# Patient Record
Sex: Male | Born: 1945
Health system: Southern US, Community
[De-identification: ages and names within clinical notes are randomized; demographics above are authoritative.]

## PROBLEM LIST (undated history)

## (undated) DIAGNOSIS — Z9889 Other specified postprocedural states: Secondary | ICD-10-CM

## (undated) DIAGNOSIS — Z7901 Long term (current) use of anticoagulants: Secondary | ICD-10-CM

## (undated) DIAGNOSIS — E785 Hyperlipidemia, unspecified: Secondary | ICD-10-CM

## (undated) DIAGNOSIS — M109 Gout, unspecified: Secondary | ICD-10-CM

## (undated) DIAGNOSIS — I4891 Unspecified atrial fibrillation: Secondary | ICD-10-CM

## (undated) DIAGNOSIS — I10 Essential (primary) hypertension: Secondary | ICD-10-CM

## (undated) DIAGNOSIS — H905 Unspecified sensorineural hearing loss: Secondary | ICD-10-CM

## (undated) DIAGNOSIS — F0280 Dementia in other diseases classified elsewhere without behavioral disturbance: Secondary | ICD-10-CM

## (undated) DIAGNOSIS — C9000 Multiple myeloma not having achieved remission: Principal | ICD-10-CM

## (undated) DIAGNOSIS — I2510 Atherosclerotic heart disease of native coronary artery without angina pectoris: Secondary | ICD-10-CM

## (undated) DIAGNOSIS — E119 Type 2 diabetes mellitus without complications: Secondary | ICD-10-CM

## (undated) DIAGNOSIS — F419 Anxiety disorder, unspecified: Secondary | ICD-10-CM

## (undated) DIAGNOSIS — K219 Gastro-esophageal reflux disease without esophagitis: Secondary | ICD-10-CM

## (undated) DIAGNOSIS — K449 Diaphragmatic hernia without obstruction or gangrene: Secondary | ICD-10-CM

## (undated) HISTORY — PX: RETINAL LASER PROCEDURE: SHX2339

## (undated) HISTORY — DX: Hyperlipidemia, unspecified: E78.5

## (undated) HISTORY — DX: Atherosclerotic heart disease of native coronary artery without angina pectoris: I25.10

## (undated) HISTORY — PX: COCHLEAR IMPLANT: SUR684

## (undated) HISTORY — PX: PACEMAKER PLACEMENT: SHX43

## (undated) HISTORY — DX: Diaphragmatic hernia without obstruction or gangrene: K44.9

## (undated) HISTORY — DX: Dementia in other diseases classified elsewhere, unspecified severity, without behavioral disturbance, psychotic disturbance, mood disturbance, and anxiety: F02.80

## (undated) HISTORY — PX: CATARACT EXTRACTION: SUR2

## (undated) HISTORY — DX: Type 2 diabetes mellitus without complications: E11.9

## (undated) HISTORY — DX: Anxiety disorder, unspecified: F41.9

## (undated) HISTORY — DX: Unspecified atrial fibrillation: I48.91

## (undated) HISTORY — DX: Other specified postprocedural states: Z98.890

## (undated) HISTORY — DX: Multiple myeloma not having achieved remission: C90.00

## (undated) HISTORY — DX: Gastro-esophageal reflux disease without esophagitis: K21.9

## (undated) HISTORY — DX: Unspecified sensorineural hearing loss: H90.5

## (undated) HISTORY — DX: Gout, unspecified: M10.9

## (undated) HISTORY — DX: Long term (current) use of anticoagulants: Z79.01

## (undated) HISTORY — PX: HEMORRHOID BANDING: SHX5850

## (undated) HISTORY — DX: Essential (primary) hypertension: I10

---

## 1990-03-14 HISTORY — PX: KNEE ARTHROSCOPY: SUR90

## 2000-04-28 ENCOUNTER — Encounter: Payer: Self-pay | Admitting: Internal Medicine

## 2000-04-28 ENCOUNTER — Ambulatory Visit (HOSPITAL_COMMUNITY): Admission: RE | Admit: 2000-04-28 | Discharge: 2000-04-29 | Payer: Self-pay | Admitting: Internal Medicine

## 2000-04-29 ENCOUNTER — Encounter: Payer: Self-pay | Admitting: Internal Medicine

## 2000-06-27 ENCOUNTER — Ambulatory Visit (HOSPITAL_COMMUNITY): Admission: RE | Admit: 2000-06-27 | Discharge: 2000-06-27 | Payer: Self-pay | Admitting: Cardiology

## 2000-06-29 ENCOUNTER — Ambulatory Visit (HOSPITAL_COMMUNITY): Admission: RE | Admit: 2000-06-29 | Discharge: 2000-06-29 | Payer: Self-pay | Admitting: Cardiology

## 2002-09-10 ENCOUNTER — Ambulatory Visit (HOSPITAL_COMMUNITY): Admission: RE | Admit: 2002-09-10 | Discharge: 2002-09-10 | Payer: Self-pay | Admitting: Cardiology

## 2004-02-11 ENCOUNTER — Ambulatory Visit: Payer: Self-pay

## 2004-03-04 ENCOUNTER — Ambulatory Visit: Payer: Self-pay

## 2004-04-03 ENCOUNTER — Ambulatory Visit: Payer: Self-pay | Admitting: Internal Medicine

## 2004-05-04 ENCOUNTER — Ambulatory Visit: Payer: Self-pay | Admitting: Internal Medicine

## 2004-06-21 ENCOUNTER — Ambulatory Visit: Payer: Self-pay | Admitting: Internal Medicine

## 2004-06-23 ENCOUNTER — Ambulatory Visit: Payer: Self-pay | Admitting: Cardiology

## 2004-06-28 ENCOUNTER — Ambulatory Visit: Payer: Self-pay | Admitting: Cardiology

## 2004-06-28 ENCOUNTER — Inpatient Hospital Stay (HOSPITAL_BASED_OUTPATIENT_CLINIC_OR_DEPARTMENT_OTHER): Admission: RE | Admit: 2004-06-28 | Discharge: 2004-06-28 | Payer: Self-pay | Admitting: Cardiology

## 2004-12-03 ENCOUNTER — Ambulatory Visit: Payer: Self-pay | Admitting: Cardiology

## 2004-12-09 ENCOUNTER — Ambulatory Visit: Payer: Self-pay | Admitting: Cardiology

## 2004-12-26 ENCOUNTER — Encounter: Payer: Self-pay | Admitting: Cardiology

## 2005-01-07 ENCOUNTER — Ambulatory Visit: Payer: Self-pay | Admitting: Internal Medicine

## 2005-02-17 ENCOUNTER — Ambulatory Visit: Payer: Self-pay | Admitting: Internal Medicine

## 2005-04-18 ENCOUNTER — Ambulatory Visit: Payer: Self-pay | Admitting: Internal Medicine

## 2005-05-13 ENCOUNTER — Ambulatory Visit: Payer: Self-pay | Admitting: Internal Medicine

## 2005-06-22 ENCOUNTER — Ambulatory Visit: Payer: Self-pay | Admitting: Internal Medicine

## 2005-08-24 ENCOUNTER — Ambulatory Visit: Payer: Self-pay | Admitting: Internal Medicine

## 2005-11-11 ENCOUNTER — Ambulatory Visit: Payer: Self-pay | Admitting: Internal Medicine

## 2005-12-19 ENCOUNTER — Ambulatory Visit: Payer: Self-pay | Admitting: Cardiology

## 2006-01-20 ENCOUNTER — Ambulatory Visit: Payer: Self-pay | Admitting: Internal Medicine

## 2006-03-06 ENCOUNTER — Ambulatory Visit: Payer: Self-pay | Admitting: Internal Medicine

## 2006-04-08 ENCOUNTER — Ambulatory Visit: Payer: Self-pay | Admitting: Internal Medicine

## 2006-05-28 ENCOUNTER — Ambulatory Visit: Payer: Self-pay | Admitting: Internal Medicine

## 2006-10-06 ENCOUNTER — Ambulatory Visit: Payer: Self-pay | Admitting: Cardiology

## 2006-10-15 ENCOUNTER — Ambulatory Visit: Payer: Self-pay | Admitting: Internal Medicine

## 2007-03-12 ENCOUNTER — Ambulatory Visit: Payer: Self-pay | Admitting: Internal Medicine

## 2007-06-15 ENCOUNTER — Ambulatory Visit: Payer: Self-pay | Admitting: Internal Medicine

## 2007-10-28 ENCOUNTER — Ambulatory Visit: Payer: Self-pay | Admitting: Internal Medicine

## 2008-02-01 ENCOUNTER — Ambulatory Visit: Payer: Self-pay | Admitting: Cardiology

## 2008-02-01 DIAGNOSIS — I2510 Atherosclerotic heart disease of native coronary artery without angina pectoris: Secondary | ICD-10-CM | POA: Insufficient documentation

## 2008-02-01 DIAGNOSIS — E119 Type 2 diabetes mellitus without complications: Secondary | ICD-10-CM | POA: Insufficient documentation

## 2008-02-01 DIAGNOSIS — Z950 Presence of cardiac pacemaker: Secondary | ICD-10-CM | POA: Insufficient documentation

## 2008-02-01 DIAGNOSIS — I4821 Permanent atrial fibrillation: Secondary | ICD-10-CM | POA: Insufficient documentation

## 2008-02-03 ENCOUNTER — Encounter: Payer: Self-pay | Admitting: Cardiology

## 2008-06-18 ENCOUNTER — Ambulatory Visit: Payer: Self-pay | Admitting: Internal Medicine

## 2008-09-18 ENCOUNTER — Encounter: Payer: Self-pay | Admitting: Internal Medicine

## 2008-09-18 ENCOUNTER — Ambulatory Visit: Payer: Self-pay | Admitting: Internal Medicine

## 2008-12-18 ENCOUNTER — Ambulatory Visit: Payer: Self-pay | Admitting: Internal Medicine

## 2009-03-19 ENCOUNTER — Encounter: Payer: Self-pay | Admitting: Internal Medicine

## 2009-05-15 ENCOUNTER — Telehealth (INDEPENDENT_AMBULATORY_CARE_PROVIDER_SITE_OTHER): Payer: Self-pay | Admitting: *Deleted

## 2009-06-08 ENCOUNTER — Encounter: Payer: Self-pay | Admitting: Cardiology

## 2009-06-09 ENCOUNTER — Ambulatory Visit: Payer: Self-pay | Admitting: Cardiology

## 2009-06-09 DIAGNOSIS — R072 Precordial pain: Secondary | ICD-10-CM

## 2009-06-09 DIAGNOSIS — R42 Dizziness and giddiness: Secondary | ICD-10-CM

## 2009-06-09 DIAGNOSIS — R0602 Shortness of breath: Secondary | ICD-10-CM

## 2009-06-15 ENCOUNTER — Encounter: Payer: Self-pay | Admitting: Cardiology

## 2009-06-15 ENCOUNTER — Ambulatory Visit: Payer: Self-pay | Admitting: Cardiology

## 2009-06-18 ENCOUNTER — Ambulatory Visit: Payer: Self-pay | Admitting: Internal Medicine

## 2009-06-26 ENCOUNTER — Encounter (INDEPENDENT_AMBULATORY_CARE_PROVIDER_SITE_OTHER): Payer: Self-pay | Admitting: *Deleted

## 2009-09-15 ENCOUNTER — Encounter: Payer: Self-pay | Admitting: Cardiology

## 2009-09-17 ENCOUNTER — Ambulatory Visit: Payer: Self-pay | Admitting: Internal Medicine

## 2009-12-09 ENCOUNTER — Ambulatory Visit: Payer: Self-pay | Admitting: Cardiology

## 2009-12-17 ENCOUNTER — Ambulatory Visit: Payer: Self-pay | Admitting: Internal Medicine

## 2010-01-18 ENCOUNTER — Telehealth (INDEPENDENT_AMBULATORY_CARE_PROVIDER_SITE_OTHER): Payer: Self-pay | Admitting: *Deleted

## 2010-03-05 ENCOUNTER — Encounter: Payer: Self-pay | Admitting: Cardiology

## 2010-03-08 ENCOUNTER — Encounter: Payer: Self-pay | Admitting: Internal Medicine

## 2010-03-09 ENCOUNTER — Encounter (INDEPENDENT_AMBULATORY_CARE_PROVIDER_SITE_OTHER): Payer: Self-pay | Admitting: *Deleted

## 2010-03-18 ENCOUNTER — Encounter: Payer: Self-pay | Admitting: Internal Medicine

## 2010-03-24 ENCOUNTER — Encounter: Payer: Self-pay | Admitting: Cardiology

## 2010-03-25 ENCOUNTER — Encounter: Payer: Self-pay | Admitting: Cardiology

## 2010-03-25 ENCOUNTER — Ambulatory Visit
Admission: RE | Admit: 2010-03-25 | Discharge: 2010-03-25 | Payer: Self-pay | Source: Home / Self Care | Attending: Cardiology | Admitting: Cardiology

## 2010-03-25 ENCOUNTER — Encounter (INDEPENDENT_AMBULATORY_CARE_PROVIDER_SITE_OTHER): Payer: Self-pay | Admitting: *Deleted

## 2010-03-25 DIAGNOSIS — R079 Chest pain, unspecified: Secondary | ICD-10-CM | POA: Insufficient documentation

## 2010-03-26 ENCOUNTER — Encounter: Payer: Self-pay | Admitting: Cardiology

## 2010-03-29 ENCOUNTER — Encounter: Payer: Self-pay | Admitting: Cardiology

## 2010-03-30 ENCOUNTER — Encounter: Payer: Self-pay | Admitting: Cardiology

## 2010-03-30 ENCOUNTER — Ambulatory Visit
Admission: RE | Admit: 2010-03-30 | Discharge: 2010-03-30 | Payer: Self-pay | Source: Home / Self Care | Attending: Cardiovascular Disease | Admitting: Cardiovascular Disease

## 2010-04-02 ENCOUNTER — Ambulatory Visit: Payer: Self-pay | Admitting: Internal Medicine

## 2010-04-05 LAB — GLUCOSE, POCT (MANUAL RESULT ENTRY)
Glucose, Bld: 238 mg/dL — ABNORMAL HIGH (ref 70–99)
Operator id: 141321

## 2010-04-07 NOTE — Procedures (Addendum)
Jay Campbell, JAKE NO.:  [id number]  MEDICAL RECORD NO.:  [id number]          PATIENT TYPE:  OIB  LOCATION:  1963                         FACILITY:  [facility]  PHYSICIAN:  Verne Carrow, MDDATE OF BIRTH:  12-05-1945  DATE OF PROCEDURE:  03/30/2010 DATE OF DISCHARGE:                           CARDIAC CATHETERIZATION   PRIMARY CARDIOLOGIST:  Learta Codding, MD, [facility]  PRIMARY CARE PHYSICIAN:  Kirstie Peri, MD  PROCEDURES PERFORMED: 1. Left heart catheterization. 2. Selective coronary angiography. 3. Left ventricular angiogram.  OPERATOR:  Verne Carrow, MD  INDICATIONS:  This is a 65 year old Caucasian male with a history of mild nonobstructive coronary artery disease by cath in 2006 who also has a history of diabetes mellitus, atrial fibrillation, and hypertension. The patient has had recent complaints of chest discomfort with exertion. He was seen by Dr. Andee Lineman in the office and an outpatient cardiac catheterization was arranged for today.  As stated before, the patient most recently underwent a cardiac catheterization in 2006 by Dr. Antoine Poche at which time he was found to have a 40% lesion in the proximal LAD with mild luminal irregularities in the other vessels.  PROCEDURE IN DETAIL:  The patient was brought to the outpatient cardiac catheterization laboratory after signing informed consent for the procedure.  The right groin was prepped and draped in sterile fashion. Lidocaine 1% was used for local anesthesia.  A 4-French sheath was inserted into the right femoral artery without difficulty.  We initially engaged the left main coronary artery with a 4-French JL-4 catheter.  I was unable to adequately fill the left anterior descending artery with this catheter.  We upsized the sheath to a 5-French sheath and then used a 5-French JL-4 catheter to selectively engage the left main artery. Selective angiography of the left coronary system was then  performed.  A 3DRC catheter was used to selectively engage and inject the native right coronary artery.  A pigtail catheter was used to perform a left ventricular angiogram.  The patient tolerated the procedure well.  He was taken to recovery area in stable condition.  HEMODYNAMIC FINDINGS:  Central aortic pressure 122/58.  Left ventricular pressure 124/7.  Left ventricular end-diastolic pressure 13.  ANGIOGRAPHIC FINDINGS: 1. The left main coronary artery had no obstructive disease. 2. The left anterior descending is a moderate-sized vessel in its     proximal and midportion.  There appears to be a 40% stenosis in the     proximal left anterior descending artery that is unchanged from     previous catheterizations.  First diagonal branch is small in     caliber and has mild plaque disease.  The mid and distal left     anterior descending artery tapers to a small-caliber vessel. 3. Circumflex artery gives off a very small-caliber first obtuse     marginal branch and then moderate-sized second and third obtuse    marginal branches.  There are mild luminal irregularities     throughout the circumflex system but no focally obstructive     lesions.  The AV groove circumflex branch has mild plaque. 4. The right coronary artery  is a large dominant vessel with mild     luminal irregularities in the proximal midportion of the vessel.     There are no obstructive lesions noted in this vessel. 5. Left ventricular angiogram was performed in the RAO projection and     showed normal left ventricular systolic function with ejection     fraction of 55-60%.  IMPRESSION: 1. Mild nonobstructive coronary artery disease. 2. Normal left ventricular systolic function. 3. Possible micro vessel disease causing the patient's complaints of     exertional chest pain.  RECOMMENDATIONS:  At this time, I would recommend continued medical management as there are no focally obstructive lesions that would require  percutaneous coronary intervention.     Verne Carrow, MD     CM/MEDQ  D:  03/30/2010  T:  03/30/2010  Job:  [id number]  cc:   Learta Codding, MD,FACC Kirstie Peri, MD  Electronically Signed by Verne Carrow MD on 04/07/2010 04:10:43 PM

## 2010-04-13 NOTE — Progress Notes (Signed)
Summary: sertraline update  ---- Converted from flag ---- ---- 01/11/2010 3:35 PM, Lewayne Bunting, MD, [facility] wrote: Do we still need to do this?  ---- 12/09/2009 1:37 PM, Hoover Brunette, LPN wrote: Decreased his Sertraline to 25mg  daily x 2 weeks.  Will need to contact pt to switch to Prozac to wean off SSRI. ------------------------------  Phone Note Outgoing Call   Summary of Call: Spoke with wife and states that he did stop, but had to restart.  Became alot more irritable off med.  States he does have OV for a physical scheduled for Friday, 11/11 with Dan Maker at [facility].  He plans to discuss at that time.   Hoover Brunette, LPN  January 18, 2010 10:53 AM   Follow-up for Phone Call        OK Follow-up by: Lewayne Bunting, MD, [facility],  January 18, 2010 1:01 PM

## 2010-04-13 NOTE — Assessment & Plan Note (Signed)
Summary: f/u not seen since 01/2008 [place]   Visit Type:  Follow-up Primary Provider:  Dr. Sherryll Burger  CC:  chest pain.  History of Present Illness: the patient is a 65 year old male with history of nonobstructive coronary artery disease by catheterization 2002. The patient also says post AV nodal ablation, atrial flutter status post pacemaker implantation. He has normal LV function. Is on chronic Coumadin therapy.  The patient states that it is a sharp pain when bending forward. He also developed shortness of breath in doing so. He denies however any exertional chest pain or shortness of breath. On occasion uses nitroglycerin but rarely. Sometimes when he gets up too fast he feels real dizzy. He denies any orthopnea PND palpitations or syncope. He does have a ventral hernia which seems to be causing problems when bending forward.  Current Medications (verified): 1)  Isosorbide Mononitrate Cr 60 Mg Xr24h-Tab (Isosorbide Mononitrate) .... Take 1 Tablet By Mouth Once A Day 2)  Nitrostat 0.4 Mg Subl (Nitroglycerin) .... Place 1 Tablet Under Tongue As Directed 3)  Warfarin Sodium 5 Mg Tabs (Warfarin Sodium) .... Take 1 Tablet By Mouth As Directed 4)  Metformin Hcl 1000 Mg Tabs (Metformin Hcl) .... Take 1 Tab Two Times A Day 5)  Ramipril 5 Mg Caps (Ramipril) .... Take 1 Tab Daily 6)  Glyburide 5 Mg Tabs (Glyburide) .... Take 1 Tab Daily 7)  Ra Fish Oil 1000 Mg Caps (Omega-3 Fatty Acids) .... Take 1 Cap Two Times A Day 8)  Aspir-Low 81 Mg Tbec (Aspirin) .... Take 1 Tab Daily 9)  Sertraline Hcl 50 Mg Tabs (Sertraline Hcl) .... Take 1 Tab Daily 10)  Actos 45 Mg Tabs (Pioglitazone Hcl) .... Take 1 Tab Daily 11)  Simvastatin 40 Mg Tabs (Simvastatin) .... Take 1 Tab Daily  Allergies (verified): 1)  ! * Shellfish 2)  * Sulfa Drugs  Past History:  Past Medical History: Last updated: 02/01/2008 chronic atrial fibrillation/atrial flutter warfarin anticoagulation secondary to number one status post AV  nodal ablation status post permanent pacemaker implantation history of chronic dyspnea but resolved.  History of chronic exertional chest pain, resolved history of lower extremity edema hiatal hernia  Family History: Last updated: 02/01/2008 noncontributory  Social History: Last updated: 02/01/2008 the patient does not smoke or drink  Risk Factors: Smoking Status: never (02/01/2008)  Review of Systems       The patient complains of chest pain, shortness of breath, and dizziness.  The patient denies fatigue, malaise, fever, weight gain/loss, vision loss, decreased hearing, hoarseness, palpitations, prolonged cough, wheezing, sleep apnea, coughing up blood, abdominal pain, blood in stool, nausea, vomiting, diarrhea, heartburn, incontinence, blood in urine, muscle weakness, joint pain, leg swelling, rash, skin lesions, headache, fainting, depression, anxiety, enlarged lymph nodes, easy bruising or bleeding, and environmental allergies.    Vital Signs:  Patient profile:   65 year old male Height:      72 inches Weight:      221 pounds BMI:     30.08 Pulse rate:   221 / minute Pulse (ortho):   60 / minute BP sitting:   110 / 67  (right arm) BP standing:   111 / 70  Vitals Entered By: Dreama Saa, CNA (June 09, 2009 3:04 PM)  Serial Vital Signs/Assessments:  Time      Position  BP       Pulse  Resp  Temp     By 3:17 PM   Lying RA  116/73   60  Dreama Saa, CNA 3:17 PM   Sitting   113/73   [address] CNA 3:17 PM   Standing  111/70   60                    [address], CNA   Physical Exam  Additional Exam:  General: Well-developed, well-nourished in no distress head: Normocephalic and atraumatic eyes PERRLA/EOMI intact, conjunctiva and lids normal nose: No deformity or lesions mouth normal dentition, normal posterior pharynx neck: Supple, no JVD.  No masses, thyromegaly or abnormal cervical nodes lungs: Normal breath sounds  bilaterally without wheezing.  Normal percussion heart: regular rate and rhythm with normal S1 and S2, no S3 or S4.  PMI is normal.  No pathological murmurs abdomen: Normal bowel sounds, abdomen is soft and nontender without masses, organomegaly or hernias noted.  No hepatosplenomegaly musculoskeletal: Back normal, normal gait muscle strength and tone normal pulsus: Pulse is normal in all 4 extremities Extremities: No peripheral pitting edema neurologic: Alert and oriented x 3 skin: Intact without lesions or rashes cervical nodes: No significant adenopathy psychologic: Normal affect    EKG  Procedure date:  06/09/2009  Findings:      underlying atrial fibrillation. Ventricular pacemaker heart rate 60 beats per minute.  Impression & Recommendations:  Problem # 1:  DIZZINESS AND GIDDINESS (ICD-780.4) suspect the patient may have some degree of dysautonomia in setting of his diabetes mellitus. He also has mild orthostasis by blood pressure. We will discontinue Lasix.  Problem # 2:  PRECORDIAL PAIN (ICD-786.51) atypical but the patient has not had an ischemia evaluation in many years. Hehas nonobstructive coronary artery disease but small vessel disease.he has responded to nitrates in the past. I suspect his chest pain is related to his ventral hernia and bending forward. I ordered a Cardiolite stress test. The following medications were removed from the medication list:    Norvasc 10 Mg Tabs (Amlodipine besylate) His updated medication list for this problem includes:    Isosorbide Mononitrate Cr 60 Mg Xr24h-tab (Isosorbide mononitrate) .Marland Kitchen... Take 1 tablet by mouth once a day    Nitrostat 0.4 Mg Subl (Nitroglycerin) .Marland Kitchen... Place 1 tablet under tongue as directed    Warfarin Sodium 5 Mg Tabs (Warfarin sodium) .Marland Kitchen... Take 1 tablet by mouth as directed    Ramipril 5 Mg Caps (Ramipril) .Marland Kitchen... Take 1 tab daily    Aspir-low 81 Mg Tbec (Aspirin) .Marland Kitchen... Take 1 tab daily  Orders: Nuclear Med (Nuc  Med)  Problem # 3:  CARDIAC PACEMAKER IN SITU (ICD-V45.01) Assessment: Comment Only  Problem # 4:  ATRIAL FLUTTER (ICD-427.32) the patient remains on Coumadin. Status post AV nodal ablation. His updated medication list for this problem includes:    Warfarin Sodium 5 Mg Tabs (Warfarin sodium) .Marland Kitchen... Take 1 tablet by mouth as directed    Aspir-low 81 Mg Tbec (Aspirin) .Marland Kitchen... Take 1 tab daily  Problem # 5:  COUMADIN THERAPY (ICD-V58.61) Assessment: Comment Only  Patient Instructions: 1)  Stop Lasix  2)  Lexiscan stress test 3)  Follow up in  6 months

## 2010-04-13 NOTE — Progress Notes (Signed)
Summary: RX REFILL ISOSORBIDE  Medications Added ISOSORBIDE MONONITRATE CR 60 MG XR24H-TAB (ISOSORBIDE MONONITRATE) Take 1 tablet by mouth once a day NITROSTAT 0.4 MG SUBL (NITROGLYCERIN) Place 1 tablet under tongue as directed NORVASC 10 MG TABS (AMLODIPINE BESYLATE)  WARFARIN SODIUM 5 MG TABS (WARFARIN SODIUM) Take 1 tablet by mouth as directed      informed patient verbally via phone that ov needed.      New/Updated Medications: ISOSORBIDE MONONITRATE CR 60 MG XR24H-TAB (ISOSORBIDE MONONITRATE) Take 1 tablet by mouth once a day NITROSTAT 0.4 MG SUBL (NITROGLYCERIN) Place 1 tablet under tongue as directed NORVASC 10 MG TABS (AMLODIPINE BESYLATE)  WARFARIN SODIUM 5 MG TABS (WARFARIN SODIUM) Take 1 tablet by mouth as directed Prescriptions: ISOSORBIDE MONONITRATE CR 60 MG XR24H-TAB (ISOSORBIDE MONONITRATE) Take 1 tablet by mouth once a day  #30 x 0   Entered by:   Carlye Grippe   Authorized by:   Lewayne Bunting, MD, [facility]   Signed by:   Carlye Grippe on 05/15/2009   Method used:   Electronically to        [facility]* (retail)       [address]       [place], [place]  [address]       Ph: [phone]       Fax: [phone]   RxID:   [id number]

## 2010-04-13 NOTE — Letter (Signed)
Summary: Engineer, materials at [facility]  [address]   [place], [place] [address]   Phone: [phone]  Fax: [phone]        June 26, 2009 MRN: [id number]   Jay Campbell [address] [place], [place]  [address]   Dear Mr. AMOS,  Your test ordered by Selena Batten has been reviewed by your physician (or physician assistant) and was found to be normal or stable. Your physician (or physician assistant) felt no changes were needed at this time.  ____ Echocardiogram  __X__ Cardiac Stress Test  ____ Lab Work  ____ Peripheral vascular study of arms, legs or neck  ____ CT scan or X-ray  ____ Lung or Breathing test  ____ Other:   Thank you.   Hoover Brunette, LPN    Duane Boston, M.D., F.A.C.C. Thressa Sheller, M.D., F.A.C.C. Oneal Grout, M.D., F.A.C.C. Cheree Ditto, M.D., F.A.C.C. Daiva Nakayama, M.D., F.A.C.C. Kenney Houseman, M.D., F.A.C.C. Jeanne Ivan, PA-C

## 2010-04-13 NOTE — Cardiovascular Report (Signed)
Summary: TTM   TTM   Imported By: Roderic Ovens 10/01/2009 16:10:43  _____________________________________________________________________  External Attachment:    Type:   Image     Comment:   External Document

## 2010-04-13 NOTE — Assessment & Plan Note (Signed)
Summary: 6 MO FU PER SEPT REMINDER-SRS   Visit Type:  Follow-up Primary Provider:  Dr. Sherryll Burger   History of Present Illness: the patient is a 65 year old male with a history of atrial flutter status post AV nodal ablation and nonobstructive coronary artery disease. His stress test earlier this year which was within normal limits. He status post cardiac pacemaker.  The patient is doing well. He denies any chest pain shortness of breath orthopnea PND he has no palpitations or syncope. He did quite well from a cardiovascular standpoint.  The patient reports that he has difficulty with his CPAP device. Initially he used a nasal device and now has been switched to a full face mask. However the patient has frequent violent dreams and finds himself next to the bed. He does not report any depression any more and I suggested that he may want to think about weaning his sertraline. He may also need to follow up with neurology for adjustments of his face mask and assess compliance.  Preventive Screening-Counseling & Management  Alcohol-Tobacco     Smoking Status: never  Current Medications (verified): 1)  Isosorbide Mononitrate Cr 60 Mg Xr24h-Tab (Isosorbide Mononitrate) .... Take 1 Tablet By Mouth Once A Day 2)  Nitrostat 0.4 Mg Subl (Nitroglycerin) .... Place 1 Tablet Under Tongue As Directed 3)  Warfarin Sodium 5 Mg Tabs (Warfarin Sodium) .... Take 1 Tablet By Mouth As Directed 4)  Metformin Hcl 1000 Mg Tabs (Metformin Hcl) .... Take 1 Tab Two Times A Day 5)  Ramipril 5 Mg Caps (Ramipril) .... Take 1 Tab Daily 6)  Glyburide 5 Mg Tabs (Glyburide) .... Take 1 Tab Daily 7)  Ra Fish Oil 1000 Mg Caps (Omega-3 Fatty Acids) .... Take 1 Cap Two Times A Day 8)  Aspir-Low 81 Mg Tbec (Aspirin) .... Take 1 Tab Daily 9)  Sertraline Hcl 50 Mg Tabs (Sertraline Hcl) .... Take 1/2 Tab (25mg ) Daily 10)  Actos 45 Mg Tabs (Pioglitazone Hcl) .... Take 1 Tab Daily 11)  Simvastatin 40 Mg Tabs (Simvastatin) .... Take 1  Tab Daily  Allergies (verified): 1)  ! * Shellfish 2)  * Sulfa Drugs  Comments:  Nurse/Medical Assistant: The patient is currently on medications but does not know the name or dosage at this time. Instructed to contact our office with details. Will update medication list at that time.  Past History:  Past Medical History: Last updated: 02/01/2008 chronic atrial fibrillation/atrial flutter warfarin anticoagulation secondary to number one status post AV nodal ablation status post permanent pacemaker implantation history of chronic dyspnea but resolved.  History of chronic exertional chest pain, resolved history of lower extremity edema hiatal hernia  Family History: Last updated: 02/01/2008 noncontributory  Social History: Last updated: 02/01/2008 the patient does not smoke or drink  Risk Factors: Smoking Status: never (12/09/2009)  Vital Signs:  Patient profile:   65 year old male Height:      72 inches Weight:      224 pounds Pulse rate:   68 / minute BP sitting:   149 / 78  (left arm) Cuff size:   large  Vitals Entered By: Carlye Grippe (December 09, 2009 1:06 PM)  Physical Exam  Additional Exam:  General: Well-developed, well-nourished in no distress head: Normocephalic and atraumatic eyes PERRLA/EOMI intact, conjunctiva and lids normal nose: No deformity or lesions mouth normal dentition, normal posterior pharynx neck: Supple, no JVD.  No masses, thyromegaly or abnormal cervical nodes lungs: Normal breath sounds bilaterally without wheezing.  Normal percussion  heart: regular rate and rhythm with normal S1 and S2, no S3 or S4.  PMI is normal.  No pathological murmurs abdomen: Normal bowel sounds, abdomen is soft and nontender without masses, organomegaly or hernias noted.  No hepatosplenomegaly musculoskeletal: Back normal, normal gait muscle strength and tone normal pulsus: Pulse is normal in all 4 extremities Extremities: No peripheral pitting  edema neurologic: Alert and oriented x 3 skin: Intact without lesions or rashes cervical nodes: No significant adenopathy psychologic: Normal affect    Impression & Recommendations:  Problem # 1:  SHORTNESS OF BREATH (ICD-786.05) resolved. Negative Cardiolite stress study. His updated medication list for this problem includes:    Ramipril 5 Mg Caps (Ramipril) .Marland Kitchen... Take 1 tab daily    Aspir-low 81 Mg Tbec (Aspirin) .Marland Kitchen... Take 1 tab daily  Problem # 2:  COUMADIN THERAPY (ICD-V58.61) patient has a history of chronic atrial fibrillation status post AV nodal ablation. Continue Coumadin therapy  Problem # 3:  CARDIAC PACEMAKER IN SITU (ICD-V45.01) the patient receives routine pacemaker follow. No pacemaker malfunction is noted.  Patient Instructions: 1)  Decrease Sertraline to 25mg  daily x 2 weeks 2)  Will contact patient to switch to Prozac to wean off SSRI 3)  Follow up in  1 year

## 2010-04-13 NOTE — Cardiovascular Report (Signed)
Summary: TTM   TTM   Imported By: Roderic Ovens 12/29/2009 11:06:49  _____________________________________________________________________  External Attachment:    Type:   Image     Comment:   External Document

## 2010-04-13 NOTE — Cardiovascular Report (Signed)
Summary: TTM   TTM   Imported By: Roderic Ovens 08/06/2009 11:55:38  _____________________________________________________________________  External Attachment:    Type:   Image     Comment:   External Document

## 2010-04-13 NOTE — Cardiovascular Report (Signed)
Summary: TTM   TTM   Imported By: Roderic Ovens 03/31/2009 14:42:15  _____________________________________________________________________  External Attachment:    Type:   Image     Comment:   External Document

## 2010-04-15 NOTE — Letter (Signed)
Summary: Internal Correspondence/ FAXED PRE-CATH ORDER  Internal Correspondence/ FAXED PRE-CATH ORDER   Imported By: Dorise Hiss 03/30/2010 12:15:34  _____________________________________________________________________  External Attachment:    Type:   Image     Comment:   External Document

## 2010-04-15 NOTE — Miscellaneous (Signed)
Summary: rx - prednisone  Clinical Lists Changes  Medications: Added new medication of PREDNISONE 20 MG TABS (PREDNISONE) take 3 tabs (60mg ) at 6:00p.m., 12 midnight, and 6:00a.m. of procedure - Signed Rx of PREDNISONE 20 MG TABS (PREDNISONE) take 3 tabs (60mg ) at 6:00p.m., 12 midnight, and 6:00a.m. of procedure;  #9 x 0;  Signed;  Entered by: Hoover Brunette, LPN;  Authorized by: Lewayne Bunting, MD, [facility];  Method used: Electronically to [facility]*, [address], [place], [place], [place]  [address], Ph: [phone], Fax: [phone]    Prescriptions: PREDNISONE 20 MG TABS (PREDNISONE) take 3 tabs (60mg ) at 6:00p.m., 12 midnight, and 6:00a.m. of procedure  #9 x 0   Entered by:   Hoover Brunette, LPN   Authorized by:   Lewayne Bunting, MD, [facility]   Signed by:   Hoover Brunette, LPN on 13/10/6576   Method used:   Electronically to        [facility]* (retail)       [address]       [place], [place]  [address]       Ph: [phone]       Fax: [phone]   RxID:   [id number]

## 2010-04-15 NOTE — Cardiovascular Report (Signed)
Summary: TTM   TTM   Imported By: Roderic Ovens 04/01/2010 16:29:49  _____________________________________________________________________  External Attachment:    Type:   Image     Comment:   External Document

## 2010-04-15 NOTE — Letter (Signed)
Summary: External Correspondence/ OFFICE VISIT [facility]  External Correspondence/ OFFICE VISIT [facility]   Imported By: Dorise Hiss 03/25/2010 09:09:32  _____________________________________________________________________  External Attachment:    Type:   Image     Comment:   External Document

## 2010-04-15 NOTE — Cardiovascular Report (Signed)
Summary: Certified Letter Signed - Patient (not doing f/u)  Certified Letter Signed - Patient (not doing f/u)   Imported By: Debby Freiberg 03/31/2010 17:12:09  _____________________________________________________________________  External Attachment:    Type:   Image     Comment:   External Document

## 2010-04-15 NOTE — Letter (Signed)
Summary: Cardiac Cath Instructions - [facility]   HeartCare at [facility] [address]   [place], [place] [address]   Phone: [phone]  Fax: [phone]     03/25/2010 MRN: [id number]  Jay Campbell [address] [place], [place]  [address]  Dear [name],   You are scheduled for a Cardiac Catheterization on___________________  with Dr._____________  Please arrive to the 1st floor of the Heart and Vascular Center at [facility] at_______  am / pm on the day of your procedure. Please do not arrive before 6:30 a.m. Call the Heart and Vascular Center at [phone] if you are unable to make your appointmnet. The Code to get into the parking garage under the building is 0030 . Take the elevators to the 1st floor. You must have someone to drive you home. Someone must be with you for the first 24 hours after you arrive home. Please wear clothes that are easy to get on and off and wear slip-on shoes. Do not eat or drink after midnight except water with your medications that morning. Bring all your medications and current insurance cards with you.  _X__ DO NOT take these medications before your procedure: hold 24 hours before cath and 48 hours after, Glyburide - morning of only  _X__ Make sure you take your aspirin.  ___ You may take ALL of your medications with water that morning.  ___ DO NOT take ANY medications before your procedure.  _X__ Pre-med instructions:  see attached sheet  The usual length of stay after your procedure is 2 to 3 hours. This can vary.  If you have any questions, please call the office at the number listed above.  Hoover Brunette, LPN                Directions to the [facility]  Please Note : Park in [place] under the building not the parking deck.  From [employer]: Turn onto [address] Left onto [address] (1st stoplight) Right at the brick entrance to the hospital (Main circle  drive) Bear to the right and you will see a blue sign "Heart and Vascular Center" Parking garage is a sharp right'to get through the gate out in the code _______. Once you park, take the elevator to the first floor. Please do not arrive before 0630am. The building will be dark before that time.   From [address] Turn onto [address] Turn left into the brick entrance to the hospital (Main circle drive) Bear to the right and you will see a blue sign "[facility] V[facility] Center" Parking garage is a sharp right, to get thru the gate put in the code ____. Once you park, take the elevator to the first floor. Please do not arrive before 0630am. The building will be dark before that time

## 2010-04-15 NOTE — Letter (Signed)
Summary: Device-Delinquent Check  [facility], Main Office  [address]   [place], [place] [address]   Phone: [phone]  Fax: [phone]     March 09, 2010 MRN: [id number]   Jay Campbell [address] [place], [place]  [address]   Dear [name],  According to our records, you have not had your implanted device checked in the recommended period of time.  We are unable to determine appropriate device function without checking your device on a regular basis.  Please call our office to schedule an appointment as soon as possible.  If you are having your device checked by another physician, please call us so that we may update our records.  Thank you,  Altha Harm, LPN  March 09, 2010 4:44 PM  [facility]  certified mail

## 2010-04-15 NOTE — Assessment & Plan Note (Signed)
Summary: ROV-CHEST PAIN PER DR. [facility]   Visit Type:  Follow-up Primary Provider:  Dr. Sherryll Burger   History of Present Illness: the patient is a 65 year old male with a history of chronic atrial fibrillation, status post AV nodal ablation and permanent pacemaker implantation. The patient has no history of coronary artery disease. His last catheterization which was his only catheterization was approximately in 2002.  The patient has now been referred by his primary care physician because of several episodes of chest pain that occurred recently. The patient's symptoms started after Christmas. He was deer hunting and when he was running from one stands to another approximately a couple mile he started to deer. He kept tracking them, showed one of the tear and then started running again. During this period he developed severe substernal chest pain with pain radiating to the left shoulder. He was very dyspneic as well as very diaphoretic. He started getting dizzy and started stumbling. He stumbled over a small tree that was taken down and had to roll over it. The patient also reports that in October after his foot got caught in a ditch he fell forward landed on his chest and face and eye she landed on the left side of his chest where his pacemaker he is.he denies any true loss of consciousness. He has had several falls in the last month with these were all accidental. He also reports occasionally falling out of bed with a CPAP device on.  Since the episode that occurred at Christmas he continues to have substernal chest pain and takes almost daily nitroglycerin. The other day when he was using his power drill he had some more severe chest pain and took several nitroglycerin he has associated shortness of breath and diaphoresis.  The patient was seen in the interim ice primary care physician and was started on metoprolol. Has been referred for further evaluation. He did have a Cardiolite stress test on now almost 2  years ago. Study was negative.  Preventive Screening-Counseling & Management  Alcohol-Tobacco     Smoking Status: never  Current Medications (verified): 1)  Imdur 60 Mg Xr24h-Tab (Isosorbide Mononitrate) .... Take 1 1/2 Tabs (90mg ) Daily 2)  Nitrostat 0.4 Mg Subl (Nitroglycerin) .... Dissolve One Tablet Under Tongue For Severe Chest Pain As Needed Every 5 Minutes, Not To Exceed 3 in 15 Min Time Frame 3)  Warfarin Sodium 5 Mg Tabs (Warfarin Sodium) .... Take 1 Tablet By Mouth As Directed 4)  Metformin Hcl 1000 Mg Tabs (Metformin Hcl) .... Take 1 Tab Two Times A Day 5)  Ramipril 5 Mg Caps (Ramipril) .... Take 1 Tab Daily 6)  Glyburide 5 Mg Tabs (Glyburide) .... Take 1/2 Tablet By Mouth Two Times A Day 7)  Ra Fish Oil 1000 Mg Caps (Omega-3 Fatty Acids) .... Take 1 Cap Two Times A Day 8)  Aspir-Low 81 Mg Tbec (Aspirin) .... Take 1 Tab Daily 9)  Sertraline Hcl 50 Mg Tabs (Sertraline Hcl) .... Take 1 Tablet By Mouth Once A Day 10)  Actos 30 Mg Tabs (Pioglitazone Hcl) .... Take 1 Tablet By Mouth Once A Day 11)  Simvastatin 40 Mg Tabs (Simvastatin) .... Take 1 Tab Daily 12)  Vitamin D 1000 Unit Tabs (Cholecalciferol) .... Take 1 Tablet By Mouth Once A Day 13)  Multivitamins  Tabs (Multiple Vitamin) .... Take 1 Tablet By Mouth Once A Day 14)  Alprazolam 0.5 Mg Tabs (Alprazolam) .... Take 1 Tablet By Mouth Once A Day At Bedtime  Allergies (verified): 1)  ! *  Shellfish 2)  * Sulfa Drugs  Comments:  Nurse/Medical Assistant: The patient's medication list and allergies were reviewed with the patient and were updated in the Medication and Allergy Lists.  Past History:  Risk Factors: Smoking Status: never (03/25/2010)  Past Medical History: Reviewed history from 02/01/2008 and no changes required. chronic atrial fibrillation/atrial flutter warfarin anticoagulation secondary to number one status post AV nodal ablation status post permanent pacemaker implantation history of chronic dyspnea  but resolved.  History of chronic exertional chest pain, resolved history of lower extremity edema hiatal hernia  Family History: Reviewed history from 02/01/2008 and no changes required. Negative FH of Diabetes, Hypertension, or Coronary Artery Disease  Social History: Reviewed history from 02/01/2008 and no changes required. the patient does not smoke or drink  Review of Systems       The patient complains of chest pain, dizziness, and anxiety.  The patient denies fatigue, malaise, fever, weight gain/loss, vision loss, decreased hearing, hoarseness, palpitations, shortness of breath, prolonged cough, wheezing, sleep apnea, coughing up blood, abdominal pain, blood in stool, nausea, vomiting, diarrhea, heartburn, incontinence, blood in urine, muscle weakness, joint pain, leg swelling, rash, skin lesions, headache, fainting, depression, enlarged lymph nodes, easy bruising or bleeding, and environmental allergies.    Vital Signs:  Patient profile:   65 year old male Height:      72 inches Weight:      225 pounds BMI:     30.63 Pulse rate:   73 / minute BP sitting:   126 / 76  (left arm) Cuff size:   large  Vitals Entered By: Carlye Grippe (March 25, 2010 10:11 AM)  Nutrition Counseling: Patient's BMI is greater than 25 and therefore counseled on weight management options.  Physical Exam  Additional Exam:  General: Well-developed, well-nourished in no distress head: Normocephalic and atraumatic eyes PERRLA/EOMI intact, conjunctiva and lids normal nose: No deformity or lesions mouth normal dentition, normal posterior pharynx neck: Supple, no JVD.  No masses, thyromegaly or abnormal cervical nodes lungs: Normal breath sounds bilaterally without wheezing.  Normal percussion heart: regular rate and rhythm with normal S1 and S2, no S3 or S4.  PMI is normal.  No pathological murmurs abdomen: Normal bowel sounds, abdomen is soft and nontender without masses, organomegaly or hernias  noted.  No hepatosplenomegaly musculoskeletal: Back normal, normal gait muscle strength and tone normal pulsus: Pulse is normal in all 4 extremities Extremities: No peripheral pitting edema neurologic: Alert and oriented x 3 skin: Intact without lesions or rashes, several chigger bites cervical nodes: No significant adenopathy psychologic: Normal affect    PPM Specifications Following MD:  Hillis Range, MD     PPM Vendor:  St Jude     PPM Model Number:  [id number]     PPM Serial Number:  [id number] DOI:  04/28/2000     PPM Implanting MD:  Lewayne Bunting, MD  Lead 1    Location: RV     DOI: 04/28/2000     Model #: [id number]     Serial #: [id number]     Status: active  Magnet Response Rate:  98.6 86.3  Indications:  A-fib   Impression & Recommendations:  Problem # 1:  CHEST PAIN, EXERTIONAL (ICD-786.50) the patient reports exertional chest pain which is consistent with angina. There are associated symptoms of diaphoresis and shortness of breath there is pain radiating to the left shoulder. His symptoms resolved with sublingual nitroglycerin. Although the patient has no prior history of coronary artery disease  his symptoms are very worrisome and we will proceed with further evaluation with diagnostic heart catheterization. I discussed the risks and benefits of the procedure with the patient and is willing to proceed. Although the patient has an unknown contrast allergy he has always been pretreated with steroids because of prior history of shellfish allergy. Although there is no true cross-reactivity between iodine and shellfish allergy because of safety reasons and because of prior heart catheterization was also done under steroid treatment we will premedicate the patient with steroids, Benadryl and H2 blockers.the patient was told that if he has recurrent chest pain at rest and resolved with nitroglycerin, that he needs to come to the emergency room. Imdur will also be increased in the meanwhile. His  updated medication list for this problem includes:    Imdur 60 Mg Xr24h-tab (Isosorbide mononitrate) .Marland Kitchen... Take 1 1/2 tabs (90mg ) daily    Nitrostat 0.4 Mg Subl (Nitroglycerin) .Marland Kitchen... Dissolve one tablet under tongue for severe chest pain as needed every 5 minutes, not to exceed 3 in 15 min time frame    Warfarin Sodium 5 Mg Tabs (Warfarin sodium) .Marland Kitchen... Take 1 tablet by mouth as directed    Ramipril 5 Mg Caps (Ramipril) .Marland Kitchen... Take 1 tab daily    Aspir-low 81 Mg Tbec (Aspirin) .Marland Kitchen... Take 1 tab daily  Problem # 2:  CARDIAC PACEMAKER IN SITU (ICD-V45.01) the patient has one little several occasions on his pacemaker, particularly the one episode in October where he fell on his concrete driveway face forward on his left chest. We will obtain a chest x-ray to evaluate the integrity of the pacemaker device is scheduled for pacemaker interrogation which has not been in quite a while. Baseline EKG demonstrates underlying atrial fibrillation with a normal paced ventricular rhythm.  Problem # 3:  COUMADIN THERAPY (ICD-V58.61) the patient remains on Coumadin therapy. He has had no cerebrovascular events. We will hold Coumadin prior to his cardiac catheterization.  Problem # 4:  ATRIAL FLUTTER (ICD-427.32) the patient is status post AV nodal ablation and is pacemaker dependent. His updated medication list for this problem includes:    Warfarin Sodium 5 Mg Tabs (Warfarin sodium) .Marland Kitchen... Take 1 tablet by mouth as directed    Aspir-low 81 Mg Tbec (Aspirin) .Marland Kitchen... Take 1 tab daily  Orders: T-Basic Metabolic Panel [id number]) T-CBC No Diff ([id number]) T-PTT ([id number]) T-Protime, Auto ([id number])  Problem # 5:  ACCIDENTAL FALLS, RECURRENT (ICD-E888.9) the patient has several accidental recurrent falls there has been no syncope or true loss of consciousness. The pacemaker will be interrogated but I do not suspect any significant underlying arrhythmias.  Other Orders: Cardiac Catheterization (Cardiac  Cath) T-Chest x-ray, 2 views ([address])  Patient Instructions: 1)  JV Cath next week 2)  Increase Imdur to 90mg  daily 3)  Nitroglycerin as needed for severe chest pain 4)  Stop Coumadin 4 days before cath.  Will need PT check day before cath 5)  PPM interrogation - day of cath 6)  Follow up in  6 months Prescriptions: NITROSTAT 0.4 MG SUBL (NITROGLYCERIN) dissolve one tablet under tongue for severe chest pain as needed every 5 minutes, not to exceed 3 in 15 min time frame  #25 x 3   Entered by:   Hoover Brunette, LPN   Authorized by:   Lewayne Bunting, MD, [facility]   Signed by:   Hoover Brunette, LPN on 24/40/1027   Method used:   Electronically to        [facility]* (retail)  [address]       [place], [place]  [address]       Ph: [phone]       Fax: [phone]   RxID:   [id number] IMDUR 60 MG XR24H-TAB (ISOSORBIDE MONONITRATE) take 1 1/2 tabs (90mg ) daily  #45 x 6   Entered by:   Hoover Brunette, LPN   Authorized by:   Lewayne Bunting, MD, [facility]   Signed by:   Hoover Brunette, LPN on 84/13/2440   Method used:   Electronically to        [facility]* (retail)       [address]       [place], [place]  [address]       Ph: [phone]       Fax: [phone]   RxID:   [id number]

## 2010-04-15 NOTE — Miscellaneous (Signed)
Summary: Device preload  Clinical Lists Changes  Observations: Added new observation of PPM INDICATN: A-fib (03/08/2010 12:09) Added new observation of MAGNET RTE: 98.6 86.3 (03/08/2010 12:09) Added new observation of PPMLEADSTAT1: active (03/08/2010 12:09) Added new observation of PPMLEADSER1: [id number] (03/08/2010 12:09) Added new observation of PPMLEADMOD1: 1346T (03/08/2010 12:09) Added new observation of PPMLEADDOI1: 04/28/2000 (03/08/2010 12:09) Added new observation of PPMLEADLOC1: RV (03/08/2010 12:09) Added new observation of PPM IMP MD: Jay Bunting, MD (03/08/2010 12:09) Added new observation of PPM DOI: 04/28/2000 (03/08/2010 12:09) Added new observation of PPM SERL#: [id number]  (03/08/2010 12:09) Added new observation of PPM MODL#: 5130  (03/08/2010 12:09) Added new observation of PACEMAKERMFG: St Jude  (03/08/2010 12:09) Added new observation of PACEMAKER MD: Hillis Range, MD  (03/08/2010 12:09)      PPM Specifications Following MD:  Hillis Range, MD     PPM Vendor:  St Jude     PPM Model Number:  [id number]     PPM Serial Number:  [id number] PPM DOI:  04/28/2000     PPM Implanting MD:  Jay Bunting, MD  Lead 1    Location: RV     DOI: 04/28/2000     Model #: 1346T     Serial #: [id number]     Status: active  Magnet Response Rate:  98.6 86.3  Indications:  A-fib

## 2010-04-21 NOTE — Medication Information (Signed)
Summary: [facility]/ [facility]  [facility]/ [facility]   Imported By: Dorise Hiss 04/16/2010 09:34:07  _____________________________________________________________________  External Attachment:    Type:   Image     Comment:   External Document

## 2010-04-26 ENCOUNTER — Encounter: Payer: Self-pay | Admitting: Cardiology

## 2010-04-26 ENCOUNTER — Encounter (INDEPENDENT_AMBULATORY_CARE_PROVIDER_SITE_OTHER): Payer: MEDICARE | Admitting: Cardiology

## 2010-04-26 DIAGNOSIS — R072 Precordial pain: Secondary | ICD-10-CM

## 2010-04-26 DIAGNOSIS — Z9889 Other specified postprocedural states: Secondary | ICD-10-CM | POA: Insufficient documentation

## 2010-04-26 DIAGNOSIS — R0602 Shortness of breath: Secondary | ICD-10-CM

## 2010-04-26 DIAGNOSIS — Z7901 Long term (current) use of anticoagulants: Secondary | ICD-10-CM | POA: Insufficient documentation

## 2010-04-26 DIAGNOSIS — I4892 Unspecified atrial flutter: Secondary | ICD-10-CM

## 2010-04-26 DIAGNOSIS — I4891 Unspecified atrial fibrillation: Secondary | ICD-10-CM

## 2010-04-26 DIAGNOSIS — I2510 Atherosclerotic heart disease of native coronary artery without angina pectoris: Secondary | ICD-10-CM

## 2010-05-03 ENCOUNTER — Encounter: Payer: Self-pay | Admitting: Internal Medicine

## 2010-05-05 NOTE — Assessment & Plan Note (Signed)
Summary: EPH-POST CATH/S[facility]   Visit Type:  Follow-up Primary Provider:  Dr. Sherryll Burger   History of Present Illness: the patient is a 65 year old male with a history of nonobstructive coronary artery disease status by recent catheterization.  The patient has history of atrial flutter/atrial fibrillation and is status post AV nodal ablation.  Status post pacemaker implantation.  He was referred for catheterization because of increased shortness of breath on exertion.  He was found to have no significant obstructive disease with possible microvessel disease.  The latter appears to be in the setting of his diabetes  mellitus.  The patient was placed on isosorbide mononitrate higher dose.  He has done well with this and reports no recurrent symptoms of dyspnea or chest pain.  He stable from a cardiovascular perspective he reports no complications after his cardiac catheterization.  Preventive Screening-Counseling & Management  Alcohol-Tobacco     Smoking Status: never  Current Medications (verified): 1)  Imdur 60 Mg Xr24h-Tab (Isosorbide Mononitrate) .... Take 1 1/2 Tabs (90mg ) Daily 2)  Nitrostat 0.4 Mg Subl (Nitroglycerin) .... Dissolve One Tablet Under Tongue For Severe Chest Pain As Needed Every 5 Minutes, Not To Exceed 3 in 15 Min Time Frame 3)  Warfarin Sodium 5 Mg Tabs (Warfarin Sodium) .... Take 1 Tablet By Mouth As Directed 4)  Metformin Hcl 1000 Mg Tabs (Metformin Hcl) .... Take 1 Tab Two Times A Day 5)  Ramipril 5 Mg Caps (Ramipril) .... Take 1 Tab Daily 6)  Glyburide 5 Mg Tabs (Glyburide) .... Take 1/2 Tablet By Mouth Two Times A Day 7)  Ra Fish Oil 1000 Mg Caps (Omega-3 Fatty Acids) .... Take 1 Cap Two Times A Day 8)  Aspir-Low 81 Mg Tbec (Aspirin) .... Take 1 Tab Daily 9)  Sertraline Hcl 50 Mg Tabs (Sertraline Hcl) .... Take 1 Tablet By Mouth Once A Day 10)  Actos 30 Mg Tabs (Pioglitazone Hcl) .... Take 1 Tablet By Mouth Once A Day 11)  Simvastatin 40 Mg Tabs (Simvastatin) .... Take 1  Tab Daily 12)  Vitamin D 1000 Unit Tabs (Cholecalciferol) .... Take 1 Tablet By Mouth Once A Day 13)  Multivitamins  Tabs (Multiple Vitamin) .... Take 1 Tablet By Mouth Once A Day 14)  Alprazolam 0.5 Mg Tabs (Alprazolam) .... Take 1 Tablet By Mouth Once A Day At Bedtime 15)  Metoprolol Tartrate 25 Mg Tabs (Metoprolol Tartrate) .... Take 1/2 Tablet By Mouth Two Times A Day  Allergies (verified): 1)  ! * Shellfish 2)  * Sulfa Drugs  Comments:  Nurse/Medical Assistant: The patient's medication list and allergies were reviewed with the patient and were updated in the Medication and Allergy Lists.  Past History:  Family History: Last updated: 03/25/2010 Negative FH of Diabetes, Hypertension, or Coronary Artery Disease  Social History: Last updated: 02/01/2008 the patient does not smoke or drink  Risk Factors: Smoking Status: never (04/26/2010)  Past Medical History: chronic atrial fibrillation/atrial flutter warfarin anticoagulation secondary to number one status post AV nodal ablation status post permanent pacemaker implantation history of chronic dyspnea but resolved.  History of chronic exertional chest pain, resolved history of lower extremity edema hiatal hernia cardiac catheterization January 2012 nonobstructive coronary artery disease, normal LV function.  Review of Systems  The patient denies fatigue, malaise, fever, weight gain/loss, vision loss, decreased hearing, hoarseness, chest pain, palpitations, shortness of breath, prolonged cough, wheezing, sleep apnea, coughing up blood, abdominal pain, blood in stool, nausea, vomiting, diarrhea, heartburn, incontinence, blood in urine, muscle weakness, joint pain,  leg swelling, rash, skin lesions, headache, fainting, dizziness, depression, anxiety, enlarged lymph nodes, easy bruising or bleeding, and environmental allergies.    Vital Signs:  Patient profile:   65 year old male Height:      72 inches Weight:      222  pounds Pulse rate:   82 / minute BP sitting:   133 / 74  (left arm) Cuff size:   large  Vitals Entered By: Carlye Grippe (April 26, 2010 9:47 AM)  Physical Exam  Additional Exam:  General: Well-developed, well-nourished in no distress head: Normocephalic and atraumatic eyes PERRLA/EOMI intact, conjunctiva and lids normal nose: No deformity or lesions mouth normal dentition, normal posterior pharynx neck: Supple, no JVD.  No masses, thyromegaly or abnormal cervical nodes lungs: Normal breath sounds bilaterally without wheezing.  Normal percussion heart: regular rate and rhythm with normal S1 and S2, no S3 or S4.  PMI is normal.  No pathological murmurs abdomen: Normal bowel sounds, abdomen is soft and nontender without masses, organomegaly or hernias noted.  No hepatosplenomegaly musculoskeletal: Back normal, normal gait muscle strength and tone normal pulsus: Pulse is normal in all 4 extremities Extremities: No peripheral pitting edema neurologic: Alert and oriented x 3 skin: Intact without lesions or rashes, several chigger bites cervical nodes: No significant adenopathy psychologic: Normal affect    PPM Specifications Following MD:  Hillis Range, MD     PPM Vendor:  St Jude     PPM Model Number:  [id number]     PPM Serial Number:  [id number] PPM DOI:  04/28/2000     PPM Implanting MD:  Lewayne Bunting, MD  Lead 1    Location: RV     DOI: 04/28/2000     Model #: 1346T     Serial #: [id number]     Status: active  Magnet Response Rate:  98.6 86.3  Indications:  A-fib   Impression & Recommendations:  Problem # 1:  SHORTNESS OF BREATH (ICD-786.05) the patient's shortness of breath has significantly improved after increasing isosorbide mononitrate.  He does note for coronary artery disease by catheterization. His updated medication list for this problem includes:    Ramipril 5 Mg Caps (Ramipril) .Marland Kitchen... Take 1 tab daily    Aspir-low 81 Mg Tbec (Aspirin) .Marland Kitchen... Take 1 tab daily     Metoprolol Tartrate 25 Mg Tabs (Metoprolol tartrate) .Marland Kitchen... Take 1/2 tablet by mouth two times a day  Problem # 2:  ATRIAL FLUTTER (ICD-427.32) status post AV nodal ablation. His updated medication list for this problem includes:    Warfarin Sodium 5 Mg Tabs (Warfarin sodium) .Marland Kitchen... Take 1 tablet by mouth as directed    Aspir-low 81 Mg Tbec (Aspirin) .Marland Kitchen... Take 1 tab daily    Metoprolol Tartrate 25 Mg Tabs (Metoprolol tartrate) .Marland Kitchen... Take 1/2 tablet by mouth two times a day  Problem # 3:  COUMADIN THERAPY (ICD-V58.61) tolerating Coumadin without any complications.  Problem # 4:  CARDIAC PACEMAKER IN SITU (ICD-V45.01) Assessment: Comment Only  Patient Instructions: 1)  Your physician recommends that you continue on your current medications as directed. Please refer to the Current Medication list given to you today. 2)  Follow up in  6 months Prescriptions: METOPROLOL TARTRATE 25 MG TABS (METOPROLOL TARTRATE) Take 1/2 tablet by mouth two times a day  #90 x 3   Entered by:   Hoover Brunette, LPN   Authorized by:   Lewayne Bunting, MD, [facility]   Signed by:   Hoover Brunette, LPN on  04/26/2010   Method used:   Electronically to        [facility]* (retail)       [address]       [place], [place]  [address]       Ph: [phone]       Fax: [phone]   RxID:   [id number]

## 2010-07-21 ENCOUNTER — Encounter: Payer: Self-pay | Admitting: Internal Medicine

## 2010-07-21 DIAGNOSIS — I498 Other specified cardiac arrhythmias: Secondary | ICD-10-CM

## 2010-07-27 NOTE — Assessment & Plan Note (Signed)
[facility]                          [facility] NOTE   Jay Campbell, Jay Campbell                     MRN:          [id number]  DATE:10/06/2006                            DOB:          1945/09/08    HISTORY OF PRESENT ILLNESS:  The patient is a 65 year old male with a  history of nonobstructive coronary artery disease.  The patient does  have small vessel disease, he has chronic atrial fibrillation status  post AV nodal ablation, pacemaker implantation.  He is also on chronic  Coumadin therapy.   The patient has been doing well.  He reports no shortness of breath,  orthopnea, PND.  He does still complain of mild substernal chest pain on  heavy exertion which has been a chronic problem which we have  interpreted as small vessel ischemia.   MEDICATIONS:  Please see list in the chart.   Of note is that the patient is taking 60 of Imdur in the morning and 30  in the evening.  We have changed this today to 120 mg in the morning.   PHYSICAL EXAMINATION:  VITAL SIGNS:  Blood pressure 151/85, heart rate  72, weight is 229 pounds.  GENERAL:  Well-nourished white male in no apparent distress.  HEENT:  Pupils anisocoric, conjunctivae clear.  NECK:  Supple, normal carotid upstroke, no carotid bruits.  LUNGS:  Clear.  HEART:  Regular rate and rhythm.  ABDOMEN:  Soft.  EXTREMITIES:  No cyanosis, clubbing, or edema.   PROBLEM LIST:  1. Chronic atrial fibrillation.  2. Warfarin anticoagulation secondary to #1.  3. Status post AV nodal ablation.  4. Status post permanent pacemaker implantation.  5. Chronic dyspnea.  6. Chronic exertional chest pain.  7. Lower extremity edema   PLAN:  1. The patient is doing quite well.  He does have chronic stable      angina secondary to small vessel disease.  2. I have adjusted the patient's Imdur to 120 mg in the morning and      discontinued his evening      dose.  3. The patient can follow up with [place] in  one year.     Jay Codding, MD,FACC  Electronically Signed    GED/MedQ  DD: 10/06/2006  DT: 10/07/2006  Job #: [id number]

## 2010-07-30 NOTE — Cardiovascular Report (Signed)
[place]. [facility]  Patient:    Jay Campbell, Jay Campbell                     MRN: [id number] Proc. Date: 06/29/00 Adm. Date:  [id number] Disc. Date: [id number] Attending:  Learta Codding CC:         [facility], [facility]   Cardiac Catheterization  DATE OF BIRTH:  [birth year]  CARDIOLOGIST:  Lewayne Bunting, M.D.  PROCEDURES PERFORMED: 1. Left heart catheterization with selective coronary angiography. 2. Left ventriculography.  DIAGNOSIS:  Nonobstructive coronary artery disease.  HISTORY:  The patient is a 65 year old male, with a history of diabetes mellitus and hypertension.  The patient also has a history of atrial fibrillation with several unsuccessful at cardioversion and direct therapy trial with Sotalol.  Subsequently, the decision was made to proceed with an ablate and pace procedure.  Post pacemaker placement, however, the patient reported shortness of breath and a decision was made to proceed with diagnostic catheterization to rule out underlying coronary artery disease.  DESCRIPTION OF PROCEDURE:  After informed consent was obtained, the patient was brought to the catheterization laboratory.  The right groin was sterilely prepped and draped.  A 6 French arterial sheath was placed using the modified Seldinger technique.  A 6 [place] and JR4 catheter was used to engage the left and right coronary system, respectively.  Selective angiography was performed in various projections using manual injections of contrast. Ventriculography was performed with a 6 French pigtail catheter and appropriate left-sided hemodynamics were obtained.  At the termination of the case, all catheters and sheath were removed and no complications were noted.  FINDINGS:  Left ventricular pressure 119/70 mmHg, aortic pressure 119/69 mmHg.  VENTRICULOGRAPHY:  Ejection fraction 60%.  No mitral regurgitation.  No segmental wall motion abnormalities.  SELECTIVE CORONARY  ANGIOGRAPHY: 1. Left main coronary is a large caliber vessel with no evidence of    flow-limiting coronary artery disease. 2. Left anterior descending artery had a 40% diffuse stenosis in the    proximal segment as well as a mid 30% diffuse stenosis.  The distal    vessel was rather small with rapid tapering. 3. Left circumflex coronary artery was within normal limits with no evidence    of flow-limiting coronary artery disease. 4. Right coronary artery:  There was again no evidence of flow-limiting    coronary artery disease.  RECOMMENDATIONS:  The patient will be discharged later today.  Followup will be provided in the office of [facility]. DD:  10/23/00 TD:  10/23/00 Job: [id number]

## 2010-07-30 NOTE — Cardiovascular Report (Signed)
NAMEDANON, LOGRASSO              ACCOUNT NO.:  [id number]   MEDICAL RECORD NO.:  [id number]          PATIENT TYPE:  OIB   LOCATION:  6501                         FACILITY:  [facility]   PHYSICIAN:  Rollene Rotunda, M.D.   DATE OF BIRTH:  1945-11-05   DATE OF PROCEDURE:  DATE OF DISCHARGE:                              CARDIAC CATHETERIZATION   PRIMARY CARE PHYSICIAN:  Dr. Weyman Pedro.   PROCEDURE:  Left heart catheterization/coronary angiography.   INDICATION:  Evaluate patient with increasing exertional angina (411.1).   PROCEDURE NOTE:  Left heart catheterization is performed via the right  femoral artery.  The artery was cannulated using anterior wall puncture.  A  #4 French arterial sheath was inserted via the modified Seldinger technique.  Preformed Judkins and a pigtail catheter were utilized.  The patient  tolerated the procedure well and left the lab in stable condition.   RESULTS:  Hemodynamics:  LV 143/14, AO 150/75.  Coronaries and left main was  normal.  The LAD had 38 to 40% proximal stenosis.  There was mid and distal  diffuse disease as the vessel wrapped the apex.  These were luminal  irregularities with no high grade lesions.  Her first diagonal was small  with ostial 40% stenosis.  The second diagonal was large and normal.  The  circumflex had a large ramus intermedius which was moderate sized with  luminal irregularities.  The AV groove had diffuse luminal irregularities.  Her first obtuse marginal was large and normal.  A second obtuse marginal  was moderate size and normal.  The right coronary artery was the dominant  vessel.  There was 30% stenosis after the PDA before a small posterior  lateral.  PDA itself was large and normal.   Left ventriculogram:  The left ventriculogram was obtained in the RAL  projection.  The EF was 65% with normal wall motion.  Conclusion:  Normal  left ventricular function.  No high grade large vessel disease.  I do  suspect small  vessel disease based on the description of his pain.   PLAN:  I have discussed this with Dr. Andee Lineman and with Dr. Eliberto Ivory.  He will  follow with Dr. Eliberto Ivory who will work up nonanginal etiologies of chest pain.  In the meantime, we will continue to manage him for probable small vessel  disease.  We will increase his Imdur from 30 to 60 mg daily.  He can have  his Norvasc titrated upward to 10 mg daily.  We will have him go back and  see Dr. Andee Lineman in about two weeks.  He is going to resume his Coumadin at  his previous dose.  I have asked him to go get his Coumadin level checked  Thursday morning at Dr. Magnus Ivan office.     JH/MEDQ  D:  06/28/2004  T:  06/28/2004  Job:  [id number]   cc:   Weyman Pedro, MD   [facility] in [place]

## 2010-07-30 NOTE — Assessment & Plan Note (Signed)
[facility]                            [facility] NOTE   BALDWIN, RACICOT                     MRN:          [id number]  DATE:12/19/2005                            DOB:          12/14/1945    REFERRING PHYSICIAN:  Weyman Pedro   HISTORY OF PRESENT ILLNESS:  The patient is a 65 year old male with a  history of nonobstructive coronary artery disease.  The patient has chronic  atrial fibrillation, and he is status post AV nodal ablation as well as  status post pacemaker implantation.  The patient's last catheterization was  in 2006, which demonstrated no obstructive coronary artery disease.  The  patient states today that he has occasional chest pains which are atypical.  They are nonexertional.  He has dyspnea only on significant exertion.  He  denies any palpitations or syncope.  He has rescheduled his appointment on  several occasions and did not have a recent pacemaker followup.  His wife is  concerned about his lower extremity edema.   A.M. MEDICATIONS:  1. Metformin 1000 mg.  2. Altace 5 mg.  3. Norvasc 5 mg.  4. Glyburide 5 mg 1/2 tablet.  5. 30 mg of isosorbide.  6. 400 mg of ibuprofen.  7. 1000 mg of fish oil.  8. 81 mg of aspirin.  9. 25 mg of hydrochlorothiazide.   P.M. MEDICATIONS:  1. 500 mg of metformin.  2. 45 mg of Actos, changed from Avandia.  3. 5 mg of glyburide.  4. 400 mg of ibuprofen.  5. 30 mg of isosorbide mononitrate.  6. 5 mg of Norvasc.  7. 1000 mg of fish oil.  8. 10 mg of Lipitor.  9. 5 mg of warfarin Sunday, Wednesday, and Friday, and 7.5 mg Monday,      Tuesday, Thursday, and Saturday.   PHYSICAL EXAMINATION:  VITAL SIGNS:  Blood pressure is 142/82, heart rate is  60 beats per minute.  NECK:  Reveals normal carotid upstroke, and no carotid bruits.  LUNGS:  Clear bilaterally.  HEART:  Regular rate and rhythm, with normal S1, S2, with no murmurs, rubs,  or gallops.  ABDOMEN:  Soft,  nontender.  No rebound or guarding.  Good bowel sounds.  EXTREMITIES:  No cyanosis, clubbing, or edema.  NEUROLOGIC:  The patient is alert, oriented, and grossly nonfocal.   PROBLEM LIST:  1. Chronic atrial fibrillation.  2. Warfarin anticoagulation secondary to #1.  3. Status post atrioventricular nodal ablation.  4. Status post permanent pacemaker implantation.  5. Dyspnea, chronic.  6. Atypical chest pain.  7. Lower extremity edema.   PLAN:  1. The patient's lower extremity edema could be multifactorial.  The      patient is on high-dose Norvasc which could be contributing.  He also      could have salt and water retention secondary to nonsteroidals.  2. I have changed the patient's hydrochlorothiazide to Lasix 20 mg p.o.      daily.  He can have a BMET done in one week.  3. I have scheduled the patient for a pacemaker clinica followup, as  this      has been rescheduled on several occasions.  His EKG with and without      magnet today demonstrates normal pacemaker function.  4. Anticoagulation.  He is followed by Dr. Eliberto Ivory, and this will be      deferred to his office.  5. I have also increased the patient's Imdur in the morning, given that      the patient has some increase in his blood pressure with      discontinuation of Norvasc.  6. The patient can follow up otherwise with me in six months.       Learta Codding, MD,FACC     GED/MedQ  DD:  12/19/2005  DT:  12/20/2005  Job #:  [id number]   cc:   Weyman Pedro

## 2010-07-30 NOTE — Cardiovascular Report (Signed)
NAME:  Jay Campbell, Jay Campbell NO.:  [id number]   MEDICAL RECORD NO.:  [id number]                   PATIENT TYPE:  OIB   LOCATION:                                       FACILITY:  [facility]   PHYSICIAN:  Learta Codding, M.D.                 DATE OF BIRTH:  [birth year]   DATE OF PROCEDURE:  09/10/2002  DATE OF DISCHARGE:                              CARDIAC CATHETERIZATION   CARDIOLOGIST:  Learta Codding, M.D.   PROCEDURE PERFORMED:  1. Left heart catheterization with selective coronary angiography.  2. Ventriculography.   INDICATION:  The patient is a 66 year old male with history of  nonobstructive coronary artery disease.  The patient reports increased  substernal chest pain and has been referred for cardiac catheterization to  reassess his coronary anatomy.   DESCRIPTION OF PROCEDURE:  After informed consent was obtained, the patient  was brought to the catheterization laboratory.  The patient's right groin  was sterilely prepped and draped.  A 6-French arterial sheath was used to  access the right femoral artery.  Modified Seldinger technique was used.  Selective coronary angiography was performed in various projections using  manual injection contrast.  Ventriculography was performed using power  injection.  No complications were encountered during procedure.   FINDINGS:   HEMODYNAMICS:  Left ventricular pressure 140/80 mmHg, aortic pressure 143/77  mmHg. There was no gradient across the aortic valve.   VENTRICULOGRAPHY:  Ejection fraction 60% with segmental wall motion  abnormalities.   SELECTIVE CORONARY ANGIOGRAPHY:  1. Left main coronary artery was large caliber vessel with no evidence of     flow-limiting disease.  2. Left anterior descending artery demonstrated in its proximal segment     proximally diffuse 30% stenosis and the distal vessel was diffusely     diseased and quite small. However, there was no significant change     compared to prior  angiographic studies.  3. Circumflex coronary artery was free of flow-limiting disease.  4. The right coronary artery was dominant and there was no evidence of flow-     limiting disease.   RECOMMENDATIONS:  Coumadin can be resumed after patient's cardiac  catheterization.  Norvasc will be added to his medical regimen.  Glucophage  will be held for 48 hours.  Otherwise, continued medical therapy.                                               Learta Codding, M.D.    GED/MEDQ  D:  03/30/2003  T:  03/30/2003  Job:  [id number]

## 2010-07-30 NOTE — Discharge Summary (Signed)
[place]. [facility]  Patient:    Jay Campbell, Jay Campbell                     MRN: [id number] Adm. Date:  [id number] Disc. Date: 04/29/00 Attending:  Lewayne Bunting Dictator:   Dian Queen, P.A.-C. [facility] CC:         Weyman Pedro, M.D. - [address], [place], [place] [address]  [facility] - [place], [place]  [facility] - [place], [place]   Discharge Summary  HISTORY OF PRESENT ILLNESS:  Mr. Klinke is a very pleasant gentleman with atrial fibrillation, who is followed by Dr. Lewayne Bunting and Dr. Winona Legato in [place].  He has diabetes mellitus and hypertension.  He has had atrial fibrillation for 17 years.  Maintaining sinus rhythm has been difficult.  He has been shocked several times.  It was felt that AV node ablation and a permanent pacemaker implantation would be beneficial.  He is admitted now for that purpose.  HOSPITAL COURSE:  On the day of admission he underwent a permanent pacemaker implantation, followed by AV node ablation by Dr. Doylene Canning. Ladona Ridgel, without complications.  His rate-controlling drugs were discontinued.  DISPOSITION:  He was kept overnight and discharged home on the following morning.  His wound looked fine.  LABORATORY DATA:  His INR was 1.8 on April 24, 2000, and 1.3 on the day of surgery.  Hemoglobin 15.3 with hematocrit of 45, and a white count of 4300. Electrolytes and renal function were normal.  His sugar several days prior to admission was 286.  DISCHARGE DIAGNOSES: 1. Chronic atrial fibrillation with bradycardia/tachycardia, treated    with atrioventricular node ablation and permanent pacemaker implantation    by Dr. Ladona Ridgel, without complications. 2. Diabetes mellitus. 3. Controlled hypertension. 4. Hyperlipoproteinemia.  DISCHARGE MEDICATIONS: 1. Glucophage. 2. Glyburide. 3. Avandia. 4. Lipitor. 5. Altace. 6. Coumadin. 7. Ambien as prior to admission.  FOLLOWUP:  He will have a pro time done on Thursday.  He will see Dr.  Ladona Ridgel in [place] in a couple of weeks, at which time we will plan to reduce his heart rate from 90 to 80.  Long-term followup with Dr. Weyman Pedro and with Dr. Lewayne Bunting. DD:  04/29/00 TD:  04/29/00 Job: [id number]

## 2010-07-30 NOTE — Procedures (Signed)
[place]. [facility]  Patient:    Jay Campbell, Jay Campbell                     MRN: [id number] Proc. Date: 04/28/00 Adm. Date:  [id number] Attending:  Lewayne Bunting CC:         Weyman Pedro, M.D.             [facility], [place]             [facility] Cords, [facility]                           Procedure Report  PROCEDURE:  Permanent pacemaker implantation, followed by AV node ablation.  INDICATION:  Chronic (17 years) atrial fibrillation with rapid ventricular rate despite medical therapy.  INTRODUCTION:  The patient is a very pleasant 65 year old man with a history of diabetes and hypertension, who has had atrial fibrillation for some 17 years.  He has been hospitalized several times with tachypalpitations and documented heart rates in the 150-160 range despite treatment with beta blockers and calcium channel blockers.  He is subsequently referred for AV node ablation and permanent pacemaker implantation.  DESCRIPTION OF PROCEDURE:  After informed consent was obtained, the patient was taken to the diagnostic EP lab in a fasting state.  After the usual preparation and draping, intravenous fentanyl and midazolam were given for sedation.  Lidocaine 30 cc was infiltrated into the left infraclavicular region.  A 6 cm incision was carried out over this region and electrocautery utilized to dissect down to the subpectoralis fascia.  Contrast 20 cc was injected into the left upper extremity venous system, and the left subclavian vein was subsequently punctured and a J-wire advanced into the right atrium. An 8 French peel-away sheath was placed into the left subclavian vein, and the Pacesetter model 1346 passive-fixation 58 cm pacing lead was placed into the right ventricle.  Mapping was carried out in the right ventricle, and R-waves of 14 millivolts were found at the region of the RV apex.  At this location, the pacing impedance was 650 Ohms.  The pacing threshold was  0.4 volts at 0.5 milliseconds.  Please note that the serial number on the passive-fixation ventricular pacing lead was [id number].  With the lead in satisfactory position, it was secured to the subpectoralis fascia with a figure-of-eight silk suture. It was then secured with a sewing sleeve to the subpectoralis fascia with a silk suture.  At this point, the lead was checked several times and maintained satisfactory pacing and sensing numbers.  A subcutaneous pocket was then fashioned with electrocautery.  At this point, attention was turned to hemostasis, and this was obtained with the use of electrocautery.  The Pacesetter Affinity SR model [id number], serial number [id number], was inserted and connected to the pacing lead and placed in the subcutaneous pocket.  Both before and after pacemaker generator insertion, the pocket was irrigated with kanamycin solution.  The generator was secured with a silk suture and after additional irrigation, the wound was closed with a layer of 2-0 Vicryl, followed by a layer of 3-0 Vicryl, followed by a layer of 4-0 Vicryl.  Benzoin was painted on the skin and Steri-Strips were applied and a pressure dressing placed.  The patient was then prepped for AV node ablation.  After the usual preparation and draping, the right femoral vein was punctured, and a 7 French quadripolar ablation catheter was advanced into  the femoral vein and into the right atrium.  Mapping of the patients His bundle was carried out.  The HV interval was 58 msec.  A total of three RF energy applications were subsequently delivered to the His bundle region.  This resulted in the creation of complete heart block.  At this point, the patient was observed for approximately 25 minutes and had no evidence of recurrent AV node conduction.  The catheter was removed, hemostasis was assured, and the patient was returned to his room in satisfactory condition.  COMPLICATIONS : None.  RESULTS:  This  demonstrates successful implantation of a Pacesetter single-chamber pacemaker, followed by the creation of complete heart block with a total of three RF energy applications delivered to the patients AV node.  There were no immediate procedure complications. DD:  04/28/00 TD:  04/28/00 Job: [id number]

## 2010-10-21 ENCOUNTER — Encounter: Payer: Self-pay | Admitting: Internal Medicine

## 2010-10-21 DIAGNOSIS — I4891 Unspecified atrial fibrillation: Secondary | ICD-10-CM

## 2010-12-06 ENCOUNTER — Other Ambulatory Visit: Payer: Self-pay | Admitting: Cardiology

## 2011-01-10 ENCOUNTER — Other Ambulatory Visit: Payer: Self-pay | Admitting: Cardiology

## 2011-01-20 ENCOUNTER — Encounter: Payer: Self-pay | Admitting: Internal Medicine

## 2011-01-20 DIAGNOSIS — I495 Sick sinus syndrome: Secondary | ICD-10-CM

## 2011-03-11 ENCOUNTER — Encounter: Payer: Self-pay | Admitting: *Deleted

## 2011-03-21 ENCOUNTER — Ambulatory Visit (INDEPENDENT_AMBULATORY_CARE_PROVIDER_SITE_OTHER): Payer: Medicare Other | Admitting: Cardiology

## 2011-03-21 DIAGNOSIS — I4891 Unspecified atrial fibrillation: Secondary | ICD-10-CM

## 2011-03-21 MED ORDER — TRAZODONE HCL 50 MG PO TABS: 50.0000 mg | ORAL_TABLET | Freq: Every day | ORAL | Status: DC

## 2011-03-21 NOTE — Patient Instructions (Signed)
Your physician wants you to follow-up in: 6 months. You will receive a reminder letter in the mail one-two months in advance. If you don't receive a letter, please call our office to schedule the follow-up appointment. Your physician recommends that you continue on your current medications as directed. Please refer to the Current Medication list given to you today. Start Trazodone 50 mg Take 1 tablet by mouth every night for sleep.  Follow up with Dr. Johney Frame as scheduled.

## 2011-03-21 NOTE — Progress Notes (Signed)
Peyton Bottoms, MD, [facility] ABIM Board Certified in Adult Cardiovascular Medicine,Internal Medicine and Critical Care Medicine    CC: Followup patient with atrial fibrillation status post pacemaker  HPI:  The patient is a 66 year old male with history of chronic atrial fibrillation, status post AV nodal ablation and permanent pacemaker implantation. He has diabetes mellitus and in the past has had chest pain. Had a catheterization in 2002 but recently in January of 2012 which showed no significant epicardial coronary artery disease although small vessel disease could not be excluded secondary to his diabetes mellitus. Fortunately, he reports however no recurrent chest pain. He has no shortness of breath orthopnea PND. He remains still very active and continues to do quite a bit of hunting. His wife is concerned about the fact that his lost quite a bit of weight involuntary and more importantly has lost his appetite. She also noted that he is not sleeping very well and only gets 4 hours of sleep a night. He has a lot of ruminating thoughts. He feels he has no appetite. He reports postprandial fullness.      PMH: reviewed and listed in Problem List in Electronic Records (and see below) Past Medical History  Diagnosis Date  . Coronary artery disease   . Atrial fibrillation or flutter     chronic AF  . S/P AV nodal ablation   . Warfarin anticoagulation   . S/P cardiac catheterization 03/30/2010    mild nonobstructive coronary artery disease, normal left ventricular systolic function, possible microvessel disease causing the patient's complaint of exertional chest pain  . Lower extremity edema     history  . Hiatal hernia   . History of chest pain     resolved  . Dyspnea     h/o  resolved   Past Surgical History  Procedure Date  . Pacemaker placement   . Cardiac catheterization 03/2010    nonobstructive coronary artery disease, normal LV function.    Allergies/SH/FHX : available in  Electronic Records for review  Allergies  Allergen Reactions  . Shellfish Allergy   . Sulfonamide Derivatives    History   Social History  . Marital Status: Married    Spouse Name: N/A    Number of Children: N/A  . Years of Education: N/A   Occupational History  . Not on file.   Social History Main Topics  . Smoking status: Never Smoker   . Smokeless tobacco: Never Used  . Alcohol Use: Not on file  . Drug Use: Not on file  . Sexually Active: Not on file   Other Topics Concern  . Not on file   Social History Narrative  . No narrative on file   No family history on file.  Medications: Current Outpatient Prescriptions  Medication Sig Dispense Refill  . ALPRAZolam (XANAX) 0.5 MG tablet Take 0.5 mg by mouth at bedtime.        Marland Kitchen aspirin EC 81 MG tablet Take 81 mg by mouth daily.        . cholecalciferol (VITAMIN D) 1000 UNITS tablet Take 1,000 Units by mouth daily.        . fish oil-omega-3 fatty acids 1000 MG capsule Take 1 g by mouth 2 (two) times daily.        Marland Kitchen glyBURIDE (DIABETA) 5 MG tablet Take 2.5 mg by mouth 2 (two) times daily with a meal.        . IMDUR 60 MG 24 hr tablet TAKE 1 & 1/2  TABLET BY MOUTH ONCE DAILY.  45 each  6  . metFORMIN (GLUCOPHAGE) 1000 MG tablet Take 1,000 mg by mouth 2 (two) times daily with a meal.        . metoprolol tartrate (LOPRESSOR) 25 MG tablet Take 12.5 mg by mouth 2 (two) times daily.        . Multiple Vitamin (MULTIVITAMIN) tablet Take 1 tablet by mouth daily.        . nitroGLYCERIN (NITROSTAT) 0.4 MG SL tablet Place 0.4 mg under the tongue every 5 (five) minutes as needed.        . pioglitazone (ACTOS) 15 MG tablet Take 15 mg by mouth daily.        . ramipril (ALTACE) 5 MG capsule Take 5 mg by mouth daily.        . sertraline (ZOLOFT) 50 MG tablet Take 50 mg by mouth daily.        . simvastatin (ZOCOR) 40 MG tablet Take 40 mg by mouth at bedtime.        Marland Kitchen warfarin (COUMADIN) 5 MG tablet Take 5 mg by mouth as directed.        .  traZODone (DESYREL) 50 MG tablet Take 1 tablet (50 mg total) by mouth at bedtime.  30 tablet  1    ROS: No nausea or vomiting. No fever or chills.No melena or hematochezia.No bleeding.No claudication  Physical Exam: There were no vitals taken for this visit. General: Well-nourished white male in no distress Neck: Normal carotid upstroke no carotid bruit. No thyromegaly Lungs: Clear breath sounds bilaterally no wheezing Cardiac: Regular rate and rhythm with normal S1 and paradoxically split second heart sound. No pathological murmurs Vascular: No edema. Skin: Multiple chigger bites. Warm and dry Physcologic: Normal affect  12lead ECG: Not performed Limited bedside ECHO:N/A   Patient Active Problem List  Diagnoses   AODM   CORONARY ATHEROSCLEROSIS NATIVE CORONARY ARTERY nonobstructive coronary disease by catheterization in 2012    Atrial flutter   Precordial pain   CARDIAC PACEMAKER IN SITU   S/P AV nodal ablation   Warfarin anticoagulation  Rule out gastroparesis   Insomnia     PLAN   Patient experienced postprandial fullness and loss of appetite. He made diabetic gastroparesis and have asked him to discuss this with his primary care physician and possibly see a gastroenterologist. He may benefit from domperidone.  Patient has significant insomnia and have given him a prescription for trazodone 50 mg by mouth each bedtime.  From a cardiac standpoint is doing well. He can continue his current medical regimen  Diabetes is monitored by his primary care physician  We also scheduled a followup appointment Dr. Johney Frame for yearly pacemaker interrogation.

## 2011-04-13 ENCOUNTER — Encounter: Payer: Self-pay | Admitting: Internal Medicine

## 2011-04-13 ENCOUNTER — Ambulatory Visit (INDEPENDENT_AMBULATORY_CARE_PROVIDER_SITE_OTHER): Payer: Medicare Other | Admitting: Internal Medicine

## 2011-04-13 ENCOUNTER — Telehealth: Payer: Self-pay | Admitting: *Deleted

## 2011-04-13 DIAGNOSIS — I4892 Unspecified atrial flutter: Secondary | ICD-10-CM

## 2011-04-13 DIAGNOSIS — I4821 Permanent atrial fibrillation: Secondary | ICD-10-CM

## 2011-04-13 DIAGNOSIS — Z9889 Other specified postprocedural states: Secondary | ICD-10-CM

## 2011-04-13 DIAGNOSIS — I4891 Unspecified atrial fibrillation: Secondary | ICD-10-CM

## 2011-04-13 DIAGNOSIS — Z950 Presence of cardiac pacemaker: Secondary | ICD-10-CM

## 2011-04-13 LAB — PACEMAKER DEVICE OBSERVATION
BATTERY VOLTAGE: 2.74 V
BRDY-0004RV: 120 {beats}/min
DEVICE MODEL PM: 510526

## 2011-04-13 NOTE — Progress Notes (Signed)
PCP:  Kirstie Peri, MD, MD Primary Cardiologist:  Dr Andee Lineman  The patient presents today for routine electrophysiology followup.  Since last being seen in our clinic, the patient reports doing very well.  Today, he denies symptoms of palpitations, exertional chest pain, shortness of breath, orthopnea, PND, lower extremity edema, dizziness, presyncope, syncope, or neurologic sequela.  The patient feels that he is tolerating medications without difficulties and is otherwise without complaint today.   Past Medical History  Diagnosis Date  . Coronary artery disease     mild nonobstructive  . Atrial fibrillation or flutter     permanent AF  . S/P AV nodal ablation     s/p SJM PPM  . Warfarin anticoagulation   . S/P cardiac catheterization 03/30/2010    mild nonobstructive coronary artery disease, normal left ventricular systolic function, possible microvessel disease causing the patient's complaint of exertional chest pain  . Lower extremity edema     history  . Hiatal hernia   . History of chest pain     resolved  . Dyspnea     h/o  resolved   Past Surgical History  Procedure Date  . Pacemaker placement 2002    SJM implanted by Dr Ladona Ridgel with AV nodal ablation performed  . Cardiac catheterization 03/2010    nonobstructive coronary artery disease, normal LV function.    Current Outpatient Prescriptions  Medication Sig Dispense Refill  . ALPRAZolam (XANAX) 0.5 MG tablet Take 0.5 mg by mouth at bedtime.        Marland Kitchen aspirin EC 81 MG tablet Take 81 mg by mouth daily.        . cholecalciferol (VITAMIN D) 1000 UNITS tablet Take 1,000 Units by mouth daily.        . fish oil-omega-3 fatty acids 1000 MG capsule Take 1 g by mouth 2 (two) times daily.        Marland Kitchen glyBURIDE (DIABETA) 5 MG tablet Take 2.5 mg by mouth 2 (two) times daily with a meal.        . IMDUR 60 MG 24 hr tablet TAKE 1 & 1/2 TABLET BY MOUTH ONCE DAILY.  45 each  6  . metFORMIN (GLUCOPHAGE) 1000 MG tablet Take 1,000 mg by mouth 2  (two) times daily with a meal.        . metoprolol tartrate (LOPRESSOR) 25 MG tablet Take 12.5 mg by mouth 2 (two) times daily.        . Multiple Vitamin (MULTIVITAMIN) tablet Take 1 tablet by mouth daily.        . nitroGLYCERIN (NITROSTAT) 0.4 MG SL tablet Place 0.4 mg under the tongue every 5 (five) minutes as needed.        . pioglitazone (ACTOS) 15 MG tablet Take 15 mg by mouth daily.        . ramipril (ALTACE) 5 MG capsule Take 5 mg by mouth daily.        . sertraline (ZOLOFT) 50 MG tablet Take 1/2 tab daily      . simvastatin (ZOCOR) 40 MG tablet Take 40 mg by mouth at bedtime.        . traZODone (DESYREL) 50 MG tablet Take 1 and1/2 tab daily      . warfarin (COUMADIN) 5 MG tablet Take 5 mg by mouth as directed.          Allergies  Allergen Reactions  . Shellfish Allergy   . Sulfonamide Derivatives     History   Social History  .  Marital Status: Married    Spouse Name: N/A    Number of Children: N/A  . Years of Education: N/A   Occupational History  . Not on file.   Social History Main Topics  . Smoking status: Never Smoker   . Smokeless tobacco: Never Used  . Alcohol Use: No  . Drug Use: No  . Sexually Active: Not on file   Other Topics Concern  . Not on file   Social History Narrative  . No narrative on file     Physical Exam: Filed Vitals:   04/13/11 1002  BP: 131/71  Pulse: 69  Height: 6' (1.829 m)  Weight: 214 lb (97.07 kg)    GEN- The patient is well appearing, alert and oriented x 3 today.   Head- normocephalic, atraumatic Eyes-  Sclera clear, conjunctiva pink Ears- hearing intact Oropharynx- clear Neck- supple, no JVP Lymph- no cervical lymphadenopathy Lungs- Clear to ausculation bilaterally, normal work of breathing Chest- pacemaker pocket is well healed Heart- Regular rate and rhythm (paced) GI- soft, NT, ND, + BS Extremities- no clubbing, cyanosis, or edema   Pacemaker interrogation- reviewed in detail today,  See PACEART  report  Assessment and Plan:

## 2011-04-13 NOTE — Telephone Encounter (Signed)
Patient in office to see Dr. Johney Frame.  Stopped by my desk to ask question about Trazodone.  States you put him on 50mg  every evening & told him he could increase if necessary, but did not see this documented.  He is requesting refill so he can take 1 1/2 every evening.  Advised him that he may need to address this with his PMD since Cardiology does not need to be managing sleep medication long term.  Does see PMD on 2/5, but will run out before then.  [facility].

## 2011-04-13 NOTE — Assessment & Plan Note (Signed)
Goal INR 2-3 He reports labile INRs recently.  Because of this, I have strongly recommended that he switch to pradaxa or xarelto.  At this time, he would like to continue coumadin.  He will contact my office (or Dr Sherryll Burger) if he decides to switch to a novel anticoagulant drug.

## 2011-04-13 NOTE — Assessment & Plan Note (Signed)
Complete heart block  Normal pacemaker function See Pace Art report No changes today

## 2011-04-17 NOTE — Telephone Encounter (Signed)
Can refill 75 mg PO qhs - until he sees his LMD.

## 2011-04-18 NOTE — Telephone Encounter (Signed)
Wife notified of below.  Will not send rx since patient seeing PMD tomorrow.  She verbalized understanding.  Will forward note to PMD at her request.

## 2011-07-13 ENCOUNTER — Encounter: Payer: Self-pay | Admitting: Internal Medicine

## 2011-07-13 DIAGNOSIS — I495 Sick sinus syndrome: Secondary | ICD-10-CM

## 2011-09-22 ENCOUNTER — Ambulatory Visit (INDEPENDENT_AMBULATORY_CARE_PROVIDER_SITE_OTHER): Payer: Medicare Other | Admitting: Cardiovascular Disease

## 2011-09-22 ENCOUNTER — Encounter: Payer: Self-pay | Admitting: Cardiovascular Disease

## 2011-09-22 VITALS — BP 135/65 | HR 60 | Ht 72.0 in | Wt 209.0 lb

## 2011-09-22 DIAGNOSIS — Z950 Presence of cardiac pacemaker: Secondary | ICD-10-CM

## 2011-09-22 DIAGNOSIS — I4821 Permanent atrial fibrillation: Secondary | ICD-10-CM

## 2011-09-22 DIAGNOSIS — I4891 Unspecified atrial fibrillation: Secondary | ICD-10-CM

## 2011-09-22 NOTE — Assessment & Plan Note (Signed)
Goal INR 2-3 He had labile INR in the past but it appears to be improved recently. I discussed with him other forms of anticoagulation that can be considered. The patient will think about this and let us know if he is interested in switching from warfarin.

## 2011-09-22 NOTE — Assessment & Plan Note (Signed)
Followed by our device clinic and seems to be functioning normally.

## 2011-09-22 NOTE — Progress Notes (Signed)
HPI  The patient is a 66 year old male who is here today for followup visit.he has a history of chronic atrial fibrillation, status post AV nodal ablation and permanent pacemaker implantation. He has type 2 diabetes for many years. He has no known history of coronary artery disease. He underwent cardiac catheterization twice most recently in January of 2012 which showed minor irregularities with normal ejection fraction.  He seems to be doing well at this time. He reports no chest pain, dyspnea or palpitations.   Allergies  Allergen Reactions  . Shellfish Allergy   . Sulfonamide Derivatives      Current Outpatient Prescriptions on File Prior to Visit  Medication Sig Dispense Refill  . ALPRAZolam (XANAX) 0.5 MG tablet Take 0.5 mg by mouth at bedtime.        Marland Kitchen aspirin EC 81 MG tablet Take 81 mg by mouth daily.        . cholecalciferol (VITAMIN D) 1000 UNITS tablet Take 1,000 Units by mouth daily.        . fish oil-omega-3 fatty acids 1000 MG capsule Take 1 g by mouth 2 (two) times daily.        Marland Kitchen glyBURIDE (DIABETA) 5 MG tablet Take 2.5 mg by mouth 2 (two) times daily with a meal.        . IMDUR 60 MG 24 hr tablet TAKE 1 & 1/2 TABLET BY MOUTH ONCE DAILY.  45 each  6  . metFORMIN (GLUCOPHAGE) 1000 MG tablet Take 1,000 mg by mouth 2 (two) times daily with a meal.        . metoprolol tartrate (LOPRESSOR) 25 MG tablet Take 12.5 mg by mouth 2 (two) times daily.        . Multiple Vitamin (MULTIVITAMIN) tablet Take 1 tablet by mouth daily.        . nitroGLYCERIN (NITROSTAT) 0.4 MG SL tablet Place 0.4 mg under the tongue every 5 (five) minutes as needed.        . pioglitazone (ACTOS) 15 MG tablet Take 15 mg by mouth daily.        . ramipril (ALTACE) 5 MG capsule Take 5 mg by mouth daily.        . sertraline (ZOLOFT) 50 MG tablet Take 1/2 tab daily      . simvastatin (ZOCOR) 40 MG tablet Take 40 mg by mouth at bedtime.        Marland Kitchen warfarin (COUMADIN) 5 MG tablet Take 5 mg by mouth as directed.            Past Medical History  Diagnosis Date  . Coronary artery disease     mild nonobstructive  . Atrial fibrillation or flutter     permanent AF  . S/P AV nodal ablation     s/p SJM PPM  . Warfarin anticoagulation   . S/P cardiac catheterization 03/30/2010    mild nonobstructive coronary artery disease, normal left ventricular systolic function, possible microvessel disease causing the patient's complaint of exertional chest pain  . Lower extremity edema     history  . Hiatal hernia   . History of chest pain     resolved  . Dyspnea     h/o  resolved     Past Surgical History  Procedure Date  . Pacemaker placement 2002    SJM implanted by Dr Ladona Ridgel with AV nodal ablation performed  . Cardiac catheterization 03/2010    nonobstructive coronary artery disease, normal LV function.  No family history on file.   History   Social History  . Marital Status: Married    Spouse Name: N/A    Number of Children: N/A  . Years of Education: N/A   Occupational History  . Not on file.   Social History Main Topics  . Smoking status: Never Smoker   . Smokeless tobacco: Never Used  . Alcohol Use: No  . Drug Use: No  . Sexually Active: Not on file   Other Topics Concern  . Not on file   Social History Narrative  . No narrative on file     PHYSICAL EXAM   BP 135/65  Pulse 60  Ht 6' (1.829 m)  Wt 209 lb (94.802 kg)  BMI 28.35 kg/m2  Constitutional: He is oriented to person, place, and time. He appears well-developed and well-nourished. No distress.  HENT: No nasal discharge.  Head: Normocephalic and atraumatic.  Eyes: Pupils are equal and round. Right eye exhibits no discharge. Left eye exhibits no discharge.  Neck: Normal range of motion. Neck supple. No JVD present. No thyromegaly present.  Cardiovascular: Normal rate, regular rhythm, normal heart sounds and. Exam reveals no gallop and no friction rub. No murmur heard.  Pulmonary/Chest: Effort normal and  breath sounds normal. No stridor. No respiratory distress. He has no wheezes. He has no rales. He exhibits no tenderness.  Abdominal: Soft. Bowel sounds are normal. He exhibits no distension. There is no tenderness. There is no rebound and no guarding.  Musculoskeletal: Normal range of motion. He exhibits no edema and no tenderness.  Neurological: He is alert and oriented to person, place, and time. Coordination normal.  Skin: Skin is warm and dry. No rash noted. He is not diaphoretic. No erythema. No pallor.  Psychiatric: He has a normal mood and affect. His behavior is normal. Judgment and thought content normal.      EKG: Atrial fibrillation with ventricular paced rhythm.   ASSESSMENT AND PLAN

## 2011-09-22 NOTE — Patient Instructions (Addendum)
You are doing well. Continue same medications.  Follow up in 6 months.   

## 2011-09-26 ENCOUNTER — Other Ambulatory Visit: Payer: Self-pay | Admitting: Cardiology

## 2011-09-26 MED ORDER — ISOSORBIDE MONONITRATE ER 60 MG PO TB24: 90.0000 mg | ORAL_TABLET | Freq: Every day | ORAL | Status: DC

## 2011-12-16 DIAGNOSIS — I495 Sick sinus syndrome: Secondary | ICD-10-CM

## 2012-01-06 ENCOUNTER — Ambulatory Visit (INDEPENDENT_AMBULATORY_CARE_PROVIDER_SITE_OTHER): Payer: Medicare Other | Admitting: Cardiology

## 2012-01-06 ENCOUNTER — Encounter: Payer: Self-pay | Admitting: Cardiology

## 2012-01-06 VITALS — BP 144/77 | HR 60 | Ht 72.0 in | Wt 215.0 lb

## 2012-01-06 DIAGNOSIS — I4821 Permanent atrial fibrillation: Secondary | ICD-10-CM

## 2012-01-06 DIAGNOSIS — R072 Precordial pain: Secondary | ICD-10-CM

## 2012-01-06 DIAGNOSIS — I4891 Unspecified atrial fibrillation: Secondary | ICD-10-CM

## 2012-01-06 DIAGNOSIS — I2510 Atherosclerotic heart disease of native coronary artery without angina pectoris: Secondary | ICD-10-CM

## 2012-01-06 NOTE — Progress Notes (Signed)
Clinical Summary Jay Campbell is a 66 y.o.male presenting for followup. He is a former patient of Dr. Andee Lineman, last seen in July by Dr. Kirke Corin.  He is referred back to the office by Dr. Sherryll Burger. Describes episodes of chest pain within the last two weeks, not clearly NTG responsive. He had a cardiac catheterization in 1/12 that showed only mild atherosclerosis. No palpitations, no problems with cardiac medications or bleeding on Coumadin.  ECG shows ventricular paced rhythm with underlying atrial fibrillation.  We discussed options of followup stress testing versus observation for now and he preferred observation. Imdur was advanced at the visit with Dr. Sherryll Burger.   Allergies  Allergen Reactions  . Shellfish Allergy   . Sulfonamide Derivatives     Current Outpatient Prescriptions  Medication Sig Dispense Refill  . allopurinol (ZYLOPRIM) 100 MG tablet Take 1 tablet by mouth Daily.      Marland Kitchen ALPRAZolam (XANAX) 0.5 MG tablet Take 0.5 mg by mouth at bedtime.        Marland Kitchen aspirin EC 81 MG tablet Take 81 mg by mouth daily.        . cholecalciferol (VITAMIN D) 1000 UNITS tablet Take 1,000 Units by mouth daily.        . fish oil-omega-3 fatty acids 1000 MG capsule Take 1 g by mouth 2 (two) times daily.        Marland Kitchen glyBURIDE (DIABETA) 5 MG tablet Take 2.5 mg by mouth 2 (two) times daily with a meal.        . isosorbide mononitrate (IMDUR) 60 MG 24 hr tablet Take 120 mg by mouth daily.      . metFORMIN (GLUCOPHAGE) 1000 MG tablet Take 1,000 mg by mouth 2 (two) times daily with a meal.        . metoprolol succinate (TOPROL-XL) 25 MG 24 hr tablet Take 0.5 tablets by mouth Daily.      . Multiple Vitamin (MULTIVITAMIN) tablet Take 1 tablet by mouth daily.        . nitroGLYCERIN (NITROSTAT) 0.4 MG SL tablet Place 0.4 mg under the tongue every 5 (five) minutes as needed.        . pioglitazone (ACTOS) 15 MG tablet Take 15 mg by mouth daily.        . ramipril (ALTACE) 5 MG capsule Take 5 mg by mouth daily.        .  ranitidine (ZANTAC) 150 MG tablet Take 1 tablet by mouth Daily.      . sertraline (ZOLOFT) 50 MG tablet Take 1/2 tab daily      . simvastatin (ZOCOR) 40 MG tablet Take 40 mg by mouth at bedtime.        Marland Kitchen warfarin (COUMADIN) 5 MG tablet Take 5 mg by mouth as directed.        Marland Kitchen DISCONTD: isosorbide mononitrate (IMDUR) 60 MG 24 hr tablet Take 1.5 tablets (90 mg total) by mouth daily.  45 tablet  6    Past Medical History  Diagnosis Date  . Coronary artery disease     Mild nonobstructive 1/12  . Atrial fibrillation     Permanent  . S/P AV nodal ablation     s/p SJM PPM  . Warfarin anticoagulation   . Hiatal hernia     Past Surgical History  Procedure Date  . Pacemaker placement 2002    SJM implanted by Dr Ladona Ridgel with AV nodal ablation performed    Social History Mr. Grima reports that he has never smoked.  He has never used smokeless tobacco. Mr. Tanguma reports that he does not drink alcohol.  Review of Systems Negative except as outlined above.  Physical Examination Filed Vitals:   01/06/12 1423  BP: 144/77  Pulse: 60   Filed Weights   01/06/12 1423  Weight: 215 lb (97.523 kg)   No acute distress. HEENT: Conjunctiva and lids normal, oropharynx clear. Neck: Supple, no elevated JVP or carotid bruits, no thyromegaly. Lungs: Clear to auscultation, nonlabored breathing at rest. Cardiac: Regular rate and rhythm, no S3 or significant systolic murmur, no pericardial rub. Abdomen: Soft, bowel sounds present. Extremities: No pitting edema, distal pulses 2+.  Problem List and Plan   Precordial pain Recurrence as noted. Plan observation on medical therapy for now as outlined. If progressive would consider followup Lexiscan Cardiolite sooner. Keep visit as scheduled.  CORONARY ATHEROSCLEROSIS NATIVE CORONARY ARTERY Mild atherosclerosis by catheterization in 1/12.  Permanent atrial fibrillation On Coumadin status post AVN ablation.    Jonelle Sidle, M.D.,  F.A.C.C.

## 2012-01-06 NOTE — Patient Instructions (Addendum)
Your physician recommends that you schedule a follow-up appointment as scheduled   Your physician recommends that you continue on your current medications as directed. Please refer to the Current Medication list given to you today.   

## 2012-01-06 NOTE — Assessment & Plan Note (Signed)
On Coumadin status post AVN ablation.

## 2012-01-06 NOTE — Assessment & Plan Note (Signed)
Recurrence as noted. Plan observation on medical therapy for now as outlined. If progressive would consider followup Lexiscan Cardiolite sooner. Keep visit as scheduled.

## 2012-01-06 NOTE — Assessment & Plan Note (Signed)
Mild atherosclerosis by catheterization in 1/12.

## 2012-01-12 NOTE — Addendum Note (Signed)
Addended by: Eustace Moore on: 01/12/2012 08:10 AM   Modules accepted: Orders

## 2012-03-20 ENCOUNTER — Ambulatory Visit: Payer: Medicare Other | Admitting: Cardiovascular Disease

## 2012-03-21 ENCOUNTER — Encounter: Payer: Self-pay | Admitting: Internal Medicine

## 2012-03-21 DIAGNOSIS — I495 Sick sinus syndrome: Secondary | ICD-10-CM

## 2012-03-26 ENCOUNTER — Ambulatory Visit (INDEPENDENT_AMBULATORY_CARE_PROVIDER_SITE_OTHER): Payer: Medicare Other | Admitting: Cardiology

## 2012-03-26 ENCOUNTER — Encounter: Payer: Self-pay | Admitting: Cardiology

## 2012-03-26 ENCOUNTER — Encounter: Payer: Medicare Other | Admitting: Internal Medicine

## 2012-03-26 VITALS — BP 132/82 | HR 67 | Ht 72.0 in | Wt 213.0 lb

## 2012-03-26 DIAGNOSIS — I4891 Unspecified atrial fibrillation: Secondary | ICD-10-CM

## 2012-03-26 DIAGNOSIS — I2510 Atherosclerotic heart disease of native coronary artery without angina pectoris: Secondary | ICD-10-CM

## 2012-03-26 DIAGNOSIS — Z950 Presence of cardiac pacemaker: Secondary | ICD-10-CM

## 2012-03-26 DIAGNOSIS — I4821 Permanent atrial fibrillation: Secondary | ICD-10-CM

## 2012-03-26 NOTE — Progress Notes (Signed)
Clinical Summary Jay Campbell is a 67 y.o.male presenting for followup. He was seen in October 2013. He is here with his wife today, states that he has been feeling well. Still enjoys hunting, reports no significant palpitations, no bleeding problems, no angina.  He continues with transtelephonic device checks, is due to see Dr. Johney Frame next month. He has permanent atrial fibrillation status post AV nodal ablation with paced rhythm at baseline.   Allergies  Allergen Reactions  . Shellfish Allergy   . Sulfonamide Derivatives     Current Outpatient Prescriptions  Medication Sig Dispense Refill  . allopurinol (ZYLOPRIM) 100 MG tablet Take 1 tablet by mouth Daily.      Marland Kitchen ALPRAZolam (XANAX) 0.5 MG tablet Take 0.5 mg by mouth at bedtime.        Marland Kitchen aspirin EC 81 MG tablet Take 81 mg by mouth daily.        . cholecalciferol (VITAMIN D) 1000 UNITS tablet Take 1,000 Units by mouth daily.        . fish oil-omega-3 fatty acids 1000 MG capsule Take 1 g by mouth 2 (two) times daily.        Marland Kitchen glyBURIDE (DIABETA) 5 MG tablet Take 2.5 mg by mouth 2 (two) times daily with a meal.        . isosorbide mononitrate (IMDUR) 60 MG 24 hr tablet Take 120 mg by mouth daily.      . metFORMIN (GLUCOPHAGE) 1000 MG tablet Take 1,000 mg by mouth 2 (two) times daily with a meal.        . metoprolol succinate (TOPROL-XL) 25 MG 24 hr tablet Take 0.5 tablets by mouth Daily.      . Multiple Vitamin (MULTIVITAMIN) tablet Take 1 tablet by mouth daily.        . nitroGLYCERIN (NITROSTAT) 0.4 MG SL tablet Place 0.4 mg under the tongue every 5 (five) minutes as needed.        . pioglitazone (ACTOS) 15 MG tablet Take 15 mg by mouth daily.        . ramipril (ALTACE) 5 MG capsule Take 5 mg by mouth daily.        . ranitidine (ZANTAC) 150 MG tablet Take 1 tablet by mouth 2 (two) times daily.       . sertraline (ZOLOFT) 50 MG tablet Take 1/2 tab daily      . simvastatin (ZOCOR) 40 MG tablet Take 40 mg by mouth at bedtime.        Marland Kitchen  warfarin (COUMADIN) 5 MG tablet Take 5 mg by mouth as directed.          Past Medical History  Diagnosis Date  . Coronary artery disease     Mild nonobstructive 1/12  . Atrial fibrillation     Permanent  . S/P AV nodal ablation     s/p SJM PPM  . Warfarin anticoagulation   . Hiatal hernia     Past Surgical History  Procedure Date  . Pacemaker placement 2002    SJM implanted by Dr Ladona Ridgel with AV nodal ablation performed    Social History Jay Campbell reports that he has never smoked. He has never used smokeless tobacco. Jay Campbell reports that he does not drink alcohol.  Review of Systems Negative except as outlined above.  Physical Examination Filed Vitals:   03/26/12 0820  BP: 132/82  Pulse: 67   Filed Weights   03/26/12 0820  Weight: 213 lb (96.616 kg)    No  acute distress.  HEENT: Conjunctiva and lids normal, oropharynx clear.  Neck: Supple, no elevated JVP or carotid bruits, no thyromegaly.  Lungs: Clear to auscultation, nonlabored breathing at rest.  Cardiac: Regular rate and rhythm, no S3 or significant systolic murmur, no pericardial rub.  Abdomen: Soft, bowel sounds present.  Extremities: No pitting edema, distal pulses 2+.   Problem List and Plan   Permanent atrial fibrillation Symptomatically stable, doing well on current medical regimen. No bleeding problems.  CARDIAC PACEMAKER IN SITU Status post AV nodal ablation, for device check with Dr. Johney Frame next month.  CORONARY ATHEROSCLEROSIS NATIVE CORONARY ARTERY No active angina, nonobstructive disease with small vessel disease related to diabetes mellitus. Uses nitroglycerin only rarely. Continue medical therapy including aspirin.    Jonelle Sidle, M.D., F.A.C.C.

## 2012-03-26 NOTE — Patient Instructions (Addendum)

## 2012-03-26 NOTE — Assessment & Plan Note (Signed)
No active angina, nonobstructive disease with small vessel disease related to diabetes mellitus. Uses nitroglycerin only rarely. Continue medical therapy including aspirin.

## 2012-03-26 NOTE — Assessment & Plan Note (Signed)
Symptomatically stable, doing well on current medical regimen. No bleeding problems.

## 2012-03-26 NOTE — Assessment & Plan Note (Signed)
Status post AV nodal ablation, for device check with Dr. Johney Frame next month.

## 2012-04-11 ENCOUNTER — Encounter: Payer: Medicare Other | Admitting: Internal Medicine

## 2012-04-23 ENCOUNTER — Ambulatory Visit (INDEPENDENT_AMBULATORY_CARE_PROVIDER_SITE_OTHER): Payer: Medicare Other | Admitting: Internal Medicine

## 2012-04-23 ENCOUNTER — Encounter: Payer: Self-pay | Admitting: Internal Medicine

## 2012-04-23 VITALS — BP 107/68 | HR 68 | Ht 72.0 in | Wt 215.0 lb

## 2012-04-23 DIAGNOSIS — Z9889 Other specified postprocedural states: Secondary | ICD-10-CM

## 2012-04-23 DIAGNOSIS — Z7901 Long term (current) use of anticoagulants: Secondary | ICD-10-CM

## 2012-04-23 DIAGNOSIS — I4891 Unspecified atrial fibrillation: Secondary | ICD-10-CM

## 2012-04-23 DIAGNOSIS — I4821 Permanent atrial fibrillation: Secondary | ICD-10-CM

## 2012-04-23 LAB — PACEMAKER DEVICE OBSERVATION: RV LEAD THRESHOLD: 1.25 V

## 2012-04-23 NOTE — Progress Notes (Signed)
PCP:  Kirstie Peri, MD  The patient presents today for routine electrophysiology followup.  Since last being seen in our clinic, the patient reports doing very well.  Today, he denies symptoms of palpitations, exertional chest pain, shortness of breath, orthopnea, PND, lower extremity edema, dizziness, presyncope, syncope, or neurologic sequela.  The patient feels that he is tolerating medications without difficulties and is otherwise without complaint today.   Past Medical History  Diagnosis Date  . Coronary artery disease     Mild nonobstructive 1/12  . Atrial fibrillation     Permanent  . S/P AV nodal ablation     s/p SJM PPM  . Warfarin anticoagulation   . Hiatal hernia    Past Surgical History  Procedure Laterality Date  . Pacemaker placement  2002    SJM implanted by Dr Ladona Ridgel with AV nodal ablation performed    Current Outpatient Prescriptions  Medication Sig Dispense Refill  . allopurinol (ZYLOPRIM) 100 MG tablet Take 1 tablet by mouth Daily.      Marland Kitchen ALPRAZolam (XANAX) 0.5 MG tablet Take 0.5 mg by mouth at bedtime.        Marland Kitchen aspirin EC 81 MG tablet Take 81 mg by mouth daily.        . cholecalciferol (VITAMIN D) 1000 UNITS tablet Take 1,000 Units by mouth daily.        Marland Kitchen COLCRYS 0.6 MG tablet Take 0.6 mg by mouth as needed.       . fish oil-omega-3 fatty acids 1000 MG capsule Take 1 g by mouth 2 (two) times daily.        Marland Kitchen glyBURIDE (DIABETA) 5 MG tablet Take 2.5 mg by mouth 2 (two) times daily with a meal.        . isosorbide mononitrate (IMDUR) 60 MG 24 hr tablet Take 120 mg by mouth daily.      . metFORMIN (GLUCOPHAGE) 1000 MG tablet Take 1,000 mg by mouth 2 (two) times daily with a meal.        . metoprolol succinate (TOPROL-XL) 25 MG 24 hr tablet Take 0.5 tablets by mouth Daily.      . Multiple Vitamin (MULTIVITAMIN) tablet Take 1 tablet by mouth daily.        . nitroGLYCERIN (NITROSTAT) 0.4 MG SL tablet Place 0.4 mg under the tongue every 5 (five) minutes as needed.         . pioglitazone (ACTOS) 15 MG tablet Take 15 mg by mouth daily.        . ramipril (ALTACE) 5 MG capsule Take 5 mg by mouth daily.        . ranitidine (ZANTAC) 150 MG tablet Take 1 tablet by mouth 2 (two) times daily.       . sertraline (ZOLOFT) 50 MG tablet Take 1/2 tab daily      . simvastatin (ZOCOR) 40 MG tablet Take 40 mg by mouth at bedtime.        Marland Kitchen warfarin (COUMADIN) 5 MG tablet Take 5 mg by mouth as directed.         No current facility-administered medications for this visit.    Allergies  Allergen Reactions  . Shellfish Allergy   . Sulfonamide Derivatives     History   Social History  . Marital Status: Married    Spouse Name: N/A    Number of Children: N/A  . Years of Education: N/A   Occupational History  . Not on file.   Social History Main Topics  .  Smoking status: Never Smoker   . Smokeless tobacco: Never Used  . Alcohol Use: No  . Drug Use: No  . Sexually Active: Not on file   Other Topics Concern  . Not on file   Social History Narrative  . No narrative on file     Physical Exam: Filed Vitals:   04/23/12 0955  BP: 107/68  Pulse: 68  Height: 6' (1.829 m)  Weight: 215 lb (97.523 kg)  SpO2: 97%    GEN- The patient is well appearing, alert and oriented x 3 today.   Head- normocephalic, atraumatic Eyes-  Sclera clear, conjunctiva pink Ears- hearing intact Oropharynx- clear Neck- supple, no JVP Lymph- no cervical lymphadenopathy Lungs- Clear to ausculation bilaterally, normal work of breathing Chest- pacemaker pocket is well healed Heart- Regular rate and rhythm (paced) GI- soft, NT, ND, + BS Extremities- no clubbing, cyanosis, or edema  Pacemaker interrogation- reviewed in detail today,  See PACEART report  Assessment and Plan:  1. Afib S/p AV nodal ablation Continue coumadin Stop ASA   2. Complete heart block Normal pacemaker function See Pace Art report No changes today Estimated longevity is 8 months He will return to the  device clinic in 2 months

## 2012-04-23 NOTE — Patient Instructions (Signed)
   Stop Aspirin  Belenda Cruise - 2 months Continue all other current medications.

## 2012-05-14 ENCOUNTER — Encounter: Payer: Self-pay | Admitting: Internal Medicine

## 2012-06-22 ENCOUNTER — Other Ambulatory Visit: Payer: Self-pay | Admitting: Internal Medicine

## 2012-06-22 ENCOUNTER — Ambulatory Visit (INDEPENDENT_AMBULATORY_CARE_PROVIDER_SITE_OTHER): Payer: Medicare Other | Admitting: *Deleted

## 2012-06-22 DIAGNOSIS — Z950 Presence of cardiac pacemaker: Secondary | ICD-10-CM

## 2012-06-22 DIAGNOSIS — I4891 Unspecified atrial fibrillation: Secondary | ICD-10-CM

## 2012-06-22 DIAGNOSIS — I4821 Permanent atrial fibrillation: Secondary | ICD-10-CM

## 2012-06-22 LAB — PACEMAKER DEVICE OBSERVATION

## 2012-06-22 NOTE — Progress Notes (Signed)
Pacemaker check in clinic for battery. Battery longevity 6 months with 100% pacing. ERI voltage 2.50 V. ROV in 1 month with device clinic.

## 2012-07-12 ENCOUNTER — Encounter: Payer: Self-pay | Admitting: Internal Medicine

## 2012-07-20 ENCOUNTER — Ambulatory Visit (INDEPENDENT_AMBULATORY_CARE_PROVIDER_SITE_OTHER): Payer: Medicare Other | Admitting: *Deleted

## 2012-07-20 ENCOUNTER — Other Ambulatory Visit: Payer: Self-pay | Admitting: Internal Medicine

## 2012-07-20 DIAGNOSIS — I4891 Unspecified atrial fibrillation: Secondary | ICD-10-CM

## 2012-07-20 DIAGNOSIS — I4821 Permanent atrial fibrillation: Secondary | ICD-10-CM

## 2012-07-20 LAB — PACEMAKER DEVICE OBSERVATION: DEVICE MODEL PM: 510526

## 2012-07-20 NOTE — Progress Notes (Signed)
Pacemaker check in clinic for battery check. Battery voltage 2.67 V. ROV in 1 month for device clinic.

## 2012-08-31 ENCOUNTER — Ambulatory Visit (INDEPENDENT_AMBULATORY_CARE_PROVIDER_SITE_OTHER): Payer: Medicare Other | Admitting: *Deleted

## 2012-08-31 DIAGNOSIS — I4891 Unspecified atrial fibrillation: Secondary | ICD-10-CM

## 2012-08-31 DIAGNOSIS — I4821 Permanent atrial fibrillation: Secondary | ICD-10-CM

## 2012-08-31 LAB — PACEMAKER DEVICE OBSERVATION
BATTERY VOLTAGE: 2.64 V
DEVICE MODEL PM: 510526

## 2012-08-31 NOTE — Progress Notes (Signed)
PPM battery check only in clinic.

## 2012-09-06 ENCOUNTER — Encounter: Payer: Self-pay | Admitting: Internal Medicine

## 2012-10-01 ENCOUNTER — Ambulatory Visit (INDEPENDENT_AMBULATORY_CARE_PROVIDER_SITE_OTHER): Payer: Medicare Other | Admitting: *Deleted

## 2012-10-01 DIAGNOSIS — I442 Atrioventricular block, complete: Secondary | ICD-10-CM

## 2012-10-01 LAB — PACEMAKER DEVICE OBSERVATION: DEVICE MODEL PM: 510526

## 2012-10-01 NOTE — Progress Notes (Signed)
Pacemaker check in clinic for battery. Battery voltage 2.61 V. Per Marylene Land at [facility] 1 month with 100% pacing to ERI. ROV in 1 mth w/JA in [place] office.

## 2012-10-04 ENCOUNTER — Encounter: Payer: Self-pay | Admitting: Internal Medicine

## 2012-10-19 ENCOUNTER — Encounter: Payer: Self-pay | Admitting: Internal Medicine

## 2012-11-14 ENCOUNTER — Encounter: Payer: Medicare Other | Admitting: Internal Medicine

## 2012-11-22 ENCOUNTER — Ambulatory Visit (INDEPENDENT_AMBULATORY_CARE_PROVIDER_SITE_OTHER): Payer: Medicare Other | Admitting: Internal Medicine

## 2012-11-22 ENCOUNTER — Encounter: Payer: Self-pay | Admitting: Internal Medicine

## 2012-11-22 VITALS — BP 131/73 | HR 66 | Ht 72.0 in | Wt 205.0 lb

## 2012-11-22 DIAGNOSIS — I4891 Unspecified atrial fibrillation: Secondary | ICD-10-CM

## 2012-11-22 DIAGNOSIS — I4821 Permanent atrial fibrillation: Secondary | ICD-10-CM

## 2012-11-22 DIAGNOSIS — Z950 Presence of cardiac pacemaker: Secondary | ICD-10-CM

## 2012-11-22 DIAGNOSIS — Z9889 Other specified postprocedural states: Secondary | ICD-10-CM

## 2012-11-22 DIAGNOSIS — Z7901 Long term (current) use of anticoagulants: Secondary | ICD-10-CM

## 2012-11-22 LAB — PACEMAKER DEVICE OBSERVATION
BATTERY VOLTAGE: 2.59 V
BRDY-0002RV: 60 {beats}/min
BRDY-0004RV: 120 {beats}/min
DEVICE MODEL PM: 510526

## 2012-11-22 NOTE — Progress Notes (Signed)
PCP:  Kirstie Peri, MD Primary Cardiologist:  The patient presents today for routine electrophysiology followup.  Since last being seen in our clinic, the patient reports doing very well.  Today, he denies symptoms of palpitations, exertional chest pain, shortness of breath, orthopnea, PND, lower extremity edema, dizziness, presyncope, syncope, or neurologic sequela.  The patient feels that he is tolerating medications without difficulties and is otherwise without complaint today.   Past Medical History  Diagnosis Date  . Coronary artery disease     Mild nonobstructive 1/12  . Atrial fibrillation     Permanent  . S/P AV nodal ablation     s/p SJM PPM  . Warfarin anticoagulation   . Hiatal hernia    Past Surgical History  Procedure Laterality Date  . Pacemaker placement  2002    SJM implanted by Dr Ladona Ridgel with AV nodal ablation performed    Current Outpatient Prescriptions  Medication Sig Dispense Refill  . allopurinol (ZYLOPRIM) 100 MG tablet Take 1 tablet by mouth Daily.      Marland Kitchen ALPRAZolam (XANAX) 0.5 MG tablet Take 0.5 mg by mouth at bedtime.        . cholecalciferol (VITAMIN D) 1000 UNITS tablet Take 1,000 Units by mouth daily.        Marland Kitchen COLCRYS 0.6 MG tablet Take 0.6 mg by mouth as needed.       . fish oil-omega-3 fatty acids 1000 MG capsule Take 1 g by mouth 2 (two) times daily.        Marland Kitchen glipiZIDE (GLUCOTROL) 2.5 mg TABS tablet Take 2.5 mg by mouth 2 (two) times daily before a meal.      . isosorbide mononitrate (IMDUR) 60 MG 24 hr tablet Take 120 mg by mouth daily.      . metFORMIN (GLUCOPHAGE) 1000 MG tablet Take 1,000 mg by mouth 2 (two) times daily with a meal.        . metoprolol succinate (TOPROL-XL) 25 MG 24 hr tablet Take 0.5 tablets by mouth Daily.      . Multiple Vitamin (MULTIVITAMIN) tablet Take 1 tablet by mouth daily.        . nitroGLYCERIN (NITROSTAT) 0.4 MG SL tablet Place 0.4 mg under the tongue every 5 (five) minutes as needed.        . pantoprazole (PROTONIX)  40 MG tablet Take 1 tablet by mouth daily.      . pioglitazone (ACTOS) 15 MG tablet Take 15 mg by mouth daily.        . ramipril (ALTACE) 5 MG capsule Take 5 mg by mouth daily.        . sertraline (ZOLOFT) 50 MG tablet Take 1/2 tab daily      . simvastatin (ZOCOR) 40 MG tablet Take 40 mg by mouth at bedtime.        Marland Kitchen warfarin (COUMADIN) 5 MG tablet Take 5 mg by mouth as directed.         No current facility-administered medications for this visit.    Allergies  Allergen Reactions  . Shellfish Allergy   . Sulfonamide Derivatives     History   Social History  . Marital Status: Married    Spouse Name: N/A    Number of Children: N/A  . Years of Education: N/A   Occupational History  . Not on file.   Social History Main Topics  . Smoking status: Never Smoker   . Smokeless tobacco: Never Used  . Alcohol Use: No  . Drug Use: No  .  Sexual Activity: Not on file   Other Topics Concern  . Not on file   Social History Narrative  . No narrative on file     Physical Exam: Filed Vitals:   11/22/12 1001  BP: 131/73  Pulse: 66  Height: 6' (1.829 m)  Weight: 205 lb (92.987 kg)    GEN- The patient is well appearing, alert and oriented x 3 today.   Head- normocephalic, atraumatic Eyes-  Sclera clear, conjunctiva pink Ears- hearing intact Oropharynx- clear Neck- supple, no JVP Lymph- no cervical lymphadenopathy Lungs- Clear to ausculation bilaterally, normal work of breathing Chest- pacemaker pocket is well healed Heart- Regular rate and rhythm (paced) GI- soft, NT, ND, + BS Extremities- no clubbing, cyanosis, or edema  Pacemaker interrogation- reviewed in detail today,  See PACEART report  Assessment and Plan:  1. Afib S/p AV nodal ablation Continue coumadin  2. Complete heart block Normal pacemaker function See Pace Art report No changes today Estimated longevity is 1 month He will return to the device clinic in 1 month Risks, benefits, and alternatives to  PPM pulse generator replacement were discussed in detail with the patient and his spouse.  They understand and would like to proceed once he reaches ERI battery status.  I therefore do not need to see him prior to the procedure.  Once he reaches ERI, he should be scheduled to have generator change by me and I will discuss risks again in short stay.  OK to hold coumadin for 48 hours prior to the procedure.  Return to see Belenda Cruise in 1 month

## 2012-11-22 NOTE — Patient Instructions (Signed)
Continue all current medications. 1 month - device check  Dr. Diona Browner - 2 months

## 2012-11-28 ENCOUNTER — Ambulatory Visit: Payer: Medicare Other | Admitting: Cardiology

## 2012-12-05 ENCOUNTER — Ambulatory Visit: Payer: Medicare Other | Admitting: Cardiology

## 2012-12-21 ENCOUNTER — Encounter: Payer: Self-pay | Admitting: Internal Medicine

## 2012-12-21 ENCOUNTER — Ambulatory Visit (INDEPENDENT_AMBULATORY_CARE_PROVIDER_SITE_OTHER): Payer: Medicare Other | Admitting: *Deleted

## 2012-12-21 DIAGNOSIS — I442 Atrioventricular block, complete: Secondary | ICD-10-CM

## 2012-12-21 LAB — PACEMAKER DEVICE OBSERVATION: DEVICE MODEL PM: 510526

## 2012-12-21 NOTE — Progress Notes (Signed)
Pacemaker check in clinic for battery check. Battery voltage 2.54 V. Per SJM 0 mth with 100% pacing. ROV in 1 mth w/JA.

## 2013-01-03 ENCOUNTER — Ambulatory Visit (INDEPENDENT_AMBULATORY_CARE_PROVIDER_SITE_OTHER): Payer: Medicare Other | Admitting: Internal Medicine

## 2013-01-03 ENCOUNTER — Encounter: Payer: Self-pay | Admitting: Internal Medicine

## 2013-01-03 VITALS — BP 128/78 | HR 67 | Ht 72.0 in | Wt 203.0 lb

## 2013-01-03 DIAGNOSIS — Z9889 Other specified postprocedural states: Secondary | ICD-10-CM

## 2013-01-03 DIAGNOSIS — I4821 Permanent atrial fibrillation: Secondary | ICD-10-CM

## 2013-01-03 DIAGNOSIS — I4891 Unspecified atrial fibrillation: Secondary | ICD-10-CM

## 2013-01-03 NOTE — Progress Notes (Signed)
PCP:  Kirstie Peri, MD  The patient presents today for routine electrophysiology followup. He is without complaint today. The patient feels that he is tolerating medications without difficulties and is otherwise without complaint today.   Past Medical History  Diagnosis Date  . Coronary artery disease     Mild nonobstructive 1/12  . Atrial fibrillation     Permanent  . S/P AV nodal ablation     s/p SJM PPM  . Warfarin anticoagulation   . Hiatal hernia    Past Surgical History  Procedure Laterality Date  . Pacemaker placement  2002    SJM implanted by Dr Ladona Ridgel with AV nodal ablation performed    Current Outpatient Prescriptions  Medication Sig Dispense Refill  . allopurinol (ZYLOPRIM) 100 MG tablet Take 1 tablet by mouth Daily.      Marland Kitchen ALPRAZolam (XANAX) 0.5 MG tablet Take 0.5 mg by mouth at bedtime.        . cholecalciferol (VITAMIN D) 1000 UNITS tablet Take 1,000 Units by mouth daily.        Marland Kitchen COLCRYS 0.6 MG tablet Take 0.6 mg by mouth as needed.       . fish oil-omega-3 fatty acids 1000 MG capsule Take 1 g by mouth 2 (two) times daily.        Marland Kitchen glipiZIDE (GLUCOTROL) 2.5 mg TABS tablet Take 2.5 mg by mouth 2 (two) times daily before a meal.      . isosorbide mononitrate (IMDUR) 60 MG 24 hr tablet Take 120 mg by mouth daily.      . metFORMIN (GLUCOPHAGE) 1000 MG tablet Take 1,000 mg by mouth 2 (two) times daily with a meal.        . metoprolol succinate (TOPROL-XL) 25 MG 24 hr tablet Take 0.5 tablets by mouth Daily.      . Multiple Vitamin (MULTIVITAMIN) tablet Take 1 tablet by mouth daily.        . nitroGLYCERIN (NITROSTAT) 0.4 MG SL tablet Place 0.4 mg under the tongue every 5 (five) minutes as needed.        . pantoprazole (PROTONIX) 40 MG tablet Take 1 tablet by mouth daily.      . ramipril (ALTACE) 5 MG capsule Take 5 mg by mouth daily.        . sertraline (ZOLOFT) 50 MG tablet Take 1/2 tab daily      . simvastatin (ZOCOR) 40 MG tablet Take 40 mg by mouth at bedtime.         . sitaGLIPtin (JANUVIA) 50 MG tablet Take 50 mg by mouth daily.      Marland Kitchen warfarin (COUMADIN) 5 MG tablet Take 5 mg by mouth as directed.         No current facility-administered medications for this visit.    Allergies  Allergen Reactions  . Shellfish Allergy   . Sulfonamide Derivatives     History   Social History  . Marital Status: Married    Spouse Name: N/A    Number of Children: N/A  . Years of Education: N/A   Occupational History  . Not on file.   Social History Main Topics  . Smoking status: Never Smoker   . Smokeless tobacco: Never Used  . Alcohol Use: No  . Drug Use: No  . Sexual Activity: Not on file   Other Topics Concern  . Not on file   Social History Narrative  . No narrative on file     Physical Exam: Filed Vitals:  01/03/13 1119  BP: 128/78  Pulse: 67  Height: 6' (1.829 m)  Weight: 203 lb (92.08 kg)    GEN- The patient is well appearing, alert and oriented x 3 today.   Head- normocephalic, atraumatic Eyes-  Sclera clear, conjunctiva pink Ears- hearing intact Oropharynx- clear Lungs- Clear to ausculation bilaterally, normal work of breathing Chest- pacemaker pocket is well healed Heart- Regular rate and rhythm (paced) GI- soft, NT, ND, + BS Extremities- no clubbing, cyanosis, or edema  Pacemaker interrogation- reviewed in detail today,  See PACEART report  Assessment and Plan:  1. Afib S/p AV nodal ablation Continue coumadin  2. Complete heart block Normal pacemaker function See Pace Art report No changes today Estimated longevity is <1 month He will return to the device clinic in 1 month Risks, benefits, and alternatives to PPM pulse generator replacement were discussed in detail with the patient and his spouse.  They understand and would like to proceed once he reaches ERI battery status.  I therefore do not need to see him prior to the procedure.  Once he reaches ERI, he should be scheduled to have generator change by me and  I will discuss risks again in short stay.  OK to hold coumadin for 48 hours prior to the procedure.  Return to see Belenda Cruise in 1 month

## 2013-01-03 NOTE — Patient Instructions (Signed)
Your physician recommends that you schedule a follow-up appointment in: 4 weeks with Belenda Cruise  Your physician recommends that you continue on your current medications as directed. Please refer to the Current Medication list given to you today.

## 2013-01-23 ENCOUNTER — Ambulatory Visit (INDEPENDENT_AMBULATORY_CARE_PROVIDER_SITE_OTHER): Payer: Medicare Other | Admitting: *Deleted

## 2013-01-23 ENCOUNTER — Encounter: Payer: Medicare Other | Admitting: Internal Medicine

## 2013-01-23 DIAGNOSIS — I442 Atrioventricular block, complete: Secondary | ICD-10-CM

## 2013-01-23 LAB — MDC_IDC_ENUM_SESS_TYPE_INCLINIC
Implantable Pulse Generator Model: 5130
Implantable Pulse Generator Serial Number: 510526

## 2013-01-23 NOTE — Progress Notes (Signed)
Device reached ERI. Pt to be scheduled for gen change.

## 2013-01-24 ENCOUNTER — Ambulatory Visit: Payer: Medicare Other | Admitting: Cardiology

## 2013-01-24 ENCOUNTER — Telehealth: Payer: Self-pay | Admitting: Internal Medicine

## 2013-01-24 NOTE — Telephone Encounter (Signed)
New message     Had pacemaker checked in [place] office yesterday.  Has battery changout surgery been scheduled?

## 2013-01-29 ENCOUNTER — Other Ambulatory Visit: Payer: Self-pay | Admitting: *Deleted

## 2013-01-29 DIAGNOSIS — I4891 Unspecified atrial fibrillation: Secondary | ICD-10-CM

## 2013-01-29 DIAGNOSIS — Z45018 Encounter for adjustment and management of other part of cardiac pacemaker: Secondary | ICD-10-CM

## 2013-01-29 NOTE — Telephone Encounter (Signed)
Patient scheduled for 02/01/13 at 10am  Will need to be at the hospital at 8am  Patient aware  Labs at [facility] on Thurs His INR yesterday 01/28/13  was 3.6  Held Coumadin yesterday and today and will restart at 5mg  daily and have it rechecked on Thurs.  I have left Rosey Bath Dr Alver Fisher nurse a message with this information and will fax over an order for labs to be drawn this week.

## 2013-01-31 ENCOUNTER — Encounter: Payer: Self-pay | Admitting: Internal Medicine

## 2013-01-31 MED ORDER — CEFAZOLIN SODIUM-DEXTROSE 2-3 GM-% IV SOLR
2.0000 g | INTRAVENOUS | Status: DC
  Filled 2013-01-31 (×2): qty 50

## 2013-01-31 MED ORDER — SODIUM CHLORIDE 0.9 % IR SOLN
80.0000 mg | Status: DC
  Filled 2013-01-31: qty 2

## 2013-02-01 ENCOUNTER — Encounter (HOSPITAL_COMMUNITY): Payer: Self-pay | Admitting: *Deleted

## 2013-02-01 ENCOUNTER — Ambulatory Visit (HOSPITAL_COMMUNITY)
Admission: RE | Admit: 2013-02-01 | Discharge: 2013-02-01 | Disposition: A | Discharge status: Home / Self Care | Payer: Medicare Other | Source: Ambulatory Visit | Attending: Internal Medicine | Admitting: Internal Medicine

## 2013-02-01 ENCOUNTER — Encounter (HOSPITAL_COMMUNITY)
Admission: RE | Disposition: A | Discharge status: Home / Self Care | Payer: Self-pay | Source: Ambulatory Visit | Attending: Internal Medicine

## 2013-02-01 DIAGNOSIS — Z7901 Long term (current) use of anticoagulants: Secondary | ICD-10-CM

## 2013-02-01 DIAGNOSIS — I4891 Unspecified atrial fibrillation: Secondary | ICD-10-CM | POA: Diagnosis not present

## 2013-02-01 DIAGNOSIS — Z950 Presence of cardiac pacemaker: Secondary | ICD-10-CM | POA: Diagnosis present

## 2013-02-01 DIAGNOSIS — K449 Diaphragmatic hernia without obstruction or gangrene: Secondary | ICD-10-CM | POA: Insufficient documentation

## 2013-02-01 DIAGNOSIS — Z79899 Other long term (current) drug therapy: Secondary | ICD-10-CM | POA: Diagnosis not present

## 2013-02-01 DIAGNOSIS — I442 Atrioventricular block, complete: Secondary | ICD-10-CM

## 2013-02-01 DIAGNOSIS — Z9889 Other specified postprocedural states: Secondary | ICD-10-CM

## 2013-02-01 DIAGNOSIS — I4821 Permanent atrial fibrillation: Secondary | ICD-10-CM

## 2013-02-01 DIAGNOSIS — Z45018 Encounter for adjustment and management of other part of cardiac pacemaker: Secondary | ICD-10-CM | POA: Diagnosis not present

## 2013-02-01 DIAGNOSIS — I2510 Atherosclerotic heart disease of native coronary artery without angina pectoris: Secondary | ICD-10-CM | POA: Diagnosis not present

## 2013-02-01 HISTORY — PX: PACEMAKER GENERATOR CHANGE: SHX5481

## 2013-02-01 LAB — GLUCOSE, CAPILLARY
Glucose-Capillary: 147 mg/dL — ABNORMAL HIGH (ref 70–99)
Glucose-Capillary: 154 mg/dL — ABNORMAL HIGH (ref 70–99)

## 2013-02-01 LAB — SURGICAL PCR SCREEN
MRSA, PCR: NEGATIVE
Staphylococcus aureus: NEGATIVE

## 2013-02-01 LAB — PROTIME-INR: INR: 1.4 (ref 0.00–1.49)

## 2013-02-01 SURGERY — PACEMAKER GENERATOR CHANGE
Anesthesia: Moderate Sedation | Laterality: Bilateral

## 2013-02-01 MED ORDER — SODIUM CHLORIDE 0.9 % IJ SOLN: 3.0000 mL | Freq: Two times a day (BID) | INTRAMUSCULAR | Status: DC

## 2013-02-01 MED ORDER — SODIUM CHLORIDE 0.9 % IV SOLN
INTRAVENOUS | Status: DC
  Administered 2013-02-01: 50 mL/h via INTRAVENOUS

## 2013-02-01 MED ORDER — ONDANSETRON HCL 4 MG/2ML IJ SOLN: 4.0000 mg | Freq: Four times a day (QID) | INTRAMUSCULAR | Status: DC | PRN

## 2013-02-01 MED ORDER — CHLORHEXIDINE GLUCONATE 4 % EX LIQD
60.0000 mL | Freq: Once | CUTANEOUS | Status: DC
  Filled 2013-02-01: qty 60

## 2013-02-01 MED ORDER — HYDROCODONE-ACETAMINOPHEN 5-325 MG PO TABS: 1.0000 | ORAL_TABLET | ORAL | Status: DC | PRN

## 2013-02-01 MED ORDER — SODIUM CHLORIDE 0.9 % IV SOLN: 250.0000 mL | INTRAVENOUS | Status: DC | PRN

## 2013-02-01 MED ORDER — MUPIROCIN 2 % EX OINT
TOPICAL_OINTMENT | Freq: Once | CUTANEOUS | Status: AC
  Administered 2013-02-01: 1 via NASAL
  Filled 2013-02-01: qty 22

## 2013-02-01 MED ORDER — MUPIROCIN 2 % EX OINT
TOPICAL_OINTMENT | CUTANEOUS | Status: AC
  Filled 2013-02-01: qty 22

## 2013-02-01 MED ORDER — ACETAMINOPHEN 325 MG PO TABS
325.0000 mg | ORAL_TABLET | ORAL | Status: DC | PRN
  Filled 2013-02-01: qty 2

## 2013-02-01 MED ORDER — SODIUM CHLORIDE 0.9 % IJ SOLN: 3.0000 mL | INTRAMUSCULAR | Status: DC | PRN

## 2013-02-01 MED ORDER — LIDOCAINE HCL (PF) 1 % IJ SOLN
INTRAMUSCULAR | Status: AC
  Filled 2013-02-01: qty 60

## 2013-02-01 NOTE — Interval H&P Note (Signed)
History and Physical Interval Note:  02/01/2013 11:28 AM  Jay Campbell  has presented today for surgery, with the diagnosis of eol  The various methods of treatment have been discussed with the patient and family. After consideration of risks, benefits and other options for treatment, the patient has consented to  Procedure(s): PACEMAKER GENERATOR CHANGE (Bilateral) as a surgical intervention .  The patient's history has been reviewed, patient examined, no change in status, stable for surgery.  I have reviewed the patient's chart and labs.  Questions were answered to the patient's satisfaction.     Hillis Range

## 2013-02-01 NOTE — Op Note (Signed)
SURGEON:  Hillis Range, MD     PREPROCEDURE DIAGNOSES:   1. Permanent Atrial fibrillation.   2. Complete heart block    POSTPROCEDURE DIAGNOSES:   1. Permanent Atrial fibrillation.   2. Complete heart block    PROCEDURES:   1. Pacemaker pulse generator replacement.   2. Skin pocket revision.     INTRODUCTION:  Jay Campbell is a 67 y.o. male with a history of permanent afib with prior AV nodal ablation.  He has longstanding complete heart block. He has done well since his pacemaker was implanted.  He has recently reached ERI battery status.  He presents today for pacemaker pulse generator replacement.       DESCRIPTION OF THE PROCEDURE:  Informed written consent was obtained, and the patient was brought to the electrophysiology lab in the fasting state.  The patient's pacemaker was interrogated today and found to be at elective replacement indicator battery status.  The patient required no sedation for the procedure today.  The patient's left chest was prepped and draped in the usual sterile fashion by the EP lab staff.  The skin overlying the existing pacemaker was infiltrated with lidocaine for local analgesia.  A 4-cm incision was made over the pacemaker pocket.  Using a combination of sharp and blunt dissection, the pacemaker was exposed and removed from the body.  The device was disconnected from the lead. A single silk suture was identified and removed which had secured the device to the pectoralis fascia.  There was no foreign matter or debris within the pocket.  The right ventricular lead was confirmed to be a [employer] model [id number] (serial number [id number]) lead implanted on 04/28/2000.  The lead was examined and its integrity was confirmed to be intact.   Right ventricular lead R-waves were not present today.  The RV lead impedance was 577 ohms with a threshold of 1.25 V at 0.4 msec.  The lead was connected to a [facility] [employer]urity model [id number] (serial number [id number])  pacemaker.  The pocket was revised to accommodate this new device.  Electrocautery was required to assure hemostasis.  The pocket was irrigated with copious gentamicin solution. The pacemaker was then placed into the pocket.  The pocket was then closed in 2 layers with 2-0 Vicryl suture over the subcutaneous and subcuticular layers.  Steri-Strips and a sterile dressing were then applied.  There were no early apparent complications.     CONCLUSIONS:   1. Successful pacemaker pulse generator replacement for elective replacement indicator battery status   2. No early apparent complications.     Hillis Range, MD 02/01/2013 2:59 PM

## 2013-02-01 NOTE — H&P (View-Only) (Signed)
PCP:  SHAH,ASHISH, MD  The patient presents today for routine electrophysiology followup. He is without complaint today. The patient feels that he is tolerating medications without difficulties and is otherwise without complaint today.   Past Medical History  Diagnosis Date  . Coronary artery disease     Mild nonobstructive 1/12  . Atrial fibrillation     Permanent  . S/P AV nodal ablation     s/p SJM PPM  . Warfarin anticoagulation   . Hiatal hernia    Past Surgical History  Procedure Laterality Date  . Pacemaker placement  2002    SJM implanted by Dr Taylor with AV nodal ablation performed    Current Outpatient Prescriptions  Medication Sig Dispense Refill  . allopurinol (ZYLOPRIM) 100 MG tablet Take 1 tablet by mouth Daily.      . ALPRAZolam (XANAX) 0.5 MG tablet Take 0.5 mg by mouth at bedtime.        . cholecalciferol (VITAMIN D) 1000 UNITS tablet Take 1,000 Units by mouth daily.        . COLCRYS 0.6 MG tablet Take 0.6 mg by mouth as needed.       . fish oil-omega-3 fatty acids 1000 MG capsule Take 1 g by mouth 2 (two) times daily.        . glipiZIDE (GLUCOTROL) 2.5 mg TABS tablet Take 2.5 mg by mouth 2 (two) times daily before a meal.      . isosorbide mononitrate (IMDUR) 60 MG 24 hr tablet Take 120 mg by mouth daily.      . metFORMIN (GLUCOPHAGE) 1000 MG tablet Take 1,000 mg by mouth 2 (two) times daily with a meal.        . metoprolol succinate (TOPROL-XL) 25 MG 24 hr tablet Take 0.5 tablets by mouth Daily.      . Multiple Vitamin (MULTIVITAMIN) tablet Take 1 tablet by mouth daily.        . nitroGLYCERIN (NITROSTAT) 0.4 MG SL tablet Place 0.4 mg under the tongue every 5 (five) minutes as needed.        . pantoprazole (PROTONIX) 40 MG tablet Take 1 tablet by mouth daily.      . ramipril (ALTACE) 5 MG capsule Take 5 mg by mouth daily.        . sertraline (ZOLOFT) 50 MG tablet Take 1/2 tab daily      . simvastatin (ZOCOR) 40 MG tablet Take 40 mg by mouth at bedtime.         . sitaGLIPtin (JANUVIA) 50 MG tablet Take 50 mg by mouth daily.      . warfarin (COUMADIN) 5 MG tablet Take 5 mg by mouth as directed.         No current facility-administered medications for this visit.    Allergies  Allergen Reactions  . Shellfish Allergy   . Sulfonamide Derivatives     History   Social History  . Marital Status: Married    Spouse Name: N/A    Number of Children: N/A  . Years of Education: N/A   Occupational History  . Not on file.   Social History Main Topics  . Smoking status: Never Smoker   . Smokeless tobacco: Never Used  . Alcohol Use: No  . Drug Use: No  . Sexual Activity: Not on file   Other Topics Concern  . Not on file   Social History Narrative  . No narrative on file     Physical Exam: Filed Vitals:     01/03/13 1119  BP: 128/78  Pulse: 67  Height: 6' (1.829 m)  Weight: 203 lb (92.08 kg)    GEN- The patient is well appearing, alert and oriented x 3 today.   Head- normocephalic, atraumatic Eyes-  Sclera clear, conjunctiva pink Ears- hearing intact Oropharynx- clear Lungs- Clear to ausculation bilaterally, normal work of breathing Chest- pacemaker pocket is well healed Heart- Regular rate and rhythm (paced) GI- soft, NT, ND, + BS Extremities- no clubbing, cyanosis, or edema  Pacemaker interrogation- reviewed in detail today,  See PACEART report  Assessment and Plan:  1. Afib S/p AV nodal ablation Continue coumadin  2. Complete heart block Normal pacemaker function See Pace Art report No changes today Estimated longevity is <1 month He will return to the device clinic in 1 month Risks, benefits, and alternatives to PPM pulse generator replacement were discussed in detail with the patient and his spouse.  They understand and would like to proceed once he reaches ERI battery status.  I therefore do not need to see him prior to the procedure.  Once he reaches ERI, he should be scheduled to have generator change by me and  I will discuss risks again in short stay.  OK to hold coumadin for 48 hours prior to the procedure.  Return to see Kristin in 1 month   

## 2013-02-13 ENCOUNTER — Encounter: Payer: Self-pay | Admitting: Internal Medicine

## 2013-02-13 ENCOUNTER — Ambulatory Visit (INDEPENDENT_AMBULATORY_CARE_PROVIDER_SITE_OTHER): Payer: Medicare Other | Admitting: Internal Medicine

## 2013-02-13 VITALS — BP 138/72 | HR 60 | Ht 72.0 in | Wt 201.4 lb

## 2013-02-13 DIAGNOSIS — Z9889 Other specified postprocedural states: Secondary | ICD-10-CM

## 2013-02-13 DIAGNOSIS — I4821 Permanent atrial fibrillation: Secondary | ICD-10-CM

## 2013-02-13 DIAGNOSIS — I442 Atrioventricular block, complete: Secondary | ICD-10-CM

## 2013-02-13 DIAGNOSIS — I4891 Unspecified atrial fibrillation: Secondary | ICD-10-CM

## 2013-02-13 DIAGNOSIS — Z950 Presence of cardiac pacemaker: Secondary | ICD-10-CM

## 2013-02-13 NOTE — Patient Instructions (Signed)
Your physician wants you to follow-up in: 12 months with Dr Johney Frame in [place] You will receive a reminder letter in the mail two months in advance. If you don't receive a letter, please call our office to schedule the follow-up appointment.  Remote monitoring is used to monitor your Pacemaker or ICD from home. This monitoring reduces the number of office visits required to check your device to one time per year. It allows [place] to keep an eye on the functioning of your device to ensure it is working properly. You are scheduled for a device check from home on 05/16/13. You may send your transmission at any time that day. If you have a wireless device, the transmission will be sent automatically. After your physician reviews your transmission, you will receive a postcard with your next transmission date.

## 2013-02-13 NOTE — Progress Notes (Signed)
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  Since his recent generator change, the patient reports doing very well.  Today, he denies symptoms of palpitations, chest pain, shortness of breath,  lower extremity edema, dizziness, presyncope, or syncope.  The patient is otherwise without complaint today.   Past Medical History  Diagnosis Date  . Coronary artery disease     Mild nonobstructive 1/12  . Atrial fibrillation     Permanent  . S/P AV nodal ablation     s/p SJM PPM; gen change 02-01-13 by [name]  . Warfarin anticoagulation   . Hiatal hernia    Past Surgical History  Procedure Laterality Date  . Pacemaker placement  2002; 2014    SJM implanted by [name] with AV nodal ablation performed; gen change to [employer] SR RF by [name] 02-01-13    Current Outpatient Prescriptions  Medication Sig Dispense Refill  . allopurinol (ZYLOPRIM) 100 MG tablet Take 100 mg by mouth Daily.       Marland Kitchen ALPRAZolam (XANAX) 0.5 MG tablet Take 0.5 mg by mouth at bedtime.        Marland Kitchen COLCRYS 0.6 MG tablet Take 0.6 mg by mouth as needed.       . fish oil-omega-3 fatty acids 1000 MG capsule Take 1 g by mouth 2 (two) times daily.        Marland Kitchen glipiZIDE (GLUCOTROL) 2.5 mg TABS tablet Take 2.5 mg by mouth 2 (two) times daily before a meal.      . isosorbide mononitrate (IMDUR) 60 MG 24 hr tablet Take 120 mg by mouth daily.      . metFORMIN (GLUCOPHAGE) 1000 MG tablet Take 1,000 mg by mouth 2 (two) times daily with a meal.        . metoprolol succinate (TOPROL-XL) 25 MG 24 hr tablet Take 0.5 tablets by mouth Daily.      . Multiple Vitamin (MULTIVITAMIN) tablet Take 1 tablet by mouth daily.        . nitroGLYCERIN (NITROSTAT) 0.4 MG SL tablet Place 0.4 mg under the tongue every 5 (five) minutes as needed.        . pantoprazole (PROTONIX) 40 MG tablet Take 40 mg by mouth daily.       . ramipril (ALTACE) 5 MG  capsule Take 5 mg by mouth daily.        . sertraline (ZOLOFT) 50 MG tablet Take 1/2 tab daily      . simvastatin (ZOCOR) 40 MG tablet Take 40 mg by mouth at bedtime.        . sitaGLIPtin (JANUVIA) 100 MG tablet Take 100 mg by mouth daily.      Marland Kitchen warfarin (COUMADIN) 5 MG tablet Take 5-7.5 mg by mouth daily. 7.5 mg on Monday, Wednesday, and Friday and 5 mg all other days       No current facility-administered medications for this visit.    Physical Exam: Filed Vitals:   02/13/13 1001  BP: 138/72  Pulse: 60  Height: 6' (1.829 m)  Weight: 201 lb 6.4 oz (91.354 kg)    GEN- The patient is well appearing, alert and oriented x 3 today.   Chest- pacemaker pocket is well healed  Pacemaker interrogation- reviewed in detail today,  See PACEART report  Assessment and Plan:  1. Complete heart block Normal pacemaker function See Pace Art report No changes today  2. Permanent  afib Continue long term anticoagluation  Merlin Return to see me in [place] in 1 year

## 2013-02-25 ENCOUNTER — Encounter: Payer: Medicare Other | Admitting: Internal Medicine

## 2013-03-12 LAB — MDC_IDC_ENUM_SESS_TYPE_INCLINIC
Battery Remaining Longevity: 136.8 mo
Battery Voltage: 3.07 V
Brady Statistic RV Percent Paced: 99.99 %
Lead Channel Impedance Value: 600 Ohm
Lead Channel Pacing Threshold Amplitude: 1.125 V
Lead Channel Pacing Threshold Pulse Width: 0.4 ms
Lead Channel Setting Pacing Amplitude: 1.375
Lead Channel Setting Pacing Pulse Width: 0.4 ms

## 2013-05-02 ENCOUNTER — Ambulatory Visit (INDEPENDENT_AMBULATORY_CARE_PROVIDER_SITE_OTHER): Payer: Medicare Other | Admitting: Cardiology

## 2013-05-02 ENCOUNTER — Encounter: Payer: Self-pay | Admitting: Cardiology

## 2013-05-02 VITALS — BP 133/78 | HR 63 | Ht 72.0 in | Wt 203.0 lb

## 2013-05-02 DIAGNOSIS — Z950 Presence of cardiac pacemaker: Secondary | ICD-10-CM

## 2013-05-02 DIAGNOSIS — I4821 Permanent atrial fibrillation: Secondary | ICD-10-CM

## 2013-05-02 DIAGNOSIS — I2510 Atherosclerotic heart disease of native coronary artery without angina pectoris: Secondary | ICD-10-CM

## 2013-05-02 DIAGNOSIS — I4891 Unspecified atrial fibrillation: Secondary | ICD-10-CM

## 2013-05-02 NOTE — Progress Notes (Signed)
Clinical Summary Jay Campbell is a 68 y.o.male last seen in January 2014. More recent interval followup with Dr. Rayann Heman noted in December 2014 at which time pacemaker function was normal. He is here with his wife today. Reports no palpitations, no dizziness or syncope. Denies any major bleeding problems on Coumadin - followed by Dr. Manuella Ghazi.  Stays active around the house, also does some outdoor chores. He has had no cardiac hospitalizations. Reports compliance with his medications.   Allergies  Allergen Reactions  . Shellfish Allergy Itching and Rash  . Sulfonamide Derivatives Itching and Rash    Current Outpatient Prescriptions  Medication Sig Dispense Refill  . allopurinol (ZYLOPRIM) 100 MG tablet Take 100 mg by mouth Daily.       Marland Kitchen ALPRAZolam (XANAX) 0.5 MG tablet Take 0.5 mg by mouth at bedtime.        Marland Kitchen COLCRYS 0.6 MG tablet Take 0.6 mg by mouth as needed.       . fish oil-omega-3 fatty acids 1000 MG capsule Take 1 g by mouth 2 (two) times daily.        Marland Kitchen glipiZIDE (GLUCOTROL) 2.5 mg TABS tablet Take 2.5 mg by mouth 2 (two) times daily before a meal.      . isosorbide mononitrate (IMDUR) 60 MG 24 hr tablet Take 120 mg by mouth daily.      . metFORMIN (GLUCOPHAGE) 1000 MG tablet Take 1,000 mg by mouth 2 (two) times daily with a meal.        . metoprolol succinate (TOPROL-XL) 25 MG 24 hr tablet Take 0.5 tablets by mouth Daily.      . Multiple Vitamin (MULTIVITAMIN) tablet Take 1 tablet by mouth daily.        . nitroGLYCERIN (NITROSTAT) 0.4 MG SL tablet Place 0.4 mg under the tongue every 5 (five) minutes as needed.        . pantoprazole (PROTONIX) 40 MG tablet Take 40 mg by mouth daily.       . ramipril (ALTACE) 5 MG capsule Take 5 mg by mouth daily.        . sertraline (ZOLOFT) 50 MG tablet Take 1/2 tab daily      . simvastatin (ZOCOR) 40 MG tablet Take 40 mg by mouth at bedtime.        . sitaGLIPtin (JANUVIA) 100 MG tablet Take 100 mg by mouth daily.      Marland Kitchen warfarin (COUMADIN) 5  MG tablet Take 5-7.5 mg by mouth daily. 7.5 mg on Monday, Wednesday, and Friday and 5 mg all other days       No current facility-administered medications for this visit.    Past Medical History  Diagnosis Date  . Coronary artery disease     Mild nonobstructive 1/12  . Atrial fibrillation     Permanent  . S/P AV nodal ablation     s/p SJM PPM; gen change 02-01-13 by Dr Rayann Heman  . Warfarin anticoagulation   . Hiatal hernia     Past Surgical History  Procedure Laterality Date  . Pacemaker placement  2002; 2014    SJM implanted by Dr Lovena Le with AV nodal ablation performed; gen change to [employer] SR RF by Dr Rayann Heman 02-01-13    Social History Jay Campbell reports that he has never smoked. He has never used smokeless tobacco. Jay Campbell reports that he does not drink alcohol.  Review of Systems Negative except as outlined.  Physical Examination Filed Vitals:   05/02/13 1026  BP:  133/78  Pulse: 63   Filed Weights   05/02/13 1026  Weight: 203 lb (92.08 kg)    No acute distress.  HEENT: Conjunctiva and lids normal, oropharynx clear.  Neck: Supple, no elevated JVP or carotid bruits, no thyromegaly.  Lungs: Clear to auscultation, nonlabored breathing at rest.  Cardiac: Regular rate and rhythm, no S3 or significant systolic murmur, no pericardial rub.  Abdomen: Soft, bowel sounds present.  Extremities: No pitting edema, distal pulses 2+.   Problem List and Plan   Permanent atrial fibrillation Status post AV nodal ablation and pacemaker placement. Continue current medical regimen including anticoagulation that is followed by Dr. Manuella Ghazi.  Cardiac pacemaker in situ [employer] Jude device, followed by Dr. Rayann Heman.  CORONARY ATHEROSCLEROSIS NATIVE CORONARY ARTERY History of nonobstructive disease, no active angina symptoms.    Satira Sark, M.D., F.A.C.C.

## 2013-05-02 NOTE — Assessment & Plan Note (Signed)
History of nonobstructive disease, no active angina symptoms. 

## 2013-05-02 NOTE — Assessment & Plan Note (Signed)
Status post AV nodal ablation and pacemaker placement. Continue current medical regimen including anticoagulation that is followed by Dr. Shah. 

## 2013-05-02 NOTE — Patient Instructions (Signed)

## 2013-05-02 NOTE — Assessment & Plan Note (Signed)
[employer] Jude device, followed by Dr. Rayann Heman.

## 2013-05-16 ENCOUNTER — Ambulatory Visit (INDEPENDENT_AMBULATORY_CARE_PROVIDER_SITE_OTHER): Payer: Medicare Other | Admitting: *Deleted

## 2013-05-16 DIAGNOSIS — Z950 Presence of cardiac pacemaker: Secondary | ICD-10-CM

## 2013-05-16 DIAGNOSIS — I442 Atrioventricular block, complete: Secondary | ICD-10-CM

## 2013-06-14 LAB — MDC_IDC_ENUM_SESS_TYPE_REMOTE
Battery Remaining Longevity: 139 mo
Brady Statistic RV Percent Paced: 99 %
Date Time Interrogation Session: 20150305070018
Implantable Pulse Generator Model: 1240
Lead Channel Pacing Threshold Pulse Width: 0.4 ms
Lead Channel Setting Pacing Amplitude: 1.5 V
Lead Channel Setting Sensing Sensitivity: 6 mV
MDC IDC MSMT BATTERY VOLTAGE: 3.02 V
MDC IDC MSMT LEADCHNL RV IMPEDANCE VALUE: 600 Ohm
MDC IDC MSMT LEADCHNL RV PACING THRESHOLD AMPLITUDE: 1.25 V
MDC IDC PG SERIAL: 7488472
MDC IDC SET LEADCHNL RV PACING PULSEWIDTH: 0.4 ms

## 2013-06-21 ENCOUNTER — Encounter: Payer: Self-pay | Admitting: *Deleted

## 2013-07-01 ENCOUNTER — Encounter: Payer: Self-pay | Admitting: Internal Medicine

## 2013-08-19 ENCOUNTER — Ambulatory Visit (INDEPENDENT_AMBULATORY_CARE_PROVIDER_SITE_OTHER): Payer: Medicare Other | Admitting: *Deleted

## 2013-08-19 DIAGNOSIS — I4821 Permanent atrial fibrillation: Secondary | ICD-10-CM

## 2013-08-19 DIAGNOSIS — I4891 Unspecified atrial fibrillation: Secondary | ICD-10-CM

## 2013-08-19 NOTE — Progress Notes (Signed)
Remote pacemaker transmission.   

## 2013-08-23 LAB — MDC_IDC_ENUM_SESS_TYPE_REMOTE
Battery Voltage: 3.02 V
Implantable Pulse Generator Model: 1240
Implantable Pulse Generator Serial Number: 7488472
Lead Channel Setting Pacing Amplitude: 1.5 V
Lead Channel Setting Pacing Pulse Width: 0.4 ms
Lead Channel Setting Sensing Sensitivity: 6 mV
MDC IDC STAT BRADY RV PERCENT PACED: 99 % — AB

## 2013-09-10 ENCOUNTER — Encounter: Payer: Self-pay | Admitting: Cardiology

## 2013-11-12 ENCOUNTER — Encounter: Payer: Self-pay | Admitting: Internal Medicine

## 2013-11-20 ENCOUNTER — Ambulatory Visit (INDEPENDENT_AMBULATORY_CARE_PROVIDER_SITE_OTHER): Payer: Medicare Other | Admitting: *Deleted

## 2013-11-20 DIAGNOSIS — I4891 Unspecified atrial fibrillation: Secondary | ICD-10-CM

## 2013-11-20 DIAGNOSIS — I4821 Permanent atrial fibrillation: Secondary | ICD-10-CM

## 2013-11-20 NOTE — Progress Notes (Signed)
Remote pacemaker transmission.   

## 2013-11-23 LAB — MDC_IDC_ENUM_SESS_TYPE_REMOTE
Battery Remaining Longevity: 136 mo
Battery Remaining Percentage: 95.5 %
Battery Voltage: 3.02 V
Date Time Interrogation Session: 20150909065618
Implantable Pulse Generator Serial Number: 7488472
Lead Channel Pacing Threshold Pulse Width: 0.4 ms
Lead Channel Setting Sensing Sensitivity: 6 mV
MDC IDC MSMT LEADCHNL RV IMPEDANCE VALUE: 600 Ohm
MDC IDC MSMT LEADCHNL RV PACING THRESHOLD AMPLITUDE: 1.125 V
MDC IDC SET LEADCHNL RV PACING AMPLITUDE: 1.375
MDC IDC SET LEADCHNL RV PACING PULSEWIDTH: 0.4 ms
MDC IDC STAT BRADY RV PERCENT PACED: 99 %

## 2013-11-25 ENCOUNTER — Encounter: Payer: Self-pay | Admitting: Cardiology

## 2013-11-26 ENCOUNTER — Encounter: Payer: Self-pay | Admitting: Internal Medicine

## 2014-02-20 ENCOUNTER — Encounter (HOSPITAL_COMMUNITY): Payer: Self-pay | Admitting: Internal Medicine

## 2014-02-21 ENCOUNTER — Encounter: Payer: Self-pay | Admitting: Internal Medicine

## 2014-02-21 ENCOUNTER — Ambulatory Visit (INDEPENDENT_AMBULATORY_CARE_PROVIDER_SITE_OTHER): Payer: Medicare Other | Admitting: Internal Medicine

## 2014-02-21 VITALS — BP 119/68 | HR 60 | Ht 72.0 in | Wt 198.8 lb

## 2014-02-21 DIAGNOSIS — I4821 Permanent atrial fibrillation: Secondary | ICD-10-CM

## 2014-02-21 DIAGNOSIS — I442 Atrioventricular block, complete: Secondary | ICD-10-CM

## 2014-02-21 DIAGNOSIS — I482 Chronic atrial fibrillation: Secondary | ICD-10-CM

## 2014-02-21 LAB — MDC_IDC_ENUM_SESS_TYPE_INCLINIC
Implantable Pulse Generator Serial Number: 7488472
Lead Channel Impedance Value: 600 Ohm
Lead Channel Setting Pacing Amplitude: 1.25 V
Lead Channel Setting Pacing Pulse Width: 0.4 ms
Lead Channel Setting Sensing Sensitivity: 6 mV
MDC IDC MSMT BATTERY REMAINING LONGEVITY: 127.2 mo
MDC IDC MSMT BATTERY VOLTAGE: 3.02 V
MDC IDC MSMT LEADCHNL RV PACING THRESHOLD AMPLITUDE: 1 V
MDC IDC MSMT LEADCHNL RV PACING THRESHOLD PULSEWIDTH: 0.4 ms
MDC IDC SESS DTM: 20151211121542
MDC IDC STAT BRADY RV PERCENT PACED: 99.99 %

## 2014-02-21 MED ORDER — NITROGLYCERIN 0.4 MG SL SUBL: 0.4000 mg | SUBLINGUAL_TABLET | SUBLINGUAL | Status: DC | PRN

## 2014-02-21 NOTE — Progress Notes (Signed)
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  Since his last visit, the patient reports doing very well.  Today, he denies symptoms of palpitations, chest pain, shortness of breath,  lower extremity edema, dizziness, presyncope, or syncope.  The patient is otherwise without complaint today.   Past Medical History  Diagnosis Date  . Coronary artery disease     Mild nonobstructive 1/12  . Atrial fibrillation     Permanent  . S/P AV nodal ablation     s/p SJM PPM; gen change 02-01-13 by Dr Rayann Heman  . Warfarin anticoagulation   . Hiatal hernia    Past Surgical History  Procedure Laterality Date  . Pacemaker placement  2002; 2014    SJM implanted by Dr Lovena Le with AV nodal ablation performed; gen change to [employer] SR RF by Dr Rayann Heman 02-01-13  . Pacemaker generator change Bilateral 02/01/2013    Procedure: PACEMAKER GENERATOR CHANGE;  Surgeon: Coralyn Mark, MD;  Location: [facility] CATH LAB;  Service: Cardiovascular;  Laterality: Bilateral;    Current Outpatient Prescriptions  Medication Sig Dispense Refill  . allopurinol (ZYLOPRIM) 100 MG tablet Take 100 mg by mouth Daily.     Marland Kitchen ALPRAZolam (XANAX) 0.5 MG tablet Take 0.5 mg by mouth at bedtime.      Marland Kitchen COLCRYS 0.6 MG tablet Take 0.6 mg by mouth as needed.     . fish oil-omega-3 fatty acids 1000 MG capsule Take 1 g by mouth 2 (two) times daily.      . insulin aspart protamine- aspart (NOVOLOG MIX 70/30) (70-30) 100 UNIT/ML injection Inject 30 Units into the skin 2 (two) times daily.    . isosorbide mononitrate (IMDUR) 60 MG 24 hr tablet Take 120 mg by mouth daily.    . metFORMIN (GLUCOPHAGE) 1000 MG tablet Take 1,000 mg by mouth 2 (two) times daily with a meal.      . metoprolol succinate (TOPROL-XL) 25 MG 24 hr tablet Take 0.5 tablets by mouth Daily.    . Multiple Vitamin (MULTIVITAMIN) tablet Take 1 tablet by mouth daily.      .  nitroGLYCERIN (NITROSTAT) 0.4 MG SL tablet Place 1 tablet (0.4 mg total) under the tongue every 5 (five) minutes x 3 doses as needed. 25 tablet 3  . pantoprazole (PROTONIX) 40 MG tablet Take 40 mg by mouth daily.     . ramipril (ALTACE) 5 MG capsule Take 5 mg by mouth daily.      . sertraline (ZOLOFT) 50 MG tablet Take 1/2 tab daily    . simvastatin (ZOCOR) 40 MG tablet Take 40 mg by mouth at bedtime.      Marland Kitchen warfarin (COUMADIN) 5 MG tablet Take 5-7.5 mg by mouth daily. 7.5 mg on Monday, Wednesday, and Friday and 5 mg all other days (MANAGED BY PMD)     No current facility-administered medications for this visit.    Physical Exam: Filed Vitals:   02/21/14 1147  BP: 119/68  Pulse: 60  Height: 6' (1.829 m)  Weight: 198 lb 12.8 oz (90.175 kg)  SpO2: 98%    GEN- The patient is well appearing, alert and oriented x 3 today.   Chest- pacemaker pocket is well healed  Pacemaker interrogation- reviewed in detail today,  See PACEART report  Assessment and Plan:  1. Complete heart block Normal pacemaker function See Pace Art report No changes today  2. Permanent afib Continue long  term anticoagluation  Merlin Return to see me in [place] in 1 year

## 2014-02-21 NOTE — Patient Instructions (Signed)
Your physician recommends that you schedule a follow-up appointment in: 1 year with Dr. Rayann Heman. You will receive a reminder letter in the mail in about 10 months reminding you to call and schedule your appointment. If you don't receive this letter, please contact our office. Merlin device check 05/22/14. Your physician recommends that you continue on your current medications as directed. Please refer to the Current Medication list given to you today.

## 2014-03-16 ENCOUNTER — Encounter: Payer: Self-pay | Admitting: Cardiology

## 2014-03-20 ENCOUNTER — Ambulatory Visit (INDEPENDENT_AMBULATORY_CARE_PROVIDER_SITE_OTHER): Payer: Medicare Other | Admitting: Cardiology

## 2014-03-20 ENCOUNTER — Encounter: Payer: Self-pay | Admitting: Cardiology

## 2014-03-20 ENCOUNTER — Encounter: Payer: Self-pay | Admitting: *Deleted

## 2014-03-20 VITALS — BP 136/72 | HR 60 | Ht 72.0 in | Wt 199.0 lb

## 2014-03-20 DIAGNOSIS — I482 Chronic atrial fibrillation: Secondary | ICD-10-CM

## 2014-03-20 DIAGNOSIS — I2510 Atherosclerotic heart disease of native coronary artery without angina pectoris: Secondary | ICD-10-CM

## 2014-03-20 DIAGNOSIS — R072 Precordial pain: Secondary | ICD-10-CM | POA: Insufficient documentation

## 2014-03-20 DIAGNOSIS — I4821 Permanent atrial fibrillation: Secondary | ICD-10-CM

## 2014-03-20 NOTE — Assessment & Plan Note (Signed)
Mild by cardiac catheterization in 2012.

## 2014-03-20 NOTE — Assessment & Plan Note (Signed)
Symptoms sound very much like reflux/heartburn, although are fairly recent in onset despite use of PPI, and he does have a history of previously documented mild CAD as of 2012. Recent cardiac markers were negative during hospital observation. ECG shows ventricular pacing with underlying atrial fibrillation. Plan is to proceed with a follow-up Lexiscan Cardiolite on medical therapy. If low risk, may need to consider evaluation by GI specialist for endoscopy.

## 2014-03-20 NOTE — Patient Instructions (Signed)

## 2014-03-20 NOTE — Progress Notes (Signed)
Reason for visit: Hospital follow-up  Clinical Summary [name] is a 69 y.o.male last seen by me in February 2015. More recent interval visit with Dr. Rayann Heman noted in December 2015 at which time the patient was clinically stable with normal pacemaker function.  Records indicate recent admission to [facility], discharged on January 4. There is no discharge summary. He was admitted with chest pain/heartburn by Dr. Manuella Ghazi. Lab work showed BUN 14, creatinine 1.0, troponin I negative 3, INR 1.9, hemoglobin 13.2, platelets 133. ECG showed a ventricular paced rhythm with underlying atrial fibrillation.  He is here with his wife today. He states that he experienced a "burning" discomfort in his central chest area up into the neck, describes it as reflux, has been consistent with Protonix however. Prior to this he had done some outdoor work including cutting down some trees. Had some associated dizziness with his symptoms, but no clear reproducible exertional component. He states in retrospect that coffee, tea, other spicy foods have seemed to exacerbate the symptoms. He has no known history of esophageal erosions or PUD.  He states that Mylanta was helpful during his hospitalization. He and his wife are still concerned about the status of his heart, he has not had follow-up stress testing since 2011. He had documentation of mild nonobstructive CAD at cardiac catheterization in 2012.   Allergies  Allergen Reactions  . Shellfish Allergy Itching and Rash  . Sulfonamide Derivatives Itching and Rash    Current Outpatient Prescriptions  Medication Sig Dispense Refill  . allopurinol (ZYLOPRIM) 100 MG tablet Take 100 mg by mouth Daily.     . clonazePAM (KLONOPIN) 0.5 MG tablet Take 0.5 mg by mouth daily.     Marland Kitchen COLCRYS 0.6 MG tablet Take 0.6 mg by mouth as needed.     . etodolac (LODINE) 400 MG tablet Take 400 mg by mouth 3 (three) times daily.    . fish oil-omega-3 fatty acids 1000 MG capsule Take 1 g by  mouth 2 (two) times daily.      . insulin aspart protamine- aspart (NOVOLOG MIX 70/30) (70-30) 100 UNIT/ML injection Inject 30 Units into the skin 2 (two) times daily.    . isosorbide mononitrate (IMDUR) 60 MG 24 hr tablet Take 120 mg by mouth daily.    . metFORMIN (GLUCOPHAGE) 1000 MG tablet Take 1,000 mg by mouth 2 (two) times daily with a meal.      . metoprolol succinate (TOPROL-XL) 25 MG 24 hr tablet Take 0.5 tablets by mouth Daily.    . Multiple Vitamin (MULTIVITAMIN) tablet Take 1 tablet by mouth daily.      . nitroGLYCERIN (NITROSTAT) 0.4 MG SL tablet Place 1 tablet (0.4 mg total) under the tongue every 5 (five) minutes x 3 doses as needed. 25 tablet 3  . pantoprazole (PROTONIX) 40 MG tablet Take 40 mg by mouth daily.     . ramipril (ALTACE) 5 MG capsule Take 5 mg by mouth daily.      . sertraline (ZOLOFT) 50 MG tablet Take 1/2 tab daily    . simvastatin (ZOCOR) 40 MG tablet Take 40 mg by mouth at bedtime.      Marland Kitchen warfarin (COUMADIN) 5 MG tablet Take 5-7.5 mg by mouth daily. 7.5 mg on Monday, Wednesday, and Friday and 5 mg all other days (MANAGED BY PMD)     No current facility-administered medications for this visit.    Past Medical History  Diagnosis Date  . Coronary artery disease  Mild nonobstructive 1/12  . Atrial fibrillation     Permanent  . S/P AV nodal ablation     s/p SJM PPM; gen change 02-01-13 by Dr Rayann Heman  . Warfarin anticoagulation   . Hiatal hernia     Past Surgical History  Procedure Laterality Date  . Pacemaker placement  2002; 2014    SJM implanted by Dr Lovena Le with AV nodal ablation performed; gen change to [employer] SR RF by Dr Rayann Heman 02-01-13  . Pacemaker generator change Bilateral 02/01/2013    Procedure: PACEMAKER GENERATOR CHANGE;  Surgeon: Coralyn Mark, MD;  Location: [facility] CATH LAB;  Service: Cardiovascular;  Laterality: Bilateral;    Social History Mr. Vasil reports that he has never smoked. He has never used smokeless tobacco. Mr. Panas  reports that he does not drink alcohol.  Review of Systems Complete review of systems negative except as otherwise outlined in the clinical summary and also the following. No palpitations, no syncope. No orthopnea or PND. No obvious melena or hematochezia.  Physical Examination Filed Vitals:   03/20/14 1142  BP: 136/72  Pulse: 60   Filed Weights   03/20/14 1142  Weight: 199 lb (90.266 kg)    No acute distress.  HEENT: Conjunctiva and lids normal, oropharynx clear.  Neck: Supple, no elevated JVP or carotid bruits, no thyromegaly.  Lungs: Clear to auscultation, nonlabored breathing at rest.  Cardiac: Regular rate and rhythm, no S3 or significant systolic murmur, no pericardial rub.  Abdomen: Soft, bowel sounds present.  Extremities: No pitting edema, distal pulses 2+. Skin: Warm and dry. Musculoskeletal: No kyphosis. Neuropsychiatric: Alert and oriented 3, affect appropriate.   Problem List and Plan   Precordial pain Symptoms sound very much like reflux/heartburn, although are fairly recent in onset despite use of PPI, and he does have a history of previously documented mild CAD as of 2012. Recent cardiac markers were negative during hospital observation. ECG shows ventricular pacing with underlying atrial fibrillation. Plan is to proceed with a follow-up Lexiscan Cardiolite on medical therapy. If low risk, may need to consider evaluation by GI specialist for endoscopy.  Permanent atrial fibrillation Status post AV nodal ablation and pacemaker placement. Continue current medical regimen including anticoagulation that is followed by Dr. Manuella Ghazi.  CORONARY ATHEROSCLEROSIS NATIVE CORONARY ARTERY Mild by cardiac catheterization in 2012.    Satira Sark, M.D., F.A.C.C.

## 2014-03-20 NOTE — Assessment & Plan Note (Signed)
Status post AV nodal ablation and pacemaker placement. Continue current medical regimen including anticoagulation that is followed by Dr. Manuella Ghazi.

## 2014-03-26 ENCOUNTER — Encounter (HOSPITAL_COMMUNITY): Payer: Self-pay

## 2014-03-26 ENCOUNTER — Encounter (HOSPITAL_COMMUNITY)
Admission: RE | Admit: 2014-03-26 | Discharge: 2014-03-26 | Disposition: A | Discharge status: Home / Self Care | Payer: Medicare Other | Source: Ambulatory Visit | Attending: Cardiology | Admitting: Cardiology

## 2014-03-26 ENCOUNTER — Ambulatory Visit (HOSPITAL_COMMUNITY)
Admission: RE | Admit: 2014-03-26 | Discharge: 2014-03-26 | Disposition: A | Discharge status: Home / Self Care | Payer: Medicare Other | Source: Ambulatory Visit | Attending: Cardiology | Admitting: Cardiology

## 2014-03-26 DIAGNOSIS — I2510 Atherosclerotic heart disease of native coronary artery without angina pectoris: Secondary | ICD-10-CM | POA: Diagnosis not present

## 2014-03-26 DIAGNOSIS — R072 Precordial pain: Secondary | ICD-10-CM

## 2014-03-26 DIAGNOSIS — R079 Chest pain, unspecified: Secondary | ICD-10-CM | POA: Insufficient documentation

## 2014-03-26 DIAGNOSIS — Z950 Presence of cardiac pacemaker: Secondary | ICD-10-CM | POA: Insufficient documentation

## 2014-03-26 MED ORDER — SODIUM CHLORIDE 0.9 % IJ SOLN
INTRAMUSCULAR | Status: AC
  Administered 2014-03-26: 10:00:00 10 mL via INTRAVENOUS
  Filled 2014-03-26: qty 3

## 2014-03-26 MED ORDER — TECHNETIUM TC 99M SESTAMIBI - CARDIOLITE
30.0000 | Freq: Once | INTRAVENOUS | Status: AC | PRN
  Administered 2014-03-26: 30 via INTRAVENOUS

## 2014-03-26 MED ORDER — REGADENOSON 0.4 MG/5ML IV SOLN
INTRAVENOUS | Status: AC
  Administered 2014-03-26: 10:00:00 0.4 mg via INTRAVENOUS
  Filled 2014-03-26: qty 5

## 2014-03-26 MED ORDER — TECHNETIUM TC 99M SESTAMIBI GENERIC - CARDIOLITE
10.0000 | Freq: Once | INTRAVENOUS | Status: AC | PRN
  Administered 2014-03-26: 10 via INTRAVENOUS

## 2014-03-26 MED ORDER — REGADENOSON 0.4 MG/5ML IV SOLN
0.4000 mg | Freq: Once | INTRAVENOUS | Status: AC | PRN
  Administered 2014-03-26: 0.4 mg via INTRAVENOUS

## 2014-03-26 MED ORDER — SODIUM CHLORIDE 0.9 % IJ SOLN
10.0000 mL | INTRAMUSCULAR | Status: DC | PRN
  Administered 2014-03-26: 10 mL via INTRAVENOUS
  Filled 2014-03-26: qty 10

## 2014-03-26 NOTE — Progress Notes (Signed)
Stress Lab Nurses Notes - Jay Campbell  Jay Campbell 03/26/2014 Reason for doing test: CAD, Chest Pain and up into neck. Type of test: Wille Glaser Nurse performing test: Jay Halls, RN Nuclear Medicine Tech: Jay Campbell Echo Tech: Not Applicable MD performing test: Jay Campbell Public PA Family MD: Jay Campbell Test explained and consent signed: Yes.   IV started: Saline lock flushed, No redness or edema and Saline lock started in radiology Symptoms: pressure in chest Treatment/Intervention: None Reason test stopped: protocol completed After recovery IV was: Discontinued via X-ray tech and No redness or edema Patient to return to Nuc. Med at :11:00 Patient discharged: Home Patient's Condition upon discharge was: stable Comments: During test BP 125/58 & HR 63.  Recovery BP 124/58 & HR 62.. Symptoms resolved in recovery.  Jay Campbell

## 2014-03-27 ENCOUNTER — Telehealth: Payer: Self-pay | Admitting: *Deleted

## 2014-03-27 NOTE — Telephone Encounter (Signed)
-----   Message from Satira Sark, MD sent at 03/27/2014  8:08 AM EST ----- Reviewed report. Please let him know that the stress test looked good, low risk without evidence of ischemia, normal LVEF 64%. With his recent chest discomfort symptoms, reflux has been considered. He may want to talk with his PCP about getting GI referral if his symptoms persist.

## 2014-03-27 NOTE — Telephone Encounter (Signed)
Patient's wife informed

## 2014-04-29 ENCOUNTER — Ambulatory Visit: Payer: Medicare Other | Admitting: Physician Assistant

## 2014-04-29 ENCOUNTER — Encounter: Payer: Self-pay | Admitting: Physician Assistant

## 2014-04-29 ENCOUNTER — Ambulatory Visit (INDEPENDENT_AMBULATORY_CARE_PROVIDER_SITE_OTHER): Payer: Medicare Other | Admitting: Physician Assistant

## 2014-04-29 ENCOUNTER — Telehealth: Payer: Self-pay | Admitting: *Deleted

## 2014-04-29 ENCOUNTER — Other Ambulatory Visit (INDEPENDENT_AMBULATORY_CARE_PROVIDER_SITE_OTHER): Payer: Medicare Other

## 2014-04-29 VITALS — BP 152/76 | HR 60 | Ht 72.0 in | Wt 206.4 lb

## 2014-04-29 DIAGNOSIS — I4891 Unspecified atrial fibrillation: Secondary | ICD-10-CM

## 2014-04-29 DIAGNOSIS — R634 Abnormal weight loss: Secondary | ICD-10-CM

## 2014-04-29 DIAGNOSIS — K219 Gastro-esophageal reflux disease without esophagitis: Secondary | ICD-10-CM

## 2014-04-29 DIAGNOSIS — Z7901 Long term (current) use of anticoagulants: Secondary | ICD-10-CM

## 2014-04-29 DIAGNOSIS — R1310 Dysphagia, unspecified: Secondary | ICD-10-CM

## 2014-04-29 LAB — BASIC METABOLIC PANEL
BUN: 21 mg/dL (ref 6–23)
CHLORIDE: 104 meq/L (ref 96–112)
CO2: 33 mEq/L — ABNORMAL HIGH (ref 19–32)
Calcium: 9.3 mg/dL (ref 8.4–10.5)
Creatinine, Ser: 1.22 mg/dL (ref 0.40–1.50)
GFR: 62.26 mL/min (ref >60.00)
Glucose, Bld: 126 mg/dL — ABNORMAL HIGH (ref 70–99)
Potassium: 4.9 mEq/L (ref 3.5–5.1)
Sodium: 137 mEq/L (ref 135–145)

## 2014-04-29 MED ORDER — PANTOPRAZOLE SODIUM 40 MG PO TBEC: DELAYED_RELEASE_TABLET | ORAL | Status: DC

## 2014-04-29 NOTE — Patient Instructions (Addendum)
You have been scheduled for a CT scan of the abdomen and pelvis at [place] ([address]---this is in the same building as Press photographer).   You are scheduled on 05-05-14 at 1:00pm. You should arrive 15 minutes prior to your appointment time for registration. Please follow the written instructions below on the day of your exam:  WARNING: IF YOU ARE ALLERGIC TO IODINE/X-RAY DYE, PLEASE NOTIFY RADIOLOGY IMMEDIATELY AT [phone]! YOU WILL BE GIVEN A 13 HOUR PREMEDICATION PREP.  1) Do not eat or drink anything after 9:00am (4 hours prior to your test) 2) You have been given 2 bottles of oral contrast to drink. The solution may taste               better if refrigerated, but do NOT add ice or any other liquid to this solution. Shake             well before drinking.    Drink 1 bottle of contrast @ 11:00am (2 hours prior to your exam)  Drink 1 bottle of contrast @ 12:00pm (1 hour prior to your exam)  You may take any medications as prescribed with a small amount of water except for the following: Metformin, Glucophage, Glucovance, Avandamet, Riomet, Fortamet, Actoplus Met, Janumet, Glumetza or Metaglip. The above medications must be held the day of the exam AND 48 hours after the exam.  The purpose of you drinking the oral contrast is to aid in the visualization of your intestinal tract. The contrast solution may cause some diarrhea. Before your exam is started, you will be given a small amount of fluid to drink. Depending on your individual set of symptoms, you may also receive an intravenous injection of x-ray contrast/dye. Plan on being at [facility] for 30 minutes or long, depending on the type of exam you are having performed.  This test typically takes 30-45 minutes to complete.  If you have any questions regarding your exam or if you need to reschedule, you may call the CT department at [phone] between the hours of 8:00 am and 5:00 pm,  Monday-Friday.  ________________________________________________________________________  Jay Campbell have been scheduled for a Barium Esophogram at [facility] (1st floor of the hospital) on 05-09-14 at 11:30am. Please arrive 15 minutes prior to your appointment for registration. Make certain not to have anything to eat or drink 6 hours prior to your test. If you need to reschedule for any reason, please contact radiology at [phone] to do so. __________________________________________________________________ A barium swallow is an examination that concentrates on views of the esophagus. This tends to be a double contrast exam (barium and two liquids which, when combined, create a gas to distend the wall of the oesophagus) or single contrast (non-ionic iodine based). The study is usually tailored to your symptoms so a good history is essential. Attention is paid during the study to the form, structure and configuration of the esophagus, looking for functional disorders (such as aspiration, dysphagia, achalasia, motility and reflux) EXAMINATION You may be asked to change into a gown, depending on the type of swallow being performed. A radiologist and radiographer will perform the procedure. The radiologist will advise you of the type of contrast selected for your procedure and direct you during the exam. You will be asked to stand, sit or lie in several different positions and to hold a small amount of fluid in your mouth before being asked to swallow while the imaging is performed .In some instances you may  be asked to swallow barium coated marshmallows to assess the motility of a solid food bolus. The exam can be recorded as a digital or video fluoroscopy procedure. POST PROCEDURE It will take 1-2 days for the barium to pass through your system. To facilitate this, it is important, unless otherwise directed, to increase your fluids for the next 24-48hrs and to resume your normal diet.  This test  typically takes about 30 minutes to perform.  Your physician has requested that you go to the basement for the following lab work before leaving today: BMET  You have been scheduled for an endoscopy. Please follow written instructions given to you at your visit today. If you use inhalers (even only as needed), please bring them with you on the day of your procedure. Your physician has requested that you go to www.startemmi.com and enter the access code given to you at your visit today. This web site gives a general overview about your procedure. However, you should still follow specific instructions given to you by our office regarding your preparation for the procedure.  We have sent the following medications to your pharmacy for you to pick up at your convenience: Take Protonix 91mn prior to B[place]olafor Gastroesophageal Reflux Disease When you have gastroesophageal reflux disease (GERD), the foods you eat and your eating habits are very important. Choosing the right foods can help ease the discomfort of GERD. WHAT GENERAL GUIDELINES DO I NEED TO FOLLOW?  Choose fruits, vegetables, whole grains, low-fat dairy products, and low-fat meat, fish, and poultry.  Limit fats such as oils, salad dressings, butter, nuts, and avocado.  Keep a food diary to identify foods that cause symptoms.  Avoid foods that cause reflux. These may be different for different people.  Eat frequent small meals instead of three large meals each day.  Eat your meals slowly, in a relaxed setting.  Limit fried foods.  Cook foods using methods other than frying.  Avoid drinking alcohol.  Avoid drinking large amounts of liquids with your meals.  Avoid bending over or lying down until 2-3 hours after eating. WHAT FOODS ARE NOT RECOMMENDED? The following are some foods and drinks that may worsen your symptoms: Vegetables Tomatoes. Tomato juice. Tomato and spaghetti sauce. Chili peppers. Onion  and garlic. Horseradish. Fruits Oranges, grapefruit, and lemon (fruit and juice). Meats High-fat meats, fish, and poultry. This includes hot dogs, ribs, ham, sausage, salami, and bacon. Dairy Whole milk and chocolate milk. Sour cream. Cream. Butter. Ice cream. Cream cheese.  Beverages Coffee and tea, with or without caffeine. Carbonated beverages or energy drinks. Condiments Hot sauce. Barbecue sauce.  Sweets/Desserts Chocolate and cocoa. Donuts. Peppermint and spearmint. Fats and Oils High-fat foods, including [place]fries and potato chips. Other Vinegar. Strong spices, such as black pepper, white pepper, red pepper, cayenne, curry powder, cloves, ginger, and chili powder. The items listed above may not be a complete list of foods and beverages to avoid. Contact your dietitian for more information. Document Released: 02/28/2005 Document Revised: 03/05/2013 Document Reviewed: 01/02/2013 [facility] HospitalPatient Information 2015 [place] This information is not intended to replace advice given to you by your health care provider. Make sure you discuss any questions you have with your health care provider.  __________________________________________________________________________________  Cc:Adhish SManuella Ghazi

## 2014-04-29 NOTE — Progress Notes (Signed)
Patient ID: ADRYAN SHIN, male   DOB: 1945-08-12, 69 y.o.   MRN: [id number]    HPI:   Aldrich is a 69 year old male referred for evaluation by Dr. Tilda Burrow due to GERD and dysphagia.  Carlson reports that he has had  heartburn for the majority of his adult life. He uses Rolaids multiple times throughout the day and states he rarely goes more than 2 hours without having a Rolaids. Over the past several years, his heartburn has gotten much worse. Recently in early January he was seen in the [facility] emergency room due to chest pain. Lab work showed a BUN 14, creatinine 1.0, troponin 1 negative 3, INR 1.9, hemoglobin 13.2, platelets 133. EKG showed a ventricular paced rhythm with underlying atrial fibrillation. At that hospitalization he was started on pantoprazole 40 mg daily. Despite using this he continues to have heartburn on a daily basis. He has been taking his Protonix midday, not first thing in the morning.  He also reports that for the past several years he has had difficulty swallowing meats and breads. This dysphagia has become more severe over the past 6 months. His had multiple episodes where he has had to spit his meal up because it won't go down. He has had to excuse himself from restaurants because he cannot get his food to go down. He has had episodes with dysphagia to liquids as well. He denies globus sensation. He does not feel as if his food and liquids are pooling in the back of his throat. He denies epigastric pain. He has no nausea or vomiting. He does report that his appetite has been diminished for the past year and he has lost 60-65 pounds unintentionally in the past year. He reports that he had a colonoscopy at [facility] 4 years ago for screening purposes and it was normal. He was advised to have surveillance in 10 years. He has not had any change in his bowel habits or stool caliber. He denies any bloody or tarry stools. No fever, chills, or night  sweats. He recently had a chest x-ray when in the emergency room at [facility] which was normal. He states he recently had blood work at [facility] and was told it was normal.  Kaleo has a past medical history of coronary artery disease, atrial fibrillation on chronic anticoagulation with warfarin. He is status post AV nodal ablation and has had a pacemaker placed.   Past Medical History  Diagnosis Date  . Coronary artery disease     Mild nonobstructive 1/12  . Atrial fibrillation     Permanent  . S/P AV nodal ablation     s/p SJM PPM; gen change 02-01-13 by Dr Rayann Heman  . Warfarin anticoagulation   . Hiatal hernia     Past Surgical History  Procedure Laterality Date  . Pacemaker placement  2002; 2014    SJM implanted by Dr Lovena Le with AV nodal ablation performed; gen change to [employer] SR RF by Dr Rayann Heman 02-01-13  . Pacemaker generator change Bilateral 02/01/2013    Procedure: PACEMAKER GENERATOR CHANGE;  Surgeon: Coralyn Mark, MD;  Location: [facility] CATH LAB;  Service: Cardiovascular;  Laterality: Bilateral;   History reviewed. No pertinent family history. History  Substance Use Topics  . Smoking status: Never Smoker   . Smokeless tobacco: Never Used  . Alcohol Use: No   Current Outpatient Prescriptions  Medication Sig Dispense Refill  . allopurinol (ZYLOPRIM) 100 MG tablet Take 100  mg by mouth Daily.     . clonazePAM (KLONOPIN) 0.5 MG tablet Take 0.5 mg by mouth daily.     Marland Kitchen COLCRYS 0.6 MG tablet Take 0.6 mg by mouth as needed.     . etodolac (LODINE) 400 MG tablet Take 400 mg by mouth 3 (three) times daily.    . fish oil-omega-3 fatty acids 1000 MG capsule Take 1 g by mouth 2 (two) times daily.      . insulin aspart protamine- aspart (NOVOLOG MIX 70/30) (70-30) 100 UNIT/ML injection Inject 30 Units into the skin 2 (two) times daily.    . isosorbide mononitrate (IMDUR) 60 MG 24 hr tablet Take 120 mg by mouth daily.    . metFORMIN (GLUCOPHAGE) 1000  MG tablet Take 1,000 mg by mouth 2 (two) times daily with a meal.      . metoprolol succinate (TOPROL-XL) 25 MG 24 hr tablet Take 0.5 tablets by mouth Daily.    . Multiple Vitamin (MULTIVITAMIN) tablet Take 1 tablet by mouth daily.      . nitroGLYCERIN (NITROSTAT) 0.4 MG SL tablet Place 1 tablet (0.4 mg total) under the tongue every 5 (five) minutes x 3 doses as needed. 25 tablet 3  . pantoprazole (PROTONIX) 40 MG tablet Take 40 mg by mouth daily.     . ramipril (ALTACE) 5 MG capsule Take 5 mg by mouth daily.      . sertraline (ZOLOFT) 50 MG tablet Take 1/2 tab daily    . simvastatin (ZOCOR) 40 MG tablet Take 40 mg by mouth at bedtime.      Marland Kitchen warfarin (COUMADIN) 5 MG tablet Take 5-7.5 mg by mouth daily. 7.5 mg on Monday, Wednesday, and Friday and 5 mg all other days (MANAGED BY PMD)     No current facility-administered medications for this visit.   Allergies  Allergen Reactions  . Shellfish Allergy Itching and Rash  . Sulfonamide Derivatives Itching and Rash     Review of Systems: Gen: Denies any fever, chills, sweats, anorexia, fatigue, weakness, malaise, weight loss, and sleep disorder CV: Denies chest pain, angina, palpitations, syncope, orthopnea, PND, peripheral edema, and claudication. Resp: Denies dyspnea at rest, dyspnea with exercise, cough, sputum, wheezing, coughing up blood, and pleurisy. GI: Denies vomiting blood, jaundice, and fecal incontinence.   Has progressive dysphagia to solids and liquids x several years. GU : Denies urinary burning, blood in urine, urinary frequency, urinary hesitancy, nocturnal urination, and urinary incontinence. MS: Denies joint pain, limitation of movement, and swelling, stiffness, low back pain, extremity pain. Denies muscle weakness, cramps, atrophy.  Derm: Denies rash, itching, dry skin, hives, moles, warts, or unhealing ulcers.  Psych: Denies depression, anxiety, memory loss, suicidal ideation, hallucinations, paranoia, and confusion. Heme:  Denies bruising, bleeding, and enlarged lymph nodes. Neuro:  Denies any headaches, dizziness, paresthesias. Endo:  Denies any problems with DM, thyroid, adrenal function     Physical Exam: BP 152/76 mmHg  Pulse 60  Ht 6' (1.829 m)  Wt 206 lb 6.4 oz (93.622 kg)  BMI 27.99 kg/m2 Constitutional: Pleasant,well-developed male in no acute distress. HEENT: Normocephalic and atraumatic. Conjunctivae are normal. No scleral icterus. Neck supple. No JVD. NO cervical adenopathy. Cardiovascular: Normal rate, regular rhythm.  Pulmonary/chest: Effort normal and breath sounds normal. No wheezing, rales or rhonchi. Abdominal: Soft, nondistended, nontender. Bowel sounds active throughout. There are no masses palpable. No hepatomegaly. Extremities: no edema Lymphadenopathy: No cervical adenopathy noted. Neurological: Alert and oriented to person place and time. Skin: Skin is warm  and dry. No rashes noted. Psychiatric: Normal mood and affect. Behavior is normal.  ASSESSMENT AND PLAN: 69 year old male with a long-standing history of GERD, status post a recent hospitalization for chest pain which fortunately was non-cardiac in nature. His pain may have been due to uncontrolled reflux/esophageal spasm. An antireflux regimen has been reviewed with him at length. He has been instructed to use his pantoprazole 40 mg by mouth every morning 30 minutes prior to breakfast. Due to his abnormal weight loss, the patient has signed a medical release for [place] to obtain his colonoscopy report, as well as records from his recent hospitalization at [facility]. A CT of the abdomen and pelvis will be obtained as well to help evaluate for source of his weight loss. Since he has been complaining of worsening dysphagia, an esophagram with barium pill will be obtained. He will also be scheduled for an EGD to assess for esophagitis, Barrett's, ulcer, gastritis etc. If he is noted to have strictures, dilation will be  performed.The risks, benefits, and alternatives to endoscopy with possible biopsy and possible dilation were discussed with the patient and they consent to proceed. The risk of holding anticoagulation therapy or antiplatelet medications was discussed including the increased risk for thromboembolic disease that may include DVT, pulmonary emboli and stroke. The patient understands this risk and is willing to proceed with temporally holding the medication provided that this is approved by her PCP or cardiologist. Further recommendations will be made pending the findings of the above.    Cabella Kimm, Deloris Ping 04/29/2014, 2:21 PM   Cc: Dr Manuella Ghazi

## 2014-04-29 NOTE — Telephone Encounter (Signed)
  04/29/2014   RE: JOSHAWN CRISSMAN DOB: 1945/12/10 MRN: [id number]   Dear Dr. Domenic Polite,    We have scheduled the above patient for an endoscopic procedure. Our records show that he is on anticoagulation therapy.   Please advise as to how long the patient may come off his therapy of coumadine prior to the procedure, which is scheduled for 06-05-14.  Please fax back/ or route the completed form to [employer] at [phone].   Sincerely,    Gerlean Ren

## 2014-04-30 NOTE — Telephone Encounter (Signed)
I am forwarding this to our anticoagulation clinic for review and coordination of plan.

## 2014-05-01 NOTE — Progress Notes (Signed)
Agree w/ Ms. Hvozdovic's note and mangement.  

## 2014-05-02 ENCOUNTER — Ambulatory Visit (HOSPITAL_COMMUNITY): Payer: Medicare Other

## 2014-05-05 ENCOUNTER — Inpatient Hospital Stay: Admission: RE | Admit: 2014-05-05 | Payer: Medicare Other | Source: Ambulatory Visit

## 2014-05-05 ENCOUNTER — Ambulatory Visit (HOSPITAL_COMMUNITY): Admission: RE | Admit: 2014-05-05 | Payer: Medicare Other | Source: Ambulatory Visit

## 2014-05-06 ENCOUNTER — Ambulatory Visit (INDEPENDENT_AMBULATORY_CARE_PROVIDER_SITE_OTHER)
Admission: RE | Admit: 2014-05-06 | Discharge: 2014-05-06 | Disposition: A | Discharge status: Home / Self Care | Payer: Medicare Other | Source: Ambulatory Visit | Attending: Physician Assistant | Admitting: Physician Assistant

## 2014-05-06 DIAGNOSIS — R634 Abnormal weight loss: Secondary | ICD-10-CM

## 2014-05-06 MED ORDER — IOHEXOL 300 MG/ML  SOLN
100.0000 mL | Freq: Once | INTRAMUSCULAR | Status: AC | PRN
  Administered 2014-05-06: 100 mL via INTRAVENOUS

## 2014-05-06 NOTE — Telephone Encounter (Signed)
Pt notified and verbalized understanding to hold his coumadin 5 days prior to his procedure and to call Dr. Domenic Polite office to recheck his INR.

## 2014-05-06 NOTE — Telephone Encounter (Signed)
OK for pt to hold coumadin 5 days prior to procedure.  He should resume coumadin night of procedure if OK with GI MD and recheck INR in 7 -14 days.

## 2014-05-07 ENCOUNTER — Ambulatory Visit (INDEPENDENT_AMBULATORY_CARE_PROVIDER_SITE_OTHER): Payer: Medicare Other | Admitting: Internal Medicine

## 2014-05-09 ENCOUNTER — Ambulatory Visit (HOSPITAL_COMMUNITY)
Admission: RE | Admit: 2014-05-09 | Discharge: 2014-05-09 | Disposition: A | Discharge status: Home / Self Care | Payer: Medicare Other | Source: Ambulatory Visit | Attending: Physician Assistant | Admitting: Physician Assistant

## 2014-05-09 DIAGNOSIS — K449 Diaphragmatic hernia without obstruction or gangrene: Secondary | ICD-10-CM | POA: Insufficient documentation

## 2014-05-09 DIAGNOSIS — R634 Abnormal weight loss: Secondary | ICD-10-CM | POA: Diagnosis not present

## 2014-05-09 DIAGNOSIS — R1310 Dysphagia, unspecified: Secondary | ICD-10-CM

## 2014-05-09 DIAGNOSIS — K222 Esophageal obstruction: Secondary | ICD-10-CM | POA: Insufficient documentation

## 2014-05-09 DIAGNOSIS — K224 Dyskinesia of esophagus: Secondary | ICD-10-CM | POA: Diagnosis not present

## 2014-05-12 ENCOUNTER — Other Ambulatory Visit (HOSPITAL_COMMUNITY): Payer: Self-pay | Admitting: Physician Assistant

## 2014-05-12 ENCOUNTER — Other Ambulatory Visit: Payer: Self-pay | Admitting: *Deleted

## 2014-05-12 DIAGNOSIS — R1314 Dysphagia, pharyngoesophageal phase: Secondary | ICD-10-CM

## 2014-05-12 DIAGNOSIS — R1310 Dysphagia, unspecified: Secondary | ICD-10-CM

## 2014-05-20 ENCOUNTER — Telehealth: Payer: Self-pay | Admitting: Internal Medicine

## 2014-05-20 ENCOUNTER — Ambulatory Visit (HOSPITAL_COMMUNITY)
Admission: RE | Admit: 2014-05-20 | Discharge: 2014-05-20 | Disposition: A | Discharge status: Home / Self Care | Payer: Medicare Other | Source: Ambulatory Visit | Attending: Physician Assistant | Admitting: Physician Assistant

## 2014-05-20 DIAGNOSIS — R1310 Dysphagia, unspecified: Secondary | ICD-10-CM

## 2014-05-20 DIAGNOSIS — R1314 Dysphagia, pharyngoesophageal phase: Secondary | ICD-10-CM

## 2014-05-20 NOTE — Telephone Encounter (Signed)
Informed wife that they did not need an 800# to send remote. I explained to her that the transmission is automatic and if it is not received then we would call the pt. Wife voiced understanding.

## 2014-05-20 NOTE — Procedures (Signed)
Objective Swallowing Evaluation: Modified Barium Swallowing Study  Patient Details  Name: Jay Campbell MRN: [id number] Date of Birth: 1945-03-20  Today's Date: 05/20/2014 Time: SLP Start Time (ACUTE ONLY): 1300-SLP Stop Time (ACUTE ONLY): 1325 SLP Time Calculation (min) (ACUTE ONLY): 25 min  Past Medical History:  Past Medical History  Diagnosis Date  . Coronary artery disease     Mild nonobstructive 1/12  . Atrial fibrillation     Permanent  . S/P AV nodal ablation     s/p SJM PPM; gen change 02-01-13 by Dr Rayann Heman  . Warfarin anticoagulation   . Hiatal hernia    Past Surgical History:  Past Surgical History  Procedure Laterality Date  . Pacemaker placement  2002; 2014    SJM implanted by Dr Lovena Le with AV nodal ablation performed; gen change to [employer] SR RF by Dr Rayann Heman 02-01-13  . Pacemaker generator change Bilateral 02/01/2013    Procedure: PACEMAKER GENERATOR CHANGE;  Surgeon: Coralyn Mark, MD;  Location: [facility] CATH LAB;  Service: Cardiovascular;  Laterality: Bilateral;   HPI:  HPI: 69 year old male referred for outpatient MBS due to worsening difficulty swallowing, with report of choking mainly on meats/breads. PMH significant for reflux, diabetes. Pt underwent a Regular Barium Swallow 05/09/14 where esophageal dysmotility was seen, along with a 8mm Schatzki's Ring and silent aspiration of barium  No Data Recorded  Assessment / Plan / Recommendation CHL IP CLINICAL IMPRESSIONS 05/20/2014  Dysphagia Diagnosis (None)  Clinical impression Mild pharyngeal dysphagia, characterized by delayed swallow reflex across consistencies, due to decreased sensation for primary trigger site. Reflex was seen to trigger at the vallecula on puree and solid consistencies, and at the pyriform sinus on thin liquids, increasing aspiration risk of thin liquids. Trace vallecular residue was noted across consistencies, due to decreased tongue base retraction. Dry swallow cleared residue. Pyriform  residue was noted on thin liquids, which also cleared with dry swallow.  Frank aspiration occurred during large consecutive boluses of thin liquid via straw. No penetration or aspiration noted when small, individual cup sips were taken. Of note, pt exhibited silent aspiration of thin liquids on Barium Swallow, likely due to reclined position and straw use. No overt difficulty with multiconsistency (cracker/puree) or barium tablet, and esophageal sweep did not reveal stasis or backflow. Recommend continuing with regular consistency solids, taking very small bites. Thin liquids ok, but avoid straws. 2 swallows per bite/sip recommended. Pt was given written suggestions on managing esophageal dysmotility, and reports an esophageal dilation of the Schatzki's Ring is scheduled for later this month.       CHL IP TREATMENT RECOMMENDATION 05/20/2014  Treatment Plan Recommendations No treatment recommended at this time     CHL IP DIET RECOMMENDATION 05/20/2014  Diet Recommendations Regular;Thin liquid  Liquid Administration via Cup;No straw  Medication Administration Whole meds with liquid  Compensations Slow rate;Small sips/bites;Follow solids with liquid;Multiple dry swallows after each bite/sip  Postural Changes and/or Swallow Maneuvers Seated upright 90 degrees;Upright 30-60 min after meal     CHL IP OTHER RECOMMENDATIONS 05/20/2014  Recommended Consults (None)  Oral Care Recommendations Oral care BID  Other Recommendations (None)     CHL IP FOLLOW UP RECOMMENDATIONS 05/20/2014  Follow up Recommendations None     No flowsheet data found.   Pertinent Vitals/Pain No pain reported    SLP Swallow Goals No flowsheet data found.  No flowsheet data found.    CHL IP REASON FOR REFERRAL 05/20/2014  Reason for Referral Objectively evaluate swallowing function  CHL IP ORAL PHASE 05/20/2014  Lips (None)  Tongue (None)  Mucous membranes (None)  Nutritional status (None)  Other (None)  Oxygen therapy  (None)  Oral Phase WFL  Oral - Pudding Teaspoon (None)  Oral - Pudding Cup (None)  Oral - Honey Teaspoon (None)  Oral - Honey Cup (None)  Oral - Honey Syringe (None)  Oral - Nectar Teaspoon (None)  Oral - Nectar Cup (None)  Oral - Nectar Straw (None)  Oral - Nectar Syringe (None)  Oral - Ice Chips (None)  Oral - Thin Teaspoon (None)  Oral - Thin Cup (None)  Oral - Thin Straw (None)  Oral - Thin Syringe (None)  Oral - Puree (None)  Oral - Mechanical Soft (None)  Oral - Regular (None)  Oral - Multi-consistency (None)  Oral - Pill (None)  Oral Phase - Comment (None)      CHL IP PHARYNGEAL PHASE 05/20/2014  Pharyngeal Phase Impaired  Pharyngeal - Pudding Teaspoon (None)  Penetration/Aspiration details (pudding teaspoon) (None)  Pharyngeal - Pudding Cup (None)  Penetration/Aspiration details (pudding cup) (None)  Pharyngeal - Honey Teaspoon (None)  Penetration/Aspiration details (honey teaspoon) (None)  Pharyngeal - Honey Cup (None)  Penetration/Aspiration details (honey cup) (None)  Pharyngeal - Honey Syringe (None)  Penetration/Aspiration details (honey syringe) (None)  Pharyngeal - Nectar Teaspoon (None)  Penetration/Aspiration details (nectar teaspoon) (None)  Pharyngeal - Nectar Cup (None)  Penetration/Aspiration details (nectar cup) (None)  Pharyngeal - Nectar Straw (None)  Penetration/Aspiration details (nectar straw) (None)  Pharyngeal - Nectar Syringe (None)  Penetration/Aspiration details (nectar syringe) (None)  Pharyngeal - Ice Chips (None)  Penetration/Aspiration details (ice chips) (None)  Pharyngeal - Thin Teaspoon (None)  Penetration/Aspiration details (thin teaspoon) (None)  Pharyngeal - Thin Cup Delayed swallow initiation;Premature spillage to valleculae;Premature spillage to pyriform sinuses;Reduced tongue base retraction;Pharyngeal residue - valleculae;Pharyngeal residue - pyriform sinuses;Compensatory strategies attempted (Comment)   Penetration/Aspiration details (thin cup) (None)  Pharyngeal - Thin Straw Delayed swallow initiation;Premature spillage to valleculae;Premature spillage to pyriform sinuses;Reduced tongue base retraction;Penetration/Aspiration during swallow;Moderate aspiration;Pharyngeal residue - pyriform sinuses;Pharyngeal residue - valleculae;Reduced airway/laryngeal closure;Compensatory strategies attempted (Comment)  Penetration/Aspiration details (thin straw) Material enters airway, passes BELOW cords and not ejected out despite cough attempt by patient  Pharyngeal - Thin Syringe (None)  Penetration/Aspiration details (thin syringe') (None)  Pharyngeal - Puree Delayed swallow initiation;Premature spillage to valleculae;Compensatory strategies attempted (Comment)  Penetration/Aspiration details (puree) (None)  Pharyngeal - Mechanical Soft (None)  Penetration/Aspiration details (mechanical soft) (None)  Pharyngeal - Regular (None)  Penetration/Aspiration details (regular) (None)  Pharyngeal - Multi-consistency Delayed swallow initiation;Premature spillage to valleculae;Pharyngeal residue - valleculae;Reduced tongue base retraction;Compensatory strategies attempted (Comment)  Penetration/Aspiration details (multi-consistency) (None)  Pharyngeal - Pill WFL  Penetration/Aspiration details (pill) (None)  Pharyngeal Comment (None)     CHL IP CERVICAL ESOPHAGEAL PHASE 05/20/2014  Cervical Esophageal Phase WFL  Pudding Teaspoon (None)  Pudding Cup (None)  Honey Teaspoon (None)  Honey Cup (None)  Honey Syringe (None)  Nectar Teaspoon (None)  Nectar Cup (None)  Nectar Straw (None)  Nectar Syringe (None)  Thin Teaspoon (None)  Thin Cup (None)  Thin Straw (None)  Thin Syringe (None)  Cervical Esophageal Comment (None)    CHL IP GO 05/20/2014  Functional Assessment Tool Used ASHA NOMS  Functional Limitations (None)  Swallow Current Status (U4403) CI  Swallow Goal Status (K7425) CI  Swallow Discharge  Status (Z5638) CI  Motor Speech Current Status (V5643) (None)  Motor Speech Goal Status (P2951) (None)  Motor Speech Goal Status (O8416) (  None)  Spoken Language Comprehension Current Status [phone]) (None)  Spoken Language Comprehension Goal Status [phone]) (None)  Spoken Language Comprehension Discharge Status [phone]) (None)  Spoken Language Expression Current Status [phone]) (None)  Spoken Language Expression Goal Status [phone]) (None)  Spoken Language Expression Discharge Status [phone]) (None)  Attention Current Status (Y3709) (None)  Attention Goal Status (U4383) (None)  Attention Discharge Status [phone]) (None)  Memory Current Status (R7543) (None)  Memory Goal Status (K0677) (None)  Memory Discharge Status (C3403) (None)  Voice Current Status (T2481) (None)  Voice Goal Status (Y5909) (None)  Voice Discharge Status (P1121) (None)  Other Speech-Language Pathology Functional Limitation [phone]) (None)  Other Speech-Language Pathology Functional Limitation Goal Status (X5072) (None)  Other Speech-Language Pathology Functional Limitation Discharge Status [phone]) (None)          Cadyn Fann B. [place], [place] [facility], CCC-SLP [phone]  Shonna Chock 05/20/2014, 2:00 PM

## 2014-05-20 NOTE — Telephone Encounter (Signed)
[id number]//sss 

## 2014-05-20 NOTE — Telephone Encounter (Signed)
Follow Up  Pt called request a call back to discuss sending a remote signal... Unable to find the 800# on the back of the remote device. Please call

## 2014-05-20 NOTE — Telephone Encounter (Signed)
Follow Up  Pt wife returned call

## 2014-05-22 ENCOUNTER — Ambulatory Visit (INDEPENDENT_AMBULATORY_CARE_PROVIDER_SITE_OTHER): Payer: Medicare Other | Admitting: *Deleted

## 2014-05-22 DIAGNOSIS — I442 Atrioventricular block, complete: Secondary | ICD-10-CM

## 2014-05-22 DIAGNOSIS — Z950 Presence of cardiac pacemaker: Secondary | ICD-10-CM

## 2014-05-22 LAB — MDC_IDC_ENUM_SESS_TYPE_REMOTE
Battery Remaining Longevity: 144 mo
Battery Remaining Percentage: 95.5 %
Battery Voltage: 3.02 V
Brady Statistic RV Percent Paced: 99 %
Date Time Interrogation Session: 20160310101936
Implantable Pulse Generator Model: 1240
Implantable Pulse Generator Serial Number: 7488472
Lead Channel Impedance Value: 630 Ohm
Lead Channel Pacing Threshold Amplitude: 1.125 V
Lead Channel Setting Pacing Amplitude: 1.375
Lead Channel Setting Pacing Pulse Width: 0.4 ms
MDC IDC MSMT LEADCHNL RV PACING THRESHOLD PULSEWIDTH: 0.4 ms
MDC IDC SET LEADCHNL RV SENSING SENSITIVITY: 6 mV

## 2014-05-22 NOTE — Progress Notes (Signed)
Remote pacemaker transmission.   

## 2014-05-26 ENCOUNTER — Encounter: Payer: Self-pay | Admitting: Cardiology

## 2014-05-30 ENCOUNTER — Encounter: Payer: Self-pay | Admitting: Internal Medicine

## 2014-06-05 ENCOUNTER — Encounter: Payer: Self-pay | Admitting: Gastroenterology

## 2014-06-05 ENCOUNTER — Ambulatory Visit (AMBULATORY_SURGERY_CENTER): Payer: Medicare Other | Admitting: Gastroenterology

## 2014-06-05 VITALS — BP 128/64 | HR 62 | Temp 98.0°F | Resp 15 | Ht 72.0 in | Wt 206.0 lb

## 2014-06-05 DIAGNOSIS — K219 Gastro-esophageal reflux disease without esophagitis: Secondary | ICD-10-CM

## 2014-06-05 DIAGNOSIS — K222 Esophageal obstruction: Secondary | ICD-10-CM | POA: Diagnosis not present

## 2014-06-05 LAB — GLUCOSE, CAPILLARY
Glucose-Capillary: 130 mg/dL — ABNORMAL HIGH (ref 70–99)
Glucose-Capillary: 122 mg/dL — ABNORMAL HIGH (ref 70–99)

## 2014-06-05 MED ORDER — SODIUM CHLORIDE 0.9 % IV SOLN: 500.0000 mL | INTRAVENOUS | Status: DC

## 2014-06-05 NOTE — Op Note (Signed)
[facility]  [address] [place], [address]   1ENDOSCOPY PROCEDURE REPORT  PATIENT: Jay, Campbell  MR#: [id number] BIRTHDATE: Sep 02, 1945 , 28  yrs. old GENDER: male ENDOSCOPIST: Inda Castle, MD REFERRED BY:  Monico Blitz, M.D. PROCEDURE DATE:  06/05/2014 PROCEDURE:  EGD w/ balloon dilation ASA CLASS:     Class II INDICATIONS:  dysphagia and Barium swallow demonstrates a distal esophageal stricture. MEDICATIONS: Monitored anesthesia care, Propofol 200 mg IV, and Lidocaine 200 mg IV TOPICAL ANESTHETIC:  DESCRIPTION OF PROCEDURE: After the risks benefits and alternatives of the procedure were thoroughly explained, informed consent was obtained.  The LB [id number] endoscope was introduced through the mouth and advanced to the second portion of the duodenum , Without limitations.  The instrument was slowly withdrawn as the mucosa was fully examined.    ESOPHAGUS: There was a peptic stricture at the gastroesophageal junction.  The stricture was easily traversable.  Using a TTS-balloon the stricture was dilated up to 72mm. numbers15?" 16-31millimeter balloons were inflated for 30 seconds each.  There was mild to the 18 mm balloon.  There was no heme.The balloon was held inflated for 30 seconds.   Except for the findings listed, the EGD was otherwise normal.  Retroflexed views revealed no abnormalities.     The scope was then withdrawn from the patient and the procedure completed.  COMPLICATIONS: There were no immediate complications.  ENDOSCOPIC IMPRESSION: 1.   There was a stricture at the gastroesophageal junction; Using a TTS-balloon the stricture was dilated up to 37mm; The balloon was held inflated for 30 seconds 2.   EGD was otherwise normal  RECOMMENDATIONS: resume Coumadin today  REPEAT EXAM:  eSigned:  Inda Castle, MD 06/05/2014 3:30 PM    CC:

## 2014-06-05 NOTE — Progress Notes (Signed)
Report to PACU, RN, vss, BBS= Clear.  

## 2014-06-05 NOTE — Progress Notes (Signed)
No problems noted in the recovery room. maw 

## 2014-06-05 NOTE — Progress Notes (Signed)
Called to room to assist during endoscopic procedure.  Patient ID and intended procedure confirmed with present staff. Received instructions for my participation in the procedure from the performing physician.  

## 2014-06-05 NOTE — Patient Instructions (Addendum)
YOU HAD AN ENDOSCOPIC PROCEDURE TODAY AT [facility]:   Refer to the procedure report that was given to you for any specific questions about what was found during the examination.  If the procedure report does not answer your questions, please call your gastroenterologist to clarify.  If you requested that your care partner not be given the details of your procedure findings, then the procedure report has been included in a sealed envelope for you to review at your convenience later.  YOU SHOULD EXPECT: Some feelings of bloating in the abdomen. Passage of more gas than usual.  Walking can help get rid of the air that was put into your GI tract during the procedure and reduce the bloating. If you had a lower endoscopy (such as a colonoscopy or flexible sigmoidoscopy) you may notice spotting of blood in your stool or on the toilet paper. If you underwent a bowel prep for your procedure, you may not have a normal bowel movement for a few days.  Please Note:  You might notice some irritation and congestion in your nose or some drainage.  This is from the oxygen used during your procedure.  There is no need for concern and it should clear up in a day or so.  SYMPTOMS TO REPORT IMMEDIATELY:   Following upper endoscopy (EGD)  Vomiting of blood or coffee ground material  New chest pain or pain under the shoulder blades  Painful or persistently difficult swallowing  New shortness of breath  Fever of 100F or higher  Black, tarry-looking stools  For urgent or emergent issues, a gastroenterologist can be reached at any hour by calling [phone].   DIET: Please follow the dilatation diet the rest of the day.  Handout was given to your care partner.  Drink plenty of fluids but you should avoid alcoholic beverages for 24 hours.  ACTIVITY:  You should plan to take it easy for the rest of today and you should NOT DRIVE or use heavy machinery until tomorrow (because of the sedation  medicines used during the test).    FOLLOW UP: Our staff will call the number listed on your records the next business day following your procedure to check on you and address any questions or concerns that you may have regarding the information given to you following your procedure. If we do not reach you, we will leave a message.  However, if you are feeling well and you are not experiencing any problems, there is no need to return our call.  We will assume that you have returned to your regular daily activities without incident.  If any biopsies were taken you will be contacted by phone or by letter within the next 1-3 weeks.  Please call us at [phone] if you have not heard about the biopsies in 3 weeks.    SIGNATURES/CONFIDENTIALITY: You and/or your care partner have signed paperwork which will be entered into your electronic medical record.  These signatures attest to the fact that that the information above on your After Visit Summary has been reviewed and is understood.  Full responsibility of the confidentiality of this discharge information lies with you and/or your care-partner.    Handout was given to your care partner on a dilatation diet.. You might notice some irritation in your nose or drainage.  This may cause feelings of congestion.  This is from the oxygen, which can be drying.  This is no cause for concern; this should clear up  in a few days.  You may resume your current medications today.  Resume coumadin today. Your blood sugar was 122 in the recovery room. Please call if any questions or concerns.

## 2014-06-09 ENCOUNTER — Telehealth: Payer: Self-pay

## 2014-06-09 NOTE — Telephone Encounter (Signed)
No answer, left message

## 2014-08-13 ENCOUNTER — Encounter: Payer: Self-pay | Admitting: Cardiology

## 2014-08-13 ENCOUNTER — Ambulatory Visit (INDEPENDENT_AMBULATORY_CARE_PROVIDER_SITE_OTHER): Payer: Medicare Other | Admitting: Cardiology

## 2014-08-13 VITALS — BP 118/64 | HR 63 | Ht 72.0 in | Wt 204.0 lb

## 2014-08-13 DIAGNOSIS — Z950 Presence of cardiac pacemaker: Secondary | ICD-10-CM

## 2014-08-13 DIAGNOSIS — I4821 Permanent atrial fibrillation: Secondary | ICD-10-CM

## 2014-08-13 DIAGNOSIS — I2510 Atherosclerotic heart disease of native coronary artery without angina pectoris: Secondary | ICD-10-CM | POA: Diagnosis not present

## 2014-08-13 DIAGNOSIS — I482 Chronic atrial fibrillation: Secondary | ICD-10-CM | POA: Diagnosis not present

## 2014-08-13 NOTE — Patient Instructions (Signed)

## 2014-08-13 NOTE — Progress Notes (Signed)
Cardiology Office Note  Date: 08/13/2014   ID: [name], DOB 30-Oct-1945, MRN [id number]  PCP: Monico Blitz, MD  Primary Cardiologist: Rozann Lesches, MD   Chief Complaint  Patient presents with  . Atrial Fibrillation    History of Present Illness: Jay Campbell is a 69 y.o. male last seen in January 2016. He is here today with his wife for a follow-up visit. From a cardiac perspective, he reports no progressive angina symptoms or palpitations with typical ADLs.  We reviewed his medications, he continues on Coumadin without any significant bleeding problems.  Device follow-up continues with Dr. Rayann Heman.  Follow-up ischemic testing from January of this year was low risk as outlined below.  Past Medical History  Diagnosis Date  . Coronary artery disease     Mild nonobstructive 1/12  . Atrial fibrillation     Permanent  . S/P AV nodal ablation     s/p SJM PPM; gen change 02-01-13 by Dr Rayann Heman  . Warfarin anticoagulation   . Hiatal hernia     Current Outpatient Prescriptions  Medication Sig Dispense Refill  . allopurinol (ZYLOPRIM) 100 MG tablet Take 100 mg by mouth Daily.     . clonazePAM (KLONOPIN) 0.5 MG tablet Take 0.5 mg by mouth daily.     Marland Kitchen COLCRYS 0.6 MG tablet Take 0.6 mg by mouth as needed.     . fish oil-omega-3 fatty acids 1000 MG capsule Take 1 g by mouth 2 (two) times daily.      Marland Kitchen HUMULIN 70/30 (70-30) 100 UNIT/ML injection     . isosorbide mononitrate (IMDUR) 60 MG 24 hr tablet Take 120 mg by mouth daily.    . metFORMIN (GLUCOPHAGE) 1000 MG tablet Take 1,000 mg by mouth 2 (two) times daily with a meal.      . metoprolol succinate (TOPROL-XL) 25 MG 24 hr tablet Take 0.5 tablets by mouth Daily.    . nitroGLYCERIN (NITROSTAT) 0.4 MG SL tablet Place 1 tablet (0.4 mg total) under the tongue every 5 (five) minutes x 3 doses as needed. 25 tablet 3  . ONE TOUCH ULTRA TEST test strip     . pantoprazole (PROTONIX) 40 MG tablet Take 1 tab by mouth 30 min  prior to breakfast daily 30 tablet 3  . ramipril (ALTACE) 5 MG capsule Take 5 mg by mouth daily.      . sertraline (ZOLOFT) 50 MG tablet Take 1/2 tab daily    . simvastatin (ZOCOR) 40 MG tablet Take 40 mg by mouth at bedtime.      Marland Kitchen warfarin (COUMADIN) 5 MG tablet Take 5-7.5 mg by mouth daily. 7.5 mg on Monday, Wednesday, and Friday and 5 mg all other days (MANAGED BY PMD)     No current facility-administered medications for this visit.    Allergies:  Shellfish allergy and Sulfonamide derivatives   Social History: The patient  reports that he has never smoked. He has never used smokeless tobacco. He reports that he does not drink alcohol or use illicit drugs.   ROS:  Please see the history of present illness. Otherwise, complete review of systems is positive for none.  All other systems are reviewed and negative.   Physical Exam: VS:  BP 118/64 mmHg  Pulse 63  Ht 6' (1.829 m)  Wt 204 lb (92.534 kg)  BMI 27.66 kg/m2  SpO2 99%, BMI Body mass index is 27.66 kg/(m^2).  Wt Readings from Last 3 Encounters:  08/13/14 204 lb (92.534 kg)  06/05/14 206 lb (93.441 kg)  04/29/14 206 lb 6.4 oz (93.622 kg)     No acute distress.  HEENT: Conjunctiva and lids normal, oropharynx clear.  Neck: Supple, no elevated JVP or carotid bruits, no thyromegaly.  Lungs: Clear to auscultation, nonlabored breathing at rest.  Cardiac: Regular rate and rhythm, no S3 or significant systolic murmur, no pericardial rub.  Abdomen: Soft, bowel sounds present.  Extremities: No pitting edema, distal pulses 2+. Skin: Warm and dry. Musculoskeletal: No kyphosis. Neuropsychiatric: Alert and oriented 3, affect appropriate.   ECG: ECG is not ordered today.   Recent Labwork: 04/29/2014: BUN 21; Creatinine 1.22; Potassium 4.9; Sodium 137   Other Studies Reviewed Today:  Lexiscan Cardiolite 03/26/2014: FINDINGS: Stress/ECG data: The patient was stressed according to the Lexi scan protocol. The heart rate  remain in the 60 beats per min range. The blood pressure averaged 120/60. No chest pain was reported.  ECG demonstrated a paced ventricular rhythm with underlying atrial fibrillation. There were no changes with stress.  Perfusion: No perfusion defects to suggest ischemia or infarction.  Wall Motion: Normal regional wall motion. No left ventricular dilation.  Left Ventricular Ejection Fraction: 64 %  End diastolic volume 270 ml  End systolic volume 45 ml  IMPRESSION: 1. No evidence of myocardial ischemia or scar.  2. Normal regional wall motion.  3. Left ventricular ejection fraction 64%  4. Low-risk stress test findings*.   Assessment and Plan:  1. Chronic atrial fibrillation, continue current medications including Coumadin. He is relatively asymptomatic.  2. Status post AV node ablation with [employer] pacemaker, followed by Dr. Rayann Heman.  3. Previously documented history of nonobstructive CAD with negative follow-up ischemic workup in January of this year.  Current medicines were reviewed with the patient today.   Disposition: FU with me in 1 year.   Signed, Satira Sark, MD, [facility] 08/13/2014 9:47 AM    [facility] [place], [place], [place] [address] Phone: [phone]; Fax: [phone]

## 2014-08-21 ENCOUNTER — Encounter: Payer: Self-pay | Admitting: Internal Medicine

## 2014-08-21 ENCOUNTER — Ambulatory Visit (INDEPENDENT_AMBULATORY_CARE_PROVIDER_SITE_OTHER): Payer: Medicare Other | Admitting: *Deleted

## 2014-08-21 DIAGNOSIS — I442 Atrioventricular block, complete: Secondary | ICD-10-CM

## 2014-08-21 NOTE — Progress Notes (Signed)
Remote pacemaker transmission.   

## 2014-08-25 LAB — CUP PACEART REMOTE DEVICE CHECK
Battery Remaining Longevity: 143 mo
Battery Voltage: 3.02 V
Brady Statistic RV Percent Paced: 99 %
Date Time Interrogation Session: 20160609060016
Lead Channel Impedance Value: 590 Ohm
Lead Channel Setting Pacing Amplitude: 1.375
Lead Channel Setting Pacing Pulse Width: 0.4 ms
MDC IDC MSMT BATTERY REMAINING PERCENTAGE: 95.5 %
MDC IDC MSMT LEADCHNL RV PACING THRESHOLD AMPLITUDE: 1.125 V
MDC IDC MSMT LEADCHNL RV PACING THRESHOLD PULSEWIDTH: 0.4 ms
MDC IDC SET LEADCHNL RV SENSING SENSITIVITY: 6 mV
Pulse Gen Serial Number: 7488472

## 2014-09-02 ENCOUNTER — Encounter: Payer: Self-pay | Admitting: Cardiology

## 2014-11-12 ENCOUNTER — Encounter (HOSPITAL_COMMUNITY): Payer: Self-pay

## 2014-11-12 ENCOUNTER — Emergency Department (HOSPITAL_COMMUNITY)
Admission: EM | Admit: 2014-11-12 | Discharge: 2014-11-12 | Disposition: A | Discharge status: Home / Self Care | Payer: Medicare Other | Attending: Emergency Medicine | Admitting: Emergency Medicine

## 2014-11-12 ENCOUNTER — Emergency Department (HOSPITAL_COMMUNITY): Payer: Medicare Other

## 2014-11-12 DIAGNOSIS — I2510 Atherosclerotic heart disease of native coronary artery without angina pectoris: Secondary | ICD-10-CM | POA: Insufficient documentation

## 2014-11-12 DIAGNOSIS — Z79899 Other long term (current) drug therapy: Secondary | ICD-10-CM | POA: Diagnosis not present

## 2014-11-12 DIAGNOSIS — R079 Chest pain, unspecified: Secondary | ICD-10-CM

## 2014-11-12 DIAGNOSIS — Z950 Presence of cardiac pacemaker: Secondary | ICD-10-CM | POA: Insufficient documentation

## 2014-11-12 DIAGNOSIS — Z8719 Personal history of other diseases of the digestive system: Secondary | ICD-10-CM | POA: Diagnosis not present

## 2014-11-12 DIAGNOSIS — I4891 Unspecified atrial fibrillation: Secondary | ICD-10-CM | POA: Diagnosis not present

## 2014-11-12 DIAGNOSIS — Z7901 Long term (current) use of anticoagulants: Secondary | ICD-10-CM | POA: Insufficient documentation

## 2014-11-12 DIAGNOSIS — Z792 Long term (current) use of antibiotics: Secondary | ICD-10-CM | POA: Diagnosis not present

## 2014-11-12 DIAGNOSIS — Z794 Long term (current) use of insulin: Secondary | ICD-10-CM | POA: Diagnosis not present

## 2014-11-12 LAB — BASIC METABOLIC PANEL
Anion gap: 5 (ref 5–15)
BUN: 22 mg/dL — ABNORMAL HIGH (ref 6–20)
CO2: 22 mmol/L (ref 22–32)
CREATININE: 1.14 mg/dL (ref 0.61–1.24)
Calcium: 8.3 mg/dL — ABNORMAL LOW (ref 8.9–10.3)
Chloride: 109 mmol/L (ref 101–111)
Glucose, Bld: 277 mg/dL — ABNORMAL HIGH (ref 65–99)
Potassium: 4.1 mmol/L (ref 3.5–5.1)
SODIUM: 136 mmol/L (ref 135–145)

## 2014-11-12 LAB — CBC WITH DIFFERENTIAL/PLATELET
BASOS PCT: 0 % (ref 0–1)
Basophils Absolute: 0 10*3/uL (ref 0.0–0.1)
EOS PCT: 2 % (ref 0–5)
Eosinophils Absolute: 0.1 10*3/uL (ref 0.0–0.7)
HCT: 37.3 % — ABNORMAL LOW (ref 39.0–52.0)
Hemoglobin: 13.2 g/dL (ref 13.0–17.0)
LYMPHS ABS: 1.8 10*3/uL (ref 0.7–4.0)
Lymphocytes Relative: 35 % (ref 12–46)
MCH: 35.9 pg — AB (ref 26.0–34.0)
MCHC: 35.4 g/dL (ref 30.0–36.0)
MCV: 101.4 fL — ABNORMAL HIGH (ref 78.0–100.0)
MONOS PCT: 10 % (ref 3–12)
Monocytes Absolute: 0.5 10*3/uL (ref 0.1–1.0)
Neutro Abs: 2.7 10*3/uL (ref 1.7–7.7)
Neutrophils Relative %: 53 % (ref 43–77)
PLATELETS: 102 10*3/uL — AB (ref 150–400)
RBC: 3.68 MIL/uL — ABNORMAL LOW (ref 4.22–5.81)
RDW: 12.8 % (ref 11.5–15.5)
WBC: 5.1 10*3/uL (ref 4.0–10.5)

## 2014-11-12 LAB — TROPONIN I

## 2014-11-12 NOTE — Discharge Instructions (Signed)
Follow up with your md if any more problems

## 2014-11-12 NOTE — ED Notes (Signed)
MD at bedside. 

## 2014-11-12 NOTE — ED Provider Notes (Addendum)
CSN: [id number]     Arrival date & time 11/12/14  1132 History  This chart was scribed for Jay Ferguson, MD by Tula Nakayama, ED Scribe. This patient was seen in room APA18/APA18 and the patient's care was started at 12:06 PM.    Chief Complaint  Patient presents with  . Chest Pain   Patient is a 69 y.o. male presenting with chest pain. The history is provided by the patient. No language interpreter was used.  Chest Pain Pain quality: pressure   Pain radiates to:  Does not radiate Pain radiates to the back: no   Pain severity:  Moderate Onset quality:  Sudden Duration:  15 minutes Progression:  Resolved Chronicity:  New Context: stress   Relieved by:  None tried Worsened by:  Nothing tried Ineffective treatments:  None tried Associated symptoms: no abdominal pain, no back pain, no cough, no diaphoresis, no fatigue, no headache and no shortness of breath    HPI Comments: Jay Campbell is a 69 y.o. male with a history of CAD and AFib who presents to the Emergency Department complaining of one episode of sudden onset, pressure-like CP that occurred several minutes ago, lasted 15 minutes and is currently resolved. Pt reports onset of pain started after his wife passed in the hospital. He denies diaphoresis and SOB as associated symptoms.   Past Medical History  Diagnosis Date  . Coronary artery disease     Mild nonobstructive 1/12  . Atrial fibrillation     Permanent  . S/P AV nodal ablation     s/p SJM PPM; gen change 02-01-13 by Dr Rayann Heman  . Warfarin anticoagulation   . Hiatal hernia    Past Surgical History  Procedure Laterality Date  . Pacemaker placement  2002; 2014    SJM implanted by Dr Lovena Le with AV nodal ablation performed; gen change to [employer] SR RF by Dr Rayann Heman 02-01-13  . Pacemaker generator change Bilateral 02/01/2013    Procedure: PACEMAKER GENERATOR CHANGE;  Surgeon: Coralyn Mark, MD;  Location: [facility] CATH LAB;  Service: Cardiovascular;  Laterality:  Bilateral;   Family History  Problem Relation Age of Onset  . Stomach cancer Neg Hx   . Colon cancer Neg Hx    Social History  Substance Use Topics  . Smoking status: Never Smoker   . Smokeless tobacco: Never Used  . Alcohol Use: No    Review of Systems  Constitutional: Negative for diaphoresis, appetite change and fatigue.  HENT: Negative for congestion, ear discharge and sinus pressure.   Eyes: Negative for discharge.  Respiratory: Negative for cough and shortness of breath.   Cardiovascular: Positive for chest pain.  Gastrointestinal: Negative for abdominal pain and diarrhea.  Genitourinary: Negative for frequency and hematuria.  Musculoskeletal: Negative for back pain.  Skin: Negative for rash.  Neurological: Negative for seizures and headaches.  Psychiatric/Behavioral: Negative for hallucinations.     Allergies  Contrast media; Shellfish allergy; and Sulfonamide derivatives  Home Medications   Prior to Admission medications   Medication Sig Start Date End Date Taking? Authorizing Provider  allopurinol (ZYLOPRIM) 100 MG tablet Take 100 mg by mouth Daily.  12/15/11  Yes Historical Provider, MD  cephALEXin (KEFLEX) 500 MG capsule Take 500 mg by mouth 4 (four) times daily.   Yes Historical Provider, MD  clonazePAM (KLONOPIN) 0.5 MG tablet Take 0.5 mg by mouth daily.  03/11/14  Yes Historical Provider, MD  COLCRYS 0.6 MG tablet Take 0.6 mg by mouth as needed.  04/03/12  Yes Historical Provider, MD  fish oil-omega-3 fatty acids 1000 MG capsule Take 1 g by mouth 2 (two) times daily.     Yes Historical Provider, MD  HUMULIN 70/30 (70-30) 100 UNIT/ML injection Inject 30 Units into the skin 2 (two) times daily with a meal.  05/19/14  Yes Historical Provider, MD  isosorbide mononitrate (IMDUR) 60 MG 24 hr tablet Take 120 mg by mouth daily. 09/26/11  Yes Ezra Sites, MD  metFORMIN (GLUCOPHAGE) 1000 MG tablet Take 1,000 mg by mouth 2 (two) times daily with a meal.     Yes Historical  Provider, MD  metoprolol succinate (TOPROL-XL) 25 MG 24 hr tablet Take 0.5 tablets by mouth Daily. 12/30/11  Yes Historical Provider, MD  nitroGLYCERIN (NITROSTAT) 0.4 MG SL tablet Place 1 tablet (0.4 mg total) under the tongue every 5 (five) minutes x 3 doses as needed. 02/21/14  Yes Thompson Grayer, MD  ONE TOUCH ULTRA TEST test strip  05/29/14  Yes Historical Provider, MD  oxyCODONE (OXY IR/ROXICODONE) 5 MG immediate release tablet Take 5 mg by mouth every 4 (four) hours as needed.  11/11/14  Yes Historical Provider, MD  pantoprazole (PROTONIX) 40 MG tablet Take 1 tab by mouth 30 min prior to breakfast daily 04/29/14  Yes Lori P Hvozdovic, PA-C  ramipril (ALTACE) 5 MG capsule Take 5 mg by mouth daily.     Yes Historical Provider, MD  sertraline (ZOLOFT) 50 MG tablet Take 1/2 tab daily   Yes Historical Provider, MD  simvastatin (ZOCOR) 40 MG tablet Take 40 mg by mouth at bedtime.     Yes Historical Provider, MD  tamsulosin (FLOMAX) 0.4 MG CAPS capsule Take 0.4 mg by mouth daily.  11/11/14  Yes Historical Provider, MD  warfarin (COUMADIN) 5 MG tablet Take 5-7.5 mg by mouth daily. 7.5 mg on Monday, Wednesday, and Friday and 5 mg all other days (MANAGED BY PMD)   Yes Historical Provider, MD   BP 113/61 mmHg  Pulse 60  Resp 14  SpO2 98% Physical Exam  Constitutional: He is oriented to person, place, and time. He appears well-developed.  HENT:  Head: Normocephalic.  Eyes: Conjunctivae and EOM are normal. No scleral icterus.  Neck: Neck supple. No thyromegaly present.  Cardiovascular: Normal rate and regular rhythm.  Exam reveals no gallop and no friction rub.   No murmur heard. Pulmonary/Chest: No stridor. He has no wheezes. He has no rales. He exhibits no tenderness.  Abdominal: He exhibits no distension. There is no tenderness. There is no rebound.  Musculoskeletal: Normal range of motion. He exhibits no edema.  Lymphadenopathy:    He has no cervical adenopathy.  Neurological: He is oriented to  person, place, and time. He exhibits normal muscle tone. Coordination normal.  Skin: No rash noted. No erythema.  Psychiatric: He has a normal mood and affect. His behavior is normal.  Nursing note and vitals reviewed.   ED Course  Procedures   DIAGNOSTIC STUDIES: Oxygen Saturation is 98% on RA, normal by my interpretation.    COORDINATION OF CARE: 12:15 PM Discussed treatment plan with pt which includes lab work and a chest x-ray. Pt agreed to plan.   Labs Review Labs Reviewed  CBC WITH DIFFERENTIAL/PLATELET  BASIC METABOLIC PANEL  TROPONIN I   Imaging Review No results found.    EKG Interpretation   Date/Time:  Wednesday November 12 2014 11:41:36 EDT Ventricular Rate:  60 PR Interval:    QRS Duration: 179 QT Interval:  470 QTC  Calculation: 470 R Axis:   -81 Text Interpretation:  Atrial fibrillation Nonspecific IVCD with LAD LVH  with secondary repolarization abnormality Confirmed by Xandrea Clarey  MD, Demya Scruggs  [phone]) on 11/12/2014 12:12:32 PM      MDM   Final diagnoses:  None  labs and ekg unremarkable,  Chest pain most likely 2nd to stress of recent death of family member.  Pt to follow up with pcp or cardiologist  The chart was scribed for me under my direct supervision.  I personally performed the history, physical, and medical decision making and all procedures in the evaluation of this patient.Jay Ferguson, MD 11/12/14 1346  Jay Ferguson, MD 11/12/14 1346

## 2014-11-12 NOTE — ED Notes (Signed)
Pt's wife passed away upstairs this morning and pt had sudden onset of pain in chest.  Described as pressure.  Reports pain lasted approx 15 min.  Denies any sob, n/v.  Denies pain or pressure at this time.

## 2014-11-24 ENCOUNTER — Encounter: Payer: Self-pay | Admitting: Internal Medicine

## 2014-11-24 ENCOUNTER — Ambulatory Visit (INDEPENDENT_AMBULATORY_CARE_PROVIDER_SITE_OTHER): Payer: Medicare Other | Admitting: *Deleted

## 2014-11-24 DIAGNOSIS — I442 Atrioventricular block, complete: Secondary | ICD-10-CM | POA: Diagnosis not present

## 2014-11-24 NOTE — Progress Notes (Signed)
Remote pacemaker transmission.   

## 2014-12-01 LAB — CUP PACEART REMOTE DEVICE CHECK
Battery Remaining Longevity: 143 mo
Brady Statistic RV Percent Paced: 99 %
Date Time Interrogation Session: 20160912060015
Lead Channel Impedance Value: 630 Ohm
Lead Channel Sensing Intrinsic Amplitude: 12 mV
Lead Channel Setting Pacing Amplitude: 1.25 V
MDC IDC MSMT BATTERY REMAINING PERCENTAGE: 95.5 %
MDC IDC MSMT BATTERY VOLTAGE: 3.02 V
MDC IDC MSMT LEADCHNL RV PACING THRESHOLD AMPLITUDE: 1 V
MDC IDC MSMT LEADCHNL RV PACING THRESHOLD PULSEWIDTH: 0.4 ms
MDC IDC SET LEADCHNL RV PACING PULSEWIDTH: 0.4 ms
MDC IDC SET LEADCHNL RV SENSING SENSITIVITY: 6 mV
Pulse Gen Model: 1240
Pulse Gen Serial Number: 7488472

## 2014-12-09 ENCOUNTER — Encounter: Payer: Self-pay | Admitting: Cardiology

## 2015-04-17 ENCOUNTER — Encounter: Payer: Self-pay | Admitting: Internal Medicine

## 2015-04-17 ENCOUNTER — Ambulatory Visit (INDEPENDENT_AMBULATORY_CARE_PROVIDER_SITE_OTHER): Payer: Medicare Other | Admitting: Internal Medicine

## 2015-04-17 VITALS — BP 138/70 | HR 63 | Ht 72.0 in | Wt 209.8 lb

## 2015-04-17 DIAGNOSIS — I482 Chronic atrial fibrillation: Secondary | ICD-10-CM

## 2015-04-17 DIAGNOSIS — I442 Atrioventricular block, complete: Secondary | ICD-10-CM

## 2015-04-17 DIAGNOSIS — I4821 Permanent atrial fibrillation: Secondary | ICD-10-CM

## 2015-04-17 LAB — CUP PACEART INCLINIC DEVICE CHECK
Lead Channel Impedance Value: 612.5 Ohm
Lead Channel Pacing Threshold Amplitude: 1.375 V
Lead Channel Pacing Threshold Pulse Width: 0.4 ms
Lead Channel Sensing Intrinsic Amplitude: 12 mV
Lead Channel Setting Pacing Amplitude: 1.625
MDC IDC LEAD IMPLANT DT: 20020215
MDC IDC LEAD LOCATION: 753860
MDC IDC MSMT BATTERY REMAINING LONGEVITY: 135.6
MDC IDC MSMT BATTERY VOLTAGE: 3.02 V
MDC IDC SESS DTM: 20170203140931
MDC IDC SET LEADCHNL RV PACING PULSEWIDTH: 0.4 ms
MDC IDC SET LEADCHNL RV SENSING SENSITIVITY: 6 mV
MDC IDC STAT BRADY RV PERCENT PACED: 99.99 %
Pulse Gen Model: 1240
Pulse Gen Serial Number: 7488472

## 2015-04-17 NOTE — Patient Instructions (Signed)
Your physician recommends that you continue on your current medications as directed. Please refer to the Current Medication list given to you today. Device check on 07/20/15. Your physician recommends that you schedule a follow-up appointment in: 1 year with Dr. Rayann Heman.

## 2015-04-17 NOTE — Progress Notes (Signed)
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  Since his last visit, the patient reports doing very well.  His primary concern is with hearing loss in his R ear.  He is planning to have cochlear implant by Dr Thornell Mule soon.  Today, he denies symptoms of palpitations, chest pain, shortness of breath,  lower extremity edema, dizziness, presyncope, or syncope.  The patient is otherwise without complaint today.   Past Medical History  Diagnosis Date  . Coronary artery disease     Mild nonobstructive 1/12  . Atrial fibrillation ([place])     Permanent  . S/P AV nodal ablation     s/p SJM PPM; gen change 02-01-13 by Dr Rayann Heman  . Warfarin anticoagulation   . Hiatal hernia    Past Surgical History  Procedure Laterality Date  . Pacemaker placement  2002; 2014    SJM implanted by Dr Lovena Le with AV nodal ablation performed; gen change to [employer] SR RF by Dr Rayann Heman 02-01-13  . Pacemaker generator change Bilateral 02/01/2013    Procedure: PACEMAKER GENERATOR CHANGE;  Surgeon: Coralyn Mark, MD;  Location: [facility] CATH LAB;  Service: Cardiovascular;  Laterality: Bilateral;    Current Outpatient Prescriptions  Medication Sig Dispense Refill  . allopurinol (ZYLOPRIM) 100 MG tablet Take 100 mg by mouth Daily.     . clonazePAM (KLONOPIN) 0.5 MG tablet Take 0.5 mg by mouth daily.     Marland Kitchen COLCRYS 0.6 MG tablet Take 0.6 mg by mouth as needed.     . fish oil-omega-3 fatty acids 1000 MG capsule Take 1 g by mouth 2 (two) times daily.      Marland Kitchen HUMULIN 70/30 (70-30) 100 UNIT/ML injection Inject 30 Units into the skin 2 (two) times daily with a meal.     . isosorbide mononitrate (IMDUR) 60 MG 24 hr tablet Take 120 mg by mouth daily.    . metFORMIN (GLUCOPHAGE) 1000 MG tablet Take 1,000 mg by mouth 2 (two) times daily with a meal.      . metoprolol succinate (TOPROL-XL) 25 MG 24 hr tablet Take 0.5 tablets by mouth Daily.     . nitroGLYCERIN (NITROSTAT) 0.4 MG SL tablet Place 1 tablet (0.4 mg total) under the tongue every 5 (five) minutes x 3 doses as needed. 25 tablet 3  . ONE TOUCH ULTRA TEST test strip     . pantoprazole (PROTONIX) 40 MG tablet Take 1 tab by mouth 30 min prior to breakfast daily 30 tablet 3  . ramipril (ALTACE) 5 MG capsule Take 5 mg by mouth daily.      . sertraline (ZOLOFT) 50 MG tablet Take 1/2 tab daily    . simvastatin (ZOCOR) 40 MG tablet Take 40 mg by mouth at bedtime.      Marland Kitchen warfarin (COUMADIN) 5 MG tablet Take 5-7.5 mg by mouth daily. 7.5 mg on Monday, Wednesday, and Friday and 5 mg all other days (MANAGED BY PMD)     No current facility-administered medications for this visit.    Physical Exam: Filed Vitals:   04/17/15 0958  BP: 138/70  Pulse: 63  Height: 6' (1.829 m)  Weight: 209 lb 12.8 oz (95.165 kg)  SpO2: 98%    GEN- The patient is well appearing, alert and oriented x 3 today.   Head- normocephalic, atraumatic Eyes-  Sclera clear, conjunctiva pink Ears- deaf in right ear Oropharynx- clear Neck- supple,  Lungs-  Clear to ausculation bilaterally, normal work of breathing Chest- pacemaker pocket is without hematoma/ bruit Heart- Regular rate and rhythm (paced) GI- soft, NT, ND, + BS Extremities- no clubbing, cyanosis, or edema Neuro- strength and sensation are intact  Pacemaker interrogation- reviewed in detail today,  See paper chart    Pacemaker interrogation- reviewed in detail today,  See PACEART report  Assessment and Plan:  1. Complete heart block Normal pacemaker function See Pace Art report No changes today  2. Permanent afib Continue long term anticoagluation  Merlin Return to see me in [place] in 1 year  Thompson Grayer MD, [facility] 04/17/2015 10:49 AM

## 2015-07-20 ENCOUNTER — Ambulatory Visit (INDEPENDENT_AMBULATORY_CARE_PROVIDER_SITE_OTHER): Payer: Medicare Other | Admitting: *Deleted

## 2015-07-20 DIAGNOSIS — I442 Atrioventricular block, complete: Secondary | ICD-10-CM | POA: Diagnosis not present

## 2015-07-21 NOTE — Progress Notes (Signed)
Remote pacemaker transmission.   

## 2015-08-19 ENCOUNTER — Ambulatory Visit: Payer: Medicare Other | Admitting: Cardiology

## 2015-08-20 ENCOUNTER — Encounter: Payer: Self-pay | Admitting: Cardiology

## 2015-08-20 ENCOUNTER — Ambulatory Visit (INDEPENDENT_AMBULATORY_CARE_PROVIDER_SITE_OTHER): Payer: Medicare Other | Admitting: Cardiology

## 2015-08-20 VITALS — BP 138/74 | HR 62 | Ht 72.0 in | Wt 208.8 lb

## 2015-08-20 DIAGNOSIS — I2510 Atherosclerotic heart disease of native coronary artery without angina pectoris: Secondary | ICD-10-CM | POA: Diagnosis not present

## 2015-08-20 DIAGNOSIS — I482 Chronic atrial fibrillation: Secondary | ICD-10-CM | POA: Diagnosis not present

## 2015-08-20 DIAGNOSIS — I4821 Permanent atrial fibrillation: Secondary | ICD-10-CM

## 2015-08-20 DIAGNOSIS — Z950 Presence of cardiac pacemaker: Secondary | ICD-10-CM

## 2015-08-20 NOTE — Progress Notes (Signed)
Cardiology Office Note  Date: 08/20/2015   ID: [name], DOB 18-Mar-1945, MRN [id number]  PCP: Monico Blitz, MD  Primary Cardiologist: Rozann Lesches, MD   Chief Complaint  Patient presents with  . Atrial Fibrillation    History of Present Illness: Jay Campbell is a 70 y.o. male last seen in June 2016. He is here today with his wife for a follow-up visit. Reports no major change in cardiac status, no chest pain or palpitations.  He continues on Coumadin, followed by Dr. Manuella Ghazi. No reported major bleeding episodes.  Maintains follow-up in the device clinic with Dr. Rayann Heman, [place] pacemaker in place.  He did have a right sided cochlear implant since I saw him. Reports much improved hearing.  Past Medical History  Diagnosis Date  . Coronary artery disease     Mild nonobstructive 1/12  . Atrial fibrillation ([place])     Permanent  . S/P AV nodal ablation     s/p SJM PPM; gen change 02-01-13 by Dr Rayann Heman  . Warfarin anticoagulation   . Hiatal hernia     Past Surgical History  Procedure Laterality Date  . Pacemaker placement  2002; 2014    SJM implanted by Dr Lovena Le with AV nodal ablation performed; gen change to [employer] SR RF by Dr Rayann Heman 02-01-13  . Pacemaker generator change Bilateral 02/01/2013    Procedure: PACEMAKER GENERATOR CHANGE;  Surgeon: Coralyn Mark, MD;  Location: [facility] CATH LAB;  Service: Cardiovascular;  Laterality: Bilateral;    Current Outpatient Prescriptions  Medication Sig Dispense Refill  . allopurinol (ZYLOPRIM) 100 MG tablet Take 100 mg by mouth Daily.     . clonazePAM (KLONOPIN) 0.5 MG tablet Take 0.5 mg by mouth daily.     Marland Kitchen COLCRYS 0.6 MG tablet Take 0.6 mg by mouth as needed.     . fish oil-omega-3 fatty acids 1000 MG capsule Take 1 g by mouth 2 (two) times daily.      Marland Kitchen HUMULIN 70/30 (70-30) 100 UNIT/ML injection 20 units every morning & 30 units every evening    . isosorbide mononitrate (IMDUR) 60 MG 24 hr tablet Take 120 mg by mouth  daily.    . metFORMIN (GLUCOPHAGE) 1000 MG tablet Take 1,000 mg by mouth 2 (two) times daily with a meal.      . metoprolol succinate (TOPROL-XL) 25 MG 24 hr tablet Take 0.5 tablets by mouth Daily.    . nitroGLYCERIN (NITROSTAT) 0.4 MG SL tablet Place 1 tablet (0.4 mg total) under the tongue every 5 (five) minutes x 3 doses as needed. 25 tablet 3  . ONE TOUCH ULTRA TEST test strip     . pantoprazole (PROTONIX) 40 MG tablet Take 1 tab by mouth 30 min prior to breakfast daily 30 tablet 3  . ramipril (ALTACE) 5 MG capsule Take 5 mg by mouth daily.      . sertraline (ZOLOFT) 50 MG tablet Take 1/2 tab daily    . simvastatin (ZOCOR) 40 MG tablet Take 40 mg by mouth at bedtime.      Marland Kitchen warfarin (COUMADIN) 5 MG tablet Take 5-7.5 mg by mouth daily. 7.5 mg on Monday, Wednesday, and Friday and 5 mg all other days (MANAGED BY PMD)     No current facility-administered medications for this visit.   Allergies:  Contrast media; Shellfish allergy; and Sulfonamide derivatives   Social History: The patient  reports that he has never smoked. He has never used smokeless tobacco. He reports that  he does not drink alcohol or use illicit drugs.   ROS:  Please see the history of present illness. Otherwise, complete review of systems is positive for none.  All other systems are reviewed and negative.   Physical Exam: VS:  BP 138/74 mmHg  Pulse 62  Ht 6' (1.829 m)  Wt 208 lb 12.8 oz (94.711 kg)  BMI 28.31 kg/m2  SpO2 98%, BMI Body mass index is 28.31 kg/(m^2).  Wt Readings from Last 3 Encounters:  08/20/15 208 lb 12.8 oz (94.711 kg)  04/17/15 209 lb 12.8 oz (95.165 kg)  08/13/14 204 lb (92.534 kg)    No acute distress.  HEENT: Conjunctiva and lids normal, oropharynx clear.  Neck: Supple, no elevated JVP or carotid bruits, no thyromegaly.  Lungs: Clear to auscultation, nonlabored breathing at rest.  Cardiac: Regular rate and rhythm, no S3 or significant systolic murmur, no pericardial rub.  Abdomen:  Soft, bowel sounds present.  Extremities: No pitting edema, distal pulses 2+.  ECG: I personally reviewed the tracing from 11/12/2014 which showed atrial fibrillation with ventricular pacing.  Recent Labwork: 11/12/2014: BUN 22*; Creatinine, Ser 1.14; Hemoglobin 13.2; Platelets 102*; Potassium 4.1; Sodium 136   Other Studies Reviewed Today:  Lexiscan Cardiolite 03/26/2014: FINDINGS: Stress/ECG data: The patient was stressed according to the Lexi scan protocol. The heart rate remain in the 60 beats per min range. The blood pressure averaged 120/60. No chest pain was reported.  ECG demonstrated a paced ventricular rhythm with underlying atrial fibrillation. There were no changes with stress.  Perfusion: No perfusion defects to suggest ischemia or infarction.  Wall Motion: Normal regional wall motion. No left ventricular dilation.  Left Ventricular Ejection Fraction: 64 %  End diastolic volume 0000000 ml  End systolic volume 45 ml  IMPRESSION: 1. No evidence of myocardial ischemia or scar.  2. Normal regional wall motion.  3. Left ventricular ejection fraction 64%  4. Low-risk stress test findings*.  Assessment and Plan:  1. Chronic atrial fibrillation. Continue Coumadin for stroke prophylaxis. He is a symptomatic in terms of palpitations at this time.  2. AV node ablation with [employer] pacemaker in place. Keep follow-up with Dr. Rayann Heman and remote device transmissions.  3. Symptomatically stable nonobstructive CAD. Follow-up stress testing from last year was low risk.  Current medicines were reviewed with the patient today.  Disposition: FU with me in 1 year.   Signed, Satira Sark, MD, [facility] 08/20/2015 3:31 PM    [facility] [address], [place], [place] [address] Phone: [phone]; Fax: [phone]

## 2015-08-20 NOTE — Patient Instructions (Signed)

## 2015-08-25 LAB — CUP PACEART REMOTE DEVICE CHECK
Battery Remaining Longevity: 140 mo
Battery Remaining Percentage: 95.5 %
Battery Voltage: 3.01 V
Brady Statistic RV Percent Paced: 99 %
Implantable Lead Implant Date: 20020215
Implantable Lead Location: 753860
Lead Channel Impedance Value: 610 Ohm
Lead Channel Pacing Threshold Pulse Width: 0.4 ms
Lead Channel Setting Pacing Pulse Width: 0.4 ms
MDC IDC MSMT LEADCHNL RV PACING THRESHOLD AMPLITUDE: 1.25 V
MDC IDC MSMT LEADCHNL RV SENSING INTR AMPL: 12 mV
MDC IDC PG SERIAL: 7488472
MDC IDC SESS DTM: 20170508060015
MDC IDC SET LEADCHNL RV PACING AMPLITUDE: 1.5 V
MDC IDC SET LEADCHNL RV SENSING SENSITIVITY: 6 mV

## 2015-08-26 ENCOUNTER — Encounter: Payer: Self-pay | Admitting: Cardiology

## 2015-10-19 ENCOUNTER — Ambulatory Visit (INDEPENDENT_AMBULATORY_CARE_PROVIDER_SITE_OTHER): Payer: Medicare Other | Admitting: *Deleted

## 2015-10-19 DIAGNOSIS — I442 Atrioventricular block, complete: Secondary | ICD-10-CM

## 2015-10-19 DIAGNOSIS — Z950 Presence of cardiac pacemaker: Secondary | ICD-10-CM | POA: Diagnosis not present

## 2015-10-19 DIAGNOSIS — I4821 Permanent atrial fibrillation: Secondary | ICD-10-CM

## 2015-10-19 NOTE — Progress Notes (Signed)
Remote pacemaker transmission.   

## 2015-10-21 ENCOUNTER — Encounter: Payer: Self-pay | Admitting: Cardiology

## 2015-10-23 LAB — CUP PACEART REMOTE DEVICE CHECK
Battery Voltage: 3.01 V
Date Time Interrogation Session: 20170807062103
Implantable Lead Implant Date: 20020215
Lead Channel Impedance Value: 610 Ohm
Lead Channel Pacing Threshold Pulse Width: 0.4 ms
Lead Channel Sensing Intrinsic Amplitude: 12 mV
MDC IDC LEAD LOCATION: 753860
MDC IDC MSMT BATTERY REMAINING LONGEVITY: 143 mo
MDC IDC MSMT BATTERY REMAINING PERCENTAGE: 95.5 %
MDC IDC MSMT LEADCHNL RV PACING THRESHOLD AMPLITUDE: 1 V
MDC IDC SET LEADCHNL RV PACING AMPLITUDE: 1.25 V
MDC IDC SET LEADCHNL RV PACING PULSEWIDTH: 0.4 ms
MDC IDC SET LEADCHNL RV SENSING SENSITIVITY: 6 mV
MDC IDC STAT BRADY RV PERCENT PACED: 99 %
Pulse Gen Model: 1240
Pulse Gen Serial Number: 7488472

## 2015-12-14 ENCOUNTER — Ambulatory Visit (HOSPITAL_COMMUNITY)
Admission: RE | Admit: 2015-12-14 | Discharge: 2015-12-14 | Disposition: A | Discharge status: Home / Self Care | Payer: Medicare Other | Source: Ambulatory Visit | Attending: Ophthalmology | Admitting: Ophthalmology

## 2015-12-14 ENCOUNTER — Encounter (HOSPITAL_COMMUNITY)
Admission: RE | Disposition: A | Discharge status: Home / Self Care | Payer: Self-pay | Source: Ambulatory Visit | Attending: Ophthalmology

## 2015-12-14 DIAGNOSIS — H409 Unspecified glaucoma: Secondary | ICD-10-CM | POA: Diagnosis not present

## 2015-12-14 DIAGNOSIS — Z882 Allergy status to sulfonamides status: Secondary | ICD-10-CM | POA: Diagnosis not present

## 2015-12-14 DIAGNOSIS — Z91041 Radiographic dye allergy status: Secondary | ICD-10-CM | POA: Diagnosis not present

## 2015-12-14 DIAGNOSIS — Z91013 Allergy to seafood: Secondary | ICD-10-CM | POA: Insufficient documentation

## 2015-12-14 HISTORY — PX: SLT LASER APPLICATION: SHX6099

## 2015-12-14 SURGERY — SLT LASER APPLICATION
Anesthesia: LOCAL | Laterality: Left

## 2015-12-14 MED ORDER — APRACLONIDINE HCL 1 % OP SOLN
1.0000 [drp] | OPHTHALMIC | Status: AC
  Administered 2015-12-14 (×3): 1 [drp] via OPHTHALMIC

## 2015-12-14 MED ORDER — APRACLONIDINE HCL 1 % OP SOLN
OPHTHALMIC | Status: AC
  Filled 2015-12-14: qty 0.1

## 2015-12-14 MED ORDER — TETRACAINE HCL 0.5 % OP SOLN
1.0000 [drp] | Freq: Once | OPHTHALMIC | Status: AC
  Administered 2015-12-14: 1 [drp] via OPHTHALMIC

## 2015-12-14 MED ORDER — PILOCARPINE HCL 1 % OP SOLN
OPHTHALMIC | Status: AC
  Filled 2015-12-14: qty 15

## 2015-12-14 MED ORDER — TETRACAINE HCL 0.5 % OP SOLN
OPHTHALMIC | Status: AC
  Filled 2015-12-14: qty 4

## 2015-12-14 MED ORDER — PILOCARPINE HCL 1 % OP SOLN
2.0000 [drp] | Freq: Once | OPHTHALMIC | Status: AC
  Administered 2015-12-14: 2 [drp] via OPHTHALMIC

## 2015-12-14 NOTE — Brief Op Note (Signed)
Jay Campbell 12/14/2015  Williams Che, MD  Pre-op Diagnosis:  uncontrolled glaucoma OS  Post-op Diagnosis:  same  Yag laser self-test completed: Yes.    Indications:  See scanned office H&P for details  Procedure: SLT   left eye  Eye protection worn by staff:  Yes.   Laser In Use sign on door:  Yes.    Laser:  LUMENIS YAG/SLT LASER  Power Setting:  0.9 mJ/burst Anatomical site treated:  Trabecular meshwork OS Number of applications:  94 Total energy delivered: 85 mJ Results:  Treated to bubble formation  The patient was discharged home with instructions to continue all his current glaucoma medications in the un-operated eye, and discontinue all his current glaucoma medications, if any.  Patient was instructed to go to the office, as previously scheduled, for intraocular pressure:  Yes.    Patient verbalizes understanding of discharge instructions:  Yes.    Notes:  Gomoscopy:  Grade:   2 to 4 open     Pigmentation:  moderate    Synechiae:  none    Angle ressessions : none    Other:  Pt tolerated procedure well, no complications.

## 2015-12-14 NOTE — Discharge Instructions (Signed)
Jay Campbell  12/14/2015     Instructions    Activity: No Restrictions.   Diet: Resume Diet you were on at home.   Pain Medication: Tylenol if Needed.   CONTACT YOUR DOCTOR IF YOU HAVE PAIN, REDNESS IN YOUR EYE, OR DECREASED VISION.   Follow-up:in 3 weeks with Williams Che, MD.   Dr. Gershon Crane: [phone]  Dr. Iona HansenJI:[id number]  Dr. Geoffry ParadiseID:[id number]   If you find that you cannot contact your physician, but feel that your signs and   Symptoms warrant a physician's attention, call the Emergency Room at   [phone] [phone].   Othern/a.  FOLLOW UP WITH DR HAINES 01/05/2016 @ 11:15 AM IN HIS OFFICE.

## 2015-12-14 NOTE — H&P (Signed)
I have reviewed the pre printed H&P, the patient was re-examined, and I have identified no significant interval changes in the patient's medical condition.  There is no change in the plan of care since the history and physical of record. 

## 2015-12-22 ENCOUNTER — Encounter (HOSPITAL_COMMUNITY): Payer: Self-pay | Admitting: Ophthalmology

## 2016-01-18 ENCOUNTER — Ambulatory Visit (INDEPENDENT_AMBULATORY_CARE_PROVIDER_SITE_OTHER): Payer: Medicare Other | Admitting: *Deleted

## 2016-01-18 DIAGNOSIS — I442 Atrioventricular block, complete: Secondary | ICD-10-CM

## 2016-01-19 NOTE — Progress Notes (Signed)
Remote pacemaker transmission.   

## 2016-01-20 ENCOUNTER — Encounter: Payer: Self-pay | Admitting: Cardiology

## 2016-01-31 LAB — CUP PACEART REMOTE DEVICE CHECK
Battery Remaining Percentage: 95.5 %
Battery Voltage: 3.01 V
Date Time Interrogation Session: 20171106070015
Implantable Pulse Generator Implant Date: 20141121
Lead Channel Pacing Threshold Pulse Width: 0.4 ms
Lead Channel Setting Pacing Amplitude: 1.75 V
Lead Channel Setting Pacing Pulse Width: 0.4 ms
MDC IDC LEAD IMPLANT DT: 20020215
MDC IDC LEAD LOCATION: 753860
MDC IDC MSMT BATTERY REMAINING LONGEVITY: 142 mo
MDC IDC MSMT LEADCHNL RV IMPEDANCE VALUE: 610 Ohm
MDC IDC MSMT LEADCHNL RV PACING THRESHOLD AMPLITUDE: 1.5 V
MDC IDC MSMT LEADCHNL RV SENSING INTR AMPL: 12 mV
MDC IDC PG SERIAL: 7488472
MDC IDC SET LEADCHNL RV SENSING SENSITIVITY: 6 mV
MDC IDC STAT BRADY RV PERCENT PACED: 99 %
Pulse Gen Model: 1240

## 2016-04-15 ENCOUNTER — Encounter: Payer: Self-pay | Admitting: Internal Medicine

## 2016-04-15 ENCOUNTER — Ambulatory Visit (INDEPENDENT_AMBULATORY_CARE_PROVIDER_SITE_OTHER): Payer: Medicare Other | Admitting: Internal Medicine

## 2016-04-15 VITALS — BP 139/76 | HR 79 | Ht 72.0 in | Wt 202.0 lb

## 2016-04-15 DIAGNOSIS — I442 Atrioventricular block, complete: Secondary | ICD-10-CM | POA: Diagnosis not present

## 2016-04-15 DIAGNOSIS — I482 Chronic atrial fibrillation: Secondary | ICD-10-CM

## 2016-04-15 DIAGNOSIS — I4821 Permanent atrial fibrillation: Secondary | ICD-10-CM

## 2016-04-15 MED ORDER — NITROGLYCERIN 0.4 MG SL SUBL: 0.4000 mg | SUBLINGUAL_TABLET | SUBLINGUAL | 3 refills | Status: DC | PRN

## 2016-04-15 NOTE — Patient Instructions (Signed)
Medication Instructions:  Continue all current medications.  Labwork: none  Testing/Procedures: none  Follow-Up: Your physician wants you to follow up in:  1 year.  You will receive a reminder letter in the mail one-two months in advance.  If you don't receive a letter, please call our office to schedule the follow up appointment   Any Other Special Instructions Will Be Listed Below (If Applicable). Remote monitoring is used to monitor your Pacemaker of ICD from home. This monitoring reduces the number of office visits required to check your device to one time per year. It allows us to keep an eye on the functioning of your device to ensure it is working properly. You are scheduled for a device check from home on 07/18/2016.  You may send your transmission at any time that day. If you have a wireless device, the transmission will be sent automatically. After your physician reviews your transmission, you will receive a postcard with your next transmission date.  If you need a refill on your cardiac medications before your next appointment, please call your pharmacy.  

## 2016-04-15 NOTE — Progress Notes (Signed)
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 male who presents today for routine electrophysiology followup.  Since his last visit, the patient reports doing very well.  His primary concern is with hearing loss in his R ear.  He did have cochlear implant by Dr Thornell Mule but continues to have some issues.  He has stable dizziness.    Today, he denies symptoms of palpitations, chest pain, shortness of breath,  lower extremity edema,  presyncope, or syncope.  The patient is otherwise without complaint today.   Past Medical History:  Diagnosis Date  . Atrial fibrillation ([place])    Permanent  . Coronary artery disease    Mild nonobstructive 1/12  . Hiatal hernia   . S/P AV nodal ablation    s/p SJM PPM; gen change 02-01-13 by Dr Rayann Heman  . Warfarin anticoagulation    Past Surgical History:  Procedure Laterality Date  . PACEMAKER GENERATOR CHANGE Bilateral 02/01/2013   Procedure: PACEMAKER GENERATOR CHANGE;  Surgeon: Coralyn Mark, MD;  Location: [facility];  Service: Cardiovascular;  Laterality: Bilateral;  . PACEMAKER PLACEMENT  2002; 2014   SJM implanted by Dr Lovena Le with AV nodal ablation performed; gen change to [employer] SR RF by Dr Rayann Heman 02-01-13  . SLT LASER APPLICATION Left [id number]   Procedure: SLT LASER APPLICATION;  Surgeon: Williams Che, MD;  Location: [facility];  Service: Ophthalmology;  Laterality: Left;    Current Outpatient Prescriptions  Medication Sig Dispense Refill  . allopurinol (ZYLOPRIM) 100 MG tablet Take 100 mg by mouth Daily.     . clonazePAM (KLONOPIN) 0.5 MG tablet Take 0.5 mg by mouth daily.     Marland Kitchen COLCRYS 0.6 MG tablet Take 0.6 mg by mouth as needed.     . fish oil-omega-3 fatty acids 1000 MG capsule Take 1 g by mouth 2 (two) times daily.      Marland Kitchen HUMULIN 70/30 (70-30) 100 UNIT/ML injection 20 units every morning & 30 units every evening    . isosorbide mononitrate (IMDUR) 60 MG 24 hr tablet Take 120 mg by mouth daily.     . meclizine (ANTIVERT) 25 MG tablet     . metFORMIN (GLUCOPHAGE) 1000 MG tablet Take 1,000 mg by mouth 2 (two) times daily with a meal.      . metoprolol succinate (TOPROL-XL) 25 MG 24 hr tablet Take 0.5 tablets by mouth Daily.    . nitroGLYCERIN (NITROSTAT) 0.4 MG SL tablet Place 1 tablet (0.4 mg total) under the tongue every 5 (five) minutes x 3 doses as needed. 25 tablet 3  . pantoprazole (PROTONIX) 40 MG tablet Take 1 tab by mouth 30 min prior to breakfast daily 30 tablet 3  . ramipril (ALTACE) 5 MG capsule Take 5 mg by mouth daily.      . sertraline (ZOLOFT) 50 MG tablet Take 1/2 tab daily    . simvastatin (ZOCOR) 40 MG tablet Take 40 mg by mouth at bedtime.      Marland Kitchen warfarin (COUMADIN) 5 MG tablet Take 5-7.5 mg by mouth daily. 7.5 mg on Monday, Wednesday, and Friday and 5 mg all other days (MANAGED BY PMD)     No current facility-administered medications for this visit.     Physical Exam: Vitals:   04/15/16 1018  BP: 139/76  Pulse: 79  SpO2: 99%  Weight: 202 lb (91.6 kg)  Height: 6' (1.829 m)    GEN- The patient is well appearing, alert  and oriented x 3 today.   Head- normocephalic, atraumatic Eyes-  Sclera clear, conjunctiva pink Ears- cochlear implant Oropharynx- clear Neck- supple,  Lungs- Clear to ausculation bilaterally, normal work of breathing Chest- pacemaker pocket is without hematoma/ bruit Heart- Regular rate and rhythm (paced) GI- soft, NT, ND, + BS Extremities- no clubbing, cyanosis, or edema Neuro- strength and sensation are intact   Pacemaker interrogation- reviewed in detail today,  See PACEART report  Assessment and Plan:  1. Complete heart block Normal pacemaker function See Pace Art report No changes today  2. Permanent afib Continue long term anticoagluation  Merlin Return to see me in [place] in 1 year  Thompson Grayer MD, [facility] 04/15/2016 10:45 AM

## 2016-04-15 NOTE — Addendum Note (Signed)
Addended by: Laurine Blazer on: 04/15/2016 10:57 AM   Modules accepted: Orders

## 2016-04-20 LAB — CUP PACEART INCLINIC DEVICE CHECK
Battery Voltage: 3.01 V
Brady Statistic RV Percent Paced: 99.97 %
Implantable Lead Implant Date: 20020215
Implantable Lead Location: 753860
Lead Channel Pacing Threshold Amplitude: 1 V
Lead Channel Pacing Threshold Pulse Width: 0.4 ms
Lead Channel Setting Sensing Sensitivity: 6 mV
MDC IDC MSMT LEADCHNL RV IMPEDANCE VALUE: 637.5 Ohm
MDC IDC MSMT LEADCHNL RV SENSING INTR AMPL: 12 mV
MDC IDC PG IMPLANT DT: 20141121
MDC IDC PG SERIAL: 7488472
MDC IDC SESS DTM: 20180202164039
MDC IDC SET LEADCHNL RV PACING AMPLITUDE: 1.25 V
MDC IDC SET LEADCHNL RV PACING PULSEWIDTH: 0.4 ms

## 2016-07-18 ENCOUNTER — Ambulatory Visit (INDEPENDENT_AMBULATORY_CARE_PROVIDER_SITE_OTHER): Payer: Medicare Other | Admitting: *Deleted

## 2016-07-18 DIAGNOSIS — I442 Atrioventricular block, complete: Secondary | ICD-10-CM | POA: Diagnosis not present

## 2016-07-19 NOTE — Progress Notes (Signed)
Remote pacemaker transmission.   

## 2016-07-20 LAB — CUP PACEART REMOTE DEVICE CHECK
Battery Remaining Longevity: 140 mo
Battery Remaining Percentage: 95.5 %
Battery Voltage: 3.01 V
Implantable Lead Implant Date: 20020215
Implantable Lead Location: 753860
Implantable Pulse Generator Implant Date: 20141121
Lead Channel Pacing Threshold Amplitude: 1.5 V
Lead Channel Pacing Threshold Pulse Width: 0.4 ms
Lead Channel Sensing Intrinsic Amplitude: 12 mV
Lead Channel Setting Sensing Sensitivity: 6 mV
MDC IDC MSMT LEADCHNL RV IMPEDANCE VALUE: 600 Ohm
MDC IDC PG SERIAL: 7488472
MDC IDC SESS DTM: 20180507083107
MDC IDC SET LEADCHNL RV PACING AMPLITUDE: 1.75 V
MDC IDC SET LEADCHNL RV PACING PULSEWIDTH: 0.4 ms
MDC IDC STAT BRADY RV PERCENT PACED: 99 %
Pulse Gen Model: 1240

## 2016-07-22 ENCOUNTER — Encounter: Payer: Self-pay | Admitting: Cardiology

## 2016-08-25 ENCOUNTER — Encounter (INDEPENDENT_AMBULATORY_CARE_PROVIDER_SITE_OTHER): Payer: Medicare Other | Admitting: Ophthalmology

## 2016-08-25 DIAGNOSIS — H43813 Vitreous degeneration, bilateral: Secondary | ICD-10-CM

## 2016-08-25 DIAGNOSIS — H35033 Hypertensive retinopathy, bilateral: Secondary | ICD-10-CM | POA: Diagnosis not present

## 2016-08-25 DIAGNOSIS — H33302 Unspecified retinal break, left eye: Secondary | ICD-10-CM | POA: Diagnosis not present

## 2016-09-08 ENCOUNTER — Encounter (INDEPENDENT_AMBULATORY_CARE_PROVIDER_SITE_OTHER): Payer: Medicare Other | Admitting: Ophthalmology

## 2016-09-08 DIAGNOSIS — H33302 Unspecified retinal break, left eye: Secondary | ICD-10-CM

## 2016-10-17 ENCOUNTER — Ambulatory Visit (INDEPENDENT_AMBULATORY_CARE_PROVIDER_SITE_OTHER): Payer: Medicare Other | Admitting: *Deleted

## 2016-10-17 DIAGNOSIS — I442 Atrioventricular block, complete: Secondary | ICD-10-CM | POA: Diagnosis not present

## 2016-10-18 NOTE — Progress Notes (Signed)
Remote pacemaker transmission.   

## 2016-10-19 ENCOUNTER — Encounter: Payer: Self-pay | Admitting: Cardiology

## 2016-11-01 ENCOUNTER — Telehealth: Payer: Self-pay | Admitting: Gastroenterology

## 2016-11-01 NOTE — Telephone Encounter (Signed)
Okay to schedule soon with an APP per Dr Silverio Decamp. Thanks

## 2016-11-01 NOTE — Telephone Encounter (Signed)
Ok to schedule office visit to evaluate

## 2016-11-01 NOTE — Telephone Encounter (Signed)
Patient wife calling to state pt just saw pcp and they said pt had blood in his stool and needed a colon. Last colon looks like 2011 with Dr.Kaplan. Pt wife sees Dr.Nandigam and is requesting her. Please advise on scheduling next colon or ov.

## 2016-11-02 ENCOUNTER — Encounter: Payer: Self-pay | Admitting: Physician Assistant

## 2016-11-08 LAB — CUP PACEART REMOTE DEVICE CHECK
Battery Remaining Longevity: 143 mo
Battery Remaining Percentage: 95.5 %
Battery Voltage: 3.01 V
Brady Statistic RV Percent Paced: 99 %
Implantable Lead Implant Date: 20020215
Implantable Lead Location: 753860
Lead Channel Pacing Threshold Pulse Width: 0.4 ms
Lead Channel Sensing Intrinsic Amplitude: 12 mV
Lead Channel Setting Pacing Amplitude: 1.625
Lead Channel Setting Pacing Pulse Width: 0.4 ms
Lead Channel Setting Sensing Sensitivity: 6 mV
MDC IDC MSMT LEADCHNL RV IMPEDANCE VALUE: 610 Ohm
MDC IDC MSMT LEADCHNL RV PACING THRESHOLD AMPLITUDE: 1.375 V
MDC IDC PG IMPLANT DT: 20141121
MDC IDC PG SERIAL: 7488472
MDC IDC SESS DTM: 20180806071057

## 2016-11-15 ENCOUNTER — Ambulatory Visit (INDEPENDENT_AMBULATORY_CARE_PROVIDER_SITE_OTHER): Payer: Medicare Other | Admitting: Physician Assistant

## 2016-11-15 ENCOUNTER — Encounter: Payer: Self-pay | Admitting: Physician Assistant

## 2016-11-15 VITALS — BP 132/68 | HR 64 | Ht 70.25 in | Wt 203.4 lb

## 2016-11-15 DIAGNOSIS — G8929 Other chronic pain: Secondary | ICD-10-CM | POA: Diagnosis not present

## 2016-11-15 DIAGNOSIS — R1031 Right lower quadrant pain: Secondary | ICD-10-CM | POA: Diagnosis not present

## 2016-11-15 DIAGNOSIS — Z7901 Long term (current) use of anticoagulants: Secondary | ICD-10-CM

## 2016-11-15 DIAGNOSIS — K625 Hemorrhage of anus and rectum: Secondary | ICD-10-CM

## 2016-11-15 DIAGNOSIS — R1032 Left lower quadrant pain: Secondary | ICD-10-CM

## 2016-11-15 MED ORDER — NA SULFATE-K SULFATE-MG SULF 17.5-3.13-1.6 GM/177ML PO SOLN: 1.0000 | Freq: Once | ORAL | 0 refills | Status: AC

## 2016-11-15 NOTE — Progress Notes (Signed)
Subjective:    Patient ID: Jay Campbell, male    DOB: 1945-07-12, 71 y.o.   MRN: [id number]  HPI Jay Campbell is a pleasant 71 year old white male, previously known to Dr. Deatra Ina who was last seen in our office in 2016 who comes in today with complaints of bright red blood with bowel movements. Patient has history of coronary artery disease, atrial fibrillation for which she is on Coumadin, history of complete heart block status post AV node ablation and a snake or 2. He had EGD in March 2016 for complaints of dysphagia and had balloon dilation of distal esophageal stricture. Colonoscopy was done in 2011 [place] ,[place] per a Dr. Truddie Hidden , and was negative. Patient states that he has been noticing bright red blood with most of his bowel movements over the past several months. He sees blood on the tissue and occasionally in the commode. Stool appears brown and normal. He has no complaints of rectal discomfort. He does have occasional constipation alternating with loose stools. He apparently has been having some lower abdominal pain off and on over the past several months which seems worse before bowel movement and better after a bowel movement. Appetite has been fair her weight has been stable. He says occasionally he will have some "choking" with liquids if he drinks too fast but is not having any solid food dysphagia.  Review of Systems Pertinent positive and negative review of systems were noted in the above HPI section.  All other review of systems was otherwise negative.  Outpatient Encounter Prescriptions as of 11/15/2016  Medication Sig  . allopurinol (ZYLOPRIM) 100 MG tablet Take 100 mg by mouth Daily.   . clonazePAM (KLONOPIN) 0.5 MG tablet Take 0.5 mg by mouth daily.   Marland Kitchen COLCRYS 0.6 MG tablet Take 0.6 mg by mouth as needed.   . fish oil-omega-3 fatty acids 1000 MG capsule Take 1 g by mouth 2 (two) times daily.    Marland Kitchen HUMULIN 70/30 (70-30) 100 UNIT/ML injection 20 units every morning &  30 units every evening  . isosorbide mononitrate (IMDUR) 60 MG 24 hr tablet Take 120 mg by mouth daily.  . meclizine (ANTIVERT) 25 MG tablet Take 25 mg by mouth as needed.   . metFORMIN (GLUCOPHAGE) 1000 MG tablet Take 1,000 mg by mouth 2 (two) times daily with a meal.    . metoprolol succinate (TOPROL-XL) 25 MG 24 hr tablet Take 0.5 tablets by mouth Daily.  . ramipril (ALTACE) 5 MG capsule Take 5 mg by mouth daily.    . ranitidine (ZANTAC) 150 MG tablet Take 1 tablet by mouth 2 (two) times daily.  . sertraline (ZOLOFT) 50 MG tablet Take 1/2 tab daily  . simvastatin (ZOCOR) 40 MG tablet Take 40 mg by mouth at bedtime.    Marland Kitchen warfarin (COUMADIN) 5 MG tablet Take 5-7.5 mg by mouth daily. 7.5 mg on Monday, Wednesday, and Friday and 5 mg all other days (MANAGED BY PMD)  . Na Sulfate-K Sulfate-Mg Sulf 17.5-3.13-1.6 GM/180ML SOLN Take 1 kit by mouth once.  . nitroGLYCERIN (NITROSTAT) 0.4 MG SL tablet Place 1 tablet (0.4 mg total) under the tongue every 5 (five) minutes x 3 doses as needed. (Patient not taking: Reported on 11/15/2016)  . [DISCONTINUED] pantoprazole (PROTONIX) 40 MG tablet Take 1 tab by mouth 30 min prior to breakfast daily   No facility-administered encounter medications on file as of 11/15/2016.    Allergies  Allergen Reactions  . Contrast Media [Iodinated Diagnostic Agents]   .  Shellfish Allergy Itching and Rash  . Sulfonamide Derivatives Itching and Rash   Patient Active Problem List   Diagnosis Date Noted  . Precordial pain 03/20/2014  . Complete heart block ([place]) 02/01/2013  . S/P AV nodal ablation   . Warfarin anticoagulation   . CORONARY ATHEROSCLEROSIS NATIVE CORONARY ARTERY 02/01/2008  . Permanent atrial fibrillation ([place]) 02/01/2008  . Cardiac pacemaker in situ 02/01/2008   Social History   Social History  . Marital status: Married    Spouse name: N/A  . Number of children: N/A  . Years of education: N/A   Occupational History  . Not on file.   Social History  Main Topics  . Smoking status: Never Smoker  . Smokeless tobacco: Never Used  . Alcohol use No  . Drug use: No  . Sexual activity: Not on file   Other Topics Concern  . Not on file   Social History Narrative  . No narrative on file    Mr. Larmon family history includes COPD in his mother; Cancer - Lung in his father; Emphysema in his mother.      Objective:    Vitals:   11/15/16 1010  BP: 132/68  Pulse: 64    Physical Exam  well-developed older white male in no acute distress, hard of hearing, accompanied by his wife, both pleasant blood pressure 132/68 pulse 64, BMI 28.9. HEENT; nontraumatic normocephalic EOMI PERRLA sclera anicteric, Cardiovascular ;regular rate and rhythm with S1-S2 no murmur rub or gallop, Pacemaker in left chest wall, Pulmonary ;clear bilaterally, Abdomen ;soft, he has some mild tenderness in the suprapubic area no guarding or rebound no palpable mass or hepatosplenomegaly bowel sounds are present, Rectal ;exam not done, extremities no clubbing cyanosis or edema skin warm and dry, Neuropsych ;mood and affect appropriate       Assessment & Plan:   #24 71 year old white male with painless bright red blood per rectum intermittently over the past 3-4 months, associated with mild lower abdominal/suprapubic abdominal discomfort. Rule out occult colon lesion, rule out bleeding secondary to internal hemorrhoids #2 chronic anticoagulation-on Coumadin #3 history of coronary artery disease #4 history of complete heart block status post AV ablation and pacemaker #5 history of distal esophageal stricture status post dilation 2016  Plan; Patient will be scheduled for colonoscopy with Dr. Silverio Decamp (patient's wife sees Dr. Silverio Decamp and requests patient to be established with her as well). Procedure was discussed in detail with patient including risks and benefits and he is agreeable to proceed. We will communicate with his cardiologist Dr. Rayann Heman regarding plan to hold  Coumadin for 5 days prior to procedure to assure this is reasonable for this patient.. Check CBC today  Jay Brandel S Nandan Willems PA-C 11/15/2016   Cc: Monico Blitz, MD

## 2016-11-15 NOTE — Patient Instructions (Addendum)
You have been scheduled for a colonoscopy. Please follow written instructions given to you at your visit today.  Please pick up your prep supplies at the pharmacy within the next 1-3 days. [place]. If you use inhalers (even only as needed), please bring them with you on the day of your procedure. Your physician has requested that you go to www.startemmi.com and enter the access code given to you at your visit today. This web site gives a general overview about your procedure. However, you should still follow specific instructions given to you by our office regarding your preparation for the procedure.

## 2016-11-21 ENCOUNTER — Encounter: Payer: Self-pay | Admitting: Gastroenterology

## 2016-11-29 NOTE — Progress Notes (Signed)
Reviewed and agree with documentation and assessment and plan. K. Veena Mallissa Lorenzen , MD   

## 2016-11-30 ENCOUNTER — Telehealth: Payer: Self-pay | Admitting: *Deleted

## 2016-11-30 ENCOUNTER — Telehealth: Payer: Self-pay | Admitting: Internal Medicine

## 2016-11-30 NOTE — Telephone Encounter (Signed)
Got message from Katherine, [place]. The patient can hold his Coumadin until Monday 12-05-2016 after the procdure.  Patient informed.

## 2016-11-30 NOTE — Telephone Encounter (Signed)
Called [place], spoke to [facility] RN,  Who has seen the patient regarding his coumadin and PR/INR in the passed. Had EGD 2016 and was cleared at that time. She said that it is ok for the patient to hold his Coumadin  until after the colonoscopy on Monday, 12-05-2016.

## 2016-11-30 NOTE — Telephone Encounter (Signed)
11/30/2016   RE: KHADEN GATER DOB: [birth year] MRN: [id number]   Dear Dr. Thompson Grayer,    We have scheduled the above patient for an endoscopic procedure. Our records show that he is on anticoagulation therapy.   Please advise as to how long the patient may come off his therapy of Coumadin prior to the procedure, which is scheduled for Monday 12-05-2016. The last the patient took the Coumadin was Tuesday evening, 9-18.   Please  route the completed the Coumadin clearance instructions to [facility] CMA.   Sincerely,   Amy Esterwood PA-C

## 2016-11-30 NOTE — Telephone Encounter (Signed)
Lm for the patient on the mobile number advising per Edrick Oh RN, [facility] that he can continue to hold ( not take) his Coumadin until after the procedure on Monday 12-05-2016.  Asked him to call if they have any questions.

## 2016-11-30 NOTE — Telephone Encounter (Signed)
Advised and the patient's wife that I neglected to send Dr.James Allred the Anticoagulation letter regarding him coming off his Coumadin. The wife told me the last he took him Coumadin was  Monday night. He did not take it Tuesday 9-18.  I asked if they would be willing to change his colonoscopy from Friday 9-21 to Monday 9-24 and they both thought that was a good idea.  Cancelled his 9-21 appointment. He is booked for Monday 12-05-2016.  I told her we will get back to them for sure on the Coumadin instructions. I did advise more than likely he will not be taking it until after the Colonoscopy. I did send Dr Rayann Heman a anti-coagulation letter today.

## 2016-11-30 NOTE — Telephone Encounter (Signed)
Jay Campbell is scheduled for a colonoscopy on 12-02-16. [facility] requested that patient check with Dr. Rayann Heman As to coming off of his coumdin.   Please call [phone].

## 2016-11-30 NOTE — Telephone Encounter (Signed)
Pt should continue to hold coumadin until procedure on Monday.  He should restart coumadin the night of procedure and and have INR checked 7-10 days after with PCP (who manages his coumadin).

## 2016-11-30 NOTE — Telephone Encounter (Signed)
See notes dated 11-30-2016.

## 2016-11-30 NOTE — Telephone Encounter (Signed)
See phone notes dated 11-30-2016.

## 2016-11-30 NOTE — Telephone Encounter (Signed)
Pam, CMA with [facility] has routed letter to Dr Rayann Heman, I don't him on schedule today - surgery rescheduled for Monday - should pt continue to hold coumadin?

## 2016-12-02 ENCOUNTER — Encounter: Payer: Medicare Other | Admitting: Gastroenterology

## 2016-12-05 ENCOUNTER — Encounter: Payer: Self-pay | Admitting: Gastroenterology

## 2016-12-05 ENCOUNTER — Ambulatory Visit (AMBULATORY_SURGERY_CENTER): Payer: Medicare Other | Admitting: Gastroenterology

## 2016-12-05 VITALS — BP 115/61 | HR 63 | Temp 97.1°F | Resp 17 | Ht 70.0 in | Wt 203.0 lb

## 2016-12-05 DIAGNOSIS — K625 Hemorrhage of anus and rectum: Secondary | ICD-10-CM | POA: Diagnosis present

## 2016-12-05 DIAGNOSIS — K649 Unspecified hemorrhoids: Secondary | ICD-10-CM

## 2016-12-05 MED ORDER — SODIUM CHLORIDE 0.9 % IV SOLN: 500.0000 mL | INTRAVENOUS | Status: DC

## 2016-12-05 NOTE — Progress Notes (Signed)
Report given to PACU, vss 

## 2016-12-05 NOTE — Patient Instructions (Addendum)
YOU HAD AN ENDOSCOPIC PROCEDURE TODAY AT [facility]:   Refer to the procedure report that was given to you for any specific questions about what was found during the examination.  If the procedure report does not answer your questions, please call your gastroenterologist to clarify.  If you requested that your care partner not be given the details of your procedure findings, then the procedure report has been included in a sealed envelope for you to review at your convenience later.  YOU SHOULD EXPECT: Some feelings of bloating in the abdomen. Passage of more gas than usual.  Walking can help get rid of the air that was put into your GI tract during the procedure and reduce the bloating. If you had a lower endoscopy (such as a colonoscopy or flexible sigmoidoscopy) you may notice spotting of blood in your stool or on the toilet paper. If you underwent a bowel prep for your procedure, you may not have a normal bowel movement for a few days.  Please Note:  You might notice some irritation and congestion in your nose or some drainage.  This is from the oxygen used during your procedure.  There is no need for concern and it should clear up in a day or so.  SYMPTOMS TO REPORT IMMEDIATELY:   Following lower endoscopy (colonoscopy or flexible sigmoidoscopy):  Excessive amounts of blood in the stool  Significant tenderness or worsening of abdominal pains  Swelling of the abdomen that is new, acute  Fever of 100F or higher  For urgent or emergent issues, a gastroenterologist can be reached at any hour by calling [phone].   DIET:  We do recommend a small meal at first, but then you may proceed to your regular diet.  Drink plenty of fluids but you should avoid alcoholic beverages for 24 hours.  ACTIVITY:  You should plan to take it easy for the rest of today and you should NOT DRIVE or use heavy machinery until tomorrow (because of the sedation medicines used during the test).     FOLLOW UP: Our staff will call the number listed on your records the next business day following your procedure to check on you and address any questions or concerns that you may have regarding the information given to you following your procedure. If we do not reach you, we will leave a message.  However, if you are feeling well and you are not experiencing any problems, there is no need to return our call.  We will assume that you have returned to your regular daily activities without incident.  If any biopsies were taken you will be contacted by phone or by letter within the next 1-3 weeks.  Please call us at [phone] if you have not heard about the biopsies in 3 weeks.   Diverticulosis (hnadout given) Hemorrhoids (handout given) Hemorrhoid Banding (handout given) Hold off on Coumadin tonight and call the office tomorrow morning and let Dr. Silverio Decamp know if able to keep Hemorrhoid Banding appointment tomorrow/ .Marland KitchenNo ibuprofen, naproxen or other non-steroidal anti-inflammatory drugs Tylenol okay if needed. No further Colonoscopy screening needed due to age   SIGNATURES/CONFIDENTIALITY: You and/or your care partner have signed paperwork which will be entered into your electronic medical record.  These signatures attest to the fact that that the information above on your After Visit Summary has been reviewed and is understood.  Full responsibility of the confidentiality of this discharge information lies with you and/or your care-partner.

## 2016-12-05 NOTE — Progress Notes (Signed)
Pt's states no medical or surgical changes since previsit or office visit. 

## 2016-12-05 NOTE — Telephone Encounter (Signed)
Will forward to anticoagulation team for management per protocol

## 2016-12-05 NOTE — Op Note (Signed)
[facility] Patient Name: Jay Campbell Procedure Date: 12/05/2016 4:08 PM MRN: [id number] Endoscopist: Mauri Pole , MD Age: 71 Referring MD:  Date of Birth: 08-08-1945 Gender: Male Account #: [id number] Procedure:                Colonoscopy Indications:              Evaluation of unexplained GI bleeding Medicines:                Monitored Anesthesia Care Procedure:                Pre-Anesthesia Assessment:                           - Prior to the procedure, a History and Physical                            was performed, and patient medications and                            allergies were reviewed. The patient's tolerance of                            previous anesthesia was also reviewed. The risks                            and benefits of the procedure and the sedation                            options and risks were discussed with the patient.                            All questions were answered, and informed consent                            was obtained. Prior Anticoagulants: The patient has                            taken no previous anticoagulant or antiplatelet                            agents. ASA Grade Assessment: II - A patient with                            mild systemic disease. After reviewing the risks                            and benefits, the patient was deemed in                            satisfactory condition to undergo the procedure.                           After obtaining informed consent, the colonoscope  was passed under direct vision. Throughout the                            procedure, the patient's blood pressure, pulse, and                            oxygen saturations were monitored continuously. The                            Model CF-HQ190L [id number]) scope was introduced                            through the anus and advanced to the the cecum,                            identified by  appendiceal orifice and ileocecal                            valve. The colonoscopy was performed without                            difficulty. The patient tolerated the procedure                            well. The quality of the bowel preparation was                            excellent. The ileocecal valve, appendiceal                            orifice, and rectum were photographed. Scope In: 4:10:44 PM Scope Out: 4:26:05 PM Scope Withdrawal Time: 0 hours 10 minutes 37 seconds  Total Procedure Duration: 0 hours 15 minutes 21 seconds  Findings:                 The perianal and digital rectal examinations were                            normal.                           A few small-mouthed diverticula were found in the                            sigmoid colon.                           Non-bleeding internal hemorrhoids were found during                            retroflexion. The hemorrhoids were medium-sized.                           The exam was otherwise without abnormality. Complications:            No immediate complications. Estimated Blood Loss:  Estimated blood loss: none. Impression:               - Diverticulosis in the sigmoid colon.                           - Non-bleeding internal hemorrhoids.                           - The examination was otherwise normal.                           - No specimens collected. Recommendation:           - Patient has a contact number available for                            emergencies. The signs and symptoms of potential                            delayed complications were discussed with the                            patient. Return to normal activities tomorrow.                            Written discharge instructions were provided to the                            patient.                           - Resume previous diet.                           - Continue present medications.                           - Resume Coumadin  (warfarin) at prior dose today.                            Refer to Coumadin Clinic for further adjustment of                            therapy.                           - No ibuprofen, naproxen, or other non-steroidal                            anti-inflammatory drugs.                           - Return to GI office at the next available                            appointment for possible hemorrhoidal band ligation.                           -  No repeat colonoscopy due to age. Mauri Pole, MD 12/05/2016 4:32:10 PM This report has been signed electronically.

## 2016-12-06 ENCOUNTER — Telehealth: Payer: Self-pay | Admitting: *Deleted

## 2016-12-06 ENCOUNTER — Encounter: Payer: Self-pay | Admitting: Gastroenterology

## 2016-12-06 ENCOUNTER — Ambulatory Visit (INDEPENDENT_AMBULATORY_CARE_PROVIDER_SITE_OTHER): Payer: Medicare Other | Admitting: Gastroenterology

## 2016-12-06 VITALS — BP 130/80 | HR 68 | Ht 71.0 in | Wt 203.2 lb

## 2016-12-06 DIAGNOSIS — K641 Second degree hemorrhoids: Secondary | ICD-10-CM | POA: Diagnosis not present

## 2016-12-06 DIAGNOSIS — K625 Hemorrhage of anus and rectum: Secondary | ICD-10-CM

## 2016-12-06 NOTE — Telephone Encounter (Signed)
Will route to anticoagulation team for management per protocol.

## 2016-12-06 NOTE — Progress Notes (Signed)
PROCEDURE NOTE: The patient presents with symptomatic grade II hemorrhoids, requesting rubber band ligation of his/her hemorrhoidal disease.  All risks, benefits and alternative forms of therapy were described and informed consent was obtained.  In the Left Lateral Decubitus position anoscopic examination revealed grade II hemorrhoids in the Right posterior, left lateral and right anterior position(s).  The anorectum was pre-medicated with 0.125% Nitroglycerine The decision was made to band the Right posterior internal hemorrhoid, and the [place] was used to perform band ligation without complication.  Digital anorectal examination was then performed to assure proper positioning of the band, and to adjust the banded tissue as required.  The patient was discharged home without pain or other issues.  Dietary and behavioral recommendations were given and along with follow-up instructions.     The following adjunctive treatments were recommended: Restart Coumadin tomorrow  The patient will return in 2-4 weeks for  follow-up and possible additional banding as required. No complications were encountered and the patient tolerated the procedure well.  Damaris Hippo , MD [phone] Mon-Fri 8a-5p [phone] after 5p, weekends, holidays

## 2016-12-06 NOTE — Telephone Encounter (Signed)
RE: Jay Campbell DOB: 10/01/1945 MRN: [id number]   Dear  Dr Rayann Heman,    We have scheduled the above patient for an endoscopic procedure. Our records show that he is on anticoagulation therapy.   Please advise as to how long the patient may come off his therapy of Coumadin prior to the procedure, which is scheduled for 11/7 and 11/29  Please fax back/ or route the completed form to [place] at [phone].   Sincerely,    Ruthann Cancer AAMA

## 2016-12-06 NOTE — Patient Instructions (Signed)

## 2016-12-06 NOTE — Telephone Encounter (Signed)
  Follow up Call-  Call back number 12/05/2016 06/05/2014  Post procedure Call Back phone  # [phone]  Permission to leave phone message Yes Yes  Some recent data might be hidden     Patient questions:  Do you have a fever, pain , or abdominal swelling? No. Pain Score  0 *  Have you tolerated food without any problems? Yes.    Have you been able to return to your normal activities? Yes.    Do you have any questions about your discharge instructions: Diet   No. Medications  No. Follow up visit  No.  Do you have questions or concerns about your Care? No.  Actions: * If pain score is 4 or above: No action needed, pain <4.

## 2016-12-07 NOTE — Telephone Encounter (Signed)
Pt takes warfarin for afib with CHADS2 of 1 (HTN) and CHADS2VASc of 3 (age, CAD, HTN). Ok to hold warfarin 5 days prior to procedure on 11/7. He will need to resume Coumadin on 11/7 as normal and then hold for another 5 days before 2nd procedure on 11/29. Coumadin to resume on 11/29 in PM. Clearance routed to Genella Mech, CMA.

## 2016-12-07 NOTE — Telephone Encounter (Signed)
Left message for patient to return my call to discuss coumadin and diabetic meds

## 2016-12-12 ENCOUNTER — Encounter: Payer: Medicare Other | Admitting: Gastroenterology

## 2016-12-15 NOTE — Telephone Encounter (Signed)
2nd call to patient to hold BT 3 days before banding

## 2017-01-12 ENCOUNTER — Encounter: Payer: Self-pay | Admitting: Cardiology

## 2017-01-12 ENCOUNTER — Ambulatory Visit (INDEPENDENT_AMBULATORY_CARE_PROVIDER_SITE_OTHER): Payer: Medicare Other | Admitting: Cardiology

## 2017-01-12 VITALS — BP 122/72 | HR 63 | Ht 72.0 in | Wt 204.8 lb

## 2017-01-12 DIAGNOSIS — I482 Chronic atrial fibrillation, unspecified: Secondary | ICD-10-CM

## 2017-01-12 DIAGNOSIS — Z950 Presence of cardiac pacemaker: Secondary | ICD-10-CM | POA: Diagnosis not present

## 2017-01-12 DIAGNOSIS — E782 Mixed hyperlipidemia: Secondary | ICD-10-CM

## 2017-01-12 DIAGNOSIS — I2510 Atherosclerotic heart disease of native coronary artery without angina pectoris: Secondary | ICD-10-CM

## 2017-01-12 NOTE — Progress Notes (Signed)
Cardiology Office Note  Date: 01/12/2017   ID: Jay Campbell, DOB Feb 03, 1946, MRN [id number]  PCP: Monico Blitz, MD  Primary Cardiologist: Rozann Lesches, MD   Chief Complaint  Patient presents with  . Atrial Fibrillation    History of Present Illness: Jay Campbell is a 71 y.o. male last seen in June 2017.  He presents with his wife for a routine follow-up visit.  His last encounter he does not report any significant feeling of palpitations.  He occasionally has a very brief, sharp chest discomfort that is atypical for angina.  He has had no dizziness or syncope.  I reviewed his medications which are outlined below and stable from a cardiac perspective.  He continues to follow with Dr. Rayann Heman in the device clinic, [place] pacemaker in place.  Last device interrogation showed normal function.  He remains on Coumadin with follow-up per Dr. Manuella Ghazi.  He does not report any spontaneous bleeding problems.  Past Medical History:  Diagnosis Date  . Anxiety   . Atrial fibrillation ([place])    Permanent  . Coronary artery disease    Mild nonobstructive 1/12  . DM (diabetes mellitus) ([place])   . GERD (gastroesophageal reflux disease)   . Gout   . Hearing disorder, cochlear   . Hiatal hernia   . HLD (hyperlipidemia)   . HTN (hypertension)   . S/P AV nodal ablation    s/p SJM PPM; gen change 02-01-13 by Dr Rayann Heman  . Warfarin anticoagulation     Past Surgical History:  Procedure Laterality Date  . CATARACT EXTRACTION Right   . COCHLEAR IMPLANT Right   . PACEMAKER GENERATOR CHANGE Bilateral 02/01/2013   Procedure: PACEMAKER GENERATOR CHANGE;  Surgeon: Coralyn Mark, MD;  Location: [facility];  Service: Cardiovascular;  Laterality: Bilateral;  . PACEMAKER PLACEMENT  2002; 2014   SJM implanted by Dr Lovena Le with AV nodal ablation performed; gen change to [employer] SR RF by Dr Rayann Heman 02-01-13  . RETINAL LASER PROCEDURE Left   . SLT LASER APPLICATION Left 17/06/812   Procedure:  SLT LASER APPLICATION;  Surgeon: Williams Che, MD;  Location: [facility];  Service: Ophthalmology;  Laterality: Left;    Current Outpatient Prescriptions  Medication Sig Dispense Refill  . allopurinol (ZYLOPRIM) 100 MG tablet Take 100 mg by mouth Daily.     . clonazePAM (KLONOPIN) 0.5 MG tablet Take 0.5 mg by mouth daily.     Marland Kitchen COLCRYS 0.6 MG tablet Take 0.6 mg by mouth as needed.     . fish oil-omega-3 fatty acids 1000 MG capsule Take 1 g by mouth 2 (two) times daily.      Marland Kitchen HUMULIN 70/30 (70-30) 100 UNIT/ML injection 20 units every morning & 30 units every evening    . isosorbide mononitrate (IMDUR) 60 MG 24 hr tablet Take 120 mg by mouth daily.    . meclizine (ANTIVERT) 25 MG tablet Take 25 mg by mouth as needed.     . metFORMIN (GLUCOPHAGE) 1000 MG tablet Take 1,000 mg by mouth 2 (two) times daily with a meal.      . metoprolol succinate (TOPROL-XL) 25 MG 24 hr tablet Take 0.5 tablets by mouth Daily.    . nitroGLYCERIN (NITROSTAT) 0.4 MG SL tablet Place 1 tablet (0.4 mg total) under the tongue every 5 (five) minutes x 3 doses as needed. 25 tablet 3  . ramipril (ALTACE) 5 MG capsule Take 5 mg by mouth daily.      Marland Kitchen  ranitidine (ZANTAC) 150 MG tablet Take 1 tablet by mouth 2 (two) times daily.    . sertraline (ZOLOFT) 50 MG tablet Take 1/2 tab daily    . simvastatin (ZOCOR) 40 MG tablet Take 40 mg by mouth at bedtime.      Marland Kitchen warfarin (COUMADIN) 5 MG tablet Take 5-7.5 mg by mouth daily. 7.5 mg on Monday, Wednesday, and Friday and 5 mg all other days (MANAGED BY PMD)     No current facility-administered medications for this visit.    Allergies:  Contrast media [iodinated diagnostic agents]; Shellfish allergy; and Sulfonamide derivatives  Social History: The patient  reports that he has never smoked. He has never used smokeless tobacco. He reports that he does not drink alcohol or use drugs.   ROS:  Please see the history of present illness. Otherwise, complete review of systems is positive  for pending hemorrhoidal banding.  All other systems are reviewed and negative.   Physical Exam: VS:  BP 122/72   Pulse 63   Ht 6' (1.829 m)   Wt 204 lb 12.8 oz (92.9 kg)   SpO2 98%   BMI 27.78 kg/m , BMI Body mass index is 27.78 kg/m.  Wt Readings from Last 3 Encounters:  01/12/17 204 lb 12.8 oz (92.9 kg)  12/06/16 203 lb 4 oz (92.2 kg)  12/05/16 203 lb (92.1 kg)    General: Patient appears comfortable at rest. HEENT: Conjunctiva and lids normal, oropharynx clear. Neck: Supple, no elevated JVP or carotid bruits, no thyromegaly. Lungs: Clear to auscultation, nonlabored breathing at rest. Cardiac: Regular rate and rhythm, no S3 or significant systolic murmur, no pericardial rub. Abdomen: Soft, nontender, bowel sounds present, no guarding or rebound. Extremities: No pitting edema, distal pulses 2+. Skin: Warm and dry. Musculoskeletal: No kyphosis. Neuropsychiatric: Alert and oriented x3, affect grossly appropriate.  ECG: I personally reviewed the tracing from 11/12/2014 which showed atrial fibrillation with ventricular pacing.  Recent Labwork:  August 2018: Cholesterol 107, triglycerides 92, HDL 38, LDL 51, BUN 19, creatinine 1.23, potassium 4.6, AST 38, ALT 32, TSH 2.81  Other Studies Reviewed Today:  Lexiscan Cardiolite 03/26/2014: FINDINGS: Stress/ECG data: The patient was stressed according to the Lexi scan protocol. The heart rate remain in the 60 beats per min range. The blood pressure averaged 120/60. No chest pain was reported.  ECG demonstrated a paced ventricular rhythm with underlying atrial fibrillation. There were no changes with stress.  Perfusion: No perfusion defects to suggest ischemia or infarction.  Wall Motion: Normal regional wall motion. No left ventricular dilation.  Left Ventricular Ejection Fraction: 64 %  End diastolic volume 161 ml  End systolic volume 45 ml  IMPRESSION: 1. No evidence of myocardial ischemia or scar.  2.  Normal regional wall motion.  3. Left ventricular ejection fraction 64%  4. Low-risk stress test findings*.  Assessment and Plan:  1.  Chronic atrial fibrillation.  He continues on Coumadin with follow-up per Dr. Manuella Ghazi.  He does not report any palpitations.  2.  History of AV node ablation with [employer] pacemaker in place.  He continues to follow with Dr. Rayann Heman, last device interrogation showed normal function.  3.  History of nonobstructive CAD with low risk stress testing in 2016.  No definite angina symptoms.  He remains on statin therapy.  4.  Hyperlipidemia, on Zocor.  Recent LDL 51.  Current medicines were reviewed with the patient today.  Disposition: Follow-up in 1 year.  Signed, Satira Sark, MD, [facility] 01/12/2017  1:50 PM    [facility] at [place], [place], [place] [address] Phone: [phone]; Fax: [phone]

## 2017-01-12 NOTE — Patient Instructions (Signed)

## 2017-01-16 ENCOUNTER — Ambulatory Visit (INDEPENDENT_AMBULATORY_CARE_PROVIDER_SITE_OTHER): Payer: Medicare Other | Admitting: *Deleted

## 2017-01-16 DIAGNOSIS — I442 Atrioventricular block, complete: Secondary | ICD-10-CM

## 2017-01-17 NOTE — Progress Notes (Signed)
Remote pacemaker transmission.   

## 2017-01-18 ENCOUNTER — Encounter: Payer: Self-pay | Admitting: Gastroenterology

## 2017-01-18 ENCOUNTER — Ambulatory Visit: Payer: Medicare Other | Admitting: Gastroenterology

## 2017-01-18 ENCOUNTER — Encounter: Payer: Self-pay | Admitting: Cardiology

## 2017-01-18 VITALS — BP 120/70 | HR 72 | Ht 72.0 in | Wt 207.0 lb

## 2017-01-18 DIAGNOSIS — K641 Second degree hemorrhoids: Secondary | ICD-10-CM

## 2017-01-18 NOTE — Progress Notes (Signed)
PROCEDURE NOTE: The patient presents with symptomatic grade II  hemorrhoids, requesting rubber band ligation of his/her hemorrhoidal disease.  All risks, benefits and alternative forms of therapy were described and informed consent was obtained.   The anorectum was pre-medicated with 0.125% and Recticare The decision was made to band the Left lateral internal hemorrhoid, and the [place] was used to perform band ligation without complication.  Digital anorectal examination was then performed to assure proper positioning of the band, and to adjust the banded tissue as required.  The patient was discharged home without pain or other issues.  Dietary and behavioral recommendations were given and along with follow-up instructions.    Ok to restart Coumadin tomorrow   The patient will return  for  follow-up and possible additional banding as required. No complications were encountered and the patient tolerated the procedure well.  Damaris Hippo , MD [phone] Mon-Fri 8a-5p [phone] after 5p, weekends, holidays

## 2017-01-18 NOTE — Patient Instructions (Addendum)
HEMORRHOID BANDING PROCEDURE    FOLLOW-UP CARE   1. The procedure you have had should have been relatively painless since the banding of the area involved does not have nerve endings and there is no pain sensation.  The rubber band cuts off the blood supply to the hemorrhoid and the band may fall off as soon as 48 hours after the banding (the band may occasionally be seen in the toilet bowl following a bowel movement). You may notice a temporary feeling of fullness in the rectum which should respond adequately to plain Tylenol or Motrin.  2. Following the banding, avoid strenuous exercise that evening and resume full activity the next day.  A sitz bath (soaking in a warm tub) or bidet is soothing, and can be useful for cleansing the area after bowel movements.     3. To avoid constipation, take two tablespoons of natural wheat bran, natural oat bran, flax, Benefiber or any over the counter fiber supplement and increase your water intake to 7-8 glasses daily.    4. Unless you have been prescribed anorectal medication, do not put anything inside your rectum for two weeks: No suppositories, enemas, fingers, etc.  5. Occasionally, you may have more bleeding than usual after the banding procedure.  This is often from the untreated hemorrhoids rather than the treated one.  Don't be concerned if there is a tablespoon or so of blood.  If there is more blood than this, lie flat with your bottom higher than your head and apply an ice pack to the area. If the bleeding does not stop within a half an hour or if you feel faint, call our office at [phone] or go to the emergency room.  6. Problems are not common; however, if there is a substantial amount of bleeding, severe pain, chills, fever or difficulty passing urine (very rare) or other problems, you should call us at [phone] or report to the nearest emergency room.  7. Do not stay seated continuously for more than 2-3 hours for a day or two  after the procedure.  Tighten your buttock muscles 10-15 times every two hours and take 10-15 deep breaths every 1-2 hours.  Do not spend more than a few minutes on the toilet if you cannot empty your bowel; instead re-visit the toilet at a later time.   Start back coumadin tomorrow

## 2017-01-31 LAB — CUP PACEART REMOTE DEVICE CHECK
Battery Remaining Percentage: 95.5 %
Implantable Lead Implant Date: 20020215
Implantable Lead Location: 753860
Implantable Pulse Generator Implant Date: 20141121
Lead Channel Pacing Threshold Amplitude: 1.375 V
Lead Channel Pacing Threshold Pulse Width: 0.4 ms
Lead Channel Setting Sensing Sensitivity: 6 mV
MDC IDC MSMT BATTERY REMAINING LONGEVITY: 140 mo
MDC IDC MSMT BATTERY VOLTAGE: 3.01 V
MDC IDC MSMT LEADCHNL RV IMPEDANCE VALUE: 590 Ohm
MDC IDC MSMT LEADCHNL RV SENSING INTR AMPL: 12 mV
MDC IDC PG SERIAL: 7488472
MDC IDC SESS DTM: 20181105075645
MDC IDC SET LEADCHNL RV PACING AMPLITUDE: 1.625
MDC IDC SET LEADCHNL RV PACING PULSEWIDTH: 0.4 ms
MDC IDC STAT BRADY RV PERCENT PACED: 99 %
Pulse Gen Model: 1240

## 2017-02-09 ENCOUNTER — Ambulatory Visit: Payer: Medicare Other | Admitting: Gastroenterology

## 2017-02-09 ENCOUNTER — Encounter: Payer: Self-pay | Admitting: Gastroenterology

## 2017-02-09 DIAGNOSIS — K641 Second degree hemorrhoids: Secondary | ICD-10-CM

## 2017-02-09 NOTE — Patient Instructions (Addendum)
HEMORRHOID BANDING PROCEDURE    FOLLOW-UP CARE                     Start Coumadin back tomorrow 02/10/2017   1. The procedure you have had should have been relatively painless since the banding of the area involved does not have nerve endings and there is no pain sensation.  The rubber band cuts off the blood supply to the hemorrhoid and the band may fall off as soon as 48 hours after the banding (the band may occasionally be seen in the toilet bowl following a bowel movement). You may notice a temporary feeling of fullness in the rectum which should respond adequately to plain Tylenol or Motrin.  2. Following the banding, avoid strenuous exercise that evening and resume full activity the next day.  A sitz bath (soaking in a warm tub) or bidet is soothing, and can be useful for cleansing the area after bowel movements.     3. To avoid constipation, take two tablespoons of natural wheat bran, natural oat bran, flax, Benefiber or any over the counter fiber supplement and increase your water intake to 7-8 glasses daily.    4. Unless you have been prescribed anorectal medication, do not put anything inside your rectum for two weeks: No suppositories, enemas, fingers, etc.  5. Occasionally, you may have more bleeding than usual after the banding procedure.  This is often from the untreated hemorrhoids rather than the treated one.  Don't be concerned if there is a tablespoon or so of blood.  If there is more blood than this, lie flat with your bottom higher than your head and apply an ice pack to the area. If the bleeding does not stop within a half an hour or if you feel faint, call our office at [phone] or go to the emergency room.  6. Problems are not common; however, if there is a substantial amount of bleeding, severe pain, chills, fever or difficulty passing urine (very rare) or other problems, you should call us at [phone] or report to the nearest emergency room.  7. Do not  stay seated continuously for more than 2-3 hours for a day or two after the procedure.  Tighten your buttock muscles 10-15 times every two hours and take 10-15 deep breaths every 1-2 hours.  Do not spend more than a few minutes on the toilet if you cannot empty your bowel; instead re-visit the toilet at a later time.

## 2017-02-09 NOTE — Progress Notes (Signed)
PROCEDURE NOTE: The patient presents with symptomatic grade II  hemorrhoids, requesting rubber band ligation of his/her hemorrhoidal disease.  All risks, benefits and alternative forms of therapy were described and informed consent was obtained.   The anorectum was pre-medicated with 0.125% Nitroglycerine and recticare The decision was made to band the Right anterior  internal hemorrhoid, and the [place] was used to perform band ligation without complication.  Digital anorectal examination was then performed to assure proper positioning of the band, and to adjust the banded tissue as required.  The patient was discharged home without pain or other issues.  Dietary and behavioral recommendations were given and along with follow-up instructions.     Ok to restart Coumadin tomorrow  The patient will return  for  follow-up and possible additional banding as needed. No complications were encountered and the patient tolerated the procedure well.  Damaris Hippo , MD [phone] Mon-Fri 8a-5p [phone] after 5p, weekends, holidays

## 2017-02-17 ENCOUNTER — Encounter (INDEPENDENT_AMBULATORY_CARE_PROVIDER_SITE_OTHER): Payer: Medicare Other | Admitting: Ophthalmology

## 2017-02-17 DIAGNOSIS — H2512 Age-related nuclear cataract, left eye: Secondary | ICD-10-CM

## 2017-02-17 DIAGNOSIS — H33302 Unspecified retinal break, left eye: Secondary | ICD-10-CM

## 2017-02-17 DIAGNOSIS — I10 Essential (primary) hypertension: Secondary | ICD-10-CM

## 2017-02-17 DIAGNOSIS — H26491 Other secondary cataract, right eye: Secondary | ICD-10-CM | POA: Diagnosis not present

## 2017-02-17 DIAGNOSIS — H35033 Hypertensive retinopathy, bilateral: Secondary | ICD-10-CM

## 2017-02-17 DIAGNOSIS — H43813 Vitreous degeneration, bilateral: Secondary | ICD-10-CM

## 2017-02-24 ENCOUNTER — Telehealth: Payer: Self-pay | Admitting: Cardiology

## 2017-02-24 NOTE — Telephone Encounter (Signed)
Mrs. Okerlund called stating that Jay Campbell shoveled snow 2 days ago. States that he has been feeling very fatigue.

## 2017-02-24 NOTE — Telephone Encounter (Signed)
Attempted to return call at number given - number states has been changed, disconnected, or no longer in service.

## 2017-02-24 NOTE — Telephone Encounter (Signed)
Weakness, nervous, everything crosses his mind worries him.  Shoveled snow 2 days ago.  No chest pain.  Dizziness - but that is not new.  No SOB.  Fatigue, which is new since shoveling the snow.  BP 165/70 60 - today.  No syncope.  Also, stated that he went hunting the other morning & got real hot before getting to stand.  Has not noticed elevation in heart rate either.

## 2017-02-24 NOTE — Telephone Encounter (Signed)
Patient & wife Jay Campbell) notified & verbalized understanding.

## 2017-02-24 NOTE — Telephone Encounter (Signed)
Seems to have several different symptoms at this time based on note.  Would recommend that he be evaluated, should see his PCP as a starting point.  He could certainly be scheduled for a visit here if that was felt to be necessary based on his initial assessment.

## 2017-03-20 ENCOUNTER — Encounter (INDEPENDENT_AMBULATORY_CARE_PROVIDER_SITE_OTHER): Payer: Medicare Other | Admitting: Ophthalmology

## 2017-03-20 DIAGNOSIS — H2701 Aphakia, right eye: Secondary | ICD-10-CM

## 2017-04-11 ENCOUNTER — Encounter (HOSPITAL_COMMUNITY): Payer: Self-pay | Admitting: Emergency Medicine

## 2017-04-11 ENCOUNTER — Other Ambulatory Visit: Payer: Self-pay

## 2017-04-11 ENCOUNTER — Emergency Department (HOSPITAL_COMMUNITY)
Admission: EM | Admit: 2017-04-11 | Discharge: 2017-04-11 | Disposition: A | Discharge status: Home / Self Care | Payer: Medicare Other | Attending: Emergency Medicine | Admitting: Emergency Medicine

## 2017-04-11 ENCOUNTER — Emergency Department (HOSPITAL_COMMUNITY): Payer: Medicare Other

## 2017-04-11 DIAGNOSIS — Z950 Presence of cardiac pacemaker: Secondary | ICD-10-CM | POA: Diagnosis not present

## 2017-04-11 DIAGNOSIS — R1032 Left lower quadrant pain: Secondary | ICD-10-CM | POA: Insufficient documentation

## 2017-04-11 DIAGNOSIS — N289 Disorder of kidney and ureter, unspecified: Secondary | ICD-10-CM

## 2017-04-11 DIAGNOSIS — R1110 Vomiting, unspecified: Secondary | ICD-10-CM | POA: Insufficient documentation

## 2017-04-11 DIAGNOSIS — I2510 Atherosclerotic heart disease of native coronary artery without angina pectoris: Secondary | ICD-10-CM | POA: Diagnosis not present

## 2017-04-11 DIAGNOSIS — Z794 Long term (current) use of insulin: Secondary | ICD-10-CM | POA: Insufficient documentation

## 2017-04-11 DIAGNOSIS — E119 Type 2 diabetes mellitus without complications: Secondary | ICD-10-CM | POA: Insufficient documentation

## 2017-04-11 DIAGNOSIS — E875 Hyperkalemia: Secondary | ICD-10-CM | POA: Insufficient documentation

## 2017-04-11 DIAGNOSIS — I10 Essential (primary) hypertension: Secondary | ICD-10-CM | POA: Diagnosis not present

## 2017-04-11 DIAGNOSIS — Z7901 Long term (current) use of anticoagulants: Secondary | ICD-10-CM | POA: Diagnosis not present

## 2017-04-11 DIAGNOSIS — Z79899 Other long term (current) drug therapy: Secondary | ICD-10-CM | POA: Diagnosis not present

## 2017-04-11 DIAGNOSIS — R197 Diarrhea, unspecified: Secondary | ICD-10-CM | POA: Diagnosis not present

## 2017-04-11 DIAGNOSIS — R51 Headache: Secondary | ICD-10-CM | POA: Diagnosis not present

## 2017-04-11 LAB — COMPREHENSIVE METABOLIC PANEL WITH GFR
ALT: 32 U/L (ref 17–63)
AST: 33 U/L (ref 15–41)
Albumin: 4.5 g/dL (ref 3.5–5.0)
Alkaline Phosphatase: 114 U/L (ref 38–126)
Anion gap: 10 (ref 5–15)
BUN: 25 mg/dL — ABNORMAL HIGH (ref 6–20)
CO2: 18 mmol/L — ABNORMAL LOW (ref 22–32)
Calcium: 9.7 mg/dL (ref 8.9–10.3)
Chloride: 107 mmol/L (ref 101–111)
Creatinine, Ser: 1.6 mg/dL — ABNORMAL HIGH (ref 0.61–1.24)
GFR calc Af Amer: 48 mL/min — ABNORMAL LOW
GFR calc non Af Amer: 41 mL/min — ABNORMAL LOW
Glucose, Bld: 227 mg/dL — ABNORMAL HIGH (ref 65–99)
Potassium: 6 mmol/L — ABNORMAL HIGH (ref 3.5–5.1)
Sodium: 135 mmol/L (ref 135–145)
Total Bilirubin: 1.6 mg/dL — ABNORMAL HIGH (ref 0.3–1.2)
Total Protein: 9.1 g/dL — ABNORMAL HIGH (ref 6.5–8.1)

## 2017-04-11 LAB — URINALYSIS, ROUTINE W REFLEX MICROSCOPIC
Glucose, UA: NEGATIVE mg/dL
Hgb urine dipstick: NEGATIVE
Ketones, ur: NEGATIVE mg/dL
Leukocytes, UA: NEGATIVE
Nitrite: NEGATIVE
Protein, ur: 100 mg/dL — AB
Specific Gravity, Urine: 1.027 (ref 1.005–1.030)
pH: 5 (ref 5.0–8.0)

## 2017-04-11 LAB — CBC
HCT: 44.8 % (ref 39.0–52.0)
Hemoglobin: 14.9 g/dL (ref 13.0–17.0)
MCH: 34.9 pg — ABNORMAL HIGH (ref 26.0–34.0)
MCHC: 33.3 g/dL (ref 30.0–36.0)
MCV: 104.9 fL — ABNORMAL HIGH (ref 78.0–100.0)
Platelets: 149 K/uL — ABNORMAL LOW (ref 150–400)
RBC: 4.27 MIL/uL (ref 4.22–5.81)
RDW: 13 % (ref 11.5–15.5)
WBC: 7.1 K/uL (ref 4.0–10.5)

## 2017-04-11 LAB — PROTIME-INR
INR: 2.42
Prothrombin Time: 26.2 seconds — ABNORMAL HIGH (ref 11.4–15.2)

## 2017-04-11 LAB — BASIC METABOLIC PANEL WITH GFR
Anion gap: 8 (ref 5–15)
BUN: 25 mg/dL — ABNORMAL HIGH (ref 6–20)
CO2: 17 mmol/L — ABNORMAL LOW (ref 22–32)
Calcium: 9 mg/dL (ref 8.9–10.3)
Chloride: 113 mmol/L — ABNORMAL HIGH (ref 101–111)
Creatinine, Ser: 1.51 mg/dL — ABNORMAL HIGH (ref 0.61–1.24)
GFR calc Af Amer: 51 mL/min — ABNORMAL LOW
GFR calc non Af Amer: 44 mL/min — ABNORMAL LOW
Glucose, Bld: 150 mg/dL — ABNORMAL HIGH (ref 65–99)
Potassium: 5.6 mmol/L — ABNORMAL HIGH (ref 3.5–5.1)
Sodium: 138 mmol/L (ref 135–145)

## 2017-04-11 LAB — CBG MONITORING, ED: GLUCOSE-CAPILLARY: 209 mg/dL — AB (ref 65–99)

## 2017-04-11 LAB — LIPASE, BLOOD: Lipase: 20 U/L (ref 11–51)

## 2017-04-11 MED ORDER — SODIUM CHLORIDE 0.9 % IV BOLUS (SEPSIS)
1000.0000 mL | Freq: Once | INTRAVENOUS | Status: AC
  Administered 2017-04-11: 1000 mL via INTRAVENOUS

## 2017-04-11 MED ORDER — ONDANSETRON HCL 4 MG/2ML IJ SOLN
4.0000 mg | Freq: Once | INTRAMUSCULAR | Status: AC
  Administered 2017-04-11: 4 mg via INTRAVENOUS
  Filled 2017-04-11: qty 2

## 2017-04-11 MED ORDER — ONDANSETRON 4 MG PO TBDP: 4.0000 mg | ORAL_TABLET | Freq: Three times a day (TID) | ORAL | 0 refills | Status: DC | PRN

## 2017-04-11 MED ORDER — SODIUM CHLORIDE 0.9 % IV SOLN
INTRAVENOUS | Status: DC
  Administered 2017-04-11: 14:00:00 via INTRAVENOUS

## 2017-04-11 MED ORDER — LOPERAMIDE HCL 2 MG PO CAPS: 2.0000 mg | ORAL_CAPSULE | Freq: Four times a day (QID) | ORAL | 0 refills | Status: DC | PRN

## 2017-04-11 NOTE — ED Triage Notes (Signed)
Pt c/o of n/v/d and lower ABD pain since Sunday.  Denies fever.

## 2017-04-11 NOTE — ED Provider Notes (Signed)
[facility] EMERGENCY DEPARTMENT Provider Note   CSN: [id number] Arrival date & time: 04/11/17  1033     History   Chief Complaint Chief Complaint  Patient presents with  . Diarrhea    HPI Jay Campbell is a 72 y.o. male.  HPI  The patient is a 72 year old male, history of atrial fibrillation, history of diabetes and a history of hypertension.  He is currently on warfarin for anticoagulation.  The patient presents to the hospital today with a complaint of diarrhea which has been present since Sunday morning, 48 hours ago.  He has had over 58 episodes of watery yellow diarrhea with small amounts of little flecks of chunks, this was nonbloody until yesterday when he noticed some blood on the paper after wiping extensively after that many bowel movements.  He describes some lower abdominal pain mostly in the left mid abdomen, left lower quadrant and suprapubic area which is rather constant but not associated with fevers or chills.  He has had approximately 5 episodes of vomiting during this timeframe.  He denies fevers, denies swelling of the legs, denies dysuria, he states that his mouth is starting to feel dry and he does have a headache today.  He denies any recent travel, denies any recent sick exposures, denies any recent antibiotics.  His wife is present with him and endorses all of these things.  She has not been sick other than having bronchitis recently.  The patient does not have any coughing.  Past Medical History:  Diagnosis Date  . Anxiety   . Atrial fibrillation ([place])    Permanent  . Coronary artery disease    Mild nonobstructive 1/12  . DM (diabetes mellitus) ([place])   . GERD (gastroesophageal reflux disease)   . Gout   . Hearing disorder, cochlear   . Hiatal hernia   . HLD (hyperlipidemia)   . HTN (hypertension)   . S/P AV nodal ablation    s/p SJM PPM; gen change 02-01-13 by Dr Rayann Heman  . Warfarin anticoagulation     Patient Active Problem List   Diagnosis  Date Noted  . Precordial pain 03/20/2014  . Complete heart block ([place]) 02/01/2013  . S/P AV nodal ablation   . Warfarin anticoagulation   . CORONARY ATHEROSCLEROSIS NATIVE CORONARY ARTERY 02/01/2008  . Permanent atrial fibrillation ([place]) 02/01/2008  . Cardiac pacemaker in situ 02/01/2008    Past Surgical History:  Procedure Laterality Date  . CATARACT EXTRACTION Right   . COCHLEAR IMPLANT Right   . HEMORRHOID BANDING    . PACEMAKER GENERATOR CHANGE Bilateral 02/01/2013   Procedure: PACEMAKER GENERATOR CHANGE;  Surgeon: Coralyn Mark, MD;  Location: [facility] CATH [facility];  Service: Cardiovascular;  Laterality: Bilateral;  . PACEMAKER PLACEMENT  2002; 2014   SJM implanted by Dr Lovena Le with AV nodal ablation performed; gen change to [employer] SR RF by Dr Rayann Heman 02-01-13  . RETINAL LASER PROCEDURE Left   . SLT LASER APPLICATION Left 71/08/9676   Procedure: SLT LASER APPLICATION;  Surgeon: Williams Che, MD;  Location: [facility];  Service: Ophthalmology;  Laterality: Left;       Home Medications    Prior to Admission medications   Medication Sig Start Date End Date Taking? Authorizing Provider  allopurinol (ZYLOPRIM) 100 MG tablet Take 100 mg by mouth Daily.  12/15/11  Yes [provider]  clonazePAM (KLONOPIN) 0.5 MG tablet Take 0.5 mg by mouth daily.  03/11/14  Yes [provider]  fish oil-omega-3 fatty acids  1000 MG capsule Take 1 g by mouth 2 (two) times daily.     Yes [provider]  HUMULIN 70/30 (70-30) 100 UNIT/ML injection Inject 30 Units into the skin 2 (two) times daily with a meal.  05/19/14  Yes [provider]  isosorbide mononitrate (IMDUR) 60 MG 24 hr tablet Take 120 mg by mouth daily. 09/26/11  Yes de Stanford Scotland, MD  metFORMIN (GLUCOPHAGE) 1000 MG tablet Take 1,000 mg by mouth 2 (two) times daily with a meal.     Yes [provider]  metoprolol succinate (TOPROL-XL) 25 MG 24 hr tablet Take 0.5 tablets by mouth Daily. 12/30/11  Yes  [provider]  nitroGLYCERIN (NITROSTAT) 0.4 MG SL tablet Place 1 tablet (0.4 mg total) under the tongue every 5 (five) minutes x 3 doses as needed. 04/15/16  Yes Allred, Jeneen Rinks, MD  ramipril (ALTACE) 5 MG capsule Take 5 mg by mouth daily.     Yes [provider]  ranitidine (ZANTAC) 150 MG tablet Take 1 tablet by mouth 2 (two) times daily. 11/14/16  Yes [provider]  sertraline (ZOLOFT) 50 MG tablet Take 50 mg by mouth daily. Take 1/2 tab daily   Yes [provider]  simvastatin (ZOCOR) 40 MG tablet Take 40 mg by mouth at bedtime.     Yes [provider]  warfarin (COUMADIN) 5 MG tablet Take 5-7.5 mg by mouth daily. 7.5 mg on Monday and Friday and 5 mg all other days (MANAGED BY PMD)   Yes [provider]  loperamide (IMODIUM) 2 MG capsule Take 1 capsule (2 mg total) by mouth 4 (four) times daily as needed for diarrhea or loose stools. 04/11/17   Noemi Chapel, MD  ondansetron (ZOFRAN ODT) 4 MG disintegrating tablet Take 1 tablet (4 mg total) by mouth every 8 (eight) hours as needed for nausea. 04/11/17   Noemi Chapel, MD    Family History Family History  Problem Relation Age of Onset  . COPD Mother   . Emphysema Mother   . Cancer - Lung Father   . Stomach cancer Neg Hx   . Colon cancer Neg Hx     Social History Social History   Tobacco Use  . Smoking status: Never Smoker  . Smokeless tobacco: Never Used  Substance Use Topics  . Alcohol use: No    Alcohol/week: 0.0 oz  . Drug use: No     Allergies   Contrast media [iodinated diagnostic agents]; Shellfish allergy; and Sulfonamide derivatives   Review of Systems Review of Systems  All other systems reviewed and are negative.    Physical Exam Updated Vital Signs BP 131/72 (BP Location: Right Arm)   Pulse 64   Temp 98.3 F (36.8 C)   Resp 14   SpO2 100%   Physical Exam  Constitutional: He appears well-developed and well-nourished. No distress.  HENT:  Head:  Normocephalic and atraumatic.  Mouth/Throat: No oropharyngeal exudate.  Dry mucous membranes  Eyes: Conjunctivae and EOM are normal. Pupils are equal, round, and reactive to light. Right eye exhibits no discharge. Left eye exhibits no discharge. No scleral icterus.  Neck: Normal range of motion. Neck supple. No JVD present. No thyromegaly present.  Cardiovascular: Normal rate, regular rhythm, normal heart sounds and intact distal pulses. Exam reveals no gallop and no friction rub.  No murmur heard. Pulmonary/Chest: Effort normal and breath sounds normal. No respiratory distress. He has no wheezes. He has no rales.  Abdominal: Soft. Bowel sounds  are normal. He exhibits no distension and no mass. There is tenderness ( Tender to palpation in the left mid abdomen down to the left lower quadrant, mild guarding, no other peritoneal signs, no right-sided tenderness).  Musculoskeletal: Normal range of motion. He exhibits no edema or tenderness.  Lymphadenopathy:    He has no cervical adenopathy.  Neurological: He is alert. Coordination normal.  Skin: Skin is warm and dry. No rash noted. No erythema.  Psychiatric: He has a normal mood and affect. His behavior is normal.  Nursing note and vitals reviewed.    ED Treatments / Results  Labs (all labs ordered are listed, but only abnormal results are displayed) Labs Reviewed  COMPREHENSIVE METABOLIC PANEL - Abnormal; Notable for the following components:      Result Value   Potassium 6.0 (*)    CO2 18 (*)    Glucose, Bld 227 (*)    BUN 25 (*)    Creatinine, Ser 1.60 (*)    Total Protein 9.1 (*)    Total Bilirubin 1.6 (*)    GFR calc non Af Amer 41 (*)    GFR calc Af Amer 48 (*)    All other components within normal limits  CBC - Abnormal; Notable for the following components:   MCV 104.9 (*)    MCH 34.9 (*)    Platelets 149 (*)    All other components within normal limits  URINALYSIS, ROUTINE W REFLEX MICROSCOPIC - Abnormal; Notable for the  following components:   Color, Urine AMBER (*)    APPearance CLOUDY (*)    Bilirubin Urine MODERATE (*)    Protein, ur 100 (*)    Bacteria, UA RARE (*)    Squamous Epithelial / LPF 0-5 (*)    All other components within normal limits  PROTIME-INR - Abnormal; Notable for the following components:   Prothrombin Time 26.2 (*)    All other components within normal limits  BASIC METABOLIC PANEL - Abnormal; Notable for the following components:   Potassium 5.6 (*)    Chloride 113 (*)    CO2 17 (*)    Glucose, Bld 150 (*)    BUN 25 (*)    Creatinine, Ser 1.51 (*)    GFR calc non Af Amer 44 (*)    GFR calc Af Amer 51 (*)    All other components within normal limits  CBG MONITORING, ED - Abnormal; Notable for the following components:   Glucose-Capillary 209 (*)    All other components within normal limits  C DIFFICILE QUICK SCREEN W PCR REFLEX  LIPASE, BLOOD    EKG  EKG Interpretation None       Radiology Ct Abdomen Pelvis Wo Contrast  Result Date: 04/11/2017 CLINICAL DATA:  72 year old male with a history of nausea vomiting and diarrhea and lower abdominal pain since Sunday EXAM: CT ABDOMEN AND PELVIS WITHOUT CONTRAST TECHNIQUE: Multidetector CT imaging of the abdomen and pelvis was performed following the standard protocol without IV contrast. COMPARISON:  11/11/2014, 05/06/2014 FINDINGS: Lower chest: Cardiomegaly. Calcifications of mitral annulus. Pacing leads partially imaged. Nodule at the periphery of the left lung base and nodule at the right posterior lung base costophrenic sulcus, decreased in size from the comparison CT of 11/11/2014. These require no further follow-up given the reduction in size. Hepatobiliary: Unremarkable appearance of liver parenchyma. Unremarkable gallbladder. Pancreas: Fatty atrophy of pancreas parenchyma. Spleen: Normal spleen Adrenals/Urinary Tract: Unremarkable appearance of the adrenal glands. Redemonstration of nonobstructive right  nephrolithiasis, with stone  at the superior collecting system measuring 15 mm. No hydronephrosis. Unremarkable course of the right ureter. Left kidney with no hydronephrosis or nephrolithiasis. Unremarkable course of the left ureter. Urinary bladder relatively decompressed. Stomach/Bowel: Unremarkable appearance of stomach. Unremarkable small bowel with no abnormal distention. No transition point. No focal wall thickening identified. No inflammatory changes. Normal appendix. No abnormal distention of the colon. Colon is fluid-filled including cecum, ascending colon, transverse colon, descending colon. Colonic diverticula of the sigmoid colon. No focal inflammatory changes. Vascular/Lymphatic: Vascular calcifications of the abdominal aorta. No aneurysm. Calcifications of the bilateral iliac and proximal femoral vessels. Reproductive: Unremarkable appearance of the pelvic structures. Other: Gas within the anterior abdominal wall, likely injections. No inflammatory changes. Bilateral fact containing inguinal hernia Musculoskeletal: Osteopenia. No acute fracture. Degenerative changes of the thoracolumbar spine. IMPRESSION: No acute CT finding. Fluid filled colon, which is nonspecific though may reflect enteritis/colitis Diverticular disease without CT evidence of acute diverticulitis. Aortic Atherosclerosis (ICD10-I70.0). Nonobstructive right-sided nephrolithiasis. Electronically Signed   By: Corrie Mckusick D.O.   On: 04/11/2017 14:41    Procedures Procedures (including critical care time)  Medications Ordered in ED Medications  0.9 %  sodium chloride infusion ( Intravenous New Bag/Given 04/11/17 1358)  sodium chloride 0.9 % bolus 1,000 mL (0 mLs Intravenous Stopped 04/11/17 1523)  ondansetron (ZOFRAN) injection 4 mg (4 mg Intravenous Given 04/11/17 1328)     Initial Impression / Assessment and Plan / ED Course  I have reviewed the triage vital signs and the nursing notes.  Pertinent labs & imaging results  that were available during my care of the patient were reviewed by me and considered in my medical decision making (see chart for details).  Clinical Course as of Apr 11 1904  Tue Apr 11, 2017  1334 Potassium: (!) 6.0 [BM]  1334 WBC: 7.1 [BM]  1334 BUN: (!) 25 [BM]  1334 Labs reviewed showing slight hyperkalemia, this will need to be repeated after fluids.  Noncontrast CT ordered secondary to history of contrast allergy and renal insufficiency Creatinine: (!) 1.60 [BM]  1510 The CT scan does show diverticulosis though there is no signs of diverticulitis.  He does not have a leukocytosis, his renal function is close to baseline though it is elevated, the bilirubin is up at 1.6, he does not appear jaundice or have right upper quadrant tenderness.  Lipase is normal.  Urinalysis with no ketones.  These findings were discussed with the patient, C. difficile pending, the patient is not yet given a sample  [BM]    Clinical Course User Index [BM] Noemi Chapel, MD   There are multiple lab abnormalities including renal insufficiency, creatinine is bumped to 1.6, his urinalysis is pending, he does not have any urine at this time.  He has no leukocytosis, no anemia, normal lipase.  At this time the patient would benefit from a CT scan to further evaluate his bowel although I think he probably has some colitis.  He does not appear toxic, his heart rate is controlled, he is in atrial fibrillation and he is not hypotensive at this time.  IVF's Zofran CT abd  CT abdomen reassuring, labs show hyperkalemia has improved, creatinine is improved, BUN is 25, urinalysis with negative ketones but some protein, the patient was informed of his results, he is stable tolerating p.o. very well and has been unable to have any bowel movements while in the emergency department.  Certainly this makes a infectious colitis less likely.  He is aware that he  needs to follow-up closely and expressed his understanding.  Vitals:    04/11/17 1105  BP: 135/68  Pulse: 63  Resp: 20  Temp: 98.3 F (36.8 C)  SpO2: 97%     Final Clinical Impressions(s) / ED Diagnoses   Final diagnoses:  Diarrhea, unspecified type  Renal insufficiency  Hyperkalemia    ED Discharge Orders        Ordered    ondansetron (ZOFRAN ODT) 4 MG disintegrating tablet  Every 8 hours PRN     04/11/17 1905    loperamide (IMODIUM) 2 MG capsule  4 times daily PRN     04/11/17 1905       Noemi Chapel, MD 04/11/17 1906

## 2017-04-11 NOTE — ED Notes (Signed)
Pt not able to give a stool sample.

## 2017-04-11 NOTE — ED Notes (Signed)
Pt now states he might be able to give a stool sample. Am putting a bedside commode in room. Have notified pt. To not get any urine in stool.

## 2017-04-11 NOTE — Discharge Instructions (Signed)
In the next 48 hours she will need to see her family doctor at the office to have your blood rechecked including your potassium level and your kidney function. Zofran as needed for nausea, drink plenty of clear fluids Please obtain a stool sample and bring it to your doctor's office to be tested for C. difficile

## 2017-04-11 NOTE — ED Notes (Signed)
Pt to CT

## 2017-04-11 NOTE — ED Notes (Signed)
Pt tolerated PO trial well. Drank 8oz of liquid and states he feels much better. No need to poop anymore.

## 2017-04-14 ENCOUNTER — Encounter: Payer: Medicare Other | Admitting: Internal Medicine

## 2017-04-17 ENCOUNTER — Ambulatory Visit (INDEPENDENT_AMBULATORY_CARE_PROVIDER_SITE_OTHER): Payer: Medicare Other | Admitting: *Deleted

## 2017-04-17 DIAGNOSIS — I442 Atrioventricular block, complete: Secondary | ICD-10-CM | POA: Diagnosis not present

## 2017-04-17 NOTE — Progress Notes (Signed)
Remote pacemaker transmission.   

## 2017-04-18 ENCOUNTER — Encounter: Payer: Self-pay | Admitting: Cardiology

## 2017-04-27 ENCOUNTER — Encounter: Payer: Self-pay | Admitting: Internal Medicine

## 2017-04-27 ENCOUNTER — Ambulatory Visit: Payer: Medicare Other | Admitting: Internal Medicine

## 2017-04-27 VITALS — BP 130/78 | HR 86 | Ht 72.0 in | Wt 196.0 lb

## 2017-04-27 DIAGNOSIS — I442 Atrioventricular block, complete: Secondary | ICD-10-CM

## 2017-04-27 DIAGNOSIS — I4891 Unspecified atrial fibrillation: Secondary | ICD-10-CM | POA: Diagnosis not present

## 2017-04-27 NOTE — Progress Notes (Signed)
PCP: Monico Blitz, MD Primary Cardiologist:  Dr Domenic Polite Primary EP:  Dr Rayann Heman  Jay Campbell is a 72 y.o. male who presents today for routine electrophysiology followup.  Since last being seen in our clinic, the patient reports doing very well.  He has occasional postural dizziness. Today, he denies symptoms of palpitations, chest pain, shortness of breath,  lower extremity edema,  or syncope.  The patient is otherwise without complaint today.   Past Medical History:  Diagnosis Date  . Anxiety   . Atrial fibrillation ([place])    Permanent  . Coronary artery disease    Mild nonobstructive 1/12  . DM (diabetes mellitus) ([place])   . GERD (gastroesophageal reflux disease)   . Gout   . Hearing disorder, cochlear   . Hiatal hernia   . HLD (hyperlipidemia)   . HTN (hypertension)   . S/P AV nodal ablation    s/p SJM PPM; gen change 02-01-13 by Dr Rayann Heman  . Warfarin anticoagulation    Past Surgical History:  Procedure Laterality Date  . CATARACT EXTRACTION Right   . COCHLEAR IMPLANT Right   . HEMORRHOID BANDING    . PACEMAKER GENERATOR CHANGE Bilateral 02/01/2013   Procedure: PACEMAKER GENERATOR CHANGE;  Surgeon: Coralyn Mark, MD;  Location: [facility];  Service: Cardiovascular;  Laterality: Bilateral;  . PACEMAKER PLACEMENT  2002; 2014   SJM implanted by Dr Lovena Le with AV nodal ablation performed; gen change to Accent SR RF by Dr Rayann Heman 02-01-13  . RETINAL LASER PROCEDURE Left   . SLT LASER APPLICATION Left 53/08/1441   Procedure: SLT LASER APPLICATION;  Surgeon: Williams Che, MD;  Location: [facility];  Service: Ophthalmology;  Laterality: Left;    ROS- all systems are reviewed and negative except as per HPI above  Current Outpatient Medications  Medication Sig Dispense Refill  . allopurinol (ZYLOPRIM) 100 MG tablet Take 100 mg by mouth Daily.     . clonazePAM (KLONOPIN) 0.5 MG tablet Take 0.5 mg by mouth daily.     . fish oil-omega-3 fatty acids 1000 MG capsule Take 1 g  by mouth 2 (two) times daily.      Marland Kitchen HUMULIN 70/30 (70-30) 100 UNIT/ML injection Inject 30 Units into the skin 2 (two) times daily with a meal.     . isosorbide mononitrate (IMDUR) 60 MG 24 hr tablet Take 120 mg by mouth daily.    Marland Kitchen loperamide (IMODIUM) 2 MG capsule Take 1 capsule (2 mg total) by mouth 4 (four) times daily as needed for diarrhea or loose stools. 12 capsule 0  . metFORMIN (GLUCOPHAGE) 1000 MG tablet Take 1,000 mg by mouth 2 (two) times daily with a meal.      . metoprolol succinate (TOPROL-XL) 25 MG 24 hr tablet Take 0.5 tablets by mouth Daily.    . nitroGLYCERIN (NITROSTAT) 0.4 MG SL tablet Place 1 tablet (0.4 mg total) under the tongue every 5 (five) minutes x 3 doses as needed. 25 tablet 3  . ondansetron (ZOFRAN ODT) 4 MG disintegrating tablet Take 1 tablet (4 mg total) by mouth every 8 (eight) hours as needed for nausea. 10 tablet 0  . ramipril (ALTACE) 5 MG capsule Take 5 mg by mouth daily.      . ranitidine (ZANTAC) 150 MG tablet Take 1 tablet by mouth 2 (two) times daily.    . sertraline (ZOLOFT) 50 MG tablet Take 50 mg by mouth daily. Take 1  tab daily    . simvastatin (  ZOCOR) 40 MG tablet Take 40 mg by mouth at bedtime.      Marland Kitchen warfarin (COUMADIN) 5 MG tablet Take 5-7.5 mg by mouth daily. 7.5 mg on Monday and Friday and 5 mg all other days (MANAGED BY PMD)     No current facility-administered medications for this visit.     Physical Exam: Vitals:   04/27/17 1600  BP: 130/78  Pulse: 86  SpO2: 98%  Weight: 196 lb (88.9 kg)  Height: 6' (1.829 m)    GEN- The patient is well appearing, alert and oriented x 3 today.   Head- normocephalic, atraumatic Eyes-  Sclera clear, conjunctiva pink Ears- hearing intact Oropharynx- clear Lungs- Clear to ausculation bilaterally, normal work of breathing Chest- pacemaker pocket is well healed Heart- Regular rate and rhythm, no murmurs, rubs or gallops, PMI not laterally displaced GI- soft, NT, ND, + BS Extremities- no  clubbing, cyanosis, or edema  Pacemaker interrogation- reviewed in detail today,  See PACEART report  ekg tracing ordered today is personally reviewed and shows afib, V pacing  Assessment and Plan:  1. Symptomatic complete heart block s/p prior AV nodal ablation Normal pacemaker function See Pace Art report No changes today  2. Permanent atrial fibrillation Continue long term anticoagulation  3. Postural dizziness Adequate hydration encouraged May have to reduce imdur if symptoms persist Follow-up with PCP if not improved.  Merlin Return to see me in a year  Thompson Grayer MD, [facility] 04/27/2017 4:04 PM

## 2017-04-27 NOTE — Patient Instructions (Signed)
Your physician recommends that you schedule a follow-up appointment in: 1 YEAR WITH DR ALLRED  Your physician recommends that you continue on your current medications as directed. Please refer to the Current Medication list given to you today.  Thank you for choosing [facility]!!    

## 2017-05-05 LAB — CUP PACEART INCLINIC DEVICE CHECK
Implantable Lead Implant Date: 20020215
Implantable Lead Location: 753860
Implantable Pulse Generator Implant Date: 20141121
MDC IDC SESS DTM: 20190222140409
Pulse Gen Serial Number: 7488472

## 2017-05-13 LAB — CUP PACEART REMOTE DEVICE CHECK
Battery Voltage: 3.01 V
Date Time Interrogation Session: 20190204070014
Implantable Lead Location: 753860
Implantable Pulse Generator Implant Date: 20141121
Lead Channel Impedance Value: 640 Ohm
Lead Channel Pacing Threshold Amplitude: 1.125 V
Lead Channel Pacing Threshold Pulse Width: 0.4 ms
Lead Channel Setting Pacing Pulse Width: 0.4 ms
MDC IDC LEAD IMPLANT DT: 20020215
MDC IDC MSMT BATTERY REMAINING LONGEVITY: 142 mo
MDC IDC MSMT BATTERY REMAINING PERCENTAGE: 95.5 %
MDC IDC MSMT LEADCHNL RV SENSING INTR AMPL: 12 mV
MDC IDC SET LEADCHNL RV PACING AMPLITUDE: 1.375
MDC IDC SET LEADCHNL RV SENSING SENSITIVITY: 6 mV
MDC IDC STAT BRADY RV PERCENT PACED: 99 %
Pulse Gen Model: 1240
Pulse Gen Serial Number: 7488472

## 2017-07-13 ENCOUNTER — Inpatient Hospital Stay (HOSPITAL_COMMUNITY): Payer: Medicare Other | Admitting: Hematology

## 2017-07-13 ENCOUNTER — Encounter (HOSPITAL_COMMUNITY): Payer: Self-pay

## 2017-07-17 ENCOUNTER — Ambulatory Visit (INDEPENDENT_AMBULATORY_CARE_PROVIDER_SITE_OTHER): Payer: Medicare Other | Admitting: *Deleted

## 2017-07-17 DIAGNOSIS — I442 Atrioventricular block, complete: Secondary | ICD-10-CM

## 2017-07-18 NOTE — Progress Notes (Signed)
Remote pacemaker transmission.   

## 2017-07-19 ENCOUNTER — Encounter: Payer: Self-pay | Admitting: Cardiology

## 2017-07-31 LAB — CUP PACEART REMOTE DEVICE CHECK
Battery Remaining Longevity: 139 mo
Battery Remaining Percentage: 95.5 %
Battery Voltage: 3.01 V
Brady Statistic RV Percent Paced: 99 %
Implantable Lead Implant Date: 20020215
Implantable Lead Location: 753860
Lead Channel Sensing Intrinsic Amplitude: 12 mV
Lead Channel Setting Pacing Amplitude: 1.75 V
Lead Channel Setting Pacing Pulse Width: 0.4 ms
Lead Channel Setting Sensing Sensitivity: 6 mV
MDC IDC MSMT LEADCHNL RV IMPEDANCE VALUE: 600 Ohm
MDC IDC MSMT LEADCHNL RV PACING THRESHOLD AMPLITUDE: 1.5 V
MDC IDC MSMT LEADCHNL RV PACING THRESHOLD PULSEWIDTH: 0.4 ms
MDC IDC PG IMPLANT DT: 20141121
MDC IDC PG SERIAL: 7488472
MDC IDC SESS DTM: 20190506060016

## 2017-08-01 ENCOUNTER — Ambulatory Visit (HOSPITAL_COMMUNITY): Payer: Medicare Other | Admitting: Internal Medicine

## 2017-08-03 ENCOUNTER — Encounter (HOSPITAL_COMMUNITY): Payer: Self-pay | Admitting: Internal Medicine

## 2017-08-03 ENCOUNTER — Inpatient Hospital Stay (HOSPITAL_COMMUNITY): Payer: Medicare Other | Attending: Hematology | Admitting: Internal Medicine

## 2017-08-03 ENCOUNTER — Inpatient Hospital Stay (HOSPITAL_COMMUNITY): Payer: Medicare Other

## 2017-08-03 ENCOUNTER — Other Ambulatory Visit: Payer: Self-pay

## 2017-08-03 VITALS — BP 144/64 | HR 60 | Temp 98.1°F | Resp 18 | Ht 72.0 in | Wt 199.4 lb

## 2017-08-03 DIAGNOSIS — N289 Disorder of kidney and ureter, unspecified: Secondary | ICD-10-CM | POA: Diagnosis not present

## 2017-08-03 DIAGNOSIS — I10 Essential (primary) hypertension: Secondary | ICD-10-CM | POA: Insufficient documentation

## 2017-08-03 DIAGNOSIS — D539 Nutritional anemia, unspecified: Secondary | ICD-10-CM | POA: Diagnosis present

## 2017-08-03 DIAGNOSIS — Z7901 Long term (current) use of anticoagulants: Secondary | ICD-10-CM | POA: Diagnosis not present

## 2017-08-03 DIAGNOSIS — I2510 Atherosclerotic heart disease of native coronary artery without angina pectoris: Secondary | ICD-10-CM | POA: Insufficient documentation

## 2017-08-03 DIAGNOSIS — D508 Other iron deficiency anemias: Secondary | ICD-10-CM

## 2017-08-03 DIAGNOSIS — E119 Type 2 diabetes mellitus without complications: Secondary | ICD-10-CM | POA: Insufficient documentation

## 2017-08-03 DIAGNOSIS — Z79899 Other long term (current) drug therapy: Secondary | ICD-10-CM | POA: Insufficient documentation

## 2017-08-03 DIAGNOSIS — Z7984 Long term (current) use of oral hypoglycemic drugs: Secondary | ICD-10-CM | POA: Diagnosis not present

## 2017-08-03 LAB — CBC WITH DIFFERENTIAL/PLATELET
BASOS ABS: 0 10*3/uL (ref 0.0–0.1)
Basophils Relative: 1 %
EOS ABS: 0.1 10*3/uL (ref 0.0–0.7)
Eosinophils Relative: 3 %
HCT: 35.9 % — ABNORMAL LOW (ref 39.0–52.0)
HEMOGLOBIN: 12.2 g/dL — AB (ref 13.0–17.0)
LYMPHS ABS: 1.3 10*3/uL (ref 0.7–4.0)
LYMPHS PCT: 30 %
MCH: 35.1 pg — AB (ref 26.0–34.0)
MCHC: 34 g/dL (ref 30.0–36.0)
MCV: 103.2 fL — ABNORMAL HIGH (ref 78.0–100.0)
Monocytes Absolute: 0.3 10*3/uL (ref 0.1–1.0)
Monocytes Relative: 8 %
NEUTROS PCT: 58 %
Neutro Abs: 2.6 10*3/uL (ref 1.7–7.7)
PLATELETS: 109 10*3/uL — AB (ref 150–400)
RBC: 3.48 MIL/uL — AB (ref 4.22–5.81)
RDW: 12.9 % (ref 11.5–15.5)
WBC: 4.4 10*3/uL (ref 4.0–10.5)

## 2017-08-03 LAB — COMPREHENSIVE METABOLIC PANEL
ALT: 30 U/L (ref 17–63)
ANION GAP: 7 (ref 5–15)
AST: 33 U/L (ref 15–41)
Albumin: 3.8 g/dL (ref 3.5–5.0)
Alkaline Phosphatase: 99 U/L (ref 38–126)
BUN: 22 mg/dL — AB (ref 6–20)
CHLORIDE: 106 mmol/L (ref 101–111)
CO2: 24 mmol/L (ref 22–32)
Calcium: 9.2 mg/dL (ref 8.9–10.3)
Creatinine, Ser: 1.06 mg/dL (ref 0.61–1.24)
GFR calc Af Amer: >60 mL/min (ref >60)
GFR calc non Af Amer: >60 mL/min (ref >60)
GLUCOSE: 130 mg/dL — AB (ref 65–99)
Potassium: 4.9 mmol/L (ref 3.5–5.1)
SODIUM: 137 mmol/L (ref 135–145)
TOTAL PROTEIN: 7.9 g/dL (ref 6.5–8.1)
Total Bilirubin: 1 mg/dL (ref 0.3–1.2)

## 2017-08-03 LAB — LACTATE DEHYDROGENASE: LDH: 126 U/L (ref 98–192)

## 2017-08-03 NOTE — Progress Notes (Signed)
Referring physician:  Dr. Chalmers Cater Diagnosis Other iron deficiency anemia - Plan: CBC with Differential/Platelet, Comprehensive metabolic panel, Lactate dehydrogenase, Protein electrophoresis, serum, Ferritin, Vitamin B12, Folate, Methylmalonic acid(mma), rnd urine, Haptoglobin, IgG, IgA, IgM, Kappa/lambda light chains, CANCELED: IgG, IgA, IgM, CANCELED: Kappa/lambda light chains  Staging Cancer Staging No matching staging information was found for the patient.  Assessment and Plan: 1.  Macrocytic anemia.  Labs performed today white count 4.4 hemoglobin 12.2 hematocrit 35.9 platelets 109,000 MCV is 103.  He has a normal differential.  Creatinine is 1.06, LDH 126.  Liver function tests are normal.  Awaiting the results of his B12, folate, methylmalonic acid, haptoglobin, SPEP, ferritin.  He will return to clinic in 2 weeks for follow-up to go over labs.  All questions answered and patient expressed understanding of the information presented.  2.  Thrombocytopenia.  Platelet count slightly decreased at 109,000.  He has a normal differential.  His LDH is normal at 126.  Creatinine is normal at 1.06.  Labs done January 2019 showed a slightly decreased platelet count of 149,000.  We will repeat labs for interval follow-up on return to clinic.    3.  Monoclonal protein.  SPEP, free light chains, quantitative immunoglobulins are pending.  Creatinine is normal 1.06 calcium is within normal limits.  He will return to clinic to go over labs.  4.  Renal insufficiency.  Labs that were done in January 2019 showed creatinine of 1.6.  Labs performed today in clinic showed a creatinine of 1.06.  Awaiting results of SPEP.  5.  Hypertension.  Blood pressure is 144/64.  Follow-up with PCP.  6.  Diabetes.  Tina follow-up with PCP.  7.  Coronary artery disease and pacemaker.  Patient is on warfarin.  Continue INR monitoring to PCP.  8.  Trouble swallowing.  Will recommend GI evaluation if symptoms persist on return to  clinic.  HPI: 72 year old male who was referred by Dr. Chalmers Cater for anemia.  Review of chart shows patient had labs done April 11, 2017 that showed white count 7.1 hemoglobin 14.9 platelets 149,000.  His MCV was 105.  Chemistries were within normal limits other than a potassium of 6 his creatinine was 1.60, liver function tests were normal calcium was normal at 9.7.  There was reported history of monoclonal protein.  Patient denies any blood in his stool or his urine. He reportedly had a colonoscopy done in 2017 that showed no polyps.  He denies any history of blood transfusion.  Wife reports she has concerns about him being on metformin due to potential kidney effects with that medication.  Patient is seen today for consultation due to anemia.  Problem List Patient Active Problem List   Diagnosis Date Noted  . Precordial pain [R07.2] 03/20/2014  . Complete heart block ([place]) [I44.2] 02/01/2013  . S/P AV nodal ablation [Z98.890]   . Warfarin anticoagulation [Z79.01]   . CORONARY ATHEROSCLEROSIS NATIVE CORONARY ARTERY [I25.10] 02/01/2008  . Permanent atrial fibrillation ([place]) [I48.2] 02/01/2008  . Cardiac pacemaker in situ [Z95.0] 02/01/2008    Past Medical History Past Medical History:  Diagnosis Date  . Anxiety   . Atrial fibrillation ([place])    Permanent  . Coronary artery disease    Mild nonobstructive 1/12  . DM (diabetes mellitus) ([place])   . GERD (gastroesophageal reflux disease)   . Gout   . Hearing disorder, cochlear   . Hiatal hernia   . HLD (hyperlipidemia)   . HTN (hypertension)   . S/P AV  nodal ablation    s/p SJM PPM; gen change 02-01-13 by Dr Rayann Heman  . Warfarin anticoagulation     Past Surgical History Past Surgical History:  Procedure Laterality Date  . CATARACT EXTRACTION Right   . COCHLEAR IMPLANT Right   . HEMORRHOID BANDING    . PACEMAKER GENERATOR CHANGE Bilateral 02/01/2013   Procedure: PACEMAKER GENERATOR CHANGE;  Surgeon: Coralyn Mark, MD;  Location: [facility]  CATH LAB;  Service: Cardiovascular;  Laterality: Bilateral;  . PACEMAKER PLACEMENT  2002; 2014   SJM implanted by Dr Lovena Le with AV nodal ablation performed; gen change to [employer] SR RF by Dr Rayann Heman 02-01-13  . RETINAL LASER PROCEDURE Left   . SLT LASER APPLICATION Left 48/04/5001   Procedure: SLT LASER APPLICATION;  Surgeon: Williams Che, MD;  Location: [facility];  Service: Ophthalmology;  Laterality: Left;    Family History Family History  Problem Relation Age of Onset  . COPD Mother   . Emphysema Mother   . Cancer - Lung Father   . Heart disease Paternal Uncle   . Stomach cancer Neg Hx   . Colon cancer Neg Hx      Social History  reports that he has never smoked. He has never used smokeless tobacco. He reports that he does not drink alcohol or use drugs.  Medications  Current Outpatient Medications:  .  allopurinol (ZYLOPRIM) 100 MG tablet, Take 100 mg by mouth Daily. , Disp: , Rfl:  .  clonazePAM (KLONOPIN) 0.5 MG tablet, Take 0.5 mg by mouth daily. , Disp: , Rfl:  .  fish oil-omega-3 fatty acids 1000 MG capsule, Take 1 g by mouth 2 (two) times daily.  , Disp: , Rfl:  .  HUMULIN 70/30 (70-30) 100 UNIT/ML injection, Inject 30 Units into the skin 2 (two) times daily with a meal. , Disp: , Rfl:  .  isosorbide mononitrate (IMDUR) 60 MG 24 hr tablet, Take 120 mg by mouth daily., Disp: , Rfl:  .  loperamide (IMODIUM) 2 MG capsule, Take 1 capsule (2 mg total) by mouth 4 (four) times daily as needed for diarrhea or loose stools., Disp: 12 capsule, Rfl: 0 .  metFORMIN (GLUCOPHAGE) 1000 MG tablet, Take 1,000 mg by mouth 2 (two) times daily with a meal.  , Disp: , Rfl:  .  metoprolol succinate (TOPROL-XL) 25 MG 24 hr tablet, Take 0.5 tablets by mouth Daily., Disp: , Rfl:  .  nitroGLYCERIN (NITROSTAT) 0.4 MG SL tablet, Place 1 tablet (0.4 mg total) under the tongue every 5 (five) minutes x 3 doses as needed., Disp: 25 tablet, Rfl: 3 .  ondansetron (ZOFRAN ODT) 4 MG disintegrating tablet,  Take 1 tablet (4 mg total) by mouth every 8 (eight) hours as needed for nausea., Disp: 10 tablet, Rfl: 0 .  ramipril (ALTACE) 5 MG capsule, Take 5 mg by mouth daily.  , Disp: , Rfl:  .  ranitidine (ZANTAC) 150 MG tablet, Take 1 tablet by mouth 2 (two) times daily., Disp: , Rfl:  .  sertraline (ZOLOFT) 50 MG tablet, Take 50 mg by mouth daily. Take 1  tab daily, Disp: , Rfl:  .  simvastatin (ZOCOR) 40 MG tablet, Take 40 mg by mouth at bedtime.  , Disp: , Rfl:  .  warfarin (COUMADIN) 5 MG tablet, Take 5-7.5 mg by mouth daily. 7.5 mg on Monday and Friday and 5 mg all other days (MANAGED BY PMD), Disp: , Rfl:   Allergies Contrast media [iodinated diagnostic agents]; Shellfish allergy;  and Sulfonamide derivatives  Review of Systems Review of Systems - Oncology ROS as per HPI otherwise 12 point ROS is negativeother than trouble swallowing.   Physical Exam  Vitals Wt Readings from Last 3 Encounters:  08/03/17 199 lb 6.4 oz (90.4 kg)  04/27/17 196 lb (88.9 kg)  02/09/17 203 lb (92.1 kg)   Temp Readings from Last 3 Encounters:  08/03/17 98.1 F (36.7 C) (Oral)  04/11/17 98.3 F (36.8 C)  12/05/16 (!) 97.1 F (36.2 C)   BP Readings from Last 3 Encounters:  08/03/17 (!) 144/64  04/27/17 130/78  04/11/17 131/72   Pulse Readings from Last 3 Encounters:  08/03/17 60  04/27/17 86  04/11/17 64    Constitutional: Well-developed, well-nourished, and in no distress.   HENT: Head: Normocephalic and atraumatic.  Mouth/Throat: No oropharyngeal exudate. Mucosa moist. Eyes: Pupils are equal, round, and reactive to light. Conjunctivae are normal. No scleral icterus.  Neck: Normal range of motion. Neck supple. No JVD present.  Cardiovascular: Normal rate, regular rhythm and normal heart sounds.  Exam reveals no gallop and no friction rub.   No murmur heard. Pulmonary/Chest: Effort normal and breath sounds normal. No respiratory distress. No wheezes.No rales.  Abdominal: Soft. Bowel sounds are  normal. No distension. There is no tenderness. There is no guarding.  Musculoskeletal: No edema or tenderness.  Lymphadenopathy: No cervical, axillary or supraclavicular adenopathy.  Neurological: Alert and oriented to person, place, and time. No cranial nerve deficit.  Skin: Skin is warm and dry. No rash noted. No erythema. No pallor.  Psychiatric: Affect and judgment normal.   Labs Appointment on 08/03/2017  Component Date Value Ref Range Status  . WBC 08/03/2017 4.4  4.0 - 10.5 K/uL Final  . RBC 08/03/2017 3.48* 4.22 - 5.81 MIL/uL Final  . Hemoglobin 08/03/2017 12.2* 13.0 - 17.0 g/dL Final  . HCT 08/03/2017 35.9* 39.0 - 52.0 % Final  . MCV 08/03/2017 103.2* 78.0 - 100.0 fL Final  . MCH 08/03/2017 35.1* 26.0 - 34.0 pg Final  . MCHC 08/03/2017 34.0  30.0 - 36.0 g/dL Final  . RDW 08/03/2017 12.9  11.5 - 15.5 % Final  . Platelets 08/03/2017 109* 150 - 400 K/uL Final   Comment: SPECIMEN CHECKED FOR CLOTS PLATELET COUNT CONFIRMED BY SMEAR   . Neutrophils Relative % 08/03/2017 58  % Final  . Neutro Abs 08/03/2017 2.6  1.7 - 7.7 K/uL Final  . Lymphocytes Relative 08/03/2017 30  % Final  . Lymphs Abs 08/03/2017 1.3  0.7 - 4.0 K/uL Final  . Monocytes Relative 08/03/2017 8  % Final  . Monocytes Absolute 08/03/2017 0.3  0.1 - 1.0 K/uL Final  . Eosinophils Relative 08/03/2017 3  % Final  . Eosinophils Absolute 08/03/2017 0.1  0.0 - 0.7 K/uL Final  . Basophils Relative 08/03/2017 1  % Final  . Basophils Absolute 08/03/2017 0.0  0.0 - 0.1 K/uL Final   Performed at [facility], [address], [place], [place] [address]  . Sodium 08/03/2017 137  135 - 145 mmol/L Final  . Potassium 08/03/2017 4.9  3.5 - 5.1 mmol/L Final  . Chloride 08/03/2017 106  101 - 111 mmol/L Final  . CO2 08/03/2017 24  22 - 32 mmol/L Final  . Glucose, Bld 08/03/2017 130* 65 - 99 mg/dL Final  . BUN 08/03/2017 22* 6 - 20 mg/dL Final  . Creatinine, Ser 08/03/2017 1.06  0.61 - 1.24 mg/dL Final  . Calcium 08/03/2017 9.2   8.9 - 10.3 mg/dL Final  .  Total Protein 08/03/2017 7.9  6.5 - 8.1 g/dL Final  . Albumin 08/03/2017 3.8  3.5 - 5.0 g/dL Final  . AST 08/03/2017 33  15 - 41 U/L Final  . ALT 08/03/2017 30  17 - 63 U/L Final  . Alkaline Phosphatase 08/03/2017 99  38 - 126 U/L Final  . Total Bilirubin 08/03/2017 1.0  0.3 - 1.2 mg/dL Final  . GFR calc non Af Amer 08/03/2017 >60  >60 mL/min Final  . GFR calc Af Amer 08/03/2017 >60  >60 mL/min Final   Comment: (NOTE) The eGFR has been calculated using the CKD EPI equation. This calculation has not been validated in all clinical situations. eGFR's persistently <60 mL/min signify possible Chronic Kidney Disease.   Georgiann Hahn gap 08/03/2017 7  5 - 15 Final   Performed at [facility], [address] [place], [place] [address]  . LDH 08/03/2017 126  98 - 192 U/L Final   Performed at [facility], [address] [place], [place] [address]     Pathology Orders Placed This Encounter  Procedures  . CBC with Differential/Platelet    Standing Status:   Future    Number of Occurrences:   1    Standing Expiration Date:   08/04/2018  . Comprehensive metabolic panel    Standing Status:   Future    Number of Occurrences:   1    Standing Expiration Date:   08/04/2018  . Lactate dehydrogenase    Standing Status:   Future    Number of Occurrences:   1    Standing Expiration Date:   08/04/2018  . Protein electrophoresis, serum    Standing Status:   Future    Number of Occurrences:   1    Standing Expiration Date:   08/04/2018  . Ferritin    Standing Status:   Future    Number of Occurrences:   1    Standing Expiration Date:   08/04/2018  . Vitamin B12    Standing Status:   Future    Number of Occurrences:   1    Standing Expiration Date:   08/04/2018  . Folate    Standing Status:   Future    Number of Occurrences:   1    Standing Expiration Date:   08/04/2018  . Methylmalonic acid(mma), rnd urine    Standing Status:   Future    Number of Occurrences:   1     Standing Expiration Date:   08/04/2018  . Haptoglobin    Standing Status:   Future    Number of Occurrences:   1    Standing Expiration Date:   08/04/2018  . IgG, IgA, IgM    Standing Status:   Future    Standing Expiration Date:   08/04/2018  . Kappa/lambda light chains    Standing Status:   Future    Standing Expiration Date:   08/04/2018       Zoila Shutter MD

## 2017-08-03 NOTE — Patient Instructions (Signed)
[facility] Discharge Instructions  Today you saw Dr. Higgs.    Thank you for choosing [facility] at [facility] to provide your oncology and hematology care.  To afford each patient quality time with our provider, please arrive at least 15 minutes before your scheduled appointment time.   If you have a lab appointment with the Cancer Center please come in thru the  Main Entrance and check in at the main information desk  You need to re-schedule your appointment should you arrive 10 or more minutes late.  We strive to give you quality time with our providers, and arriving late affects you and other patients whose appointments are after yours.  Also, if you no show three or more times for appointments you may be dismissed from the clinic at the providers discretion.     Again, thank you for choosing [facility].  Our hope is that these requests will decrease the amount of time that you wait before being seen by our physicians.       _____________________________________________________________  Should you have questions after your visit to [facility], please contact our office at [phone] between the hours of 8:30 a.m. and 4:30 p.m.  Voicemails left after 4:30 p.m. will not be returned until the following business day.  For prescription refill requests, have your pharmacy contact our office.       Resources For Cancer Patients and their Caregivers ? American Cancer Society: Can assist with transportation, wigs, general needs, runs Look Good Feel Better.        1-888-227-6333 ? Cancer Care: Provides financial assistance, online support groups, medication/co-pay assistance.  1-800-813-HOPE (4673) ? [facility] Assists [place] cancer patients and their families through emotional , educational and financial support.  336-427-4357 ? [place] DSS Where to apply for  food stamps, Medicaid and utility assistance. 336-342-1394 ? [facility]: Transportation to medical appointments. 336-347-2287 ? Social Security Administration: May apply for disability if have a Stage IV cancer. 336-342-7796 1-800-772-1213 ? [place] Aging, Disability and Transit Services: Assists with nutrition, care and transit needs. 336-349-2343  Cancer Center Support Programs:   > Cancer Support Group  2nd Tuesday of the month 1pm-2pm, Journey Room   > Creative Journey  3rd Tuesday of the month 1130am-1pm, Journey Room    

## 2017-08-04 LAB — FERRITIN: Ferritin: 78 ng/mL (ref 24–336)

## 2017-08-04 LAB — HAPTOGLOBIN: HAPTOGLOBIN: 82 mg/dL (ref 34–200)

## 2017-08-04 LAB — FOLATE: FOLATE: 33 ng/mL (ref >5.9)

## 2017-08-04 LAB — VITAMIN B12: VITAMIN B 12: 235 pg/mL (ref 180–914)

## 2017-08-08 LAB — PROTEIN ELECTROPHORESIS, SERUM
A/G RATIO SPE: 1 (ref 0.7–1.7)
ALBUMIN ELP: 3.8 g/dL (ref 2.9–4.4)
Alpha-1-Globulin: 0.2 g/dL (ref 0.0–0.4)
Alpha-2-Globulin: 0.7 g/dL (ref 0.4–1.0)
Beta Globulin: 0.9 g/dL (ref 0.7–1.3)
GAMMA GLOBULIN: 2 g/dL — AB (ref 0.4–1.8)
GLOBULIN, TOTAL: 3.8 g/dL (ref 2.2–3.9)
M-Spike, %: 1.6 g/dL — ABNORMAL HIGH
TOTAL PROTEIN ELP: 7.6 g/dL (ref 6.0–8.5)

## 2017-08-08 LAB — METHYLMALONIC ACID(MMA), RND URINE
CREATININE(CRT), U: 2.1 g/L (ref 0.30–3.00)
METHYLMALONIC ACID UR: 56.9 umol/L — AB (ref 1.6–29.7)
MMA - NORMALIZED: 3.1 umol/mmol{creat} — AB (ref 0.3–2.8)

## 2017-08-17 ENCOUNTER — Encounter (HOSPITAL_COMMUNITY): Payer: Self-pay | Admitting: Internal Medicine

## 2017-08-17 ENCOUNTER — Other Ambulatory Visit: Payer: Self-pay

## 2017-08-17 ENCOUNTER — Ambulatory Visit (HOSPITAL_COMMUNITY)
Admission: RE | Admit: 2017-08-17 | Discharge: 2017-08-17 | Disposition: A | Discharge status: Home / Self Care | Payer: Medicare Other | Source: Ambulatory Visit | Attending: Internal Medicine | Admitting: Internal Medicine

## 2017-08-17 ENCOUNTER — Inpatient Hospital Stay (HOSPITAL_COMMUNITY): Payer: Medicare Other | Attending: Hematology | Admitting: Internal Medicine

## 2017-08-17 VITALS — BP 144/66 | HR 70 | Resp 16 | Wt 199.5 lb

## 2017-08-17 DIAGNOSIS — I10 Essential (primary) hypertension: Secondary | ICD-10-CM | POA: Diagnosis not present

## 2017-08-17 DIAGNOSIS — D539 Nutritional anemia, unspecified: Secondary | ICD-10-CM | POA: Diagnosis not present

## 2017-08-17 DIAGNOSIS — E119 Type 2 diabetes mellitus without complications: Secondary | ICD-10-CM

## 2017-08-17 DIAGNOSIS — D472 Monoclonal gammopathy: Secondary | ICD-10-CM | POA: Diagnosis not present

## 2017-08-17 DIAGNOSIS — R1310 Dysphagia, unspecified: Secondary | ICD-10-CM | POA: Diagnosis not present

## 2017-08-17 DIAGNOSIS — N289 Disorder of kidney and ureter, unspecified: Secondary | ICD-10-CM

## 2017-08-17 DIAGNOSIS — D696 Thrombocytopenia, unspecified: Secondary | ICD-10-CM | POA: Diagnosis not present

## 2017-08-17 DIAGNOSIS — Z7901 Long term (current) use of anticoagulants: Secondary | ICD-10-CM | POA: Insufficient documentation

## 2017-08-17 DIAGNOSIS — I2510 Atherosclerotic heart disease of native coronary artery without angina pectoris: Secondary | ICD-10-CM | POA: Diagnosis not present

## 2017-08-17 NOTE — Progress Notes (Signed)
Diagnosis Monoclonal gammopathy - Plan: DG Bone Survey Met, CT BONE MARROW BIOPSY & ASPIRATION, CT Biopsy, CBC with Differential/Platelet, Comprehensive metabolic panel, Lactate dehydrogenase, Kappa/lambda light chains, IgG, IgA, IgM, Beta 2 microglobulin, serum  Staging Cancer Staging No matching staging information was found for the patient.  Assessment and Plan:  1. MGUS and Macrocytic anemia.  Labs performed 08/03/2017 reviewed with pt and family today and showed  white count 4.4 hemoglobin 12.2 hematocrit 35.9 platelets 109,000 MCV is 103.  He has a normal differential.  Creatinine is 1.06, LDH 126.  Liver function tests are normal.  He has normal B12 of 235,  Normal folate, haptoglobin.  SPEP shows monoclonal protein measuring 1.6 g/dl.  Ferritin normal at 78.  I discussed with him option of bone marrow biopsy for definitive diagnosis due to monoclonal gammopathy as well as macrocytosis and thrombocytopenia.  Pt will RTC in 2 weeks to go over results.  All questions answered and patient expressed understanding of the information presented.  2.  Thrombocytopenia.  Platelet count slightly decreased at 109,000.  He has a normal differential.  His LDH is normal at 126.  Creatinine is normal at 1.06.  Labs done January 2019 showed a slightly decreased platelet count of 149,000.  Review of chart shows pt had low platelets dating back to 2016 with a platelet count of 102,000.  He is set up for bone marrow biopsy due to macrocytosis.  He will RTC in 2 weeks to go over results.  We will repeat labs for interval follow-up on return to clinic.    3.  Renal insufficiency.  Labs that were done in January 2019 showed creatinine of 1.6.  Labs performed 08/03/2017 showed a creatinine of 1.06.  SPEP showed monoclonal protein measuring 1.6 g/dl.  He is set up for bone marrow biopsy.    4.  Hypertension.  Blood pressure is 144/66.  Follow-up with PCP.  5.  Diabetes.  Follow-up with PCP.  6.  Coronary artery  disease and pacemaker.  Patient is on warfarin.  Continue INR monitoring to PCP.  I discussed with him he will be given instructions regarding coumadin management prior to bone marrow biopsy.    7.  Trouble swallowing.  Pt continues to reports problems swallowing liquids.  He is referred to GI for evaluation.    Interval history:  72-year-old male who was referred by Dr. Balan for anemia.  Review of chart shows patient had labs done April 11, 2017 that showed white count 7.1 hemoglobin 14.9 platelets 149,000.  His MCV was 105.  Chemistries were within normal limits other than a potassium of 6 his creatinine was 1.60, liver function tests were normal calcium was normal at 9.7.  There was reported history of monoclonal protein.  Patient denies any blood in his stool or his urine. He reportedly had a colonoscopy done in 2017 that showed no polyps.  He denies any history of blood transfusion.  Wife reported she has concerns about him being on metformin due to potential kidney effects with that medication.    Current Status:  Pt is seen today for follow-up to go over labs.  He is accompanied by family.    Problem List Patient Active Problem List   Diagnosis Date Noted  . Precordial pain [R07.2] 03/20/2014  . Complete heart block (HCC) [I44.2] 02/01/2013  . S/P AV nodal ablation [Z98.890]   . Warfarin anticoagulation [Z79.01]   . CORONARY ATHEROSCLEROSIS NATIVE CORONARY ARTERY [I25.10] 02/01/2008  . Permanent atrial   fibrillation (HCC) [I48.2] 02/01/2008  . Cardiac pacemaker in situ [Z95.0] 02/01/2008    Past Medical History Past Medical History:  Diagnosis Date  . Anxiety   . Atrial fibrillation (HCC)    Permanent  . Coronary artery disease    Mild nonobstructive 1/12  . DM (diabetes mellitus) (HCC)   . GERD (gastroesophageal reflux disease)   . Gout   . Hearing disorder, cochlear   . Hiatal hernia   . HLD (hyperlipidemia)   . HTN (hypertension)   . S/P AV nodal ablation    s/p SJM  PPM; gen change 02-01-13 by Dr Allred  . Warfarin anticoagulation     Past Surgical History Past Surgical History:  Procedure Laterality Date  . CATARACT EXTRACTION Right   . COCHLEAR IMPLANT Right   . HEMORRHOID BANDING    . PACEMAKER GENERATOR CHANGE Bilateral 02/01/2013   Procedure: PACEMAKER GENERATOR CHANGE;  Surgeon: James D Allred, MD;  Location: [facility];  Service: Cardiovascular;  Laterality: Bilateral;  . PACEMAKER PLACEMENT  2002; 2014   SJM implanted by Dr Taylor with AV nodal ablation performed; gen change to [employer] SR RF by Dr Allred 02-01-13  . RETINAL LASER PROCEDURE Left   . SLT LASER APPLICATION Left 12/14/2015   Procedure: SLT LASER APPLICATION;  Surgeon: Carroll F Haines, MD;  Location: [facility];  Service: Ophthalmology;  Laterality: Left;    Family History Family History  Problem Relation Age of Onset  . COPD Mother   . Emphysema Mother   . Cancer - Lung Father   . Heart disease Paternal Uncle   . Stomach cancer Neg Hx   . Colon cancer Neg Hx      Social History  reports that he has never smoked. He has never used smokeless tobacco. He reports that he does not drink alcohol or use drugs.  Medications  Current Outpatient Medications:  .  allopurinol (ZYLOPRIM) 100 MG tablet, Take 100 mg by mouth Daily. , Disp: , Rfl:  .  clonazePAM (KLONOPIN) 0.5 MG tablet, Take 0.5 mg by mouth daily. , Disp: , Rfl:  .  fish oil-omega-3 fatty acids 1000 MG capsule, Take 1 g by mouth 2 (two) times daily.  , Disp: , Rfl:  .  HUMULIN 70/30 (70-30) 100 UNIT/ML injection, Inject 30 Units into the skin 2 (two) times daily with a meal. , Disp: , Rfl:  .  isosorbide mononitrate (IMDUR) 60 MG 24 hr tablet, Take 120 mg by mouth daily., Disp: , Rfl:  .  loperamide (IMODIUM) 2 MG capsule, Take 1 capsule (2 mg total) by mouth 4 (four) times daily as needed for diarrhea or loose stools., Disp: 12 capsule, Rfl: 0 .  metFORMIN (GLUCOPHAGE) 1000 MG tablet, Take 1,000 mg by mouth 2 (two)  times daily with a meal.  , Disp: , Rfl:  .  metoprolol succinate (TOPROL-XL) 25 MG 24 hr tablet, Take 0.5 tablets by mouth Daily., Disp: , Rfl:  .  nitroGLYCERIN (NITROSTAT) 0.4 MG SL tablet, Place 1 tablet (0.4 mg total) under the tongue every 5 (five) minutes x 3 doses as needed., Disp: 25 tablet, Rfl: 3 .  ondansetron (ZOFRAN ODT) 4 MG disintegrating tablet, Take 1 tablet (4 mg total) by mouth every 8 (eight) hours as needed for nausea., Disp: 10 tablet, Rfl: 0 .  ramipril (ALTACE) 5 MG capsule, Take 5 mg by mouth daily.  , Disp: , Rfl:  .  ranitidine (ZANTAC) 150 MG tablet, Take 1 tablet by mouth   2 (two) times daily., Disp: , Rfl:  .  sertraline (ZOLOFT) 50 MG tablet, Take 50 mg by mouth daily. Take 1  tab daily, Disp: , Rfl:  .  simvastatin (ZOCOR) 40 MG tablet, Take 40 mg by mouth at bedtime.  , Disp: , Rfl:  .  warfarin (COUMADIN) 5 MG tablet, Take 5-7.5 mg by mouth daily. 7.5 mg on Monday and Friday and 5 mg all other days (MANAGED BY PMD), Disp: , Rfl:   Allergies Contrast media [iodinated diagnostic agents]; Shellfish allergy; and Sulfonamide derivatives  Review of Systems Review of Systems - Oncology ROS as per HPI otherwise 12 point ROS is negative.   Physical Exam  Vitals Wt Readings from Last 3 Encounters:  08/17/17 199 lb 8 oz (90.5 kg)  08/03/17 199 lb 6.4 oz (90.4 kg)  04/27/17 196 lb (88.9 kg)   Temp Readings from Last 3 Encounters:  08/03/17 98.1 F (36.7 C) (Oral)  04/11/17 98.3 F (36.8 C)  12/05/16 (!) 97.1 F (36.2 C)   BP Readings from Last 3 Encounters:  08/17/17 (!) 144/66  08/03/17 (!) 144/64  04/27/17 130/78   Pulse Readings from Last 3 Encounters:  08/17/17 70  08/03/17 60  04/27/17 86   Constitutional: Well-developed, well-nourished, and in no distress.   HENT:Head: Normocephalic and atraumatic.  Mouth/Throat: No oropharyngeal exudate. Mucosa moist. Eyes: Pupils are equal, round, and reactive to light. Conjunctivae are normal. No scleral  icterus.  Neck: Normal range of motion. Neck supple. No JVD present.  Cardiovascular: Normal rate, regular rhythm and normal heart sounds.  Exam reveals no gallop and no friction rub.  Pt has pacemaker.   Pulmonary/Chest: Effort normal and breath sounds normal. No respiratory distress. No wheezes.No rales.  Abdominal: Soft. Bowel sounds are normal. No distension. There is no tenderness. There is no guarding.  Musculoskeletal: No edema or tenderness.  Lymphadenopathy: No cervical, axillary or supraclavicular adenopathy.  Neurological: Alert and oriented to person, place, and time. No cranial nerve deficit.  Skin: Skin is warm and dry. No rash noted. No erythema. No pallor.  Psychiatric: Affect and judgment normal.   Labs No visits with results within 3 Day(s) from this visit.  Latest known visit with results is:  Appointment on 08/03/2017  Component Date Value Ref Range Status  . WBC 08/03/2017 4.4  4.0 - 10.5 K/uL Final  . RBC 08/03/2017 3.48* 4.22 - 5.81 MIL/uL Final  . Hemoglobin 08/03/2017 12.2* 13.0 - 17.0 g/dL Final  . HCT 08/03/2017 35.9* 39.0 - 52.0 % Final  . MCV 08/03/2017 103.2* 78.0 - 100.0 fL Final  . MCH 08/03/2017 35.1* 26.0 - 34.0 pg Final  . MCHC 08/03/2017 34.0  30.0 - 36.0 g/dL Final  . RDW 08/03/2017 12.9  11.5 - 15.5 % Final  . Platelets 08/03/2017 109* 150 - 400 K/uL Final   Comment: SPECIMEN CHECKED FOR CLOTS PLATELET COUNT CONFIRMED BY SMEAR   . Neutrophils Relative % 08/03/2017 58  % Final  . Neutro Abs 08/03/2017 2.6  1.7 - 7.7 K/uL Final  . Lymphocytes Relative 08/03/2017 30  % Final  . Lymphs Abs 08/03/2017 1.3  0.7 - 4.0 K/uL Final  . Monocytes Relative 08/03/2017 8  % Final  . Monocytes Absolute 08/03/2017 0.3  0.1 - 1.0 K/uL Final  . Eosinophils Relative 08/03/2017 3  % Final  . Eosinophils Absolute 08/03/2017 0.1  0.0 - 0.7 K/uL Final  . Basophils Relative 08/03/2017 1  % Final  . Basophils Absolute 08/03/2017 0.0    0.0 - 0.1 K/uL Final   Performed  at [facility], [address], [place], [place] [address]  . Sodium 08/03/2017 137  135 - 145 mmol/L Final  . Potassium 08/03/2017 4.9  3.5 - 5.1 mmol/L Final  . Chloride 08/03/2017 106  101 - 111 mmol/L Final  . CO2 08/03/2017 24  22 - 32 mmol/L Final  . Glucose, Bld 08/03/2017 130* 65 - 99 mg/dL Final  . BUN 08/03/2017 22* 6 - 20 mg/dL Final  . Creatinine, Ser 08/03/2017 1.06  0.61 - 1.24 mg/dL Final  . Calcium 08/03/2017 9.2  8.9 - 10.3 mg/dL Final  . Total Protein 08/03/2017 7.9  6.5 - 8.1 g/dL Final  . Albumin 08/03/2017 3.8  3.5 - 5.0 g/dL Final  . AST 08/03/2017 33  15 - 41 U/L Final  . ALT 08/03/2017 30  17 - 63 U/L Final  . Alkaline Phosphatase 08/03/2017 99  38 - 126 U/L Final  . Total Bilirubin 08/03/2017 1.0  0.3 - 1.2 mg/dL Final  . GFR calc non Af Amer 08/03/2017 >60  >60 mL/min Final  . GFR calc Af Amer 08/03/2017 >60  >60 mL/min Final   Comment: (NOTE) The eGFR has been calculated using the CKD EPI equation. This calculation has not been validated in all clinical situations. eGFR's persistently <60 mL/min signify possible Chronic Kidney Disease.   Georgiann Hahn gap 08/03/2017 7  5 - 15 Final   Performed at [facility], [address], [place], [place] [address]  . LDH 08/03/2017 126  98 - 192 U/L Final   Performed at [facility], [address], [place], [place] [address]  . Total Protein ELP 08/03/2017 7.6  6.0 - 8.5 g/dL Final  . Albumin ELP 08/03/2017 3.8  2.9 - 4.4 g/dL Final  . Alpha-1-Globulin 08/03/2017 0.2  0.0 - 0.4 g/dL Final  . Alpha-2-Globulin 08/03/2017 0.7  0.4 - 1.0 g/dL Final  . Beta Globulin 08/03/2017 0.9  0.7 - 1.3 g/dL Final  . Gamma Globulin 08/03/2017 2.0* 0.4 - 1.8 g/dL Final  . M-Spike, % 08/03/2017 1.6* Not Observed g/dL Final  . SPE Interp. 08/03/2017 Comment   Final   Comment: (NOTE) The SPE pattern demonstrates a single peak (M-spike) in the gamma region which may represent monoclonal protein. This peak may also be caused by circulating  immune complexes, cryoglobulins, C-reactive protein, fibrinogen or hemolysis.  If clinically indicated, the presence of a monoclonal gammopathy may be confirmed by immuno- fixation, as well as an evaluation of the urine for the presence of Bence-Jones protein. Performed At: [facility] [place], [place] [id number] Rush Farmer MD [id number]   . Comment 08/03/2017 Comment   Final   Comment: (NOTE) Protein electrophoresis scan will follow via computer, mail, or courier delivery.   Marland Kitchen GLOBULIN, TOTAL 08/03/2017 3.8  2.2 - 3.9 g/dL Corrected  . A/G Ratio 08/03/2017 1.0  0.7 - 1.7 Corrected   Performed at [facility], [address] [place], [place] [address]  . Ferritin 08/03/2017 78  24 - 336 ng/mL Final   Performed at [facility] [address], [place], [place] [address]  . Vitamin B-12 08/03/2017 235  180 - 914 pg/mL Final   Comment: (NOTE) This assay is not validated for testing neonatal or myeloproliferative syndrome specimens for Vitamin B12 levels. Performed at [facility], [place] [address] [place], [place] [address]   . Folate 08/03/2017 33.0  >5.9 ng/mL Final   Performed  at [facility], [place] [address] [place], [place] [address]  . Methylmalonic Acid, Ur 08/03/2017 56.9* 1.6 - 29.7 umol/L Final   Comment: (NOTE) This test was developed and its performance characteristics determined by [facility]. It has not been cleared or approved by the Food and Drug Administration.   Marland Kitchen MMA - Normalized 08/03/2017 3.1* 0.3 - 2.8 umol/mmol cr Final  . Creatinine(Crt), U 08/03/2017 2.10  0.30 - 3.00 g/L Final   Comment: (NOTE) Performed At: [facility] [place], [place] [id number] Rush Farmer MD [id number] Performed at [facility], [address], [place], [place] [address]   . Haptoglobin 08/03/2017 82  34 - 200 mg/dL Final   Comment: (NOTE) Performed At: [facility] [place], [place]  [id number] Rush Farmer MD [id number] Performed at [facility], [address], [place], [place] [address]      Pathology Orders Placed This Encounter  Procedures  . DG Bone Survey Met    Standing Status:   Future    Number of Occurrences:   1    Standing Expiration Date:   10/18/2018    Order Specific Question:   Reason for Exam (SYMPTOM  OR DIAGNOSIS REQUIRED)    Answer:   monoclonal gammopathy    Order Specific Question:   Preferred imaging location?    Answer:   [facility]    Order Specific Question:   Radiology Contrast Protocol - do NOT remove file path    Answer:   _0 [email or web address]  . CT BONE MARROW BIOPSY & ASPIRATION    Standing Status:   Future    Standing Expiration Date:   11/18/2018    Order Specific Question:   Reason for Exam (SYMPTOM  OR DIAGNOSIS REQUIRED)    Answer:   monoclonal gammopathy    Order Specific Question:   Preferred imaging location?    Answer:   [facility]    Order Specific Question:   Radiology Contrast Protocol - do NOT remove file path    Answer:   _1 [email or web address]  . CT Biopsy    Standing Status:   Future    Standing Expiration Date:   08/17/2018    Order Specific Question:   Lab orders requested (DO NOT place separate lab orders, these will be automatically ordered during procedure specimen collection):    Answer:   Surgical Pathology    Comments:   flow cytometry and cytogentics Myeloma fish panel    Order Specific Question:   Lab orders requested (DO NOT place separate lab orders, these will be automatically ordered during procedure specimen collection):    Answer:   Other    Order Specific Question:   Reason for Exam (SYMPTOM  OR DIAGNOSIS REQUIRED)    Answer:   monoclonal gammopathy and thrombocytopenia    Order Specific Question:   Preferred imaging location?    Answer:   [facility]    Order Specific Question:   Radiology Contrast Protocol -  do NOT remove file path    Answer:   _2 [email or web address]  . CBC with Differential/Platelet    Standing Status:   Future    Standing Expiration Date:   08/18/2018  . Comprehensive metabolic panel    Standing Status:   Future    Standing Expiration Date:   08/18/2018  . Lactate dehydrogenase    Standing Status:   Future    Standing Expiration Date:   08/18/2018  . Kappa/lambda light chains  Standing Status:   Future    Standing Expiration Date:   08/18/2018  . IgG, IgA, IgM    Standing Status:   Future    Standing Expiration Date:   08/18/2018  . Beta 2 microglobulin, serum    Standing Status:   Future    Standing Expiration Date:   08/18/2018         MD 

## 2017-08-25 ENCOUNTER — Other Ambulatory Visit (HOSPITAL_COMMUNITY): Payer: Self-pay | Admitting: Internal Medicine

## 2017-08-25 DIAGNOSIS — D539 Nutritional anemia, unspecified: Secondary | ICD-10-CM

## 2017-08-25 DIAGNOSIS — D696 Thrombocytopenia, unspecified: Secondary | ICD-10-CM

## 2017-08-25 DIAGNOSIS — D472 Monoclonal gammopathy: Secondary | ICD-10-CM

## 2017-08-30 ENCOUNTER — Telehealth (HOSPITAL_COMMUNITY): Payer: Self-pay | Admitting: *Deleted

## 2017-08-31 ENCOUNTER — Ambulatory Visit: Payer: Medicare Other | Admitting: Physician Assistant

## 2017-08-31 ENCOUNTER — Encounter: Payer: Self-pay | Admitting: Physician Assistant

## 2017-08-31 VITALS — BP 142/68 | HR 72 | Ht 70.0 in | Wt 200.0 lb

## 2017-08-31 DIAGNOSIS — R1314 Dysphagia, pharyngoesophageal phase: Secondary | ICD-10-CM

## 2017-08-31 NOTE — Patient Instructions (Signed)
If you are age 74 or older, your body mass index should be between 23-30. Your Body mass index is 28.7 kg/m. If this is out of the aforementioned range listed, please consider follow up with your Primary Care Provider.  Follow the speech pathology recommendations for safe swallowing.   No straws. Small bites.  Take 2 swallows with each bite or sip.   Follow up with our office as needed.  Our number is [phone]- choose option 2 .

## 2017-08-31 NOTE — Progress Notes (Signed)
Subjective:    Patient ID: Jay Campbell, male    DOB: 1945/11/25, 72 y.o.   MRN: [id number]  HPI Jay Campbell is a pleasant 72 year old white male, known to Dr. Silverio Decamp  who is referred back today by Dr. Mathis Dad Higgs/hematology for complaint of dysphasia to liquids. Patient is felt to have a monoclonal gammopathy and is undergoing work-up to include bone marrow biopsy to be done next week for definite diagnosis.  He has had a macrocytic anemia with most recent labs showing WBC of 4000, hemoglobin 12.2 hematocrit of 35.9, MCV of 103 and platelets 109.  Recent ferritin was normal at 78. Patient mentioned during that visit that he had had some choking with liquids which had been persistent. On further questioning today patient has had chronic problems with dysphasia to liquids over the past 3 to 4 years.  This has not worsened and occurs almost every meal and is worse if he uses a straw or gulps his drinks.  He gets coughing and choking frequently.  He has no difficulty with solid foods and no complaints of heartburn or indigestion.  No abdominal discomfort.  He uses Zantac twice a day for mild GERD. He did undergo speech path swallowing eval in 2016 for the same complaints.  He was found to have a delayed swallow initiation premature spillage to the vallecula, premature spillage to the piriform sinuses and reduced tongue base retraction there was some penetration and moderate aspiration with pharyngeal residue, and reduced airway/laryngeal closure. Recommendations were to use a cup and no straw use, to sip liquids slowly, remain upright for 30 to 60 minutes after meals.  He was to chew his food carefully and slowly and swallow twice for each bite or each few sips of liquids. He and his wife recall being given those recommendations.  His wife says that he was good about following the steroid instructions initially and then slacked off.    He did have a EGD in March 2016 per Dr. Deatra Ina with diet relation of  a GE junction stricture to 18 mm otherwise normal exam. He had colonoscopy with Dr. Silverio Decamp n September 2018 small sigmoid diverticuli were noted medium sized internal hemorrhoids, no polyps   Review of Systems;Pertinent positive and negative review of systems were noted in the above HPI section.  All other review of systems was otherwise negative.  Outpatient Encounter Medications as of 08/31/2017  Medication Sig  . allopurinol (ZYLOPRIM) 100 MG tablet Take 100 mg by mouth Daily.   . clonazePAM (KLONOPIN) 0.5 MG tablet Take 0.5 mg by mouth daily.   . fish oil-omega-3 fatty acids 1000 MG capsule Take 1 g by mouth 2 (two) times daily.    Marland Kitchen HUMULIN 70/30 (70-30) 100 UNIT/ML injection Inject 30 Units into the skin 2 (two) times daily with a meal.   . isosorbide mononitrate (IMDUR) 60 MG 24 hr tablet Take 120 mg by mouth daily.  Marland Kitchen loperamide (IMODIUM) 2 MG capsule Take 1 capsule (2 mg total) by mouth 4 (four) times daily as needed for diarrhea or loose stools.  . metFORMIN (GLUCOPHAGE) 1000 MG tablet Take 1,000 mg by mouth 2 (two) times daily with a meal.    . metoprolol succinate (TOPROL-XL) 25 MG 24 hr tablet Take 0.5 tablets by mouth Daily.  . nitroGLYCERIN (NITROSTAT) 0.4 MG SL tablet Place 1 tablet (0.4 mg total) under the tongue every 5 (five) minutes x 3 doses as needed.  . ramipril (ALTACE) 5 MG capsule Take 5 mg  by mouth daily.    . ranitidine (ZANTAC) 150 MG tablet Take 1 tablet by mouth 2 (two) times daily.  . sertraline (ZOLOFT) 50 MG tablet Take 50 mg by mouth daily. Take 1  tab daily  . simvastatin (ZOCOR) 40 MG tablet Take 40 mg by mouth at bedtime.    Marland Kitchen warfarin (COUMADIN) 5 MG tablet Take 5-7.5 mg by mouth daily. 7.5 mg on Monday and Friday and 5 mg all other days (MANAGED BY PMD)  . [DISCONTINUED] ondansetron (ZOFRAN ODT) 4 MG disintegrating tablet Take 1 tablet (4 mg total) by mouth every 8 (eight) hours as needed for nausea.   No facility-administered encounter medications on  file as of 08/31/2017.    Allergies  Allergen Reactions  . Contrast Media [Iodinated Diagnostic Agents]   . Shellfish Allergy Itching and Rash  . Sulfonamide Derivatives Itching and Rash   Patient Active Problem List   Diagnosis Date Noted  . Precordial pain 03/20/2014  . Complete heart block ([place]) 02/01/2013  . S/P AV nodal ablation   . Warfarin anticoagulation   . CORONARY ATHEROSCLEROSIS NATIVE CORONARY ARTERY 02/01/2008  . Permanent atrial fibrillation ([place]) 02/01/2008  . Cardiac pacemaker in situ 02/01/2008   Social History   Socioeconomic History  . Marital status: Married    Spouse name: Not on file  . Number of children: Not on file  . Years of education: Not on file  . Highest education level: Not on file  Occupational History  . Not on file  Social Needs  . Financial resource strain: Not on file  . Food insecurity:    Worry: Not on file    Inability: Not on file  . Transportation needs:    Medical: Not on file    Non-medical: Not on file  Tobacco Use  . Smoking status: Never Smoker  . Smokeless tobacco: Never Used  Substance and Sexual Activity  . Alcohol use: No    Alcohol/week: 0.0 oz  . Drug use: No  . Sexual activity: Not on file  Lifestyle  . Physical activity:    Days per week: Not on file    Minutes per session: Not on file  . Stress: Not on file  Relationships  . Social connections:    Talks on phone: Not on file    Gets together: Not on file    Attends religious service: Not on file    Active member of club or organization: Not on file    Attends meetings of clubs or organizations: Not on file    Relationship status: Not on file  . Intimate partner violence:    Fear of current or ex partner: Not on file    Emotionally abused: Not on file    Physically abused: Not on file    Forced sexual activity: Not on file  Other Topics Concern  . Not on file  Social History Narrative  . Not on file    [name] family history includes COPD  in his mother; Cancer - Lung in his father; Emphysema in his mother; Heart disease in his paternal uncle.      Objective:    Vitals:   08/31/17 1119  BP: (!) 142/68  Pulse: 72    Physical Exam; well-developed elderly white male in no acute distress, accompanied by his wife both pleasant blood pressure 142/68 pulse 72, height 5 foot 10, weight 200, BMI 28.7.  HEENT; nontraumatic normocephalic EOMI PERRLA sclera anicteric oropharynx clear, Cardiovascular ;regular rate and  rhythm with S1-S2 no murmur rub or gallop, pacemaker in the chest wall, Pulmonary; clear bilaterally, Abdomen; soft, nontender nondistended bowel sounds are active there is no palpable mass or hepatosplenomegaly Rectal ;exam not done, Extremities; no clubbing cyanosis or edema, Neuro alert and oriented, grossly nonfocal affect appropriate       Assessment & Plan:   #67 72 year old white male with chronic long-standing dysphagia to liquids with previous speech path evaluation showing pharyngeal stage dysphagia with delayed initiation and premature spillage of liquids into the valleculae and piriform sinuses with penetration and intermittent aspiration.  The symptoms are managed with compensatory strategies. He has not had any change in his symptoms over the past few years. #2.  History of esophageal stricture status post dilation 2016, no current symptoms to suggest recurrent stricture and no solid food dysphasia. #3 diverticulosis #4.  Atrial fibrillation-on chronic Coumadin #5.  History of complete heart block status post pacemaker   #6 insulin-dependent diabetes mellitus #7.  Probable monoclonal gammopathy, work-up in progress with bone marrow biopsy pending.  Plan; I carefully reviewed the findings from the previous speech path evaluation with the patient and his wife.  We reviewed all of the compensatory mechanisms to prevent difficulty with aspiration, including eating and drinking variably slowly, sipping liquids no  gulping and no straws.  Patient to remain upright for 30 to 60 minutes after meals.  Vision is advised to swallow a couple of times after every few bites of food or liquid.  Further GI work-up indicated at present.  He will follow-up with Dr. Silverio Decamp or myself on an as-needed basis.  Jay Campbell S Frenchie Pribyl PA-C 08/31/2017   Cc: Zoila Shutter, MD

## 2017-09-04 ENCOUNTER — Other Ambulatory Visit: Payer: Self-pay | Admitting: Radiology

## 2017-09-04 NOTE — Progress Notes (Signed)
Reviewed and agree with documentation and assessment and plan. K. Veena Nandigam , MD   

## 2017-09-05 ENCOUNTER — Ambulatory Visit (HOSPITAL_COMMUNITY)
Admission: RE | Admit: 2017-09-05 | Discharge: 2017-09-05 | Disposition: A | Discharge status: Home / Self Care | Payer: Medicare Other | Source: Ambulatory Visit | Attending: Internal Medicine | Admitting: Internal Medicine

## 2017-09-05 ENCOUNTER — Encounter (HOSPITAL_COMMUNITY): Payer: Self-pay

## 2017-09-05 DIAGNOSIS — Z7901 Long term (current) use of anticoagulants: Secondary | ICD-10-CM | POA: Diagnosis not present

## 2017-09-05 DIAGNOSIS — D696 Thrombocytopenia, unspecified: Secondary | ICD-10-CM | POA: Diagnosis present

## 2017-09-05 DIAGNOSIS — F329 Major depressive disorder, single episode, unspecified: Secondary | ICD-10-CM | POA: Insufficient documentation

## 2017-09-05 DIAGNOSIS — Z882 Allergy status to sulfonamides status: Secondary | ICD-10-CM | POA: Insufficient documentation

## 2017-09-05 DIAGNOSIS — I482 Chronic atrial fibrillation: Secondary | ICD-10-CM | POA: Insufficient documentation

## 2017-09-05 DIAGNOSIS — M109 Gout, unspecified: Secondary | ICD-10-CM | POA: Diagnosis not present

## 2017-09-05 DIAGNOSIS — D472 Monoclonal gammopathy: Secondary | ICD-10-CM | POA: Diagnosis present

## 2017-09-05 DIAGNOSIS — E785 Hyperlipidemia, unspecified: Secondary | ICD-10-CM | POA: Insufficient documentation

## 2017-09-05 DIAGNOSIS — I10 Essential (primary) hypertension: Secondary | ICD-10-CM | POA: Insufficient documentation

## 2017-09-05 DIAGNOSIS — D539 Nutritional anemia, unspecified: Secondary | ICD-10-CM | POA: Diagnosis present

## 2017-09-05 DIAGNOSIS — E119 Type 2 diabetes mellitus without complications: Secondary | ICD-10-CM | POA: Insufficient documentation

## 2017-09-05 DIAGNOSIS — Z79899 Other long term (current) drug therapy: Secondary | ICD-10-CM | POA: Insufficient documentation

## 2017-09-05 DIAGNOSIS — K219 Gastro-esophageal reflux disease without esophagitis: Secondary | ICD-10-CM | POA: Diagnosis not present

## 2017-09-05 DIAGNOSIS — I2510 Atherosclerotic heart disease of native coronary artery without angina pectoris: Secondary | ICD-10-CM | POA: Diagnosis not present

## 2017-09-05 DIAGNOSIS — Z7984 Long term (current) use of oral hypoglycemic drugs: Secondary | ICD-10-CM | POA: Diagnosis not present

## 2017-09-05 DIAGNOSIS — K449 Diaphragmatic hernia without obstruction or gangrene: Secondary | ICD-10-CM | POA: Insufficient documentation

## 2017-09-05 DIAGNOSIS — D469 Myelodysplastic syndrome, unspecified: Secondary | ICD-10-CM | POA: Insufficient documentation

## 2017-09-05 DIAGNOSIS — F419 Anxiety disorder, unspecified: Secondary | ICD-10-CM | POA: Insufficient documentation

## 2017-09-05 LAB — CBC WITH DIFFERENTIAL/PLATELET
BASOS PCT: 0 %
Basophils Absolute: 0 10*3/uL (ref 0.0–0.1)
EOS ABS: 0.1 10*3/uL (ref 0.0–0.7)
EOS PCT: 2 %
HCT: 40.5 % (ref 39.0–52.0)
Hemoglobin: 13.7 g/dL (ref 13.0–17.0)
Lymphocytes Relative: 29 %
Lymphs Abs: 1.8 10*3/uL (ref 0.7–4.0)
MCH: 35.9 pg — ABNORMAL HIGH (ref 26.0–34.0)
MCHC: 33.8 g/dL (ref 30.0–36.0)
MCV: 106 fL — ABNORMAL HIGH (ref 78.0–100.0)
MONO ABS: 0.5 10*3/uL (ref 0.1–1.0)
Monocytes Relative: 8 %
NEUTROS ABS: 3.7 10*3/uL (ref 1.7–7.7)
Neutrophils Relative %: 61 %
PLATELETS: 149 10*3/uL — AB (ref 150–400)
RBC: 3.82 MIL/uL — ABNORMAL LOW (ref 4.22–5.81)
RDW: 13.4 % (ref 11.5–15.5)
WBC: 6.1 10*3/uL (ref 4.0–10.5)

## 2017-09-05 LAB — GLUCOSE, CAPILLARY
Glucose-Capillary: 69 mg/dL — ABNORMAL LOW (ref 70–99)
Glucose-Capillary: 108 mg/dL — ABNORMAL HIGH (ref 70–99)

## 2017-09-05 LAB — PROTIME-INR
INR: 1.15
PROTHROMBIN TIME: 14.6 s (ref 11.4–15.2)

## 2017-09-05 MED ORDER — SODIUM CHLORIDE 0.9 % IV SOLN
INTRAVENOUS | Status: DC
  Administered 2017-09-05: 09:00:00 via INTRAVENOUS

## 2017-09-05 MED ORDER — FENTANYL CITRATE (PF) 100 MCG/2ML IJ SOLN
INTRAMUSCULAR | Status: AC
  Filled 2017-09-05: qty 4

## 2017-09-05 MED ORDER — DEXTROSE 50 % IV SOLN
25.0000 mL | Freq: Once | INTRAVENOUS | Status: AC
  Administered 2017-09-05: 25 mL via INTRAVENOUS
  Filled 2017-09-05: qty 50

## 2017-09-05 MED ORDER — FENTANYL CITRATE (PF) 100 MCG/2ML IJ SOLN
INTRAMUSCULAR | Status: AC | PRN
  Administered 2017-09-05 (×2): 50 ug via INTRAVENOUS

## 2017-09-05 MED ORDER — FLUMAZENIL 0.5 MG/5ML IV SOLN
INTRAVENOUS | Status: AC
  Filled 2017-09-05: qty 5

## 2017-09-05 MED ORDER — HYDROCODONE-ACETAMINOPHEN 5-325 MG PO TABS: 1.0000 | ORAL_TABLET | ORAL | Status: DC | PRN

## 2017-09-05 MED ORDER — MIDAZOLAM HCL 2 MG/2ML IJ SOLN
INTRAMUSCULAR | Status: AC | PRN
  Administered 2017-09-05 (×2): 1 mg via INTRAVENOUS

## 2017-09-05 MED ORDER — MIDAZOLAM HCL 2 MG/2ML IJ SOLN
INTRAMUSCULAR | Status: AC
  Filled 2017-09-05: qty 4

## 2017-09-05 MED ORDER — NALOXONE HCL 0.4 MG/ML IJ SOLN
INTRAMUSCULAR | Status: AC
  Filled 2017-09-05: qty 1

## 2017-09-05 NOTE — Discharge Instructions (Signed)

## 2017-09-05 NOTE — Progress Notes (Signed)
CBG 69 upon arrival to Short Stay pre-procedure Rowe Robert, PA notified.

## 2017-09-05 NOTE — Procedures (Signed)
  Procedure: CT bone marrow biopsy R iliac EBL:   minimal Complications:  none immediate  See full dictation in [facility].  Dillard Cannon MD Main # [phone] Pager  [phone]

## 2017-09-05 NOTE — Consult Note (Signed)
Chief Complaint: Patient was seen in consultation today for CT-guided bone marrow biopsy  Referring Physician(s): Higgs,Vetta  Supervising Physician: Arne Cleveland  Patient Status: [facility] - Out-pt  History of Present Illness: Jay Campbell is a 72 y.o. male with history of thrombocytopenia, macrocytic anemia and MGUS.  He presents today for CT-guided bone marrow biopsy for further evaluation.  Additional medical history as below.   Past Medical History:  Diagnosis Date  . Anxiety   . Atrial fibrillation ([place])    Permanent  . Coronary artery disease    Mild nonobstructive 1/12  . DM (diabetes mellitus) ([place])   . GERD (gastroesophageal reflux disease)   . Gout   . Hearing disorder, cochlear   . Hiatal hernia   . HLD (hyperlipidemia)   . HTN (hypertension)   . S/P AV nodal ablation    s/p SJM PPM; gen change 02-01-13 by Dr Rayann Heman  . Warfarin anticoagulation     Past Surgical History:  Procedure Laterality Date  . CATARACT EXTRACTION Right   . COCHLEAR IMPLANT Right   . HEMORRHOID BANDING    . PACEMAKER GENERATOR CHANGE Bilateral 02/01/2013   Procedure: PACEMAKER GENERATOR CHANGE;  Surgeon: Coralyn Mark, MD;  Location: [place] CATH LAB;  Service: Cardiovascular;  Laterality: Bilateral;  . PACEMAKER PLACEMENT  2002; 2014   SJM implanted by Dr Lovena Le with AV nodal ablation performed; gen change to [employer] SR RF by Dr Rayann Heman 02-01-13  . RETINAL LASER PROCEDURE Left   . SLT LASER APPLICATION Left 06/0/0459   Procedure: SLT LASER APPLICATION;  Surgeon: Williams Che, MD;  Location: [facility];  Service: Ophthalmology;  Laterality: Left;    Allergies: Contrast media [iodinated diagnostic agents]; Shellfish allergy; and Sulfonamide derivatives  Medications: Prior to Admission medications   Medication Sig Start Date End Date Taking? Authorizing Provider  allopurinol (ZYLOPRIM) 100 MG tablet Take 100 mg by mouth Daily.  12/15/11   [provider]  clonazePAM  (KLONOPIN) 0.5 MG tablet Take 0.5 mg by mouth daily.  03/11/14   [provider]  fish oil-omega-3 fatty acids 1000 MG capsule Take 1 g by mouth 2 (two) times daily.      [provider]  HUMULIN 70/30 (70-30) 100 UNIT/ML injection Inject 30 Units into the skin 2 (two) times daily with a meal.  05/19/14   [provider]  isosorbide mononitrate (IMDUR) 60 MG 24 hr tablet Take 120 mg by mouth daily. 09/26/11   de Stanford Scotland, MD  loperamide (IMODIUM) 2 MG capsule Take 1 capsule (2 mg total) by mouth 4 (four) times daily as needed for diarrhea or loose stools. 04/11/17   Noemi Chapel, MD  metFORMIN (GLUCOPHAGE) 1000 MG tablet Take 1,000 mg by mouth 2 (two) times daily with a meal.      [provider]  metoprolol succinate (TOPROL-XL) 25 MG 24 hr tablet Take 0.5 tablets by mouth Daily. 12/30/11   [provider]  nitroGLYCERIN (NITROSTAT) 0.4 MG SL tablet Place 1 tablet (0.4 mg total) under the tongue every 5 (five) minutes x 3 doses as needed. 04/15/16   Allred, Jeneen Rinks, MD  ramipril (ALTACE) 5 MG capsule Take 5 mg by mouth daily.      [provider]  ranitidine (ZANTAC) 150 MG tablet Take 1 tablet by mouth 2 (two) times daily. 11/14/16   [provider]  sertraline (ZOLOFT) 50 MG tablet Take 50 mg by mouth daily. Take 1  tab daily  [provider]  simvastatin (ZOCOR) 40 MG tablet Take 40 mg by mouth at bedtime.      [provider]  warfarin (COUMADIN) 5 MG tablet Take 5-7.5 mg by mouth daily. 7.5 mg on Monday and Friday and 5 mg all other days (MANAGED BY PMD)    [provider]     Family History  Problem Relation Age of Onset  . COPD Mother   . Emphysema Mother   . Cancer - Lung Father   . Heart disease Paternal Uncle   . Stomach cancer Neg Hx   . Colon cancer Neg Hx     Social History   Socioeconomic History  . Marital status: Married    Spouse name: Not on file  . Number of children: Not on file    . Years of education: Not on file  . Highest education level: Not on file  Occupational History  . Not on file  Social Needs  . Financial resource strain: Not on file  . Food insecurity:    Worry: Not on file    Inability: Not on file  . Transportation needs:    Medical: Not on file    Non-medical: Not on file  Tobacco Use  . Smoking status: Never Smoker  . Smokeless tobacco: Never Used  Substance and Sexual Activity  . Alcohol use: No    Alcohol/week: 0.0 oz  . Drug use: No  . Sexual activity: Not on file  Lifestyle  . Physical activity:    Days per week: Not on file    Minutes per session: Not on file  . Stress: Not on file  Relationships  . Social connections:    Talks on phone: Not on file    Gets together: Not on file    Attends religious service: Not on file    Active member of club or organization: Not on file    Attends meetings of clubs or organizations: Not on file    Relationship status: Not on file  Other Topics Concern  . Not on file  Social History Narrative  . Not on file      Review of Systems currently denies fever, headache, chest pain, dyspnea, cough, back pain, nausea, vomiting or bleeding.  He does have some intermittent abdominal discomfort, dysphagia and occasional dizziness.  Vital Signs: Blood pressure 140/76, temp 97.8, heart rate 60, respirations 16, O2 sat 100% room air   Physical Exam awake, alert.  Chest clear to auscultation bilaterally.  Heart with regular rate and rhythm.  Left chest wall pacer in place.  Abdomen soft, positive bowel sounds, mild periumbilical tenderness to palpation.  No significant lower extremity edema.  Imaging: Dg Bone Survey Met  Result Date: 08/18/2017 CLINICAL DATA:  Monoclonal gammopathy EXAM: METASTATIC BONE SURVEY COMPARISON:  None. FINDINGS: Lateral view of the skull demonstrates no lytic or sclerotic lesion. A metallic foreign body is noted over the posterior skull which may be related to the patient's  given clinical history of cochlear implant. The shoulders are well visualized bilaterally with bilateral acromioclavicular degenerative change. No acute fracture is seen. No lytic or sclerotic lesions are noted. The upper extremities are well visualized bilaterally. Some mild degenerative changes are noted. No definitive lytic or sclerotic lesions are noted. Cervical spine demonstrates multilevel facet hypertrophic changes and osteophytic changes. Carotid calcifications are seen. No lytic or sclerotic lesions are noted. Thoracic spine also demonstrates multifocal osteophytic changes. No compression deformities are seen. No lytic or sclerotic lesions  are noted. No paraspinal mass is seen. Lumbar spine demonstrates degenerative change without compression deformity. No definitive lytic or sclerotic lesions are seen. Considerable amount of retained fecal material is noted. Correlation for possible constipation is recommended. Cardiac shadow is mildly enlarged. No definitive rib abnormality is noted within the chest. Pacing device is seen. The pelvis demonstrates a E normal pelvic ring. Degenerative changes of the hip joints are seen. No definitive lytic or sclerotic lesions are seen. The lower extremities are well visualized bilaterally without definitive lytic or sclerotic lesions. IMPRESSION: No definitive lytic or sclerotic lesions to correspond with the patient's given clinical history at this time. Chronic findings as described above are noted. Electronically Signed   By: [name] Catalina M.D.   On: 08/18/2017 08:10    Labs:  CBC: Recent Labs    04/11/17 1142 08/03/17 1411  WBC 7.1 4.4  HGB 14.9 12.2*  HCT 44.8 35.9*  PLT 149* 109*    COAGS: Recent Labs    04/11/17 1142  INR 2.42    BMP: Recent Labs    04/11/17 1142 04/11/17 1705 08/03/17 1411  NA 135 138 137  K 6.0* 5.6* 4.9  CL 107 113* 106  CO2 18* 17* 24  GLUCOSE 227* 150* 130*  BUN 25* 25* 22*  CALCIUM 9.7 9.0 9.2  CREATININE  1.60* 1.51* 1.06  GFRNONAA 41* 44* >60  GFRAA 48* 51* >60    LIVER FUNCTION TESTS: Recent Labs    04/11/17 1142 08/03/17 1411  BILITOT 1.6* 1.0  AST 33 33  ALT 32 30  ALKPHOS 114 99  PROT 9.1* 7.9  ALBUMIN 4.5 3.8    TUMOR MARKERS: No results for input(s): AFPTM, CEA, CA199, CHROMGRNA in the last 8760 hours.  Assessment and Plan:  72 y.o. male with history of thrombocytopenia, macrocytic anemia and MGUS.  He presents today for CT-guided bone marrow biopsy for further evaluation. Risks and benefits discussed with the patient/family including, but not limited to bleeding, infection, damage to adjacent structures or low yield requiring additional tests.  All of the patient's questions were answered, patient is agreeable to proceed. Consent signed and in chart.     Thank you for this interesting consult.  I greatly enjoyed meeting Jay Campbell and look forward to participating in their care.  A copy of this report was sent to the requesting provider on this date.  Electronically Signed: D. Rowe Robert, PA-C 09/05/2017, 8:58 AM   I spent a total of 20 minutes  in face to face in clinical consultation, greater than 50% of which was counseling/coordinating care for CT-guided bone marrow biopsy

## 2017-09-12 ENCOUNTER — Encounter (HOSPITAL_COMMUNITY): Payer: Self-pay | Admitting: Internal Medicine

## 2017-09-18 ENCOUNTER — Ambulatory Visit (HOSPITAL_COMMUNITY): Payer: Medicare Other | Admitting: Internal Medicine

## 2017-09-21 ENCOUNTER — Other Ambulatory Visit: Payer: Self-pay

## 2017-09-21 ENCOUNTER — Encounter (HOSPITAL_COMMUNITY): Payer: Self-pay | Admitting: Internal Medicine

## 2017-09-21 ENCOUNTER — Inpatient Hospital Stay (HOSPITAL_COMMUNITY): Payer: Medicare Other | Attending: Hematology | Admitting: Internal Medicine

## 2017-09-21 VITALS — BP 131/74 | HR 59 | Temp 98.2°F | Resp 16 | Wt 199.5 lb

## 2017-09-21 DIAGNOSIS — I482 Chronic atrial fibrillation: Secondary | ICD-10-CM | POA: Diagnosis not present

## 2017-09-21 DIAGNOSIS — C9000 Multiple myeloma not having achieved remission: Secondary | ICD-10-CM | POA: Diagnosis present

## 2017-09-21 DIAGNOSIS — Z7901 Long term (current) use of anticoagulants: Secondary | ICD-10-CM | POA: Diagnosis not present

## 2017-09-21 DIAGNOSIS — E119 Type 2 diabetes mellitus without complications: Secondary | ICD-10-CM | POA: Diagnosis not present

## 2017-09-21 DIAGNOSIS — I10 Essential (primary) hypertension: Secondary | ICD-10-CM | POA: Diagnosis not present

## 2017-09-21 DIAGNOSIS — I2510 Atherosclerotic heart disease of native coronary artery without angina pectoris: Secondary | ICD-10-CM | POA: Insufficient documentation

## 2017-09-21 DIAGNOSIS — D472 Monoclonal gammopathy: Secondary | ICD-10-CM

## 2017-09-21 DIAGNOSIS — Z79899 Other long term (current) drug therapy: Secondary | ICD-10-CM | POA: Insufficient documentation

## 2017-09-21 DIAGNOSIS — D696 Thrombocytopenia, unspecified: Secondary | ICD-10-CM | POA: Insufficient documentation

## 2017-09-21 DIAGNOSIS — Z7984 Long term (current) use of oral hypoglycemic drugs: Secondary | ICD-10-CM | POA: Diagnosis not present

## 2017-09-21 DIAGNOSIS — D7589 Other specified diseases of blood and blood-forming organs: Secondary | ICD-10-CM | POA: Insufficient documentation

## 2017-09-21 NOTE — Progress Notes (Signed)
Diagnosis Monoclonal gammopathy - Plan: CBC with Differential/Platelet, Comprehensive metabolic panel, Lactate dehydrogenase, Beta 2 microglobulin, serum, Kappa/lambda light chains, IgG, IgA, IgM  Staging Cancer Staging No matching staging information was found for the patient.  Assessment and Plan:  1. Smoldering myeloma.  Pt here to go over labs and xrays and bone marrow biopsy.  Labs done 09/05/2017 showed WBC 6 HB 13.7 plts 149,000.    Bone marrow biopsy done 09/05/2017 reviewed  With pt and family and showed  Bone Marrow, Aspirate,Biopsy, and Clot, `right iliac - NORMOCELLULAR BONE MARROW FOR AGE WITH TRILINEAGE HEMATOPOIESIS. - PLASMA CELL NEOPLASM (PLASMA CELLS 12%). - SEE COMMENT. PERIPHERAL BLOOD: - RED BLOOD CELLS WITH MACROCYTOSIS. - MILD THROMBOCYTOPENIA. Diagnosis Note The bone marrow is normocellular for age with trilineage hematopoiesis. Significant myeloid dyspoiesis or increase in blastic cells is not identified. In this background, the plasma cells are increased in number representing 12% of all cells with lack of large aggregates or sheets. Immunohistochemical stains highlight the plasma cell component in the bone marrow which shows kappa light chain restriction consistent with plasma cell neoplasm. Correlation with cytogenetic and FISH studies is recommended.  FISH and cytogenetics were negative.    I have discussed with them that he has 12% Plasma cells which places him in the smoldering myeloma category.  Recent labs done 09/05/2017 were WNL.  Skeletal survey done 08/17/2017 was negative for bone lesions.  He will remain on observation and will RTC in 12/2017 for follow-up and repeat labs.  Risk of progression to myeloma is 10% per year.  Pt was provided written information regarding results and diagnosis.  All questions answered and they expressed understanding of the information presented.    2.  Macrocytosis.  Bone marrow biopsy done 09/05/2017 shows no evidence of  MDS.  He has normal cytogenetics.    3.  Thrombocytopenia.  Review of chart shows pt had low platelets dating back to 2016 with a platelet count of 102,000.  Bone marrow biopsy done 09/05/2017 was negative for MDS.  He had labs done 09/05/2017 that showed plt count was WNL at 149,000.  Will repeat labs in 12/2017.    4.  Hypertension.  Blood pressure is 131/74.  Follow-up with PCP.  5.  Coronary artery disease and pacemaker.  Patient is on warfarin.  Continue INR monitoring to PCP.  I discussed with him he will be given instructions regarding coumadin management prior to bone marrow biopsy.    6.  Trouble swallowing.  Pt has been seen by GI and they have discussed prior workup and made recommendations.  Continue to follow-up as recommended.    Greater than 35 minutes spent in counseling and coordination of care.    Interval history:  Historical data obtained from the note dated 08/17/2017.  72 year old male who was referred by Dr. Chalmers Cater for anemia.  Review of chart shows patient had labs done April 11, 2017 that showed white count 7.1 hemoglobin 14.9 platelets 149,000.  His MCV was 105.  Chemistries were within normal limits other than a potassium of 6 his creatinine was 1.60, liver function tests were normal calcium was normal at 9.7.  There was reported history of monoclonal protein.  Patient denies any blood in his stool or his urine. He reportedly had a colonoscopy done in 2017 that showed no polyps.  He denies any history of blood transfusion.  Wife reported she has concerns about him being on metformin due to potential kidney effects with that medication.  Current Status:  Pt is seen today for follow-up to go over labs, bone marrow biopsy and skeletal survey.  He is accompanied by family.    Problem List Patient Active Problem List   Diagnosis Date Noted  . Precordial pain [R07.2] 03/20/2014  . Complete heart block ([place]) [I44.2] 02/01/2013  . S/P AV nodal ablation [Z98.890]   . Warfarin  anticoagulation [Z79.01]   . CORONARY ATHEROSCLEROSIS NATIVE CORONARY ARTERY [I25.10] 02/01/2008  . Permanent atrial fibrillation ([place]) [I48.2] 02/01/2008  . Cardiac pacemaker in situ [Z95.0] 02/01/2008    Past Medical History Past Medical History:  Diagnosis Date  . Anxiety   . Atrial fibrillation ([place])    Permanent  . Coronary artery disease    Mild nonobstructive 1/12  . DM (diabetes mellitus) ([place])   . GERD (gastroesophageal reflux disease)   . Gout   . Hearing disorder, cochlear   . Hiatal hernia   . HLD (hyperlipidemia)   . HTN (hypertension)   . S/P AV nodal ablation    s/p SJM PPM; gen change 02-01-13 by Dr Rayann Heman  . Warfarin anticoagulation     Past Surgical History Past Surgical History:  Procedure Laterality Date  . CATARACT EXTRACTION Right   . COCHLEAR IMPLANT Right   . HEMORRHOID BANDING    . PACEMAKER GENERATOR CHANGE Bilateral 02/01/2013   Procedure: PACEMAKER GENERATOR CHANGE;  Surgeon: Coralyn Mark, MD;  Location: [facility] CATH [facility];  Service: Cardiovascular;  Laterality: Bilateral;  . PACEMAKER PLACEMENT  2002; 2014   SJM implanted by Dr Lovena Le with AV nodal ablation performed; gen change to Accent SR RF by Dr Rayann Heman 02-01-13  . RETINAL LASER PROCEDURE Left   . SLT LASER APPLICATION Left 91/08/3844   Procedure: SLT LASER APPLICATION;  Surgeon: Williams Che, MD;  Location: [facility];  Service: Ophthalmology;  Laterality: Left;    Family History Family History  Problem Relation Age of Onset  . COPD Mother   . Emphysema Mother   . Cancer - Lung Father   . Heart disease Paternal Uncle   . Stomach cancer Neg Hx   . Colon cancer Neg Hx      Social History  reports that he has never smoked. He has never used smokeless tobacco. He reports that he does not drink alcohol or use drugs.  Medications  Current Outpatient Medications:  .  allopurinol (ZYLOPRIM) 100 MG tablet, Take 100 mg by mouth Daily. , Disp: , Rfl:  .  clonazePAM (KLONOPIN) 0.5 MG tablet,  Take 0.5 mg by mouth daily. , Disp: , Rfl:  .  fish oil-omega-3 fatty acids 1000 MG capsule, Take 1 g by mouth 2 (two) times daily.  , Disp: , Rfl:  .  HUMULIN 70/30 (70-30) 100 UNIT/ML injection, Inject 30 Units into the skin 2 (two) times daily with a meal. , Disp: , Rfl:  .  isosorbide mononitrate (IMDUR) 60 MG 24 hr tablet, Take 120 mg by mouth daily., Disp: , Rfl:  .  loperamide (IMODIUM) 2 MG capsule, Take 1 capsule (2 mg total) by mouth 4 (four) times daily as needed for diarrhea or loose stools., Disp: 12 capsule, Rfl: 0 .  metFORMIN (GLUCOPHAGE) 1000 MG tablet, Take 1,000 mg by mouth 2 (two) times daily with a meal.  , Disp: , Rfl:  .  metoprolol succinate (TOPROL-XL) 25 MG 24 hr tablet, Take 0.5 tablets by mouth Daily., Disp: , Rfl:  .  nitroGLYCERIN (NITROSTAT) 0.4 MG SL tablet, Place 1 tablet (0.4  mg total) under the tongue every 5 (five) minutes x 3 doses as needed., Disp: 25 tablet, Rfl: 3 .  ONETOUCH VERIO test strip, , Disp: , Rfl:  .  ramipril (ALTACE) 5 MG capsule, Take 5 mg by mouth daily.  , Disp: , Rfl:  .  ranitidine (ZANTAC) 150 MG tablet, Take 1 tablet by mouth 2 (two) times daily., Disp: , Rfl:  .  sertraline (ZOLOFT) 50 MG tablet, Take 50 mg by mouth daily. Take 1  tab daily, Disp: , Rfl:  .  simvastatin (ZOCOR) 40 MG tablet, Take 40 mg by mouth at bedtime.  , Disp: , Rfl:  .  warfarin (COUMADIN) 5 MG tablet, Take 5-7.5 mg by mouth daily. 7.5 mg on Monday and Friday and 5 mg all other days (MANAGED BY PMD), Disp: , Rfl:   Allergies Contrast media [iodinated diagnostic agents]; Shellfish allergy; and Sulfonamide derivatives  Review of Systems Review of Systems - Oncology ROS negative   Physical Exam  Vitals Wt Readings from Last 3 Encounters:  09/21/17 199 lb 8 oz (90.5 kg)  09/05/17 200 lb (90.7 kg)  09/05/17 200 lb (90.7 kg)   Temp Readings from Last 3 Encounters:  09/21/17 98.2 F (36.8 C) (Oral)  09/05/17 97.7 F (36.5 C)  09/05/17 97.8 F (36.6 C)  (Oral)   BP Readings from Last 3 Encounters:  09/21/17 131/74  09/05/17 139/64  09/05/17 140/76   Pulse Readings from Last 3 Encounters:  09/21/17 (!) 59  09/05/17 62  09/05/17 60    Constitutional: Well-developed, well-nourished, and in no distress.   HENT: Head: Normocephalic and atraumatic.  Mouth/Throat: No oropharyngeal exudate. Mucosa moist. Eyes: Pupils are equal, round, and reactive to light. Conjunctivae are normal. No scleral icterus.  Neck: Normal range of motion. Neck supple. No JVD present.  Cardiovascular: Normal rate, regular rhythm and normal heart sounds.  Exam reveals no gallop and no friction rub.   No murmur heard. Pulmonary/Chest: Effort normal and breath sounds normal. No respiratory distress. No wheezes.No rales.  Abdominal: Soft. Bowel sounds are normal. No distension. There is no tenderness. There is no guarding.  Musculoskeletal: No edema or tenderness. No hip discomfort at bone marrow biopsy site.   Lymphadenopathy: No cervical, axillary or supraclavicular adenopathy.  Neurological: Alert and oriented to person, place, and time. No cranial nerve deficit.  Skin: Skin is warm and dry. No rash noted. No erythema. No pallor.  Psychiatric: Affect and judgment normal.   Labs No visits with results within 3 Day(s) from this visit.  Latest known visit with results is:  Hospital Outpatient Visit on 09/05/2017  Component Date Value Ref Range Status  . WBC 09/05/2017 6.1  4.0 - 10.5 K/uL Final  . RBC 09/05/2017 3.82* 4.22 - 5.81 MIL/uL Final  . Hemoglobin 09/05/2017 13.7  13.0 - 17.0 g/dL Final  . HCT 09/05/2017 40.5  39.0 - 52.0 % Final  . MCV 09/05/2017 106.0* 78.0 - 100.0 fL Final  . MCH 09/05/2017 35.9* 26.0 - 34.0 pg Final  . MCHC 09/05/2017 33.8  30.0 - 36.0 g/dL Final  . RDW 09/05/2017 13.4  11.5 - 15.5 % Final  . Platelets 09/05/2017 149* 150 - 400 K/uL Final  . Neutrophils Relative % 09/05/2017 61  % Final  . Neutro Abs 09/05/2017 3.7  1.7 - 7.7  K/uL Final  . Lymphocytes Relative 09/05/2017 29  % Final  . Lymphs Abs 09/05/2017 1.8  0.7 - 4.0 K/uL Final  . Monocytes Relative 09/05/2017  8  % Final  . Monocytes Absolute 09/05/2017 0.5  0.1 - 1.0 K/uL Final  . Eosinophils Relative 09/05/2017 2  % Final  . Eosinophils Absolute 09/05/2017 0.1  0.0 - 0.7 K/uL Final  . Basophils Relative 09/05/2017 0  % Final  . Basophils Absolute 09/05/2017 0.0  0.0 - 0.1 K/uL Final   Performed at [facility] [address], [place], [place] [address]  . Prothrombin Time 09/05/2017 14.6  11.4 - 15.2 seconds Final  . INR 09/05/2017 1.15   Final   Performed at [facility], [place] [address], [place], [place] [address]     Pathology Orders Placed This Encounter  Procedures  . CBC with Differential/Platelet    Standing Status:   Future    Standing Expiration Date:   09/22/2018  . Comprehensive metabolic panel    Standing Status:   Future    Standing Expiration Date:   09/22/2018  . Lactate dehydrogenase    Standing Status:   Future    Standing Expiration Date:   09/22/2018  . Beta 2 microglobulin, serum    Standing Status:   Future    Standing Expiration Date:   09/22/2018  . Kappa/lambda light chains    Standing Status:   Future    Standing Expiration Date:   09/22/2018  . IgG, IgA, IgM    Standing Status:   Future    Standing Expiration Date:   09/22/2018       Zoila Shutter MD

## 2017-10-16 ENCOUNTER — Ambulatory Visit (INDEPENDENT_AMBULATORY_CARE_PROVIDER_SITE_OTHER): Payer: Medicare Other | Admitting: *Deleted

## 2017-10-16 DIAGNOSIS — I4891 Unspecified atrial fibrillation: Secondary | ICD-10-CM

## 2017-10-16 DIAGNOSIS — I442 Atrioventricular block, complete: Secondary | ICD-10-CM

## 2017-10-17 NOTE — Progress Notes (Signed)
Remote pacemaker transmission.   

## 2017-11-14 LAB — CUP PACEART REMOTE DEVICE CHECK
Battery Remaining Longevity: 140 mo
Brady Statistic RV Percent Paced: 99 %
Implantable Lead Location: 753860
Lead Channel Pacing Threshold Pulse Width: 0.4 ms
Lead Channel Setting Pacing Amplitude: 1.5 V
Lead Channel Setting Pacing Pulse Width: 0.4 ms
Lead Channel Setting Sensing Sensitivity: 6 mV
MDC IDC LEAD IMPLANT DT: 20020215
MDC IDC MSMT BATTERY REMAINING PERCENTAGE: 95.5 %
MDC IDC MSMT BATTERY VOLTAGE: 3.01 V
MDC IDC MSMT LEADCHNL RV IMPEDANCE VALUE: 610 Ohm
MDC IDC MSMT LEADCHNL RV PACING THRESHOLD AMPLITUDE: 1.25 V
MDC IDC MSMT LEADCHNL RV SENSING INTR AMPL: 12 mV
MDC IDC PG IMPLANT DT: 20141121
MDC IDC SESS DTM: 20190805060310
Pulse Gen Serial Number: 7488472

## 2017-12-21 ENCOUNTER — Telehealth (HOSPITAL_COMMUNITY): Payer: Self-pay | Admitting: *Deleted

## 2017-12-21 ENCOUNTER — Inpatient Hospital Stay (HOSPITAL_COMMUNITY): Payer: Medicare Other | Attending: Hematology

## 2017-12-21 DIAGNOSIS — Z8249 Family history of ischemic heart disease and other diseases of the circulatory system: Secondary | ICD-10-CM | POA: Insufficient documentation

## 2017-12-21 DIAGNOSIS — Z950 Presence of cardiac pacemaker: Secondary | ICD-10-CM | POA: Insufficient documentation

## 2017-12-21 DIAGNOSIS — D6959 Other secondary thrombocytopenia: Secondary | ICD-10-CM | POA: Insufficient documentation

## 2017-12-21 DIAGNOSIS — D7589 Other specified diseases of blood and blood-forming organs: Secondary | ICD-10-CM | POA: Insufficient documentation

## 2017-12-21 DIAGNOSIS — C9000 Multiple myeloma not having achieved remission: Secondary | ICD-10-CM | POA: Diagnosis not present

## 2017-12-21 DIAGNOSIS — E875 Hyperkalemia: Secondary | ICD-10-CM | POA: Insufficient documentation

## 2017-12-21 DIAGNOSIS — D649 Anemia, unspecified: Secondary | ICD-10-CM | POA: Diagnosis not present

## 2017-12-21 DIAGNOSIS — I4821 Permanent atrial fibrillation: Secondary | ICD-10-CM | POA: Insufficient documentation

## 2017-12-21 DIAGNOSIS — I442 Atrioventricular block, complete: Secondary | ICD-10-CM | POA: Insufficient documentation

## 2017-12-21 DIAGNOSIS — D472 Monoclonal gammopathy: Secondary | ICD-10-CM

## 2017-12-21 DIAGNOSIS — Z7901 Long term (current) use of anticoagulants: Secondary | ICD-10-CM | POA: Insufficient documentation

## 2017-12-21 LAB — CBC WITH DIFFERENTIAL/PLATELET
Abs Immature Granulocytes: 0.01 10*3/uL (ref 0.00–0.07)
BASOS ABS: 0 10*3/uL (ref 0.0–0.1)
Basophils Relative: 1 %
EOS PCT: 1 %
Eosinophils Absolute: 0.1 10*3/uL (ref 0.0–0.5)
HCT: 35.5 % — ABNORMAL LOW (ref 39.0–52.0)
HEMOGLOBIN: 11.9 g/dL — AB (ref 13.0–17.0)
IMMATURE GRANULOCYTES: 0 %
LYMPHS ABS: 1.2 10*3/uL (ref 0.7–4.0)
LYMPHS PCT: 26 %
MCH: 36.2 pg — ABNORMAL HIGH (ref 26.0–34.0)
MCHC: 33.5 g/dL (ref 30.0–36.0)
MCV: 107.9 fL — ABNORMAL HIGH (ref 80.0–100.0)
MONOS PCT: 8 %
Monocytes Absolute: 0.4 10*3/uL (ref 0.1–1.0)
NEUTROS PCT: 64 %
NRBC: 0 % (ref 0.0–0.2)
Neutro Abs: 2.9 10*3/uL (ref 1.7–7.7)
Platelets: 94 10*3/uL — ABNORMAL LOW (ref 150–400)
RBC: 3.29 MIL/uL — ABNORMAL LOW (ref 4.22–5.81)
RDW: 12.6 % (ref 11.5–15.5)
WBC: 4.6 10*3/uL (ref 4.0–10.5)

## 2017-12-21 LAB — COMPREHENSIVE METABOLIC PANEL
ALBUMIN: 3.7 g/dL (ref 3.5–5.0)
ALK PHOS: 93 U/L (ref 38–126)
ALT: 31 U/L (ref 0–44)
AST: 31 U/L (ref 15–41)
Anion gap: 4 — ABNORMAL LOW (ref 5–15)
BUN: 21 mg/dL (ref 8–23)
CALCIUM: 9.1 mg/dL (ref 8.9–10.3)
CO2: 25 mmol/L (ref 22–32)
CREATININE: 1.17 mg/dL (ref 0.61–1.24)
Chloride: 106 mmol/L (ref 98–111)
GFR calc Af Amer: >60 mL/min (ref >60)
GFR calc non Af Amer: 60 mL/min — ABNORMAL LOW (ref >60)
Glucose, Bld: 264 mg/dL — ABNORMAL HIGH (ref 70–99)
Potassium: 6.2 mmol/L — ABNORMAL HIGH (ref 3.5–5.1)
Sodium: 135 mmol/L (ref 135–145)
TOTAL PROTEIN: 7.8 g/dL (ref 6.5–8.1)
Total Bilirubin: 0.8 mg/dL (ref 0.3–1.2)

## 2017-12-21 LAB — LACTATE DEHYDROGENASE: LDH: 130 U/L (ref 98–192)

## 2017-12-21 NOTE — Telephone Encounter (Signed)
LMOM stating that the Jay Campbell's potassium is elevated and that Dr. Walden Field wanted him to take 40mg  of Lasix once a day for 4 days and follow up with his PCP or Kidney doctor. Ask for the Jay Campbell to call back if there were any questions.

## 2017-12-22 LAB — BETA 2 MICROGLOBULIN, SERUM: Beta-2 Microglobulin: 2.2 mg/L (ref 0.6–2.4)

## 2017-12-22 LAB — IGG, IGA, IGM
IgA: 159 mg/dL (ref 61–437)
IgG (Immunoglobin G), Serum: 2141 mg/dL — ABNORMAL HIGH (ref 700–1600)
IgM (Immunoglobulin M), Srm: 127 mg/dL (ref 15–143)

## 2017-12-22 NOTE — Progress Notes (Unsigned)
Late entry- CRITICAL VALUE ALERT Critical value received:  K+ 6.2 Date of notification:  12-21-2017 Time of notification: 1200 Critical value read back:  Yes.   Nurse who received alert:  Forest Gleason RN MD notified (1st Ladale Sherburn):  DR. Delton Coombes

## 2017-12-25 LAB — KAPPA/LAMBDA LIGHT CHAINS
Kappa free light chain: 46.1 mg/L — ABNORMAL HIGH (ref 3.3–19.4)
Kappa, lambda light chain ratio: 2.38 — ABNORMAL HIGH (ref 0.26–1.65)
LAMDA FREE LIGHT CHAINS: 19.4 mg/L (ref 5.7–26.3)

## 2017-12-29 ENCOUNTER — Other Ambulatory Visit: Payer: Self-pay

## 2017-12-29 ENCOUNTER — Inpatient Hospital Stay (HOSPITAL_COMMUNITY): Payer: Medicare Other | Admitting: Oncology

## 2017-12-29 ENCOUNTER — Encounter (HOSPITAL_COMMUNITY): Payer: Self-pay | Admitting: Oncology

## 2017-12-29 VITALS — BP 144/63 | HR 63 | Temp 97.8°F | Resp 18 | Wt 204.4 lb

## 2017-12-29 DIAGNOSIS — C9000 Multiple myeloma not having achieved remission: Secondary | ICD-10-CM | POA: Insufficient documentation

## 2017-12-29 DIAGNOSIS — D6959 Other secondary thrombocytopenia: Secondary | ICD-10-CM | POA: Diagnosis not present

## 2017-12-29 DIAGNOSIS — E875 Hyperkalemia: Secondary | ICD-10-CM

## 2017-12-29 DIAGNOSIS — Z950 Presence of cardiac pacemaker: Secondary | ICD-10-CM

## 2017-12-29 DIAGNOSIS — Z7901 Long term (current) use of anticoagulants: Secondary | ICD-10-CM

## 2017-12-29 DIAGNOSIS — I442 Atrioventricular block, complete: Secondary | ICD-10-CM

## 2017-12-29 DIAGNOSIS — D649 Anemia, unspecified: Secondary | ICD-10-CM

## 2017-12-29 DIAGNOSIS — D7589 Other specified diseases of blood and blood-forming organs: Secondary | ICD-10-CM

## 2017-12-29 DIAGNOSIS — D472 Monoclonal gammopathy: Secondary | ICD-10-CM | POA: Insufficient documentation

## 2017-12-29 DIAGNOSIS — I4821 Permanent atrial fibrillation: Secondary | ICD-10-CM

## 2017-12-29 DIAGNOSIS — Z8249 Family history of ischemic heart disease and other diseases of the circulatory system: Secondary | ICD-10-CM

## 2017-12-29 HISTORY — DX: Multiple myeloma not having achieved remission: C90.00

## 2017-12-29 HISTORY — DX: Monoclonal gammopathy: D47.2

## 2017-12-29 NOTE — Progress Notes (Signed)
t       Monico Blitz, MD [place] [address]  Smoldering multiple myeloma [facility]) - Plan: CANCELED: CBC with Differential, CANCELED: Multiple Myeloma Panel (SPEP&IFE w/QIG), CANCELED: Kappa/lambda light chains, CANCELED: Beta 2 microglobuline, serum, CANCELED: IgG, IgA, IgM, CANCELED: Immunofixation electrophoresis, CANCELED: Comprehensive metabolic panel, CANCELED: Lactate dehydrogenase  Hyperkalemia - Plan: CANCELED: Comprehensive metabolic panel  Permanent atrial fibrillation  Complete heart block (HCC)  Cardiac pacemaker in situ  Warfarin anticoagulation   HISTORY OF PRESENT ILLNESS: IgG kappa smoldering myeloma, S/P bone marrow aspiration and biopsy on 09/05/2017 showing 12% plasma cells, macrocytic RBC, and mild thrombocytopenia in the setting of normal renal function, normal calcium, normal hemoglobin, and normal skeletal survey. AND Hyperkalemia, etiology unclear.  He is on an ACE-inhibitor which could be the cause.  CURRENT THERAPY: Observation/Surveillance  CURRENT STATUS: Jay Campbell 72 y.o. male returns for followup of smoldering myeloma.  He remains asymptomatic.  He denies any new pain.  He denies any new lumps or bumps on his own examination.  He denies any B symptoms.  His appetite is stable and his weight is stable.  He denies any recurrent infections.  His wife has a history of adrenal cancer and unfortunately, she had scans approximately 2 weeks ago that show that her cancer has returned and has progressed with what sounds like a pancreatic lesion, hepatic lesion, and intra-abdominal lesion in addition to adrenal mass.  She is expecting to undergo radiation therapy to soft tissue intra-abdominal mass followed by surgical resection.  As a result of the above, the patient is under significant amount of stress.  Review of Systems  Constitutional: Positive for malaise/fatigue. Negative for chills, fever and weight loss.  HENT: Negative.   Eyes:  Negative.   Respiratory: Negative.  Negative for cough.   Cardiovascular: Negative.  Negative for chest pain.  Gastrointestinal: Negative.  Negative for blood in stool, constipation, diarrhea, melena, nausea and vomiting.  Genitourinary: Negative.   Musculoskeletal: Negative.   Skin: Negative.   Neurological: Negative.  Negative for weakness.  Endo/Heme/Allergies: Negative.   Psychiatric/Behavioral: Negative.     Past Medical History:  Diagnosis Date  . Anxiety   . Atrial fibrillation ([place])    Permanent  . Coronary artery disease    Mild nonobstructive 1/12  . DM (diabetes mellitus) ([place])   . GERD (gastroesophageal reflux disease)   . Gout   . Hearing disorder, cochlear   . Hiatal hernia   . HLD (hyperlipidemia)   . HTN (hypertension)   . S/P AV nodal ablation    s/p SJM PPM; gen change 02-01-13 by Dr Rayann Heman  . Smoldering multiple myeloma ([place]) 12/29/2017  . Warfarin anticoagulation     Past Surgical History:  Procedure Laterality Date  . CATARACT EXTRACTION Right   . COCHLEAR IMPLANT Right   . HEMORRHOID BANDING    . PACEMAKER GENERATOR CHANGE Bilateral 02/01/2013   Procedure: PACEMAKER GENERATOR CHANGE;  Surgeon: Coralyn Mark, MD;  Location: [facility] CATH LAB;  Service: Cardiovascular;  Laterality: Bilateral;  . PACEMAKER PLACEMENT  2002; 2014   SJM implanted by Dr Lovena Le with AV nodal ablation performed; gen change to Accent SR RF by Dr Rayann Heman 02-01-13  . RETINAL LASER PROCEDURE Left   . SLT LASER APPLICATION Left 22/04/9796   Procedure: SLT LASER APPLICATION;  Surgeon: Williams Che, MD;  Location: [facility];  Service: Ophthalmology;  Laterality: Left;    Family History  Problem Relation Age of Onset  . COPD  Mother   . Emphysema Mother   . Cancer - Lung Father   . Heart disease Paternal Uncle   . Stomach cancer Neg Hx   . Colon cancer Neg Hx     Social History   Socioeconomic History  . Marital status: Married    Spouse name: Not on file  . Number of  children: Not on file  . Years of education: Not on file  . Highest education level: Not on file  Occupational History  . Not on file  Social Needs  . Financial resource strain: Not on file  . Food insecurity:    Worry: Not on file    Inability: Not on file  . Transportation needs:    Medical: Not on file    Non-medical: Not on file  Tobacco Use  . Smoking status: Never Smoker  . Smokeless tobacco: Never Used  Substance and Sexual Activity  . Alcohol use: No    Alcohol/week: 0.0 standard drinks  . Drug use: No  . Sexual activity: Not on file  Lifestyle  . Physical activity:    Days per week: Not on file    Minutes per session: Not on file  . Stress: Not on file  Relationships  . Social connections:    Talks on phone: Not on file    Gets together: Not on file    Attends religious service: Not on file    Active member of club or organization: Not on file    Attends meetings of clubs or organizations: Not on file    Relationship status: Not on file  Other Topics Concern  . Not on file  Social History Narrative  . Not on file     PHYSICAL EXAMINATION  ECOG PERFORMANCE STATUS: 1 - Symptomatic but completely ambulatory  Vitals:   12/29/17 1339  BP: (!) 144/63  Pulse: 63  Resp: 18  Temp: 97.8 F (36.6 C)  SpO2: 99%    GENERAL:alert, no distress, well nourished, well developed, comfortable, cooperative and smiling, accompanied with his wife SKIN: skin color, texture, turgor are normal, no rashes or significant lesions HEAD: Normocephalic, No masses, lesions, tenderness or abnormalities EYES: normal, EOMI, Conjunctiva are pink and non-injected EARS: External ears normal OROPHARYNX:lips, buccal mucosa, and tongue normal and mucous membranes are moist  NECK: supple, no adenopathy, non-tender LYMPH:  no palpable lymphadenopathy, no hepatosplenomegaly BREAST:not examined LUNGS: clear to auscultation  HEART: irregular rate & rhythm, no murmurs and no gallops,  pacemaker in place ABDOMEN:abdomen soft, non-tender, obese and normal bowel sounds BACK: Back symmetric, no curvature. EXTREMITIES:less then 2 second capillary refill, no joint deformities, effusion, or inflammation, no edema, no skin discoloration, no clubbing, no cyanosis  NEURO: alert & oriented x 3 with fluent speech, no focal motor/sensory deficits, gait normal   LABORATORY DATA: CBC    Component Value Date/Time   WBC 4.6 12/21/2017 1322   RBC 3.29 (L) 12/21/2017 1322   HGB 11.9 (L) 12/21/2017 1322   HCT 35.5 (L) 12/21/2017 1322   PLT 94 (L) 12/21/2017 1322   MCV 107.9 (H) 12/21/2017 1322   MCH 36.2 (H) 12/21/2017 1322   MCHC 33.5 12/21/2017 1322   RDW 12.6 12/21/2017 1322   LYMPHSABS 1.2 12/21/2017 1322   MONOABS 0.4 12/21/2017 1322   EOSABS 0.1 12/21/2017 1322   BASOSABS 0.0 12/21/2017 1322      Chemistry      Component Value Date/Time   NA 135 12/21/2017 1322   K 6.2 (H) 12/21/2017  1322   CL 106 12/21/2017 1322   CO2 25 12/21/2017 1322   BUN 21 12/21/2017 1322   CREATININE 1.17 12/21/2017 1322      Component Value Date/Time   CALCIUM 9.1 12/21/2017 1322   ALKPHOS 93 12/21/2017 1322   AST 31 12/21/2017 1322   ALT 31 12/21/2017 1322   BILITOT 0.8 12/21/2017 1322       RADIOGRAPHIC STUDIES:  No results found.   PATHOLOGY:    ASSESSMENT AND PLAN:  Smoldering multiple myeloma (HCC) IgG kappa smoldering myeloma, S/P bone marrow aspiration and biopsy on 09/05/2017 showing 12% plasma cells, macrocytic RBC, and mild thrombocytopenia in the setting of normal renal function, normal calcium, normal hemoglobin, and normal skeletal survey.  There is a 10% risk of progression to active multiple myeloma per year.  Labs on 12/21/2017 reviewed: CBC diff, CMET, LDH, SPEP + IFE, light chain assay, and B2M.  I personally reviewed and went over laboratory results with the patient.  The results are noted within this dictation.  Blood counts at that time demonstrate a white  blood cell count of 4.6, minimal anemia at 11.9 g/dL with a macrocytosis of 107.9 and a moderate thrombocytopenia at 94,000.  Hyperkalemia was noted at 6.2.  He is on an ACE inhibitor.  SPEP demonstrated an IgG of 2141 and a kappa light chain of 46.1 and ratio of 2.38.  Labs in 3 months: CBC diff, CMET, LDH, SPEP + IFE, light chain assay, and B2M.    Next surveillance skeletal survey will be due in 08/2018, sooner if clinical indicated.  Return in 3 months for follow-up.  Will need to monitor for progression of disease to MM and monitor for CRAB findings: C= Calcium increase of greater than 11.5 mg/dL R = Renal insufficiency with creatinine > 2 mg/dL A= Anemia with Hgb < 10 g/dL B= Bone disease with lytic lesions and/or osteopenia   Smoldering multiple myeloma - Smoldering multiple myeloma (SMM) is defined as:  ?M-protein ?3 g/dL and/or 10 to 60 percent bone marrow plasma cells, plus  ?No end-organ damage or other myeloma-defining events, and no amyloidosis  Thus, for the diagnosis of SMM, patients should not have any of the following myeloma-defining events:  ?End-organ damage (lytic lesions, anemia, renal disease, or hypercalcemia) that can be attributed to the underlying plasma cell disorder  ??60 percent clonal plasma cells in the bone marrow   ?Involved/uninvolved free light chain (FLC) ratio of 100 or more   ?Magnetic resonance imaging (MRI) with more than one focal lesion (involving bone or bone marrow) Asymptomatic patients with one or more of the myeloma-defining events listed above are considered to have MM rather than SMM because they have a risk of progression with complications of greater than 80 percent within two years.     2. Hyperkalemia Noted from laboratory work on 12/21/2017 showing a potassium of 6.2.  Renal function is stable.  He is on ACE inhibitor and given his cardiac history, I have not manipulated any of his cardiac medications.  I have messaged his cardiologist,  Dr. Domenic Polite, and will defer any changes to his cardiologist if needed.  3. Permanent atrial fibrillation Noted on auscultation today.  Heart rate was 63.  4. Complete heart block (HCC) Noted, pacemaker in place  5. Cardiac pacemaker in situ Noted, left chest  6. Warfarin anticoagulation On vitamin K antagonist therapy.   ORDERS PLACED FOR THIS ENCOUNTER: No orders of the defined types were placed in this encounter.  MEDICATIONS PRESCRIBED THIS ENCOUNTER: No orders of the defined types were placed in this encounter.   THERAPY PLAN:  Surveillance and monitoring for transition to active myeloma.  All questions were answered. The patient knows to call the clinic with any problems, questions or concerns. We can certainly see the patient much sooner if necessary.  Patient and plan discussed with Dr. Derek Jack and she is in agreement with the aforementioned.   This note is electronically signed by: Robynn Pane, PA-C 12/29/2017 3:07 PM

## 2017-12-29 NOTE — Assessment & Plan Note (Addendum)
IgG kappa smoldering myeloma, S/P bone marrow aspiration and biopsy on 09/05/2017 showing 12% plasma cells, macrocytic RBC, and mild thrombocytopenia in the setting of normal renal function, normal calcium, normal hemoglobin, and normal skeletal survey.  There is a 10% risk of progression to active multiple myeloma per year.  Labs on 12/21/2017 reviewed: CBC diff, CMET, LDH, SPEP + IFE, light chain assay, and B2M.  I personally reviewed and went over laboratory results with the patient.  The results are noted within this dictation.  Blood counts at that time demonstrate a white blood cell count of 4.6, minimal anemia at 11.9 g/dL with a macrocytosis of 107.9 and a moderate thrombocytopenia at 94,000.  Hyperkalemia was noted at 6.2.  He is on an ACE inhibitor.  SPEP demonstrated an IgG of 2141 and a kappa light chain of 46.1 and ratio of 2.38.  Labs in 3 months: CBC diff, CMET, LDH, SPEP + IFE, light chain assay, and B2M.    Next surveillance skeletal survey will be due in 08/2018, sooner if clinical indicated.  Return in 3 months for follow-up.  Will need to monitor for progression of disease to MM and monitor for CRAB findings: C= Calcium increase of greater than 11.5 mg/dL R = Renal insufficiency with creatinine > 2 mg/dL A= Anemia with Hgb < 10 g/dL B= Bone disease with lytic lesions and/or osteopenia   Smoldering multiple myeloma - Smoldering multiple myeloma (SMM) is defined as:  ?M-protein ?3 g/dL and/or 10 to 60 percent bone marrow plasma cells, plus  ?No end-organ damage or other myeloma-defining events, and no amyloidosis  Thus, for the diagnosis of SMM, patients should not have any of the following myeloma-defining events:  ?End-organ damage (lytic lesions, anemia, renal disease, or hypercalcemia) that can be attributed to the underlying plasma cell disorder  ??60 percent clonal plasma cells in the bone marrow   ?Involved/uninvolved free light chain (FLC) ratio of 100 or more    ?Magnetic resonance imaging (MRI) with more than one focal lesion (involving bone or bone marrow) Asymptomatic patients with one or more of the myeloma-defining events listed above are considered to have MM rather than SMM because they have a risk of progression with complications of greater than 80 percent within two years.

## 2017-12-29 NOTE — Patient Instructions (Addendum)
[place] at [facility] Discharge Instructions  Today you saw Kirby Crigler PA-C. Labs last week are very stable. Labs in 3 months- about 1-2 weeks prior to follow-up visit. Return in 3 months for follow-up.   Thank you for choosing [place] at [facility] to provide your oncology and hematology care.  To afford each patient quality time with our provider, please arrive at least 15 minutes before your scheduled appointment time.   If you have a lab appointment with the [facility] please come in thru the  Main Entrance and check in at the main information desk  You need to re-schedule your appointment should you arrive 10 or more minutes late.  We strive to give you quality time with our providers, and arriving late affects you and other patients whose appointments are after yours.  Also, if you no show three or more times for appointments you may be dismissed from the clinic at the providers discretion.     Again, thank you for choosing [facility].  Our hope is that these requests will decrease the amount of time that you wait before being seen by our physicians.       _____________________________________________________________  Should you have questions after your visit to [facility], please contact our office at [phone] between the hours of 8:00 a.m. and 4:30 p.m.  Voicemails left after 4:00 p.m. will not be returned until the following business day.  For prescription refill requests, have your pharmacy contact our office and allow 72 hours.    Cancer Center Support Programs:   > Cancer Support Group  2nd Tuesday of the month 1pm-2pm, Journey Room

## 2018-01-01 ENCOUNTER — Telehealth: Payer: Self-pay | Admitting: *Deleted

## 2018-01-01 NOTE — Telephone Encounter (Signed)
-----   Message from Laurine Blazer, LPN sent at 36/68/1594  8:54 AM EDT -----   ----- Message ----- From: Bernita Raisin, RN Sent: 01/01/2018   7:02 AM EDT To: Laurine Blazer, LPN, Massie Maroon, CMA  [place] pt ----- Message ----- From: Satira Sark, MD Sent: 12/31/2017   1:25 PM EDT To: Derek Jack, MD, #  Chart reviewed. I have not seen Mr. Bonelli since November of last year. We see him for permanent atrial fibrillation, not blood pressure management specifically. His PCP is Dr. Manuella Ghazi in [place] - he needs to be informed of the patient's potassium levels. Patient is on Altace which I would suggest holding for now and then get a follow-up potassium level. I will forward to our nursing staff to ask them to contact the patient, have him hold Altace, and then communicate with [facility] (Dr. Manuella Ghazi) so that the patient can come in this week at their office for a repeat potassium level.  ----- Message ----- From: Baird Cancer, PA-C Sent: 12/29/2017   2:18 PM EDT To: Derek Jack, MD, Satira Sark, MD, #  Patient seen today in clinic.  I noted his K+ was elevated on 10/10/219.  Renal function is good.  Not on K+ supplement.  I noticed that he is on ACE-inhibitor.  This may be cause.  Given his cardiac history, I did not alter his cardiac meds.  I will defer to you whether his ACE inhibitor needs HELD.  His K+ is not at a critical value (but not too far off).  I will not be back in the clinic for some time, so if there are questions/concerns, please direct this to Francene Finders, NP or Derek Jack, MD.  Leda Gauze

## 2018-01-03 NOTE — Telephone Encounter (Signed)
Wife informed and said that patient is scheduled to see Nicanor Bake on Monday 01/08/18. Called and left detailed message on vm at [facility] office with this information and a copy has been faxed.

## 2018-01-15 ENCOUNTER — Ambulatory Visit (INDEPENDENT_AMBULATORY_CARE_PROVIDER_SITE_OTHER): Payer: Medicare Other | Admitting: *Deleted

## 2018-01-15 DIAGNOSIS — I4891 Unspecified atrial fibrillation: Secondary | ICD-10-CM

## 2018-01-15 DIAGNOSIS — I442 Atrioventricular block, complete: Secondary | ICD-10-CM

## 2018-01-17 NOTE — Progress Notes (Signed)
Remote pacemaker transmission.   

## 2018-01-18 ENCOUNTER — Encounter: Payer: Self-pay | Admitting: Cardiology

## 2018-01-24 NOTE — Progress Notes (Signed)
Cardiology Office Note  Date: 01/25/2018   ID: Jay Campbell, DOB 1945/12/05, MRN [id number]  PCP: Monico Blitz, MD  Primary Cardiologist: Rozann Lesches, MD   Chief Complaint  Patient presents with  . Atrial Fibrillation    History of Present Illness: Jay Campbell is a 72 y.o. male last seen in November 2018.  He is here today with his wife for a follow-up visit.  From a cardiac perspective, he does not report any sense of palpitations or chest pain.  He and his wife have both had other health problems.  He is being treated for multiple myeloma, and his wife has a recurrent sarcoma undergoing radiation with plan for surgery in December at [facility].  Recently he had lab work done through oncology showing hyperkalemia.  He had been on Altace for treatment of hypertension, I recommended that he stop it and see his PCP for further evaluation.  He sees Dr. Rayann Heman in the device clinic, status post AV node ablation with St. Jude pacemaker in place.  He is on Coumadin with follow-up per Dr. Manuella Ghazi.  He does not report any bleeding problems.  He continues on metoprolol.  Past Medical History:  Diagnosis Date  . Anxiety   . Atrial fibrillation ([place])    Permanent  . Coronary artery disease    Mild nonobstructive 1/12  . DM (diabetes mellitus) ([place])   . GERD (gastroesophageal reflux disease)   . Gout   . Hearing disorder, cochlear   . Hiatal hernia   . HLD (hyperlipidemia)   . HTN (hypertension)   . S/P AV nodal ablation    s/p SJM PPM; gen change 02-01-13 by Dr Rayann Heman  . Smoldering multiple myeloma ([place]) 12/29/2017  . Warfarin anticoagulation     Past Surgical History:  Procedure Laterality Date  . CATARACT EXTRACTION Right   . COCHLEAR IMPLANT Right   . HEMORRHOID BANDING    . PACEMAKER GENERATOR CHANGE Bilateral 02/01/2013   Procedure: PACEMAKER GENERATOR CHANGE;  Surgeon: Coralyn Mark, MD;  Location: [facility] CATH [facility];  Service: Cardiovascular;  Laterality: Bilateral;  .  PACEMAKER PLACEMENT  2002; 2014   SJM implanted by Dr Lovena Le with AV nodal ablation performed; gen change to Accent SR RF by Dr Rayann Heman 02-01-13  . RETINAL LASER PROCEDURE Left   . SLT LASER APPLICATION Left 41/09/4079   Procedure: SLT LASER APPLICATION;  Surgeon: Williams Che, MD;  Location: [facility];  Service: Ophthalmology;  Laterality: Left;    Current Outpatient Medications  Medication Sig Dispense Refill  . allopurinol (ZYLOPRIM) 100 MG tablet Take 100 mg by mouth Daily.     . clonazePAM (KLONOPIN) 0.5 MG tablet Take 0.5 mg by mouth daily.     . fish oil-omega-3 fatty acids 1000 MG capsule Take 1 g by mouth 2 (two) times daily.      Marland Kitchen HUMULIN 70/30 (70-30) 100 UNIT/ML injection Inject 30 Units into the skin 2 (two) times daily with a meal.     . isosorbide mononitrate (IMDUR) 60 MG 24 hr tablet Take 120 mg by mouth daily.    Marland Kitchen loperamide (IMODIUM) 2 MG capsule Take 1 capsule (2 mg total) by mouth 4 (four) times daily as needed for diarrhea or loose stools. 12 capsule 0  . metFORMIN (GLUCOPHAGE) 1000 MG tablet Take 1,000 mg by mouth 2 (two) times daily with a meal.      . metoprolol succinate (TOPROL-XL) 25 MG 24 hr tablet Take 0.5 tablets by mouth  Daily.    . nitroGLYCERIN (NITROSTAT) 0.4 MG SL tablet Place 1 tablet (0.4 mg total) under the tongue every 5 (five) minutes x 3 doses as needed. 25 tablet 3  . ONETOUCH VERIO test strip     . ranitidine (ZANTAC) 150 MG tablet Take 1 tablet by mouth 2 (two) times daily.    . sertraline (ZOLOFT) 50 MG tablet Take 50 mg by mouth daily. Take 1  tab daily    . simvastatin (ZOCOR) 40 MG tablet Take 40 mg by mouth at bedtime.      Marland Kitchen warfarin (COUMADIN) 5 MG tablet Take 5-7.5 mg by mouth daily. 7.5 mg on Monday and Friday and 5 mg all other days (MANAGED BY PMD)     No current facility-administered medications for this visit.    Allergies:  Contrast media [iodinated diagnostic agents]; Shellfish allergy; and Sulfonamide derivatives   Social  History: The patient  reports that he has never smoked. He has never used smokeless tobacco. He reports that he does not drink alcohol or use drugs.   ROS:  Please see the history of present illness. Otherwise, complete review of systems is positive for none.  All other systems are reviewed and negative.   Physical Exam: VS:  BP 132/68   Pulse 64   Ht 6' (1.829 m)   Wt 209 lb (94.8 kg)   SpO2 98%   BMI 28.35 kg/m , BMI Body mass index is 28.35 kg/m.  Wt Readings from Last 3 Encounters:  01/25/18 209 lb (94.8 kg)  12/29/17 204 lb 6.4 oz (92.7 kg)  09/21/17 199 lb 8 oz (90.5 kg)    General: Patient appears comfortable at rest. HEENT: Conjunctiva and lids normal, oropharynx clear. Neck: Supple, no elevated JVP or carotid bruits, no thyromegaly. Lungs: Clear to auscultation, nonlabored breathing at rest. Cardiac: Regular rate and rhythm, no S3 or significant systolic murmur. Abdomen: Soft, nontender, bowel sounds present. Extremities: No pitting edema, distal pulses 2+. Skin: Warm and dry. Musculoskeletal: No kyphosis. Neuropsychiatric: Alert and oriented x3, affect grossly appropriate.  ECG: I personally reviewed the tracing from 04/27/2017 which showed atrial fibrillation with ventricular paced rhythm.  Recent Labwork: 12/21/2017: ALT 31; AST 31; BUN 21; Creatinine, Ser 1.17; Hemoglobin 11.9; Platelets 94; Potassium 6.2; Sodium 135   Other Studies Reviewed Today:  Lexiscan Cardiolite 03/26/2014: FINDINGS: Stress/ECG data: The patient was stressed according to the Lexi scan protocol. The heart rate remain in the 60 beats per min range. The blood pressure averaged 120/60. No chest pain was reported.  ECG demonstrated a paced ventricular rhythm with underlying atrial fibrillation. There were no changes with stress.  Perfusion: No perfusion defects to suggest ischemia or infarction.  Wall Motion: Normal regional wall motion. No left ventricular dilation.  Left  Ventricular Ejection Fraction: 64 %  End diastolic volume 503 ml  End systolic volume 45 ml  IMPRESSION: 1. No evidence of myocardial ischemia or scar.  2. Normal regional wall motion.  3. Left ventricular ejection fraction 64%  4. Low-risk stress test findings*.  Assessment and Plan:  1.  Permanent atrial fibrillation.  He is on Coumadin for stroke prophylaxis with follow-up per Dr. Manuella Ghazi.  2.  History of AV node ablation with St. Jude pacemaker in place.  He is following with Dr. Rayann Heman.  3.  Nonobstructive CAD, no active angina symptoms.  He is on statin therapy.  No aspirin with concurrent use of Coumadin.  4.  Mixed hyperlipidemia on Zocor.  Continues to  follow with Dr. Manuella Ghazi.  Current medicines were reviewed with the patient today.  Disposition: Follow-up in 1 year, sooner if needed.  Signed, Satira Sark, MD, [facility] 01/25/2018 2:43 PM    [facility] [place], [place], [place] [address] Phone: [phone]; Fax: [phone]

## 2018-01-25 ENCOUNTER — Ambulatory Visit: Payer: Medicare Other | Admitting: Cardiology

## 2018-01-25 ENCOUNTER — Encounter: Payer: Self-pay | Admitting: Cardiology

## 2018-01-25 VITALS — BP 132/68 | HR 64 | Ht 72.0 in | Wt 209.0 lb

## 2018-01-25 DIAGNOSIS — I442 Atrioventricular block, complete: Secondary | ICD-10-CM

## 2018-01-25 DIAGNOSIS — E782 Mixed hyperlipidemia: Secondary | ICD-10-CM

## 2018-01-25 DIAGNOSIS — I4821 Permanent atrial fibrillation: Secondary | ICD-10-CM | POA: Diagnosis not present

## 2018-01-25 DIAGNOSIS — Z950 Presence of cardiac pacemaker: Secondary | ICD-10-CM | POA: Diagnosis not present

## 2018-01-25 DIAGNOSIS — I2510 Atherosclerotic heart disease of native coronary artery without angina pectoris: Secondary | ICD-10-CM

## 2018-01-25 NOTE — Patient Instructions (Addendum)

## 2018-03-11 ENCOUNTER — Observation Stay (HOSPITAL_COMMUNITY)
Admission: EM | Admit: 2018-03-11 | Discharge: 2018-03-12 | Disposition: A | Discharge status: Home / Self Care | Payer: Medicare Other | Attending: Family Medicine | Admitting: Family Medicine

## 2018-03-11 ENCOUNTER — Encounter (HOSPITAL_COMMUNITY): Payer: Self-pay | Admitting: Emergency Medicine

## 2018-03-11 ENCOUNTER — Other Ambulatory Visit: Payer: Self-pay

## 2018-03-11 ENCOUNTER — Emergency Department (HOSPITAL_COMMUNITY): Payer: Medicare Other

## 2018-03-11 DIAGNOSIS — Z950 Presence of cardiac pacemaker: Secondary | ICD-10-CM | POA: Insufficient documentation

## 2018-03-11 DIAGNOSIS — G9341 Metabolic encephalopathy: Secondary | ICD-10-CM | POA: Diagnosis present

## 2018-03-11 DIAGNOSIS — R4182 Altered mental status, unspecified: Secondary | ICD-10-CM | POA: Diagnosis present

## 2018-03-11 DIAGNOSIS — Z794 Long term (current) use of insulin: Secondary | ICD-10-CM | POA: Diagnosis not present

## 2018-03-11 DIAGNOSIS — E119 Type 2 diabetes mellitus without complications: Secondary | ICD-10-CM | POA: Diagnosis not present

## 2018-03-11 DIAGNOSIS — C9000 Multiple myeloma not having achieved remission: Secondary | ICD-10-CM | POA: Diagnosis present

## 2018-03-11 DIAGNOSIS — R2681 Unsteadiness on feet: Secondary | ICD-10-CM | POA: Diagnosis not present

## 2018-03-11 DIAGNOSIS — R410 Disorientation, unspecified: Secondary | ICD-10-CM

## 2018-03-11 DIAGNOSIS — N189 Chronic kidney disease, unspecified: Secondary | ICD-10-CM | POA: Diagnosis not present

## 2018-03-11 DIAGNOSIS — C9001 Multiple myeloma in remission: Secondary | ICD-10-CM | POA: Insufficient documentation

## 2018-03-11 DIAGNOSIS — G9340 Encephalopathy, unspecified: Secondary | ICD-10-CM | POA: Diagnosis not present

## 2018-03-11 DIAGNOSIS — Z79899 Other long term (current) drug therapy: Secondary | ICD-10-CM | POA: Insufficient documentation

## 2018-03-11 DIAGNOSIS — I129 Hypertensive chronic kidney disease with stage 1 through stage 4 chronic kidney disease, or unspecified chronic kidney disease: Secondary | ICD-10-CM | POA: Insufficient documentation

## 2018-03-11 DIAGNOSIS — I4821 Permanent atrial fibrillation: Secondary | ICD-10-CM | POA: Diagnosis not present

## 2018-03-11 DIAGNOSIS — I2510 Atherosclerotic heart disease of native coronary artery without angina pectoris: Secondary | ICD-10-CM | POA: Diagnosis not present

## 2018-03-11 DIAGNOSIS — D472 Monoclonal gammopathy: Secondary | ICD-10-CM | POA: Diagnosis present

## 2018-03-11 DIAGNOSIS — F419 Anxiety disorder, unspecified: Secondary | ICD-10-CM | POA: Diagnosis present

## 2018-03-11 DIAGNOSIS — N289 Disorder of kidney and ureter, unspecified: Secondary | ICD-10-CM

## 2018-03-11 LAB — PROTIME-INR
INR: 2.96
Prothrombin Time: 30.4 seconds — ABNORMAL HIGH (ref 11.4–15.2)

## 2018-03-11 LAB — URINALYSIS, COMPLETE (UACMP) WITH MICROSCOPIC
Bacteria, UA: NONE SEEN
Bilirubin Urine: NEGATIVE
Glucose, UA: NEGATIVE mg/dL
Hgb urine dipstick: NEGATIVE
KETONES UR: NEGATIVE mg/dL
Leukocytes, UA: NEGATIVE
Nitrite: NEGATIVE
Protein, ur: NEGATIVE mg/dL
Specific Gravity, Urine: 1.019 (ref 1.005–1.030)
pH: 5 (ref 5.0–8.0)

## 2018-03-11 LAB — CBG MONITORING, ED
Glucose-Capillary: 85 mg/dL (ref 70–99)
Glucose-Capillary: 155 mg/dL — ABNORMAL HIGH (ref 70–99)

## 2018-03-11 LAB — COMPREHENSIVE METABOLIC PANEL
ALT: 29 U/L (ref 0–44)
AST: 35 U/L (ref 15–41)
Albumin: 3.8 g/dL (ref 3.5–5.0)
Alkaline Phosphatase: 92 U/L (ref 38–126)
Anion gap: 7 (ref 5–15)
BUN: 24 mg/dL — ABNORMAL HIGH (ref 8–23)
CO2: 21 mmol/L — AB (ref 22–32)
Calcium: 8.7 mg/dL — ABNORMAL LOW (ref 8.9–10.3)
Chloride: 109 mmol/L (ref 98–111)
Creatinine, Ser: 1.25 mg/dL — ABNORMAL HIGH (ref 0.61–1.24)
GFR calc Af Amer: >60 mL/min (ref >60)
GFR calc non Af Amer: 56 mL/min — ABNORMAL LOW (ref >60)
GLUCOSE: 96 mg/dL (ref 70–99)
Potassium: 4.1 mmol/L (ref 3.5–5.1)
SODIUM: 137 mmol/L (ref 135–145)
Total Bilirubin: 1.1 mg/dL (ref 0.3–1.2)
Total Protein: 8 g/dL (ref 6.5–8.1)

## 2018-03-11 LAB — CBC
HCT: 38.8 % — ABNORMAL LOW (ref 39.0–52.0)
Hemoglobin: 12.9 g/dL — ABNORMAL LOW (ref 13.0–17.0)
MCH: 34.6 pg — ABNORMAL HIGH (ref 26.0–34.0)
MCHC: 33.2 g/dL (ref 30.0–36.0)
MCV: 104 fL — ABNORMAL HIGH (ref 80.0–100.0)
Platelets: 69 10*3/uL — ABNORMAL LOW (ref 150–400)
RBC: 3.73 MIL/uL — ABNORMAL LOW (ref 4.22–5.81)
RDW: 12.7 % (ref 11.5–15.5)
WBC: 5.6 10*3/uL (ref 4.0–10.5)
nRBC: 0 % (ref 0.0–0.2)

## 2018-03-11 LAB — AMMONIA: Ammonia: 11 umol/L (ref 9–35)

## 2018-03-11 MED ORDER — WARFARIN SODIUM 5 MG PO TABS
5.0000 mg | ORAL_TABLET | ORAL | Status: AC
  Administered 2018-03-11: 5 mg via ORAL
  Filled 2018-03-11: qty 1

## 2018-03-11 MED ORDER — ONDANSETRON HCL 4 MG/2ML IJ SOLN: 4.0000 mg | Freq: Four times a day (QID) | INTRAMUSCULAR | Status: DC | PRN

## 2018-03-11 MED ORDER — SODIUM CHLORIDE 0.9 % IV SOLN
INTRAVENOUS | Status: AC
  Administered 2018-03-11: 21:00:00 via INTRAVENOUS

## 2018-03-11 MED ORDER — STROKE: EARLY STAGES OF RECOVERY BOOK
Freq: Once | Status: DC
  Filled 2018-03-11: qty 1

## 2018-03-11 MED ORDER — ACETAMINOPHEN 650 MG RE SUPP: 650.0000 mg | Freq: Four times a day (QID) | RECTAL | Status: DC | PRN

## 2018-03-11 MED ORDER — SERTRALINE HCL 50 MG PO TABS
50.0000 mg | ORAL_TABLET | Freq: Every day | ORAL | Status: DC
  Administered 2018-03-12: 50 mg via ORAL
  Filled 2018-03-11: qty 1

## 2018-03-11 MED ORDER — ISOSORBIDE MONONITRATE ER 60 MG PO TB24
120.0000 mg | ORAL_TABLET | Freq: Every day | ORAL | Status: DC
  Administered 2018-03-12: 120 mg via ORAL
  Filled 2018-03-11: qty 2

## 2018-03-11 MED ORDER — POLYETHYLENE GLYCOL 3350 17 G PO PACK: 17.0000 g | PACK | Freq: Every day | ORAL | Status: DC | PRN

## 2018-03-11 MED ORDER — SODIUM CHLORIDE 0.9% FLUSH
3.0000 mL | Freq: Two times a day (BID) | INTRAVENOUS | Status: DC
  Administered 2018-03-11: 3 mL via INTRAVENOUS

## 2018-03-11 MED ORDER — INSULIN ASPART 100 UNIT/ML ~~LOC~~ SOLN: 0.0000 [IU] | Freq: Every day | SUBCUTANEOUS | Status: DC

## 2018-03-11 MED ORDER — INSULIN DETEMIR 100 UNIT/ML ~~LOC~~ SOLN
10.0000 [IU] | Freq: Two times a day (BID) | SUBCUTANEOUS | Status: DC
  Administered 2018-03-12 (×2): 10 [IU] via SUBCUTANEOUS
  Filled 2018-03-11 (×4): qty 0.1

## 2018-03-11 MED ORDER — INSULIN ASPART 100 UNIT/ML ~~LOC~~ SOLN
0.0000 [IU] | Freq: Three times a day (TID) | SUBCUTANEOUS | Status: DC
  Administered 2018-03-12: 2 [IU] via SUBCUTANEOUS
  Administered 2018-03-12: 1 [IU] via SUBCUTANEOUS

## 2018-03-11 MED ORDER — METOPROLOL SUCCINATE ER 25 MG PO TB24
12.5000 mg | ORAL_TABLET | Freq: Every day | ORAL | Status: DC
  Administered 2018-03-12: 12.5 mg via ORAL
  Filled 2018-03-11: qty 1

## 2018-03-11 MED ORDER — SIMVASTATIN 10 MG PO TABS: 40.0000 mg | ORAL_TABLET | Freq: Every day | ORAL | Status: DC

## 2018-03-11 MED ORDER — WARFARIN - PHARMACIST DOSING INPATIENT: Freq: Every day | Status: DC

## 2018-03-11 MED ORDER — ALLOPURINOL 100 MG PO TABS
100.0000 mg | ORAL_TABLET | Freq: Every day | ORAL | Status: DC
  Administered 2018-03-12: 100 mg via ORAL
  Filled 2018-03-11: qty 1

## 2018-03-11 MED ORDER — CLONAZEPAM 0.5 MG PO TABS
0.5000 mg | ORAL_TABLET | Freq: Every day | ORAL | Status: DC
  Administered 2018-03-12: 0.5 mg via ORAL
  Filled 2018-03-11: qty 1

## 2018-03-11 MED ORDER — ONDANSETRON HCL 4 MG PO TABS: 4.0000 mg | ORAL_TABLET | Freq: Four times a day (QID) | ORAL | Status: DC | PRN

## 2018-03-11 MED ORDER — SODIUM CHLORIDE 0.9 % IV BOLUS
1000.0000 mL | Freq: Once | INTRAVENOUS | Status: AC
  Administered 2018-03-11: 1000 mL via INTRAVENOUS

## 2018-03-11 MED ORDER — ACETAMINOPHEN 325 MG PO TABS: 650.0000 mg | ORAL_TABLET | Freq: Four times a day (QID) | ORAL | Status: DC | PRN

## 2018-03-11 NOTE — ED Triage Notes (Signed)
PT WAS WORKING OUTSIDE YESTERDAY HE IS CONFUSED AND POSSIBLE DEHYDRATION

## 2018-03-11 NOTE — Progress Notes (Signed)
ANTICOAGULATION CONSULT NOTE - Follow Up Consult  Pharmacy Consult for warfarin Indication: atrial fibrillation  Allergies  Allergen Reactions  . Contrast Media [Iodinated Diagnostic Agents]   . Shellfish Allergy Itching and Rash  . Sulfonamide Derivatives Itching and Rash    Patient Measurements: Height: 6' (182.9 cm) Weight: 200 lb (90.7 kg) IBW/kg (Calculated) : 77.6  Vital Signs: Temp: 98.2 F (36.8 C) (12/29 1642) Temp Source: Oral (12/29 1642) BP: 155/75 (12/29 2000) Pulse Rate: 59 (12/29 2000)  Labs: Recent Labs    03/11/18 1745  HGB 12.9*  HCT 38.8*  PLT 69*  LABPROT 30.4*  INR 2.96  CREATININE 1.25*    Estimated Creatinine Clearance: 56.9 mL/min (A) (by C-G formula based on SCr of 1.25 mg/dL (H)).   Medications:  (Not in a hospital admission)  Scheduled:  .  stroke: mapping our early stages of recovery book   Does not apply Once  . [START ON 03/12/2018] allopurinol  100 mg Oral Daily  . [START ON 03/12/2018] clonazePAM  0.5 mg Oral Daily  . insulin aspart  0-5 Units Subcutaneous QHS  . [START ON 03/12/2018] insulin aspart  0-9 Units Subcutaneous TID WC  . insulin detemir  10 Units Subcutaneous BID  . [START ON 03/12/2018] isosorbide mononitrate  120 mg Oral Daily  . [START ON 03/12/2018] metoprolol succinate  12.5 mg Oral Daily  . [START ON 03/12/2018] sertraline  50 mg Oral Daily  . [START ON 03/12/2018] simvastatin  40 mg Oral q1800  . sodium chloride flush  3 mL Intravenous Q12H  . warfarin  5 mg Oral NOW  . [START ON 03/12/2018] Warfarin - Pharmacist Dosing Inpatient   Does not apply q1800   Infusions:  . sodium chloride     PRN: acetaminophen **OR** acetaminophen, ondansetron **OR** ondansetron (ZOFRAN) IV, polyethylene glycol Anti-infectives (From admission, onward)   None      Assessment: 73 male presents to ED with confusion, Dr Myna Hidalgo consults pharmacy for afib warfarin anticoagulation. Patient is therapeutic upon admission with INR  2.96. Patient takes warfarin 5mg  all days except Monday and Friday, those days he takes warfarin 7.5mg  orally.  Goal of Therapy:  INR 2-3 Monitor platelets by anticoagulation protocol: Yes   Plan:  Warfarin 5mg  po x 1 dose tonight INR in AM Continue to monitor for S&S of bleeding  Donna Christen Zuleima Haser 03/11/2018,9:00 PM

## 2018-03-11 NOTE — H&P (Signed)
History and Physical    LEGACY LACIVITA XTK:[id number] DOB: 30-Nov-1945 DOA: 03/11/2018  PCP: Monico Blitz, MD   Patient coming from: Home   Chief Complaint: Confusion   HPI: Jay Campbell is a 72 y.o. male with medical history significant for heart block with pacer, atrial fibrillation on warfarin, nonobstructive coronary artery disease, smoldering multiple myeloma, and insulin-dependent diabetes mellitus, now presenting to the emergency department for evaluation of confusion.  Patient was reportedly in his usual state yesterday, working outside, but was more somnolent this morning, woke up to go to church, but soon returned to bed, when his family checked on him early in the afternoon, he seemed to be confused.  There was no slurred speech or focal weakness appreciated, but the patient was grossly confused, but on 2 pairs of pants, did not recognize keys or what they were for.  There was no recent fall or trauma reported and the patient has not voiced any specific complaints, but realizes that something is wrong.  ED Course: Upon arrival to the ED, patient is found to be afebrile, saturating well on room air, and with vitals otherwise stable.  Noncontrast head CT is negative for acute intracranial abnormality and chest x-ray is negative for acute cardiopulmonary disease.  Chemistry panel is notable for creatinine 1.25, similar to priors.  CBC features a stable macrocytosis and chronic thrombocytopenia that is worsened from 94,000 to 69,000.  INR is therapeutic at 2.96 and urinalysis is unremarkable.  Ammonia level is normal.  ED physician discussed the case with neurology and further evaluation with MRI brain was recommended.  Patient was given a liter of normal saline, remains hemodynamically stable but confused, and will be observed for further evaluation and management of acute encephalopathy.  Review of Systems:  All other systems reviewed and apart from HPI, are negative.  Past Medical  History:  Diagnosis Date  . Anxiety   . Atrial fibrillation ([place])    Permanent  . Coronary artery disease    Mild nonobstructive 1/12  . DM (diabetes mellitus) ([place])   . GERD (gastroesophageal reflux disease)   . Gout   . Hearing disorder, cochlear   . Hiatal hernia   . HLD (hyperlipidemia)   . HTN (hypertension)   . S/P AV nodal ablation    s/p SJM PPM; gen change 02-01-13 by Dr Rayann Heman  . Smoldering multiple myeloma ([place]) 12/29/2017  . Warfarin anticoagulation     Past Surgical History:  Procedure Laterality Date  . CATARACT EXTRACTION Right   . COCHLEAR IMPLANT Right   . HEMORRHOID BANDING    . PACEMAKER GENERATOR CHANGE Bilateral 02/01/2013   Procedure: PACEMAKER GENERATOR CHANGE;  Surgeon: Coralyn Mark, MD;  Location: [facility];  Service: Cardiovascular;  Laterality: Bilateral;  . PACEMAKER PLACEMENT  2002; 2014   SJM implanted by Dr Lovena Le with AV nodal ablation performed; gen change to [employer] SR RF by Dr Rayann Heman 02-01-13  . RETINAL LASER PROCEDURE Left   . SLT LASER APPLICATION Left 99/04/4266   Procedure: SLT LASER APPLICATION;  Surgeon: Williams Che, MD;  Location: [facility];  Service: Ophthalmology;  Laterality: Left;     reports that he has never smoked. He has never used smokeless tobacco. He reports that he does not drink alcohol or use drugs.  Allergies  Allergen Reactions  . Contrast Media [Iodinated Diagnostic Agents]   . Shellfish Allergy Itching and Rash  . Sulfonamide Derivatives Itching and Rash    Family History  Problem  Relation Age of Onset  . COPD Mother   . Emphysema Mother   . Cancer - Lung Father   . Heart disease Paternal Uncle   . Stomach cancer Neg Hx   . Colon cancer Neg Hx      Prior to Admission medications   Medication Sig Start Date End Date Taking? Authorizing Provider  allopurinol (ZYLOPRIM) 100 MG tablet Take 100 mg by mouth Daily.  12/15/11  Yes [provider]  clonazePAM (KLONOPIN) 0.5 MG tablet Take 0.5 mg by  mouth daily.  03/11/14  Yes [provider]  fish oil-omega-3 fatty acids 1000 MG capsule Take 1 g by mouth 2 (two) times daily.     Yes [provider]  HUMULIN 70/30 (70-30) 100 UNIT/ML injection Inject 30 Units into the skin 2 (two) times daily with a meal.  05/19/14  Yes [provider]  isosorbide mononitrate (IMDUR) 60 MG 24 hr tablet Take 120 mg by mouth daily. 09/26/11  Yes de Stanford Scotland, MD  loperamide (IMODIUM) 2 MG capsule Take 1 capsule (2 mg total) by mouth 4 (four) times daily as needed for diarrhea or loose stools. 04/11/17  Yes Noemi Chapel, MD  loratadine (CLARITIN) 10 MG tablet Take 10 mg by mouth daily as needed for allergies.   Yes [provider]  metFORMIN (GLUCOPHAGE) 1000 MG tablet Take 1,000 mg by mouth 2 (two) times daily with a meal.     Yes [provider]  metoprolol succinate (TOPROL-XL) 25 MG 24 hr tablet Take 0.5 tablets by mouth Daily. 12/30/11  Yes [provider]  nitroGLYCERIN (NITROSTAT) 0.4 MG SL tablet Place 1 tablet (0.4 mg total) under the tongue every 5 (five) minutes x 3 doses as needed. 04/15/16  Yes Thompson Grayer, MD  [facility] VERIO test strip  08/17/17  Yes [provider]  sertraline (ZOLOFT) 50 MG tablet Take 50 mg by mouth daily. Take 1  tab daily   Yes [provider]  simvastatin (ZOCOR) 40 MG tablet Take 40 mg by mouth at bedtime.     Yes [provider]  warfarin (COUMADIN) 5 MG tablet Take 5-7.5 mg by mouth daily. 7.5 mg on Monday and Friday and 5 mg all other days (MANAGED BY PMD)   Yes [provider]    Physical Exam: Vitals:   03/11/18 1815 03/11/18 1830 03/11/18 1900 03/11/18 2000  BP: (!) 171/71 98/84 (!) 147/80 (!) 155/75  Pulse: 60 60 62 (!) 59  Resp: '12 14  14  ' Temp:      TempSrc:      SpO2: 100% 98% 100% 100%  Weight:      Height:         Constitutional: NAD, calm  Eyes: PERTLA, lids and conjunctivae normal ENMT: Mucous membranes are  moist. Posterior pharynx clear of any exudate or lesions.   Neck: normal, supple, no masses, no thyromegaly Respiratory: clear to auscultation bilaterally, no wheezing, no crackles. Normal respiratory effort.  Cardiovascular: Rate ~60 and irregular. No extremity edema.   Abdomen: No distension, no tenderness, no masses palpated. Bowel sounds actove.  Musculoskeletal: no clubbing / cyanosis. No joint deformity upper and lower extremities.    Skin: no significant rashes, lesions, ulcers. Warm, dry, well-perfused. Neurologic: CN 2-12 grossly intact. Sensation intact, patellar DTR normal. Strength 5/5 in all 4 limbs.  Psychiatric: Alert and oriented to person, place, and situation. Intermittent disorientation. Pleasant and cooperative.    Labs on Admission: I have personally reviewed  following labs and imaging studies  CBC: Recent Labs  Lab 03/11/18 1745  WBC 5.6  HGB 12.9*  HCT 38.8*  MCV 104.0*  PLT 69*   Basic Metabolic Panel: Recent Labs  Lab 03/11/18 1745  NA 137  K 4.1  CL 109  CO2 21*  GLUCOSE 96  BUN 24*  CREATININE 1.25*  CALCIUM 8.7*   GFR: Estimated Creatinine Clearance: 56.9 mL/min (A) (by C-G formula based on SCr of 1.25 mg/dL (H)). Liver Function Tests: Recent Labs  Lab 03/11/18 1745  AST 35  ALT 29  ALKPHOS 92  BILITOT 1.1  PROT 8.0  ALBUMIN 3.8   No results for input(s): LIPASE, AMYLASE in the last 168 hours. Recent Labs  Lab 03/11/18 1957  AMMONIA 11   Coagulation Profile: Recent Labs  Lab 03/11/18 1745  INR 2.96   Cardiac Enzymes: No results for input(s): CKTOTAL, CKMB, CKMBINDEX, TROPONINI in the last 168 hours. BNP (last 3 results) No results for input(s): PROBNP in the last 8760 hours. HbA1C: No results for input(s): HGBA1C in the last 72 hours. CBG: Recent Labs  Lab 03/11/18 1833  GLUCAP 85   Lipid Profile: No results for input(s): CHOL, HDL, LDLCALC, TRIG, CHOLHDL, LDLDIRECT in the last 72 hours. Thyroid Function Tests: No  results for input(s): TSH, T4TOTAL, FREET4, T3FREE, THYROIDAB in the last 72 hours. Anemia Panel: No results for input(s): VITAMINB12, FOLATE, FERRITIN, TIBC, IRON, RETICCTPCT in the last 72 hours. Urine analysis:    Component Value Date/Time   COLORURINE YELLOW 03/11/2018 1910   APPEARANCEUR CLEAR 03/11/2018 1910   LABSPEC 1.019 03/11/2018 1910   PHURINE 5.0 03/11/2018 1910   GLUCOSEU NEGATIVE 03/11/2018 1910   HGBUR NEGATIVE 03/11/2018 1910   BILIRUBINUR NEGATIVE 03/11/2018 1910   KETONESUR NEGATIVE 03/11/2018 1910   PROTEINUR NEGATIVE 03/11/2018 1910   NITRITE NEGATIVE 03/11/2018 1910   LEUKOCYTESUR NEGATIVE 03/11/2018 1910   Sepsis Labs: '@LABRCNTIP' (procalcitonin:4,lacticidven:4) )No results found for this or any previous visit (from the past 240 hour(s)).   Radiological Exams on Admission: Dg Chest 2 View  Result Date: 03/11/2018 CLINICAL DATA:  Dehydration and confusion. EXAM: CHEST - 2 VIEW COMPARISON:  None. FINDINGS: Normal heart size. Mild aortic atherosclerosis without aneurysm. Clear lungs. Bilateral nipple shadows project over the lung bases. Left-sided pacemaker apparatus with lead in the right ventricle is noted without complicating features. No acute osseous abnormality. Bones are demineralized along the dorsal spine. IMPRESSION: No active cardiopulmonary disease. Electronically Signed   By: Ashley Royalty M.D.   On: 03/11/2018 19:29   Ct Head Wo Contrast  Result Date: 03/11/2018 CLINICAL DATA:  Confusion and dehydration. EXAM: CT HEAD WITHOUT CONTRAST TECHNIQUE: Contiguous axial images were obtained from the base of the skull through the vertex without intravenous contrast. COMPARISON:  01/27/2015 FINDINGS: Brain: Age related involutional changes of the brain. Minimal small vessel ischemia of periventricular, deep and subcortical white matter. No large vascular territory infarction, hemorrhage, midline shift or edema. No intra-axial mass nor extra-axial fluid. Vascular: No  hyperdense vessel sign. Atherosclerosis at the skull base. Skull: Cochlear implant device projects within the right temporoparietal skull. No acute osseous abnormality. Sinuses/Orbits: Intact orbits and globes. No acute sinus abnormality. Other: Clear mastoids. IMPRESSION: Minimal small vessel ischemia. No acute intracranial abnormality. Electronically Signed   By: Ashley Royalty M.D.   On: 03/11/2018 19:28    EKG: Ordered, not yet performed.   Assessment/Plan   1. Acute encephalopathy  - Presents with one day of confusion  - No acute  findings on head CT and no focal deficits identified  - No infectious s/s; ammonia wnl in ED  - Improved in ED after a liter of NS but not back to baseline per family at bedside  - ED discussed with neurology and MRI brain was advised  - Check MRI brain if pacer compatible, check TSH, B12, folate, and RPR  - Continue neuro checks, supportive care    2. Atrial fibrillation  - Rate is well-controlled  - CHADS-VASc at least 3 (age, DM, CAD)  - Continue warfarin and metoprolol   3. CAD  - No anginal complaints  - Continue beta-blocker, statin, nitrates  4. Insulin-dependent DM  - No A1c on file  - Managed at home with metformin and Humalog 30 units BID  - Check CBG's, use Levemir 10 units BID with sliding-scale correctional while in hospital   5. Multiple myeloma  - Kidney function and Hgb stable, no hypercalcemia, no fevers or sweats, no LAD, platelets lower than priors without bleeding  - He will continue oncology follow-up   6. CKD II-III   - SCr is 1.25 on admission, similar to priors  - Renally-dose medications     DVT prophylaxis: warfarin  Code Status: Full  Family Communication: Wife and daughter updated at bedside Consults called: None  Admission status: Observation     Vianne Bulls, MD [facility] Pager [phone]  If 7PM-7AM, please contact night-coverage [email or web address] Password [facility]  03/11/2018, 8:42 PM

## 2018-03-11 NOTE — ED Notes (Signed)
Patient eating meal tray at this time. 

## 2018-03-11 NOTE — ED Provider Notes (Signed)
**Note Jay-Identified via Obfuscation** [facility] EMERGENCY DEPARTMENT Provider Note   CSN: [id number] Arrival date & time: 03/11/18  1629     History   Chief Complaint Chief Complaint  Patient presents with  . Altered Mental Status    HPI [name] is a 72 y.o. male.  He has a history of A. fib and is on Coumadin for this.  He is also diabetic.  He is brought in by his wife and daughter for evaluation of confusion.  His wife said he worked outside yesterday and she was worried he might be dehydrated.  She woke him up to go to church today and he got up but then went back to bed.  They checked on him again around 2 PM and he has been confused.  She said he was tearful and he wanted to know what was wrong.  The daughter related that when they were getting him here he put on 2 pairs of pants and could not seem to figure out what his keys were for.  Patient himself seems to recognize that something is wrong but he cannot tell me.  Said he had a headache last night and is a little bit better today.  No recent falls.  No blurry vision double vision weakness or numbness.  No chest pain or shortness of breath.  No urinary symptoms.  No Recent medication changes.  The history is provided by the patient, the spouse and a relative.  Altered Mental Status   This is a new problem. The current episode started 12 to 24 hours ago. Associated symptoms include confusion and somnolence. Pertinent negatives include no seizures, no unresponsiveness, no weakness, no agitation, no delusions, no hallucinations, no self-injury and no violence. His past medical history is significant for diabetes and heart disease.    Past Medical History:  Diagnosis Date  . Anxiety   . Atrial fibrillation ([place])    Permanent  . Coronary artery disease    Mild nonobstructive 1/12  . DM (diabetes mellitus) ([place])   . GERD (gastroesophageal reflux disease)   . Gout   . Hearing disorder, cochlear   . Hiatal hernia   . HLD (hyperlipidemia)   . HTN  (hypertension)   . S/P AV nodal ablation    s/p SJM PPM; gen change 02-01-13 by Dr Rayann Heman  . 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 Warfarin anticoagulation     Patient Active Problem List   Diagnosis Date Noted  . 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 Precordial pain 03/20/2014  . Complete heart block ([place]) 02/01/2013  . S/P AV nodal ablation   . Warfarin anticoagulation   . CORONARY ATHEROSCLEROSIS NATIVE CORONARY ARTERY 02/01/2008  . Permanent atrial fibrillation 02/01/2008  . Cardiac pacemaker in situ 02/01/2008    Past Surgical History:  Procedure Laterality Date  . CATARACT EXTRACTION Right   . COCHLEAR IMPLANT Right   . HEMORRHOID BANDING    . PACEMAKER GENERATOR CHANGE Bilateral 02/01/2013   Procedure: PACEMAKER GENERATOR CHANGE;  Surgeon: Coralyn Mark, MD;  Location: [facility] [facility];  Service: Cardiovascular;  Laterality: Bilateral;  . PACEMAKER PLACEMENT  2002; 2014   SJM implanted by Dr Lovena Le with AV nodal ablation performed; gen change to [employer] SR RF by Dr Rayann Heman 02-01-13  . RETINAL LASER PROCEDURE Left   . SLT LASER APPLICATION Left 79/10/9209   Procedure: SLT LASER APPLICATION;  Surgeon: Williams Che, MD;  Location: [facility];  Service: Ophthalmology;  Laterality: Left;  Home Medications    Prior to Admission medications   Medication Sig Start Date End Date Taking? Authorizing Provider  allopurinol (ZYLOPRIM) 100 MG tablet Take 100 mg by mouth Daily.  12/15/11   [provider]  clonazePAM (KLONOPIN) 0.5 MG tablet Take 0.5 mg by mouth daily.  03/11/14   [provider]  fish oil-omega-3 fatty acids 1000 MG capsule Take 1 g by mouth 2 (two) times daily.      [provider]  HUMULIN 70/30 (70-30) 100 UNIT/ML injection Inject 30 Units into the skin 2 (two) times daily with a meal.  05/19/14   [provider]  isosorbide mononitrate (IMDUR) 60 MG 24 hr tablet Take 120 mg by mouth daily. 09/26/11   Jay Stanford Scotland, MD  loperamide (IMODIUM) 2 MG capsule Take 1 capsule (2 mg total) by mouth 4 (four) times daily as needed for diarrhea or loose stools. 04/11/17   Noemi Chapel, MD  metFORMIN (GLUCOPHAGE) 1000 MG tablet Take 1,000 mg by mouth 2 (two) times daily with a meal.      [provider]  metoprolol succinate (TOPROL-XL) 25 MG 24 hr tablet Take 0.5 tablets by mouth Daily. 12/30/11   [provider]  nitroGLYCERIN (NITROSTAT) 0.4 MG SL tablet Place 1 tablet (0.4 mg total) under the tongue every 5 (five) minutes x 3 doses as needed. 04/15/16   Thompson Grayer, MD  [facility] VERIO test strip  08/17/17   [provider]  ranitidine (ZANTAC) 150 MG tablet Take 1 tablet by mouth 2 (two) times daily. 11/14/16   [provider]  sertraline (ZOLOFT) 50 MG tablet Take 50 mg by mouth daily. Take 1  tab daily    [provider]  simvastatin (ZOCOR) 40 MG tablet Take 40 mg by mouth at bedtime.      [provider]  warfarin (COUMADIN) 5 MG tablet Take 5-7.5 mg by mouth daily. 7.5 mg on Monday and Friday and 5 mg all other days (MANAGED BY PMD)    [provider]    Family History Family History  Problem Relation Age of Onset  . COPD Mother   . Emphysema Mother   . Cancer - Lung Father   . Heart disease Paternal Uncle   . Stomach cancer Neg Hx   . Colon cancer Neg Hx     Social History Social History   Tobacco Use  . Smoking status: Never Smoker  . Smokeless tobacco: Never Used  Substance Use Topics  . Alcohol use: No    Alcohol/week: 0.0 standard drinks  . Drug use: No     Allergies   Contrast media [iodinated diagnostic agents]; Shellfish allergy; and Sulfonamide derivatives   Review of Systems Review of Systems  Constitutional: Negative for fever.  HENT: Negative for sore throat.   Eyes: Negative for visual disturbance.  Respiratory: Negative for shortness of breath.   Cardiovascular: Negative for chest pain.  Gastrointestinal:  Negative for abdominal pain.  Genitourinary: Negative for dysuria.  Musculoskeletal: Negative for neck pain.  Skin: Negative for rash.  Neurological: Positive for headaches. Negative for seizures and weakness.  Psychiatric/Behavioral: Positive for confusion. Negative for agitation, hallucinations and self-injury.     Physical Exam Updated Vital Signs BP (!) 171/71 (BP Location: Right Arm)   Pulse 60   Temp 98.2 F (36.8 C) (Oral)   Resp 12   Ht 6' (1.829 m)   Wt 90.7 kg   SpO2 100%   BMI 27.12  kg/m   Physical Exam Vitals signs and nursing note reviewed.  Constitutional:      Appearance: He is well-developed.  HENT:     Head: Normocephalic and atraumatic.  Eyes:     Conjunctiva/sclera: Conjunctivae normal.  Neck:     Musculoskeletal: Neck supple.  Cardiovascular:     Rate and Rhythm: Normal rate and regular rhythm.     Heart sounds: No murmur.  Pulmonary:     Effort: Pulmonary effort is normal. No respiratory distress.     Breath sounds: Normal breath sounds.  Abdominal:     Palpations: Abdomen is soft.     Tenderness: There is no abdominal tenderness.  Musculoskeletal: Normal range of motion.        General: No tenderness or signs of injury.  Skin:    General: Skin is warm and dry.     Capillary Refill: Capillary refill takes less than 2 seconds.  Neurological:     General: No focal deficit present.     Mental Status: He is alert and oriented to person, place, and time.     Sensory: No sensory deficit.     Motor: No weakness.     Gait: Gait normal.     Comments: Patient knows it is Sunday, knows is at the hospital.  He is able to name pen and phone.  I do not see an obvious focal deficit on his exam.      ED Treatments / Results  Labs (all labs ordered are listed, but only abnormal results are displayed) Labs Reviewed  COMPREHENSIVE METABOLIC PANEL - Abnormal; Notable for the following components:      Result Value   CO2 21 (*)    BUN 24 (*)     Creatinine, Ser 1.25 (*)    Calcium 8.7 (*)    GFR calc non Af Amer 56 (*)    All other components within normal limits  CBC - Abnormal; Notable for the following components:   RBC 3.73 (*)    Hemoglobin 12.9 (*)    HCT 38.8 (*)    MCV 104.0 (*)    MCH 34.6 (*)    Platelets 69 (*)    All other components within normal limits  PROTIME-INR - Abnormal; Notable for the following components:   Prothrombin Time 30.4 (*)    All other components within normal limits  HEMOGLOBIN A1C - Abnormal; Notable for the following components:   Hgb A1c MFr Bld 6.1 (*)    All other components within normal limits  LIPID PANEL - Abnormal; Notable for the following components:   HDL 35 (*)    All other components within normal limits  BASIC METABOLIC PANEL - Abnormal; Notable for the following components:   CO2 21 (*)    Glucose, Bld 140 (*)    BUN 24 (*)    Creatinine, Ser 1.25 (*)    Calcium 8.1 (*)    GFR calc non Af Amer 56 (*)    All other components within normal limits  CBC - Abnormal; Notable for the following components:   WBC 3.7 (*)    RBC 3.41 (*)    Hemoglobin 12.1 (*)    HCT 35.8 (*)    MCV 105.0 (*)    MCH 35.5 (*)    Platelets 77 (*)    All other components within normal limits  PROTIME-INR - Abnormal; Notable for the following components:   Prothrombin Time 35.6 (*)    All other components within  normal limits  GLUCOSE, CAPILLARY - Abnormal; Notable for the following components:   Glucose-Capillary 157 (*)    All other components within normal limits  GLUCOSE, CAPILLARY - Abnormal; Notable for the following components:   Glucose-Capillary 123 (*)    All other components within normal limits  GLUCOSE, CAPILLARY - Abnormal; Notable for the following components:   Glucose-Capillary 179 (*)    All other components within normal limits  CBG MONITORING, ED - Abnormal; Notable for the following components:   Glucose-Capillary 155 (*)    All other components within normal limits   URINALYSIS, COMPLETE (UACMP) WITH MICROSCOPIC  AMMONIA  VITAMIN B12  TSH  FOLATE RBC  RPR  HIV ANTIBODY (ROUTINE TESTING W REFLEX)  CBG MONITORING, ED    EKG EKG Interpretation  Date/Time:  Sunday March 11 2018 16:45:51 EST Ventricular Rate:  60 PR Interval:    QRS Duration: 160 QT Interval:  464 QTC Calculation: 464 R Axis:   -76 Text Interpretation:  Ventricular-paced rhythm Abnormal ECG similar to prior Confirmed by Aletta Edouard [id number]) on 03/12/2018 1:39:43 PM   Radiology Dg Chest 2 View  Result Date: 03/11/2018 CLINICAL DATA:  Dehydration and confusion. EXAM: CHEST - 2 VIEW COMPARISON:  None. FINDINGS: Normal heart size. Mild aortic atherosclerosis without aneurysm. Clear lungs. Bilateral nipple shadows project over the lung bases. Left-sided pacemaker apparatus with lead in the right ventricle is noted without complicating features. No acute osseous abnormality. Bones are demineralized along the dorsal spine. IMPRESSION: No active cardiopulmonary disease. Electronically Signed   By: Ashley Royalty M.D.   On: 03/11/2018 19:29   Ct Head Wo Contrast  Result Date: 03/11/2018 CLINICAL DATA:  Confusion and dehydration. EXAM: CT HEAD WITHOUT CONTRAST TECHNIQUE: Contiguous axial images were obtained from the base of the skull through the vertex without intravenous contrast. COMPARISON:  01/27/2015 FINDINGS: Brain: Age related involutional changes of the brain. Minimal small vessel ischemia of periventricular, deep and subcortical white matter. No large vascular territory infarction, hemorrhage, midline shift or edema. No intra-axial mass nor extra-axial fluid. Vascular: No hyperdense vessel sign. Atherosclerosis at the skull base. Skull: Cochlear implant device projects within the right temporoparietal skull. No acute osseous abnormality. Sinuses/Orbits: Intact orbits and globes. No acute sinus abnormality. Other: Clear mastoids. IMPRESSION: Minimal small vessel ischemia. No acute  intracranial abnormality. Electronically Signed   By: Ashley Royalty M.D.   On: 03/11/2018 19:28    Procedures Procedures (including critical care time)  Medications Ordered in ED Medications - No data to display   Initial Impression / Assessment and Plan / ED Course  I have reviewed the triage vital signs and the nursing notes.  Pertinent labs & imaging results that were available during my care of the patient were reviewed by me and considered in my medical decision making (see chart for details).  Clinical Course as of Mar 12 1336  Nancy Fetter Mar 11, 2018  2011 I reviewed the case with neurology tele-neurology and he feels this is possibly a's cortical stroke and recommends that the patient be admitted for an MRI.  He recommends holding the Coumadin until after the MRI is done and then further recommendations from there.   [MB]  2024 As far as being a TPA candidate the patient is outside the window and is already on anticoagulation with an INR of 2.96.   [MB]  2044 Discussed with Dr. Myna Hidalgo from the hospitalist service who will evaluate the patient for admission.   [MB]    Clinical  Course User Index [MB] Hayden Rasmussen, MD     Final Clinical Impressions(s) / ED Diagnoses   Final diagnoses:  Confusion    ED Discharge Orders    None       Hayden Rasmussen, MD 03/12/18 1340

## 2018-03-12 ENCOUNTER — Observation Stay (HOSPITAL_COMMUNITY): Payer: Medicare Other

## 2018-03-12 DIAGNOSIS — G9340 Encephalopathy, unspecified: Secondary | ICD-10-CM | POA: Diagnosis not present

## 2018-03-12 LAB — CBC
HCT: 35.8 % — ABNORMAL LOW (ref 39.0–52.0)
Hemoglobin: 12.1 g/dL — ABNORMAL LOW (ref 13.0–17.0)
MCH: 35.5 pg — ABNORMAL HIGH (ref 26.0–34.0)
MCHC: 33.8 g/dL (ref 30.0–36.0)
MCV: 105 fL — ABNORMAL HIGH (ref 80.0–100.0)
NRBC: 0 % (ref 0.0–0.2)
Platelets: 77 10*3/uL — ABNORMAL LOW (ref 150–400)
RBC: 3.41 MIL/uL — ABNORMAL LOW (ref 4.22–5.81)
RDW: 12.9 % (ref 11.5–15.5)
WBC: 3.7 10*3/uL — AB (ref 4.0–10.5)

## 2018-03-12 LAB — HEMOGLOBIN A1C
Hgb A1c MFr Bld: 6.1 % — ABNORMAL HIGH (ref 4.8–5.6)
Mean Plasma Glucose: 128.37 mg/dL

## 2018-03-12 LAB — GLUCOSE, CAPILLARY
Glucose-Capillary: 123 mg/dL — ABNORMAL HIGH (ref 70–99)
Glucose-Capillary: 157 mg/dL — ABNORMAL HIGH (ref 70–99)
Glucose-Capillary: 179 mg/dL — ABNORMAL HIGH (ref 70–99)

## 2018-03-12 LAB — BASIC METABOLIC PANEL
ANION GAP: 5 (ref 5–15)
BUN: 24 mg/dL — ABNORMAL HIGH (ref 8–23)
CO2: 21 mmol/L — ABNORMAL LOW (ref 22–32)
Calcium: 8.1 mg/dL — ABNORMAL LOW (ref 8.9–10.3)
Chloride: 111 mmol/L (ref 98–111)
Creatinine, Ser: 1.25 mg/dL — ABNORMAL HIGH (ref 0.61–1.24)
GFR calc non Af Amer: 56 mL/min — ABNORMAL LOW (ref >60)
Glucose, Bld: 140 mg/dL — ABNORMAL HIGH (ref 70–99)
Potassium: 4.5 mmol/L (ref 3.5–5.1)
SODIUM: 137 mmol/L (ref 135–145)

## 2018-03-12 LAB — VITAMIN B12: Vitamin B-12: 180 pg/mL (ref 180–914)

## 2018-03-12 LAB — PROTIME-INR
INR: 3.63
Prothrombin Time: 35.6 seconds — ABNORMAL HIGH (ref 11.4–15.2)

## 2018-03-12 LAB — LIPID PANEL
Cholesterol: 108 mg/dL (ref 0–200)
HDL: 35 mg/dL — ABNORMAL LOW (ref >40)
LDL Cholesterol: 59 mg/dL (ref 0–99)
Total CHOL/HDL Ratio: 3.1 RATIO
Triglycerides: 71 mg/dL (ref <150)
VLDL: 14 mg/dL (ref 0–40)

## 2018-03-12 LAB — TSH: TSH: 2.731 u[IU]/mL (ref 0.350–4.500)

## 2018-03-12 MED ORDER — ORAL CARE MOUTH RINSE: 15.0000 mL | Freq: Two times a day (BID) | OROMUCOSAL | Status: DC

## 2018-03-12 MED ORDER — CHLORHEXIDINE GLUCONATE 0.12 % MT SOLN
15.0000 mL | Freq: Two times a day (BID) | OROMUCOSAL | Status: DC
  Administered 2018-03-12: 15 mL via OROMUCOSAL
  Filled 2018-03-12: qty 15

## 2018-03-12 NOTE — Care Management Obs Status (Signed)
[facility] NOTIFICATION   Patient Details  Name: Jay Campbell MRN: [id number] Date of Birth: [birth year]   Medicare Observation Status Notification Given:  Yes    Shelda Altes 03/12/2018, 10:50 AM

## 2018-03-12 NOTE — Discharge Summary (Signed)
Physician Discharge Summary  Jay Campbell HCW:[id number] DOB: [birth year] DOA: 03/11/2018  PCP: Monico Blitz, MD  Admit date: 03/11/2018 Discharge date: 03/12/2018  Time spent: 35 minutes  Recommendations for Outpatient Follow-up:  1. Follow-up PCP x1 week  Discharge Diagnoses:  Principal Problem:   Acute encephalopathy Active Problems:   CORONARY ATHEROSCLEROSIS NATIVE CORONARY ARTERY   Permanent atrial fibrillation   Smoldering multiple myeloma ([facility])   Mild renal insufficiency   Anxiety   Insulin-requiring or dependent type II diabetes mellitus ([place])   Discharge Condition: Stable and resolved  Diet recommendation: Cardiac  Filed Weights   03/11/18 1642 03/11/18 2246  Weight: 90.7 kg 92.1 kg    Hospital Course:  72 year old male who presented with confusion was found to be he dehydrated was given IV fluids and he quickly resolved.  MRI was attempted to be obtained however he cannot get this as his pacemaker is not MRI compatible.  Patient did have a CT of his head which was normal.  He had no focal neurologic deficits while he was here.  Neurological checks were all normal while he was here.  Patient be discharged in stable and improved condition.  He does not drink hardly any fluids at all daily.  He has been instructed to increase his water intake.  He is also been instructed to keep a blood pressure log and bring to his primary care physician's office follow-up appointment in 1 week to adjust his blood pressure medications if needed.  At his PCP office would repeat a BMP in approximately 1 week.   Discharge Exam: Vitals:   03/12/18 0715 03/12/18 0852  BP: (!) 131/93 131/89  Pulse: 60 (!) 59  Resp: 16 18  Temp: 97.6 F (36.4 C) 98.2 F (36.8 C)  SpO2: 99% 99%    General: Alert and oriented no apparent distress Cardiovascular: Regular rate and rhythm without murmurs rubs or gallops Respiratory: Clear to auscultation bilateral no wheezes rhonchi  rales  Discharge Instructions   Discharge Instructions    Diet - low sodium heart healthy   Complete by:  As directed    Increase activity slowly   Complete by:  As directed      Allergies as of 03/12/2018      Reactions   Contrast Media [iodinated Diagnostic Agents]    Shellfish Allergy Itching, Rash   Sulfonamide Derivatives Itching, Rash      Medication List    TAKE these medications   allopurinol 100 MG tablet Commonly known as:  ZYLOPRIM Take 100 mg by mouth Daily.   clonazePAM 0.5 MG tablet Commonly known as:  KLONOPIN Take 0.5 mg by mouth daily.   fish oil-omega-3 fatty acids 1000 MG capsule Take 1 g by mouth 2 (two) times daily.   HUMULIN 70/30 (70-30) 100 UNIT/ML injection Generic drug:  insulin NPH-regular Human Inject 30 Units into the skin 2 (two) times daily with a meal.   isosorbide mononitrate 60 MG 24 hr tablet Commonly known as:  IMDUR Take 120 mg by mouth daily.   loperamide 2 MG capsule Commonly known as:  IMODIUM Take 1 capsule (2 mg total) by mouth 4 (four) times daily as needed for diarrhea or loose stools.   loratadine 10 MG tablet Commonly known as:  CLARITIN Take 10 mg by mouth daily as needed for allergies.   metFORMIN 1000 MG tablet Commonly known as:  GLUCOPHAGE Take 1,000 mg by mouth 2 (two) times daily with a meal.   metoprolol succinate 25 MG  24 hr tablet Commonly known as:  TOPROL-XL Take 0.5 tablets by mouth Daily.   nitroGLYCERIN 0.4 MG SL tablet Commonly known as:  NITROSTAT Place 1 tablet (0.4 mg total) under the tongue every 5 (five) minutes x 3 doses as needed.   ONETOUCH VERIO test strip Generic drug:  glucose blood   sertraline 50 MG tablet Commonly known as:  ZOLOFT Take 50 mg by mouth daily. Take 1  tab daily   simvastatin 40 MG tablet Commonly known as:  ZOCOR Take 40 mg by mouth at bedtime.   warfarin 5 MG tablet Commonly known as:  COUMADIN Take 5-7.5 mg by mouth daily. 7.5 mg on Monday and Friday  and 5 mg all other days (MANAGED BY PMD)      Allergies  Allergen Reactions  . Contrast Media [Iodinated Diagnostic Agents]   . Shellfish Allergy Itching and Rash  . Sulfonamide Derivatives Itching and Rash   Follow-up Information    Monico Blitz, MD Follow up in 1 week(s).   Specialty:  Internal Medicine Contact information: [place] [address] [phone]        Satira Sark, MD .   Specialty:  Cardiology Contact information: [place]  [address] [phone]            The results of significant diagnostics from this hospitalization (including imaging, microbiology, ancillary and laboratory) are listed below for reference.    Significant Diagnostic Studies: Dg Chest 2 View  Result Date: 03/11/2018 CLINICAL DATA:  Dehydration and confusion. EXAM: CHEST - 2 VIEW COMPARISON:  None. FINDINGS: Normal heart size. Mild aortic atherosclerosis without aneurysm. Clear lungs. Bilateral nipple shadows project over the lung bases. Left-sided pacemaker apparatus with lead in the right ventricle is noted without complicating features. No acute osseous abnormality. Bones are demineralized along the dorsal spine. IMPRESSION: No active cardiopulmonary disease. Electronically Signed   By: Ashley Royalty M.D.   On: 03/11/2018 19:29   Ct Head Wo Contrast  Result Date: 03/11/2018 CLINICAL DATA:  Confusion and dehydration. EXAM: CT HEAD WITHOUT CONTRAST TECHNIQUE: Contiguous axial images were obtained from the base of the skull through the vertex without intravenous contrast. COMPARISON:  01/27/2015 FINDINGS: Brain: Age related involutional changes of the brain. Minimal small vessel ischemia of periventricular, deep and subcortical white matter. No large vascular territory infarction, hemorrhage, midline shift or edema. No intra-axial mass nor extra-axial fluid. Vascular: No hyperdense vessel sign. Atherosclerosis at the skull base. Skull: Cochlear implant  device projects within the right temporoparietal skull. No acute osseous abnormality. Sinuses/Orbits: Intact orbits and globes. No acute sinus abnormality. Other: Clear mastoids. IMPRESSION: Minimal small vessel ischemia. No acute intracranial abnormality. Electronically Signed   By: Ashley Royalty M.D.   On: 03/11/2018 19:28    Microbiology: No results found for this or any previous visit (from the past 240 hour(s)).   Labs: Basic Metabolic Panel: Recent Labs  Lab 03/11/18 1745 03/12/18 0418  NA 137 137  K 4.1 4.5  CL 109 111  CO2 21* 21*  GLUCOSE 96 140*  BUN 24* 24*  CREATININE 1.25* 1.25*  CALCIUM 8.7* 8.1*   Liver Function Tests: Recent Labs  Lab 03/11/18 1745  AST 35  ALT 29  ALKPHOS 92  BILITOT 1.1  PROT 8.0  ALBUMIN 3.8   No results for input(s): LIPASE, AMYLASE in the last 168 hours. Recent Labs  Lab 03/11/18 1957  AMMONIA 11   CBC: Recent Labs  Lab  03/11/18 1745 03/12/18 0418  WBC 5.6 3.7*  HGB 12.9* 12.1*  HCT 38.8* 35.8*  MCV 104.0* 105.0*  PLT 69* 77*   Cardiac Enzymes: No results for input(s): CKTOTAL, CKMB, CKMBINDEX, TROPONINI in the last 168 hours. BNP: BNP (last 3 results) No results for input(s): BNP in the last 8760 hours.  ProBNP (last 3 results) No results for input(s): PROBNP in the last 8760 hours.  CBG: Recent Labs  Lab 03/11/18 1833 03/11/18 2202 03/12/18 0032 03/12/18 0740 03/12/18 1117  GLUCAP 85 155* 157* 123* 179*       Signed:  Nealy Karapetian A MD.  [facility] 03/12/2018, 11:19 AM

## 2018-03-12 NOTE — Progress Notes (Signed)
Placed on CPAP large mask , room air , CPAP setting is 4

## 2018-03-12 NOTE — Progress Notes (Signed)
SATURATION QUALIFICATIONS: (This note is used to comply with regulatory documentation for home oxygen)  Patient Saturations on Room Air at Rest = 99%  Patient Saturations on Room Air while Ambulating = 98%  Please briefly explain why patient needs home oxygen: patient does not require supplemental oxygen while ambulating, patient maintains o2 sats well above 90% while ambulating.

## 2018-03-12 NOTE — Progress Notes (Signed)
ANTICOAGULATION CONSULT NOTE - Follow Up Consult  Pharmacy Consult for warfarin Indication: atrial fibrillation  Allergies  Allergen Reactions  . Contrast Media [Iodinated Diagnostic Agents]   . Shellfish Allergy Itching and Rash  . Sulfonamide Derivatives Itching and Rash    Patient Measurements: Height: 6' (182.9 cm) Weight: 203 lb 0.7 oz (92.1 kg) IBW/kg (Calculated) : 77.6  Vital Signs: Temp: 98.3 F (36.8 C) (12/30 1123) Temp Source: Oral (12/30 1123) BP: 152/97 (12/30 1123) Pulse Rate: 60 (12/30 1123)  Labs: Recent Labs    03/11/18 1745 03/12/18 0418  HGB 12.9* 12.1*  HCT 38.8* 35.8*  PLT 69* 77*  LABPROT 30.4* 35.6*  INR 2.96 3.63  CREATININE 1.25* 1.25*    Estimated Creatinine Clearance: 56.9 mL/min (A) (by C-G formula based on SCr of 1.25 mg/dL (H)).   Medications:  Medications Prior to Admission  Medication Sig Dispense Refill Last Dose  . allopurinol (ZYLOPRIM) 100 MG tablet Take 100 mg by mouth Daily.    03/10/2018 at Unknown time  . clonazePAM (KLONOPIN) 0.5 MG tablet Take 0.5 mg by mouth daily.    03/10/2018 at Unknown time  . fish oil-omega-3 fatty acids 1000 MG capsule Take 1 g by mouth 2 (two) times daily.     03/10/2018 at Unknown time  . HUMULIN 70/30 (70-30) 100 UNIT/ML injection Inject 30 Units into the skin 2 (two) times daily with a meal.    03/10/2018 at Unknown time  . isosorbide mononitrate (IMDUR) 60 MG 24 hr tablet Take 120 mg by mouth daily.   03/10/2018 at Unknown time  . loperamide (IMODIUM) 2 MG capsule Take 1 capsule (2 mg total) by mouth 4 (four) times daily as needed for diarrhea or loose stools. 12 capsule 0 Past Week at Unknown time  . loratadine (CLARITIN) 10 MG tablet Take 10 mg by mouth daily as needed for allergies.   unknown  . metFORMIN (GLUCOPHAGE) 1000 MG tablet Take 1,000 mg by mouth 2 (two) times daily with a meal.     03/10/2018 at Unknown time  . metoprolol succinate (TOPROL-XL) 25 MG 24 hr tablet Take 0.5 tablets by  mouth Daily.   03/10/2018 at 12A,  . nitroGLYCERIN (NITROSTAT) 0.4 MG SL tablet Place 1 tablet (0.4 mg total) under the tongue every 5 (five) minutes x 3 doses as needed. 25 tablet 3 not in past 30 days  . ONETOUCH VERIO test strip    03/10/2018 at Unknown time  . sertraline (ZOLOFT) 50 MG tablet Take 50 mg by mouth daily. Take 1  tab daily   03/10/2018 at Unknown time  . simvastatin (ZOCOR) 40 MG tablet Take 40 mg by mouth at bedtime.     03/10/2018 at Unknown time  . warfarin (COUMADIN) 5 MG tablet Take 5-7.5 mg by mouth daily. 7.5 mg on Monday and Friday and 5 mg all other days (MANAGED BY PMD)   03/10/2018 at 12AM   Scheduled:  .  stroke: mapping our early stages of recovery book   Does not apply Once  . allopurinol  100 mg Oral Daily  . chlorhexidine  15 mL Mouth Rinse BID  . clonazePAM  0.5 mg Oral Daily  . insulin aspart  0-5 Units Subcutaneous QHS  . insulin aspart  0-9 Units Subcutaneous TID WC  . insulin detemir  10 Units Subcutaneous BID  . isosorbide mononitrate  120 mg Oral Daily  . mouth rinse  15 mL Mouth Rinse q12n4p  . metoprolol succinate  12.5 mg Oral  Daily  . sertraline  50 mg Oral Daily  . simvastatin  40 mg Oral q1800  . sodium chloride flush  3 mL Intravenous Q12H  . Warfarin - Pharmacist Dosing Inpatient   Does not apply q1800   Infusions:   PRN: acetaminophen **OR** acetaminophen, ondansetron **OR** ondansetron (ZOFRAN) IV, polyethylene glycol Anti-infectives (From admission, onward)   None      Assessment: 79 male presents to ED with confusion, Dr Myna Hidalgo consults pharmacy for afib warfarin anticoagulation. Patient is therapeutic upon admission with INR 2.96. Patient takes warfarin 5mg  all days except Monday and Friday, those days he takes warfarin 7.5mg  orally. INR elevated this AM.  Goal of Therapy:  INR 2-3 Monitor platelets by anticoagulation protocol: Yes   Plan:  No coumadin today PT-INR daily Continue to monitor for S/S of bleeding  Isac Sarna, BS Vena Austria, BCPS Clinical Pharmacist Pager [phone] 03/12/2018,12:03 PM

## 2018-03-12 NOTE — Evaluation (Signed)
Physical Therapy Evaluation Patient Details Name: Jay Campbell MRN: [id number] DOB: 1945/10/22 Today's Date: 03/12/2018   History of Present Illness  Jay Campbell is a 72 y.o. male with medical history significant for heart block with pacer, atrial fibrillation on warfarin, nonobstructive coronary artery disease, smoldering multiple myeloma, and insulin-dependent diabetes mellitus, now presenting to the emergency department for evaluation of confusion.  Patient was reportedly in his usual state yesterday, working outside, but was more somnolent this morning, woke up to go to church, but soon returned to bed, when his family checked on him early in the afternoon, he seemed to be confused.  There was no slurred speech or focal weakness appreciated, but the patient was grossly confused, but on 2 pairs of pants, did not recognize keys or what they were for.  There was no recent fall or trauma reported and the patient has not voiced any specific complaints, but realizes that something is wrong.    Clinical Impression  Patient functioning at baseline for functional mobility and gait other than slight limp on RLE due to flare up of gout pain on dorsal surface right foot, able to ambulate safely on level surfaces, up/down ramps without loss of balance.  Plan:  Patient discharged from physical therapy to care of nursing for ambulation daily as tolerated for length of stay.     Follow Up Recommendations No PT follow up    Equipment Recommendations  None recommended by PT    Recommendations for Other Services       Precautions / Restrictions Precautions Precautions: None Restrictions Weight Bearing Restrictions: No      Mobility  Bed Mobility Overal bed mobility: Independent                Transfers Overall transfer level: Independent                  Ambulation/Gait Ambulation/Gait assistance: Modified independent (Device/Increase time) Gait Distance (Feet): 150  Feet Assistive device: None Gait Pattern/deviations: Decreased step length - left;Decreased stance time - left;Decreased stride length;Antalgic Gait velocity: decreased   General Gait Details: grossly WFL except slightl antalgic gait on RLE due to gout pain dorsal surface of right foot, no loss of balance on level, inclined or declined surfaces  Stairs            Wheelchair Mobility    Modified Rankin (Stroke Patients Only)       Balance Overall balance assessment: No apparent balance deficits (not formally assessed)                                           Pertinent Vitals/Pain Pain Assessment: 0-10 Pain Score: 8  Pain Location: dorsal surface of right foot secondary to gout Pain Descriptors / Indicators: Sore;Discomfort Pain Intervention(s): Limited activity within patient's tolerance;Monitored during session    Home Living Family/patient expects to be discharged to:: Private residence Living Arrangements: Spouse/significant other Available Help at Discharge: Family Type of Home: House Home Access: Ramped entrance     Home Layout: One level Home Equipment: Wheelchair - [employer] - 2 wheels;Cane - quad;Cane - single point;Bedside commode      Prior Function Level of Independence: Independent         Comments: household and short distanced community ambulator, drives     Hand Dominance        Extremity/Trunk Assessment  Upper Extremity Assessment Upper Extremity Assessment: Overall WFL for tasks assessed    Lower Extremity Assessment Lower Extremity Assessment: Overall WFL for tasks assessed    Cervical / Trunk Assessment Cervical / Trunk Assessment: Normal  Communication   Communication: No difficulties  Cognition Arousal/Alertness: Awake/alert Behavior During Therapy: WFL for tasks assessed/performed Overall Cognitive Status: Within Functional Limits for tasks assessed                                         General Comments      Exercises     Assessment/Plan    PT Assessment Patent does not need any further PT services  PT Problem List         PT Treatment Interventions      PT Goals (Current goals can be found in the Care Plan section)  Acute Rehab PT Goals Patient Stated Goal: return home with family to assist PT Goal Formulation: With patient/family Time For Goal Achievement: 03/12/18 Potential to Achieve Goals: Good    Frequency     Barriers to discharge        Co-evaluation               AM-PAC PT "6 Clicks" Mobility  Outcome Measure Help needed turning from your back to your side while in a flat bed without using bedrails?: None Help needed moving from lying on your back to sitting on the side of a flat bed without using bedrails?: None Help needed moving to and from a bed to a chair (including a wheelchair)?: None Help needed standing up from a chair using your arms (e.g., wheelchair or bedside chair)?: None Help needed to walk in hospital room?: None Help needed climbing 3-5 steps with a railing? : None 6 Click Score: 24    End of Session Equipment Utilized During Treatment: Gait belt Activity Tolerance: Patient tolerated treatment well;Patient limited by pain Patient left: in bed;with call bell/phone within reach;with family/visitor present(seated at bedside) Nurse Communication: Mobility status PT Visit Diagnosis: Unsteadiness on feet (R26.81);Other abnormalities of gait and mobility (R26.89);Muscle weakness (generalized) (M62.81)    Time: 5198-2429 PT Time Calculation (min) (ACUTE ONLY): 18 min   Charges:   PT Evaluation $PT Eval Moderate Complexity: 1 Mod PT Treatments $Gait Training: 8-22 mins        11:56 AM, 03/12/18 Lonell Grandchild, MPT Physical Therapist with [facility] [phone] office [phone] mobile phone

## 2018-03-13 LAB — RPR: RPR Ser Ql: NONREACTIVE

## 2018-03-13 LAB — FOLATE RBC
Folate, Hemolysate: 381.6 ng/mL
Folate, RBC: 1143 ng/mL (ref >498)
HEMATOCRIT: 33.4 % — AB (ref 37.5–51.0)

## 2018-03-16 LAB — HIV ANTIBODY (ROUTINE TESTING W REFLEX): HIV Screen 4th Generation wRfx: REACTIVE — AB

## 2018-03-16 LAB — RNA QUALITATIVE: HIV 1 RNA Qualitative: 1

## 2018-03-16 LAB — HIV 1/2 AB DIFFERENTIATION
HIV 1 Ab: NEGATIVE
HIV 2 Ab: NEGATIVE
Note: NEGATIVE

## 2018-03-18 LAB — CUP PACEART REMOTE DEVICE CHECK
Battery Remaining Longevity: 140 mo
Battery Remaining Percentage: 95.5 %
Battery Voltage: 3.01 V
Brady Statistic RV Percent Paced: 99 %
Date Time Interrogation Session: 20191104084854
Implantable Lead Implant Date: 20020215
Implantable Lead Location: 753860
Implantable Pulse Generator Implant Date: 20141121
Lead Channel Impedance Value: 610 Ohm
Lead Channel Pacing Threshold Amplitude: 1 V
Lead Channel Pacing Threshold Pulse Width: 0.4 ms
Lead Channel Setting Pacing Amplitude: 1.25 V
Lead Channel Setting Pacing Pulse Width: 0.4 ms
Lead Channel Setting Sensing Sensitivity: 6 mV
MDC IDC MSMT LEADCHNL RV SENSING INTR AMPL: 12 mV
Pulse Gen Model: 1240
Pulse Gen Serial Number: 7488472

## 2018-03-30 ENCOUNTER — Inpatient Hospital Stay (HOSPITAL_COMMUNITY): Payer: Medicare Other | Attending: Hematology

## 2018-03-30 DIAGNOSIS — D472 Monoclonal gammopathy: Secondary | ICD-10-CM | POA: Insufficient documentation

## 2018-03-30 LAB — CBC WITH DIFFERENTIAL/PLATELET
ABS IMMATURE GRANULOCYTES: 0.01 10*3/uL (ref 0.00–0.07)
Basophils Absolute: 0 10*3/uL (ref 0.0–0.1)
Basophils Relative: 0 %
Eosinophils Absolute: 0.1 10*3/uL (ref 0.0–0.5)
Eosinophils Relative: 2 %
HCT: 36.4 % — ABNORMAL LOW (ref 39.0–52.0)
Hemoglobin: 12.4 g/dL — ABNORMAL LOW (ref 13.0–17.0)
Immature Granulocytes: 0 %
Lymphocytes Relative: 32 %
Lymphs Abs: 1.5 10*3/uL (ref 0.7–4.0)
MCH: 35 pg — ABNORMAL HIGH (ref 26.0–34.0)
MCHC: 34.1 g/dL (ref 30.0–36.0)
MCV: 102.8 fL — ABNORMAL HIGH (ref 80.0–100.0)
MONO ABS: 0.5 10*3/uL (ref 0.1–1.0)
Monocytes Relative: 10 %
Neutro Abs: 2.7 10*3/uL (ref 1.7–7.7)
Neutrophils Relative %: 56 %
PLATELETS: 103 10*3/uL — AB (ref 150–400)
RBC: 3.54 MIL/uL — ABNORMAL LOW (ref 4.22–5.81)
RDW: 12.7 % (ref 11.5–15.5)
WBC: 4.8 10*3/uL (ref 4.0–10.5)
nRBC: 0 % (ref 0.0–0.2)

## 2018-03-30 LAB — COMPREHENSIVE METABOLIC PANEL
ALT: 30 U/L (ref 0–44)
AST: 38 U/L (ref 15–41)
Albumin: 4 g/dL (ref 3.5–5.0)
Alkaline Phosphatase: 84 U/L (ref 38–126)
Anion gap: <5 — ABNORMAL LOW (ref 5–15)
BUN: 21 mg/dL (ref 8–23)
CO2: 26 mmol/L (ref 22–32)
Calcium: 9 mg/dL (ref 8.9–10.3)
Chloride: 105 mmol/L (ref 98–111)
Creatinine, Ser: 1.05 mg/dL (ref 0.61–1.24)
GFR calc Af Amer: >60 mL/min (ref >60)
GFR calc non Af Amer: >60 mL/min (ref >60)
Glucose, Bld: 179 mg/dL — ABNORMAL HIGH (ref 70–99)
Potassium: 5.3 mmol/L — ABNORMAL HIGH (ref 3.5–5.1)
Sodium: 133 mmol/L — ABNORMAL LOW (ref 135–145)
Total Bilirubin: 1.3 mg/dL — ABNORMAL HIGH (ref 0.3–1.2)
Total Protein: 8 g/dL (ref 6.5–8.1)

## 2018-03-30 LAB — LACTATE DEHYDROGENASE: LDH: 151 U/L (ref 98–192)

## 2018-03-31 LAB — IGG, IGA, IGM
IgA: 165 mg/dL (ref 61–437)
IgG (Immunoglobin G), Serum: 2315 mg/dL — ABNORMAL HIGH (ref 700–1600)
IgM (Immunoglobulin M), Srm: 149 mg/dL — ABNORMAL HIGH (ref 15–143)

## 2018-04-01 LAB — BETA 2 MICROGLOBULIN, SERUM: Beta-2 Microglobulin: 2 mg/L (ref 0.6–2.4)

## 2018-04-02 LAB — KAPPA/LAMBDA LIGHT CHAINS
Kappa free light chain: 53.2 mg/L — ABNORMAL HIGH (ref 3.3–19.4)
Kappa, lambda light chain ratio: 2.6 — ABNORMAL HIGH (ref 0.26–1.65)
Lambda free light chains: 20.5 mg/L (ref 5.7–26.3)

## 2018-04-04 NOTE — Progress Notes (Signed)
Cardiology Office Note    Date:  04/05/2018   ID:  Dorrian, Doggett [birth year], MRN [id number]  PCP:  Monico Blitz, MD  Cardiologist: Rozann Lesches, MD    Chief Complaint  Patient presents with  . Hospitalization Follow-up    History of Present Illness:    Jay Campbell is a 73 y.o. male with past medical history of permanent atrial fibrillation (s/p AV node ablation with St. Jude PPM placement), nonobstructive CAD by prior catheterization and low-risk NST in 03/2014, HTN, HLD, IDDM, Stage 2-3 CKD and Multiple Myeloma who presents to the office today for hospital follow-up.  He was last examined by Dr. Domenic Polite in 01/2018 and he denied any recent chest pain or dyspnea on exertion at that time.  He was being treated for Multiple Myeloma by Oncology.   In the interim, he was admitted to [facility] on 03/11/2018 for evaluation of worsening confusion. He had been working outside and family was concerned that he might of been dehydrated. Head CT was performed and showed minimal small vessel ischemia with no acute intracranial abnormalities.  Routine neurological checks were performed and showed no deficits. It was thought that his episode might have been secondary to dehydration and increased fluid intake was recommended at the time of discharge.  In talking with the patient today, he reports being under increased stress over the past few weeks as his wife recently underwent surgery for sarcoma at [facility]. He reports she is overall progressing well.  He denies any recurrent episodes of confusion since hospital discharge. Says he has increased his fluid intake on a daily basis and is consuming more water along with a daily protein shake. He denies any recent chest pain, dyspnea on exertion, orthopnea, PND, or lower extremity edema.  He does not check his blood pressure regularly at home but it is well controlled at 136/68 during today's visit.  Past Medical History:  Diagnosis Date  .  Anxiety   . Atrial fibrillation ([place])    Permanent  . Coronary artery disease    Mild nonobstructive 1/12  . DM (diabetes mellitus) ([place])   . GERD (gastroesophageal reflux disease)   . Gout   . Hearing disorder, cochlear   . Hiatal hernia   . HLD (hyperlipidemia)   . HTN (hypertension)   . S/P AV nodal ablation    s/p SJM PPM; gen change 02-01-13 by Dr Rayann Heman  . Smoldering multiple myeloma ([place]) 12/29/2017  . Warfarin anticoagulation     Past Surgical History:  Procedure Laterality Date  . CATARACT EXTRACTION Right   . COCHLEAR IMPLANT Right   . HEMORRHOID BANDING    . PACEMAKER GENERATOR CHANGE Bilateral 02/01/2013   Procedure: PACEMAKER GENERATOR CHANGE;  Surgeon: Coralyn Mark, MD;  Location: [facility]ATH [facility];  Service: Cardiovascular;  Laterality: Bilateral;  . PACEMAKER PLACEMENT  2002; 2014   SJM implanted by Dr Lovena Le with AV nodal ablation performed; gen change to [employer] SR RF by Dr Rayann Heman 02-01-13  . RETINAL LASER PROCEDURE Left   . SLT LASER APPLICATION Left 03/16/1436   Procedure: SLT LASER APPLICATION;  Surgeon: Williams Che, MD;  Location: [facility];  Service: Ophthalmology;  Laterality: Left;    Current Medications: Outpatient Medications Prior to Visit  Medication Sig Dispense Refill  . allopurinol (ZYLOPRIM) 100 MG tablet Take 100 mg by mouth Daily.     . clonazePAM (KLONOPIN) 0.5 MG tablet Take 0.5 mg by mouth daily.     Marland Kitchen  fish oil-omega-3 fatty acids 1000 MG capsule Take 1 g by mouth 2 (two) times daily.      Marland Kitchen HUMULIN 70/30 (70-30) 100 UNIT/ML injection Inject 30 Units into the skin 2 (two) times daily with a meal.     . isosorbide mononitrate (IMDUR) 60 MG 24 hr tablet Take 120 mg by mouth daily.    Marland Kitchen loperamide (IMODIUM) 2 MG capsule Take 1 capsule (2 mg total) by mouth 4 (four) times daily as needed for diarrhea or loose stools. 12 capsule 0  . metFORMIN (GLUCOPHAGE) 1000 MG tablet Take 1,000 mg by mouth 2 (two) times daily with a meal.      . metoprolol  succinate (TOPROL-XL) 25 MG 24 hr tablet Take 0.5 tablets by mouth Daily.    . nitroGLYCERIN (NITROSTAT) 0.4 MG SL tablet Place 1 tablet (0.4 mg total) under the tongue every 5 (five) minutes x 3 doses as needed. 25 tablet 3  . ONETOUCH VERIO test strip     . sertraline (ZOLOFT) 50 MG tablet Take 50 mg by mouth daily. Take 1  tab daily    . simvastatin (ZOCOR) 40 MG tablet Take 40 mg by mouth at bedtime.      Marland Kitchen warfarin (COUMADIN) 5 MG tablet Take 5-7.5 mg by mouth daily. 7.5 mg on Monday and Friday and 5 mg all other days (MANAGED BY PMD)    . loratadine (CLARITIN) 10 MG tablet Take 10 mg by mouth daily as needed for allergies.     No facility-administered medications prior to visit.      Allergies:   Contrast media [iodinated diagnostic agents]; Shellfish allergy; and Sulfonamide derivatives   Social History   Socioeconomic History  . Marital status: Married    Spouse name: Not on file  . Number of children: Not on file  . Years of education: Not on file  . Highest education level: Not on file  Occupational History  . Not on file  Social Needs  . Financial resource strain: Not on file  . Food insecurity:    Worry: Not on file    Inability: Not on file  . Transportation needs:    Medical: Not on file    Non-medical: Not on file  Tobacco Use  . Smoking status: Never Smoker  . Smokeless tobacco: Never Used  Substance and Sexual Activity  . Alcohol use: No    Alcohol/week: 0.0 standard drinks  . Drug use: No  . Sexual activity: Not on file  Lifestyle  . Physical activity:    Days per week: Not on file    Minutes per session: Not on file  . Stress: Not on file  Relationships  . Social connections:    Talks on phone: Not on file    Gets together: Not on file    Attends religious service: Not on file    Active member of club or organization: Not on file    Attends meetings of clubs or organizations: Not on file    Relationship status: Not on file  Other Topics Concern    . Not on file  Social History Narrative  . Not on file     Family History:  The patient's family history includes COPD in his mother; Cancer - Lung in his father; Emphysema in his mother; Heart disease in his paternal uncle.   Review of Systems:   Please see the history of present illness.     General:  No chills, fever, night sweats or  weight changes.  Cardiovascular:  No chest pain, dyspnea on exertion, edema, orthopnea, palpitations, paroxysmal nocturnal dyspnea. Dermatological: No rash, lesions/masses Respiratory: No cough, dyspnea Urologic: No hematuria, dysuria Abdominal:   No nausea, vomiting, diarrhea, bright red blood per rectum, melena, or hematemesis Neurologic:  No visual changes. Positive for confusion (now resolved).   All other systems reviewed and are otherwise negative except as noted above.   Physical Exam:    VS:  BP 136/68   Pulse 62   Ht 6' (1.829 m)   Wt 206 lb (93.4 kg)   SpO2 98%   BMI 27.94 kg/m    General: Well developed, well nourished Caucasian male appearing in no acute distress. Head: Normocephalic, atraumatic, sclera non-icteric, no xanthomas, nares are without discharge.  Neck: No carotid bruits. JVD not elevated.  Lungs: Respirations regular and unlabored, without wheezes or rales.  Heart: Irregularly irregular. No S3 or S4.  No murmur, no rubs, or gallops appreciated. Abdomen: Soft, non-tender, non-distended with normoactive bowel sounds. No hepatomegaly. No rebound/guarding. No obvious abdominal masses. Msk:  Strength and tone appear normal for age. No joint deformities or effusions. Extremities: No clubbing or cyanosis. No lower edema.  Distal pedal pulses are 2+ bilaterally. Neuro: Alert and oriented X 3. Moves all extremities spontaneously. No focal deficits noted. Psych:  Responds to questions appropriately with a normal affect. Skin: No rashes or lesions noted  Wt Readings from Last 3 Encounters:  04/05/18 206 lb (93.4 kg)   03/11/18 203 lb 0.7 oz (92.1 kg)  01/25/18 209 lb (94.8 kg)     Studies/Labs Reviewed:   EKG:  EKG is not ordered today.    Recent Labs: 03/12/2018: TSH 2.731 03/30/2018: ALT 30; BUN 21; Creatinine, Ser 1.05; Hemoglobin 12.4; Platelets 103; Potassium 5.3; Sodium 133   Lipid Panel    Component Value Date/Time   CHOL 108 03/12/2018 0417   TRIG 71 03/12/2018 0417   HDL 35 (L) 03/12/2018 0417   CHOLHDL 3.1 03/12/2018 0417   VLDL 14 03/12/2018 0417   LDLCALC 59 03/12/2018 0417    Additional studies/ records that were reviewed today include:   NST: 03/2014 IMPRESSION: 1.  No evidence of myocardial ischemia or scar.  2. Normal regional wall motion.  3. Left ventricular ejection fraction 64%  4.  Low-risk stress test findings*.   Assessment:    1. Permanent atrial fibrillation   2. Cardiac pacemaker in situ   3. Essential hypertension   4. Hyperlipidemia LDL goal <70   5. Hyperkalemia      Plan:   In order of problems listed above:  1. Permanent Atrial Fibrillation - He denies any recent palpitations and heart rate is well controlled in the 60's during today's visit. Remains on Toprol-XL 12.5 mg daily. - Denies any evidence of active bleeding. On Coumadin for anticoagulation and INR is followed by his PCP.  2. Cardiac PPM - he is s/p AV Node ablation with St. Jude PPM placement. Followed by Dr. Rayann Heman with interrogation earlier this month showing normal device function. He has upcoming follow-up scheduled with Dr. Rayann Heman on 04/13/2018.  3. HTN - BP is well controlled at 136/68 during today's visit. He remains on Imdur 120 mg daily and Toprol-XL 12.5 mg daily. I encouraged him to follow BP in the ambulatory setting given the recent discontinuation of Ramipril. If he requires additional medical therapy in the future, would consider Amlodipine.  4. HLD - FLP in 02/2018 showed total cholesterol 108, triglycerides 71, HDL 35,  and LDL 59. He remains on Simvastatin  40 mg daily.  5. Hyperkalemia - K+ remained elevated at 5.3 on most recent check despite stopping ACE-I. He does consume 1-2 bananas on a daily basis and I recommended he reduce his potassium intake. Being followed by Oncology as well.  Medication Adjustments/Labs and Tests Ordered: Current medicines are reviewed at length with the patient today.  Concerns regarding medicines are outlined above.  Medication changes, Labs and Tests ordered today are listed in the Patient Instructions below. Patient Instructions  Medication Instructions:  Your physician recommends that you continue on your current medications as directed. Please refer to the Current Medication list given to you today.  If you need a refill on your cardiac medications before your next appointment, please call your pharmacy.   Lab work: NONE  If you have labs (blood work) drawn today and your tests are completely normal, you will receive your results only by: Marland Kitchen MyChart Message (if you have MyChart) OR . A paper copy in the mail If you have any lab test that is abnormal or we need to change your treatment, we will call you to review the results.  Testing/Procedures: NONE   Follow-Up: At [facility], you and your health needs are our priority.  As part of our continuing mission to provide you with exceptional heart care, we have created designated Provider Care Teams.  These Care Teams include your primary Cardiologist (physician) and Advanced Practice Providers (APPs -  Physician Assistants and Nurse Practitioners) who all work together to provide you with the care you need, when you need it. You will need a follow up appointment as planned .  Please call our office 2 months in advance to schedule this appointment.  You may see Rozann Lesches, MD or one of the following Advanced Practice Providers on your designated Care Team:   Bernerd Pho, PA-C [facility]) . Ermalinda Barrios, PA-C ([place])  Any  Other Special Instructions Will Be Listed Below (If Applicable). Thank you for choosing [facility]!     Signed, Erma Heritage, PA-C  04/05/2018 4:59 PM    [facility] [address] [place], [place] [address] Phone: [phone] Fax: [phone]

## 2018-04-05 ENCOUNTER — Ambulatory Visit: Payer: Medicare Other | Admitting: Student

## 2018-04-05 ENCOUNTER — Encounter: Payer: Self-pay | Admitting: Student

## 2018-04-05 VITALS — BP 136/68 | HR 62 | Ht 72.0 in | Wt 206.0 lb

## 2018-04-05 DIAGNOSIS — I4821 Permanent atrial fibrillation: Secondary | ICD-10-CM

## 2018-04-05 DIAGNOSIS — I10 Essential (primary) hypertension: Secondary | ICD-10-CM | POA: Diagnosis not present

## 2018-04-05 DIAGNOSIS — E785 Hyperlipidemia, unspecified: Secondary | ICD-10-CM | POA: Diagnosis not present

## 2018-04-05 DIAGNOSIS — Z950 Presence of cardiac pacemaker: Secondary | ICD-10-CM | POA: Diagnosis not present

## 2018-04-05 DIAGNOSIS — E875 Hyperkalemia: Secondary | ICD-10-CM

## 2018-04-05 NOTE — Patient Instructions (Addendum)
Medication Instructions:  Your physician recommends that you continue on your current medications as directed. Please refer to the Current Medication list given to you today.  If you need a refill on your cardiac medications before your next appointment, please call your pharmacy.   Lab work: NONE  If you have labs (blood work) drawn today and your tests are completely normal, you will receive your results only by: Marland Kitchen MyChart Message (if you have MyChart) OR . A paper copy in the mail If you have any lab test that is abnormal or we need to change your treatment, we will call you to review the results.  Testing/Procedures: NONE   Follow-Up: At [facility], you and your health needs are our priority.  As part of our continuing mission to provide you with exceptional heart care, we have created designated Provider Care Teams.  These Care Teams include your primary Cardiologist (physician) and Advanced Practice Providers (APPs -  Physician Assistants and Nurse Practitioners) who all work together to provide you with the care you need, when you need it. You will need a follow up appointment as planned .  Please call our office 2 months in advance to schedule this appointment.  You may see Rozann Lesches, MD or one of the following Advanced Practice Providers on your designated Care Team:   Bernerd Pho, PA-C [facility]) . Ermalinda Barrios, PA-C ([place])  Any Other Special Instructions Will Be Listed Below (If Applicable). Thank you for choosing [facility]!     Potassium Content of Foods  Potassium is a mineral found in many foods and drinks. It affects how the heart works, and helps keep fluids and minerals balanced in the body. The amount of potassium you need each day depends on your age and any medical conditions you may have. Talk to your health care provider or dietitian about how much potassium you need. The following lists of foods provide the  general serving size for foods and the approximate amount of potassium in each serving, listed in milligrams (mg). Actual values may vary depending on the product and how it is processed. High in potassium The following foods and beverages have 200 mg or more of potassium per serving:  Apricots (raw) - 2 have 200 mg of potassium.  Apricots (dry) - 5 have 200 mg of potassium.  Artichoke - 1 medium has 345 mg of potassium.  Avocado -  fruit has 245 mg of potassium.  Banana - 1 medium fruit has 425 mg of potassium.  [place] or baked beans (canned) -  cup has 280 mg of potassium.  White beans (canned) -  cup has 595 mg potassium.  Beef roast - 3 oz has 320 mg of potassium.  Ground beef - 3 oz has 270 mg of potassium.  Beets (raw or cooked) -  cup has 260 mg of potassium.  Bran muffin - 2 oz has 300 mg of potassium.  Broccoli (cooked) -  cup has 230 mg of potassium.  Brussels sprouts -  cup has 250 mg of potassium.  Cantaloupe -  cup has 215 mg of potassium.  Cereal, 100% bran -  cup has 200-400 mg of potassium.  Cheeseburger -1 single fast food burger has 225-400 mg of potassium.  Chicken - 3 oz has 220 mg of potassium.  Clams (canned) - 3 oz has 535 mg of potassium.  Crab - 3 oz has 225 mg of potassium.  Dates - 5 have 270 mg of  potassium.  Dried beans and peas -  cup has 300-475 mg of potassium.  Figs (dried) - 2 have 260 mg of potassium.  Fish (halibut, tuna, cod, snapper) - 3 oz has 480 mg of potassium.  Fish (salmon, haddock, swordfish, perch) - 3 oz has 300 mg of potassium.  Fish (tuna, canned) - 3 oz has 200 mg of potassium.  [place] fries (fast food) - 3 oz has 470 mg of potassium.  Granola with fruit and nuts -  cup has 200 mg of potassium.  Grapefruit juice -  cup has 200 mg of potassium.  Honeydew melon -  cup has 200 mg of potassium.  Kale (raw) - 1 cup has 300 mg of potassium.  Kiwi - 1 medium fruit has 240 mg of potassium.  Kohlrabi,  rutabaga, parsnips -  cup has 280 mg of potassium.  Lentils -  cup has 365 mg of potassium.  Mango - 1 each has 325 mg of potassium.  Milk (nonfat, low-fat, whole, buttermilk) - 1 cup has 350-380 mg of potassium.  Milk (chocolate) - 1 cup has 420 mg of potassium  Molasses - 1 Tbsp has 295 mg of potassium.  Mushrooms -  cup has 280 mg of potassium.  Nectarine - 1 each has 275 mg of potassium.  Nuts (almonds, peanuts, hazelnuts, [place], cashew, mixed) - 1 oz has 200 mg of potassium.  Nuts (pistachios) - 1 oz has 295 mg of potassium.  Orange - 1 fruit has 240 mg of potassium.  Orange juice -  cup has 235 mg of potassium.  Papaya -  medium fruit has 390 mg of potassium.  Peanut butter (chunky) - 2 Tbsp has 240 mg of potassium.  Peanut butter (smooth) - 2 Tbsp has 210 mg of potassium.  Pear - 1 medium (200 mg) of potassium.  Pomegranate - 1 whole fruit has 400 mg of potassium.  Pomegranate juice -  cup has 215 mg of potassium.  Pork - 3 oz has 350 mg of potassium.  Potato chips (salted) - 1 oz has 465 mg of potassium.  Potato (baked with skin) - 1 medium has 925 mg of potassium.  Potato (boiled) -  cup has 255 mg of potassium.  Potato (Mashed) -  cup has 330 mg of potassium.  Prune juice -  cup has 370 mg of potassium.  Prunes - 5 have 305 mg of potassium.  Pudding (chocolate) -  cup has 230 mg of potassium.  Pumpkin (canned) -  cup has 250 mg of potassium.  Raisins (seedless) -  cup has 270 mg of potassium.  Seeds (sunflower or pumpkin) - 1 oz has 240 mg of potassium.  Soy milk - 1 cup has 300 mg of potassium.  Spinach (cooked) - 1/2 cup has 420 mg of potassium.  Spinach (canned) -  cup has 370 mg of potassium.  Sweet potato (baked with skin) - 1 medium has 450 mg of potassium.  Swiss chard -  cup has 480 mg of potassium.  Tomato or vegetable juice -  cup has 275 mg of potassium.  Tomato (sauce or puree) -  cup has 400-550 mg of  potassium.  Tomato (raw) - 1 medium has 290 mg of potassium.  Tomato (canned) -  cup has 200-300 mg of potassium.  [place] - 3 oz has 250 mg of potassium.  Wheat germ - 1 oz has 250 mg of potassium.  Winter squash -  cup has 250 mg of potassium.  Yogurt (plain or fruited) - 6 oz has 260-435 mg of potassium.  Zucchini -  cup has 220 mg of potassium. Moderate in potassium The following foods and beverages have 50-200 mg of potassium per serving:  Apple - 1 fruit has 150 mg of potassium  Apple juice -  cup has 150 mg of potassium  Applesauce -  cup has 90 mg of potassium  Apricot nectar -  cup has 140 mg of potassium  Asparagus (small spears) -  cup has 155 mg of potassium  Asparagus (large spears) - 6 have 155 mg of potassium  Bagel (cinnamon raisin) - 1 four-inch bagel has 130 mg of potassium  Bagel (egg or plain) - 1 four- inch bagel has 70 mg of potassium  Beans (green) -  cup has 90 mg of potassium  Beans (yellow) -  cup has 190 mg of potassium  Beer, regular - 12 oz has 100 mg of potassium  Beets (canned) -  cup has 125 mg of potassium  Blackberries -  cup has 115 mg of potassium  Blueberries -  cup has 60 mg of potassium  Bread (whole wheat) - 1 slice has 70 mg of potassium  Broccoli (raw) -  cup has 145 mg of potassium  Cabbage -  cup has 150 mg of potassium  Carrots (cooked or raw) -  cup has 180 mg of potassium  Cauliflower (raw) -  cup has 150 mg of potassium  Celery (raw) -  cup has 155 mg of potassium  Cereal, bran flakes -  cup has 120-150 mg of potassium  Cheese (cottage) -  cup has 110 mg of potassium  Cherries - 10 have 150 mg of potassium  Chocolate - 1 oz bar has 165 mg of potassium  Coffee (brewed) - 6 oz has 90 mg of potassium  Corn -  cup or 1 ear has 195 mg of potassium  Cucumbers -  cup has 80 mg of potassium  Egg - 1 large egg has 60 mg of potassium  Eggplant -  cup has 60 mg of potassium  Endive  (raw) -  cup has 80 mg of potassium  English muffin - 1 has 65 mg of potassium  Fish (ocean perch) - 3 oz has 192 mg of potassium  Frankfurter, beef or pork - 1 has 75 mg of potassium  Fruit cocktail -  cup has 115 mg of potassium  Grape juice -  cup has 170 mg of potassium  Grapefruit -  fruit has 175 mg of potassium  Grapes -  cup has 155 mg of potassium  Greens: kale, turnip, collard -  cup has 110-150 mg of potassium  Ice cream or frozen yogurt (chocolate) -  cup has 175 mg of potassium  Ice cream or frozen yogurt (vanilla) -  cup has 120-150 mg of potassium  Lemons, limes - 1 each has 80 mg of potassium  Lettuce - 1 cup has 100 mg of potassium  Mixed vegetables -  cup has 150 mg of potassium  Mushrooms, raw -  cup has 110 mg of potassium  Nuts (walnuts, pecans, or macadamia) - 1 oz has 125 mg of potassium  Oatmeal -  cup has 80 mg of potassium  Okra -  cup has 110 mg of potassium  Onions -  cup has 120 mg of potassium  Peach - 1 has 185 mg of potassium  Peaches (canned) -  cup has 120 mg of potassium  Pears (  canned) -  cup has 120 mg of potassium  Peas, green (frozen) -  cup has 90 mg of potassium  Peppers (Green) -  cup has 130 mg of potassium  Peppers (Red) -  cup has 160 mg of potassium  Pineapple juice -  cup has 165 mg of potassium  Pineapple (fresh or canned) -  cup has 100 mg of potassium  Plums - 1 has 105 mg of potassium  Pudding, vanilla -  cup has 150 mg of potassium  Raspberries -  cup has 90 mg of potassium  Rhubarb -  cup has 115 mg of potassium  Rice, wild -  cup has 80 mg of potassium  Shrimp - 3 oz has 155 mg of potassium  Spinach (raw) - 1 cup has 170 mg of potassium  Strawberries -  cup has 125 mg of potassium  Summer squash -  cup has 175-200 mg of potassium  Swiss chard (raw) - 1 cup has 135 mg of potassium  Tangerines - 1 fruit has 140 mg of potassium  Tea, brewed - 6 oz has 65 mg of  potassium  Turnips -  cup has 140 mg of potassium  Watermelon -  cup has 85 mg of potassium  Wine (Red, table) - 5 oz has 180 mg of potassium  Wine (White, table) - 5 oz 100 mg of potassium Low in potassium The following foods and beverages have less than 50 mg of potassium per serving.  Bread (white) - 1 slice has 30 mg of potassium  Carbonated beverages - 12 oz has less than 5 mg of potassium  Cheese - 1 oz has 20-30 mg of potassium  Cranberries -  cup has 45 mg of potassium  Cranberry juice cocktail -  cup has 20 mg of potassium  Fats and oils - 1 Tbsp has less than 5 mg of potassium  Hummus - 1 Tbsp has 32 mg of potassium  Nectar (papaya, mango, or pear) -  cup has 35 mg of potassium  Rice (white or brown) -  cup has 50 mg of potassium  Spaghetti or macaroni (cooked) -  cup has 30 mg of potassium  T[place]illa, flour or corn - 1 has 50 mg of potassium  Waffle - 1 four-inch waffle has 50 mg of potassium  Water chestnuts -  cup has 40 mg of potassium Summary  Potassium is a mineral found in many foods and drinks. It affects how the heart works, and helps keep fluids and minerals balanced in the body.  The amount of potassium you need each day depends on your age and any existing medical conditions you may have. Your health care provider or dietitian may recommend an amount of potassium that you should have each day. This information is not intended to replace advice given to you by your health care provider. Make sure you discuss any questions you have with your health care provider. Document Released: 10/12/2004 Document Revised: 05/25/2016 Document Reviewed: 05/25/2016 Elsevier Interactive Patient Education  2019 [employer].

## 2018-04-06 ENCOUNTER — Ambulatory Visit (HOSPITAL_COMMUNITY): Payer: Medicare Other | Admitting: Internal Medicine

## 2018-04-09 ENCOUNTER — Other Ambulatory Visit: Payer: Self-pay

## 2018-04-09 ENCOUNTER — Encounter (HOSPITAL_COMMUNITY): Payer: Self-pay | Admitting: Internal Medicine

## 2018-04-09 ENCOUNTER — Inpatient Hospital Stay (HOSPITAL_COMMUNITY): Payer: Medicare Other | Attending: Hematology | Admitting: Internal Medicine

## 2018-04-09 VITALS — BP 160/65 | HR 82 | Temp 98.2°F | Resp 16 | Wt 206.5 lb

## 2018-04-09 DIAGNOSIS — D696 Thrombocytopenia, unspecified: Secondary | ICD-10-CM | POA: Diagnosis not present

## 2018-04-09 DIAGNOSIS — I2510 Atherosclerotic heart disease of native coronary artery without angina pectoris: Secondary | ICD-10-CM | POA: Insufficient documentation

## 2018-04-09 DIAGNOSIS — Z950 Presence of cardiac pacemaker: Secondary | ICD-10-CM | POA: Diagnosis not present

## 2018-04-09 DIAGNOSIS — D472 Monoclonal gammopathy: Secondary | ICD-10-CM

## 2018-04-09 DIAGNOSIS — I10 Essential (primary) hypertension: Secondary | ICD-10-CM | POA: Diagnosis not present

## 2018-04-09 DIAGNOSIS — Z79899 Other long term (current) drug therapy: Secondary | ICD-10-CM | POA: Diagnosis not present

## 2018-04-09 DIAGNOSIS — C9000 Multiple myeloma not having achieved remission: Secondary | ICD-10-CM | POA: Diagnosis present

## 2018-04-09 DIAGNOSIS — Z794 Long term (current) use of insulin: Secondary | ICD-10-CM | POA: Diagnosis not present

## 2018-04-09 DIAGNOSIS — Z7901 Long term (current) use of anticoagulants: Secondary | ICD-10-CM | POA: Insufficient documentation

## 2018-04-09 DIAGNOSIS — D7589 Other specified diseases of blood and blood-forming organs: Secondary | ICD-10-CM | POA: Diagnosis not present

## 2018-04-09 DIAGNOSIS — E875 Hyperkalemia: Secondary | ICD-10-CM | POA: Diagnosis not present

## 2018-04-09 NOTE — Progress Notes (Signed)
Diagnosis Smoldering multiple myeloma ([place]) - Plan: CBC with Differential/Platelet, Comprehensive metabolic panel, Lactate dehydrogenase, Protein electrophoresis, serum, IgG, IgA, IgM, Kappa/lambda light chains, DG Bone Survey Met  Staging Cancer Staging No matching staging information was found for the patient.  Assessment and Plan:  1.  IgG kappa smoldering myeloma, S/P bone marrow aspiration and biopsy on 09/05/2017 showing 12% plasma cells, macrocytic RBC, and mild thrombocytopenia in the setting of normal renal function, normal calcium, normal hemoglobin, and normal skeletal survey.  There is a 10% risk of progression to active multiple myeloma per year.    Bone marrow biopsy done 09/05/2017 showed  Bone Marrow, Aspirate,Biopsy, and Clot, `right iliac - NORMOCELLULAR BONE MARROW FOR AGE WITH TRILINEAGE HEMATOPOIESIS. - PLASMA CELL NEOPLASM (PLASMA CELLS 12%). - SEE COMMENT. PERIPHERAL BLOOD: - RED BLOOD CELLS WITH MACROCYTOSIS. - MILD THROMBOCYTOPENIA. Diagnosis Note The bone marrow is normocellular for age with trilineage hematopoiesis. Significant myeloid dyspoiesis or increase in blastic cells is not identified. In this background, the plasma cells are increased in number representing 12% of all cells with lack of large aggregates or sheets. Immunohistochemical stains highlight the plasma cell component in the bone marrow which shows kappa light chain restriction consistent with plasma cell neoplasm. Correlation with cytogenetic and FISH studies is recommended.  FISH and cytogenetics were negative.    Pt had 12% Plasma cells which places him in the smoldering myeloma category.  Skeletal survey done 08/17/2017 was negative for bone lesions.  Risk of progression to myeloma is 10% per year.   Labs done 03/30/2018 reviewed and showed WBC 4.8 HB 12.4 plts 103,000.  Chemistries WNL with Calcium of 9, Cr 1 and normal LFTs.  IGG stable at 2315 FLC ratio 2.6.  He will have repeat Skeletal  survey in 08/2018 and will follow-up with labs at that time.  Currently he remains on observation.    2.  Macrocytosis.  Bone marrow biopsy done 09/05/2017 shows no evidence of MDS.  He has normal cytogenetics.  Previously, B12 and folate levels WNL.    3.  Thrombocytopenia.  Review of chart shows pt had low platelets dating back to 2016 with a platelet count of 102,000.  Bone marrow biopsy done 09/05/2017 was negative for MDS.  He had labs done 09/05/2017 that showed plt count was WNL at 149,000.  Labs done 03/30/2018 showed platelet count of 103,000.  Will repeat labs in 09/2018.    4.  Hypertension.  Blood pressure is 160/65.  Follow-up with PCP.  5.  Coronary artery disease and pacemaker.  Patient is on warfarin.  Continue INR monitoring with cardiology or PCP.    6.  Hyperkalemia.  K+ 5.3.  Follow-up with PCP for ongoing monitoring as likely related to antihypertensive therapy.    Interval history:  Historical data obtained from the note dated 08/17/2017.  73 year old male who was referred by Dr. Chalmers Cater for anemia.  Review of chart shows patient had labs done April 11, 2017 that showed white count 7.1 hemoglobin 14.9 platelets 149,000.  His MCV was 105.  Chemistries were within normal limits other than a potassium of 6 his creatinine was 1.60, liver function tests were normal calcium was normal at 9.7.  There was reported history of monoclonal protein.  Patient denies any blood in his stool or his urine. He reportedly had a colonoscopy done in 2017 that showed no polyps.  He denies any history of blood transfusion.  Wife reported she has concerns about him being on metformin due to  potential kidney effects with that medication.    Current Status:  Pt is seen today for follow-up to go over labs.  He is accompanied by family.     Problem List Patient Active Problem List   Diagnosis Date Noted  . Acute encephalopathy [G93.40] 03/11/2018  . Mild renal insufficiency [N28.9] 03/11/2018  . Anxiety  [F41.9] 03/11/2018  . Insulin-requiring or dependent type II diabetes mellitus ([place]) [E11.9, Z79.4] 03/11/2018  . Smoldering multiple myeloma ([place]) [C90.00] 12/29/2017  . Precordial pain [R07.2] 03/20/2014  . Complete heart block ([place]) [I44.2] 02/01/2013  . S/P AV nodal ablation [Z98.890]   . Warfarin anticoagulation [Z79.01]   . CORONARY ATHEROSCLEROSIS NATIVE CORONARY ARTERY [I25.10] 02/01/2008  . Permanent atrial fibrillation [I48.21] 02/01/2008  . Cardiac pacemaker in situ [Z95.0] 02/01/2008    Past Medical History Past Medical History:  Diagnosis Date  . Anxiety   . Atrial fibrillation ([place])    Permanent  . Coronary artery disease    Mild nonobstructive 1/12  . DM (diabetes mellitus) ([place])   . GERD (gastroesophageal reflux disease)   . Gout   . Hearing disorder, cochlear   . Hiatal hernia   . HLD (hyperlipidemia)   . HTN (hypertension)   . S/P AV nodal ablation    s/p SJM PPM; gen change 02-01-13 by Dr Rayann Heman  . Smoldering multiple myeloma ([place]) 12/29/2017  . Warfarin anticoagulation     Past Surgical History Past Surgical History:  Procedure Laterality Date  . CATARACT EXTRACTION Right   . COCHLEAR IMPLANT Right   . HEMORRHOID BANDING    . PACEMAKER GENERATOR CHANGE Bilateral 02/01/2013   Procedure: PACEMAKER GENERATOR CHANGE;  Surgeon: Coralyn Mark, MD;  Location: [facility];  Service: Cardiovascular;  Laterality: Bilateral;  . PACEMAKER PLACEMENT  2002; 2014   SJM implanted by Dr Lovena Le with AV nodal ablation performed; gen change to Accent SR RF by Dr Rayann Heman 02-01-13  . RETINAL LASER PROCEDURE Left   . SLT LASER APPLICATION Left 54/06/9199   Procedure: SLT LASER APPLICATION;  Surgeon: Williams Che, MD;  Location: [facility];  Service: Ophthalmology;  Laterality: Left;    Family History Family History  Problem Relation Age of Onset  . COPD Mother   . Emphysema Mother   . Cancer - Lung Father   . Heart disease Paternal Uncle   . Stomach cancer Neg Hx    . Colon cancer Neg Hx      Social History  reports that he has never smoked. He has never used smokeless tobacco. He reports that he does not drink alcohol or use drugs.  Medications  Current Outpatient Medications:  .  allopurinol (ZYLOPRIM) 100 MG tablet, Take 100 mg by mouth Daily. , Disp: , Rfl:  .  clonazePAM (KLONOPIN) 0.5 MG tablet, Take 0.5 mg by mouth daily. , Disp: , Rfl:  .  colchicine 0.6 MG tablet, Take 0.6 mg by mouth daily., Disp: , Rfl:  .  fish oil-omega-3 fatty acids 1000 MG capsule, Take 1 g by mouth 2 (two) times daily.  , Disp: , Rfl:  .  HUMULIN 70/30 (70-30) 100 UNIT/ML injection, Inject 30 Units into the skin 2 (two) times daily with a meal. , Disp: , Rfl:  .  isosorbide mononitrate (IMDUR) 60 MG 24 hr tablet, Take 120 mg by mouth daily., Disp: , Rfl:  .  metFORMIN (GLUCOPHAGE) 1000 MG tablet, Take 1,000 mg by mouth 2 (two) times daily with a meal.  , Disp: ,  Rfl:  .  metoprolol succinate (TOPROL-XL) 25 MG 24 hr tablet, Take 0.5 tablets by mouth Daily., Disp: , Rfl:  .  ONETOUCH VERIO test strip, , Disp: , Rfl:  .  sertraline (ZOLOFT) 50 MG tablet, Take 50 mg by mouth daily. Take 1  tab daily, Disp: , Rfl:  .  simvastatin (ZOCOR) 40 MG tablet, Take 40 mg by mouth at bedtime.  , Disp: , Rfl:  .  warfarin (COUMADIN) 5 MG tablet, Take 5-7.5 mg by mouth daily. 7.5 mg on Monday and Friday and 5 mg all other days (MANAGED BY PMD), Disp: , Rfl:  .  loperamide (IMODIUM) 2 MG capsule, Take 1 capsule (2 mg total) by mouth 4 (four) times daily as needed for diarrhea or loose stools. (Patient not taking: Reported on 04/09/2018), Disp: 12 capsule, Rfl: 0 .  nitroGLYCERIN (NITROSTAT) 0.4 MG SL tablet, Place 1 tablet (0.4 mg total) under the tongue every 5 (five) minutes x 3 doses as needed. (Patient not taking: Reported on 04/09/2018), Disp: 25 tablet, Rfl: 3  Allergies Contrast media [iodinated diagnostic agents]; Shellfish allergy; and Sulfonamide derivatives  Review of  Systems Review of Systems - Oncology ROS negative   Physical Exam  Vitals Wt Readings from Last 3 Encounters:  04/09/18 206 lb 8 oz (93.7 kg)  04/05/18 206 lb (93.4 kg)  03/11/18 203 lb 0.7 oz (92.1 kg)   Temp Readings from Last 3 Encounters:  04/09/18 98.2 F (36.8 C) (Oral)  03/12/18 98.3 F (36.8 C) (Oral)  12/29/17 97.8 F (36.6 C) (Oral)   BP Readings from Last 3 Encounters:  04/09/18 (!) 160/65  04/05/18 136/68  03/12/18 (!) 152/97   Pulse Readings from Last 3 Encounters:  04/09/18 82  04/05/18 62  03/12/18 60   Constitutional: Well-developed, well-nourished, and in no distress.   HENT: Head: Normocephalic and atraumatic.  Mouth/Throat: No oropharyngeal exudate. Mucosa moist. Eyes: Pupils are equal, round, and reactive to light. Conjunctivae are normal. No scleral icterus.  Neck: Normal range of motion. Neck supple. No JVD present.  Cardiovascular: Normal rate, regular rhythm and normal heart sounds.  Exam reveals no gallop and no friction rub.   No murmur heard. Pulmonary/Chest: Effort normal and breath sounds normal. No respiratory distress. No wheezes.No rales.  Abdominal: Soft. Bowel sounds are normal. No distension. There is no tenderness. There is no guarding.  Musculoskeletal: No edema or tenderness.  Lymphadenopathy: No cervical, axillary or supraclavicular adenopathy.  Neurological: Alert and oriented to person, place, and time. No cranial nerve deficit.  Skin: Skin is warm and dry. No rash noted. No erythema. No pallor.  Psychiatric: Affect and judgment normal.   Labs No visits with results within 3 Day(s) from this visit.  Latest known visit with results is:  Appointment on 03/30/2018  Component Date Value Ref Range Status  . WBC 03/30/2018 4.8  4.0 - 10.5 K/uL Final  . RBC 03/30/2018 3.54* 4.22 - 5.81 MIL/uL Final  . Hemoglobin 03/30/2018 12.4* 13.0 - 17.0 g/dL Final  . HCT 03/30/2018 36.4* 39.0 - 52.0 % Final  . MCV 03/30/2018 102.8* 80.0 -  100.0 fL Final  . MCH 03/30/2018 35.0* 26.0 - 34.0 pg Final  . MCHC 03/30/2018 34.1  30.0 - 36.0 g/dL Final  . RDW 03/30/2018 12.7  11.5 - 15.5 % Final  . Platelets 03/30/2018 103* 150 - 400 K/uL Final   Comment: SPECIMEN CHECKED FOR CLOTS Immature Platelet Fraction may be clinically indicated, consider ordering this additional test [id number]  CONSISTENT WITH PREVIOUS RESULT   . nRBC 03/30/2018 0.0  0.0 - 0.2 % Final  . Neutrophils Relative % 03/30/2018 56  % Final  . Neutro Abs 03/30/2018 2.7  1.7 - 7.7 K/uL Final  . Lymphocytes Relative 03/30/2018 32  % Final  . Lymphs Abs 03/30/2018 1.5  0.7 - 4.0 K/uL Final  . Monocytes Relative 03/30/2018 10  % Final  . Monocytes Absolute 03/30/2018 0.5  0.1 - 1.0 K/uL Final  . Eosinophils Relative 03/30/2018 2  % Final  . Eosinophils Absolute 03/30/2018 0.1  0.0 - 0.5 K/uL Final  . Basophils Relative 03/30/2018 0  % Final  . Basophils Absolute 03/30/2018 0.0  0.0 - 0.1 K/uL Final  . Immature Granulocytes 03/30/2018 0  % Final  . Abs Immature Granulocytes 03/30/2018 0.01  0.00 - 0.07 K/uL Final   Performed at [facility], [address] [place], [place] [address]  . Sodium 03/30/2018 133* 135 - 145 mmol/L Final  . Potassium 03/30/2018 5.3* 3.5 - 5.1 mmol/L Final  . Chloride 03/30/2018 105  98 - 111 mmol/L Final  . CO2 03/30/2018 26  22 - 32 mmol/L Final  . Glucose, Bld 03/30/2018 179* 70 - 99 mg/dL Final  . BUN 03/30/2018 21  8 - 23 mg/dL Final  . Creatinine, Ser 03/30/2018 1.05  0.61 - 1.24 mg/dL Final  . Calcium 03/30/2018 9.0  8.9 - 10.3 mg/dL Final  . Total Protein 03/30/2018 8.0  6.5 - 8.1 g/dL Final  . Albumin 03/30/2018 4.0  3.5 - 5.0 g/dL Final  . AST 03/30/2018 38  15 - 41 U/L Final  . ALT 03/30/2018 30  0 - 44 U/L Final  . Alkaline Phosphatase 03/30/2018 84  38 - 126 U/L Final  . Total Bilirubin 03/30/2018 1.3* 0.3 - 1.2 mg/dL Final  . GFR calc non Af Amer 03/30/2018 >60  >60 mL/min Final  . GFR calc Af Amer 03/30/2018 >60  >60  mL/min Final  . Anion gap 03/30/2018 <5* 5 - 15 Final   Performed at [facility], [address] [place], [place] [address]  . LDH 03/30/2018 151  98 - 192 U/L Final   Performed at [facility], [address], [place], [place] [address]  . Kappa free light chain 03/30/2018 53.2* 3.3 - 19.4 mg/L Final  . Lamda free light chains 03/30/2018 20.5  5.7 - 26.3 mg/L Final  . Kappa, lamda light chain ratio 03/30/2018 2.60* 0.26 - 1.65 Final   Comment: (NOTE) Performed At: [facility] [place], [place] [id number] Rush Farmer MD [id number]   . IgG (Immunoglobin G), Serum 03/30/2018 2,315* 700 - 1,600 mg/dL Final  . IgA 03/30/2018 165  61 - 437 mg/dL Final  . IgM (Immunoglobulin M), Srm 03/30/2018 149* 15 - 143 mg/dL Final   Comment: (NOTE) Performed At: [facility] [place], [place] [id number] Rush Farmer MD [id number]   . Beta-2 Microglobulin 03/30/2018 2.0  0.6 - 2.4 mg/L Final   Comment: (NOTE) Siemens Immulite 2000 Immunochemiluminometric assay (ICMA) Values obtained with different assay methods or kits cannot be used interchangeably. Results cannot be interpreted as absolute evidence of the presence or absence of malignant disease. Performed At: [facility] [place], [place] [id number] Rush Farmer MD [id number]      Pathology Orders Placed This Encounter  Procedures  . DG Bone Survey Met    Standing Status:   Future    Standing Expiration Date:  06/08/2019    Order Specific Question:   Reason for Exam (SYMPTOM  OR DIAGNOSIS REQUIRED)    Answer:   monoclonal gammopathy    Order Specific Question:   Preferred imaging location?    Answer:   [facility]    Order Specific Question:   Radiology Contrast Protocol - do NOT remove file path    Answer:   [email or web address]  . CBC with Differential/Platelet    Standing Status:   Future    Standing  Expiration Date:   04/10/2019  . Comprehensive metabolic panel    Standing Status:   Future    Standing Expiration Date:   04/10/2019  . Lactate dehydrogenase    Standing Status:   Future    Standing Expiration Date:   04/10/2019  . Protein electrophoresis, serum    Standing Status:   Future    Standing Expiration Date:   04/10/2019  . IgG, IgA, IgM    Standing Status:   Future    Standing Expiration Date:   04/10/2019  . Kappa/lambda light chains    Standing Status:   Future    Standing Expiration Date:   04/10/2019       Zoila Shutter MD

## 2018-04-13 ENCOUNTER — Ambulatory Visit: Payer: Medicare Other | Admitting: Internal Medicine

## 2018-04-13 ENCOUNTER — Encounter: Payer: Self-pay | Admitting: Internal Medicine

## 2018-04-13 VITALS — BP 166/86 | HR 63 | Ht 72.0 in | Wt 209.8 lb

## 2018-04-13 DIAGNOSIS — Z950 Presence of cardiac pacemaker: Secondary | ICD-10-CM | POA: Diagnosis not present

## 2018-04-13 DIAGNOSIS — I442 Atrioventricular block, complete: Secondary | ICD-10-CM

## 2018-04-13 DIAGNOSIS — G4733 Obstructive sleep apnea (adult) (pediatric): Secondary | ICD-10-CM | POA: Diagnosis not present

## 2018-04-13 DIAGNOSIS — I4821 Permanent atrial fibrillation: Secondary | ICD-10-CM

## 2018-04-13 DIAGNOSIS — I10 Essential (primary) hypertension: Secondary | ICD-10-CM

## 2018-04-13 NOTE — Progress Notes (Signed)
PCP: Monico Blitz, MD Primary Cardiologist: Dr Domenic Polite Primary EP:  Dr Rayann Heman  Jay Campbell is a 73 y.o. male who presents today for routine electrophysiology followup.  Since last being seen in our clinic, the patient reports doing reasonably well.  He has noticed a decline in his memory.  He had an abrupt change in mentation and was hospitalized at [facility] 03/11/18 (notes reviewed) for acute encephalopathy.  He has not had this again.   Today, he denies symptoms of palpitations, chest pain, shortness of breath,  lower extremity edema, dizziness, presyncope, or syncope.  The patient is otherwise without complaint today.   Past Medical History:  Diagnosis Date  . Anxiety   . Atrial fibrillation ([place])    Permanent  . Coronary artery disease    Mild nonobstructive 1/12  . DM (diabetes mellitus) ([place])   . GERD (gastroesophageal reflux disease)   . Gout   . Hearing disorder, cochlear   . Hiatal hernia   . HLD (hyperlipidemia)   . HTN (hypertension)   . S/P AV nodal ablation    s/p SJM PPM; gen change 02-01-13 by Dr Rayann Heman  . Smoldering multiple myeloma ([name]) 12/29/2017  . Warfarin anticoagulation    Past Surgical History:  Procedure Laterality Date  . CATARACT EXTRACTION Right   . COCHLEAR IMPLANT Right   . HEMORRHOID BANDING    . PACEMAKER GENERATOR CHANGE Bilateral 02/01/2013   Procedure: PACEMAKER GENERATOR CHANGE;  Surgeon: Coralyn Mark, MD;  Location: [facility]ATH [facility];  Service: Cardiovascular;  Laterality: Bilateral;  . PACEMAKER PLACEMENT  2002; 2014   SJM implanted by Dr Lovena Le with AV nodal ablation performed; gen change to Accent SR RF by Dr Rayann Heman 02-01-13  . RETINAL LASER PROCEDURE Left   . SLT LASER APPLICATION Left 16/03/958   Procedure: SLT LASER APPLICATION;  Surgeon: Williams Che, MD;  Location: [facility];  Service: Ophthalmology;  Laterality: Left;    ROS- all systems are reviewed and negative except as per HPI above  Current Outpatient Medications    Medication Sig Dispense Refill  . allopurinol (ZYLOPRIM) 100 MG tablet Take 100 mg by mouth Daily.     . clonazePAM (KLONOPIN) 0.5 MG tablet Take 0.5 mg by mouth daily.     . colchicine 0.6 MG tablet Take 0.6 mg by mouth daily.    . fish oil-omega-3 fatty acids 1000 MG capsule Take 1 g by mouth 2 (two) times daily.      Marland Kitchen HUMULIN 70/30 (70-30) 100 UNIT/ML injection Inject 30 Units into the skin 2 (two) times daily with a meal.     . isosorbide mononitrate (IMDUR) 60 MG 24 hr tablet Take 120 mg by mouth daily.    Marland Kitchen loperamide (IMODIUM) 2 MG capsule Take 1 capsule (2 mg total) by mouth 4 (four) times daily as needed for diarrhea or loose stools. 12 capsule 0  . metFORMIN (GLUCOPHAGE) 1000 MG tablet Take 1,000 mg by mouth 2 (two) times daily with a meal.      . metoprolol succinate (TOPROL-XL) 25 MG 24 hr tablet Take 0.5 tablets by mouth Daily.    . nitroGLYCERIN (NITROSTAT) 0.4 MG SL tablet Place 1 tablet (0.4 mg total) under the tongue every 5 (five) minutes x 3 doses as needed. 25 tablet 3  . ONETOUCH VERIO test strip     . sertraline (ZOLOFT) 50 MG tablet Take 50 mg by mouth daily. Take 1  tab daily    . simvastatin (  ZOCOR) 40 MG tablet Take 40 mg by mouth at bedtime.      Marland Kitchen warfarin (COUMADIN) 5 MG tablet Take 5-7.5 mg by mouth daily. 7.5 mg on Monday and Friday and 5 mg all other days (MANAGED BY PMD)     No current facility-administered medications for this visit.     Physical Exam: Vitals:   04/13/18 0954  BP: (!) 166/86  Pulse: 63  SpO2: 98%  Weight: 209 lb 12.8 oz (95.2 kg)  Height: 6' (1.829 m)    GEN- The patient is well appearing, alert and oriented x 3 today.   Head- normocephalic, atraumatic Eyes-  Sclera clear, conjunctiva pink Ears- hearing intact Oropharynx- clear Lungs- Clear to ausculation bilaterally, normal work of breathing Chest- pacemaker pocket is well healed Heart- Regular rate and rhythm (paced) GI- soft, NT, ND, + BS Extremities- no clubbing,  cyanosis, or edema  Pacemaker interrogation- reviewed in detail today,  See PACEART report    Assessment and Plan:  1. Symptomatic complete heart block s/p AV nodal ablation Normal pacemaker function See Pace Art report No changes today  2. Permanent afib Anticoagulated and rate controlled  3. HTN bp elevated today He reports good BP control at home  4. OSA Compliant with CPAP  5. Memory disorder He will follow-up with primary care  Merlin Return in a year  Thompson Grayer MD, [facility] 04/13/2018 10:40 AM

## 2018-04-13 NOTE — Patient Instructions (Signed)
Medication Instructions:  Continue all current medications.  Labwork: none  Testing/Procedures: none  Follow-Up: 1 year   Any Other Special Instructions Will Be Listed Below (If Applicable).  If you need a refill on your cardiac medications before your next appointment, please call your pharmacy.  

## 2018-04-16 ENCOUNTER — Ambulatory Visit (INDEPENDENT_AMBULATORY_CARE_PROVIDER_SITE_OTHER): Payer: Medicare Other

## 2018-04-16 DIAGNOSIS — I4891 Unspecified atrial fibrillation: Secondary | ICD-10-CM

## 2018-04-16 DIAGNOSIS — I442 Atrioventricular block, complete: Secondary | ICD-10-CM

## 2018-04-19 LAB — CUP PACEART REMOTE DEVICE CHECK
Battery Remaining Longevity: 137 mo
Battery Remaining Percentage: 95.5 %
Battery Voltage: 3.01 V
Implantable Lead Implant Date: 20020215
Implantable Lead Location: 753860
Implantable Pulse Generator Implant Date: 20141121
Lead Channel Impedance Value: 590 Ohm
Lead Channel Pacing Threshold Amplitude: 1.625 V
Lead Channel Pacing Threshold Pulse Width: 0.4 ms
Lead Channel Sensing Intrinsic Amplitude: 12 mV
Lead Channel Setting Pacing Amplitude: 1.875
Lead Channel Setting Pacing Pulse Width: 0.4 ms
Lead Channel Setting Sensing Sensitivity: 6 mV
MDC IDC SESS DTM: 20200203092817
MDC IDC STAT BRADY RV PERCENT PACED: 99 %
Pulse Gen Model: 1240
Pulse Gen Serial Number: 7488472

## 2018-04-20 ENCOUNTER — Encounter: Payer: Medicare Other | Admitting: Internal Medicine

## 2018-05-02 NOTE — Progress Notes (Signed)
Remote pacemaker transmission.   

## 2018-05-30 LAB — CUP PACEART INCLINIC DEVICE CHECK
Battery Remaining Longevity: 132 mo
Battery Voltage: 3.01 V
Brady Statistic RV Percent Paced: 99.99 %
Date Time Interrogation Session: 20200131161107
Implantable Lead Implant Date: 20020215
Implantable Lead Location: 753860
Implantable Pulse Generator Implant Date: 20141121
Lead Channel Impedance Value: 612.5 Ohm
Lead Channel Pacing Threshold Amplitude: 1 V
Lead Channel Pacing Threshold Pulse Width: 0.4 ms
Lead Channel Setting Pacing Amplitude: 1.25 V
Lead Channel Setting Pacing Pulse Width: 0.4 ms
Lead Channel Setting Sensing Sensitivity: 6 mV
Pulse Gen Model: 1240
Pulse Gen Serial Number: 7488472

## 2018-07-23 ENCOUNTER — Other Ambulatory Visit: Payer: Self-pay

## 2018-07-23 ENCOUNTER — Encounter: Payer: Medicare Other | Admitting: *Deleted

## 2018-07-24 ENCOUNTER — Telehealth: Payer: Self-pay

## 2018-07-24 NOTE — Telephone Encounter (Signed)
Left message for patient to remind of missed remote transmission.  

## 2018-08-22 ENCOUNTER — Other Ambulatory Visit (HOSPITAL_COMMUNITY): Payer: Self-pay | Admitting: *Deleted

## 2018-08-22 DIAGNOSIS — D696 Thrombocytopenia, unspecified: Secondary | ICD-10-CM

## 2018-08-22 DIAGNOSIS — D539 Nutritional anemia, unspecified: Secondary | ICD-10-CM

## 2018-08-22 DIAGNOSIS — C9000 Multiple myeloma not having achieved remission: Secondary | ICD-10-CM

## 2018-08-22 DIAGNOSIS — D472 Monoclonal gammopathy: Secondary | ICD-10-CM

## 2018-08-22 DIAGNOSIS — D508 Other iron deficiency anemias: Secondary | ICD-10-CM

## 2018-08-23 ENCOUNTER — Inpatient Hospital Stay (HOSPITAL_COMMUNITY): Payer: Medicare Other | Attending: Hematology

## 2018-08-23 ENCOUNTER — Other Ambulatory Visit: Payer: Self-pay

## 2018-08-23 ENCOUNTER — Ambulatory Visit (HOSPITAL_COMMUNITY)
Admission: RE | Admit: 2018-08-23 | Discharge: 2018-08-23 | Disposition: A | Discharge status: Home / Self Care | Payer: Medicare Other | Source: Ambulatory Visit | Attending: Internal Medicine | Admitting: Internal Medicine

## 2018-08-23 DIAGNOSIS — N182 Chronic kidney disease, stage 2 (mild): Secondary | ICD-10-CM | POA: Diagnosis not present

## 2018-08-23 DIAGNOSIS — D508 Other iron deficiency anemias: Secondary | ICD-10-CM

## 2018-08-23 DIAGNOSIS — D539 Nutritional anemia, unspecified: Secondary | ICD-10-CM

## 2018-08-23 DIAGNOSIS — D472 Monoclonal gammopathy: Secondary | ICD-10-CM

## 2018-08-23 DIAGNOSIS — D649 Anemia, unspecified: Secondary | ICD-10-CM | POA: Insufficient documentation

## 2018-08-23 DIAGNOSIS — D696 Thrombocytopenia, unspecified: Secondary | ICD-10-CM | POA: Diagnosis not present

## 2018-08-23 DIAGNOSIS — C9000 Multiple myeloma not having achieved remission: Secondary | ICD-10-CM | POA: Diagnosis present

## 2018-08-23 LAB — COMPREHENSIVE METABOLIC PANEL
ALT: 23 U/L (ref 0–44)
AST: 28 U/L (ref 15–41)
Albumin: 3.8 g/dL (ref 3.5–5.0)
Alkaline Phosphatase: 88 U/L (ref 38–126)
Anion gap: 8 (ref 5–15)
BUN: 28 mg/dL — ABNORMAL HIGH (ref 8–23)
CO2: 25 mmol/L (ref 22–32)
Calcium: 9.4 mg/dL (ref 8.9–10.3)
Chloride: 101 mmol/L (ref 98–111)
Creatinine, Ser: 1.33 mg/dL — ABNORMAL HIGH (ref 0.61–1.24)
GFR calc Af Amer: >60 mL/min (ref >60)
GFR calc non Af Amer: 52 mL/min — ABNORMAL LOW (ref >60)
Glucose, Bld: 348 mg/dL — ABNORMAL HIGH (ref 70–99)
Potassium: 4.8 mmol/L (ref 3.5–5.1)
Sodium: 134 mmol/L — ABNORMAL LOW (ref 135–145)
Total Bilirubin: 0.9 mg/dL (ref 0.3–1.2)
Total Protein: 8 g/dL (ref 6.5–8.1)

## 2018-08-23 LAB — CBC WITH DIFFERENTIAL/PLATELET
Abs Immature Granulocytes: 0.01 10*3/uL (ref 0.00–0.07)
Basophils Absolute: 0 10*3/uL (ref 0.0–0.1)
Basophils Relative: 0 %
Eosinophils Absolute: 0.1 10*3/uL (ref 0.0–0.5)
Eosinophils Relative: 4 %
HCT: 37.8 % — ABNORMAL LOW (ref 39.0–52.0)
Hemoglobin: 12.8 g/dL — ABNORMAL LOW (ref 13.0–17.0)
Immature Granulocytes: 0 %
Lymphocytes Relative: 28 %
Lymphs Abs: 1.1 10*3/uL (ref 0.7–4.0)
MCH: 35.5 pg — ABNORMAL HIGH (ref 26.0–34.0)
MCHC: 33.9 g/dL (ref 30.0–36.0)
MCV: 104.7 fL — ABNORMAL HIGH (ref 80.0–100.0)
Monocytes Absolute: 0.3 10*3/uL (ref 0.1–1.0)
Monocytes Relative: 7 %
Neutro Abs: 2.4 10*3/uL (ref 1.7–7.7)
Neutrophils Relative %: 61 %
Platelets: 94 10*3/uL — ABNORMAL LOW (ref 150–400)
RBC: 3.61 MIL/uL — ABNORMAL LOW (ref 4.22–5.81)
RDW: 12.3 % (ref 11.5–15.5)
WBC: 3.8 10*3/uL — ABNORMAL LOW (ref 4.0–10.5)
nRBC: 0 % (ref 0.0–0.2)

## 2018-08-23 LAB — LACTATE DEHYDROGENASE: LDH: 142 U/L (ref 98–192)

## 2018-08-24 LAB — IGG, IGA, IGM
IgA: 168 mg/dL (ref 61–437)
IgG (Immunoglobin G), Serum: 2196 mg/dL — ABNORMAL HIGH (ref 603–1613)
IgM (Immunoglobulin M), Srm: 123 mg/dL (ref 15–143)

## 2018-08-24 LAB — KAPPA/LAMBDA LIGHT CHAINS
Kappa free light chain: 51.2 mg/L — ABNORMAL HIGH (ref 3.3–19.4)
Kappa, lambda light chain ratio: 2.68 — ABNORMAL HIGH (ref 0.26–1.65)
Lambda free light chains: 19.1 mg/L (ref 5.7–26.3)

## 2018-08-24 NOTE — Progress Notes (Signed)
For review.  Please update ordering provider

## 2018-08-27 LAB — PROTEIN ELECTROPHORESIS, SERUM
A/G Ratio: 0.9 (ref 0.7–1.7)
Albumin ELP: 3.6 g/dL (ref 2.9–4.4)
Alpha-1-Globulin: 0.2 g/dL (ref 0.0–0.4)
Alpha-2-Globulin: 0.8 g/dL (ref 0.4–1.0)
Beta Globulin: 0.8 g/dL (ref 0.7–1.3)
Gamma Globulin: 2.2 g/dL — ABNORMAL HIGH (ref 0.4–1.8)
Globulin, Total: 4 g/dL — ABNORMAL HIGH (ref 2.2–3.9)
M-Spike, %: 1.8 g/dL — ABNORMAL HIGH
Total Protein ELP: 7.6 g/dL (ref 6.0–8.5)

## 2018-08-29 ENCOUNTER — Other Ambulatory Visit: Payer: Self-pay

## 2018-08-30 ENCOUNTER — Inpatient Hospital Stay (HOSPITAL_BASED_OUTPATIENT_CLINIC_OR_DEPARTMENT_OTHER): Payer: Medicare Other | Admitting: Hematology

## 2018-08-30 ENCOUNTER — Encounter (HOSPITAL_COMMUNITY): Payer: Self-pay | Admitting: Hematology

## 2018-08-30 VITALS — BP 136/72 | HR 79 | Temp 98.3°F | Resp 16 | Wt 207.4 lb

## 2018-08-30 DIAGNOSIS — C9000 Multiple myeloma not having achieved remission: Secondary | ICD-10-CM

## 2018-08-30 DIAGNOSIS — D696 Thrombocytopenia, unspecified: Secondary | ICD-10-CM | POA: Diagnosis not present

## 2018-08-30 DIAGNOSIS — D649 Anemia, unspecified: Secondary | ICD-10-CM

## 2018-08-30 DIAGNOSIS — D472 Monoclonal gammopathy: Secondary | ICD-10-CM

## 2018-08-30 DIAGNOSIS — N182 Chronic kidney disease, stage 2 (mild): Secondary | ICD-10-CM | POA: Diagnosis not present

## 2018-08-30 NOTE — Patient Instructions (Signed)
[facility] Discharge Instructions  You were seen today by Dr. Katragadda. He went over your recent lab results. He will see you back in 3 months for labs and follow up.   Thank you for choosing [facility] at [facility] to provide your oncology and hematology care.  To afford each patient quality time with our provider, please arrive at least 15 minutes before your scheduled appointment time.   If you have a lab appointment with the Cancer Center please come in thru the  Main Entrance and check in at the main information desk  You need to re-schedule your appointment should you arrive 10 or more minutes late.  We strive to give you quality time with our providers, and arriving late affects you and other patients whose appointments are after yours.  Also, if you no show three or more times for appointments you may be dismissed from the clinic at the providers discretion.     Again, thank you for choosing [facility].  Our hope is that these requests will decrease the amount of time that you wait before being seen by our physicians.       _____________________________________________________________  Should you have questions after your visit to [facility], please contact our office at [phone] between the hours of 8:00 a.m. and 4:30 p.m.  Voicemails left after 4:00 p.m. will not be returned until the following business day.  For prescription refill requests, have your pharmacy contact our office and allow 72 hours.    Cancer Center Support Programs:   > Cancer Support Group  2nd Tuesday of the month 1pm-2pm, Journey Room    

## 2018-08-30 NOTE — Progress Notes (Signed)
Patient Care Team: Monico Blitz, MD as PCP - General (Internal Medicine) Satira Sark, MD as PCP - Cardiology (Cardiology)  DIAGNOSIS:  Encounter Diagnosis  Name Primary?  . Smoldering multiple myeloma ([place]) Yes    SUMMARY OF ONCOLOGIC HISTORY: Oncology History   No history exists.    CHIEF COMPLIANT: Smoldering multiple myeloma.  INTERVAL HISTORY: Jay Campbell is a 73 year old very pleasant white male who is seen in follow-up visit for smoldering myeloma.  Denies any new onset bone pains.  Denies any fevers, night sweats or weight loss in the last 6 months.  Denies any hospitalizations.  Denies any recurrent infections.  Appetite is 100%.  Energy levels are 75%.  No bleeding per rectum or melena.  No recent ER visits or hospitalizations.  No easy bruising or bleeding reported.  REVIEW OF SYSTEMS:   Constitutional: Denies fevers, chills or abnormal weight loss Eyes: Denies blurriness of vision Ears, nose, mouth, throat, and face: Denies mucositis or sore throat Respiratory: Denies cough, dyspnea or wheezes Cardiovascular: Denies palpitation, chest discomfort Gastrointestinal:  Denies nausea, heartburn or change in bowel habits Skin: Denies abnormal skin rashes Lymphatics: Denies new lymphadenopathy or easy bruising Neurological:Denies numbness, tingling or new weaknesses Behavioral/Psych: Mood is stable, no new changes  Extremities: No lower extremity edema All other systems were reviewed with the patient and are negative.  I have reviewed the past medical history, past surgical history, social history and family history with the patient and they are unchanged from previous note.  ALLERGIES:  is allergic to contrast media [iodinated diagnostic agents]; shellfish allergy; and sulfonamide derivatives.  MEDICATIONS:  Current Outpatient Medications  Medication Sig Dispense Refill  . allopurinol (ZYLOPRIM) 100 MG tablet Take 100 mg by mouth Daily.     . clonazePAM  (KLONOPIN) 0.5 MG tablet Take 0.5 mg by mouth daily.     . famotidine (PEPCID) 20 MG tablet Take 20 mg by mouth 2 (two) times daily.    . fish oil-omega-3 fatty acids 1000 MG capsule Take 1 g by mouth 2 (two) times daily.      Marland Kitchen HUMULIN 70/30 (70-30) 100 UNIT/ML injection Inject 30 Units into the skin 2 (two) times daily with a meal.     . isosorbide mononitrate (IMDUR) 60 MG 24 hr tablet Take 120 mg by mouth daily.    . metFORMIN (GLUCOPHAGE) 1000 MG tablet Take 1,000 mg by mouth 2 (two) times daily with a meal.      . metoprolol succinate (TOPROL-XL) 25 MG 24 hr tablet Take 0.5 tablets by mouth Daily.    Glory Rosebush VERIO test strip     . sertraline (ZOLOFT) 50 MG tablet Take 50 mg by mouth daily. Take 1  tab daily    . simvastatin (ZOCOR) 40 MG tablet Take 40 mg by mouth at bedtime.      Marland Kitchen warfarin (COUMADIN) 5 MG tablet Take 5-7.5 mg by mouth daily. 7.5 mg on Monday and Friday and 5 mg all other days (MANAGED BY PMD)    . colchicine 0.6 MG tablet Take 0.6 mg by mouth daily.    Marland Kitchen loperamide (IMODIUM) 2 MG capsule Take 1 capsule (2 mg total) by mouth 4 (four) times daily as needed for diarrhea or loose stools. (Patient not taking: Reported on 08/30/2018) 12 capsule 0  . nitroGLYCERIN (NITROSTAT) 0.4 MG SL tablet Place 1 tablet (0.4 mg total) under the tongue every 5 (five) minutes x 3 doses as needed. (Patient not taking: Reported on  08/30/2018) 25 tablet 3   No current facility-administered medications for this visit.     PHYSICAL EXAMINATION: ECOG PERFORMANCE STATUS: 1 - Symptomatic but completely ambulatory  Vitals:   08/30/18 1100  BP: 136/72  Pulse: 79  Resp: 16  Temp: 98.3 F (36.8 C)  SpO2: 99%   Filed Weights   08/30/18 1100  Weight: 207 lb 6 oz (94.1 kg)    GENERAL:alert, no distress and comfortable SKIN: skin color, texture, turgor are normal, no rashes or significant lesions EYES: normal, Conjunctiva are pink and non-injected, sclera clear OROPHARYNX:no mucositis, no  erythema and lips, buccal mucosa, and tongue normal  NECK: supple, thyroid normal size, non-tender, without nodularity LYMPH:  no palpable lymphadenopathy in the cervical, axillary or inguinal LUNGS: clear to auscultation and percussion with normal breathing effort HEART: regular rate & rhythm and no murmurs and no lower extremity edema ABDOMEN:abdomen soft, non-tender and normal bowel sounds MUSCULOSKELETAL:no cyanosis of digits and no clubbing   EXTREMITIES: No lower extremity edema   LABORATORY DATA:  I have reviewed the data as listed CMP Latest Ref Rng & Units 08/23/2018 03/30/2018 03/12/2018  Glucose 70 - 99 mg/dL 348(H) 179(H) 140(H)  BUN 8 - 23 mg/dL 28(H) 21 24(H)  Creatinine 0.61 - 1.24 mg/dL 1.33(H) 1.05 1.25(H)  Sodium 135 - 145 mmol/L 134(L) 133(L) 137  Potassium 3.5 - 5.1 mmol/L 4.8 5.3(H) 4.5  Chloride 98 - 111 mmol/L 101 105 111  CO2 22 - 32 mmol/L 25 26 21(L)  Calcium 8.9 - 10.3 mg/dL 9.4 9.0 8.1(L)  Total Protein 6.5 - 8.1 g/dL 8.0 8.0 -  Total Bilirubin 0.3 - 1.2 mg/dL 0.9 1.3(H) -  Alkaline Phos 38 - 126 U/L 88 84 -  AST 15 - 41 U/L 28 38 -  ALT 0 - 44 U/L 23 30 -   No results found for: [id number]   Lab Results  Component Value Date   WBC 3.8 (L) 08/23/2018   HGB 12.8 (L) 08/23/2018   HCT 37.8 (L) 08/23/2018   MCV 104.7 (H) 08/23/2018   PLT 94 (L) 08/23/2018   NEUTROABS 2.4 08/23/2018    ASSESSMENT & PLAN:  Smoldering multiple myeloma ([facility]) 1.  IgG kappa smoldering myeloma: - Bone marrow biopsy on 09/05/2017 showing 12% plasma cells. - Bone survey on 08/23/2018 was negative for lytic lesions. - I have reviewed his labs.  Mild CKD with creatinine 1.3 stable.  Calcium was normal.  Hemoglobin was 12.8 and also stable. -We reviewed serum protein electrophoresis which shows slight increase in M spike of 1.8 g/dL.  Previously it was 1.6 g/dL. - Free light chain ratio has slightly increased to 2.68.  Previously it was 2.6.  Free kappa light chains are 51.2. -I  had a prolonged discussion with the patient about normal prognosis of smoldering myeloma with a 10% risk of progression to active myeloma per year. - I have recommended close follow-up with 3 months lab checks.  I have also called his daughter and talk to her.  2.  Macrocytic anemia: - This is likely from CKD.  We will keep a close eye on it. - If there is any worsening, will consider work-up including ferritin, iron panel, O35 and folic acid.  3.  Thrombocytopenia: -He has mild to moderate thrombocytopenia.  Latest platelet count was 94. -Bone marrow biopsy a year ago showed normal megakaryocytes.  Most likely etiology is immune mediated thrombocytopenia.   Total time spent is 25 minutes with more than 50% of the  time spent face-to-face discussing disease prognosis, follow-up visits and coordination of care.  Orders Placed This Encounter  Procedures  . CBC with Differential/Platelet    Standing Status:   Future    Standing Expiration Date:   08/30/2019  . Comprehensive metabolic panel    Standing Status:   Future    Standing Expiration Date:   08/30/2019  . Protein electrophoresis, serum    Standing Status:   Future    Standing Expiration Date:   08/30/2019  . Kappa/lambda light chains    Standing Status:   Future    Standing Expiration Date:   08/30/2019  . Lactate dehydrogenase    Standing Status:   Future    Standing Expiration Date:   08/30/2019  . Immunofixation electrophoresis    Standing Status:   Future    Standing Expiration Date:   08/30/2019   The patient has a good understanding of the overall plan. he agrees with it. he will call with any problems that may develop before the next visit here.   Derek Jack, MD 08/30/18

## 2018-08-30 NOTE — Assessment & Plan Note (Signed)
1.  IgG kappa smoldering myeloma: - Bone marrow biopsy on 09/05/2017 showing 12% plasma cells. - Bone survey on 08/23/2018 was negative for lytic lesions. - I have reviewed his labs.  Mild CKD with creatinine 1.3 stable.  Calcium was normal.  Hemoglobin was 12.8 and also stable. -We reviewed serum protein electrophoresis which shows slight increase in M spike of 1.8 g/dL.  Previously it was 1.6 g/dL. - Free light chain ratio has slightly increased to 2.68.  Previously it was 2.6.  Free kappa light chains are 51.2. -I had a prolonged discussion with the patient about normal prognosis of smoldering myeloma with a 10% risk of progression to active myeloma per year. - I have recommended close follow-up with 3 months lab checks.  I have also called his daughter and talk to her.  2.  Macrocytic anemia: - This is likely from CKD.  We will keep a close eye on it. - If there is any worsening, will consider work-up including ferritin, iron panel, N01 and folic acid.  3.  Thrombocytopenia: -He has mild to moderate thrombocytopenia.  Latest platelet count was 94. -Bone marrow biopsy a year ago showed normal megakaryocytes.  Most likely etiology is immune mediated thrombocytopenia.

## 2018-10-02 ENCOUNTER — Emergency Department (HOSPITAL_COMMUNITY)
Admission: EM | Admit: 2018-10-02 | Discharge: 2018-10-02 | Disposition: A | Discharge status: Home / Self Care | Payer: Medicare Other | Attending: Emergency Medicine | Admitting: Emergency Medicine

## 2018-10-02 ENCOUNTER — Emergency Department (HOSPITAL_COMMUNITY): Payer: Medicare Other

## 2018-10-02 ENCOUNTER — Encounter (HOSPITAL_COMMUNITY): Payer: Self-pay | Admitting: Emergency Medicine

## 2018-10-02 ENCOUNTER — Other Ambulatory Visit: Payer: Self-pay

## 2018-10-02 DIAGNOSIS — Y9389 Activity, other specified: Secondary | ICD-10-CM | POA: Insufficient documentation

## 2018-10-02 DIAGNOSIS — S32511A Fracture of superior rim of right pubis, initial encounter for closed fracture: Secondary | ICD-10-CM | POA: Diagnosis not present

## 2018-10-02 DIAGNOSIS — Y92003 Bedroom of unspecified non-institutional (private) residence as the place of occurrence of the external cause: Secondary | ICD-10-CM | POA: Diagnosis not present

## 2018-10-02 DIAGNOSIS — Z7901 Long term (current) use of anticoagulants: Secondary | ICD-10-CM | POA: Diagnosis not present

## 2018-10-02 DIAGNOSIS — S0003XA Contusion of scalp, initial encounter: Secondary | ICD-10-CM

## 2018-10-02 DIAGNOSIS — Z79899 Other long term (current) drug therapy: Secondary | ICD-10-CM | POA: Diagnosis not present

## 2018-10-02 DIAGNOSIS — S32501A Unspecified fracture of right pubis, initial encounter for closed fracture: Secondary | ICD-10-CM

## 2018-10-02 DIAGNOSIS — Z9621 Cochlear implant status: Secondary | ICD-10-CM | POA: Diagnosis not present

## 2018-10-02 DIAGNOSIS — W19XXXA Unspecified fall, initial encounter: Secondary | ICD-10-CM

## 2018-10-02 DIAGNOSIS — Y92009 Unspecified place in unspecified non-institutional (private) residence as the place of occurrence of the external cause: Secondary | ICD-10-CM

## 2018-10-02 DIAGNOSIS — Z950 Presence of cardiac pacemaker: Secondary | ICD-10-CM | POA: Insufficient documentation

## 2018-10-02 DIAGNOSIS — S32591A Other specified fracture of right pubis, initial encounter for closed fracture: Secondary | ICD-10-CM | POA: Insufficient documentation

## 2018-10-02 DIAGNOSIS — W06XXXA Fall from bed, initial encounter: Secondary | ICD-10-CM | POA: Insufficient documentation

## 2018-10-02 DIAGNOSIS — R52 Pain, unspecified: Secondary | ICD-10-CM

## 2018-10-02 DIAGNOSIS — E119 Type 2 diabetes mellitus without complications: Secondary | ICD-10-CM | POA: Diagnosis not present

## 2018-10-02 DIAGNOSIS — I10 Essential (primary) hypertension: Secondary | ICD-10-CM | POA: Diagnosis not present

## 2018-10-02 DIAGNOSIS — Y999 Unspecified external cause status: Secondary | ICD-10-CM | POA: Diagnosis not present

## 2018-10-02 DIAGNOSIS — M545 Low back pain, unspecified: Secondary | ICD-10-CM

## 2018-10-02 DIAGNOSIS — I2510 Atherosclerotic heart disease of native coronary artery without angina pectoris: Secondary | ICD-10-CM | POA: Diagnosis not present

## 2018-10-02 DIAGNOSIS — S79911A Unspecified injury of right hip, initial encounter: Secondary | ICD-10-CM | POA: Diagnosis present

## 2018-10-02 DIAGNOSIS — Z794 Long term (current) use of insulin: Secondary | ICD-10-CM | POA: Diagnosis not present

## 2018-10-02 LAB — BASIC METABOLIC PANEL
Anion gap: 8 (ref 5–15)
BUN: 29 mg/dL — ABNORMAL HIGH (ref 8–23)
CO2: 23 mmol/L (ref 22–32)
Calcium: 8.7 mg/dL — ABNORMAL LOW (ref 8.9–10.3)
Chloride: 105 mmol/L (ref 98–111)
Creatinine, Ser: 1.4 mg/dL — ABNORMAL HIGH (ref 0.61–1.24)
GFR calc Af Amer: 57 mL/min — ABNORMAL LOW (ref >60)
GFR calc non Af Amer: 49 mL/min — ABNORMAL LOW (ref >60)
Glucose, Bld: 246 mg/dL — ABNORMAL HIGH (ref 70–99)
Potassium: 4.9 mmol/L (ref 3.5–5.1)
Sodium: 136 mmol/L (ref 135–145)

## 2018-10-02 LAB — CBC WITH DIFFERENTIAL/PLATELET
Abs Immature Granulocytes: 0.04 10*3/uL (ref 0.00–0.07)
Basophils Absolute: 0 10*3/uL (ref 0.0–0.1)
Basophils Relative: 1 %
Eosinophils Absolute: 0.1 10*3/uL (ref 0.0–0.5)
Eosinophils Relative: 1 %
HCT: 37.3 % — ABNORMAL LOW (ref 39.0–52.0)
Hemoglobin: 12.8 g/dL — ABNORMAL LOW (ref 13.0–17.0)
Immature Granulocytes: 1 %
Lymphocytes Relative: 15 %
Lymphs Abs: 0.9 10*3/uL (ref 0.7–4.0)
MCH: 35.7 pg — ABNORMAL HIGH (ref 26.0–34.0)
MCHC: 34.3 g/dL (ref 30.0–36.0)
MCV: 103.9 fL — ABNORMAL HIGH (ref 80.0–100.0)
Monocytes Absolute: 0.5 10*3/uL (ref 0.1–1.0)
Monocytes Relative: 8 %
Neutro Abs: 4.5 10*3/uL (ref 1.7–7.7)
Neutrophils Relative %: 74 %
Platelets: 93 10*3/uL — ABNORMAL LOW (ref 150–400)
RBC: 3.59 MIL/uL — ABNORMAL LOW (ref 4.22–5.81)
RDW: 12.2 % (ref 11.5–15.5)
WBC: 6 10*3/uL (ref 4.0–10.5)
nRBC: 0 % (ref 0.0–0.2)

## 2018-10-02 LAB — PROTIME-INR
INR: 1.2 (ref 0.8–1.2)
Prothrombin Time: 14.6 seconds (ref 11.4–15.2)

## 2018-10-02 MED ORDER — OXYCODONE-ACETAMINOPHEN 5-325 MG PO TABS: 1.0000 | ORAL_TABLET | Freq: Four times a day (QID) | ORAL | 0 refills | Status: DC | PRN

## 2018-10-02 MED ORDER — OXYCODONE-ACETAMINOPHEN 5-325 MG PO TABS
1.0000 | ORAL_TABLET | Freq: Once | ORAL | Status: AC
  Administered 2018-10-02: 1 via ORAL
  Filled 2018-10-02: qty 1

## 2018-10-02 NOTE — ED Provider Notes (Signed)
[facility] EMERGENCY DEPARTMENT Provider Note   CSN: [id number] Arrival date & time: 10/02/18  0456  Time seen 5:20 AM  History   Chief Complaint Chief Complaint  Patient presents with  . Fall    HPI Jay Campbell is a 73 y.o. male.     HPI patient states he has a new bed and it is much higher than his old bed.  When he was getting out of bed this morning he lost his balance and fell hitting the right side of his head on the wall and knocking off his cochlear implant.  He did not fill onto the floor landing onto his right side.  He complains of pain in his right hip and his lower back since he fell.  He does not have a history of any chronic back pain.  He states he was able to walk but with a lot of pain.  He denies any loss of consciousness.  He does report some pain in the right side of his neck.  PCP Monico Blitz, MD   Past Medical History:  Diagnosis Date  . Anxiety   . Atrial fibrillation ([place])    Permanent  . Coronary artery disease    Mild nonobstructive 1/12  . DM (diabetes mellitus) ([place])   . GERD (gastroesophageal reflux disease)   . Gout   . Hearing disorder, cochlear   . Hiatal hernia   . HLD (hyperlipidemia)   . HTN (hypertension)   . S/P AV nodal ablation    s/p SJM PPM; gen change 02-01-13 by Dr Rayann Heman  . Smoldering multiple myeloma ([facility]) 12/29/2017  . Warfarin anticoagulation     Patient Active Problem List   Diagnosis Date Noted  . Acute encephalopathy 03/11/2018  . Mild renal insufficiency 03/11/2018  . Anxiety 03/11/2018  . Insulin-requiring or dependent type II diabetes mellitus ([place]) 03/11/2018  . Smoldering multiple myeloma ([employer]) 12/29/2017  . Precordial pain 03/20/2014  . Complete heart block ([place]) 02/01/2013  . S/P AV nodal ablation   . Warfarin anticoagulation   . CORONARY ATHEROSCLEROSIS NATIVE CORONARY ARTERY 02/01/2008  . Permanent atrial fibrillation 02/01/2008  . Cardiac pacemaker in situ 02/01/2008    Past Surgical  History:  Procedure Laterality Date  . CATARACT EXTRACTION Right   . COCHLEAR IMPLANT Right   . HEMORRHOID BANDING    . PACEMAKER GENERATOR CHANGE Bilateral 02/01/2013   Procedure: PACEMAKER GENERATOR CHANGE;  Surgeon: Coralyn Mark, MD;  Location: [facility] CATH [facility];  Service: Cardiovascular;  Laterality: Bilateral;  . PACEMAKER PLACEMENT  2002; 2014   SJM implanted by Dr Lovena Le with AV nodal ablation performed; gen change to [employer] SR RF by Dr Rayann Heman 02-01-13  . RETINAL LASER PROCEDURE Left   . SLT LASER APPLICATION Left 24/10/2498   Procedure: SLT LASER APPLICATION;  Surgeon: Williams Che, MD;  Location: [facility];  Service: Ophthalmology;  Laterality: Left;        Home Medications    Prior to Admission medications   Medication Sig Start Date End Date Taking? Authorizing Provider  allopurinol (ZYLOPRIM) 100 MG tablet Take 100 mg by mouth Daily.  12/15/11   [provider]  clonazePAM (KLONOPIN) 0.5 MG tablet Take 0.5 mg by mouth daily.  03/11/14   [provider]  colchicine 0.6 MG tablet Take 0.6 mg by mouth daily.    [provider]  famotidine (PEPCID) 20 MG tablet Take 20 mg by mouth 2 (two) times daily.    [provider]  fish oil-omega-3 fatty acids 1000 MG capsule Take 1 g by mouth 2 (two) times daily.      [provider]  HUMULIN 70/30 (70-30) 100 UNIT/ML injection Inject 30 Units into the skin 2 (two) times daily with a meal.  05/19/14   [provider]  isosorbide mononitrate (IMDUR) 60 MG 24 hr tablet Take 120 mg by mouth daily. 09/26/11   de Stanford Scotland, MD  loperamide (IMODIUM) 2 MG capsule Take 1 capsule (2 mg total) by mouth 4 (four) times daily as needed for diarrhea or loose stools. Patient not taking: Reported on 08/30/2018 04/11/17   Noemi Chapel, MD  metFORMIN (GLUCOPHAGE) 1000 MG tablet Take 1,000 mg by mouth 2 (two) times daily with a meal.      [provider]  metoprolol succinate (TOPROL-XL) 25 MG 24 hr  tablet Take 0.5 tablets by mouth Daily. 12/30/11   [provider]  nitroGLYCERIN (NITROSTAT) 0.4 MG SL tablet Place 1 tablet (0.4 mg total) under the tongue every 5 (five) minutes x 3 doses as needed. Patient not taking: Reported on 08/30/2018 04/15/16   Thompson Grayer, MD  [facility] VERIO test strip  08/17/17   [provider]  oxyCODONE-acetaminophen (PERCOCET) 5-325 MG tablet Take 1 tablet by mouth every 6 (six) hours as needed for severe pain. 10/02/18   Rolland Porter, MD  sertraline (ZOLOFT) 50 MG tablet Take 50 mg by mouth daily. Take 1  tab daily    [provider]  simvastatin (ZOCOR) 40 MG tablet Take 40 mg by mouth at bedtime.      [provider]  warfarin (COUMADIN) 5 MG tablet Take 5-7.5 mg by mouth daily. 7.5 mg on Monday and Friday and 5 mg all other days (MANAGED BY PMD)    [provider]    Family History Family History  Problem Relation Age of Onset  . COPD Mother   . Emphysema Mother   . Cancer - Lung Father   . Heart disease Paternal Uncle   . Stomach cancer Neg Hx   . Colon cancer Neg Hx     Social History Social History   Tobacco Use  . Smoking status: Never Smoker  . Smokeless tobacco: Never Used  Substance Use Topics  . Alcohol use: No    Alcohol/week: 0.0 standard drinks  . Drug use: No  lives at home Lives with spouse   Allergies   Contrast media [iodinated diagnostic agents], Shellfish allergy, and Sulfonamide derivatives   Review of Systems Review of Systems  All other systems reviewed and are negative.    Physical Exam Updated Vital Signs BP (!) 171/68 (BP Location: Left Arm)   Pulse (!) 59   Temp 97.8 F (36.6 C) (Oral)   Resp 18   Ht 6' (1.829 m)   Wt 94.3 kg   SpO2 99%   BMI 28.21 kg/m   Vital signs normal except for hypertension   Physical Exam Vitals signs and nursing note reviewed.  Constitutional:      Appearance: Normal appearance. He is normal weight.  HENT:     Head:  Normocephalic.     Comments: Pt has a small round red area just superior to the top of his right ear lobe when his cochlear implant had been. No active bleeding, no other swelling.     Right Ear: External ear normal.     Left Ear: External ear normal.     Nose: Nose normal.  Mouth/Throat:     Mouth: Mucous membranes are moist.     Pharynx: Oropharynx is clear.  Eyes:     Extraocular Movements: Extraocular movements intact.     Conjunctiva/sclera: Conjunctivae normal.     Pupils: Pupils are equal, round, and reactive to light.  Neck:     Musculoskeletal: Muscular tenderness present.      Comments: Patient is tender on the right side of his neck. Cardiovascular:     Rate and Rhythm: Normal rate and regular rhythm.     Heart sounds: Normal heart sounds.  Pulmonary:     Effort: Pulmonary effort is normal. No respiratory distress.     Breath sounds: Normal breath sounds.  Musculoskeletal:        General: Tenderness present.     Comments: Patient indicates some tenderness of his right posterior hip.  There is no obvious shortening of his leg or external rotation.  He also has some diffuse tenderness of his lumbar spine without localization.  Skin:    General: Skin is warm and dry.     Capillary Refill: Capillary refill takes less than 2 seconds.  Neurological:     General: No focal deficit present.     Mental Status: He is alert and oriented to person, place, and time.     Cranial Nerves: No cranial nerve deficit.  Psychiatric:        Mood and Affect: Mood normal.        Behavior: Behavior normal.        Thought Content: Thought content normal.      ED Treatments / Results  Labs (all labs ordered are listed, but only abnormal results are displayed) Results for orders placed or performed during the hospital encounter of 10/02/18  Protime-INR  Result Value Ref Range   Prothrombin Time 14.6 11.4 - 15.2 seconds   INR 1.2 0.8 - 1.2  Basic metabolic panel  Result Value Ref  Range   Sodium 136 135 - 145 mmol/L   Potassium 4.9 3.5 - 5.1 mmol/L   Chloride 105 98 - 111 mmol/L   CO2 23 22 - 32 mmol/L   Glucose, Bld 246 (H) 70 - 99 mg/dL   BUN 29 (H) 8 - 23 mg/dL   Creatinine, Ser 1.40 (H) 0.61 - 1.24 mg/dL   Calcium 8.7 (L) 8.9 - 10.3 mg/dL   GFR calc non Af Amer 49 (L) >60 mL/min   GFR calc Af Amer 57 (L) >60 mL/min   Anion gap 8 5 - 15  CBC with Differential  Result Value Ref Range   WBC 6.0 4.0 - 10.5 K/uL   RBC 3.59 (L) 4.22 - 5.81 MIL/uL   Hemoglobin 12.8 (L) 13.0 - 17.0 g/dL   HCT 37.3 (L) 39.0 - 52.0 %   MCV 103.9 (H) 80.0 - 100.0 fL   MCH 35.7 (H) 26.0 - 34.0 pg   MCHC 34.3 30.0 - 36.0 g/dL   RDW 12.2 11.5 - 15.5 %   Platelets 93 (L) 150 - 400 K/uL   nRBC 0.0 0.0 - 0.2 %   Neutrophils Relative % 74 %   Neutro Abs 4.5 1.7 - 7.7 K/uL   Lymphocytes Relative 15 %   Lymphs Abs 0.9 0.7 - 4.0 K/uL   Monocytes Relative 8 %   Monocytes Absolute 0.5 0.1 - 1.0 K/uL   Eosinophils Relative 1 %   Eosinophils Absolute 0.1 0.0 - 0.5 K/uL   Basophils Relative 1 %   Basophils Absolute  0.0 0.0 - 0.1 K/uL   Immature Granulocytes 1 %   Abs Immature Granulocytes 0.04 0.00 - 0.07 K/uL   Laboratory interpretation all normal except stable renal insufficiency, stable mild anemia    EKG None  Radiology Dg Lumbar Spine Complete  Result Date: 10/02/2018 CLINICAL DATA:  Fall from bed with low back pain EXAM: LUMBAR SPINE - COMPLETE 4+ VIEW COMPARISON:  Abdominal CT 04/11/2017 FINDINGS: Prominent osteopenia. No evidence of fracture or traumatic malalignment. Spondylosis and lower lumbar facet spurring. Atherosclerosis. IMPRESSION: No acute finding. Electronically Signed   By: Monte Fantasia M.D.   On: 10/02/2018 06:31   Ct Head Wo Contrast Ct Cervical Spine Wo Contrast  Result Date: 10/02/2018 CLINICAL DATA:  Fall with neck pain EXAM: CT HEAD WITHOUT CONTRAST CT CERVICAL SPINE WITHOUT CONTRAST TECHNIQUE: Multidetector CT imaging of the head and cervical spine  was performed following the standard protocol without intravenous contrast. Multiplanar CT image reconstructions of the cervical spine were also generated. COMPARISON:  03/11/2018 head CT FINDINGS: CT HEAD FINDINGS Brain: No evidence of acute infarction, hemorrhage, hydrocephalus, extra-axial collection or mass lesion/mass effect. Mild cerebral atrophy for age. Vascular: No hyperdense vessel or unexpected calcification. Skull: Normal. Negative for fracture or focal lesion. Sinuses/Orbits: No acute finding. Other: Bone anchored hearing aid on the right the small volume overlying gas. No adjacent fracture CT CERVICAL SPINE FINDINGS Alignment: No traumatic malalignment. Slight degenerative C5-6 anterolisthesis. Skull base and vertebrae: Negative for fracture or bone lesion Soft tissues and spinal canal: No prevertebral fluid or swelling. No visible canal hematoma. Disc levels:  Generalized degenerative facet spurring. Upper chest: Negative IMPRESSION: No evidence of acute intracranial or cervical spine injury. Electronically Signed   By: Monte Fantasia M.D.   On: 10/02/2018 06:29   Dg Hip Unilat With Pelvis 2-3 Views Right  Result Date: 10/02/2018 CLINICAL DATA:  Fall. EXAM: DG HIP (WITH OR WITHOUT PELVIS) 2-3V RIGHT COMPARISON:  CT 04/11/2017. FINDINGS: Fractures of the right superior and inferior pubic rami are noted. Degenerative change lumbar spine and both hips. Peripheral vascular calcification. Pelvic calcifications consistent with phleboliths. IMPRESSION: 1.  Fractures of the right superior and inferior pubic rami noted. 2. Diffuse osteopenia. Degenerative changes lumbar spine and both hips. 3. Peripheral vascular disease. Electronically Signed   By: Marcello Moores  Register   On: 10/02/2018 06:33    Procedures Procedures (including critical care time)  Medications Ordered in ED Medications  oxyCODONE-acetaminophen (PERCOCET/ROXICET) 5-325 MG per tablet 1 tablet (has no administration in time range)      Initial Impression / Assessment and Plan / ED Course  I have reviewed the triage vital signs and the nursing notes.  Pertinent labs & imaging results that were available during my care of the patient were reviewed by me and considered in my medical decision making (see chart for details).    Patient is on Coumadin.  Laboratory testing was done including a PT.  CT of the head and cervical spine was done to evaluate him for his acute trauma today.  Also plain x-rays of his lumbar spine and his hip were ordered again to evaluate his injuries from his fall today.  6:45 AM we discussed his radiology results.  Patient states they already have a W[place] at home that his wife had used but no longer needs.  I am going to have nursing staff show him how to use a Chavarin.  He was given Percocet for pain.  We also discussed admission for rehab and he does  not want to do that.  He states his wife is recovering from an extensive cancer surgery and cannot take care of him however he has a son and a daughter who live nearby that will help take care of him.  Patient has multiple myeloma and states he is already taking calcium supplements for that.  Final Clinical Impressions(s) / ED Diagnoses   Final diagnoses:  Pain  Fall  Fall in home, initial encounter  Closed fracture of right pubis, unspecified portion of pubis, initial encounter ([place])  Contusion of scalp, initial encounter  Acute midline low back pain without sciatica    ED Discharge Orders         Ordered    oxyCODONE-acetaminophen (PERCOCET) 5-325 MG tablet  Every 6 hours PRN     10/02/18 0704          Plan discharge  Rolland Porter, MD, Barbette Or, MD 10/02/18 [phone]

## 2018-10-02 NOTE — ED Notes (Signed)
Patient transported to X-ray 

## 2018-10-02 NOTE — Discharge Instructions (Addendum)
You have a fracture of 2 of the "pubic rami" on your right pelvis.  Take the Percocet for pain.  Use ice packs over the painful areas for comfort.  Use the Marm[place]jos you have at home until you are able to bear weight without a lot of pain.  Make sure you take your calcium supplements until your fracture heals.  Either follow-up with your primary care doctor or an orthopedist to make sure your fracture is healing and to get more pain medicine if needed.  Return to the emergency department if you get lightheaded or dizzy, feel weak, or feel worse in any way.  Also please look at the instructions for the head injury sheet, return to the ED for any problems listed there.

## 2018-10-02 NOTE — ED Triage Notes (Signed)
Patient fell trying to get out of his bed while going to the bathroom. Patient states he got a new mattress and was unaware of the height difference and lost his balance. Patient states he fell in to the wall with his head hitting his head back and right hip falling on the floor. Patient complaining of pain to all areas. Patient denies any loss of consciousness.

## 2018-10-05 ENCOUNTER — Telehealth: Payer: Self-pay | Admitting: Orthopedic Surgery

## 2018-10-05 NOTE — Telephone Encounter (Signed)
Jay Campbell was given Percocet at the ED.  His wife states that he doesn't like this medication.  She wanted to know if Dr. Aline Brochure would give him something else.  I told her that since Dr. Aline Brochure has not seen this patient yet, I did not think that he would do this.  I asked if Mr. Hanken has a PCP and she said that he did.  I suggested that she call that office and let them know what was going on and see if Dr. Manuella Ghazi would give something else for him.  She said she would do this.  I reminded her that Mr. Stranahan does have an appointment with Dr. Aline Brochure on Monday at 11:00.  She said he would be here.

## 2018-10-08 ENCOUNTER — Other Ambulatory Visit: Payer: Self-pay

## 2018-10-08 ENCOUNTER — Encounter: Payer: Self-pay | Admitting: Orthopedic Surgery

## 2018-10-08 ENCOUNTER — Ambulatory Visit (INDEPENDENT_AMBULATORY_CARE_PROVIDER_SITE_OTHER): Payer: Medicare Other | Admitting: Orthopedic Surgery

## 2018-10-08 VITALS — BP 158/41 | HR 64 | Temp 98.3°F | Ht 72.0 in | Wt 208.0 lb

## 2018-10-08 DIAGNOSIS — S32591A Other specified fracture of right pubis, initial encounter for closed fracture: Secondary | ICD-10-CM

## 2018-10-08 MED ORDER — TRAMADOL-ACETAMINOPHEN 37.5-325 MG PO TABS: 1.0000 | ORAL_TABLET | ORAL | 0 refills | Status: AC | PRN

## 2018-10-08 NOTE — Addendum Note (Signed)
Addended byCandice Camp on: 10/08/2018 11:09 AM   Modules accepted: Orders

## 2018-10-08 NOTE — Progress Notes (Signed)
Jay Campbell  10/08/2018  HISTORY SECTION :  Chief Complaint  Patient presents with  . Fall    10/02/2018 fall has pain since,  pelvis pain / knocked cochlear implant out    HPI The patient presents for evaluation of superior and inferior pubic ramus fracture with x-rays dated July 21 patient seen in the ER here for follow-up  The pain is on the right side Quality dull aching Severity mild to moderate Associated with pain with weightbearing but improving  Review of Systems  Constitutional: Negative for fever.  Respiratory: Negative for shortness of breath.   Cardiovascular: Positive for palpitations. Negative for chest pain.     Past Medical History:  Diagnosis Date  . Anxiety   . Atrial fibrillation ([place])    Permanent  . Coronary artery disease    Mild nonobstructive 1/12  . DM (diabetes mellitus) ([place])   . GERD (gastroesophageal reflux disease)   . Gout   . Hearing disorder, cochlear   . Hiatal hernia   . HLD (hyperlipidemia)   . HTN (hypertension)   . S/P AV nodal ablation    s/p SJM PPM; gen change 02-01-13 by Dr Rayann Heman  . Smoldering multiple myeloma ([place]) 12/29/2017  . Warfarin anticoagulation     Past Surgical History:  Procedure Laterality Date  . CATARACT EXTRACTION Right   . COCHLEAR IMPLANT Right   . HEMORRHOID BANDING    . KNEE ARTHROSCOPY Right 1992   Dr Luna Glasgow   . PACEMAKER GENERATOR CHANGE Bilateral 02/01/2013   Procedure: PACEMAKER GENERATOR CHANGE;  Surgeon: Coralyn Mark, MD;  Location: [facility];  Service: Cardiovascular;  Laterality: Bilateral;  . PACEMAKER PLACEMENT  2002; 2014   SJM implanted by Dr Lovena Le with AV nodal ablation performed; gen change to [employer] SR RF by Dr Rayann Heman 02-01-13  . RETINAL LASER PROCEDURE Left   . SLT LASER APPLICATION Left 65/09/8467   Procedure: SLT LASER APPLICATION;  Surgeon: Williams Che, MD;  Location: [facility];  Service: Ophthalmology;  Laterality: Left;     Allergies  Allergen Reactions  .  Contrast Media [Iodinated Diagnostic Agents]   . Shellfish Allergy Itching and Rash  . Sulfonamide Derivatives Itching and Rash     Current Outpatient Medications:  .  allopurinol (ZYLOPRIM) 100 MG tablet, Take 100 mg by mouth Daily. , Disp: , Rfl:  .  clonazePAM (KLONOPIN) 0.5 MG tablet, Take 0.5 mg by mouth daily. , Disp: , Rfl:  .  famotidine (PEPCID) 20 MG tablet, Take 20 mg by mouth 2 (two) times daily., Disp: , Rfl:  .  fish oil-omega-3 fatty acids 1000 MG capsule, Take 1 g by mouth 2 (two) times daily.  , Disp: , Rfl:  .  HUMULIN 70/30 (70-30) 100 UNIT/ML injection, Inject 30 Units into the skin 2 (two) times daily with a meal. , Disp: , Rfl:  .  isosorbide mononitrate (IMDUR) 60 MG 24 hr tablet, Take 120 mg by mouth daily., Disp: , Rfl:  .  metFORMIN (GLUCOPHAGE) 1000 MG tablet, Take 1,000 mg by mouth 2 (two) times daily with a meal.  , Disp: , Rfl:  .  metoprolol succinate (TOPROL-XL) 25 MG 24 hr tablet, Take 0.5 tablets by mouth Daily., Disp: , Rfl:  .  nitroGLYCERIN (NITROSTAT) 0.4 MG SL tablet, Place 1 tablet (0.4 mg total) under the tongue every 5 (five) minutes x 3 doses as needed., Disp: 25 tablet, Rfl: 3 .  ONETOUCH VERIO test strip, , Disp: , Rfl:  .  oxyCODONE-acetaminophen (PERCOCET) 5-325 MG tablet, Take 1 tablet by mouth every 6 (six) hours as needed for severe pain., Disp: 20 tablet, Rfl: 0 .  sertraline (ZOLOFT) 50 MG tablet, Take 50 mg by mouth daily. Take 1  tab daily, Disp: , Rfl:  .  simvastatin (ZOCOR) 40 MG tablet, Take 40 mg by mouth at bedtime.  , Disp: , Rfl:  .  warfarin (COUMADIN) 5 MG tablet, Take 5-7.5 mg by mouth daily. 7.5 mg on Monday and Friday and 5 mg all other days (MANAGED BY PMD), Disp: , Rfl:  .  colchicine 0.6 MG tablet, Take 0.6 mg by mouth daily., Disp: , Rfl:    PHYSICAL EXAM SECTION: 1) BP (!) 158/41   Pulse 64   Ht 6' (1.829 m)   Wt 208 lb (94.3 kg)   BMI 28.21 kg/m   Body mass index is 28.21 kg/m. General appearance: Well-developed  well-nourished no gross deformities  2) Cardiovascular normal pulse and perfusion his  extremities normal color without edema  3) Neurologically deep tendon reflexes are equal and normal, no sensation loss or deficits no pathologic reflexes  4) Psychological: Awake alert and oriented x3 mood and affect normal  5) Skin no lacerations or ulcerations no nodularity no palpable masses, no erythema or nodularity  6) Musculoskeletal:   Gait supported by Quadros  Limp favoring right side  Leg lengths equal  Pelvis stable  Muscle tone normal no tremor no atrophy  Mild pain with range of motion right hip no pain range of motion left both hips are stable MEDICAL DECISION SECTION:  Encounter Diagnosis  Name Primary?  . Closed fracture of multiple rami of right pubis, initial encounter ([place]) Yes    Imaging [facility] pelvis shows superior and inferior pubic ramus fracture on the right, degenerative changes are seen in the lower lumbar spine the hip joints have mild degenerative changes without joint space narrowing i.e. peripheral cysts and osteophytes    Plan:  (Rx., Inj., surg., Frx, MRI/CT, XR:2) Weight-bear as tolerated  Supportive care as needed including physical therapy assistive devices  Repeat x-ray in 6 weeks  [facility]    10:59 AM Arther Abbott, MD  10/08/2018

## 2018-10-09 ENCOUNTER — Telehealth: Payer: Self-pay | Admitting: Orthopedic Surgery

## 2018-10-09 NOTE — Telephone Encounter (Signed)
Call received from therapist, Ronalee Belts, [facility], requesting verbal orders. Direct [phone].

## 2018-10-09 NOTE — Telephone Encounter (Signed)
I have given him the verbals for therapy   He asked about orders for PT INR for his Coumadin management, advised that would have to come from Dr Manuella Ghazi his primary care  [facility] voiced understanding.

## 2018-10-11 ENCOUNTER — Telehealth: Payer: Self-pay | Admitting: Orthopedic Surgery

## 2018-10-11 NOTE — Telephone Encounter (Signed)
Thanks. To Dr Bill Salinas

## 2018-10-11 NOTE — Telephone Encounter (Signed)
Patient's wife/designated emergency contact, called to relay that patient has been admitted to [facility] since his visit here on Monday, 10/08/18. States he was unable to walk, or move much, along with other symptoms. States will keep [place] posted as to his status.

## 2018-11-12 ENCOUNTER — Telehealth: Payer: Self-pay

## 2018-11-12 NOTE — Telephone Encounter (Signed)
Debbie with [facility] called stating that she needs a order for this patient as follows.  Physical Therapy 2 times a week for 2 weeks.

## 2018-11-12 NOTE — Telephone Encounter (Signed)
Called Debbie with verbal

## 2018-11-21 ENCOUNTER — Ambulatory Visit: Payer: Medicare Other | Admitting: Orthopedic Surgery

## 2018-12-04 ENCOUNTER — Other Ambulatory Visit: Payer: Self-pay

## 2018-12-04 ENCOUNTER — Inpatient Hospital Stay (HOSPITAL_COMMUNITY): Payer: Medicare Other | Attending: Hematology

## 2018-12-04 DIAGNOSIS — D472 Monoclonal gammopathy: Secondary | ICD-10-CM

## 2018-12-04 DIAGNOSIS — R110 Nausea: Secondary | ICD-10-CM | POA: Insufficient documentation

## 2018-12-04 DIAGNOSIS — Z8249 Family history of ischemic heart disease and other diseases of the circulatory system: Secondary | ICD-10-CM | POA: Insufficient documentation

## 2018-12-04 DIAGNOSIS — D539 Nutritional anemia, unspecified: Secondary | ICD-10-CM | POA: Diagnosis not present

## 2018-12-04 DIAGNOSIS — C9000 Multiple myeloma not having achieved remission: Secondary | ICD-10-CM

## 2018-12-04 DIAGNOSIS — D696 Thrombocytopenia, unspecified: Secondary | ICD-10-CM | POA: Diagnosis not present

## 2018-12-04 DIAGNOSIS — M549 Dorsalgia, unspecified: Secondary | ICD-10-CM | POA: Insufficient documentation

## 2018-12-04 DIAGNOSIS — Z801 Family history of malignant neoplasm of trachea, bronchus and lung: Secondary | ICD-10-CM | POA: Diagnosis not present

## 2018-12-04 DIAGNOSIS — I4821 Permanent atrial fibrillation: Secondary | ICD-10-CM | POA: Diagnosis not present

## 2018-12-04 DIAGNOSIS — G479 Sleep disorder, unspecified: Secondary | ICD-10-CM | POA: Diagnosis not present

## 2018-12-04 DIAGNOSIS — R42 Dizziness and giddiness: Secondary | ICD-10-CM | POA: Diagnosis not present

## 2018-12-04 DIAGNOSIS — Z79899 Other long term (current) drug therapy: Secondary | ICD-10-CM | POA: Insufficient documentation

## 2018-12-04 DIAGNOSIS — E119 Type 2 diabetes mellitus without complications: Secondary | ICD-10-CM | POA: Insufficient documentation

## 2018-12-04 DIAGNOSIS — Z836 Family history of other diseases of the respiratory system: Secondary | ICD-10-CM | POA: Insufficient documentation

## 2018-12-04 DIAGNOSIS — Z7901 Long term (current) use of anticoagulants: Secondary | ICD-10-CM | POA: Insufficient documentation

## 2018-12-04 DIAGNOSIS — Z9181 History of falling: Secondary | ICD-10-CM | POA: Insufficient documentation

## 2018-12-04 LAB — COMPREHENSIVE METABOLIC PANEL
ALT: 24 U/L (ref 0–44)
AST: 33 U/L (ref 15–41)
Albumin: 3.6 g/dL (ref 3.5–5.0)
Alkaline Phosphatase: 115 U/L (ref 38–126)
Anion gap: 7 (ref 5–15)
BUN: 21 mg/dL (ref 8–23)
CO2: 25 mmol/L (ref 22–32)
Calcium: 9.6 mg/dL (ref 8.9–10.3)
Chloride: 103 mmol/L (ref 98–111)
Creatinine, Ser: 1.27 mg/dL — ABNORMAL HIGH (ref 0.61–1.24)
GFR calc Af Amer: >60 mL/min (ref >60)
GFR calc non Af Amer: 55 mL/min — ABNORMAL LOW (ref >60)
Glucose, Bld: 298 mg/dL — ABNORMAL HIGH (ref 70–99)
Potassium: 4.9 mmol/L (ref 3.5–5.1)
Sodium: 135 mmol/L (ref 135–145)
Total Bilirubin: 0.8 mg/dL (ref 0.3–1.2)
Total Protein: 7.6 g/dL (ref 6.5–8.1)

## 2018-12-04 LAB — LACTATE DEHYDROGENASE: LDH: 141 U/L (ref 98–192)

## 2018-12-04 LAB — CBC WITH DIFFERENTIAL/PLATELET
Abs Immature Granulocytes: 0.01 10*3/uL (ref 0.00–0.07)
Basophils Absolute: 0 10*3/uL (ref 0.0–0.1)
Basophils Relative: 0 %
Eosinophils Absolute: 0.1 10*3/uL (ref 0.0–0.5)
Eosinophils Relative: 2 %
HCT: 36.9 % — ABNORMAL LOW (ref 39.0–52.0)
Hemoglobin: 12 g/dL — ABNORMAL LOW (ref 13.0–17.0)
Immature Granulocytes: 0 %
Lymphocytes Relative: 19 %
Lymphs Abs: 0.8 10*3/uL (ref 0.7–4.0)
MCH: 34.9 pg — ABNORMAL HIGH (ref 26.0–34.0)
MCHC: 32.5 g/dL (ref 30.0–36.0)
MCV: 107.3 fL — ABNORMAL HIGH (ref 80.0–100.0)
Monocytes Absolute: 0.3 10*3/uL (ref 0.1–1.0)
Monocytes Relative: 6 %
Neutro Abs: 3.2 10*3/uL (ref 1.7–7.7)
Neutrophils Relative %: 73 %
Platelets: 125 10*3/uL — ABNORMAL LOW (ref 150–400)
RBC: 3.44 MIL/uL — ABNORMAL LOW (ref 4.22–5.81)
RDW: 13.2 % (ref 11.5–15.5)
WBC: 4.4 10*3/uL (ref 4.0–10.5)
nRBC: 0 % (ref 0.0–0.2)

## 2018-12-05 ENCOUNTER — Ambulatory Visit: Payer: Medicare Other | Admitting: Neurology

## 2018-12-05 ENCOUNTER — Other Ambulatory Visit: Payer: Self-pay

## 2018-12-05 ENCOUNTER — Encounter: Payer: Self-pay | Admitting: Neurology

## 2018-12-05 VITALS — BP 122/66 | HR 59 | Temp 97.7°F | Ht 72.0 in | Wt 191.0 lb

## 2018-12-05 DIAGNOSIS — R410 Disorientation, unspecified: Secondary | ICD-10-CM | POA: Diagnosis not present

## 2018-12-05 DIAGNOSIS — F0280 Dementia in other diseases classified elsewhere without behavioral disturbance: Secondary | ICD-10-CM

## 2018-12-05 DIAGNOSIS — S060X0A Concussion without loss of consciousness, initial encounter: Secondary | ICD-10-CM

## 2018-12-05 DIAGNOSIS — G301 Alzheimer's disease with late onset: Secondary | ICD-10-CM | POA: Diagnosis not present

## 2018-12-05 LAB — IMMUNOFIXATION ELECTROPHORESIS
IgA: 154 mg/dL (ref 61–437)
IgG (Immunoglobin G), Serum: 1918 mg/dL — ABNORMAL HIGH (ref 603–1613)
IgM (Immunoglobulin M), Srm: 105 mg/dL (ref 15–143)
Total Protein ELP: 7.1 g/dL (ref 6.0–8.5)

## 2018-12-05 LAB — PROTEIN ELECTROPHORESIS, SERUM
A/G Ratio: 1.1 (ref 0.7–1.7)
Albumin ELP: 3.8 g/dL (ref 2.9–4.4)
Alpha-1-Globulin: 0.2 g/dL (ref 0.0–0.4)
Alpha-2-Globulin: 0.7 g/dL (ref 0.4–1.0)
Beta Globulin: 0.8 g/dL (ref 0.7–1.3)
Gamma Globulin: 1.7 g/dL (ref 0.4–1.8)
Globulin, Total: 3.4 g/dL (ref 2.2–3.9)
M-Spike, %: 1.1 g/dL — ABNORMAL HIGH
Total Protein ELP: 7.2 g/dL (ref 6.0–8.5)

## 2018-12-05 LAB — KAPPA/LAMBDA LIGHT CHAINS
Kappa free light chain: 65.3 mg/L — ABNORMAL HIGH (ref 3.3–19.4)
Kappa, lambda light chain ratio: 3.17 — ABNORMAL HIGH (ref 0.26–1.65)
Lambda free light chains: 20.6 mg/L (ref 5.7–26.3)

## 2018-12-05 MED ORDER — MEMANTINE HCL 28 X 5 MG & 21 X 10 MG PO TABS: ORAL_TABLET | ORAL | 12 refills | Status: DC

## 2018-12-05 NOTE — Patient Instructions (Signed)
I had a long discussion with patient and his wife about his recent fall and concussion and worsening confusion and cognitive difficulties and his baseline dementia which is likely underlying Alzheimer's.  I recommend checking lab work for reversible causes of memory loss, EEG and trial of Namenda for cognitive improvement.  Continue warfarin for stroke prevention given history of atrial fibrillation.  He will return for follow-up in the future in 2 months or call earlier if necessary.   Alzheimer Disease Caregiver Guide  Alzheimer disease causes a person to lose the ability to remember things and make decisions. A person who has Alzheimer disease may not be able to take care of himself or herself. He or she may need help with simple tasks. The tips below can help you care for the person. What kind of changes does this condition cause? This condition makes a person:  Forget things.  Feel confused.  Act differently.  Have different moods. These things get worse with time. Tips to help with symptoms  Be calm and patient.  Respond with a simple, short answer.  Avoid correcting the person in a negative way.  Try not to take things personally, even if the person forgets your name.  Do not argue with the person. This may make the person more upset. Tips to lessen frustration  Make appointments and do daily tasks when the person is at his or her best.  Take your time. Simple tasks may take longer. Allow plenty of time to complete tasks.  Limit choices for the person.  Involve the person in what you are doing.  Keep a daily routine.  Avoid new or crowded places, if possible.  Use simple words, short sentences, and a calm voice. Only give one direction at a time.  Buy clothes and shoes that are easy to put on and take off.  Organize medicines in a pillbox for each day of the week.  Keep a calendar in a central location to remind the person of meetings or other activities.  Let  people help if they offer. Take a break when needed. Tips to prevent injury  Keep floors clear. Remove rugs, magazine racks, and floor lamps.  Keep hallways well-lit.  Put a handrail and non-slip mat in the bathtub or shower.  Put childproof locks on cabinets that have dangerous items in them. These items include medicine, alcohol, guns, toxic cleaning items, sharp tools, matches, and lighters.  Put locks on doors where the person cannot see or reach them. This helps the person to not wander out of the house and get lost.  Be prepared for emergencies. Keep a list of emergency phone numbers and addresses close by.  Bracelets may be worn that track location and identify the person as having memory problems. This should be worn at all times for safety. Tips for the future  Discuss financial and legal planning early. People with this disease have trouble managing their money as the disease gets worse. Get help from a professional.  Talk about advance directives, safety, and daily care. Take these steps: ? Create a living will and choose a power of attorney. This is someone who can make decisions for the person with Alzheimer disease when he or she can no longer do so. ? Discuss driving safety and when to stop driving. The person's doctor can help with this. ? If the person lives alone, make sure he or she is safe. Some people need extra help at home. Other people need more  care at a nursing home or care center. Where to find support You can find support by joining a support group near you. Some benefits of joining a support group include:  Learning ways to manage stress.  Sharing experiences with others.  Getting emotional comfort and support.  Learning about caregiving as the disease progresses.  Knowing what community resources are available and making use of them. Where to find more information  Alzheimer's Association: [employer] Contact a doctor if:  The person has a fever.   The person has a sudden behavior change that does not get better with calming strategies.  The person is not able to take care of himself or herself at home.  The person threatens you or anyone else, including himself or herself.  You are no longer able to care for the person. Summary  Alzheimer disease causes a person to forget things and to be confused.  A person who has this condition may not be able to take care of himself or herself.  Take steps to keep the person from getting hurt. Plan for future care.  You can find support by joining a support group near you. This information is not intended to replace advice given to you by your health care provider. Make sure you discuss any questions you have with your health care provider. Document Released: 05/23/2011 Document Revised: 06/19/2018 Document Reviewed: 02/23/2017 Elsevier Patient Education  2020 [employer].

## 2018-12-05 NOTE — Progress Notes (Signed)
[facility] [address] [place]. [place] [address] [phone]       OFFICE CONSULT NOTE  Jay Campbell Date of Birth:  [birth year] Medical Record Number:  [id number]   Referring MD: Jay Campbell  Reason for Referral: Dementia  HPI: [name] is 73 year old Caucasian male seen today for initial office consultation visit dementia and confusion following recent head injury.  History is obtained from the patient and his wife who is accompanying him as well as review of referral notes and have personally reviewed imaging films in PACS.  He has a past medical history of diabetes, hypertension, hyperlipidemia lipidemia, paroxysmal A. fib on long-term warfarin, CHF, pacemaker placement and depression.  Patient wife states that he is had longstanding history of greater than a year of mild cognitive impairment and suspected dementia which was not never formally diagnosed with he was doing well at home and was compensating.  He had a fall in July 2020 when he hit his head and fell to the ground and dislodged his cochlear implant from his right mastoid region.  He was confused weak and disoriented.  He was admitted to [facility] for several days.  CT scan of the head on admission on 10/11/2018 showed no acute abnormality.  Repeat CT scan on 10/15/2018 also showed no acute abnormality.  MRI could not be obtained since he has a pacemaker.  Admission labs unremarkable CBC and complete metabolic panel labs were normal.  UA showed no infection.  Patient's cognitive status and confusion has never returned back to baseline following this and his wife is concerned about dementia.  Patient did not have any focal neurological deficits in the form of slurred speech, extremity weakness numbness loss of vision during this hospitalization.  Patient primary physician felt that he likely had a stroke even though it was not seen on CT scan.  Patient did have some headaches after this episode which  appear to have improved however his confusion and cognitive decline have persisted.  Patient does not have any family history of Alzheimer's.  There is no prior history of strokes, seizures, loss of consciousness or major head injury with loss of consciousness.  Patient has never had any lab work for reversible causes of dementia or a trial of medication like Aricept or Namenda.  He continues to have poor appetite and his intake is not good though his wife has convinced him to take Glucerna nutritional supplement.  There are no delusions, hallucinations, agitations, unsafe behavior.  He can walk independently and has not had recurrent falls.  ROS:   14 system review of systems is positive for fall, head injury, memory loss, confusion, disorientation, decreased appetite, generalized weakness, lethargy and all other systems negative  PMH:  Past Medical History:  Diagnosis Date  . Anxiety   . Atrial fibrillation ([place])    Permanent  . Coronary artery disease    Mild nonobstructive 1/12  . DM (diabetes mellitus) ([place])   . GERD (gastroesophageal reflux disease)   . Gout   . Hearing disorder, cochlear   . Hiatal hernia   . HLD (hyperlipidemia)   . HTN (hypertension)   . S/P AV nodal ablation    s/p SJM PPM; gen change 02-01-13 by Dr Rayann Heman  . Smoldering multiple myeloma ([place]) 12/29/2017  . Warfarin anticoagulation     Social History:  Social History   Socioeconomic History  . Marital status: Married    Spouse name: Not on file  . Number of  children: Not on file  . Years of education: Not on file  . Highest education level: Not on file  Occupational History  . Not on file  Social Needs  . Financial resource strain: Not on file  . Food insecurity    Worry: Not on file    Inability: Not on file  . Transportation needs    Medical: Not on file    Non-medical: Not on file  Tobacco Use  . Smoking status: Never Smoker  . Smokeless tobacco: Never Used  Substance and Sexual Activity   . Alcohol use: No    Alcohol/week: 0.0 standard drinks  . Drug use: No  . Sexual activity: Not on file  Lifestyle  . Physical activity    Days per week: Not on file    Minutes per session: Not on file  . Stress: Not on file  Relationships  . Social Herbalist on phone: Not on file    Gets together: Not on file    Attends religious service: Not on file    Active member of club or organization: Not on file    Attends meetings of clubs or organizations: Not on file    Relationship status: Not on file  . Intimate partner violence    Fear of current or ex partner: Not on file    Emotionally abused: Not on file    Physically abused: Not on file    Forced sexual activity: Not on file  Other Topics Concern  . Not on file  Social History Narrative  . Not on file    Medications:   Current Outpatient Medications on File Prior to Visit  Medication Sig Dispense Refill  . allopurinol (ZYLOPRIM) 100 MG tablet Take 100 mg by mouth Daily.     . clonazePAM (KLONOPIN) 0.5 MG tablet Take 0.5 mg by mouth daily.     . colchicine 0.6 MG tablet Take 0.6 mg by mouth daily.    . famotidine (PEPCID) 20 MG tablet Take 20 mg by mouth 2 (two) times daily.    . fish oil-omega-3 fatty acids 1000 MG capsule Take 1 g by mouth 2 (two) times daily.      Marland Kitchen HUMULIN 70/30 (70-30) 100 UNIT/ML injection Inject 30 Units into the skin 2 (two) times daily with a meal.     . isosorbide mononitrate (IMDUR) 60 MG 24 hr tablet Take 120 mg by mouth daily.    Marland Kitchen LISINOPRIL PO Take 10 mg by mouth.    . metFORMIN (GLUCOPHAGE) 1000 MG tablet Take 1,000 mg by mouth 2 (two) times daily with a meal.      . metoprolol succinate (TOPROL-XL) 25 MG 24 hr tablet Take 0.5 tablets by mouth Daily.    . nitroGLYCERIN (NITROSTAT) 0.4 MG SL tablet Place 1 tablet (0.4 mg total) under the tongue every 5 (five) minutes x 3 doses as needed. 25 tablet 3  . ONETOUCH VERIO test strip     . sertraline (ZOLOFT) 50 MG tablet Take 50 mg by  mouth daily. Take 1  tab daily    . simvastatin (ZOCOR) 40 MG tablet Take 40 mg by mouth at bedtime.      . Tamsulosin HCl (FLOMAX PO) Take 20 mg by mouth.    . warfarin (COUMADIN) 5 MG tablet Take 5-7.5 mg by mouth daily. 7.5 mg on Monday and Friday and 5 mg all other days (MANAGED BY PMD)     No current facility-administered medications on  file prior to visit.     Allergies:   Allergies  Allergen Reactions  . Contrast Media [Iodinated Diagnostic Agents]   . Shellfish Allergy Itching and Rash  . Sulfonamide Derivatives Itching and Rash    Physical Exam General: well developed, well nourished elderly Caucasian male, seated, in no evident distress Head: head normocephalic and atraumatic.   Neck: supple with no carotid or supraclavicular bruits Cardiovascular: regular rate and rhythm, no murmurs Musculoskeletal: no deformity Skin:  no rash/petichiae Vascular:  Normal pulses all extremities  Neurologic Exam Mental Status: Awake and fully alert. Oriented to place and time. Recent and remote memory intact. Attention span, concentration and fund of knowledge appropriate. Mood and affect appropriate.  Mini-Mental status exam scored 20/30 with deficits in attention, calculation and recall.  Able to name only 7 animals which walk on 4 legs.  Clock drawing score 1/4.  Geriatric depression scale score 4 and not depressed. Cranial Nerves: Fundoscopic exam reveals sharp disc margins. Pupils equal, briskly reactive to light. Extraocular movements full without nystagmus. Visual fields full to confrontation. Hearing reduced significantly in the right ear.  Facial sensation intact. Face, tongue, palate moves normally and symmetrically.  Motor: Normal bulk and tone. Normal strength in all tested extremity muscles. Sensory.: intact to touch , pinprick , position and vibratory sensation.  Coordination: Rapid alternating movements normal in all extremities. Finger-to-nose and heel-to-shin performed  accurately bilaterally. Gait and Station: Arises from chair without difficulty. Stance is normal. Gait demonstrates normal stride length and balance . Able to heel, toe and tandem walk with mild difficulty.  Reflexes: 1+ and symmetric. Toes downgoing.       ASSESSMENT: 72 year old Caucasian male with what appears to be mild baseline dementia with recent fall head injury and postconcussion worsening of his cognitive difficulties from Alzheimer's     PLAN: I had a long discussion with patient and his wife about his recent fall and concussion and worsening confusion and cognitive difficulties and his baseline dementia which is likely underlying Alzheimer's.  I recommend checking lab work for reversible causes of memory loss, EEG and trial of Namenda for cognitive improvement.  We will avoid Aricept given his already baseline decreased appetite and poor oral intake continue warfarin for stroke prevention given history of atrial fibrillation.  Greater than 50% time during this 45-minute consultation visit was spent on counseling and coordination of care about dementia and concussion and answering questions he will return for follow-up in the future in 2 months or call earlier if necessary. Antony Contras, MD  [facility] [address] [place], [place] [address]  Phone [phone] Fax [phone] Note: This document was prepared with digital dictation and possible smart phrase technology. Any transcriptional errors that result from this process are unintentional.

## 2018-12-06 LAB — DEMENTIA PANEL
Homocysteine: 14.2 umol/L (ref 0.0–19.2)
RPR Ser Ql: NONREACTIVE
TSH: 2.4 u[IU]/mL (ref 0.450–4.500)
Vitamin B-12: 428 pg/mL (ref 232–1245)

## 2018-12-11 ENCOUNTER — Inpatient Hospital Stay (HOSPITAL_BASED_OUTPATIENT_CLINIC_OR_DEPARTMENT_OTHER): Payer: Medicare Other | Admitting: Hematology

## 2018-12-11 ENCOUNTER — Other Ambulatory Visit: Payer: Self-pay

## 2018-12-11 ENCOUNTER — Encounter (HOSPITAL_COMMUNITY): Payer: Self-pay | Admitting: Hematology

## 2018-12-11 VITALS — BP 134/60 | HR 61 | Temp 97.3°F | Resp 18 | Wt 186.7 lb

## 2018-12-11 DIAGNOSIS — Z0000 Encounter for general adult medical examination without abnormal findings: Secondary | ICD-10-CM

## 2018-12-11 DIAGNOSIS — C9000 Multiple myeloma not having achieved remission: Secondary | ICD-10-CM

## 2018-12-11 DIAGNOSIS — D472 Monoclonal gammopathy: Secondary | ICD-10-CM

## 2018-12-11 MED ORDER — INFLUENZA VAC A&B SA ADJ QUAD 0.5 ML IM PRSY: 0.5000 mL | PREFILLED_SYRINGE | Freq: Once | INTRAMUSCULAR | Status: DC

## 2018-12-11 NOTE — Patient Instructions (Addendum)
[facility] Discharge Instructions  You were seen today by Dr. Katragadda. He went over your recent lab results. He will see you back in 3 months for labs and follow up.   Thank you for choosing [facility] to provide your oncology and hematology care.  To afford each patient quality time with our provider, please arrive at least 15 minutes before your scheduled appointment time.   If you have a lab appointment with the Cancer Center please come in thru the  Main Entrance and check in at the main information desk  You need to re-schedule your appointment should you arrive 10 or more minutes late.  We strive to give you quality time with our providers, and arriving late affects you and other patients whose appointments are after yours.  Also, if you no show three or more times for appointments you may be dismissed from the clinic at the providers discretion.     Again, thank you for choosing [facility].  Our hope is that these requests will decrease the amount of time that you wait before being seen by our physicians.       _____________________________________________________________  Should you have questions after your visit to [facility], please contact our office at [phone] between the hours of 8:00 a.m. and 4:30 p.m.  Voicemails left after 4:00 p.m. will not be returned until the following business day.  For prescription refill requests, have your pharmacy contact our office and allow 72 hours.    Cancer Center Support Programs:   > Cancer Support Group  2nd Tuesday of the month 1pm-2pm, Journey Room    

## 2018-12-11 NOTE — Progress Notes (Signed)
Jay Campbell, [place] [address]   CLINIC:  Medical Oncology/Hematology  PCP:  Monico Blitz, Jay Campbell [address] [phone]   REASON FOR VISIT:  Follow-up for smoldering myeloma.  CURRENT THERAPY: Observation    INTERVAL HISTORY:  Jay Campbell 73 y.o. male seen for follow-up of smoldering myeloma.  He reportedly fell and broke his right pelvis end of July.  He was treated at [facility].  He reports pain in the middle of his back for the past month.  Appetite and energy levels are low.  Denies any bleeding per rectum or melena.  Occasional nausea present.  Denies any fevers or infections.    REVIEW OF SYSTEMS:  Review of Systems  Gastrointestinal: Positive for nausea.  Musculoskeletal: Positive for back pain.  Neurological: Positive for dizziness.  Psychiatric/Behavioral: Positive for sleep disturbance.  All other systems reviewed and are negative.    PAST MEDICAL/SURGICAL HISTORY:  Past Medical History:  Diagnosis Date  . Anxiety   . Atrial fibrillation ([place])    Permanent  . Coronary artery disease    Mild nonobstructive 1/12  . DM (diabetes mellitus) ([place])   . GERD (gastroesophageal reflux disease)   . Gout   . Hearing disorder, cochlear   . Hiatal hernia   . HLD (hyperlipidemia)   . HTN (hypertension)   . S/P AV nodal ablation    s/p SJM PPM; gen change 02-01-13 by Dr Rayann Heman  . Smoldering multiple myeloma ([place]) 12/29/2017  . Warfarin anticoagulation    Past Surgical History:  Procedure Laterality Date  . CATARACT EXTRACTION Right   . COCHLEAR IMPLANT Right   . HEMORRHOID BANDING    . KNEE ARTHROSCOPY Right 1992   Dr Luna Glasgow   . PACEMAKER GENERATOR CHANGE Bilateral 02/01/2013   Procedure: PACEMAKER GENERATOR CHANGE;  Surgeon: Coralyn Mark, MD;  Location: [facility] CATH LAB;  Service: Cardiovascular;  Laterality: Bilateral;  . PACEMAKER PLACEMENT  2002; 2014   SJM implanted by Dr Lovena Le with AV nodal  ablation performed; gen change to Accent SR RF by Dr Rayann Heman 02-01-13  . RETINAL LASER PROCEDURE Left   . SLT LASER APPLICATION Left 17/09/1163   Procedure: SLT LASER APPLICATION;  Surgeon: Williams Che, MD;  Location: [facility];  Service: Ophthalmology;  Laterality: Left;     SOCIAL HISTORY:  Social History   Socioeconomic History  . Marital status: Married    Spouse name: Not on file  . Number of children: Not on file  . Years of education: Not on file  . Highest education level: Not on file  Occupational History  . Not on file  Social Needs  . Financial resource strain: Not on file  . Food insecurity    Worry: Not on file    Inability: Not on file  . Transportation needs    Medical: Not on file    Non-medical: Not on file  Tobacco Use  . Smoking status: Never Smoker  . Smokeless tobacco: Never Used  Substance and Sexual Activity  . Alcohol use: No    Alcohol/week: 0.0 standard drinks  . Drug use: No  . Sexual activity: Not on file  Lifestyle  . Physical activity    Days per week: Not on file    Minutes per session: Not on file  . Stress: Not on file  Relationships  . Social Herbalist on phone: Not on file    Gets together:  Not on file    Attends religious service: Not on file    Active member of club or organization: Not on file    Attends meetings of clubs or organizations: Not on file    Relationship status: Not on file  . Intimate partner violence    Fear of current or ex partner: Not on file    Emotionally abused: Not on file    Physically abused: Not on file    Forced sexual activity: Not on file  Other Topics Concern  . Not on file  Social History Narrative  . Not on file    FAMILY HISTORY:  Family History  Problem Relation Age of Onset  . COPD Mother   . Emphysema Mother   . Cancer - Lung Father   . Heart disease Paternal Uncle   . Stomach cancer Neg Hx   . Colon cancer Neg Hx     CURRENT MEDICATIONS:  Outpatient Encounter  Medications as of 12/11/2018  Medication Sig Note  . allopurinol (ZYLOPRIM) 100 MG tablet Take 100 mg by mouth Daily.    . fish oil-omega-3 fatty acids 1000 MG capsule Take 1 g by mouth 2 (two) times daily.     Marland Kitchen HUMULIN 70/30 (70-30) 100 UNIT/ML injection Inject 30 Units into the skin 2 (two) times daily with a meal.    . isosorbide mononitrate (IMDUR) 60 MG 24 hr tablet Take 120 mg by mouth daily.   Marland Kitchen lisinopril (ZESTRIL) 10 MG tablet Take 10 mg by mouth daily.    . memantine (NAMENDA TITRATION PAK) tablet pack 5 mg/day for =1 week; 5 mg twice daily for =1 week; 15 mg/day given in 5 mg and 10 mg separated doses for =1 week; then 10 mg twice daily   . metFORMIN (GLUCOPHAGE) 1000 MG tablet Take 1,000 mg by mouth 2 (two) times daily with a meal.     . metoprolol succinate (TOPROL-XL) 25 MG 24 hr tablet Take 0.5 tablets by mouth Daily.   Glory Rosebush VERIO test strip    . sertraline (ZOLOFT) 50 MG tablet Take 50 mg by mouth daily. Take 1  tab daily   . simvastatin (ZOCOR) 40 MG tablet Take 40 mg by mouth at bedtime.     . Tamsulosin HCl (FLOMAX PO) Take 20 mg by mouth daily.    Marland Kitchen warfarin (COUMADIN) 5 MG tablet Take 5-7.5 mg by mouth daily. 7.5 mg on Monday and Friday and 5 mg all other days (MANAGED BY PMD)   . clonazePAM (KLONOPIN) 0.5 MG tablet Take 0.5 mg by mouth as needed.    . colchicine 0.6 MG tablet Take 0.6 mg by mouth as needed.    . famotidine (PEPCID) 20 MG tablet Take 20 mg by mouth as needed.    . nitroGLYCERIN (NITROSTAT) 0.4 MG SL tablet Place 1 tablet (0.4 mg total) under the tongue every 5 (five) minutes x 3 doses as needed. (Patient not taking: Reported on 12/11/2018) 11/15/2016: On hand   . [DISCONTINUED] LISINOPRIL PO Take 10 mg by mouth.   . [DISCONTINUED] memantine (NAMENDA) 5 MG tablet    . [DISCONTINUED] influenza vaccine adjuvanted (FLUAD) injection 0.5 mL  12/11/2018: left before dose given   No facility-administered encounter medications on file as of 12/11/2018.      ALLERGIES:  Allergies  Allergen Reactions  . Contrast Media [Iodinated Diagnostic Agents]   . Shellfish Allergy Itching and Rash  . Sulfonamide Derivatives Itching and Rash     PHYSICAL EXAM:  ECOG Performance status: 1  Vitals:   12/11/18 1120  BP: 134/60  Pulse: 61  Resp: 18  Temp: (!) 97.3 F (36.3 C)  SpO2: 100%   Filed Weights   12/11/18 1120  Weight: 186 lb 11.2 oz (84.7 kg)    Physical Exam Vitals signs reviewed.  Constitutional:      Appearance: Normal appearance.  Cardiovascular:     Rate and Rhythm: Normal rate and regular rhythm.     Heart sounds: Normal heart sounds.  Pulmonary:     Effort: Pulmonary effort is normal.     Breath sounds: Normal breath sounds.  Abdominal:     General: There is no distension.     Palpations: Abdomen is soft. There is no mass.  Musculoskeletal:        General: No swelling.  Skin:    General: Skin is warm.  Neurological:     General: No focal deficit present.     Mental Status: He is alert and oriented to person, place, and time.  Psychiatric:        Mood and Affect: Mood normal.        Behavior: Behavior normal.    Tenderness in the upper back region present.  LABORATORY DATA:  I have reviewed the labs as listed.  CBC    Component Value Date/Time   WBC 4.4 12/04/2018 1115   RBC 3.44 (L) 12/04/2018 1115   HGB 12.0 (L) 12/04/2018 1115   HCT 36.9 (L) 12/04/2018 1115   HCT 33.4 (L) 03/12/2018 0417   PLT 125 (L) 12/04/2018 1115   MCV 107.3 (H) 12/04/2018 1115   MCH 34.9 (H) 12/04/2018 1115   MCHC 32.5 12/04/2018 1115   RDW 13.2 12/04/2018 1115   LYMPHSABS 0.8 12/04/2018 1115   MONOABS 0.3 12/04/2018 1115   EOSABS 0.1 12/04/2018 1115   BASOSABS 0.0 12/04/2018 1115   CMP Latest Ref Rng & Units 12/04/2018 10/02/2018 08/23/2018  Glucose 70 - 99 mg/dL 298(H) 246(H) 348(H)  BUN 8 - 23 mg/dL 21 29(H) 28(H)  Creatinine 0.61 - 1.24 mg/dL 1.27(H) 1.40(H) 1.33(H)  Sodium 135 - 145 mmol/L 135 136 134(L)  Potassium  3.5 - 5.1 mmol/L 4.9 4.9 4.8  Chloride 98 - 111 mmol/L 103 105 101  CO2 22 - 32 mmol/L '25 23 25  ' Calcium 8.9 - 10.3 mg/dL 9.6 8.7(L) 9.4  Total Protein 6.5 - 8.1 g/dL 7.6 - 8.0  Total Bilirubin 0.3 - 1.2 mg/dL 0.8 - 0.9  Alkaline Phos 38 - 126 U/L 115 - 88  AST 15 - 41 U/L 33 - 28  ALT 0 - 44 U/L 24 - 23       DIAGNOSTIC IMAGING:  I have independently reviewed the scans and discussed with the patient.    ASSESSMENT & PLAN:   Smoldering multiple myeloma (HCC) 1.  IgG kappa smoldering myeloma: -Bone marrow biopsy on 09/05/2017 shows 12% plasma cells. - Bone survey on 08/23/2018 was negative for lytic lesions. - Blood work on 12/04/2018 shows M spike improved to 1.1, previously 1.8 g.  Free light chain ratio increased to 3.17, previously 2.68.  Kappa light chains increased to 65, previously 51.  Hemoglobin is 12.  Creatinine is 1.27.  Calcium is 9.6. - He reported a fall and had fracture of the right pelvis. -However he is complaining of pain in the upper back.  Given his history, I have recommended MRI of the thoracic spine.  However he is a pacemaker.  Will order CT scan  of the thoracic spine without contrast.  He is allergic to IV contrast.  2.  Macrocytic anemia: -MCV is 107.  Hemoglobin is 12.  We checked his B12 and homocystine which were normal.  LDH is normal.  3.  Thrombocytopenia: -He has mild to moderate thrombocytopenia.  Platelets are 125. -Bone marrow biopsy showed normal megakaryocytes.  Most likely etiology is ITP.   Total time spent is 25 minutes with more than 50% of the time spent face-to-face discussing treatment plan, counseling and coordination of care.  Orders placed this encounter:  Orders Placed This Encounter  Procedures  . DG Bone Survey Met  . CT Thoracic Spine Wo Contrast  . Protein electrophoresis, serum  . Kappa/lambda light chains  . Lactate dehydrogenase  . CBC with Differential/Platelet  . Comprehensive metabolic panel      Derek Jack, MD [place] [phone]

## 2018-12-11 NOTE — Assessment & Plan Note (Signed)
1.  IgG kappa smoldering myeloma: -Bone marrow biopsy on 09/05/2017 shows 12% plasma cells. - Bone survey on 08/23/2018 was negative for lytic lesions. - Blood work on 12/04/2018 shows M spike improved to 1.1, previously 1.8 g.  Free light chain ratio increased to 3.17, previously 2.68.  Kappa light chains increased to 65, previously 51.  Hemoglobin is 12.  Creatinine is 1.27.  Calcium is 9.6. - He reported a fall and had fracture of the right pelvis. -However he is complaining of pain in the upper back.  Given his history, I have recommended MRI of the thoracic spine.  However he is a pacemaker.  Will order CT scan of the thoracic spine without contrast.  He is allergic to IV contrast.  2.  Macrocytic anemia: -MCV is 107.  Hemoglobin is 12.  We checked his B12 and homocystine which were normal.  LDH is normal.  3.  Thrombocytopenia: -He has mild to moderate thrombocytopenia.  Platelets are 125. -Bone marrow biopsy showed normal megakaryocytes.  Most likely etiology is ITP.

## 2018-12-18 ENCOUNTER — Ambulatory Visit (HOSPITAL_COMMUNITY)
Admission: RE | Admit: 2018-12-18 | Discharge: 2018-12-18 | Disposition: A | Discharge status: Home / Self Care | Payer: Medicare Other | Source: Ambulatory Visit | Attending: Hematology | Admitting: Hematology

## 2018-12-18 ENCOUNTER — Other Ambulatory Visit: Payer: Self-pay

## 2018-12-18 DIAGNOSIS — C9000 Multiple myeloma not having achieved remission: Secondary | ICD-10-CM | POA: Insufficient documentation

## 2018-12-18 DIAGNOSIS — D472 Monoclonal gammopathy: Secondary | ICD-10-CM

## 2018-12-20 ENCOUNTER — Ambulatory Visit: Payer: Medicare Other | Admitting: Neurology

## 2018-12-20 ENCOUNTER — Inpatient Hospital Stay (HOSPITAL_COMMUNITY): Payer: Medicare Other | Attending: Hematology | Admitting: Hematology

## 2018-12-20 ENCOUNTER — Encounter (HOSPITAL_COMMUNITY): Payer: Self-pay | Admitting: Hematology

## 2018-12-20 ENCOUNTER — Other Ambulatory Visit: Payer: Self-pay

## 2018-12-20 VITALS — BP 121/53 | HR 61 | Temp 97.7°F | Resp 18 | Wt 190.0 lb

## 2018-12-20 DIAGNOSIS — Z8249 Family history of ischemic heart disease and other diseases of the circulatory system: Secondary | ICD-10-CM | POA: Insufficient documentation

## 2018-12-20 DIAGNOSIS — R5383 Other fatigue: Secondary | ICD-10-CM | POA: Diagnosis not present

## 2018-12-20 DIAGNOSIS — M5489 Other dorsalgia: Secondary | ICD-10-CM | POA: Insufficient documentation

## 2018-12-20 DIAGNOSIS — Z836 Family history of other diseases of the respiratory system: Secondary | ICD-10-CM | POA: Insufficient documentation

## 2018-12-20 DIAGNOSIS — Z801 Family history of malignant neoplasm of trachea, bronchus and lung: Secondary | ICD-10-CM | POA: Diagnosis not present

## 2018-12-20 DIAGNOSIS — C9000 Multiple myeloma not having achieved remission: Secondary | ICD-10-CM | POA: Insufficient documentation

## 2018-12-20 DIAGNOSIS — R410 Disorientation, unspecified: Secondary | ICD-10-CM | POA: Diagnosis not present

## 2018-12-20 DIAGNOSIS — Z79899 Other long term (current) drug therapy: Secondary | ICD-10-CM | POA: Diagnosis not present

## 2018-12-20 DIAGNOSIS — D539 Nutritional anemia, unspecified: Secondary | ICD-10-CM | POA: Insufficient documentation

## 2018-12-20 DIAGNOSIS — D696 Thrombocytopenia, unspecified: Secondary | ICD-10-CM | POA: Diagnosis not present

## 2018-12-20 DIAGNOSIS — F0280 Dementia in other diseases classified elsewhere without behavioral disturbance: Secondary | ICD-10-CM

## 2018-12-20 DIAGNOSIS — D472 Monoclonal gammopathy: Secondary | ICD-10-CM

## 2018-12-20 DIAGNOSIS — G301 Alzheimer's disease with late onset: Secondary | ICD-10-CM

## 2018-12-20 NOTE — Patient Instructions (Addendum)
[facility] at [facility] Discharge Instructions  You were seen today by Dr. Katragadda. He went over your recent lab results. He will see you back in 3 months for labs and follow up.   Thank you for choosing [facility] at [facility] to provide your oncology and hematology care.  To afford each patient quality time with our provider, please arrive at least 15 minutes before your scheduled appointment time.   If you have a lab appointment with the Cancer Center please come in thru the  Main Entrance and check in at the main information desk  You need to re-schedule your appointment should you arrive 10 or more minutes late.  We strive to give you quality time with our providers, and arriving late affects you and other patients whose appointments are after yours.  Also, if you no show three or more times for appointments you may be dismissed from the clinic at the providers discretion.     Again, thank you for choosing [facility].  Our hope is that these requests will decrease the amount of time that you wait before being seen by our physicians.       _____________________________________________________________  Should you have questions after your visit to [facility], please contact our office at [phone] between the hours of 8:00 a.m. and 4:30 p.m.  Voicemails left after 4:00 p.m. will not be returned until the following business day.  For prescription refill requests, have your pharmacy contact our office and allow 72 hours.    Cancer Center Support Programs:   > Cancer Support Group  2nd Tuesday of the month 1pm-2pm, Journey Room    

## 2018-12-20 NOTE — Progress Notes (Signed)
Jay Campbell, Jay [place] [address]   CLINIC:  Medical Oncology/Hematology  PCP:  Jay Campbell, [place] [address] [phone]   REASON FOR VISIT:  Follow-up for smoldering myeloma.  CURRENT THERAPY: Observation    INTERVAL HISTORY:  Jay Campbell 73 y.o. male seen for follow-up of smoldering myeloma along with his wife.  He reportedly had right pelvis fracture end of July.  He was treated at [facility] in the hospital.  He reported some upper right-sided back pain at last visit.  The pain is slightly better today.  I have ordered MRI of the thoracic spine which could not be done as he had a pacemaker.  CT scan was done instead.  Reported appetite and energy levels are 25%.  Mild fatigue is stable.  REVIEW OF SYSTEMS:  Review of Systems  Constitutional: Positive for fatigue.  All other systems reviewed and are negative.    PAST MEDICAL/SURGICAL HISTORY:  Past Medical History:  Diagnosis Date   Anxiety    Atrial fibrillation ([place])    Permanent   Coronary artery disease    Mild nonobstructive 1/12   DM (diabetes mellitus) (HCC)    GERD (gastroesophageal reflux disease)    Gout    Hearing disorder, cochlear    Hiatal hernia    HLD (hyperlipidemia)    HTN (hypertension)    S/P AV nodal ablation    s/p SJM PPM; gen change 02-01-13 by Dr Rayann Heman   Smoldering multiple myeloma ([place]) 12/29/2017   Warfarin anticoagulation    Past Surgical History:  Procedure Laterality Date   CATARACT EXTRACTION Right    COCHLEAR IMPLANT Right    HEMORRHOID BANDING     KNEE ARTHROSCOPY Right 1992   Dr Luna Glasgow    PACEMAKER GENERATOR CHANGE Bilateral 02/01/2013   Procedure: PACEMAKER GENERATOR CHANGE;  Surgeon: Coralyn Mark, MD;  Location: [facility] CATH LAB;  Service: Cardiovascular;  Laterality: Bilateral;   PACEMAKER PLACEMENT  2002; 2014   SJM implanted by Dr Lovena Le with AV nodal ablation performed; gen change to Accent SR RF by Dr  Rayann Heman 02-01-13   RETINAL LASER PROCEDURE Left    SLT LASER APPLICATION Left 60/08/3014   Procedure: SLT LASER APPLICATION;  Surgeon: Williams Che, MD;  Location: [facility];  Service: Ophthalmology;  Laterality: Left;     SOCIAL HISTORY:  Social History   Socioeconomic History   Marital status: Married    Spouse name: Not on file   Number of children: Not on file   Years of education: Not on file   Highest education level: Not on file  Occupational History   Not on file  Social Needs   Financial resource strain: Not on file   Food insecurity    Worry: Not on file    Inability: Not on file   Transportation needs    Medical: Not on file    Non-medical: Not on file  Tobacco Use   Smoking status: Never Smoker   Smokeless tobacco: Never Used  Substance and Sexual Activity   Alcohol use: No    Alcohol/week: 0.0 standard drinks   Drug use: No   Sexual activity: Not on file  Lifestyle   Physical activity    Days per week: Not on file    Minutes per session: Not on file   Stress: Not on file  Relationships   Social connections    Talks on phone: Not on file    Gets  together: Not on file    Attends religious service: Not on file    Active member of club or organization: Not on file    Attends meetings of clubs or organizations: Not on file    Relationship status: Not on file   Intimate partner violence    Fear of current or ex partner: Not on file    Emotionally abused: Not on file    Physically abused: Not on file    Forced sexual activity: Not on file  Other Topics Concern   Not on file  Social History Narrative   Not on file    FAMILY HISTORY:  Family History  Problem Relation Age of Onset   COPD Mother    Emphysema Mother    Cancer - Lung Father    Heart disease Paternal Uncle    Stomach cancer Neg Hx    Colon cancer Neg Hx     CURRENT MEDICATIONS:  Outpatient Encounter Medications as of 12/20/2018  Medication Sig Note    allopurinol (ZYLOPRIM) 100 MG tablet Take 100 mg by mouth Daily.     clonazePAM (KLONOPIN) 0.5 MG tablet Take 0.5 mg by mouth as needed.     colchicine 0.6 MG tablet Take 0.6 mg by mouth as needed.     fish oil-omega-3 fatty acids 1000 MG capsule Take 1 g by mouth 2 (two) times daily.      HUMULIN 70/30 (70-30) 100 UNIT/ML injection Inject 30 Units into the skin 2 (two) times daily with a meal.     isosorbide mononitrate (IMDUR) 60 MG 24 hr tablet Take 120 mg by mouth daily.    lisinopril (ZESTRIL) 10 MG tablet Take 10 mg by mouth daily.     memantine (NAMENDA TITRATION PAK) tablet pack 5 mg/day for =1 week; 5 mg twice daily for =1 week; 15 mg/day given in 5 mg and 10 mg separated doses for =1 week; then 10 mg twice daily    metFORMIN (GLUCOPHAGE) 1000 MG tablet Take 1,000 mg by mouth 2 (two) times daily with a meal.      metoprolol succinate (TOPROL-XL) 25 MG 24 hr tablet Take 0.5 tablets by mouth Daily.    ONETOUCH VERIO test strip     sertraline (ZOLOFT) 50 MG tablet Take 50 mg by mouth daily. Take 1  tab daily    simvastatin (ZOCOR) 40 MG tablet Take 40 mg by mouth at bedtime.      Tamsulosin HCl (FLOMAX PO) Take 20 mg by mouth daily.     warfarin (COUMADIN) 5 MG tablet Take 5-7.5 mg by mouth daily. 7.5 mg on Monday and Friday and 5 mg all other days (MANAGED BY PMD)    famotidine (PEPCID) 20 MG tablet Take 20 mg by mouth as needed.     nitroGLYCERIN (NITROSTAT) 0.4 MG SL tablet Place 1 tablet (0.4 mg total) under the tongue every 5 (five) minutes x 3 doses as needed. (Patient not taking: Reported on 12/11/2018) 11/15/2016: On hand    No facility-administered encounter medications on file as of 12/20/2018.     ALLERGIES:  Allergies  Allergen Reactions   Contrast Media [Iodinated Diagnostic Agents]    Shellfish Allergy Itching and Rash   Sulfonamide Derivatives Itching and Rash     PHYSICAL EXAM:  ECOG Performance status: 1  Vitals:   12/20/18 1534  BP: (!)  121/53  Pulse: 61  Resp: 18  Temp: 97.7 F (36.5 C)  SpO2: 98%   Filed Weights  12/20/18 1534  Weight: 190 lb (86.2 kg)    Physical Exam Vitals signs reviewed.  Constitutional:      Appearance: Normal appearance.  Cardiovascular:     Rate and Rhythm: Normal rate and regular rhythm.     Heart sounds: Normal heart sounds.  Pulmonary:     Effort: Pulmonary effort is normal.     Breath sounds: Normal breath sounds.  Abdominal:     General: There is no distension.     Palpations: Abdomen is soft. There is no mass.  Musculoskeletal:        General: No swelling.  Skin:    General: Skin is warm.  Neurological:     General: No focal deficit present.     Mental Status: He is alert and oriented to person, place, and time.  Psychiatric:        Mood and Affect: Mood normal.        Behavior: Behavior normal.    Tenderness in the upper back region present.  LABORATORY DATA:  I have reviewed the labs as listed.  CBC    Component Value Date/Time   WBC 4.4 12/04/2018 1115   RBC 3.44 (L) 12/04/2018 1115   HGB 12.0 (L) 12/04/2018 1115   HCT 36.9 (L) 12/04/2018 1115   HCT 33.4 (L) 03/12/2018 0417   PLT 125 (L) 12/04/2018 1115   MCV 107.3 (H) 12/04/2018 1115   MCH 34.9 (H) 12/04/2018 1115   MCHC 32.5 12/04/2018 1115   RDW 13.2 12/04/2018 1115   LYMPHSABS 0.8 12/04/2018 1115   MONOABS 0.3 12/04/2018 1115   EOSABS 0.1 12/04/2018 1115   BASOSABS 0.0 12/04/2018 1115   CMP Latest Ref Rng & Units 12/04/2018 10/02/2018 08/23/2018  Glucose 70 - 99 mg/dL 298(H) 246(H) 348(H)  BUN 8 - 23 mg/dL 21 29(H) 28(H)  Creatinine 0.61 - 1.24 mg/dL 1.27(H) 1.40(H) 1.33(H)  Sodium 135 - 145 mmol/L 135 136 134(L)  Potassium 3.5 - 5.1 mmol/L 4.9 4.9 4.8  Chloride 98 - 111 mmol/L 103 105 101  CO2 22 - 32 mmol/L _0 Calcium 8.9 - 10.3 mg/dL 9.6 8.7(L) 9.4  Total Protein 6.5 - 8.1 g/dL 7.6 - 8.0  Total Bilirubin 0.3 - 1.2 mg/dL 0.8 - 0.9  Alkaline Phos 38 - 126 U/L 115 - 88  AST 15 - 41 U/L  33 - 28  ALT 0 - 44 U/L 24 - 23       DIAGNOSTIC IMAGING:  I have independently reviewed the scans and discussed with the patient.    ASSESSMENT & PLAN:   Smoldering multiple myeloma (HCC) 1.  IgG kappa smoldering myeloma: -Bone marrow biopsy on 09/05/2017 shows 12% plasma cells. -Bone survey on 08/23/2018 was negative for lytic lesions. - Blood work on 12/04/2018 showed M spike 1.1 g, previously 1.8 g.  Free light chain ratio increased to 3.17, previously 2.68.  Kappa light chains increased to 65, previously 51.  Hemoglobin is 12.  Creatinine is 1.27 and calcium is 9.6. - He had a fall and had a fracture of the right pelvis in July. -He reported upper back pain on the right side at last visit.  I have done a CT scan of the thoracic spine. -We reviewed CT thoracic spine from 12/18/2018 which did not show any compression fractures or deformities.  Mild sclerosis involving T7 vertebral body which could be a healing/healed fracture. -I have recommended close follow-up in 3 months with repeat labs including SPEP and free light chains. -  If there is any significant change in the labs, will consider PET scan.  2.  Macrocytic anemia: - MCV is 107.  Hemoglobin is 12.  B12 and homocysteine were normal.  LDH was normal.  3.  Thrombocytopenia: -He had mild to moderate thrombocytopenia.  Latest platelet count is 125. -Bone marrow biopsy showed normal megakaryocytes.  Most likely etiology is ITP.   Total time spent is 25 minutes with more than 50% of the time spent face-to-face discussing treatment plan, counseling and coordination of care.  Orders placed this encounter:  Orders Placed This Encounter  Procedures   CBC with Differential/Platelet   Comprehensive metabolic panel   Lactate dehydrogenase   Protein electrophoresis, serum   Kappa/lambda light chains      Derek Jack, MD [place] [phone]

## 2018-12-20 NOTE — Assessment & Plan Note (Signed)
1.  IgG kappa smoldering myeloma: -Bone marrow biopsy on 09/05/2017 shows 12% plasma cells. -Bone survey on 08/23/2018 was negative for lytic lesions. - Blood work on 12/04/2018 showed M spike 1.1 g, previously 1.8 g.  Free light chain ratio increased to 3.17, previously 2.68.  Kappa light chains increased to 65, previously 51.  Hemoglobin is 12.  Creatinine is 1.27 and calcium is 9.6. - He had a fall and had a fracture of the right pelvis in July. -He reported upper back pain on the right side at last visit.  I have done a CT scan of the thoracic spine. -We reviewed CT thoracic spine from 12/18/2018 which did not show any compression fractures or deformities.  Mild sclerosis involving T7 vertebral body which could be a healing/healed fracture. -I have recommended close follow-up in 3 months with repeat labs including SPEP and free light chains. -If there is any significant change in the labs, will consider PET scan.  2.  Macrocytic anemia: - MCV is 107.  Hemoglobin is 12.  B12 and homocysteine were normal.  LDH was normal.  3.  Thrombocytopenia: -He had mild to moderate thrombocytopenia.  Latest platelet count is 125. -Bone marrow biopsy showed normal megakaryocytes.  Most likely etiology is ITP.

## 2018-12-24 ENCOUNTER — Telehealth: Payer: Self-pay

## 2018-12-24 NOTE — Telephone Encounter (Signed)
-----   Message from Garvin Fila, MD sent at 12/21/2018  9:08 AM EDT ----- Jay Campbell inform the patient that EEG study shows mild generalized slowing of the brain activity which is compatible with his diagnosis of memory loss and dementia.  No seizure activity noted.

## 2018-12-24 NOTE — Telephone Encounter (Signed)
I called wife that labs in the dementia panel was normal. PT verbalized understanding.

## 2018-12-24 NOTE — Telephone Encounter (Signed)
  I called patients wife  that the EEG showed mild generalized slowing of the brain activity which is compatible with his diagnosis of memory loss and dementia. No seizure activity noted. The wife wrote down the EEG findings. She verbalized understanding.

## 2019-02-11 ENCOUNTER — Telehealth: Payer: Self-pay

## 2019-02-11 ENCOUNTER — Telehealth: Payer: Medicare Other | Admitting: Neurology

## 2019-02-11 NOTE — Telephone Encounter (Signed)
I called pts wife about signing onto mychart. PT call phone room and change it to video due to the heavy rain. Pts wife stated she was the video but kept getting pop [place] on her ipad. The pts wife does have mychart account for pt. I advise the wife to all mychart IT to discuss her technical issues. I gave her the number for IT mychart. I advise pts wife to call back to r/s. She verbalized understanding.

## 2019-02-19 ENCOUNTER — Telehealth: Payer: Self-pay | Admitting: Cardiology

## 2019-02-19 NOTE — Telephone Encounter (Signed)

## 2019-02-21 ENCOUNTER — Encounter: Payer: Self-pay | Admitting: Cardiology

## 2019-02-21 ENCOUNTER — Ambulatory Visit: Payer: Medicare Other | Admitting: Family Medicine

## 2019-02-21 ENCOUNTER — Other Ambulatory Visit: Payer: Self-pay

## 2019-02-21 VITALS — BP 144/73 | HR 60 | Ht 72.0 in | Wt 203.0 lb

## 2019-02-21 DIAGNOSIS — E782 Mixed hyperlipidemia: Secondary | ICD-10-CM | POA: Diagnosis not present

## 2019-02-21 DIAGNOSIS — R42 Dizziness and giddiness: Secondary | ICD-10-CM

## 2019-02-21 DIAGNOSIS — I4821 Permanent atrial fibrillation: Secondary | ICD-10-CM | POA: Diagnosis not present

## 2019-02-21 DIAGNOSIS — Z950 Presence of cardiac pacemaker: Secondary | ICD-10-CM

## 2019-02-21 DIAGNOSIS — I10 Essential (primary) hypertension: Secondary | ICD-10-CM

## 2019-02-21 NOTE — Progress Notes (Addendum)
Cardiology Office Note  Date: 02/22/2019   ID: Jay Campbell, DOB 26-Sep-1945, MRN [id number]  PCP:  Monico Blitz, MD  Cardiologist:  Rozann Lesches, MD Electrophysiologist:  None   Chief Complaint  Patient presents with  . Follow-up    History of Present Illness: Jay Campbell is a 73 y.o. male last seen in January 2020 for hospital follow-up for confusion/altered mental status which resolved without any evidence of CVA on head CT.  Follow-up was with [place] in [place].  History of permanent A. fib status post AV node ablation with [facility] pacemaker placement.  Nonobstructive CAD by prior catheterization and low risk nuclear stress test in 2016.  Other history includes hypertension, hyperlipidemia, insulin-dependent diabetes mellitus, stage II-III chronic kidney disease and multiple myeloma.  He had a subsequent electrophysiology follow-up in January on the 31st 2020 with Dr. Rayann Heman. He had a remote device check on January 15, 2019 which showed a normal functioning pacemaker.  EKG today shows ventricular paced rhythm with a rate of 62.  Wife states patient had a fall in July injuring his neck and states the impact dislodged his cochlear implant.  She states leading up to the fall the patient was having issues with not eating or drinking with approximate 20 pound weight loss and altered mental status.  Head CT showed no evidence of acute intracranial or cervical spine injury.  Patient stayed in rehab from July 21st until the 28th where he was discharged.    He continues with memory loss.  He is responding appropriately to questions but cannot remember dates or medications.  He continues to complain of dizziness when going from a lying to sitting and sitting to standing position.  He denies any sensation of near syncope or syncopal episodes other than previously mentioned fall.  Patient was unsure of loss of consciousness during that episode.  He has seen [facility] and had an EEG recently.  Medications reviewed with wife and patient.  Patient has some memory issues.  Wife is primary historian    Past Medical History:  Diagnosis Date  . Anxiety   . Atrial fibrillation ([place])    Permanent  . Coronary artery disease    Mild nonobstructive 1/12  . DM (diabetes mellitus) ([place])   . GERD (gastroesophageal reflux disease)   . Gout   . Hearing disorder, cochlear   . Hiatal hernia   . HLD (hyperlipidemia)   . HTN (hypertension)   . S/P AV nodal ablation    s/p SJM PPM; gen change 02-01-13 by Dr Rayann Heman  . 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 Warfarin anticoagulation     Past Surgical History:  Procedure Laterality Date  . CATARACT EXTRACTION Right   . COCHLEAR IMPLANT Right   . HEMORRHOID BANDING    . KNEE ARTHROSCOPY Right 1992   Dr Luna Glasgow   . PACEMAKER GENERATOR CHANGE Bilateral 02/01/2013   Procedure: PACEMAKER GENERATOR CHANGE;  Surgeon: Coralyn Mark, MD;  Location: [place] CATH LAB;  Service: Cardiovascular;  Laterality: Bilateral;  . PACEMAKER PLACEMENT  2002; 2014   SJM implanted by Dr Lovena Le with AV nodal ablation performed; gen change to [employer]ent SR RF by Dr Rayann Heman 02-01-13  . RETINAL LASER PROCEDURE Left   . SLT LASER APPLICATION Left 06/19/8889   Procedure: SLT LASER APPLICATION;  Surgeon: Williams Che, MD;  Location: [facility];  Service: Ophthalmology;  Laterality: Left;    Current Outpatient Medications  Medication Sig Dispense  Refill  . allopurinol (ZYLOPRIM) 100 MG tablet Take 100 mg by mouth Daily.     . clonazePAM (KLONOPIN) 0.5 MG tablet Take 0.5 mg by mouth as needed.     . colchicine 0.6 MG tablet Take 0.6 mg by mouth as needed.     . famotidine (PEPCID) 20 MG tablet Take 20 mg by mouth as needed.     . fish oil-omega-3 fatty acids 1000 MG capsule Take 1 g by mouth 2 (two) times daily.      Marland Kitchen HUMULIN 70/30 (70-30) 100 UNIT/ML injection Inject 30 Units into the skin 2 (two) times daily with a  meal.     . isosorbide mononitrate (IMDUR) 60 MG 24 hr tablet Take 120 mg by mouth daily.    Marland Kitchen lisinopril (ZESTRIL) 10 MG tablet Take 10 mg by mouth every evening.     . memantine (NAMENDA TITRATION PAK) tablet pack 5 mg/day for =1 week; 5 mg twice daily for =1 week; 15 mg/day given in 5 mg and 10 mg separated doses for =1 week; then 10 mg twice daily 49 tablet 12  . metFORMIN (GLUCOPHAGE) 1000 MG tablet Take 1,000 mg by mouth 2 (two) times daily with a meal.      . metoprolol succinate (TOPROL-XL) 25 MG 24 hr tablet Take 0.5 tablets by mouth Daily.    . nitroGLYCERIN (NITROSTAT) 0.4 MG SL tablet Place 1 tablet (0.4 mg total) under the tongue every 5 (five) minutes x 3 doses as needed. 25 tablet 3  . ONETOUCH VERIO test strip     . sertraline (ZOLOFT) 50 MG tablet Take 50 mg by mouth daily. Take 1  tab daily    . simvastatin (ZOCOR) 40 MG tablet Take 40 mg by mouth at bedtime.      . Tamsulosin HCl (FLOMAX PO) Take 20 mg by mouth daily.     Marland Kitchen warfarin (COUMADIN) 5 MG tablet Take 5-7.5 mg by mouth daily. 7.5 mg on Monday and Friday and 5 mg all other days (MANAGED BY PMD)     No current facility-administered medications for this visit.   Allergies:  Contrast media [iodinated diagnostic agents], Shellfish allergy, and Sulfonamide derivatives   Social History: The patient  reports that he has never smoked. He has never used smokeless tobacco. He reports that he does not drink alcohol or use drugs.   Family History: The patient's family history includes COPD in his mother; Cancer - Lung in his father; Emphysema in his mother; Heart disease in his paternal uncle.   ROS:  Please see the history of present illness. Otherwise, complete review of systems is positive for none.  All other systems are reviewed and negative.   Physical Exam: VS:  BP (!) 144/73 (BP Location: Left Arm, Cuff Size: Normal)   Pulse 60   Ht 6' (1.829 m)   Wt 203 lb (92.1 kg)   SpO2 99%   BMI 27.53 kg/m , BMI Body mass  index is 27.53 kg/m.  Wt Readings from Last 3 Encounters:  02/21/19 203 lb (92.1 kg)  12/20/18 190 lb (86.2 kg)  12/11/18 186 lb 11.2 oz (84.7 kg)    General: Patient appears comfortable at rest. Neck: Supple, no elevated JVP or carotid bruits, no thyromegaly. Lungs: Clear to auscultation, nonlabored breathing at rest. Cardiac: Regular rate and rhythm, no S3 or significant systolic murmur, no pericardial rub. Abdomen: Soft, nontender, no hepatomegaly, bowel sounds present, no guarding or rebound. Extremities: No pitting edema, distal pulses  2+. Skin: Warm and dry. Neuropsychiatric: Alert and oriented x3, affect grossly appropriate.  ECG:  An ECG dated February 21, 2019 was personally reviewed today and demonstrated:  Electronic ventricular paced rhythm rate of 62  Recent Labwork: 12/04/2018: ALT 24; AST 33; BUN 21; Creatinine, Ser 1.27; Hemoglobin 12.0; Platelets 125; Potassium 4.9; Sodium 135 12/05/2018: TSH 2.400     Component Value Date/Time   CHOL 108 03/12/2018 0417   TRIG 71 03/12/2018 0417   HDL 35 (L) 03/12/2018 0417   CHOLHDL 3.1 03/12/2018 0417   VLDL 14 03/12/2018 0417   LDLCALC 59 03/12/2018 0417    Other Studies Reviewed Today:   Assessment and Plan:  1. Permanent atrial fibrillation (HCC)   2. Cardiac pacemaker in situ   3. Mixed hyperlipidemia   4. Dizziness, nonspecific   5. Essential hypertension     1. Permanent atrial fibrillation (HCC) Atrial fibrillation on rate control and anticoagulation.    EKG shows electronic ventricular pacemaker rate of 62.  Continue Toprol XL 25 mg 1/2 tablet daily and Coumadin dosing as directed.  2. Cardiac pacemaker in situ Merlin pacemaker last remote check February 2020.  Patient was scheduled for recheck in May 2020.  Patient states his communication device connected to his phone is broken and needs a replacement.  We will call device clinic to help coordinate replacement of remote device.  3. Mixed hyperlipidemia On  simvastatin 40 mg daily.  Last LDL 59 December 2019  4. Dizziness, nonspecific Patient complaining of dizziness going from lying to sitting and sitting to standing.  Orthostatic vital signs were checked and there were no significant orthostatic changes.  Advised patient to start taking his lisinopril in the evenings versus a.m. dosing.  Medication Adjustments/Labs and Tests Ordered: Current medicines are reviewed at length with the patient today.  Concerns regarding medicines are outlined above.    Patient Instructions  Medication Instructions:    Your physician recommends that you continue on your current medications as directed. Please refer to the Current Medication list given to you today.  Please take your lisinopril in the evening  Labwork:  NONE  Testing/Procedures:  NONE  Follow-Up:  Your physician recommends that you schedule a follow-up appointment in: 1 year (office). You will receive a reminder letter in the mail in about 10 months reminding you to call and schedule your appointment. If you don't receive this letter, please contact our office.  Any Other Special Instructions Will Be Listed Below (If Applicable).  If you need a refill on your cardiac medications before your next appointment, please call your pharmacy.        Signed, Levell July, NP 02/22/2019 10:55 AM    [facility], [place], [place] [address] Phone: [phone]; Fax: [phone]

## 2019-02-21 NOTE — Patient Instructions (Addendum)
Medication Instructions:    Your physician recommends that you continue on your current medications as directed. Please refer to the Current Medication list given to you today.  Please take your lisinopril in the evening  Labwork:  NONE  Testing/Procedures:  NONE  Follow-Up:  Your physician recommends that you schedule a follow-up appointment in: 1 year (office). You will receive a reminder letter in the mail in about 10 months reminding you to call and schedule your appointment. If you don't receive this letter, please contact our office.  Any Other Special Instructions Will Be Listed Below (If Applicable).  If you need a refill on your cardiac medications before your next appointment, please call your pharmacy.

## 2019-02-22 ENCOUNTER — Telehealth: Payer: Self-pay

## 2019-02-22 NOTE — Telephone Encounter (Signed)
I gave the pt wife the number to [employer] to get additional help with his monitor. The pt wife also had questions about his CPAP machine.

## 2019-02-22 NOTE — Telephone Encounter (Signed)
-----   Message from Merlene Laughter, RN sent at 02/21/2019  4:23 PM EST ----- Regarding: Please contact this patient about his home monitor for his device Seen in office today and says he has a problem with his home equipment and sending in transmissions. Please reach out to them

## 2019-03-21 ENCOUNTER — Inpatient Hospital Stay (HOSPITAL_COMMUNITY): Payer: Medicare Other | Attending: Hematology

## 2019-03-21 ENCOUNTER — Other Ambulatory Visit: Payer: Self-pay

## 2019-03-21 DIAGNOSIS — D472 Monoclonal gammopathy: Secondary | ICD-10-CM

## 2019-03-21 DIAGNOSIS — C9000 Multiple myeloma not having achieved remission: Secondary | ICD-10-CM | POA: Diagnosis not present

## 2019-03-21 LAB — COMPREHENSIVE METABOLIC PANEL
ALT: 22 U/L (ref 0–44)
AST: 27 U/L (ref 15–41)
Albumin: 3.9 g/dL (ref 3.5–5.0)
Alkaline Phosphatase: 90 U/L (ref 38–126)
Anion gap: 8 (ref 5–15)
BUN: 31 mg/dL — ABNORMAL HIGH (ref 8–23)
CO2: 24 mmol/L (ref 22–32)
Calcium: 9.2 mg/dL (ref 8.9–10.3)
Chloride: 104 mmol/L (ref 98–111)
Creatinine, Ser: 1.21 mg/dL (ref 0.61–1.24)
GFR calc Af Amer: >60 mL/min (ref >60)
GFR calc non Af Amer: 58 mL/min — ABNORMAL LOW (ref >60)
Glucose, Bld: 213 mg/dL — ABNORMAL HIGH (ref 70–99)
Potassium: 4.6 mmol/L (ref 3.5–5.1)
Sodium: 136 mmol/L (ref 135–145)
Total Bilirubin: 1.1 mg/dL (ref 0.3–1.2)
Total Protein: 7.9 g/dL (ref 6.5–8.1)

## 2019-03-21 LAB — CBC WITH DIFFERENTIAL/PLATELET
Abs Immature Granulocytes: 0.02 10*3/uL (ref 0.00–0.07)
Basophils Absolute: 0 10*3/uL (ref 0.0–0.1)
Basophils Relative: 1 %
Eosinophils Absolute: 0.1 10*3/uL (ref 0.0–0.5)
Eosinophils Relative: 1 %
HCT: 36.8 % — ABNORMAL LOW (ref 39.0–52.0)
Hemoglobin: 12.6 g/dL — ABNORMAL LOW (ref 13.0–17.0)
Immature Granulocytes: 1 %
Lymphocytes Relative: 24 %
Lymphs Abs: 1 10*3/uL (ref 0.7–4.0)
MCH: 35.6 pg — ABNORMAL HIGH (ref 26.0–34.0)
MCHC: 34.2 g/dL (ref 30.0–36.0)
MCV: 104 fL — ABNORMAL HIGH (ref 80.0–100.0)
Monocytes Absolute: 0.3 10*3/uL (ref 0.1–1.0)
Monocytes Relative: 7 %
Neutro Abs: 2.7 10*3/uL (ref 1.7–7.7)
Neutrophils Relative %: 66 %
Platelets: 95 10*3/uL — ABNORMAL LOW (ref 150–400)
RBC: 3.54 MIL/uL — ABNORMAL LOW (ref 4.22–5.81)
RDW: 12.1 % (ref 11.5–15.5)
WBC: 4 10*3/uL (ref 4.0–10.5)
nRBC: 0 % (ref 0.0–0.2)

## 2019-03-21 LAB — LACTATE DEHYDROGENASE: LDH: 115 U/L (ref 98–192)

## 2019-03-22 LAB — KAPPA/LAMBDA LIGHT CHAINS
Kappa free light chain: 64.4 mg/L — ABNORMAL HIGH (ref 3.3–19.4)
Kappa, lambda light chain ratio: 3.11 — ABNORMAL HIGH (ref 0.26–1.65)
Lambda free light chains: 20.7 mg/L (ref 5.7–26.3)

## 2019-03-25 ENCOUNTER — Telehealth: Payer: Self-pay | Admitting: Internal Medicine

## 2019-03-25 NOTE — Telephone Encounter (Signed)
°  1. Has your device fired?   2. Is you device beeping?   3. Are you experiencing draining or swelling at device site?   4. Are you calling to see if we received your device transmission?   5. Have you passed out?   Patient's wife called about he transmission box, she states they recently pulled it back in and it just made a beeping sound.  She would like some assistance with it.  They unplugged it again.     Please route to [place]

## 2019-03-26 NOTE — Telephone Encounter (Signed)
I gave Jay Campbell the number to [employer] tech support to get additional help with the monitor. The pt was not home for me to trouble shoot the monitor.

## 2019-03-28 ENCOUNTER — Encounter (HOSPITAL_COMMUNITY): Payer: Self-pay | Admitting: Hematology

## 2019-03-28 ENCOUNTER — Other Ambulatory Visit: Payer: Self-pay

## 2019-03-28 ENCOUNTER — Inpatient Hospital Stay (HOSPITAL_BASED_OUTPATIENT_CLINIC_OR_DEPARTMENT_OTHER): Payer: Medicare Other | Admitting: Hematology

## 2019-03-28 DIAGNOSIS — C9000 Multiple myeloma not having achieved remission: Secondary | ICD-10-CM | POA: Diagnosis not present

## 2019-03-28 DIAGNOSIS — D472 Monoclonal gammopathy: Secondary | ICD-10-CM

## 2019-03-28 NOTE — Progress Notes (Signed)
Virtual Visit via Telephone Note  I connected with Jay Campbell on 03/28/19 at  2:35 PM EST by telephone and verified that I am speaking with the correct person using two identifiers.   I discussed the limitations, risks, security and privacy concerns of performing an evaluation and management service by telephone and the availability of in person appointments. I also discussed with the patient that there may be a patient responsible charge related to this service. The patient expressed understanding and agreed to proceed.   History of Present Illness: He is being followed in the clinic for IgG kappa smoldering myeloma, macrocytic anemia and thrombocytopenia.   Observations/Objective: He reports that he is having some trouble with his heart.  He reports chest pressure on and off.  Denies any new onset bone pains.  Denies any fevers, night sweats or weight loss in the last 3 months.  No bleeding per rectum or melena.  Assessment and Plan:  1.  IgG kappa smoldering myeloma: -We reviewed his blood work.  Hemoglobin is 12.6.  Creatinine is 1.21.  Calcium is 9.2. -Kappa free light chains are 64.4 and lambda light chains are 20.7.  Ratio is 3.11. -SPEP is pending.  We will reach out to the patient if it is significantly changed. -Otherwise I have recommended follow-up in 3 months with repeat labs.  2.  Thrombocytopenia: -Platelet count is 95.  He has mild to moderate thrombocytopenia. -Bone marrow biopsy showed normal megakaryocytes.  Most likely etiology is ITP.  3.  Macrocytic anemia: -Hemoglobin is 12.6 with MCV of 104.  Previous work-up including A45, folic acid were normal.   Follow Up Instructions: RTC 3 months with labs.   I discussed the assessment and treatment plan with the patient. The patient was provided an opportunity to ask questions and all were answered. The patient agreed with the plan and demonstrated an understanding of the instructions.   The patient was advised  to call back or seek an in-person evaluation if the symptoms worsen or if the condition fails to improve as anticipated.  I provided 11 minutes of non-face-to-face time during this encounter.   Derek Jack, MD

## 2019-03-29 LAB — PROTEIN ELECTROPHORESIS, SERUM
A/G Ratio: 1.1 (ref 0.7–1.7)
Albumin ELP: 4.2 g/dL (ref 2.9–4.4)
Alpha-1-Globulin: 0.2 g/dL (ref 0.0–0.4)
Alpha-2-Globulin: 0.7 g/dL (ref 0.4–1.0)
Beta Globulin: 0.9 g/dL (ref 0.7–1.3)
Gamma Globulin: 2.2 g/dL — ABNORMAL HIGH (ref 0.4–1.8)
Globulin, Total: 4 g/dL — ABNORMAL HIGH (ref 2.2–3.9)
M-Spike, %: 1.3 g/dL — ABNORMAL HIGH
Total Protein ELP: 8.2 g/dL (ref 6.0–8.5)

## 2019-04-02 DIAGNOSIS — Z299 Encounter for prophylactic measures, unspecified: Secondary | ICD-10-CM | POA: Diagnosis not present

## 2019-04-02 DIAGNOSIS — R002 Palpitations: Secondary | ICD-10-CM | POA: Diagnosis not present

## 2019-04-02 DIAGNOSIS — E1165 Type 2 diabetes mellitus with hyperglycemia: Secondary | ICD-10-CM | POA: Diagnosis not present

## 2019-04-02 DIAGNOSIS — I4891 Unspecified atrial fibrillation: Secondary | ICD-10-CM | POA: Diagnosis not present

## 2019-04-02 DIAGNOSIS — Z789 Other specified health status: Secondary | ICD-10-CM | POA: Diagnosis not present

## 2019-04-05 DIAGNOSIS — G4733 Obstructive sleep apnea (adult) (pediatric): Secondary | ICD-10-CM | POA: Diagnosis not present

## 2019-04-09 ENCOUNTER — Ambulatory Visit: Payer: Medicare Other | Attending: Internal Medicine

## 2019-04-09 ENCOUNTER — Other Ambulatory Visit: Payer: Self-pay

## 2019-04-09 DIAGNOSIS — Z20822 Contact with and (suspected) exposure to covid-19: Secondary | ICD-10-CM

## 2019-04-10 LAB — NOVEL CORONAVIRUS, NAA: SARS-CoV-2, NAA: NOT DETECTED

## 2019-04-19 ENCOUNTER — Other Ambulatory Visit: Payer: Self-pay

## 2019-04-19 ENCOUNTER — Ambulatory Visit: Payer: Medicare Other | Admitting: Internal Medicine

## 2019-04-19 ENCOUNTER — Telehealth: Payer: Self-pay | Admitting: Internal Medicine

## 2019-04-19 ENCOUNTER — Encounter: Payer: Self-pay | Admitting: Internal Medicine

## 2019-04-19 VITALS — BP 145/78 | HR 60 | Ht 72.0 in | Wt 190.8 lb

## 2019-04-19 DIAGNOSIS — Z950 Presence of cardiac pacemaker: Secondary | ICD-10-CM | POA: Diagnosis not present

## 2019-04-19 DIAGNOSIS — I442 Atrioventricular block, complete: Secondary | ICD-10-CM

## 2019-04-19 DIAGNOSIS — F329 Major depressive disorder, single episode, unspecified: Secondary | ICD-10-CM

## 2019-04-19 DIAGNOSIS — R002 Palpitations: Secondary | ICD-10-CM | POA: Diagnosis not present

## 2019-04-19 DIAGNOSIS — I10 Essential (primary) hypertension: Secondary | ICD-10-CM

## 2019-04-19 DIAGNOSIS — R0602 Shortness of breath: Secondary | ICD-10-CM

## 2019-04-19 DIAGNOSIS — Z299 Encounter for prophylactic measures, unspecified: Secondary | ICD-10-CM | POA: Diagnosis not present

## 2019-04-19 DIAGNOSIS — F32A Depression, unspecified: Secondary | ICD-10-CM

## 2019-04-19 DIAGNOSIS — E1122 Type 2 diabetes mellitus with diabetic chronic kidney disease: Secondary | ICD-10-CM | POA: Diagnosis not present

## 2019-04-19 DIAGNOSIS — Z789 Other specified health status: Secondary | ICD-10-CM | POA: Diagnosis not present

## 2019-04-19 DIAGNOSIS — G4730 Sleep apnea, unspecified: Secondary | ICD-10-CM | POA: Diagnosis not present

## 2019-04-19 DIAGNOSIS — E1165 Type 2 diabetes mellitus with hyperglycemia: Secondary | ICD-10-CM | POA: Diagnosis not present

## 2019-04-19 DIAGNOSIS — I4821 Permanent atrial fibrillation: Secondary | ICD-10-CM

## 2019-04-19 DIAGNOSIS — N183 Chronic kidney disease, stage 3 unspecified: Secondary | ICD-10-CM | POA: Diagnosis not present

## 2019-04-19 LAB — CUP PACEART INCLINIC DEVICE CHECK
Battery Remaining Longevity: 136 mo
Battery Voltage: 3.01 V
Brady Statistic RV Percent Paced: 99.99 %
Date Time Interrogation Session: 20210205112529
Implantable Lead Implant Date: 20020214190000
Implantable Lead Location: 753860
Implantable Pulse Generator Implant Date: 20141120190000
Lead Channel Impedance Value: 600 Ohm
Lead Channel Pacing Threshold Amplitude: 1 V
Lead Channel Pacing Threshold Pulse Width: 0.4 ms
Lead Channel Sensing Intrinsic Amplitude: 12 mV
Lead Channel Setting Pacing Amplitude: 1.25 V
Lead Channel Setting Pacing Pulse Width: 0.4 ms
Lead Channel Setting Sensing Sensitivity: 6 mV
Pulse Gen Model: 1240
Pulse Gen Serial Number: 7488472

## 2019-04-19 MED ORDER — METOPROLOL SUCCINATE ER 25 MG PO TB24: 25.0000 mg | ORAL_TABLET | Freq: Every day | ORAL | 3 refills | Status: DC

## 2019-04-19 NOTE — Progress Notes (Signed)
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 male who presents today for routine electrophysiology followup.  Since last being seen in our clinic, the patient reports doing relatively well.  In the fall of 2019, he had a fall resulting in neck injury with a prolonged hospitalization. Since then, he has had increased shortness of breath, some palpitations, and failure to thrive. Today, he denies symptoms of  chest pain,   lower extremity edema, dizziness, presyncope, or syncope.  The patient is otherwise without complaint today.   Past Medical History:  Diagnosis Date  . Anxiety   . Atrial fibrillation ([place])    Permanent  . Coronary artery disease    Mild nonobstructive 1/12  . DM (diabetes mellitus) ([place])   . GERD (gastroesophageal reflux disease)   . Gout   . Hearing disorder, cochlear   . Hiatal hernia   . HLD (hyperlipidemia)   . HTN (hypertension)   . S/P AV nodal ablation    s/p SJM PPM; gen change 02-01-13 by Dr Rayann Heman  . Smoldering multiple myeloma ([place]) 12/29/2017  . Warfarin anticoagulation    Past Surgical History:  Procedure Laterality Date  . CATARACT EXTRACTION Right   . COCHLEAR IMPLANT Right   . HEMORRHOID BANDING    . KNEE ARTHROSCOPY Right 1992   Dr Luna Glasgow   . PACEMAKER GENERATOR CHANGE Bilateral 02/01/2013   Procedure: PACEMAKER GENERATOR CHANGE;  Surgeon: Coralyn Mark, MD;  Location: [place] CATH LAB;  Service: Cardiovascular;  Laterality: Bilateral;  . PACEMAKER PLACEMENT  2002; 2014   SJM implanted by Dr Lovena Le with AV nodal ablation performed; gen change to Accent SR RF by Dr Rayann Heman 02-01-13  . RETINAL LASER PROCEDURE Left   . SLT LASER APPLICATION Left 44/11/2008   Procedure: SLT LASER APPLICATION;  Surgeon: Williams Che, MD;  Location: [facility];  Service: Ophthalmology;  Laterality: Left;    ROS- all systems are reviewed and negative except as per HPI above  Current Outpatient  Medications  Medication Sig Dispense Refill  . allopurinol (ZYLOPRIM) 100 MG tablet Take 100 mg by mouth Daily.     . Ascorbic Acid 500 MG TBCR Take 1 tablet by mouth daily.    . cholecalciferol (VITAMIN D3) 25 MCG (1000 UNIT) tablet Take 1,000 Units by mouth daily.    . clonazePAM (KLONOPIN) 0.5 MG tablet Take 0.5 mg by mouth as needed.     . colchicine 0.6 MG tablet Take 0.6 mg by mouth as needed.     . fish oil-omega-3 fatty acids 1000 MG capsule Take 1 g by mouth 2 (two) times daily.      Marland Kitchen HUMULIN 70/30 (70-30) 100 UNIT/ML injection Inject 30 Units into the skin 2 (two) times daily with a meal.     . isosorbide mononitrate (IMDUR) 60 MG 24 hr tablet Take 120 mg by mouth daily.    Marland Kitchen lisinopril (ZESTRIL) 10 MG tablet Take 10 mg by mouth every evening.     . memantine (NAMENDA) 10 MG tablet Take 10 mg by mouth 2 (two) times daily.    . metFORMIN (GLUCOPHAGE) 1000 MG tablet Take 1,000 mg by mouth 2 (two) times daily with a meal.      . metoprolol succinate (TOPROL-XL) 25 MG 24 hr tablet Take 0.5 tablets by mouth Daily.    . nitroGLYCERIN (NITROSTAT) 0.4 MG SL tablet Place 1 tablet (0.4 mg total) under the tongue  every 5 (five) minutes x 3 doses as needed. 25 tablet 3  . ONETOUCH VERIO test strip     . sertraline (ZOLOFT) 50 MG tablet Take 50 mg by mouth daily. Take 1  tab daily    . simvastatin (ZOCOR) 40 MG tablet Take 40 mg by mouth at bedtime.      . Tamsulosin HCl (FLOMAX PO) Take 20 mg by mouth daily.     Marland Kitchen warfarin (COUMADIN) 5 MG tablet Take 5-7.5 mg by mouth daily. 7.5 mg on Monday and Friday and 5 mg all other days (MANAGED BY PMD)     No current facility-administered medications for this visit.    Physical Exam: Vitals:   04/19/19 1111  BP: (!) 145/78  Pulse: 60  SpO2: 98%  Weight: 190 lb 12.8 oz (86.5 kg)  Height: 6' (1.829 m)    GEN- The patient is well appearing, alert and oriented x 3 today.   Head- normocephalic, atraumatic Eyes-  Sclera clear, conjunctiva  pink Ears- hearing intact Oropharynx- clear Lungs- Clear to ausculation bilaterally, normal work of breathing Chest- pacemaker pocket is well healed Heart- Regular rate and rhythm, no murmurs, rubs or gallops, PMI not laterally displaced GI- soft, NT, ND, + BS Extremities- no clubbing, cyanosis, or edema  Pacemaker interrogation- reviewed in detail today,  See PACEART report  Assessment and Plan:  1. Symptomatic complete heart block s/p AVN ablation Normal pacemaker function See Pace Art report No changes today he is device dependant today  2. Permanent atrial fibrillation Continue anticoagulation long term Rates controlled post AVN ablation  3.  HTN Stable No change required today  4.  Palpitations No sustained arrhythmias on device interrogation Increase Toprol XL 41m daily today  5.  Depression/failure to thrive He has follow up with PCP this afternoon  6.  Shortness of breath Update echo with chronic RV pacing  Follow up with remotes, me in 1 year   JThompson GrayerMD, FMemorial Hospital2/07/2019 11:38 AM

## 2019-04-19 NOTE — Patient Instructions (Signed)
Medication Instructions:   Increase Toprol XL to 25mg  daily.   Continue all other medications.    Labwork: none  Testing/Procedures:  Your physician has requested that you have an echocardiogram. Echocardiography is a painless test that uses sound waves to create images of your heart. It provides your doctor with information about the size and shape of your heart and how well your heart's chambers and valves are working. This procedure takes approximately one hour. There are no restrictions for this procedure.  Office will contact with results via phone or letter.    Follow-Up: Your physician wants you to follow up in:  1 year.  You will receive a reminder letter in the mail one-two months in advance.  If you don't receive a letter, please call our office to schedule the follow up appointment   Any Other Special Instructions Will Be Listed Below (If Applicable).  If you need a refill on your cardiac medications before your next appointment, please call your pharmacy.

## 2019-04-19 NOTE — Telephone Encounter (Signed)
  Precert needed for: echo - sob, palps   Location: [facility]   Date: May 09, 2019

## 2019-04-24 ENCOUNTER — Telehealth: Payer: Self-pay | Admitting: *Deleted

## 2019-04-24 ENCOUNTER — Other Ambulatory Visit: Payer: Self-pay | Admitting: Internal Medicine

## 2019-04-24 DIAGNOSIS — R0602 Shortness of breath: Secondary | ICD-10-CM

## 2019-04-24 NOTE — Telephone Encounter (Signed)
Pt c/o chest pain consistently today and off and on over the last few days - rating pain 5/10 right now also c/o chest tightness with dizziness BP today 144/68 HR 61 - denies taking NTG and aware that he could take 1 for pain and with symptoms described that recommend evaluation at [facility] ED - pt and wife voiced understanding

## 2019-05-06 DIAGNOSIS — G4733 Obstructive sleep apnea (adult) (pediatric): Secondary | ICD-10-CM | POA: Diagnosis not present

## 2019-05-09 ENCOUNTER — Other Ambulatory Visit: Payer: Self-pay

## 2019-05-09 ENCOUNTER — Ambulatory Visit (INDEPENDENT_AMBULATORY_CARE_PROVIDER_SITE_OTHER): Payer: Medicare Other

## 2019-05-09 DIAGNOSIS — R0602 Shortness of breath: Secondary | ICD-10-CM

## 2019-05-10 DIAGNOSIS — I4891 Unspecified atrial fibrillation: Secondary | ICD-10-CM | POA: Diagnosis not present

## 2019-05-10 DIAGNOSIS — Z299 Encounter for prophylactic measures, unspecified: Secondary | ICD-10-CM | POA: Diagnosis not present

## 2019-05-10 DIAGNOSIS — I10 Essential (primary) hypertension: Secondary | ICD-10-CM | POA: Diagnosis not present

## 2019-05-10 DIAGNOSIS — D6869 Other thrombophilia: Secondary | ICD-10-CM | POA: Diagnosis not present

## 2019-05-10 DIAGNOSIS — I509 Heart failure, unspecified: Secondary | ICD-10-CM | POA: Diagnosis not present

## 2019-05-21 ENCOUNTER — Telehealth: Payer: Self-pay | Admitting: Cardiology

## 2019-05-21 NOTE — Telephone Encounter (Signed)
Patient notified and verbalized understanding.  Copy to pcp.

## 2019-05-21 NOTE — Telephone Encounter (Signed)
ECHO Thompson Grayer, MD  05/19/2019 10:34 PM EST    Results reviewed. Sonia Baller, please inform pt of result.

## 2019-05-21 NOTE — Telephone Encounter (Signed)
Requested test results of echo.

## 2019-05-22 DIAGNOSIS — I10 Essential (primary) hypertension: Secondary | ICD-10-CM | POA: Diagnosis not present

## 2019-05-22 DIAGNOSIS — Z789 Other specified health status: Secondary | ICD-10-CM | POA: Diagnosis not present

## 2019-05-22 DIAGNOSIS — K5900 Constipation, unspecified: Secondary | ICD-10-CM | POA: Diagnosis not present

## 2019-05-22 DIAGNOSIS — G309 Alzheimer's disease, unspecified: Secondary | ICD-10-CM | POA: Diagnosis not present

## 2019-05-22 DIAGNOSIS — Z299 Encounter for prophylactic measures, unspecified: Secondary | ICD-10-CM | POA: Diagnosis not present

## 2019-05-31 DIAGNOSIS — G309 Alzheimer's disease, unspecified: Secondary | ICD-10-CM | POA: Diagnosis not present

## 2019-05-31 DIAGNOSIS — I509 Heart failure, unspecified: Secondary | ICD-10-CM | POA: Diagnosis not present

## 2019-05-31 DIAGNOSIS — Z299 Encounter for prophylactic measures, unspecified: Secondary | ICD-10-CM | POA: Diagnosis not present

## 2019-06-06 DIAGNOSIS — G4733 Obstructive sleep apnea (adult) (pediatric): Secondary | ICD-10-CM | POA: Diagnosis not present

## 2019-06-12 DIAGNOSIS — R002 Palpitations: Secondary | ICD-10-CM | POA: Diagnosis not present

## 2019-06-12 DIAGNOSIS — K219 Gastro-esophageal reflux disease without esophagitis: Secondary | ICD-10-CM | POA: Diagnosis not present

## 2019-06-19 ENCOUNTER — Inpatient Hospital Stay (HOSPITAL_COMMUNITY): Payer: Medicare Other

## 2019-06-26 ENCOUNTER — Ambulatory Visit (HOSPITAL_COMMUNITY): Payer: Medicare Other | Admitting: Hematology

## 2019-07-10 ENCOUNTER — Other Ambulatory Visit: Payer: Self-pay

## 2019-07-10 ENCOUNTER — Inpatient Hospital Stay (HOSPITAL_COMMUNITY): Payer: Medicare Other | Attending: Hematology

## 2019-07-10 DIAGNOSIS — C9000 Multiple myeloma not having achieved remission: Secondary | ICD-10-CM | POA: Insufficient documentation

## 2019-07-10 DIAGNOSIS — D472 Monoclonal gammopathy: Secondary | ICD-10-CM

## 2019-07-10 LAB — CBC WITH DIFFERENTIAL/PLATELET
Abs Immature Granulocytes: 0.01 10*3/uL (ref 0.00–0.07)
Basophils Absolute: 0 10*3/uL (ref 0.0–0.1)
Basophils Relative: 1 %
Eosinophils Absolute: 0.1 10*3/uL (ref 0.0–0.5)
Eosinophils Relative: 2 %
HCT: 36 % — ABNORMAL LOW (ref 39.0–52.0)
Hemoglobin: 12 g/dL — ABNORMAL LOW (ref 13.0–17.0)
Immature Granulocytes: 0 %
Lymphocytes Relative: 26 %
Lymphs Abs: 0.9 10*3/uL (ref 0.7–4.0)
MCH: 35.7 pg — ABNORMAL HIGH (ref 26.0–34.0)
MCHC: 33.3 g/dL (ref 30.0–36.0)
MCV: 107.1 fL — ABNORMAL HIGH (ref 80.0–100.0)
Monocytes Absolute: 0.3 10*3/uL (ref 0.1–1.0)
Monocytes Relative: 10 %
Neutro Abs: 2.2 10*3/uL (ref 1.7–7.7)
Neutrophils Relative %: 61 %
Platelets: 99 10*3/uL — ABNORMAL LOW (ref 150–400)
RBC: 3.36 MIL/uL — ABNORMAL LOW (ref 4.22–5.81)
RDW: 13.5 % (ref 11.5–15.5)
WBC: 3.5 10*3/uL — ABNORMAL LOW (ref 4.0–10.5)
nRBC: 0 % (ref 0.0–0.2)

## 2019-07-10 LAB — COMPREHENSIVE METABOLIC PANEL
ALT: 23 U/L (ref 0–44)
AST: 28 U/L (ref 15–41)
Albumin: 3.2 g/dL — ABNORMAL LOW (ref 3.5–5.0)
Alkaline Phosphatase: 103 U/L (ref 38–126)
Anion gap: 10 (ref 5–15)
BUN: 17 mg/dL (ref 8–23)
CO2: 25 mmol/L (ref 22–32)
Calcium: 9 mg/dL (ref 8.9–10.3)
Chloride: 102 mmol/L (ref 98–111)
Creatinine, Ser: 1 mg/dL (ref 0.61–1.24)
GFR calc Af Amer: >60 mL/min (ref >60)
GFR calc non Af Amer: >60 mL/min (ref >60)
Glucose, Bld: 161 mg/dL — ABNORMAL HIGH (ref 70–99)
Potassium: 4.9 mmol/L (ref 3.5–5.1)
Sodium: 137 mmol/L (ref 135–145)
Total Bilirubin: 1.4 mg/dL — ABNORMAL HIGH (ref 0.3–1.2)
Total Protein: 7.3 g/dL (ref 6.5–8.1)

## 2019-07-10 LAB — LACTATE DEHYDROGENASE: LDH: 157 U/L (ref 98–192)

## 2019-07-11 DIAGNOSIS — Z299 Encounter for prophylactic measures, unspecified: Secondary | ICD-10-CM | POA: Diagnosis not present

## 2019-07-11 DIAGNOSIS — I10 Essential (primary) hypertension: Secondary | ICD-10-CM | POA: Diagnosis not present

## 2019-07-11 DIAGNOSIS — I4891 Unspecified atrial fibrillation: Secondary | ICD-10-CM | POA: Diagnosis not present

## 2019-07-11 DIAGNOSIS — U071 COVID-19: Secondary | ICD-10-CM | POA: Diagnosis not present

## 2019-07-11 DIAGNOSIS — I509 Heart failure, unspecified: Secondary | ICD-10-CM | POA: Diagnosis not present

## 2019-07-11 DIAGNOSIS — E1122 Type 2 diabetes mellitus with diabetic chronic kidney disease: Secondary | ICD-10-CM | POA: Diagnosis not present

## 2019-07-11 LAB — PROTEIN ELECTROPHORESIS, SERUM
A/G Ratio: 1 (ref 0.7–1.7)
Albumin ELP: 3.6 g/dL (ref 2.9–4.4)
Alpha-1-Globulin: 0.1 g/dL (ref 0.0–0.4)
Alpha-2-Globulin: 0.6 g/dL (ref 0.4–1.0)
Beta Globulin: 0.8 g/dL (ref 0.7–1.3)
Gamma Globulin: 2 g/dL — ABNORMAL HIGH (ref 0.4–1.8)
Globulin, Total: 3.5 g/dL (ref 2.2–3.9)
M-Spike, %: 1.2 g/dL — ABNORMAL HIGH
Total Protein ELP: 7.1 g/dL (ref 6.0–8.5)

## 2019-07-11 LAB — KAPPA/LAMBDA LIGHT CHAINS
Kappa free light chain: 79.8 mg/L — ABNORMAL HIGH (ref 3.3–19.4)
Kappa, lambda light chain ratio: 3.24 — ABNORMAL HIGH (ref 0.26–1.65)
Lambda free light chains: 24.6 mg/L (ref 5.7–26.3)

## 2019-07-12 DIAGNOSIS — R002 Palpitations: Secondary | ICD-10-CM | POA: Diagnosis not present

## 2019-07-12 DIAGNOSIS — I10 Essential (primary) hypertension: Secondary | ICD-10-CM | POA: Diagnosis not present

## 2019-07-16 ENCOUNTER — Encounter (HOSPITAL_COMMUNITY): Payer: Self-pay | Admitting: Hematology

## 2019-07-16 ENCOUNTER — Inpatient Hospital Stay (HOSPITAL_COMMUNITY): Payer: Medicare Other | Attending: Hematology | Admitting: Hematology

## 2019-07-16 ENCOUNTER — Other Ambulatory Visit: Payer: Self-pay

## 2019-07-16 VITALS — BP 122/59 | HR 62 | Temp 96.9°F | Resp 16 | Wt 196.6 lb

## 2019-07-16 DIAGNOSIS — Z8719 Personal history of other diseases of the digestive system: Secondary | ICD-10-CM | POA: Insufficient documentation

## 2019-07-16 DIAGNOSIS — C9000 Multiple myeloma not having achieved remission: Secondary | ICD-10-CM | POA: Diagnosis not present

## 2019-07-16 DIAGNOSIS — Z8249 Family history of ischemic heart disease and other diseases of the circulatory system: Secondary | ICD-10-CM | POA: Insufficient documentation

## 2019-07-16 DIAGNOSIS — Z836 Family history of other diseases of the respiratory system: Secondary | ICD-10-CM | POA: Insufficient documentation

## 2019-07-16 DIAGNOSIS — R519 Headache, unspecified: Secondary | ICD-10-CM | POA: Diagnosis not present

## 2019-07-16 DIAGNOSIS — Z794 Long term (current) use of insulin: Secondary | ICD-10-CM | POA: Diagnosis not present

## 2019-07-16 DIAGNOSIS — D696 Thrombocytopenia, unspecified: Secondary | ICD-10-CM | POA: Diagnosis not present

## 2019-07-16 DIAGNOSIS — Z7901 Long term (current) use of anticoagulants: Secondary | ICD-10-CM | POA: Insufficient documentation

## 2019-07-16 DIAGNOSIS — R42 Dizziness and giddiness: Secondary | ICD-10-CM | POA: Insufficient documentation

## 2019-07-16 DIAGNOSIS — Z79899 Other long term (current) drug therapy: Secondary | ICD-10-CM | POA: Insufficient documentation

## 2019-07-16 DIAGNOSIS — Z882 Allergy status to sulfonamides status: Secondary | ICD-10-CM | POA: Diagnosis not present

## 2019-07-16 DIAGNOSIS — Z801 Family history of malignant neoplasm of trachea, bronchus and lung: Secondary | ICD-10-CM | POA: Diagnosis not present

## 2019-07-16 DIAGNOSIS — D472 Monoclonal gammopathy: Secondary | ICD-10-CM

## 2019-07-16 DIAGNOSIS — D539 Nutritional anemia, unspecified: Secondary | ICD-10-CM | POA: Insufficient documentation

## 2019-07-16 NOTE — Assessment & Plan Note (Signed)
1.  IgA kappa smoldering myeloma: -Bone marrow biopsy on 09/05/2017 shows 12% plasma cells. -Skeletal survey on 08/23/2018 was negative for lytic lesions. -I reviewed blood work from 07/10/2019.  M spike is 1.2 g and stable.  Creatinine is slightly better at 1.0.  Calcium is normal at 9.  Albumin is 3.2.  Hemoglobin is 12 g. -Free kappa light chains are 79.8 and ratio is 3.24. -I plan to repeat his labs in 4 months.  We will also repeat myeloma labs and skeletal survey.  2.  Macrocytic anemia: -Hemoglobin is 12 and hematocrit is 36.  MCV is 107.  Previous work-up for T70, folic acid was normal.  3.  Thrombocytopenia: -He has mild to moderate thrombocytopenia.  Platelet count is 99. -CT of the abdomen and pelvis on 04/11/2017 showed normal spleen. -Bone marrow biopsy showed normal megakaryocytes.  Most likely etiology is ITP.

## 2019-07-16 NOTE — Progress Notes (Signed)
Jay Campbell, [place] [id number]   CLINIC:  Medical Oncology/Hematology  PCP:  Jay Campbell, [place] [address] [phone]   REASON FOR VISIT:  Follow-up for smoldering myeloma, macrocytic anemia and thrombocytopenia.  PRIOR THERAPY: None  CURRENT THERAPY: Observation  INTERVAL HISTORY:  Jay Campbell 74 y.o. male is seen for follow-up of smoldering myeloma.  Appetite is 75%.  Energy levels are low.  Denies any new onset bone pains.  Denies any fevers or infections.  No recent hospitalizations.    REVIEW OF SYSTEMS:  Review of Systems  Neurological: Positive for dizziness and headaches.  All other systems reviewed and are negative.    PAST MEDICAL/SURGICAL HISTORY:  Past Medical History:  Diagnosis Date  . Anxiety   . Atrial fibrillation ([place])    Permanent  . Coronary artery disease    Mild nonobstructive 1/12  . DM (diabetes mellitus) ([place])   . GERD (gastroesophageal reflux disease)   . Gout   . Hearing disorder, cochlear   . Hiatal hernia   . HLD (hyperlipidemia)   . HTN (hypertension)   . S/P AV nodal ablation    s/p SJM PPM; gen change 02-01-13 by [name]  . Smoldering multiple myeloma ([place]) 12/29/2017  . Warfarin anticoagulation    Past Surgical History:  Procedure Laterality Date  . CATARACT EXTRACTION Right   . COCHLEAR IMPLANT Right   . HEMORRHOID BANDING    . KNEE ARTHROSCOPY Right 1992   [name]   . PACEMAKER GENERATOR CHANGE Bilateral 02/01/2013   Procedure: PACEMAKER GENERATOR CHANGE;  Surgeon: Jay Mark, MD;  Location: [facility];  Service: Cardiovascular;  Laterality: Bilateral;  . PACEMAKER PLACEMENT  2002; 2014   SJM implanted by [name] with AV nodal ablation performed; gen change to [employer] SR RF by [name] 02-01-13  . RETINAL LASER PROCEDURE Left   . SLT LASER APPLICATION Left 67/[birth year]/1948   Procedure: SLT LASER APPLICATION;  Surgeon: Jay Che, MD;  Location: [facility];   Service: Ophthalmology;  Laterality: Left;     SOCIAL HISTORY:  Social History   Socioeconomic History  . Marital status: Married    Spouse name: Not on file  . Number of children: Not on file  . Years of education: Not on file  . Highest education level: Not on file  Occupational History  . Not on file  Tobacco Use  . Smoking status: Never Smoker  . Smokeless tobacco: Never Used  Substance and Sexual Activity  . Alcohol use: No    Alcohol/week: 0.0 standard drinks  . Drug use: No  . Sexual activity: Not on file  Other Topics Concern  . Not on file  Social History Narrative  . Not on file   Social Determinants of Health   Financial Resource Strain:   . Difficulty of Paying Living Expenses:   Food Insecurity:   . Worried About Charity fundraiser in the Last Year:   . Arboriculturist in the Last Year:   Transportation Needs:   . Film/video editor (Medical):   Marland Kitchen Lack of Transportation (Non-Medical):   Physical Activity:   . Days of Exercise per Week:   . Minutes of Exercise per Session:   Stress:   . Feeling of Stress :   Social Connections:   . Frequency of Communication with Friends and Family:   . Frequency of Social Gatherings with Friends and Family:   .  Attends Religious Services:   . Active Member of Clubs or Organizations:   . Attends Archivist Meetings:   Marland Kitchen Marital Status:   Intimate Partner Violence:   . Fear of Current or Ex-Partner:   . Emotionally Abused:   Marland Kitchen Physically Abused:   . Sexually Abused:     FAMILY HISTORY:  Family History  Problem Relation Age of Onset  . COPD Mother   . Emphysema Mother   . Cancer - Lung Father   . Heart disease Paternal Uncle   . Stomach cancer Neg Hx   . Colon cancer Neg Hx     CURRENT MEDICATIONS:  Outpatient Encounter Medications as of 07/16/2019  Medication Sig Note  . allopurinol (ZYLOPRIM) 100 MG tablet Take 100 mg by mouth Daily.    . Ascorbic Acid 500 MG TBCR Take 1 tablet by mouth  daily.   . cholecalciferol (VITAMIN D3) 25 MCG (1000 UNIT) tablet Take 1,000 Units by mouth daily.   . clonazePAM (KLONOPIN) 0.5 MG tablet Take 0.5 mg by mouth as needed.    . colchicine 0.6 MG tablet Take 0.6 mg by mouth as needed.    . famotidine (PEPCID) 20 MG tablet Take 20 mg by mouth 2 (two) times daily.   . fish oil-omega-3 fatty acids 1000 MG capsule Take 1 g by mouth 2 (two) times daily.     Marland Kitchen HUMULIN 70/30 (70-30) 100 UNIT/ML injection Inject 30 Units into the skin 2 (two) times daily with a meal.    . isosorbide mononitrate (IMDUR) 60 MG 24 hr tablet Take 120 mg by mouth daily.   Marland Kitchen lisinopril (ZESTRIL) 10 MG tablet Take 10 mg by mouth every evening.    . memantine (NAMENDA) 10 MG tablet Take 10 mg by mouth 2 (two) times daily.   . metFORMIN (GLUCOPHAGE) 1000 MG tablet Take 1,000 mg by mouth 2 (two) times daily with a meal.     . metoprolol succinate (TOPROL-XL) 25 MG 24 hr tablet Take 1 tablet (25 mg total) by mouth daily.   . nitroGLYCERIN (NITROSTAT) 0.4 MG SL tablet Place 1 tablet (0.4 mg total) under the tongue every 5 (five) minutes x 3 doses as needed. 11/15/2016: On hand   . ONETOUCH VERIO test strip    . sertraline (ZOLOFT) 100 MG tablet Take 100 mg by mouth daily.   . simvastatin (ZOCOR) 40 MG tablet Take 40 mg by mouth at bedtime.     . Tamsulosin HCl (FLOMAX PO) Take 20 mg by mouth daily.    Marland Kitchen warfarin (COUMADIN) 5 MG tablet Take 5-7.5 mg by mouth daily. 7.5 mg on Monday and Friday and 5 mg all other days (MANAGED BY PMD)   . [DISCONTINUED] sertraline (ZOLOFT) 50 MG tablet Take 50 mg by mouth daily. Take 1  tab daily    No facility-administered encounter medications on file as of 07/16/2019.    ALLERGIES:  Allergies  Allergen Reactions  . Contrast Media [Iodinated Diagnostic Agents]   . Shellfish Allergy Itching and Rash  . Sulfonamide Derivatives Itching and Rash     PHYSICAL EXAM:  ECOG Performance status: 1  Vitals:   07/16/19 1436  BP: (!) 122/59  Pulse:  62  Resp: 16  Temp: (!) 96.9 F (36.1 C)  SpO2: 99%   Filed Weights   07/16/19 1436  Weight: 196 lb 9.6 oz (89.2 kg)   Physical Exam Vitals reviewed.  Constitutional:      Appearance: Normal appearance.  Cardiovascular:  Rate and Rhythm: Normal rate and regular rhythm.     Heart sounds: Normal heart sounds.  Pulmonary:     Effort: Pulmonary effort is normal.     Breath sounds: Normal breath sounds.  Abdominal:     Palpations: Abdomen is soft.  Neurological:     General: No focal deficit present.     Mental Status: He is alert and oriented to person, place, and time.  Psychiatric:        Mood and Affect: Mood normal.        Behavior: Behavior normal.      LABORATORY DATA:  I have reviewed the labs as listed.  CBC    Component Value Date/Time   WBC 3.5 (L) 07/10/2019 1250   RBC 3.36 (L) 07/10/2019 1250   HGB 12.0 (L) 07/10/2019 1250   HCT 36.0 (L) 07/10/2019 1250   HCT 33.4 (L) 03/12/2018 0417   PLT 99 (L) 07/10/2019 1250   MCV 107.1 (H) 07/10/2019 1250   MCH 35.7 (H) 07/10/2019 1250   MCHC 33.3 07/10/2019 1250   RDW 13.5 07/10/2019 1250   LYMPHSABS 0.9 07/10/2019 1250   MONOABS 0.3 07/10/2019 1250   EOSABS 0.1 07/10/2019 1250   BASOSABS 0.0 07/10/2019 1250   CMP Latest Ref Rng & Units 07/10/2019 03/21/2019 12/04/2018  Glucose 70 - 99 mg/dL 161(H) 213(H) 298(H)  BUN 8 - 23 mg/dL 17 31(H) 21  Creatinine 0.61 - 1.24 mg/dL 1.00 1.21 1.27(H)  Sodium 135 - 145 mmol/L 137 136 135  Potassium 3.5 - 5.1 mmol/L 4.9 4.6 4.9  Chloride 98 - 111 mmol/L 102 104 103  CO2 22 - 32 mmol/L '25 24 25  ' Calcium 8.9 - 10.3 mg/dL 9.0 9.2 9.6  Total Protein 6.5 - 8.1 g/dL 7.3 7.9 7.6  Total Bilirubin 0.3 - 1.2 mg/dL 1.4(H) 1.1 0.8  Alkaline Phos 38 - 126 U/L 103 90 115  AST 15 - 41 U/L 28 27 33  ALT 0 - 44 U/L '23 22 24    ' DIAGNOSTIC IMAGING:  I have independently reviewed the scans and discussed with the patient.  ASSESSMENT & PLAN:  Smoldering multiple myeloma ([place]) 1.   IgA kappa smoldering myeloma: -Bone marrow biopsy on 09/05/2017 shows 12% plasma cells. -Skeletal survey on 08/23/2018 was negative for lytic lesions. -I reviewed blood work from 07/10/2019.  M spike is 1.2 g and stable.  Creatinine is slightly better at 1.0.  Calcium is normal at 9.  Albumin is 3.2.  Hemoglobin is 12 g. -Free kappa light chains are 79.8 and ratio is 3.24. -I plan to repeat his labs in 4 months.  We will also repeat myeloma labs and skeletal survey.  2.  Macrocytic anemia: -Hemoglobin is 12 and hematocrit is 36.  MCV is 107.  Previous work-up for W97, folic acid was normal.  3.  Thrombocytopenia: -He has mild to moderate thrombocytopenia.  Platelet count is 99. -CT of the abdomen and pelvis on 04/11/2017 showed normal spleen. -Bone marrow biopsy showed normal megakaryocytes.  Most likely etiology is ITP.     Orders placed this encounter:  Orders Placed This Encounter  Procedures  . DG Bone Survey Met  . Protein electrophoresis, serum  . Kappa/lambda light chains  . Lactate dehydrogenase  . CBC with Differential  . Comprehensive metabolic panel      Derek Jack, MD [place] [phone]

## 2019-07-16 NOTE — Progress Notes (Deleted)
Patient Care Team: Monico Blitz, MD as PCP - General (Internal Medicine) Satira Sark, MD as PCP - Cardiology (Cardiology) Thompson Grayer, MD as PCP - Electrophysiology (Cardiology)  DIAGNOSIS:  No diagnosis found.  SUMMARY OF ONCOLOGIC HISTORY: Oncology History   No history exists.    CHIEF COMPLIANT: Smoldering multiple myeloma.  INTERVAL HISTORY: Jay Campbell is a 74 year old very pleasant white male who is seen in follow-up visit for smoldering myeloma.  Denies any new onset bone pains.  Denies any fevers, night sweats or weight loss in the last 6 months.  Denies any hospitalizations.  Denies any recurrent infections.  Appetite is 100%.  Energy levels are 75%.  No bleeding per rectum or melena.  No recent ER visits or hospitalizations.  No easy bruising or bleeding reported.  REVIEW OF SYSTEMS:   Constitutional: Denies fevers, chills or abnormal weight loss Eyes: Denies blurriness of vision Ears, nose, mouth, throat, and face: Denies mucositis or sore throat Respiratory: Denies cough, dyspnea or wheezes Cardiovascular: Denies palpitation, chest discomfort Gastrointestinal:  Denies nausea, heartburn or change in bowel habits Skin: Denies abnormal skin rashes Lymphatics: Denies new lymphadenopathy or easy bruising Neurological:Denies numbness, tingling or new weaknesses Behavioral/Psych: Mood is stable, no new changes  Extremities: No lower extremity edema All other systems were reviewed with the patient and are negative.  I have reviewed the past medical history, past surgical history, social history and family history with the patient and they are unchanged from previous note.  ALLERGIES:  is allergic to contrast media [iodinated diagnostic agents]; shellfish allergy; and sulfonamide derivatives.  MEDICATIONS:  Current Outpatient Medications  Medication Sig Dispense Refill  . allopurinol (ZYLOPRIM) 100 MG tablet Take 100 mg by mouth Daily.     . Ascorbic Acid  500 MG TBCR Take 1 tablet by mouth daily.    . cholecalciferol (VITAMIN D3) 25 MCG (1000 UNIT) tablet Take 1,000 Units by mouth daily.    . clonazePAM (KLONOPIN) 0.5 MG tablet Take 0.5 mg by mouth as needed.     . colchicine 0.6 MG tablet Take 0.6 mg by mouth as needed.     . famotidine (PEPCID) 20 MG tablet Take 20 mg by mouth 2 (two) times daily.    . fish oil-omega-3 fatty acids 1000 MG capsule Take 1 g by mouth 2 (two) times daily.      Marland Kitchen HUMULIN 70/30 (70-30) 100 UNIT/ML injection Inject 30 Units into the skin 2 (two) times daily with a meal.     . isosorbide mononitrate (IMDUR) 60 MG 24 hr tablet Take 120 mg by mouth daily.    Marland Kitchen lisinopril (ZESTRIL) 10 MG tablet Take 10 mg by mouth every evening.     . memantine (NAMENDA) 10 MG tablet Take 10 mg by mouth 2 (two) times daily.    . metFORMIN (GLUCOPHAGE) 1000 MG tablet Take 1,000 mg by mouth 2 (two) times daily with a meal.      . metoprolol succinate (TOPROL-XL) 25 MG 24 hr tablet Take 1 tablet (25 mg total) by mouth daily. 90 tablet 3  . nitroGLYCERIN (NITROSTAT) 0.4 MG SL tablet Place 1 tablet (0.4 mg total) under the tongue every 5 (five) minutes x 3 doses as needed. 25 tablet 3  . ONETOUCH VERIO test strip     . sertraline (ZOLOFT) 100 MG tablet Take 100 mg by mouth daily.    . simvastatin (ZOCOR) 40 MG tablet Take 40 mg by mouth at bedtime.      Marland Kitchen  Tamsulosin HCl (FLOMAX PO) Take 20 mg by mouth daily.     Marland Kitchen warfarin (COUMADIN) 5 MG tablet Take 5-7.5 mg by mouth daily. 7.5 mg on Monday and Friday and 5 mg all other days (MANAGED BY PMD)     No current facility-administered medications for this visit.    PHYSICAL EXAMINATION: ECOG PERFORMANCE STATUS: 1 - Symptomatic but completely ambulatory  Vitals:   07/16/19 1436  BP: (!) 122/59  Pulse: 62  Resp: 16  Temp: (!) 96.9 F (36.1 C)  SpO2: 99%   Filed Weights   07/16/19 1436  Weight: 196 lb 9.6 oz (89.2 kg)    GENERAL:alert, no distress and comfortable SKIN: skin color,  texture, turgor are normal, no rashes or significant lesions EYES: normal, Conjunctiva are pink and non-injected, sclera clear OROPHARYNX:no mucositis, no erythema and lips, buccal mucosa, and tongue normal  NECK: supple, thyroid normal size, non-tender, without nodularity LYMPH:  no palpable lymphadenopathy in the cervical, axillary or inguinal LUNGS: clear to auscultation and percussion with normal breathing effort HEART: regular rate & rhythm and no murmurs and no lower extremity edema ABDOMEN:abdomen soft, non-tender and normal bowel sounds MUSCULOSKELETAL:no cyanosis of digits and no clubbing   EXTREMITIES: No lower extremity edema   LABORATORY DATA:  I have reviewed the data as listed CMP Latest Ref Rng & Units 07/10/2019 03/21/2019 12/04/2018  Glucose 70 - 99 mg/dL 161(H) 213(H) 298(H)  BUN 8 - 23 mg/dL 17 31(H) 21  Creatinine 0.61 - 1.24 mg/dL 1.00 1.21 1.27(H)  Sodium 135 - 145 mmol/L 137 136 135  Potassium 3.5 - 5.1 mmol/L 4.9 4.6 4.9  Chloride 98 - 111 mmol/L 102 104 103  CO2 22 - 32 mmol/L '25 24 25  ' Calcium 8.9 - 10.3 mg/dL 9.0 9.2 9.6  Total Protein 6.5 - 8.1 g/dL 7.3 7.9 7.6  Total Bilirubin 0.3 - 1.2 mg/dL 1.4(H) 1.1 0.8  Alkaline Phos 38 - 126 U/L 103 90 115  AST 15 - 41 U/L 28 27 33  ALT 0 - 44 U/L '23 22 24   ' No results found for: [id number]   Lab Results  Component Value Date   WBC 3.5 (L) 07/10/2019   HGB 12.0 (L) 07/10/2019   HCT 36.0 (L) 07/10/2019   MCV 107.1 (H) 07/10/2019   PLT 99 (L) 07/10/2019   NEUTROABS 2.2 07/10/2019    ASSESSMENT & PLAN:  No problem-specific Assessment & Plan notes found for this encounter.   Total time spent is 25 minutes with more than 50% of the time spent face-to-face discussing disease prognosis, follow-up visits and coordination of care.  No orders of the defined types were placed in this encounter.  The patient has a good understanding of the overall plan. he agrees with it. he will call with any problems that may  develop before the next visit here.   Donia Ast, LPN 04/54/09

## 2019-07-16 NOTE — Patient Instructions (Addendum)
[place] at [facility] Discharge Instructions  You were seen today by Dr. Delton Coombes. He went over your recent lab results. He will see you back in 4 months for labs, skeletal survey and follow up.   Thank you for choosing [place] at [facility] to provide your oncology and hematology care.  To afford each patient quality time with our provider, please arrive at least 15 minutes before your scheduled appointment time.   If you have a lab appointment with the [facility] please come in thru the  Main Entrance and check in at the main information desk  You need to re-schedule your appointment should you arrive 10 or more minutes late.  We strive to give you quality time with our providers, and arriving late affects you and other patients whose appointments are after yours.  Also, if you no show three or more times for appointments you may be dismissed from the clinic at the providers discretion.     Again, thank you for choosing [facility].  Our hope is that these requests will decrease the amount of time that you wait before being seen by our physicians.       _____________________________________________________________  Should you have questions after your visit to [facility], please contact our office at [phone] between the hours of 8:00 a.m. and 4:30 p.m.  Voicemails left after 4:00 p.m. will not be returned until the following business day.  For prescription refill requests, have your pharmacy contact our office and allow 72 hours.    Cancer Center Support Programs:   > Cancer Support Group  2nd Tuesday of the month 1pm-2pm, Journey Room

## 2019-08-06 DIAGNOSIS — M79605 Pain in left leg: Secondary | ICD-10-CM | POA: Diagnosis not present

## 2019-08-06 DIAGNOSIS — H401224 Low-tension glaucoma, left eye, indeterminate stage: Secondary | ICD-10-CM | POA: Diagnosis not present

## 2019-08-06 DIAGNOSIS — H81393 Other peripheral vertigo, bilateral: Secondary | ICD-10-CM | POA: Diagnosis not present

## 2019-08-06 DIAGNOSIS — M79604 Pain in right leg: Secondary | ICD-10-CM | POA: Diagnosis not present

## 2019-08-06 DIAGNOSIS — E1140 Type 2 diabetes mellitus with diabetic neuropathy, unspecified: Secondary | ICD-10-CM | POA: Diagnosis not present

## 2019-08-09 DIAGNOSIS — I10 Essential (primary) hypertension: Secondary | ICD-10-CM | POA: Diagnosis not present

## 2019-08-09 DIAGNOSIS — E1122 Type 2 diabetes mellitus with diabetic chronic kidney disease: Secondary | ICD-10-CM | POA: Diagnosis not present

## 2019-08-09 DIAGNOSIS — Z299 Encounter for prophylactic measures, unspecified: Secondary | ICD-10-CM | POA: Diagnosis not present

## 2019-08-09 DIAGNOSIS — I4891 Unspecified atrial fibrillation: Secondary | ICD-10-CM | POA: Diagnosis not present

## 2019-08-12 DIAGNOSIS — E785 Hyperlipidemia, unspecified: Secondary | ICD-10-CM | POA: Diagnosis not present

## 2019-08-12 DIAGNOSIS — I129 Hypertensive chronic kidney disease with stage 1 through stage 4 chronic kidney disease, or unspecified chronic kidney disease: Secondary | ICD-10-CM | POA: Diagnosis not present

## 2019-08-12 DIAGNOSIS — E1122 Type 2 diabetes mellitus with diabetic chronic kidney disease: Secondary | ICD-10-CM | POA: Diagnosis not present

## 2019-08-12 DIAGNOSIS — N189 Chronic kidney disease, unspecified: Secondary | ICD-10-CM | POA: Diagnosis not present

## 2019-08-16 DIAGNOSIS — N183 Chronic kidney disease, stage 3 unspecified: Secondary | ICD-10-CM | POA: Diagnosis not present

## 2019-08-16 DIAGNOSIS — E1165 Type 2 diabetes mellitus with hyperglycemia: Secondary | ICD-10-CM | POA: Diagnosis not present

## 2019-08-16 DIAGNOSIS — I10 Essential (primary) hypertension: Secondary | ICD-10-CM | POA: Diagnosis not present

## 2019-08-16 DIAGNOSIS — Z299 Encounter for prophylactic measures, unspecified: Secondary | ICD-10-CM | POA: Diagnosis not present

## 2019-08-16 DIAGNOSIS — E1122 Type 2 diabetes mellitus with diabetic chronic kidney disease: Secondary | ICD-10-CM | POA: Diagnosis not present

## 2019-08-16 DIAGNOSIS — I509 Heart failure, unspecified: Secondary | ICD-10-CM | POA: Diagnosis not present

## 2019-09-11 DIAGNOSIS — I10 Essential (primary) hypertension: Secondary | ICD-10-CM | POA: Diagnosis not present

## 2019-09-11 DIAGNOSIS — K219 Gastro-esophageal reflux disease without esophagitis: Secondary | ICD-10-CM | POA: Diagnosis not present

## 2019-09-29 IMAGING — DX DG BONE SURVEY MET
9 of 10 series · 9 of 10 positions shown · non-contrast
Comparison: None.

CLINICAL DATA: Monoclonal gammopathy

EXAM:
METASTATIC BONE SURVEY

[skull lat]
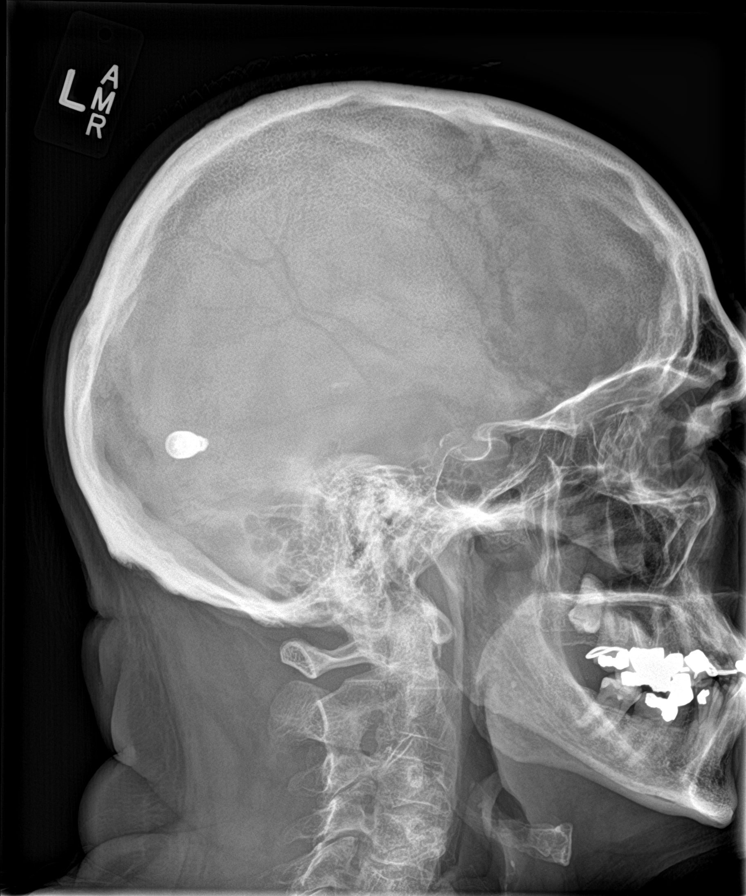

[shoulder ap (1 of 2)]
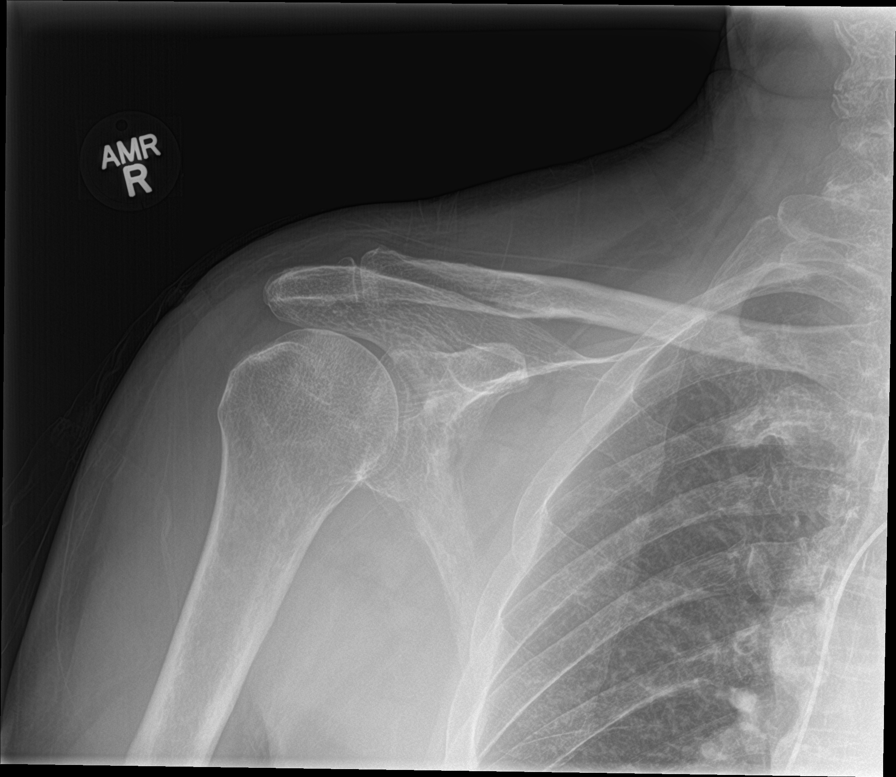

[shoulder ap (2 of 2)]
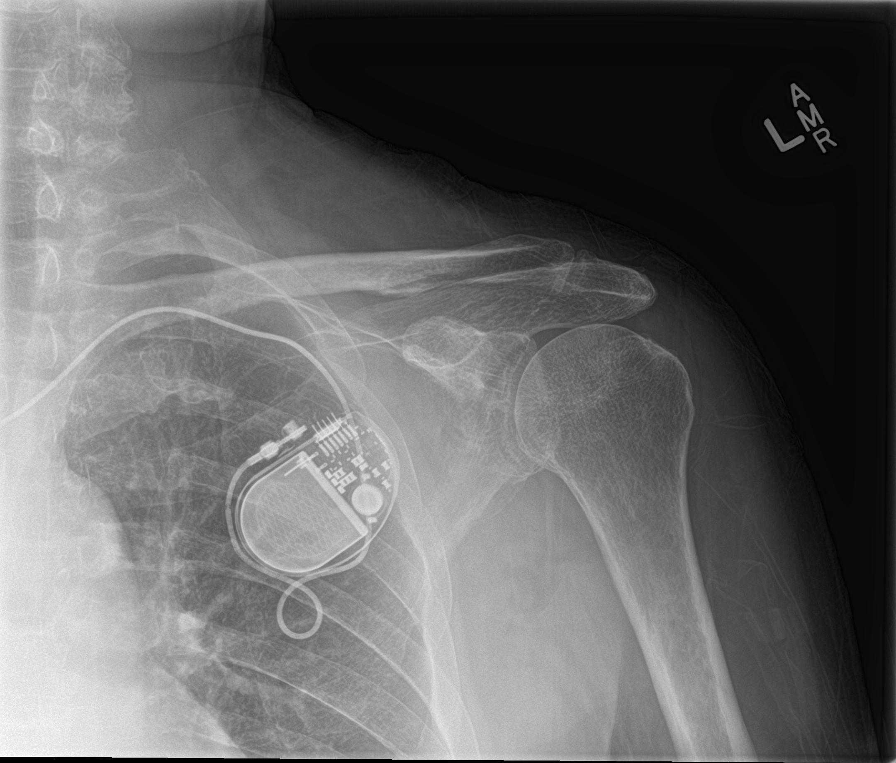

[humerus ap (1 of 2)]
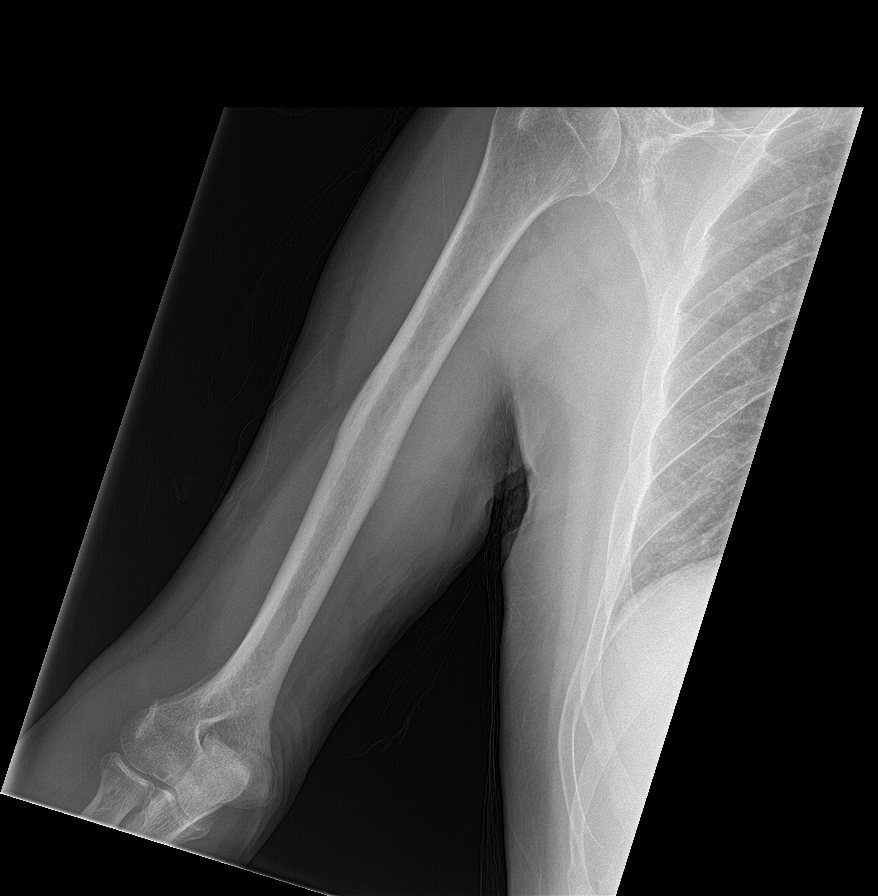

[humerus ap (2 of 2)]
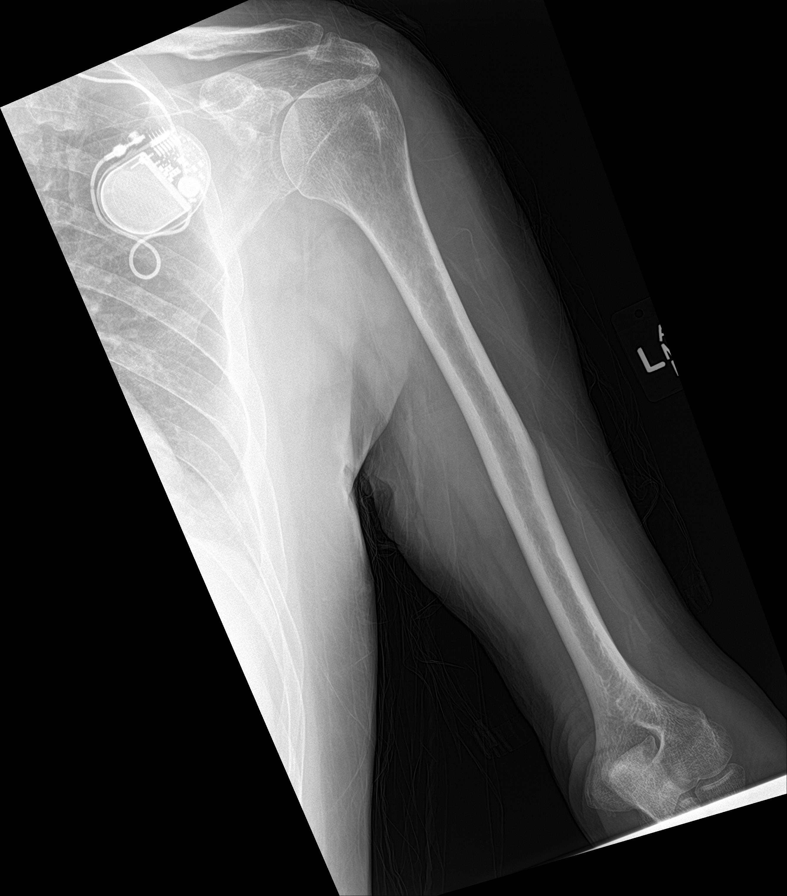

[forearm ap (1 of 2)]
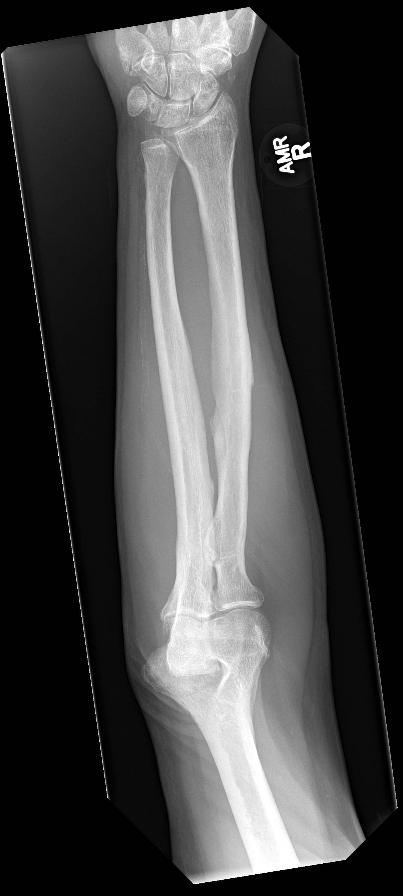

[forearm ap (2 of 2)]
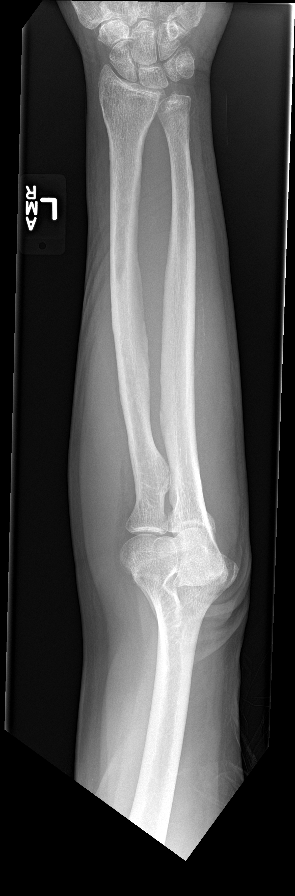

[c-spine ap]
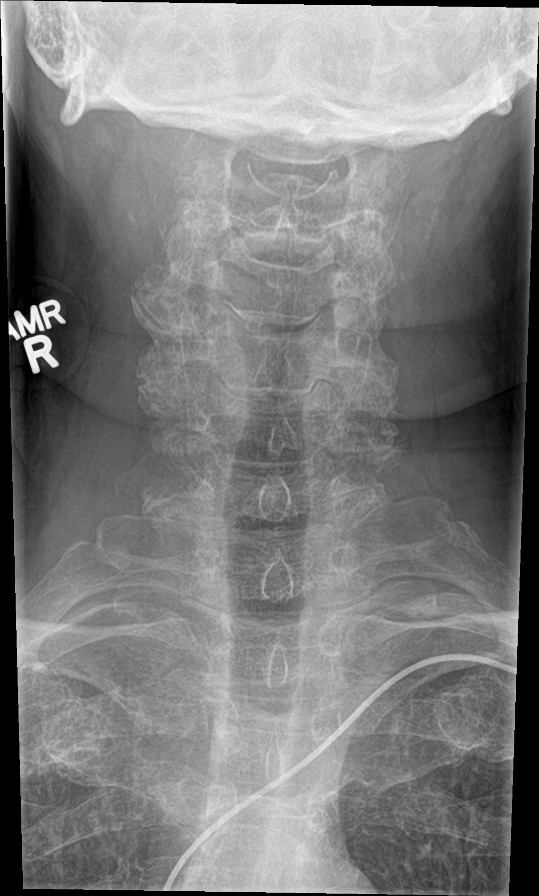

[c-spine lat]
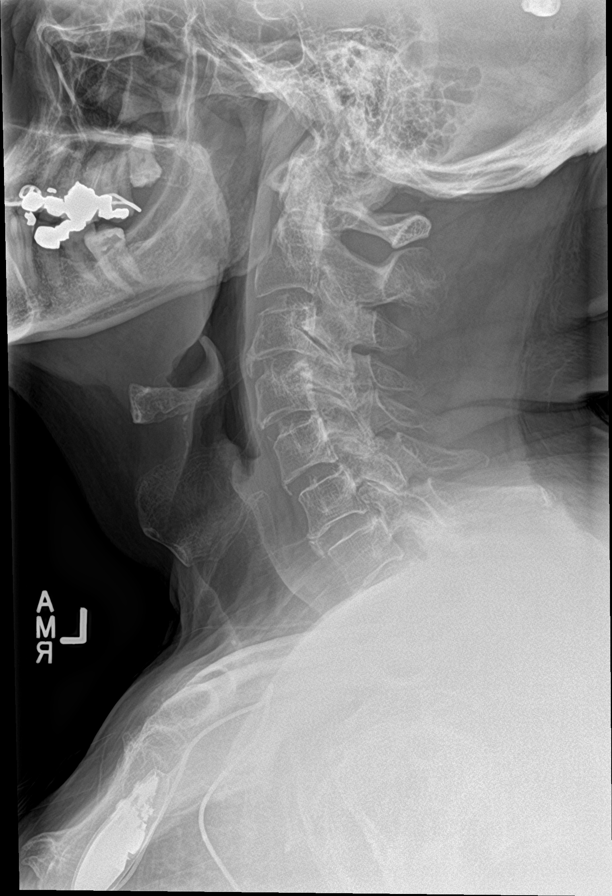

[9 of 10 positions shown; findings below may reference images not displayed]

FINDINGS: Lateral view of the skull demonstrates no lytic or sclerotic lesion.
A metallic foreign body is noted over the posterior skull which may
be related to the patient's given clinical history of cochlear
implant.

The shoulders are well visualized bilaterally with bilateral
acromioclavicular degenerative change. No acute fracture is seen. No
lytic or sclerotic lesions are noted.

The upper extremities are well visualized bilaterally. Some mild
degenerative changes are noted. No definitive lytic or sclerotic
lesions are noted.

Cervical spine demonstrates multilevel facet hypertrophic changes
and osteophytic changes. Carotid calcifications are seen. No lytic
or sclerotic lesions are noted.

Thoracic spine also demonstrates multifocal osteophytic changes. No
compression deformities are seen. No lytic or sclerotic lesions are
noted. No paraspinal mass is seen.

Lumbar spine demonstrates degenerative change without compression
deformity. No definitive lytic or sclerotic lesions are seen.
Considerable amount of retained fecal material is noted. Correlation
for possible constipation is recommended.

Cardiac shadow is mildly enlarged. No definitive rib abnormality is
noted within the chest. Pacing device is seen.

The pelvis demonstrates a E normal pelvic ring. Degenerative changes
of the hip joints are seen. No definitive lytic or sclerotic lesions
are seen.

The lower extremities are well visualized bilaterally without
definitive lytic or sclerotic lesions.
IMPRESSION: No definitive lytic or sclerotic lesions to correspond with the
patient's given clinical history at this time.

Chronic findings as described above are noted.

## 2019-10-11 DIAGNOSIS — I10 Essential (primary) hypertension: Secondary | ICD-10-CM | POA: Diagnosis not present

## 2019-10-11 DIAGNOSIS — K219 Gastro-esophageal reflux disease without esophagitis: Secondary | ICD-10-CM | POA: Diagnosis not present

## 2019-10-18 DIAGNOSIS — G309 Alzheimer's disease, unspecified: Secondary | ICD-10-CM | POA: Diagnosis not present

## 2019-10-18 DIAGNOSIS — E1165 Type 2 diabetes mellitus with hyperglycemia: Secondary | ICD-10-CM | POA: Diagnosis not present

## 2019-10-18 DIAGNOSIS — Z299 Encounter for prophylactic measures, unspecified: Secondary | ICD-10-CM | POA: Diagnosis not present

## 2019-10-18 DIAGNOSIS — I4891 Unspecified atrial fibrillation: Secondary | ICD-10-CM | POA: Diagnosis not present

## 2019-11-11 DIAGNOSIS — E1165 Type 2 diabetes mellitus with hyperglycemia: Secondary | ICD-10-CM | POA: Diagnosis not present

## 2019-11-20 ENCOUNTER — Inpatient Hospital Stay (HOSPITAL_COMMUNITY): Payer: Medicare Other

## 2019-11-27 ENCOUNTER — Ambulatory Visit (HOSPITAL_COMMUNITY): Payer: Medicare Other | Admitting: Hematology

## 2019-11-28 ENCOUNTER — Ambulatory Visit (HOSPITAL_COMMUNITY)
Admission: RE | Admit: 2019-11-28 | Discharge: 2019-11-28 | Disposition: A | Discharge status: Home / Self Care | Payer: Medicare Other | Source: Ambulatory Visit | Attending: Hematology | Admitting: Hematology

## 2019-11-28 ENCOUNTER — Inpatient Hospital Stay (HOSPITAL_COMMUNITY): Payer: Medicare Other | Attending: Hematology

## 2019-11-28 ENCOUNTER — Other Ambulatory Visit: Payer: Self-pay

## 2019-11-28 DIAGNOSIS — Z801 Family history of malignant neoplasm of trachea, bronchus and lung: Secondary | ICD-10-CM | POA: Insufficient documentation

## 2019-11-28 DIAGNOSIS — D696 Thrombocytopenia, unspecified: Secondary | ICD-10-CM | POA: Diagnosis not present

## 2019-11-28 DIAGNOSIS — Z7901 Long term (current) use of anticoagulants: Secondary | ICD-10-CM | POA: Diagnosis not present

## 2019-11-28 DIAGNOSIS — Z836 Family history of other diseases of the respiratory system: Secondary | ICD-10-CM | POA: Diagnosis not present

## 2019-11-28 DIAGNOSIS — Z8719 Personal history of other diseases of the digestive system: Secondary | ICD-10-CM | POA: Insufficient documentation

## 2019-11-28 DIAGNOSIS — Z79899 Other long term (current) drug therapy: Secondary | ICD-10-CM | POA: Insufficient documentation

## 2019-11-28 DIAGNOSIS — R5383 Other fatigue: Secondary | ICD-10-CM | POA: Diagnosis not present

## 2019-11-28 DIAGNOSIS — Z8249 Family history of ischemic heart disease and other diseases of the circulatory system: Secondary | ICD-10-CM | POA: Diagnosis not present

## 2019-11-28 DIAGNOSIS — K5900 Constipation, unspecified: Secondary | ICD-10-CM | POA: Diagnosis not present

## 2019-11-28 DIAGNOSIS — C9000 Multiple myeloma not having achieved remission: Secondary | ICD-10-CM | POA: Insufficient documentation

## 2019-11-28 DIAGNOSIS — G479 Sleep disorder, unspecified: Secondary | ICD-10-CM | POA: Diagnosis not present

## 2019-11-28 DIAGNOSIS — R197 Diarrhea, unspecified: Secondary | ICD-10-CM | POA: Diagnosis not present

## 2019-11-28 DIAGNOSIS — F329 Major depressive disorder, single episode, unspecified: Secondary | ICD-10-CM | POA: Diagnosis not present

## 2019-11-28 DIAGNOSIS — R079 Chest pain, unspecified: Secondary | ICD-10-CM | POA: Insufficient documentation

## 2019-11-28 DIAGNOSIS — Z882 Allergy status to sulfonamides status: Secondary | ICD-10-CM | POA: Insufficient documentation

## 2019-11-28 DIAGNOSIS — D472 Monoclonal gammopathy: Secondary | ICD-10-CM

## 2019-11-28 DIAGNOSIS — I4891 Unspecified atrial fibrillation: Secondary | ICD-10-CM | POA: Diagnosis not present

## 2019-11-28 DIAGNOSIS — D539 Nutritional anemia, unspecified: Secondary | ICD-10-CM | POA: Insufficient documentation

## 2019-11-28 DIAGNOSIS — R42 Dizziness and giddiness: Secondary | ICD-10-CM | POA: Diagnosis not present

## 2019-11-28 LAB — CBC WITH DIFFERENTIAL/PLATELET
Abs Immature Granulocytes: 0.01 10*3/uL (ref 0.00–0.07)
Basophils Absolute: 0 10*3/uL (ref 0.0–0.1)
Basophils Relative: 1 %
Eosinophils Absolute: 0.1 10*3/uL (ref 0.0–0.5)
Eosinophils Relative: 3 %
HCT: 36 % — ABNORMAL LOW (ref 39.0–52.0)
Hemoglobin: 12.1 g/dL — ABNORMAL LOW (ref 13.0–17.0)
Immature Granulocytes: 0 %
Lymphocytes Relative: 24 %
Lymphs Abs: 0.8 10*3/uL (ref 0.7–4.0)
MCH: 36.3 pg — ABNORMAL HIGH (ref 26.0–34.0)
MCHC: 33.6 g/dL (ref 30.0–36.0)
MCV: 108.1 fL — ABNORMAL HIGH (ref 80.0–100.0)
Monocytes Absolute: 0.3 10*3/uL (ref 0.1–1.0)
Monocytes Relative: 9 %
Neutro Abs: 2.2 10*3/uL (ref 1.7–7.7)
Neutrophils Relative %: 63 %
Platelets: 88 10*3/uL — ABNORMAL LOW (ref 150–400)
RBC: 3.33 MIL/uL — ABNORMAL LOW (ref 4.22–5.81)
RDW: 13.5 % (ref 11.5–15.5)
WBC: 3.4 10*3/uL — ABNORMAL LOW (ref 4.0–10.5)
nRBC: 0 % (ref 0.0–0.2)

## 2019-11-28 LAB — COMPREHENSIVE METABOLIC PANEL
ALT: 34 U/L (ref 0–44)
AST: 35 U/L (ref 15–41)
Albumin: 3.9 g/dL (ref 3.5–5.0)
Alkaline Phosphatase: 93 U/L (ref 38–126)
Anion gap: 7 (ref 5–15)
BUN: 26 mg/dL — ABNORMAL HIGH (ref 8–23)
CO2: 22 mmol/L (ref 22–32)
Calcium: 9.4 mg/dL (ref 8.9–10.3)
Chloride: 104 mmol/L (ref 98–111)
Creatinine, Ser: 1.11 mg/dL (ref 0.61–1.24)
GFR calc Af Amer: >60 mL/min (ref >60)
GFR calc non Af Amer: >60 mL/min (ref >60)
Glucose, Bld: 257 mg/dL — ABNORMAL HIGH (ref 70–99)
Potassium: 4.6 mmol/L (ref 3.5–5.1)
Sodium: 133 mmol/L — ABNORMAL LOW (ref 135–145)
Total Bilirubin: 1 mg/dL (ref 0.3–1.2)
Total Protein: 7.7 g/dL (ref 6.5–8.1)

## 2019-11-28 LAB — LACTATE DEHYDROGENASE: LDH: 133 U/L (ref 98–192)

## 2019-11-29 LAB — PROTEIN ELECTROPHORESIS, SERUM
A/G Ratio: 1.1 (ref 0.7–1.7)
Albumin ELP: 3.8 g/dL (ref 2.9–4.4)
Alpha-1-Globulin: 0.2 g/dL (ref 0.0–0.4)
Alpha-2-Globulin: 0.6 g/dL (ref 0.4–1.0)
Beta Globulin: 0.8 g/dL (ref 0.7–1.3)
Gamma Globulin: 1.9 g/dL — ABNORMAL HIGH (ref 0.4–1.8)
Globulin, Total: 3.5 g/dL (ref 2.2–3.9)
M-Spike, %: 1.4 g/dL — ABNORMAL HIGH
Total Protein ELP: 7.3 g/dL (ref 6.0–8.5)

## 2019-11-29 LAB — KAPPA/LAMBDA LIGHT CHAINS
Kappa free light chain: 70.8 mg/L — ABNORMAL HIGH (ref 3.3–19.4)
Kappa, lambda light chain ratio: 3.71 — ABNORMAL HIGH (ref 0.26–1.65)
Lambda free light chains: 19.1 mg/L (ref 5.7–26.3)

## 2019-12-05 ENCOUNTER — Inpatient Hospital Stay (HOSPITAL_COMMUNITY): Payer: Medicare Other | Admitting: Hematology

## 2019-12-05 ENCOUNTER — Other Ambulatory Visit: Payer: Self-pay

## 2019-12-05 VITALS — BP 137/57 | HR 70 | Temp 96.9°F | Resp 18 | Wt 198.6 lb

## 2019-12-05 DIAGNOSIS — Z801 Family history of malignant neoplasm of trachea, bronchus and lung: Secondary | ICD-10-CM | POA: Diagnosis not present

## 2019-12-05 DIAGNOSIS — G479 Sleep disorder, unspecified: Secondary | ICD-10-CM | POA: Diagnosis not present

## 2019-12-05 DIAGNOSIS — D472 Monoclonal gammopathy: Secondary | ICD-10-CM

## 2019-12-05 DIAGNOSIS — Z882 Allergy status to sulfonamides status: Secondary | ICD-10-CM | POA: Diagnosis not present

## 2019-12-05 DIAGNOSIS — D539 Nutritional anemia, unspecified: Secondary | ICD-10-CM

## 2019-12-05 DIAGNOSIS — R079 Chest pain, unspecified: Secondary | ICD-10-CM | POA: Diagnosis not present

## 2019-12-05 DIAGNOSIS — Z79899 Other long term (current) drug therapy: Secondary | ICD-10-CM | POA: Diagnosis not present

## 2019-12-05 DIAGNOSIS — I4891 Unspecified atrial fibrillation: Secondary | ICD-10-CM | POA: Diagnosis not present

## 2019-12-05 DIAGNOSIS — D696 Thrombocytopenia, unspecified: Secondary | ICD-10-CM

## 2019-12-05 DIAGNOSIS — Z7901 Long term (current) use of anticoagulants: Secondary | ICD-10-CM | POA: Diagnosis not present

## 2019-12-05 DIAGNOSIS — Z8249 Family history of ischemic heart disease and other diseases of the circulatory system: Secondary | ICD-10-CM | POA: Diagnosis not present

## 2019-12-05 DIAGNOSIS — C9000 Multiple myeloma not having achieved remission: Secondary | ICD-10-CM

## 2019-12-05 DIAGNOSIS — Z836 Family history of other diseases of the respiratory system: Secondary | ICD-10-CM | POA: Diagnosis not present

## 2019-12-05 DIAGNOSIS — K5900 Constipation, unspecified: Secondary | ICD-10-CM | POA: Diagnosis not present

## 2019-12-05 DIAGNOSIS — R197 Diarrhea, unspecified: Secondary | ICD-10-CM | POA: Diagnosis not present

## 2019-12-05 DIAGNOSIS — Z8719 Personal history of other diseases of the digestive system: Secondary | ICD-10-CM | POA: Diagnosis not present

## 2019-12-05 DIAGNOSIS — R42 Dizziness and giddiness: Secondary | ICD-10-CM | POA: Diagnosis not present

## 2019-12-05 DIAGNOSIS — R5383 Other fatigue: Secondary | ICD-10-CM | POA: Diagnosis not present

## 2019-12-05 NOTE — Patient Instructions (Signed)
[facility] at [facility] Discharge Instructions  You were seen today by Dr. Katragadda. He went over your recent results and scans. Dr. Katragadda will see you back in 4 months for labs and follow up.   Thank you for choosing  [facility] at [facility] to provide your oncology and hematology care.  To afford each patient quality time with our provider, please arrive at least 15 minutes before your scheduled appointment time.   If you have a lab appointment with the Cancer Center please come in thru the Main Entrance and check in at the main information desk  You need to re-schedule your appointment should you arrive 10 or more minutes late.  We strive to give you quality time with our providers, and arriving late affects you and other patients whose appointments are after yours.  Also, if you no show three or more times for appointments you may be dismissed from the clinic at the providers discretion.     Again, thank you for choosing [facility].  Our hope is that these requests will decrease the amount of time that you wait before being seen by our physicians.       _____________________________________________________________  Should you have questions after your visit to [facility], please contact our office at [phone] between the hours of 8:00 a.m. and 4:30 p.m.  Voicemails left after 4:00 p.m. will not be returned until the following business day.  For prescription refill requests, have your pharmacy contact our office and allow 72 hours.    Cancer Center Support Programs:   > Cancer Support Group  2nd Tuesday of the month 1pm-2pm, Journey Room   

## 2019-12-05 NOTE — Progress Notes (Signed)
Jay Campbell, [place] [address]   CLINIC:  Medical Oncology/Hematology  PCP:  Monico Blitz, MD [address] [place] [address]  [phone]  REASON FOR VISIT:  Follow-up for smoldering multiple myeloma, macrocytic anemia and thrombocytopenia  PRIOR THERAPY: None  CURRENT THERAPY: Observation  INTERVAL HISTORY:  Jay Campbell, a 74 y.o. male, returns for routine follow-up for his smoldering multiple myeloma. Jay Campbell was last seen on 07/16/2019.  Today he is accompanied by his husband. He reports feeling well and denies any issues since the last visit. He denies any recent infections, F/C, night sweats, nosebleeds or bone pains. He has not started any new meds or antibiotics.  His wife and him were both sick with COVID in March.   REVIEW OF SYSTEMS:  Review of Systems  Constitutional: Positive for appetite change (mildly decreased) and fatigue (moderate). Negative for chills, diaphoresis and fever.  Cardiovascular: Positive for chest pain (occasional).  Gastrointestinal: Positive for constipation (occasional) and diarrhea (occasional).  Musculoskeletal: Negative for arthralgias.  Neurological: Positive for dizziness.  Psychiatric/Behavioral: Positive for depression (occasional) and sleep disturbance. The patient is nervous/anxious (occasional).   All other systems reviewed and are negative.   PAST MEDICAL/SURGICAL HISTORY:  Past Medical History:  Diagnosis Date  . Anxiety   . Atrial fibrillation ([place])    Permanent  . Coronary artery disease    Mild nonobstructive 1/12  . DM (diabetes mellitus) ([place])   . GERD (gastroesophageal reflux disease)   . Gout   . Hearing disorder, cochlear   . Hiatal hernia   . HLD (hyperlipidemia)   . HTN (hypertension)   . S/P AV nodal ablation    s/p SJM PPM; gen change 02-01-13 by Dr Rayann Heman  . Smoldering multiple myeloma ([place]) 12/29/2017  . Warfarin anticoagulation    Past Surgical History:   Procedure Laterality Date  . CATARACT EXTRACTION Right   . COCHLEAR IMPLANT Right   . HEMORRHOID BANDING    . KNEE ARTHROSCOPY Right 1992   Dr Luna Glasgow   . PACEMAKER GENERATOR CHANGE Bilateral 02/01/2013   Procedure: PACEMAKER GENERATOR CHANGE;  Surgeon: Coralyn Mark, MD;  Location: [facility];  Service: Cardiovascular;  Laterality: Bilateral;  . PACEMAKER PLACEMENT  2002; 2014   SJM implanted by Dr Lovena Le with AV nodal ablation performed; gen change to [employer] SR RF by Dr Rayann Heman 02-01-13  . RETINAL LASER PROCEDURE Left   . SLT LASER APPLICATION Left 12/14/7251   Procedure: SLT LASER APPLICATION;  Surgeon: Williams Che, MD;  Location: [facility];  Service: Ophthalmology;  Laterality: Left;    SOCIAL HISTORY:  Social History   Socioeconomic History  . Marital status: Married    Spouse name: Not on file  . Number of children: Not on file  . Years of education: Not on file  . Highest education level: Not on file  Occupational History  . Not on file  Tobacco Use  . Smoking status: Never Smoker  . Smokeless tobacco: Never Used  Vaping Use  . Vaping Use: Never used  Substance and Sexual Activity  . Alcohol use: No    Alcohol/week: 0.0 standard drinks  . Drug use: No  . Sexual activity: Not on file  Other Topics Concern  . Not on file  Social History Narrative  . Not on file   Social Determinants of Health   Financial Resource Strain:   . Difficulty of Paying Living Expenses: Not on file  Food Insecurity:   . Worried About Charity fundraiser in the Last Year: Not on file  . Ran Out of Food in the Last Year: Not on file  Transportation Needs:   . Lack of Transportation (Medical): Not on file  . Lack of Transportation (Non-Medical): Not on file  Physical Activity:   . Days of Exercise per Week: Not on file  . Minutes of Exercise per Session: Not on file  Stress:   . Feeling of Stress : Not on file  Social Connections:   . Frequency of Communication with Friends and  Family: Not on file  . Frequency of Social Gatherings with Friends and Family: Not on file  . Attends Religious Services: Not on file  . Active Member of Clubs or Organizations: Not on file  . Attends Archivist Meetings: Not on file  . Marital Status: Not on file  Intimate Partner Violence:   . Fear of Current or Ex-Partner: Not on file  . Emotionally Abused: Not on file  . Physically Abused: Not on file  . Sexually Abused: Not on file    FAMILY HISTORY:  Family History  Problem Relation Age of Onset  . COPD Mother   . Emphysema Mother   . Cancer - Lung Father   . Heart disease Paternal Uncle   . Stomach cancer Neg Hx   . Colon cancer Neg Hx     CURRENT MEDICATIONS:  Current Outpatient Medications  Medication Sig Dispense Refill  . allopurinol (ZYLOPRIM) 100 MG tablet Take 100 mg by mouth Daily.     . Ascorbic Acid 500 MG TBCR Take 1 tablet by mouth daily.    . BD INSULIN SYRINGE U/F 31G X 5/16" 0.3 ML MISC     . cholecalciferol (VITAMIN D3) 25 MCG (1000 UNIT) tablet Take 1,000 Units by mouth daily.    . clonazePAM (KLONOPIN) 0.5 MG tablet Take 0.5 mg by mouth as needed.     . colchicine 0.6 MG tablet Take 0.6 mg by mouth as needed.     Marland Kitchen ELIQUIS 5 MG TABS tablet Take 5 mg by mouth 2 (two) times daily.    . famotidine (PEPCID) 20 MG tablet Take 20 mg by mouth 2 (two) times daily.    . fish oil-omega-3 fatty acids 1000 MG capsule Take 1 g by mouth 2 (two) times daily.      Marland Kitchen HUMULIN 70/30 (70-30) 100 UNIT/ML injection Inject 30 Units into the skin 2 (two) times daily with a meal.     . isosorbide mononitrate (IMDUR) 60 MG 24 hr tablet Take 120 mg by mouth daily.    Marland Kitchen lisinopril (ZESTRIL) 10 MG tablet Take 10 mg by mouth every evening.     . memantine (NAMENDA) 10 MG tablet Take 10 mg by mouth 2 (two) times daily.    . metFORMIN (GLUCOPHAGE) 1000 MG tablet Take 1,000 mg by mouth 2 (two) times daily with a meal.      . metoprolol succinate (TOPROL-XL) 25 MG 24 hr  tablet Take 1 tablet (25 mg total) by mouth daily. 90 tablet 3  . ONETOUCH VERIO test strip     . sertraline (ZOLOFT) 100 MG tablet Take 100 mg by mouth daily.    . simvastatin (ZOCOR) 40 MG tablet Take 40 mg by mouth at bedtime.      . Tamsulosin HCl (FLOMAX PO) Take 20 mg by mouth daily.     . nitroGLYCERIN (NITROSTAT) 0.4 MG SL tablet  Place 1 tablet (0.4 mg total) under the tongue every 5 (five) minutes x 3 doses as needed. (Patient not taking: Reported on 12/05/2019) 25 tablet 3   No current facility-administered medications for this visit.    ALLERGIES:  Allergies  Allergen Reactions  . Contrast Media [Iodinated Diagnostic Agents]   . Shellfish Allergy Itching and Rash  . Sulfonamide Derivatives Itching and Rash    PHYSICAL EXAM:  Performance status (ECOG): 1 - Symptomatic but completely ambulatory  Vitals:   12/05/19 1457  BP: (!) 137/57  Pulse: 70  Resp: 18  Temp: (!) 96.9 F (36.1 C)  SpO2: 100%   Wt Readings from Last 3 Encounters:  12/05/19 198 lb 9.6 oz (90.1 kg)  07/16/19 196 lb 9.6 oz (89.2 kg)  04/19/19 190 lb 12.8 oz (86.5 kg)   Physical Exam Vitals reviewed.  Constitutional:      Appearance: Normal appearance.  Cardiovascular:     Rate and Rhythm: Normal rate and regular rhythm.     Pulses: Normal pulses.     Heart sounds: Normal heart sounds.  Pulmonary:     Effort: Pulmonary effort is normal.     Breath sounds: Normal breath sounds.  Abdominal:     Palpations: Abdomen is soft. There is no hepatomegaly, splenomegaly or mass.     Tenderness: There is no abdominal tenderness.     Hernia: No hernia is present.  Lymphadenopathy:     Cervical: No cervical adenopathy.     Upper Body:     Right upper body: No supraclavicular, axillary or pectoral adenopathy.     Left upper body: No supraclavicular, axillary or pectoral adenopathy.     Lower Body: No right inguinal adenopathy. No left inguinal adenopathy.  Neurological:     General: No focal deficit  present.     Mental Status: He is alert and oriented to person, place, and time.  Psychiatric:        Mood and Affect: Mood normal.        Behavior: Behavior normal.     LABORATORY DATA:  I have reviewed the labs as listed.  CBC Latest Ref Rng & Units 11/28/2019 07/10/2019 03/21/2019  WBC 4.0 - 10.5 K/uL 3.4(L) 3.5(L) 4.0  Hemoglobin 13.0 - 17.0 g/dL 12.1(L) 12.0(L) 12.6(L)  Hematocrit 39 - 52 % 36.0(L) 36.0(L) 36.8(L)  Platelets 150 - 400 K/uL 88(L) 99(L) 95(L)   CMP Latest Ref Rng & Units 11/28/2019 07/10/2019 03/21/2019  Glucose 70 - 99 mg/dL 257(H) 161(H) 213(H)  BUN 8 - 23 mg/dL 26(H) 17 31(H)  Creatinine 0.61 - 1.24 mg/dL 1.11 1.00 1.21  Sodium 135 - 145 mmol/L 133(L) 137 136  Potassium 3.5 - 5.1 mmol/L 4.6 4.9 4.6  Chloride 98 - 111 mmol/L 104 102 104  CO2 22 - 32 mmol/L '22 25 24  ' Calcium 8.9 - 10.3 mg/dL 9.4 9.0 9.2  Total Protein 6.5 - 8.1 g/dL 7.7 7.3 7.9  Total Bilirubin 0.3 - 1.2 mg/dL 1.0 1.4(H) 1.1  Alkaline Phos 38 - 126 U/L 93 103 90  AST 15 - 41 U/L 35 28 27  ALT 0 - 44 U/L 34 23 22      Component Value Date/Time   RBC 3.33 (L) 11/28/2019 1109   MCV 108.1 (H) 11/28/2019 1109   MCH 36.3 (H) 11/28/2019 1109   MCHC 33.6 11/28/2019 1109   RDW 13.5 11/28/2019 1109   LYMPHSABS 0.8 11/28/2019 1109   MONOABS 0.3 11/28/2019 1109   EOSABS 0.1 11/28/2019 1109  BASOSABS 0.0 11/28/2019 1109   Lab Results  Component Value Date   LDH 133 11/28/2019   LDH 157 07/10/2019   LDH 115 03/21/2019   Lab Results  Component Value Date   TOTALPROTELP 7.3 11/28/2019   ALBUMINELP 3.8 11/28/2019   A1GS 0.2 11/28/2019   A2GS 0.6 11/28/2019   BETS 0.8 11/28/2019   GAMS 1.9 (H) 11/28/2019   MSPIKE 1.4 (H) 11/28/2019   SPEI Comment 11/28/2019    Lab Results  Component Value Date   KPAFRELGTCHN 70.8 (H) 11/28/2019   LAMBDASER 19.1 11/28/2019   KAPLAMBRATIO 3.71 (H) 11/28/2019    DIAGNOSTIC IMAGING:  I have independently reviewed the scans and discussed with the  patient. DG Bone Survey Met  Result Date: 11/29/2019 CLINICAL DATA:  Myeloma EXAM: METASTATIC BONE SURVEY COMPARISON:  08/23/2018 FINDINGS: No focal lytic lesion within the visualized bony skeleton. Diffuse degenerative disc and facet disease throughout the spine. Left pacer in place with tip in the right ventricle. Heart is borderline in size. Lungs are clear. Degenerative changes in the shoulders, hips, knees. Vascular calcifications.  No visible aortic aneurysm. IMPRESSION: No acute bony abnormality or focal lytic lesion. Electronically Signed   By: Rolm Baptise M.D.   On: 11/29/2019 05:03     ASSESSMENT:  1.  IgA kappa smoldering myeloma: -Bone marrow biopsy on 09/05/2017 shows 12% plasma cells. -Skeletal survey on 11/29/2019 was negative for lytic lesions.  2.  Macrocytic anemia: -Hemoglobin is 12 and hematocrit is 36.  MCV is 107.  Previous work-up for E82, folic acid was normal.  3.  Thrombocytopenia: -He has mild to moderate thrombocytopenia.  Platelet count is 99. -CT of the abdomen and pelvis on 04/11/2017 showed normal spleen. -Bone marrow biopsy showed normal megakaryocytes.  Most likely etiology is ITP.   PLAN:  1.  IgA kappa smoldering myeloma: -He does not have any new onset bone pains. -We reviewed the skeletal survey which was negative. -Myeloma labs showed M spike 1.4 g.  Kappa light chain was 70 and ratio of 3.71. -Hemoglobin is 12.1, creatinine 1.1 and calcium normal. -Repeat labs in 4 months.  2.  Macrocytic anemia: -Hemoglobin is stable at 12.1 with MCV 108.  3.  Thrombocytopenia: -Platelet count is 88.  Denies any easy bruising or bleeding.   Orders placed this encounter:  No orders of the defined types were placed in this encounter.    Derek Jack, MD [place] [phone]   I, Milinda Antis, am acting as a scribe for Dr. Sanda Linger.  I, Derek Jack MD, have reviewed the above documentation for accuracy  and completeness, and I agree with the above.

## 2019-12-18 DIAGNOSIS — Z0000 Encounter for general adult medical examination without abnormal findings: Secondary | ICD-10-CM | POA: Diagnosis not present

## 2019-12-18 DIAGNOSIS — Z79899 Other long term (current) drug therapy: Secondary | ICD-10-CM | POA: Diagnosis not present

## 2019-12-18 DIAGNOSIS — Z7189 Other specified counseling: Secondary | ICD-10-CM | POA: Diagnosis not present

## 2019-12-18 DIAGNOSIS — E7800 Pure hypercholesterolemia, unspecified: Secondary | ICD-10-CM | POA: Diagnosis not present

## 2019-12-18 DIAGNOSIS — I10 Essential (primary) hypertension: Secondary | ICD-10-CM | POA: Diagnosis not present

## 2019-12-18 DIAGNOSIS — E1165 Type 2 diabetes mellitus with hyperglycemia: Secondary | ICD-10-CM | POA: Diagnosis not present

## 2019-12-18 DIAGNOSIS — Z299 Encounter for prophylactic measures, unspecified: Secondary | ICD-10-CM | POA: Diagnosis not present

## 2019-12-18 DIAGNOSIS — R5383 Other fatigue: Secondary | ICD-10-CM | POA: Diagnosis not present

## 2019-12-31 ENCOUNTER — Other Ambulatory Visit: Payer: Self-pay | Admitting: Neurology

## 2020-01-01 DIAGNOSIS — Z23 Encounter for immunization: Secondary | ICD-10-CM | POA: Diagnosis not present

## 2020-01-04 ENCOUNTER — Other Ambulatory Visit: Payer: Self-pay | Admitting: Neurology

## 2020-01-10 DIAGNOSIS — E7849 Other hyperlipidemia: Secondary | ICD-10-CM | POA: Diagnosis not present

## 2020-01-10 DIAGNOSIS — E1122 Type 2 diabetes mellitus with diabetic chronic kidney disease: Secondary | ICD-10-CM | POA: Diagnosis not present

## 2020-01-10 DIAGNOSIS — N183 Chronic kidney disease, stage 3 unspecified: Secondary | ICD-10-CM | POA: Diagnosis not present

## 2020-01-10 DIAGNOSIS — I129 Hypertensive chronic kidney disease with stage 1 through stage 4 chronic kidney disease, or unspecified chronic kidney disease: Secondary | ICD-10-CM | POA: Diagnosis not present

## 2020-01-13 ENCOUNTER — Ambulatory Visit: Payer: Medicare Other | Admitting: Adult Health

## 2020-01-13 ENCOUNTER — Encounter: Payer: Self-pay | Admitting: Adult Health

## 2020-01-13 VITALS — BP 118/62 | HR 60 | Ht 72.0 in | Wt 200.0 lb

## 2020-01-13 DIAGNOSIS — F0280 Dementia in other diseases classified elsewhere without behavioral disturbance: Secondary | ICD-10-CM

## 2020-01-13 DIAGNOSIS — G301 Alzheimer's disease with late onset: Secondary | ICD-10-CM

## 2020-01-13 MED ORDER — MEMANTINE HCL 10 MG PO TABS
10.0000 mg | ORAL_TABLET | Freq: Two times a day (BID) | ORAL | 3 refills | Status: DC
Start: 2020-01-13 — End: 2020-01-30

## 2020-01-13 NOTE — Progress Notes (Signed)
[facility] [address] [place]. [place] [address] [phone]       OFFICE CONSULT NOTE  [name] Date of Birth:  11/09/1945 Medical Record Number:  [id number]   Referring MD: Monico Blitz  Reason for Referral: Dementia  Chief Complaint  Patient presents with  . Follow-up    Alzheimers, room 9, with wife       HPI:   Today, 01/13/2020, Jay Campbell returns for follow-up accompanied by his wife regarding likely Alzheimer's dementia worsening after a fall in 09/2018.  Previously seen by Dr. Leonie Campbell for initial visit on 12/05/2018 and recommended 24-monthfollow-up but has not had a follow-up until this time.  Demential panel obtained after prior visit which was within normal limits.  EEG showed mild generalized slowing correlating with diagnosis of memory loss and dementia without evidence of seizure activity.  He has remained on Namenda 10 mg twice daily tolerating well without side effects.  Since initial visit over 1 year ago, cognition has been stable without worsening.  MMSE today 17/30 previously 20/30.  No behavioral concerns such as delusions, hallucinations, agitation or safety concerns.  No concerns at this time.    History provided for reference purposes only Initial consult visit 12/05/2018 Dr. SLeonie Campbell [name] 74year old Caucasian male seen today for initial office consultation visit dementia and confusion following recent head injury.  History is obtained from the patient and his wife who is accompanying him as well as review of referral notes and have personally reviewed imaging films in PACS.  He has a past medical history of diabetes, hypertension, hyperlipidemia lipidemia, paroxysmal A. fib on long-term warfarin, CHF, pacemaker placement and depression.  Patient wife states that he is had longstanding history of greater than a year of mild cognitive impairment and suspected dementia which was not never formally diagnosed with he was doing well  at home and was compensating.  He had a fall in July 2020 when he hit his head and fell to the ground and dislodged his cochlear implant from his right mastoid region.  He was confused weak and disoriented.  [facility]  CT scan of the head on admission on 10/11/2018 showed no acute abnormality.  Repeat CT scan on 10/15/2018 also showed no acute abnormality.  MRI could not be obtained since he has a pacemaker.  Admission labs unremarkable CBC and complete metabolic panel labs were normal.  UA showed no infection.  Patient's cognitive status and confusion has never returned back to baseline following this and his wife is concerned about dementia.  Patient did not have any focal neurological deficits in the form of slurred speech, extremity weakness numbness loss of vision during this hospitalization.  Patient primary physician felt that he likely had a stroke even though it was not seen on CT scan.  Patient did have some headaches after this episode which appear to have improved however his confusion and cognitive decline have persisted.  Patient does not have any family history of Alzheimer's.  There is no prior history of strokes, seizures, loss of consciousness or major head injury with loss of consciousness.  Patient has never had any lab work for reversible causes of dementia or a trial of medication like Aricept or Namenda.  He continues to have poor appetite and his intake is not good though his wife has convinced him to take Glucerna nutritional supplement.  There are no delusions, hallucinations, agitations, unsafe behavior.  He  can walk independently and has not had recurrent falls.   ROS:   14 system review of systems is positive for those listed in HPI and all other systems negative  PMH:  Past Medical History:  Diagnosis Date  . Anxiety   . Atrial fibrillation ([place])    Permanent  . Coronary artery disease    Mild nonobstructive 1/12  . DM (diabetes  mellitus) ([place])   . GERD (gastroesophageal reflux disease)   . Gout   . Hearing disorder, cochlear   . Hiatal hernia   . HLD (hyperlipidemia)   . HTN (hypertension)   . S/P AV nodal ablation    s/p SJM PPM; gen change 02-01-13 by Dr Rayann Heman  . Smoldering multiple myeloma ([place]) 12/29/2017  . Warfarin anticoagulation     Social History:  Social History   Socioeconomic History  . Marital status: Married    Spouse name: Not on file  . Number of children: Not on file  . Years of education: Not on file  . Highest education level: Not on file  Occupational History  . Not on file  Tobacco Use  . Smoking status: Never Smoker  . Smokeless tobacco: Never Used  Vaping Use  . Vaping Use: Never used  Substance and Sexual Activity  . Alcohol use: No    Alcohol/week: 0.0 standard drinks  . Drug use: No  . Sexual activity: Not on file  Other Topics Concern  . Not on file  Social History Narrative  . Not on file   Social Determinants of Health   Financial Resource Strain:   . Difficulty of Paying Living Expenses: Not on file  Food Insecurity:   . Worried About Charity fundraiser in the Last Year: Not on file  . Ran Out of Food in the Last Year: Not on file  Transportation Needs:   . Lack of Transportation (Medical): Not on file  . Lack of Transportation (Non-Medical): Not on file  Physical Activity:   . Days of Exercise per Week: Not on file  . Minutes of Exercise per Session: Not on file  Stress:   . Feeling of Stress : Not on file  Social Connections:   . Frequency of Communication with Friends and Family: Not on file  . Frequency of Social Gatherings with Friends and Family: Not on file  . Attends Religious Services: Not on file  . Active Member of Clubs or Organizations: Not on file  . Attends Archivist Meetings: Not on file  . Marital Status: Not on file  Intimate Partner Violence:   . Fear of Current or Ex-Partner: Not on file  . Emotionally Abused: Not  on file  . Physically Abused: Not on file  . Sexually Abused: Not on file    Medications:   Current Outpatient Medications on File Prior to Visit  Medication Sig Dispense Refill  . allopurinol (ZYLOPRIM) 100 MG tablet Take 100 mg by mouth Daily.     . Ascorbic Acid 500 MG TBCR Take 1 tablet by mouth daily.    . BD INSULIN SYRINGE U/F 31G X 5/16" 0.3 ML MISC     . cholecalciferol (VITAMIN D3) 25 MCG (1000 UNIT) tablet Take 1,000 Units by mouth daily.    . clonazePAM (KLONOPIN) 0.5 MG tablet Take 0.5 mg by mouth as needed.     . colchicine 0.6 MG tablet Take 0.6 mg by mouth as needed.     Marland Kitchen ELIQUIS 5 MG TABS tablet Take  5 mg by mouth 2 (two) times daily.    . famotidine (PEPCID) 20 MG tablet Take 20 mg by mouth 2 (two) times daily.    . fish oil-omega-3 fatty acids 1000 MG capsule Take 1 g by mouth 2 (two) times daily.      Marland Kitchen HUMULIN 70/30 (70-30) 100 UNIT/ML injection Inject 30 Units into the skin 2 (two) times daily with a meal.     . isosorbide mononitrate (IMDUR) 60 MG 24 hr tablet Take 120 mg by mouth daily. 2 tabs twice a day    . lisinopril (ZESTRIL) 10 MG tablet Take 10 mg by mouth every evening.     . memantine (NAMENDA) 10 MG tablet TAKE (1) TABLET TWICE DAILY. 60 tablet 0  . metFORMIN (GLUCOPHAGE) 1000 MG tablet Take 1,000 mg by mouth 2 (two) times daily with a meal.      . metoprolol succinate (TOPROL-XL) 25 MG 24 hr tablet Take 1 tablet (25 mg total) by mouth daily. 90 tablet 3  . nitroGLYCERIN (NITROSTAT) 0.4 MG SL tablet Place 1 tablet (0.4 mg total) under the tongue every 5 (five) minutes x 3 doses as needed. 25 tablet 3  . ONETOUCH VERIO test strip     . sertraline (ZOLOFT) 100 MG tablet Take 100 mg by mouth daily.    . simvastatin (ZOCOR) 40 MG tablet Take 40 mg by mouth at bedtime.      . Tamsulosin HCl (FLOMAX PO) Take 20 mg by mouth daily.      No current facility-administered medications on file prior to visit.    Allergies:   Allergies  Allergen Reactions  .  Contrast Media [Iodinated Diagnostic Agents]   . Shellfish Allergy Itching and Rash  . Sulfonamide Derivatives Itching and Rash    Physical Exam General: well developed, well nourished pleasant elderly Caucasian male, seated, in no evident distress Head: head normocephalic and atraumatic.   Neck: supple with no carotid or supraclavicular bruits Cardiovascular: regular rate and rhythm, no murmurs Musculoskeletal: no deformity Skin:  no rash/petichiae Vascular:  Normal pulses all extremities  Neurologic Exam Mental Status: Awake and fully alert. Oriented to place and time. Recent memory impaired and remote memory intact. Attention span, concentration and fund of knowledge impaired with wife providing majority of history. Mood and affect appropriate.   MMSE - Mini Mental State Exam 01/13/2020 12/05/2018  Orientation to time 3 3  Orientation to Place 4 4  Registration 3 3  Attention/ Calculation 1 0  Recall 0 2  Language- name 2 objects 2 2  Language- repeat 0 1  Language- follow 3 step command 3 3  Language- read & follow direction 0 1  Write a sentence 1 1  Copy design 0 0  Total score 17 20   Cranial Nerves: Pupils equal, briskly reactive to light. Extraocular movements full without nystagmus. Visual fields full to confrontation. Hearing reduced significantly in the right ear.  Facial sensation intact. Face, tongue, palate moves normally and symmetrically.  Motor: Normal bulk and tone. Normal strength in all tested extremity muscles. Sensory.: intact to touch , pinprick , position and vibratory sensation.  Coordination: Rapid alternating movements normal in all extremities. Finger-to-nose and heel-to-shin performed accurately bilaterally. Gait and Station: Arises from chair without difficulty. Stance is normal. Gait demonstrates normal stride length and balance with use of walking stick. Able to heel, toe and tandem walk with mild difficulty.  Reflexes: 1+ and symmetric. Toes  downgoing.  ASSESSMENT/PLAN: 74 year old Caucasian male initially consulted 11/2018 with what appears to be mild baseline dementia with recent fall head injury and postconcussion worsening of his cognitive difficulties from Alzheimer's. Per patient and wife, he has been stable without worsening over the past year.  Dementia panel within normal limits.  EEG showed mild slowing consistent with memory loss and dementia without evidence of seizures.   -Continue Namenda 10 mg twice daily -refill provided -Continue to follow with PCP and cardiology as scheduled for monitoring management of HTN, HLD, DM and atrial fibrillation   Follow-up in 1 year or call earlier if needed   I spent 25 minutes of face-to-face and non-face-to-face time with patient and wife.  This included previsit chart review, lab review, study review, order entry, electronic health record documentation, patient education and discussion regarding Alzheimer's dementia, completion and review of MMSE, ongoing use of Namenda and answered all questions to patient and wife satisfaction   Frann Rider, AGNP-BC  [facility] [address] [place], [place] [address]  Phone [phone] Fax [phone] Note: This document was prepared with digital dictation and possible smart phrase technology. Any transcriptional errors that result from this process are unintentional.

## 2020-01-13 NOTE — Patient Instructions (Addendum)
Your Plan:  Continue Namenda 10 mg twice daily -refill provided  Follow-up in 1 year or call earlier if needed     Thank you for coming to see [place] at [facility]. I hope we have been able to provide you high quality care today.  You may receive a patient satisfaction survey over the next few weeks. We would appreciate your feedback and comments so that we may continue to improve ourselves and the health of our patients.

## 2020-01-13 NOTE — Progress Notes (Signed)
I agree with the above plan 

## 2020-01-23 DIAGNOSIS — G4730 Sleep apnea, unspecified: Secondary | ICD-10-CM | POA: Diagnosis not present

## 2020-01-30 ENCOUNTER — Other Ambulatory Visit: Payer: Self-pay | Admitting: Neurology

## 2020-02-11 DIAGNOSIS — E785 Hyperlipidemia, unspecified: Secondary | ICD-10-CM | POA: Diagnosis not present

## 2020-02-11 DIAGNOSIS — I10 Essential (primary) hypertension: Secondary | ICD-10-CM | POA: Diagnosis not present

## 2020-02-13 ENCOUNTER — Other Ambulatory Visit: Payer: Self-pay | Admitting: Internal Medicine

## 2020-02-13 DIAGNOSIS — H401234 Low-tension glaucoma, bilateral, indeterminate stage: Secondary | ICD-10-CM | POA: Diagnosis not present

## 2020-02-13 DIAGNOSIS — H524 Presbyopia: Secondary | ICD-10-CM | POA: Diagnosis not present

## 2020-02-13 DIAGNOSIS — Z961 Presence of intraocular lens: Secondary | ICD-10-CM | POA: Diagnosis not present

## 2020-02-17 DIAGNOSIS — G4730 Sleep apnea, unspecified: Secondary | ICD-10-CM | POA: Diagnosis not present

## 2020-04-08 DIAGNOSIS — E1165 Type 2 diabetes mellitus with hyperglycemia: Secondary | ICD-10-CM | POA: Diagnosis not present

## 2020-04-08 DIAGNOSIS — Z789 Other specified health status: Secondary | ICD-10-CM | POA: Diagnosis not present

## 2020-04-08 DIAGNOSIS — I10 Essential (primary) hypertension: Secondary | ICD-10-CM | POA: Diagnosis not present

## 2020-04-08 DIAGNOSIS — E1122 Type 2 diabetes mellitus with diabetic chronic kidney disease: Secondary | ICD-10-CM | POA: Diagnosis not present

## 2020-04-08 DIAGNOSIS — I509 Heart failure, unspecified: Secondary | ICD-10-CM | POA: Diagnosis not present

## 2020-04-08 DIAGNOSIS — Z299 Encounter for prophylactic measures, unspecified: Secondary | ICD-10-CM | POA: Diagnosis not present

## 2020-04-16 ENCOUNTER — Inpatient Hospital Stay (HOSPITAL_COMMUNITY): Payer: Medicare Other | Attending: Hematology

## 2020-04-16 ENCOUNTER — Other Ambulatory Visit: Payer: Self-pay

## 2020-04-16 DIAGNOSIS — I4891 Unspecified atrial fibrillation: Secondary | ICD-10-CM | POA: Insufficient documentation

## 2020-04-16 DIAGNOSIS — I10 Essential (primary) hypertension: Secondary | ICD-10-CM | POA: Diagnosis not present

## 2020-04-16 DIAGNOSIS — R5383 Other fatigue: Secondary | ICD-10-CM | POA: Diagnosis not present

## 2020-04-16 DIAGNOSIS — C9000 Multiple myeloma not having achieved remission: Secondary | ICD-10-CM | POA: Diagnosis not present

## 2020-04-16 DIAGNOSIS — R519 Headache, unspecified: Secondary | ICD-10-CM | POA: Insufficient documentation

## 2020-04-16 DIAGNOSIS — Z7901 Long term (current) use of anticoagulants: Secondary | ICD-10-CM | POA: Diagnosis not present

## 2020-04-16 DIAGNOSIS — R351 Nocturia: Secondary | ICD-10-CM | POA: Insufficient documentation

## 2020-04-16 DIAGNOSIS — D472 Monoclonal gammopathy: Secondary | ICD-10-CM

## 2020-04-16 DIAGNOSIS — Z836 Family history of other diseases of the respiratory system: Secondary | ICD-10-CM | POA: Insufficient documentation

## 2020-04-16 DIAGNOSIS — R42 Dizziness and giddiness: Secondary | ICD-10-CM | POA: Insufficient documentation

## 2020-04-16 DIAGNOSIS — D696 Thrombocytopenia, unspecified: Secondary | ICD-10-CM | POA: Diagnosis not present

## 2020-04-16 DIAGNOSIS — Z79899 Other long term (current) drug therapy: Secondary | ICD-10-CM | POA: Insufficient documentation

## 2020-04-16 DIAGNOSIS — Z882 Allergy status to sulfonamides status: Secondary | ICD-10-CM | POA: Insufficient documentation

## 2020-04-16 DIAGNOSIS — Z801 Family history of malignant neoplasm of trachea, bronchus and lung: Secondary | ICD-10-CM | POA: Insufficient documentation

## 2020-04-16 DIAGNOSIS — Z8249 Family history of ischemic heart disease and other diseases of the circulatory system: Secondary | ICD-10-CM | POA: Diagnosis not present

## 2020-04-16 DIAGNOSIS — D539 Nutritional anemia, unspecified: Secondary | ICD-10-CM | POA: Diagnosis not present

## 2020-04-16 DIAGNOSIS — Z8719 Personal history of other diseases of the digestive system: Secondary | ICD-10-CM | POA: Diagnosis not present

## 2020-04-16 LAB — COMPREHENSIVE METABOLIC PANEL
ALT: 28 U/L (ref 0–44)
AST: 32 U/L (ref 15–41)
Albumin: 3.7 g/dL (ref 3.5–5.0)
Alkaline Phosphatase: 102 U/L (ref 38–126)
Anion gap: 4 — ABNORMAL LOW (ref 5–15)
BUN: 29 mg/dL — ABNORMAL HIGH (ref 8–23)
CO2: 26 mmol/L (ref 22–32)
Calcium: 9.3 mg/dL (ref 8.9–10.3)
Chloride: 106 mmol/L (ref 98–111)
Creatinine, Ser: 1.24 mg/dL (ref 0.61–1.24)
GFR, Estimated: >60 mL/min (ref >60)
Glucose, Bld: 176 mg/dL — ABNORMAL HIGH (ref 70–99)
Potassium: 4.8 mmol/L (ref 3.5–5.1)
Sodium: 136 mmol/L (ref 135–145)
Total Bilirubin: 1.1 mg/dL (ref 0.3–1.2)
Total Protein: 7.7 g/dL (ref 6.5–8.1)

## 2020-04-16 LAB — CBC WITH DIFFERENTIAL/PLATELET
Abs Immature Granulocytes: 0.01 10*3/uL (ref 0.00–0.07)
Basophils Absolute: 0 10*3/uL (ref 0.0–0.1)
Basophils Relative: 1 %
Eosinophils Absolute: 0.1 10*3/uL (ref 0.0–0.5)
Eosinophils Relative: 3 %
HCT: 36.1 % — ABNORMAL LOW (ref 39.0–52.0)
Hemoglobin: 12.1 g/dL — ABNORMAL LOW (ref 13.0–17.0)
Immature Granulocytes: 0 %
Lymphocytes Relative: 27 %
Lymphs Abs: 1 10*3/uL (ref 0.7–4.0)
MCH: 35.7 pg — ABNORMAL HIGH (ref 26.0–34.0)
MCHC: 33.5 g/dL (ref 30.0–36.0)
MCV: 106.5 fL — ABNORMAL HIGH (ref 80.0–100.0)
Monocytes Absolute: 0.3 10*3/uL (ref 0.1–1.0)
Monocytes Relative: 9 %
Neutro Abs: 2.3 10*3/uL (ref 1.7–7.7)
Neutrophils Relative %: 60 %
Platelets: 99 10*3/uL — ABNORMAL LOW (ref 150–400)
RBC: 3.39 MIL/uL — ABNORMAL LOW (ref 4.22–5.81)
RDW: 12.7 % (ref 11.5–15.5)
WBC: 3.8 10*3/uL — ABNORMAL LOW (ref 4.0–10.5)
nRBC: 0 % (ref 0.0–0.2)

## 2020-04-16 LAB — LACTATE DEHYDROGENASE: LDH: 126 U/L (ref 98–192)

## 2020-04-17 ENCOUNTER — Ambulatory Visit: Payer: Medicare Other | Admitting: Internal Medicine

## 2020-04-17 ENCOUNTER — Encounter: Payer: Self-pay | Admitting: Internal Medicine

## 2020-04-17 VITALS — BP 130/70 | HR 65 | Ht 72.0 in | Wt 198.0 lb

## 2020-04-17 DIAGNOSIS — I4821 Permanent atrial fibrillation: Secondary | ICD-10-CM | POA: Diagnosis not present

## 2020-04-17 DIAGNOSIS — I10 Essential (primary) hypertension: Secondary | ICD-10-CM

## 2020-04-17 DIAGNOSIS — I442 Atrioventricular block, complete: Secondary | ICD-10-CM

## 2020-04-17 DIAGNOSIS — R42 Dizziness and giddiness: Secondary | ICD-10-CM | POA: Diagnosis not present

## 2020-04-17 LAB — CUP PACEART INCLINIC DEVICE CHECK
Battery Remaining Longevity: 135 mo
Battery Voltage: 2.99 V
Brady Statistic RV Percent Paced: 99.99 %
Date Time Interrogation Session: 20220204120702
Implantable Lead Implant Date: 20020215
Implantable Lead Location: 753860
Implantable Pulse Generator Implant Date: 20141121
Lead Channel Impedance Value: 612.5 Ohm
Lead Channel Pacing Threshold Amplitude: 1 V
Lead Channel Pacing Threshold Pulse Width: 0.4 ms
Lead Channel Sensing Intrinsic Amplitude: 12 mV
Lead Channel Setting Pacing Amplitude: 1.25 V
Lead Channel Setting Pacing Pulse Width: 0.4 ms
Lead Channel Setting Sensing Sensitivity: 6 mV
Pulse Gen Model: 1240
Pulse Gen Serial Number: 7488472

## 2020-04-17 LAB — KAPPA/LAMBDA LIGHT CHAINS
Kappa free light chain: 60.5 mg/L — ABNORMAL HIGH (ref 3.3–19.4)
Kappa, lambda light chain ratio: 2.62 — ABNORMAL HIGH (ref 0.26–1.65)
Lambda free light chains: 23.1 mg/L (ref 5.7–26.3)

## 2020-04-17 MED ORDER — ISOSORBIDE MONONITRATE ER 60 MG PO TB24: 60.0000 mg | ORAL_TABLET | Freq: Every day | ORAL | 6 refills | Status: DC

## 2020-04-17 NOTE — Progress Notes (Signed)
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 male who presents today for routine electrophysiology followup.  Since last being seen in our clinic, the patient reports doing very well.  He has frequent postural dizziness.  Today, he denies symptoms of palpitations, chest pain, shortness of breath,  lower extremity edema,  presyncope, or syncope.  The patient is otherwise without complaint today.   Past Medical History:  Diagnosis Date  . Anxiety   . Atrial fibrillation ([place])    Permanent  . Coronary artery disease    Mild nonobstructive 1/12  . DM (diabetes mellitus) ([place])   . GERD (gastroesophageal reflux disease)   . Gout   . Hearing disorder, cochlear   . Hiatal hernia   . HLD (hyperlipidemia)   . HTN (hypertension)   . S/P AV nodal ablation    s/p SJM PPM; gen change 02-01-13 by Dr Rayann Heman  . 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 Warfarin anticoagulation    Past Surgical History:  Procedure Laterality Date  . CATARACT EXTRACTION Right   . COCHLEAR IMPLANT Right   . HEMORRHOID BANDING    . KNEE ARTHROSCOPY Right 1992   Dr Luna Glasgow   . PACEMAKER GENERATOR CHANGE Bilateral 02/01/2013   Procedure: PACEMAKER GENERATOR CHANGE;  Surgeon: Coralyn Mark, MD;  Location: [facility];  Service: Cardiovascular;  Laterality: Bilateral;  . PACEMAKER PLACEMENT  2002; 2014   SJM implanted by Dr Lovena Le with AV nodal ablation performed; gen change to Accent SR RF by Dr Rayann Heman 02-01-13  . RETINAL LASER PROCEDURE Left   . SLT LASER APPLICATION Left 23/07/3612   Procedure: SLT LASER APPLICATION;  Surgeon: Williams Che, MD;  Location: [facility];  Service: Ophthalmology;  Laterality: Left;    ROS- all systems are reviewed and negative except as per HPI above  Current Outpatient Medications  Medication Sig Dispense Refill  . allopurinol (ZYLOPRIM) 100 MG tablet Take 100 mg by mouth Daily.     . Ascorbic Acid 500 MG TBCR Take 1 tablet by mouth  daily.    . BD INSULIN SYRINGE U/F 31G X 5/16" 0.3 ML MISC     . cholecalciferol (VITAMIN D3) 25 MCG (1000 UNIT) tablet Take 1,000 Units by mouth daily.    . colchicine 0.6 MG tablet Take 0.6 mg by mouth as needed.     Marland Kitchen ELIQUIS 5 MG TABS tablet Take 5 mg by mouth 2 (two) times daily.    . famotidine (PEPCID) 20 MG tablet Take 20 mg by mouth 2 (two) times daily.    . fish oil-omega-3 fatty acids 1000 MG capsule Take 1 g by mouth 2 (two) times daily.      Marland Kitchen HUMULIN 70/30 (70-30) 100 UNIT/ML injection Inject 30 Units into the skin 2 (two) times daily with a meal.     . isosorbide mononitrate (IMDUR) 60 MG 24 hr tablet Take 120 mg by mouth daily. 2 tabs twice a day    . lisinopril (ZESTRIL) 10 MG tablet Take 10 mg by mouth every evening.     . memantine (NAMENDA) 10 MG tablet TAKE (1) TABLET TWICE DAILY. 180 tablet 3  . metFORMIN (GLUCOPHAGE) 1000 MG tablet Take 1,000 mg by mouth 2 (two) times daily with a meal.      . metoprolol succinate (TOPROL-XL) 25 MG 24 hr tablet TAKE 1 TABLET BY MOUTH  DAILY 90 tablet 0  . nitroGLYCERIN (NITROSTAT)  0.4 MG SL tablet Place 1 tablet (0.4 mg total) under the tongue every 5 (five) minutes x 3 doses as needed. 25 tablet 3  . ONETOUCH VERIO test strip     . sertraline (ZOLOFT) 100 MG tablet Take 100 mg by mouth daily.    . simvastatin (ZOCOR) 40 MG tablet Take 40 mg by mouth at bedtime.      . Tamsulosin HCl (FLOMAX PO) Take 20 mg by mouth daily.      No current facility-administered medications for this visit.    Physical Exam: Vitals:   04/17/20 1153  Pulse: 65  SpO2: 98%  Weight: 198 lb (89.8 kg)  Height: 6' (1.829 m)    GEN- The patient is well appearing, alert and oriented x 3 today.   Head- normocephalic, atraumatic Eyes-  Sclera clear, conjunctiva pink Ears- hearing intact Oropharynx- clear Lungs-   normal work of breathing Chest- pacemaker pocket is well healed Heart- Regular rate and rhythm (paced) GI- soft  Extremities- no clubbing,  cyanosis, or edema  Pacemaker interrogation- reviewed in detail today,  See PACEART report  ekg tracing ordered today is personally reviewed and shows afib,  V paced  Assessment and Plan:  1. Symptomatic complete heart block S/p AV nodal ablation Normal pacemaker function See Pace Art report No changes today he is device dependant today  2. HTN Stable No change required today  3. Permanent afib Continue long term anticoagulation S/p AV nodal ablation  4. postural dizziness Reduce imdur from 147m bid to 649mdaily  Risks, benefits and potential toxicities for medications prescribed and/or refilled reviewed with patient today.   Return in a year  JaThompson GrayerD, [facility]/06/2020 11:54 AM

## 2020-04-17 NOTE — Addendum Note (Signed)
Addended by: Laurine Blazer on: 04/17/2020 02:14 PM   Modules accepted: Orders

## 2020-04-17 NOTE — Patient Instructions (Addendum)
Medication Instructions:   Decrease Imdur to 60mg  daily..   Continue all other medications.    Labwork: none  Testing/Procedures: none  Follow-Up: 1 year   Any Other Special Instructions Will Be Listed Below (If Applicable).  If you need a refill on your cardiac medications before your next appointment, please call your pharmacy.

## 2020-04-18 LAB — PROTEIN ELECTROPHORESIS, SERUM
A/G Ratio: 1.1 (ref 0.7–1.7)
Albumin ELP: 3.8 g/dL (ref 2.9–4.4)
Alpha-1-Globulin: 0.2 g/dL (ref 0.0–0.4)
Alpha-2-Globulin: 0.6 g/dL (ref 0.4–1.0)
Beta Globulin: 0.8 g/dL (ref 0.7–1.3)
Gamma Globulin: 2 g/dL — ABNORMAL HIGH (ref 0.4–1.8)
Globulin, Total: 3.6 g/dL (ref 2.2–3.9)
M-Spike, %: 1.5 g/dL — ABNORMAL HIGH
Total Protein ELP: 7.4 g/dL (ref 6.0–8.5)

## 2020-04-23 ENCOUNTER — Inpatient Hospital Stay (HOSPITAL_COMMUNITY): Payer: Medicare Other | Admitting: Hematology

## 2020-04-23 ENCOUNTER — Other Ambulatory Visit: Payer: Self-pay

## 2020-04-23 VITALS — BP 147/70 | HR 64 | Temp 97.1°F | Resp 18 | Wt 195.9 lb

## 2020-04-23 DIAGNOSIS — C9000 Multiple myeloma not having achieved remission: Secondary | ICD-10-CM | POA: Diagnosis not present

## 2020-04-23 DIAGNOSIS — D539 Nutritional anemia, unspecified: Secondary | ICD-10-CM | POA: Diagnosis not present

## 2020-04-23 DIAGNOSIS — Z882 Allergy status to sulfonamides status: Secondary | ICD-10-CM | POA: Diagnosis not present

## 2020-04-23 DIAGNOSIS — D696 Thrombocytopenia, unspecified: Secondary | ICD-10-CM | POA: Diagnosis not present

## 2020-04-23 DIAGNOSIS — Z7901 Long term (current) use of anticoagulants: Secondary | ICD-10-CM | POA: Diagnosis not present

## 2020-04-23 DIAGNOSIS — Z8249 Family history of ischemic heart disease and other diseases of the circulatory system: Secondary | ICD-10-CM | POA: Diagnosis not present

## 2020-04-23 DIAGNOSIS — R42 Dizziness and giddiness: Secondary | ICD-10-CM | POA: Diagnosis not present

## 2020-04-23 DIAGNOSIS — Z836 Family history of other diseases of the respiratory system: Secondary | ICD-10-CM | POA: Diagnosis not present

## 2020-04-23 DIAGNOSIS — Z8719 Personal history of other diseases of the digestive system: Secondary | ICD-10-CM | POA: Diagnosis not present

## 2020-04-23 DIAGNOSIS — Z801 Family history of malignant neoplasm of trachea, bronchus and lung: Secondary | ICD-10-CM | POA: Diagnosis not present

## 2020-04-23 DIAGNOSIS — Z79899 Other long term (current) drug therapy: Secondary | ICD-10-CM | POA: Diagnosis not present

## 2020-04-23 DIAGNOSIS — R5383 Other fatigue: Secondary | ICD-10-CM | POA: Diagnosis not present

## 2020-04-23 DIAGNOSIS — I10 Essential (primary) hypertension: Secondary | ICD-10-CM | POA: Diagnosis not present

## 2020-04-23 DIAGNOSIS — I4891 Unspecified atrial fibrillation: Secondary | ICD-10-CM | POA: Diagnosis not present

## 2020-04-23 DIAGNOSIS — R519 Headache, unspecified: Secondary | ICD-10-CM | POA: Diagnosis not present

## 2020-04-23 DIAGNOSIS — D472 Monoclonal gammopathy: Secondary | ICD-10-CM

## 2020-04-23 NOTE — Progress Notes (Signed)
[place], [place] [address]   CLINIC:  Medical Oncology/Hematology  PCP:  Monico Blitz, MD [address] / [place] [address]  [phone]  REASON FOR VISIT:  Follow-up for smoldering multiple myeloma, macrocytic anemia and thrombocytopenia  PRIOR THERAPY: None  CURRENT THERAPY: Observation  INTERVAL HISTORY:  Jay Campbell, a 75 y.o. male, returns for routine follow-up for his smoldering multiple myeloma, macrocytic anemia and thrombocytopenia. Jay Campbell was last seen on 12/05/2019.  Today he is accompanied by his wife and he reports feeling fair. He denies having any new pains, recent infections or F/C or night sweats. His main complaint is nocturia.   REVIEW OF SYSTEMS:  Review of Systems  Constitutional: Positive for appetite change (50%) and fatigue (25%). Negative for chills, diaphoresis and fever.  Genitourinary: Positive for frequency and nocturia.   Musculoskeletal: Negative for arthralgias.  Neurological: Positive for dizziness and headaches.  All other systems reviewed and are negative.   PAST MEDICAL/SURGICAL HISTORY:  Past Medical History:  Diagnosis Date  . Anxiety   . Atrial fibrillation ([place])    Permanent  . Coronary artery disease    Mild nonobstructive 1/12  . DM (diabetes mellitus) ([place])   . GERD (gastroesophageal reflux disease)   . Gout   . Hearing disorder, cochlear   . Hiatal hernia   . HLD (hyperlipidemia)   . HTN (hypertension)   . S/P AV nodal ablation    s/p SJM PPM; gen change 02-01-13 by Dr Rayann Heman  . 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 Warfarin anticoagulation    Past Surgical History:  Procedure Laterality Date  . CATARACT EXTRACTION Right   . COCHLEAR IMPLANT Right   . HEMORRHOID BANDING    . KNEE ARTHROSCOPY Right 1992   Dr Luna Glasgow   . PACEMAKER GENERATOR CHANGE Bilateral 02/01/2013   Procedure: PACEMAKER GENERATOR CHANGE;  Surgeon: Coralyn Mark, MD;  Location: [facility] CATH  [facility];  Service: Cardiovascular;  Laterality: Bilateral;  . PACEMAKER PLACEMENT  2002; 2014   SJM implanted by Dr Lovena Le with AV nodal ablation performed; gen change to Accent SR RF by Dr Rayann Heman 02-01-13  . RETINAL LASER PROCEDURE Left   . SLT LASER APPLICATION Left 74/11/4494   Procedure: SLT LASER APPLICATION;  Surgeon: Williams Che, MD;  Location: [facility];  Service: Ophthalmology;  Laterality: Left;    SOCIAL HISTORY:  Social History   Socioeconomic History  . Marital status: Married    Spouse name: Not on file  . Number of children: Not on file  . Years of education: Not on file  . Highest education level: Not on file  Occupational History  . Not on file  Tobacco Use  . Smoking status: Never Smoker  . Smokeless tobacco: Never Used  Vaping Use  . Vaping Use: Never used  Substance and Sexual Activity  . Alcohol use: No    Alcohol/week: 0.0 standard drinks  . Drug use: No  . Sexual activity: Not on file  Other Topics Concern  . Not on file  Social History Narrative  . Not on file   Social Determinants of Health   Financial Resource Strain: Not on file  Food Insecurity: Not on file  Transportation Needs: Not on file  Physical Activity: Not on file  Stress: Not on file  Social Connections: Not on file  Intimate Partner Violence: Not on file    FAMILY HISTORY:  Family History  Problem Relation Age of  Onset  . COPD Mother   . Emphysema Mother   . Cancer - Lung Father   . Heart disease Paternal Uncle   . Stomach cancer Neg Hx   . Colon cancer Neg Hx     CURRENT MEDICATIONS:  Current Outpatient Medications  Medication Sig Dispense Refill  . allopurinol (ZYLOPRIM) 100 MG tablet Take 100 mg by mouth Daily.     . Ascorbic Acid 500 MG TBCR Take 1 tablet by mouth daily.    . BD INSULIN SYRINGE U/F 31G X 5/16" 0.3 ML MISC     . cholecalciferol (VITAMIN D3) 25 MCG (1000 UNIT) tablet Take 1,000 Units by mouth daily.    . colchicine 0.6 MG tablet Take 0.6 mg by mouth  as needed.     Marland Kitchen ELIQUIS 5 MG TABS tablet Take 5 mg by mouth 2 (two) times daily.    . famotidine (PEPCID) 20 MG tablet Take 20 mg by mouth 2 (two) times daily.    . fish oil-omega-3 fatty acids 1000 MG capsule Take 1 g by mouth 2 (two) times daily.    Marland Kitchen HUMULIN 70/30 (70-30) 100 UNIT/ML injection Inject 30 Units into the skin 2 (two) times daily with a meal.     . isosorbide mononitrate (IMDUR) 60 MG 24 hr tablet Take 1 tablet (60 mg total) by mouth daily. 30 tablet 6  . lisinopril (ZESTRIL) 10 MG tablet Take 10 mg by mouth every evening.     . memantine (NAMENDA) 10 MG tablet TAKE (1) TABLET TWICE DAILY. 180 tablet 3  . metFORMIN (GLUCOPHAGE) 1000 MG tablet Take 1,000 mg by mouth 2 (two) times daily with a meal.    . metoprolol succinate (TOPROL-XL) 25 MG 24 hr tablet TAKE 1 TABLET BY MOUTH  DAILY 90 tablet 0  . nitroGLYCERIN (NITROSTAT) 0.4 MG SL tablet Place 1 tablet (0.4 mg total) under the tongue every 5 (five) minutes x 3 doses as needed. 25 tablet 3  . ONETOUCH VERIO test strip     . sertraline (ZOLOFT) 100 MG tablet Take 100 mg by mouth daily.    . simvastatin (ZOCOR) 40 MG tablet Take 40 mg by mouth at bedtime.    . Tamsulosin HCl (FLOMAX PO) Take 20 mg by mouth daily.     Marland Kitchen zinc gluconate 50 MG tablet Take 50 mg by mouth daily.     No current facility-administered medications for this visit.    ALLERGIES:  Allergies  Allergen Reactions  . Contrast Media [Iodinated Diagnostic Agents]   . Shellfish Allergy Itching and Rash  . Sulfonamide Derivatives Itching and Rash    PHYSICAL EXAM:  Performance status (ECOG): 1 - Symptomatic but completely ambulatory  Vitals:   04/23/20 1512  BP: (!) 147/70  Pulse: 64  Resp: 18  Temp: (!) 97.1 F (36.2 C)  SpO2: 98%   Wt Readings from Last 3 Encounters:  04/23/20 195 lb 14.4 oz (88.9 kg)  04/17/20 198 lb (89.8 kg)  01/13/20 200 lb (90.7 kg)   Physical Exam Vitals reviewed.  Constitutional:      Appearance: Normal appearance.   Cardiovascular:     Rate and Rhythm: Normal rate and regular rhythm.     Pulses: Normal pulses.     Heart sounds: Normal heart sounds.  Pulmonary:     Effort: Pulmonary effort is normal.     Breath sounds: Normal breath sounds.  Chest:  Breasts:     Right: No supraclavicular adenopathy.  Left: No supraclavicular adenopathy.    Abdominal:     Palpations: Abdomen is soft. There is no hepatomegaly, splenomegaly or mass.     Tenderness: There is no abdominal tenderness.     Hernia: No hernia is present.  Musculoskeletal:     Right lower leg: No edema.     Left lower leg: No edema.  Lymphadenopathy:     Cervical: No cervical adenopathy.     Upper Body:     Right upper body: No supraclavicular adenopathy.     Left upper body: No supraclavicular adenopathy.  Neurological:     General: No focal deficit present.     Mental Status: He is alert and oriented to person, place, and time.  Psychiatric:        Mood and Affect: Mood normal.        Behavior: Behavior normal.     LABORATORY DATA:  I have reviewed the labs as listed.  CBC Latest Ref Rng & Units 04/16/2020 11/28/2019 07/10/2019  WBC 4.0 - 10.5 K/uL 3.8(L) 3.4(L) 3.5(L)  Hemoglobin 13.0 - 17.0 g/dL 12.1(L) 12.1(L) 12.0(L)  Hematocrit 39.0 - 52.0 % 36.1(L) 36.0(L) 36.0(L)  Platelets 150 - 400 K/uL 99(L) 88(L) 99(L)   CMP Latest Ref Rng & Units 04/16/2020 11/28/2019 07/10/2019  Glucose 70 - 99 mg/dL 176(H) 257(H) 161(H)  BUN 8 - 23 mg/dL 29(H) 26(H) 17  Creatinine 0.61 - 1.24 mg/dL 1.24 1.11 1.00  Sodium 135 - 145 mmol/L 136 133(L) 137  Potassium 3.5 - 5.1 mmol/L 4.8 4.6 4.9  Chloride 98 - 111 mmol/L 106 104 102  CO2 22 - 32 mmol/L '26 22 25  ' Calcium 8.9 - 10.3 mg/dL 9.3 9.4 9.0  Total Protein 6.5 - 8.1 g/dL 7.7 7.7 7.3  Total Bilirubin 0.3 - 1.2 mg/dL 1.1 1.0 1.4(H)  Alkaline Phos 38 - 126 U/L 102 93 103  AST 15 - 41 U/L 32 35 28  ALT 0 - 44 U/L 28 34 23      Component Value Date/Time   RBC 3.39 (L) 04/16/2020 1113    MCV 106.5 (H) 04/16/2020 1113   MCH 35.7 (H) 04/16/2020 1113   MCHC 33.5 04/16/2020 1113   RDW 12.7 04/16/2020 1113   LYMPHSABS 1.0 04/16/2020 1113   MONOABS 0.3 04/16/2020 1113   EOSABS 0.1 04/16/2020 1113   BASOSABS 0.0 04/16/2020 1113   Lab Results  Component Value Date   LDH 126 04/16/2020   LDH 133 11/28/2019   LDH 157 07/10/2019   Lab Results  Component Value Date   TOTALPROTELP 7.4 04/16/2020   ALBUMINELP 3.8 04/16/2020   A1GS 0.2 04/16/2020   A2GS 0.6 04/16/2020   BETS 0.8 04/16/2020   GAMS 2.0 (H) 04/16/2020   MSPIKE 1.5 (H) 04/16/2020   SPEI Comment 04/16/2020    Lab Results  Component Value Date   KPAFRELGTCHN 60.5 (H) 04/16/2020   LAMBDASER 23.1 04/16/2020   KAPLAMBRATIO 2.62 (H) 04/16/2020    DIAGNOSTIC IMAGING:  I have independently reviewed the scans and discussed with the patient. CUP PACEART INCLINIC DEVICE CHECK  Result Date: 04/17/2020 Pacemaker check in clinic. Normal device function. Thresholds, sensing, impedances consistent with previous measurements. Device programmed to maximize longevity. No high ventricular rates noted. Device programmed at appropriate safety margins. Histogram  distribution appropriate for patient activity level. Estimated longevity 11.3-12.1 years. Patient enrolled in remote follow-up 07/16/20. Patients home remote box is not currently plugged in. Requested patient plug in and if need assistance to call the device  clinic. Verbalized understanding.  Patient education completed.Lavenia Atlas, BSN, RN    ASSESSMENT:  1. IgA kappa smoldering myeloma: -Bone marrow biopsy on 09/05/2017 shows 12% plasma cells. -Skeletal survey on 11/29/2019 was negative for lytic lesions.  2. Macrocytic anemia: -Hemoglobin is 12 and hematocrit is 36. MCV is 107. Previous work-up for C38, folic acid was normal.  3. Thrombocytopenia: -He has mild to moderate thrombocytopenia. Platelet count is 99. -CT of the abdomen and pelvis on 04/11/2017  showed normal spleen. -Bone marrow biopsy showed normal megakaryocytes. Most likely etiology is ITP.   PLAN:  1. IgA kappa smoldering myeloma: -No new bone pains reported. -Reviewed myeloma labs from 04/16/2020.  M spike is stable around 1.5.  Kappa light chain is 60.5 and ratio of 2.62. -Recommend follow-up in 6 months with repeat myeloma labs. -We will also check skeletal survey at next visit.  2. Macrocytic anemia: -Hemoglobin is stable at 12.1 with MCV 106. -We will check F84, folic acid at next visit.  3. Thrombocytopenia: -Platelet count improved to 99.  Denies any easy bruising or bleeding.  Orders placed this encounter:  No orders of the defined types were placed in this encounter.    Derek Jack, MD [place] [phone]   I, Milinda Antis, am acting as a scribe for Dr. Sanda Linger.  I, Derek Jack MD, have reviewed the above documentation for accuracy and completeness, and I agree with the above.

## 2020-04-23 NOTE — Patient Instructions (Signed)
[place] at [facility] Discharge Instructions  You were seen today by Dr. Delton Coombes. He went over your recent results. You will be scheduled to have x-rays of your long bones done before your next visit. Dr. Delton Coombes will see you back in 6 months for labs and follow up.   Thank you for choosing [place] at [facility] to provide your oncology and hematology care.  To afford each patient quality time with our provider, please arrive at least 15 minutes before your scheduled appointment time.   If you have a lab appointment with the [facility] please come in thru the Main Entrance and check in at the main information desk  You need to re-schedule your appointment should you arrive 10 or more minutes late.  We strive to give you quality time with our providers, and arriving late affects you and other patients whose appointments are after yours.  Also, if you no show three or more times for appointments you may be dismissed from the clinic at the providers discretion.     Again, thank you for choosing [facility].  Our hope is that these requests will decrease the amount of time that you wait before being seen by our physicians.       _____________________________________________________________  Should you have questions after your visit to [facility], please contact our office at [phone] between the hours of 8:00 a.m. and 4:30 p.m.  Voicemails left after 4:00 p.m. will not be returned until the following business day.  For prescription refill requests, have your pharmacy contact our office and allow 72 hours.    Cancer Center Support Programs:   > Cancer Support Group  2nd Tuesday of the month 1pm-2pm, Journey Room

## 2020-04-30 DIAGNOSIS — Z23 Encounter for immunization: Secondary | ICD-10-CM | POA: Diagnosis not present

## 2020-05-03 ENCOUNTER — Other Ambulatory Visit: Payer: Self-pay | Admitting: Internal Medicine

## 2020-06-10 DIAGNOSIS — I10 Essential (primary) hypertension: Secondary | ICD-10-CM | POA: Diagnosis not present

## 2020-06-16 DIAGNOSIS — G4733 Obstructive sleep apnea (adult) (pediatric): Secondary | ICD-10-CM | POA: Diagnosis not present

## 2020-07-09 DIAGNOSIS — I10 Essential (primary) hypertension: Secondary | ICD-10-CM | POA: Diagnosis not present

## 2020-07-09 DIAGNOSIS — E1165 Type 2 diabetes mellitus with hyperglycemia: Secondary | ICD-10-CM | POA: Diagnosis not present

## 2020-07-09 DIAGNOSIS — I509 Heart failure, unspecified: Secondary | ICD-10-CM | POA: Diagnosis not present

## 2020-07-09 DIAGNOSIS — R030 Elevated blood-pressure reading, without diagnosis of hypertension: Secondary | ICD-10-CM | POA: Diagnosis not present

## 2020-07-09 DIAGNOSIS — E1122 Type 2 diabetes mellitus with diabetic chronic kidney disease: Secondary | ICD-10-CM | POA: Diagnosis not present

## 2020-07-09 DIAGNOSIS — Z299 Encounter for prophylactic measures, unspecified: Secondary | ICD-10-CM | POA: Diagnosis not present

## 2020-07-09 DIAGNOSIS — N183 Chronic kidney disease, stage 3 unspecified: Secondary | ICD-10-CM | POA: Diagnosis not present

## 2020-07-14 ENCOUNTER — Other Ambulatory Visit: Payer: Self-pay | Admitting: Emergency Medicine

## 2020-07-14 MED ORDER — MEMANTINE HCL 10 MG PO TABS: 10.0000 mg | ORAL_TABLET | Freq: Two times a day (BID) | ORAL | 3 refills | Status: DC

## 2020-07-16 DIAGNOSIS — G4733 Obstructive sleep apnea (adult) (pediatric): Secondary | ICD-10-CM | POA: Diagnosis not present

## 2020-07-22 DIAGNOSIS — G4733 Obstructive sleep apnea (adult) (pediatric): Secondary | ICD-10-CM | POA: Diagnosis not present

## 2020-07-26 NOTE — Progress Notes (Deleted)
Cardiology Office Note  Date: 07/26/2020   ID: Jay Campbell, DOB 1945-04-24, MRN [id number]  PCP:  Monico Blitz, MD  Cardiologist:  Rozann Lesches, MD Electrophysiologist:  Thompson Grayer, MD   No chief complaint on file.   History of Present Illness: Jay Campbell is a 75 y.o. male that I have not seen in the office since 2019.  Interval history reviewed, his most recent visit was with Dr. Rayann Heman in February.  He follows with Dr. Rayann Heman, [employer] pacemaker in place.  Most recent device interrogation in February indicated normal function.  He is on Coumadin for stroke prophylaxis, follows with Dr. Manuella Ghazi.  Past Medical History:  Diagnosis Date  . Anxiety   . Atrial fibrillation ([facility])    Permanent  . Coronary artery disease    Mild nonobstructive 1/12  . DM (diabetes mellitus) ([facility])   . GERD (gastroesophageal reflux disease)   . Gout   . Hearing disorder, cochlear   . Hiatal hernia   . HLD (hyperlipidemia)   . HTN (hypertension)   . S/P AV nodal ablation    s/p SJM PPM; gen change 02-01-13 by Dr Rayann Heman  . 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 Warfarin anticoagulation     Past Surgical History:  Procedure Laterality Date  . CATARACT EXTRACTION Right   . COCHLEAR IMPLANT Right   . HEMORRHOID BANDING    . KNEE ARTHROSCOPY Right 1992   Dr Luna Glasgow   . PACEMAKER GENERATOR CHANGE Bilateral 02/01/2013   Procedure: PACEMAKER GENERATOR CHANGE;  Surgeon: Coralyn Mark, MD;  Location: [facility];  Service: Cardiovascular;  Laterality: Bilateral;  . PACEMAKER PLACEMENT  2002; 2014   SJM implanted by Dr Lovena Le with AV nodal ablation performed; gen change to Accent SR RF by Dr Rayann Heman 02-01-13  . RETINAL LASER PROCEDURE Left   . SLT LASER APPLICATION Left 63/10/4534   Procedure: SLT LASER APPLICATION;  Surgeon: Williams Che, MD;  Location: [facility];  Service: Ophthalmology;  Laterality: Left;    Current Outpatient Medications  Medication Sig Dispense Refill   . allopurinol (ZYLOPRIM) 100 MG tablet Take 100 mg by mouth Daily.     . Ascorbic Acid 500 MG TBCR Take 1 tablet by mouth daily.    . BD INSULIN SYRINGE U/F 31G X 5/16" 0.3 ML MISC     . cholecalciferol (VITAMIN D3) 25 MCG (1000 UNIT) tablet Take 1,000 Units by mouth daily.    . colchicine 0.6 MG tablet Take 0.6 mg by mouth as needed.     Marland Kitchen ELIQUIS 5 MG TABS tablet Take 5 mg by mouth 2 (two) times daily.    . famotidine (PEPCID) 20 MG tablet Take 20 mg by mouth 2 (two) times daily.    . fish oil-omega-3 fatty acids 1000 MG capsule Take 1 g by mouth 2 (two) times daily.    Marland Kitchen HUMULIN 70/30 (70-30) 100 UNIT/ML injection Inject 30 Units into the skin 2 (two) times daily with a meal.     . isosorbide mononitrate (IMDUR) 60 MG 24 hr tablet Take 1 tablet (60 mg total) by mouth daily. 30 tablet 6  . lisinopril (ZESTRIL) 10 MG tablet Take 10 mg by mouth every evening.     . memantine (NAMENDA) 10 MG tablet Take 1 tablet (10 mg total) by mouth 2 (two) times daily. 180 tablet 3  . metFORMIN (GLUCOPHAGE) 1000 MG tablet Take 1,000 mg by mouth 2 (two) times daily with a meal.    .  metoprolol succinate (TOPROL-XL) 25 MG 24 hr tablet TAKE 1 TABLET BY MOUTH  DAILY 90 tablet 1  . nitroGLYCERIN (NITROSTAT) 0.4 MG SL tablet Place 1 tablet (0.4 mg total) under the tongue every 5 (five) minutes x 3 doses as needed. 25 tablet 3  . ONETOUCH VERIO test strip     . sertraline (ZOLOFT) 100 MG tablet Take 100 mg by mouth daily.    . simvastatin (ZOCOR) 40 MG tablet Take 40 mg by mouth at bedtime.    . Tamsulosin HCl (FLOMAX PO) Take 20 mg by mouth daily.     Marland Kitchen zinc gluconate 50 MG tablet Take 50 mg by mouth daily.     No current facility-administered medications for this visit.   Allergies:  Contrast media [iodinated diagnostic agents], Shellfish allergy, and Sulfonamide derivatives   Social History: The patient  reports that he has never smoked. He has never used smokeless tobacco. He reports that he does not drink  alcohol and does not use drugs.   Family History: The patient's family history includes COPD in his mother; Cancer - Lung in his father; Emphysema in his mother; Heart disease in his paternal uncle.   ROS:  Please see the history of present illness. Otherwise, complete review of systems is positive for {NONE DEFAULTED:18576::"none"}.  All other systems are reviewed and negative.   Physical Exam: VS:  There were no vitals taken for this visit., BMI There is no height or weight on file to calculate BMI.  Wt Readings from Last 3 Encounters:  04/23/20 195 lb 14.4 oz (88.9 kg)  04/17/20 198 lb (89.8 kg)  01/13/20 200 lb (90.7 kg)    General: Patient appears comfortable at rest. HEENT: Conjunctiva and lids normal, oropharynx clear with moist mucosa. Neck: Supple, no elevated JVP or carotid bruits, no thyromegaly. Lungs: Clear to auscultation, nonlabored breathing at rest. Cardiac: Regular rate and rhythm, no S3 or significant systolic murmur, no pericardial rub. Abdomen: Soft, nontender, no hepatomegaly, bowel sounds present, no guarding or rebound. Extremities: No pitting edema, distal pulses 2+. Skin: Warm and dry. Musculoskeletal: No kyphosis. Neuropsychiatric: Alert and oriented x3, affect grossly appropriate.  ECG:  An ECG dated 04/17/2020 was personally reviewed today and demonstrated:  Ventricular pacing with underlying atrial fibrillation.  Recent Labwork: 04/16/2020: ALT 28; AST 32; BUN 29; Creatinine, Ser 1.24; Hemoglobin 12.1; Platelets 99; Potassium 4.8; Sodium 136     Component Value Date/Time   CHOL 108 03/12/2018 0417   TRIG 71 03/12/2018 0417   HDL 35 (L) 03/12/2018 0417   CHOLHDL 3.1 03/12/2018 0417   VLDL 14 03/12/2018 0417   LDLCALC 59 03/12/2018 0417    Other Studies Reviewed Today:  Echocardiogram 05/09/2019: 1. Left ventricular ejection fraction, by estimation, is 60 to 65%. The  left ventricle has normal function. The left ventricle has no regional  wall  motion abnormalities. Left ventricular diastolic parameters are  indeterminate.  2. Right ventricular systolic function is normal. The right ventricular  size is normal. There is mildly elevated pulmonary artery systolic  pressure.  3. Left atrial size was severely dilated.  4. Right atrial size was mild to moderately dilated.  5. The mitral valve is normal in structure and function. Trivial mitral  valve regurgitation. No evidence of mitral stenosis.  6. Tricuspid valve regurgitation is moderate.  7. The aortic valve is tricuspid. Aortic valve regurgitation is not  visualized. No aortic stenosis is present.  8. The inferior vena cava is normal in size with  greater than 50%  respiratory variability, suggesting right atrial pressure of 3 mmHg.   Assessment and Plan:    Medication Adjustments/Labs and Tests Ordered: Current medicines are reviewed at length with the patient today.  Concerns regarding medicines are outlined above.   Tests Ordered: No orders of the defined types were placed in this encounter.   Medication Changes: No orders of the defined types were placed in this encounter.   Disposition:  Follow up {follow up:15908}  Signed, Satira Sark, MD, [facility] 07/26/2020 3:37 PM    [facility] [place], [place], [place] [address] Phone: [phone]; Fax: [phone]

## 2020-07-27 ENCOUNTER — Ambulatory Visit: Payer: Medicare Other | Admitting: Cardiology

## 2020-07-27 DIAGNOSIS — W19XXXA Unspecified fall, initial encounter: Secondary | ICD-10-CM | POA: Diagnosis not present

## 2020-07-27 DIAGNOSIS — I4821 Permanent atrial fibrillation: Secondary | ICD-10-CM

## 2020-07-27 DIAGNOSIS — R58 Hemorrhage, not elsewhere classified: Secondary | ICD-10-CM | POA: Diagnosis not present

## 2020-07-27 DIAGNOSIS — M7989 Other specified soft tissue disorders: Secondary | ICD-10-CM | POA: Diagnosis not present

## 2020-07-27 DIAGNOSIS — R6889 Other general symptoms and signs: Secondary | ICD-10-CM | POA: Diagnosis not present

## 2020-07-27 DIAGNOSIS — S0181XA Laceration without foreign body of other part of head, initial encounter: Secondary | ICD-10-CM | POA: Diagnosis not present

## 2020-07-27 DIAGNOSIS — G319 Degenerative disease of nervous system, unspecified: Secondary | ICD-10-CM | POA: Diagnosis not present

## 2020-07-27 DIAGNOSIS — S8000XA Contusion of unspecified knee, initial encounter: Secondary | ICD-10-CM | POA: Diagnosis not present

## 2020-07-27 DIAGNOSIS — W1839XA Other fall on same level, initial encounter: Secondary | ICD-10-CM | POA: Diagnosis not present

## 2020-07-27 DIAGNOSIS — G9389 Other specified disorders of brain: Secondary | ICD-10-CM | POA: Diagnosis not present

## 2020-07-27 DIAGNOSIS — M2578 Osteophyte, vertebrae: Secondary | ICD-10-CM | POA: Diagnosis not present

## 2020-07-27 DIAGNOSIS — M25561 Pain in right knee: Secondary | ICD-10-CM | POA: Diagnosis not present

## 2020-07-27 DIAGNOSIS — S199XXA Unspecified injury of neck, initial encounter: Secondary | ICD-10-CM | POA: Diagnosis not present

## 2020-07-27 DIAGNOSIS — S8002XA Contusion of left knee, initial encounter: Secondary | ICD-10-CM | POA: Diagnosis not present

## 2020-07-27 DIAGNOSIS — Z7901 Long term (current) use of anticoagulants: Secondary | ICD-10-CM | POA: Diagnosis not present

## 2020-07-27 DIAGNOSIS — M47813 Spondylosis without myelopathy or radiculopathy, cervicothoracic region: Secondary | ICD-10-CM | POA: Diagnosis not present

## 2020-07-27 DIAGNOSIS — I517 Cardiomegaly: Secondary | ICD-10-CM | POA: Diagnosis not present

## 2020-07-27 DIAGNOSIS — M25562 Pain in left knee: Secondary | ICD-10-CM | POA: Diagnosis not present

## 2020-07-27 DIAGNOSIS — S0990XA Unspecified injury of head, initial encounter: Secondary | ICD-10-CM | POA: Diagnosis not present

## 2020-07-27 DIAGNOSIS — I4891 Unspecified atrial fibrillation: Secondary | ICD-10-CM | POA: Diagnosis not present

## 2020-07-27 DIAGNOSIS — S8001XA Contusion of right knee, initial encounter: Secondary | ICD-10-CM | POA: Diagnosis not present

## 2020-07-27 DIAGNOSIS — R079 Chest pain, unspecified: Secondary | ICD-10-CM | POA: Diagnosis not present

## 2020-07-27 DIAGNOSIS — Z743 Need for continuous supervision: Secondary | ICD-10-CM | POA: Diagnosis not present

## 2020-08-10 DIAGNOSIS — R040 Epistaxis: Secondary | ICD-10-CM | POA: Diagnosis not present

## 2020-08-10 DIAGNOSIS — E7800 Pure hypercholesterolemia, unspecified: Secondary | ICD-10-CM | POA: Diagnosis not present

## 2020-08-16 DIAGNOSIS — G4733 Obstructive sleep apnea (adult) (pediatric): Secondary | ICD-10-CM | POA: Diagnosis not present

## 2020-08-25 DIAGNOSIS — H401234 Low-tension glaucoma, bilateral, indeterminate stage: Secondary | ICD-10-CM | POA: Diagnosis not present

## 2020-09-10 DIAGNOSIS — E7800 Pure hypercholesterolemia, unspecified: Secondary | ICD-10-CM | POA: Diagnosis not present

## 2020-09-10 DIAGNOSIS — R040 Epistaxis: Secondary | ICD-10-CM | POA: Diagnosis not present

## 2020-10-09 DIAGNOSIS — Z299 Encounter for prophylactic measures, unspecified: Secondary | ICD-10-CM | POA: Diagnosis not present

## 2020-10-09 DIAGNOSIS — I10 Essential (primary) hypertension: Secondary | ICD-10-CM | POA: Diagnosis not present

## 2020-10-09 DIAGNOSIS — G4733 Obstructive sleep apnea (adult) (pediatric): Secondary | ICD-10-CM | POA: Diagnosis not present

## 2020-10-09 DIAGNOSIS — I509 Heart failure, unspecified: Secondary | ICD-10-CM | POA: Diagnosis not present

## 2020-10-09 DIAGNOSIS — E1165 Type 2 diabetes mellitus with hyperglycemia: Secondary | ICD-10-CM | POA: Diagnosis not present

## 2020-10-11 DIAGNOSIS — E7800 Pure hypercholesterolemia, unspecified: Secondary | ICD-10-CM | POA: Diagnosis not present

## 2020-10-11 DIAGNOSIS — R040 Epistaxis: Secondary | ICD-10-CM | POA: Diagnosis not present

## 2020-10-19 ENCOUNTER — Other Ambulatory Visit: Payer: Self-pay | Admitting: Internal Medicine

## 2020-10-21 ENCOUNTER — Inpatient Hospital Stay (HOSPITAL_COMMUNITY): Payer: Medicare Other | Attending: Hematology

## 2020-10-21 ENCOUNTER — Ambulatory Visit (HOSPITAL_COMMUNITY)
Admission: RE | Admit: 2020-10-21 | Discharge: 2020-10-21 | Disposition: A | Discharge status: Home / Self Care | Payer: Medicare Other | Source: Ambulatory Visit | Attending: Hematology | Admitting: Hematology

## 2020-10-21 ENCOUNTER — Other Ambulatory Visit: Payer: Self-pay

## 2020-10-21 DIAGNOSIS — D539 Nutritional anemia, unspecified: Secondary | ICD-10-CM

## 2020-10-21 DIAGNOSIS — Z8249 Family history of ischemic heart disease and other diseases of the circulatory system: Secondary | ICD-10-CM | POA: Diagnosis not present

## 2020-10-21 DIAGNOSIS — Z7901 Long term (current) use of anticoagulants: Secondary | ICD-10-CM | POA: Diagnosis not present

## 2020-10-21 DIAGNOSIS — Z79899 Other long term (current) drug therapy: Secondary | ICD-10-CM | POA: Insufficient documentation

## 2020-10-21 DIAGNOSIS — D696 Thrombocytopenia, unspecified: Secondary | ICD-10-CM | POA: Diagnosis not present

## 2020-10-21 DIAGNOSIS — Z836 Family history of other diseases of the respiratory system: Secondary | ICD-10-CM | POA: Diagnosis not present

## 2020-10-21 DIAGNOSIS — C9000 Multiple myeloma not having achieved remission: Secondary | ICD-10-CM | POA: Insufficient documentation

## 2020-10-21 DIAGNOSIS — D472 Monoclonal gammopathy: Secondary | ICD-10-CM | POA: Diagnosis not present

## 2020-10-21 DIAGNOSIS — F32A Depression, unspecified: Secondary | ICD-10-CM | POA: Insufficient documentation

## 2020-10-21 DIAGNOSIS — D649 Anemia, unspecified: Secondary | ICD-10-CM | POA: Diagnosis not present

## 2020-10-21 DIAGNOSIS — Z8719 Personal history of other diseases of the digestive system: Secondary | ICD-10-CM | POA: Insufficient documentation

## 2020-10-21 DIAGNOSIS — Z882 Allergy status to sulfonamides status: Secondary | ICD-10-CM | POA: Diagnosis not present

## 2020-10-21 DIAGNOSIS — R42 Dizziness and giddiness: Secondary | ICD-10-CM | POA: Insufficient documentation

## 2020-10-21 DIAGNOSIS — R519 Headache, unspecified: Secondary | ICD-10-CM | POA: Insufficient documentation

## 2020-10-21 DIAGNOSIS — I4891 Unspecified atrial fibrillation: Secondary | ICD-10-CM | POA: Diagnosis not present

## 2020-10-21 DIAGNOSIS — Z801 Family history of malignant neoplasm of trachea, bronchus and lung: Secondary | ICD-10-CM | POA: Insufficient documentation

## 2020-10-21 LAB — CBC WITH DIFFERENTIAL/PLATELET
Abs Immature Granulocytes: 0.01 10*3/uL (ref 0.00–0.07)
Basophils Absolute: 0 10*3/uL (ref 0.0–0.1)
Basophils Relative: 0 %
Eosinophils Absolute: 0.1 10*3/uL (ref 0.0–0.5)
Eosinophils Relative: 2 %
HCT: 32.6 % — ABNORMAL LOW (ref 39.0–52.0)
Hemoglobin: 11.3 g/dL — ABNORMAL LOW (ref 13.0–17.0)
Immature Granulocytes: 0 %
Lymphocytes Relative: 16 %
Lymphs Abs: 0.7 10*3/uL (ref 0.7–4.0)
MCH: 37.2 pg — ABNORMAL HIGH (ref 26.0–34.0)
MCHC: 34.7 g/dL (ref 30.0–36.0)
MCV: 107.2 fL — ABNORMAL HIGH (ref 80.0–100.0)
Monocytes Absolute: 0.3 10*3/uL (ref 0.1–1.0)
Monocytes Relative: 6 %
Neutro Abs: 3.3 10*3/uL (ref 1.7–7.7)
Neutrophils Relative %: 76 %
Platelets: 104 10*3/uL — ABNORMAL LOW (ref 150–400)
RBC: 3.04 MIL/uL — ABNORMAL LOW (ref 4.22–5.81)
RDW: 12.5 % (ref 11.5–15.5)
WBC: 4.4 10*3/uL (ref 4.0–10.5)
nRBC: 0 % (ref 0.0–0.2)

## 2020-10-21 LAB — COMPREHENSIVE METABOLIC PANEL
ALT: 19 U/L (ref 0–44)
AST: 24 U/L (ref 15–41)
Albumin: 3.7 g/dL (ref 3.5–5.0)
Alkaline Phosphatase: 115 U/L (ref 38–126)
Anion gap: 5 (ref 5–15)
BUN: 25 mg/dL — ABNORMAL HIGH (ref 8–23)
CO2: 23 mmol/L (ref 22–32)
Calcium: 8.9 mg/dL (ref 8.9–10.3)
Chloride: 104 mmol/L (ref 98–111)
Creatinine, Ser: 1.22 mg/dL (ref 0.61–1.24)
GFR, Estimated: >60 mL/min (ref >60)
Glucose, Bld: 294 mg/dL — ABNORMAL HIGH (ref 70–99)
Potassium: 4.5 mmol/L (ref 3.5–5.1)
Sodium: 132 mmol/L — ABNORMAL LOW (ref 135–145)
Total Bilirubin: 0.9 mg/dL (ref 0.3–1.2)
Total Protein: 7.7 g/dL (ref 6.5–8.1)

## 2020-10-21 LAB — VITAMIN B12: Vitamin B-12: 304 pg/mL (ref 180–914)

## 2020-10-21 LAB — FOLATE: Folate: 29.4 ng/mL (ref >5.9)

## 2020-10-21 LAB — LACTATE DEHYDROGENASE: LDH: 128 U/L (ref 98–192)

## 2020-10-22 LAB — KAPPA/LAMBDA LIGHT CHAINS
Kappa free light chain: 62.1 mg/L — ABNORMAL HIGH (ref 3.3–19.4)
Kappa, lambda light chain ratio: 3.41 — ABNORMAL HIGH (ref 0.26–1.65)
Lambda free light chains: 18.2 mg/L (ref 5.7–26.3)

## 2020-10-23 DIAGNOSIS — I509 Heart failure, unspecified: Secondary | ICD-10-CM | POA: Diagnosis not present

## 2020-10-23 DIAGNOSIS — E1122 Type 2 diabetes mellitus with diabetic chronic kidney disease: Secondary | ICD-10-CM | POA: Diagnosis not present

## 2020-10-23 DIAGNOSIS — I10 Essential (primary) hypertension: Secondary | ICD-10-CM | POA: Diagnosis not present

## 2020-10-23 DIAGNOSIS — G4733 Obstructive sleep apnea (adult) (pediatric): Secondary | ICD-10-CM | POA: Diagnosis not present

## 2020-10-23 DIAGNOSIS — Z299 Encounter for prophylactic measures, unspecified: Secondary | ICD-10-CM | POA: Diagnosis not present

## 2020-10-23 LAB — PROTEIN ELECTROPHORESIS, SERUM
A/G Ratio: 1.1 (ref 0.7–1.7)
Albumin ELP: 3.6 g/dL (ref 2.9–4.4)
Alpha-1-Globulin: 0.2 g/dL (ref 0.0–0.4)
Alpha-2-Globulin: 0.6 g/dL (ref 0.4–1.0)
Beta Globulin: 0.8 g/dL (ref 0.7–1.3)
Gamma Globulin: 1.9 g/dL — ABNORMAL HIGH (ref 0.4–1.8)
Globulin, Total: 3.4 g/dL (ref 2.2–3.9)
M-Spike, %: 1.4 g/dL — ABNORMAL HIGH
Total Protein ELP: 7 g/dL (ref 6.0–8.5)

## 2020-10-27 NOTE — Progress Notes (Signed)
Jay Campbell, [place] [address]   CLINIC:  Medical Oncology/Hematology  PCP:  Monico Blitz, [place] / [place] [address]  [phone]  REASON FOR VISIT:  Follow-up for smoldering multiple myeloma, macrocytic anemia and thrombocytopenia  PRIOR THERAPY: none  CURRENT THERAPY: surveillance  INTERVAL HISTORY:  [name], a 75 y.o. male, returns for routine follow-up for his smoldering multiple myeloma, macrocytic anemia and thrombocytopenia. Laren was last seen on 04/23/20.  Today he reports feeling well. He reports 1 fall in May due to dizziness; the dizziness is worsened upon standing. He denies any recent fevers, night sweats, weight loss, bleeding issues, easy bruising, tingling/numbness,  infection, or pains.   REVIEW OF SYSTEMS:  Review of Systems  Constitutional:  Positive for appetite change (0%) and fatigue (depleted). Negative for fever and unexpected weight change.  HENT:   Negative for nosebleeds.   Gastrointestinal:  Negative for blood in stool.  Genitourinary:  Positive for difficulty urinating and frequency. Negative for hematuria.   Musculoskeletal:  Negative for arthralgias.  Neurological:  Positive for dizziness and headaches. Negative for numbness.  Hematological:  Does not bruise/bleed easily.  Psychiatric/Behavioral:  Positive for depression.   All other systems reviewed and are negative.  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   Warfarin anticoagulation    Past  Surgical History:  Procedure Laterality Date   CATARACT EXTRACTION Right    COCHLEAR IMPLANT Right    HEMORRHOID BANDING     KNEE ARTHROSCOPY Right 1992   Dr Luna Glasgow    PACEMAKER GENERATOR CHANGE Bilateral 02/01/2013   Procedure: PACEMAKER GENERATOR CHANGE;  Surgeon: Coralyn Mark, MD;  Location: [facility];  Service: Cardiovascular;  Laterality: Bilateral;   PACEMAKER PLACEMENT  2002; 2014   SJM implanted by Dr Lovena Le with AV nodal ablation performed; gen change to Accent SR RF by Dr Rayann Heman 02-01-13   RETINAL LASER PROCEDURE Left    SLT LASER APPLICATION Left 48/04/7076   Procedure: SLT LASER APPLICATION;  Surgeon: Williams Che, MD;  Location: [facility];  Service: Ophthalmology;  Laterality: Left;    SOCIAL HISTORY:  Social History   Socioeconomic History   Marital status: Married    Spouse name: Not on file   Number of children: Not on file   Years of education: Not on file   Highest education level: Not on file  Occupational History   Not on file  Tobacco Use   Smoking status: Never   Smokeless tobacco: Never  Vaping Use   Vaping Use: Never used  Substance and Sexual Activity   Alcohol use: No    Alcohol/week: 0.0 standard drinks   Drug use: No   Sexual activity: Not on file  Other Topics Concern   Not on file  Social History Narrative   Not on file   Social Determinants of Health   Financial Resource Strain: Not on file  Food  Insecurity: Not on file  Transportation Needs: Not on file  Physical Activity: Not on file  Stress: Not on file  Social Connections: Not on file  Intimate Partner Violence: Not on file    FAMILY HISTORY:  Family History  Problem Relation Age of Onset   COPD Mother    Emphysema Mother    Cancer - Lung Father    Heart disease Paternal Uncle    Stomach cancer Neg Hx    Colon cancer Neg Hx     CURRENT MEDICATIONS:  Current Outpatient Medications  Medication Sig Dispense Refill   allopurinol (ZYLOPRIM) 100 MG tablet Take 100 mg  by mouth Daily.      Ascorbic Acid 500 MG TBCR Take 1 tablet by mouth daily.     BD INSULIN SYRINGE U/F 31G X 5/16" 0.3 ML MISC      cholecalciferol (VITAMIN D3) 25 MCG (1000 UNIT) tablet Take 1,000 Units by mouth daily.     colchicine 0.6 MG tablet Take 0.6 mg by mouth as needed.      ELIQUIS 5 MG TABS tablet Take 5 mg by mouth 2 (two) times daily.     famotidine (PEPCID) 20 MG tablet Take 20 mg by mouth 2 (two) times daily.     fish oil-omega-3 fatty acids 1000 MG capsule Take 1 g by mouth 2 (two) times daily.     HUMULIN 70/30 (70-30) 100 UNIT/ML injection Inject 30 Units into the skin 2 (two) times daily with a meal.      isosorbide mononitrate (IMDUR) 60 MG 24 hr tablet Take 1 tablet (60 mg total) by mouth daily. 30 tablet 6   lisinopril (ZESTRIL) 10 MG tablet Take 10 mg by mouth every evening.      memantine (NAMENDA) 10 MG tablet Take 1 tablet (10 mg total) by mouth 2 (two) times daily. 180 tablet 3   metFORMIN (GLUCOPHAGE) 1000 MG tablet Take 1,000 mg by mouth 2 (two) times daily with a meal.     metoprolol succinate (TOPROL-XL) 25 MG 24 hr tablet TAKE 1 TABLET BY MOUTH  DAILY 90 tablet 3   nitroGLYCERIN (NITROSTAT) 0.4 MG SL tablet Place 1 tablet (0.4 mg total) under the tongue every 5 (five) minutes x 3 doses as needed. 25 tablet 3   ONETOUCH VERIO test strip      sertraline (ZOLOFT) 100 MG tablet Take 100 mg by mouth daily.     simvastatin (ZOCOR) 40 MG tablet Take 40 mg by mouth at bedtime.     Tamsulosin HCl (FLOMAX PO) Take 20 mg by mouth daily.      zinc gluconate 50 MG tablet Take 50 mg by mouth daily.     No current facility-administered medications for this visit.    ALLERGIES:  Allergies  Allergen Reactions   Contrast Media [Iodinated Diagnostic Agents]    Shellfish Allergy Itching and Rash   Sulfonamide Derivatives Itching and Rash    PHYSICAL EXAM:  Performance status (ECOG): 1 - Symptomatic but completely ambulatory  There were no vitals filed for this  visit. Wt Readings from Last 3 Encounters:  04/23/20 195 lb 14.4 oz (88.9 kg)  04/17/20 198 lb (89.8 kg)  01/13/20 200 lb (90.7 kg)   Physical Exam Vitals reviewed.  Constitutional:      Appearance: Normal appearance.  Cardiovascular:     Rate and Rhythm: Normal rate and regular rhythm.     Pulses: Normal pulses.     Heart sounds: Normal heart sounds.  Pulmonary:     Effort: Pulmonary effort is normal.     Breath sounds: Normal breath sounds.  Neurological:     General: No focal deficit present.     Mental Status: He is alert and oriented to person, place, and time.  Psychiatric:        Mood and Affect: Mood normal.        Behavior: Behavior normal.    LABORATORY DATA:  I have reviewed the labs as listed.  CBC Latest Ref Rng & Units 10/21/2020 04/16/2020 11/28/2019  WBC 4.0 - 10.5 K/uL 4.4 3.8(L) 3.4(L)  Hemoglobin 13.0 - 17.0 g/dL 11.3(L) 12.1(L) 12.1(L)  Hematocrit 39.0 - 52.0 % 32.6(L) 36.1(L) 36.0(L)  Platelets 150 - 400 K/uL 104(L) 99(L) 88(L)   CMP Latest Ref Rng & Units 10/21/2020 04/16/2020 11/28/2019  Glucose 70 - 99 mg/dL 294(H) 176(H) 257(H)  BUN 8 - 23 mg/dL 25(H) 29(H) 26(H)  Creatinine 0.61 - 1.24 mg/dL 1.22 1.24 1.11  Sodium 135 - 145 mmol/L 132(L) 136 133(L)  Potassium 3.5 - 5.1 mmol/L 4.5 4.8 4.6  Chloride 98 - 111 mmol/L 104 106 104  CO2 22 - 32 mmol/L '23 26 22  ' Calcium 8.9 - 10.3 mg/dL 8.9 9.3 9.4  Total Protein 6.5 - 8.1 g/dL 7.7 7.7 7.7  Total Bilirubin 0.3 - 1.2 mg/dL 0.9 1.1 1.0  Alkaline Phos 38 - 126 U/L 115 102 93  AST 15 - 41 U/L 24 32 35  ALT 0 - 44 U/L 19 28 34      Component Value Date/Time   RBC 3.04 (L) 10/21/2020 1039   MCV 107.2 (H) 10/21/2020 1039   MCH 37.2 (H) 10/21/2020 1039   MCHC 34.7 10/21/2020 1039   RDW 12.5 10/21/2020 1039   LYMPHSABS 0.7 10/21/2020 1039   MONOABS 0.3 10/21/2020 1039   EOSABS 0.1 10/21/2020 1039   BASOSABS 0.0 10/21/2020 1039    DIAGNOSTIC IMAGING:  I have independently reviewed the scans and discussed  with the patient. DG Bone Survey Met  Result Date: 10/22/2020 CLINICAL DATA:  Multiple myeloma. EXAM: METASTATIC BONE SURVEY COMPARISON:  Skeletal survey dated 11/28/2019. FINDINGS: There is no acute fracture or dislocation. The bones are osteopenic. No lytic lesion identified. Stable sclerotic focus of the calvarium. There is atherosclerotic calcification of the abdominal aorta. Degenerative changes of the spine and hips. Left pectoral pacemaker device. The soft tissues are unremarkable. IMPRESSION: No lytic lesion. Electronically Signed   By: Anner Crete M.D.   On: 10/22/2020 13:55     ASSESSMENT:  1.  IgA kappa smoldering myeloma: -Bone marrow biopsy on 09/05/2017 shows 12% plasma cells. -Skeletal survey on 11/29/2019 was negative for lytic lesions.   2.  Macrocytic anemia: -Hemoglobin is 12 and hematocrit is 36.  MCV is 107.  Previous work-up for V77, folic acid was normal.   3.  Thrombocytopenia: -He has mild to moderate thrombocytopenia.  Platelet count is 99. -CT of the abdomen and pelvis on 04/11/2017 showed normal spleen. -Bone marrow biopsy showed normal megakaryocytes.  Most likely etiology is ITP.   PLAN:  1.  IgA kappa smoldering myeloma: - No new bone pains reported.  Denies any tingling or numbness in extremities. - He reportedly fell in May when he felt dizzy.  Denies any fevers, night sweats or weight loss. - Reviewed myeloma panel from 10/21/2020.  M spike is 1.4 g.  Kappa light chains are 62 and lambda light chains are 18.  Ratio is 3.41. - Reviewed skeletal survey  from 10/21/2020 which did not show any lytic lesions.  Creatinine is 1.22 and calcium was normal. - Recommend follow-up in 6 months with repeat myeloma labs.   2.  Macrocytic anemia: - Hemoglobin stable between 11-12 g.  MCV is 107. - Vitamin Q83 and folic acid were normal. - Differential includes renal insufficiency, early MDS.  We will plan to repeat ferritin and iron panel in 6 months.   3.   Thrombocytopenia: - Platelet count stable between 90-100 K.  Denies any easy bruising or bleeding.  Orders placed this encounter:  No orders of the defined types were placed in this encounter.    Derek Jack, MD [facility] [phone]   I, Thana Ates, am acting as a scribe for Dr. Derek Jack.  I, Derek Jack MD, have reviewed the above documentation for accuracy and completeness, and I agree with the above.

## 2020-10-28 ENCOUNTER — Inpatient Hospital Stay (HOSPITAL_COMMUNITY): Payer: Medicare Other | Admitting: Hematology

## 2020-10-28 ENCOUNTER — Other Ambulatory Visit: Payer: Self-pay

## 2020-10-28 VITALS — BP 145/60 | HR 63 | Temp 96.8°F | Resp 18 | Wt 203.0 lb

## 2020-10-28 DIAGNOSIS — R42 Dizziness and giddiness: Secondary | ICD-10-CM | POA: Diagnosis not present

## 2020-10-28 DIAGNOSIS — D696 Thrombocytopenia, unspecified: Secondary | ICD-10-CM

## 2020-10-28 DIAGNOSIS — Z8249 Family history of ischemic heart disease and other diseases of the circulatory system: Secondary | ICD-10-CM | POA: Diagnosis not present

## 2020-10-28 DIAGNOSIS — Z836 Family history of other diseases of the respiratory system: Secondary | ICD-10-CM | POA: Diagnosis not present

## 2020-10-28 DIAGNOSIS — Z882 Allergy status to sulfonamides status: Secondary | ICD-10-CM | POA: Diagnosis not present

## 2020-10-28 DIAGNOSIS — C9000 Multiple myeloma not having achieved remission: Secondary | ICD-10-CM | POA: Diagnosis not present

## 2020-10-28 DIAGNOSIS — Z8719 Personal history of other diseases of the digestive system: Secondary | ICD-10-CM | POA: Diagnosis not present

## 2020-10-28 DIAGNOSIS — I4891 Unspecified atrial fibrillation: Secondary | ICD-10-CM | POA: Diagnosis not present

## 2020-10-28 DIAGNOSIS — D649 Anemia, unspecified: Secondary | ICD-10-CM | POA: Diagnosis not present

## 2020-10-28 DIAGNOSIS — R519 Headache, unspecified: Secondary | ICD-10-CM | POA: Diagnosis not present

## 2020-10-28 DIAGNOSIS — Z79899 Other long term (current) drug therapy: Secondary | ICD-10-CM | POA: Diagnosis not present

## 2020-10-28 DIAGNOSIS — D472 Monoclonal gammopathy: Secondary | ICD-10-CM | POA: Diagnosis not present

## 2020-10-28 DIAGNOSIS — D539 Nutritional anemia, unspecified: Secondary | ICD-10-CM

## 2020-10-28 DIAGNOSIS — Z7901 Long term (current) use of anticoagulants: Secondary | ICD-10-CM | POA: Diagnosis not present

## 2020-10-28 DIAGNOSIS — F32A Depression, unspecified: Secondary | ICD-10-CM | POA: Diagnosis not present

## 2020-10-28 DIAGNOSIS — Z801 Family history of malignant neoplasm of trachea, bronchus and lung: Secondary | ICD-10-CM | POA: Diagnosis not present

## 2020-10-28 NOTE — Patient Instructions (Addendum)
[place] at [facility] Discharge Instructions  You were seen today by Dr. Delton Coombes. He went over your recent results and scans. Purchase Calcium tablets over the counter and take 1000 mg once daily. Dr. Delton Coombes will see you back in 6 months for labs and follow up.   Thank you for choosing [place] at [facility] to provide your oncology and hematology care.  To afford each patient quality time with our provider, please arrive at least 15 minutes before your scheduled appointment time.   If you have a lab appointment with the [facility] please come in thru the Main Entrance and check in at the main information desk  You need to re-schedule your appointment should you arrive 10 or more minutes late.  We strive to give you quality time with our providers, and arriving late affects you and other patients whose appointments are after yours.  Also, if you no show three or more times for appointments you may be dismissed from the clinic at the providers discretion.     Again, thank you for choosing [facility]  Our hope is that these requests will decrease the amount of time that you wait before being seen by our physicians.       _____________________________________________________________  Should you have questions after your visit to [facility], please contact our office at [phone] between the hours of 8:00 a.m. and 4:30 p.m.  Voicemails left after 4:00 p.m. will not be returned until the following business day.  For prescription refill requests, have your pharmacy contact our office and allow 72 hours.    Cancer Center Support Programs:   > Cancer Support Group  2nd Tuesday of the month 1pm-2pm, Journey Room

## 2020-11-11 DIAGNOSIS — R040 Epistaxis: Secondary | ICD-10-CM | POA: Diagnosis not present

## 2020-11-11 DIAGNOSIS — E7800 Pure hypercholesterolemia, unspecified: Secondary | ICD-10-CM | POA: Diagnosis not present

## 2020-11-19 DIAGNOSIS — E162 Hypoglycemia, unspecified: Secondary | ICD-10-CM | POA: Diagnosis not present

## 2020-11-19 DIAGNOSIS — R404 Transient alteration of awareness: Secondary | ICD-10-CM | POA: Diagnosis not present

## 2020-11-19 DIAGNOSIS — R410 Disorientation, unspecified: Secondary | ICD-10-CM | POA: Diagnosis not present

## 2020-11-19 DIAGNOSIS — I10 Essential (primary) hypertension: Secondary | ICD-10-CM | POA: Diagnosis not present

## 2020-11-26 DIAGNOSIS — L0390 Cellulitis, unspecified: Secondary | ICD-10-CM | POA: Diagnosis not present

## 2020-11-26 DIAGNOSIS — Z299 Encounter for prophylactic measures, unspecified: Secondary | ICD-10-CM | POA: Diagnosis not present

## 2020-11-26 DIAGNOSIS — R609 Edema, unspecified: Secondary | ICD-10-CM | POA: Diagnosis not present

## 2020-11-26 DIAGNOSIS — C9000 Multiple myeloma not having achieved remission: Secondary | ICD-10-CM | POA: Diagnosis not present

## 2020-12-03 DIAGNOSIS — I10 Essential (primary) hypertension: Secondary | ICD-10-CM | POA: Diagnosis not present

## 2020-12-03 DIAGNOSIS — M79661 Pain in right lower leg: Secondary | ICD-10-CM | POA: Diagnosis not present

## 2020-12-03 DIAGNOSIS — Z299 Encounter for prophylactic measures, unspecified: Secondary | ICD-10-CM | POA: Diagnosis not present

## 2020-12-03 DIAGNOSIS — G309 Alzheimer's disease, unspecified: Secondary | ICD-10-CM | POA: Diagnosis not present

## 2020-12-03 DIAGNOSIS — Z23 Encounter for immunization: Secondary | ICD-10-CM | POA: Diagnosis not present

## 2020-12-03 DIAGNOSIS — I4891 Unspecified atrial fibrillation: Secondary | ICD-10-CM | POA: Diagnosis not present

## 2020-12-07 DIAGNOSIS — I739 Peripheral vascular disease, unspecified: Secondary | ICD-10-CM | POA: Diagnosis not present

## 2020-12-07 DIAGNOSIS — I70211 Atherosclerosis of native arteries of extremities with intermittent claudication, right leg: Secondary | ICD-10-CM | POA: Diagnosis not present

## 2020-12-11 DIAGNOSIS — F32A Depression, unspecified: Secondary | ICD-10-CM | POA: Diagnosis not present

## 2020-12-21 DIAGNOSIS — R5383 Other fatigue: Secondary | ICD-10-CM | POA: Diagnosis not present

## 2020-12-21 DIAGNOSIS — Z79899 Other long term (current) drug therapy: Secondary | ICD-10-CM | POA: Diagnosis not present

## 2020-12-21 DIAGNOSIS — E785 Hyperlipidemia, unspecified: Secondary | ICD-10-CM | POA: Diagnosis not present

## 2020-12-21 DIAGNOSIS — Z299 Encounter for prophylactic measures, unspecified: Secondary | ICD-10-CM | POA: Diagnosis not present

## 2020-12-21 DIAGNOSIS — Z7189 Other specified counseling: Secondary | ICD-10-CM | POA: Diagnosis not present

## 2020-12-21 DIAGNOSIS — Z0000 Encounter for general adult medical examination without abnormal findings: Secondary | ICD-10-CM | POA: Diagnosis not present

## 2020-12-21 DIAGNOSIS — I10 Essential (primary) hypertension: Secondary | ICD-10-CM | POA: Diagnosis not present

## 2020-12-22 DIAGNOSIS — E785 Hyperlipidemia, unspecified: Secondary | ICD-10-CM | POA: Diagnosis not present

## 2020-12-22 DIAGNOSIS — R5383 Other fatigue: Secondary | ICD-10-CM | POA: Diagnosis not present

## 2020-12-22 DIAGNOSIS — Z79899 Other long term (current) drug therapy: Secondary | ICD-10-CM | POA: Diagnosis not present

## 2021-01-11 DIAGNOSIS — F32A Depression, unspecified: Secondary | ICD-10-CM | POA: Diagnosis not present

## 2021-01-12 DIAGNOSIS — Z299 Encounter for prophylactic measures, unspecified: Secondary | ICD-10-CM | POA: Diagnosis not present

## 2021-01-12 DIAGNOSIS — E1122 Type 2 diabetes mellitus with diabetic chronic kidney disease: Secondary | ICD-10-CM | POA: Diagnosis not present

## 2021-01-12 DIAGNOSIS — I10 Essential (primary) hypertension: Secondary | ICD-10-CM | POA: Diagnosis not present

## 2021-01-12 DIAGNOSIS — I509 Heart failure, unspecified: Secondary | ICD-10-CM | POA: Diagnosis not present

## 2021-01-12 DIAGNOSIS — E1165 Type 2 diabetes mellitus with hyperglycemia: Secondary | ICD-10-CM | POA: Diagnosis not present

## 2021-01-13 ENCOUNTER — Other Ambulatory Visit: Payer: Self-pay

## 2021-01-13 ENCOUNTER — Ambulatory Visit: Payer: Medicare Other | Admitting: Adult Health

## 2021-01-13 ENCOUNTER — Encounter: Payer: Self-pay | Admitting: Adult Health

## 2021-01-13 VITALS — BP 108/58 | HR 60 | Ht 72.0 in | Wt 200.0 lb

## 2021-01-13 DIAGNOSIS — G301 Alzheimer's disease with late onset: Secondary | ICD-10-CM

## 2021-01-13 DIAGNOSIS — F0280 Dementia in other diseases classified elsewhere without behavioral disturbance: Secondary | ICD-10-CM | POA: Diagnosis not present

## 2021-01-13 MED ORDER — MEMANTINE HCL 10 MG PO TABS: 10.0000 mg | ORAL_TABLET | Freq: Two times a day (BID) | ORAL | 3 refills | Status: DC

## 2021-01-13 NOTE — Patient Instructions (Signed)
Continue Namenda 10mg  twice daily - refill provided    Followup in the future with me in 1 year or call earlier if needed       Thank you for coming to see [place] at [facility]. I hope we have been able to provide you high quality care today.  You may receive a patient satisfaction survey over the next few weeks. We would appreciate your feedback and comments so that we may continue to improve ourselves and the health of our patients.

## 2021-01-13 NOTE — Progress Notes (Signed)
[facility] [address] [place]. [place] [address] [phone]       OFFICE FOLLOW UP NOTE  [name] Date of Birth:  1945/09/22 Medical Record Number:  [id number]   Referring MD: Jay Campbell  Reason for Referral: Dementia  Chief Complaint  Patient presents with   Follow-up    RM 3 with spouse jo ann  MMSE 15 Pt spouse states his memory up and down, hasn't noticed any worsening symptoms. Behavior is stable        HPI:   Update 01/13/2021 JM: Returns for yearly follow-up re: dementia likely Alzheimer's type.  He is accompanied by his wife, Jay Campbell.  Overall well since prior visit.  Cognition has been stable per wife although can fluctuate.  Remains on Namenda 10 mg twice daily -denies side effects.  MMSE today 15/30 (prior 17/30). At times can be more fatigued and takes more naps during the Jay. At times, can have difficulty sleeping at night. Denies behavioral concerns.  No new concerns at this time.      History provided for reference purposes only Update 01/13/2020 JM: [name] returns for follow-up accompanied by his wife regarding likely Alzheimer's dementia worsening after a fall in 09/2018.  Previously seen by Dr. Leonie Campbell for initial visit on 12/05/2018 and recommended 59-monthfollow-up but has not had a follow-up until this time.  Dementia panel obtained after prior visit which was within normal limits.  EEG showed mild generalized slowing correlating with diagnosis of memory loss and dementia without evidence of seizure activity.  He has remained on Namenda 10 mg twice daily tolerating well without side effects.  Since initial visit over 1 year ago, cognition has been stable without worsening.  MMSE today 17/30 previously 20/30.  No behavioral concerns such as delusions, hallucinations, agitation or safety concerns.  No concerns at this time.  Initial consult visit 12/05/2018 Dr. SLeonie Campbell [name] 75year old Caucasian male seen today for initial  office consultation visit dementia and confusion following recent head injury.  History is obtained from the patient and his wife who is accompanying him as well as review of referral notes and have personally reviewed imaging films in PACS.  He has a past medical history of diabetes, hypertension, hyperlipidemia lipidemia, paroxysmal A. fib on long-term warfarin, CHF, pacemaker placement and depression.  Patient wife states that he is had longstanding history of greater than a year of mild cognitive impairment and suspected dementia which was not never formally diagnosed with he was doing well at home and was compensating.  He had a fall in July 2020 when he hit his head and fell to the ground and dislodged his cochlear implant from his right mastoid region.  He was confused weak and disoriented.  [facility]  CT scan of the head on admission on 10/11/2018 showed no acute abnormality.  Repeat CT scan on 10/15/2018 also showed no acute abnormality.  MRI could not be obtained since he has a pacemaker.  Admission labs unremarkable CBC and complete metabolic panel labs were normal.  UA showed no infection.  Patient's cognitive status and confusion has never returned back to baseline following this and his wife is concerned about dementia.  Patient did not have any focal neurological deficits in the form of slurred speech, extremity weakness numbness loss of vision during this hospitalization.  Patient primary physician felt that he likely had a stroke even though it was not seen on CT  scan.  Patient did have some headaches after this episode which appear to have improved however his confusion and cognitive decline have persisted.  Patient does not have any family history of Alzheimer's.  There is no prior history of strokes, seizures, loss of consciousness or major head injury with loss of consciousness.  Patient has never had any lab work for reversible causes of dementia or a  trial of medication like Aricept or Namenda.  He continues to have poor appetite and his intake is not good though his wife has convinced him to take Glucerna nutritional supplement.  There are no delusions, hallucinations, agitations, unsafe behavior.  He can walk independently and has not had recurrent falls.   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   Warfarin anticoagulation     Social History:  Social History   Socioeconomic History   Marital status: Married    Spouse name: Not on file   Number of children: Not on file   Years of education: 11   Highest education level: Not on file  Occupational History   Not on file  Tobacco Use   Smoking status: Never   Smokeless tobacco: Never  Vaping Use   Vaping Use: Never used  Substance and Sexual Activity   Alcohol use: No    Alcohol/week: 0.0 standard drinks   Drug use: No   Sexual activity: Not on file  Other Topics Concern   Not on file  Social History Narrative   Not on file   Social Determinants of Health   Financial Resource Strain: Not on file  Food Insecurity: Not on file  Transportation Needs: Not on file  Physical Activity: Not on file  Stress: Not on file  Social Connections: Not on file  Intimate Partner Violence: Not on file    Medications:   Current Outpatient Medications on File Prior to Visit  Medication Sig Dispense Refill   allopurinol (ZYLOPRIM) 100 MG tablet  Take 100 mg by mouth Daily.      Ascorbic Acid 500 MG TBCR Take 1 tablet by mouth daily.     BD INSULIN SYRINGE U/F 31G X 5/16" 0.3 ML MISC      Calcium Carb-Cholecalciferol (CALCIUM 1000 + D PO) Take 1 tablet by mouth daily with breakfast.     cholecalciferol (VITAMIN D3) 25 MCG (1000 UNIT) tablet Take 1,000 Units by mouth daily.     colchicine 0.6 MG tablet Take 0.6 mg by mouth as needed.      ELIQUIS 5 MG TABS tablet Take 5 mg by mouth 2 (two) times daily.     famotidine (PEPCID) 20 MG tablet Take 20 mg by mouth 2 (two) times daily.     fish oil-omega-3 fatty acids 1000 MG capsule Take 1 g by mouth 2 (two) times daily.  insulin lispro (HUMALOG) 100 UNIT/ML injection Inject into the skin.     isosorbide mononitrate (IMDUR) 60 MG 24 hr tablet Take 1 tablet (60 mg total) by mouth daily. 30 tablet 6   lisinopril (ZESTRIL) 10 MG tablet Take 10 mg by mouth every evening.      metFORMIN (GLUCOPHAGE) 1000 MG tablet Take 1,000 mg by mouth 2 (two) times daily with a meal.     metoprolol succinate (TOPROL-XL) 25 MG 24 hr tablet TAKE 1 TABLET BY MOUTH  DAILY 90 tablet 3   nitroGLYCERIN (NITROSTAT) 0.4 MG SL tablet Place 1 tablet (0.4 mg total) under the tongue every 5 (five) minutes x 3 doses as needed. 25 tablet 3   ONETOUCH VERIO test strip      sertraline (ZOLOFT) 100 MG tablet Take 100 mg by mouth daily.     simvastatin (ZOCOR) 40 MG tablet Take 40 mg by mouth at bedtime.     tamsulosin (FLOMAX) 0.4 MG CAPS capsule Take 0.4 mg by mouth daily.     zinc gluconate 50 MG tablet Take 50 mg by mouth daily.     No current facility-administered medications on file prior to visit.    Allergies:   Allergies  Allergen Reactions   Contrast Media [Iodinated Diagnostic Agents]    Shellfish Allergy Itching and Rash   Sulfonamide Derivatives Itching and Rash    Physical Exam Today's Vitals   01/13/21 1244  BP: (!) 108/58  Pulse: 60  Weight: 200 lb (90.7 kg)  Height: 6' (1.829 m)   Body mass  index is 27.12 kg/m.   General: well developed, well nourished pleasant elderly Caucasian male, seated, in no evident distress Head: head normocephalic and atraumatic.   Neck: supple with no carotid or supraclavicular bruits Cardiovascular: regular rate and rhythm, no murmurs Musculoskeletal: no deformity Skin:  no rash/petichiae Vascular:  Normal pulses all extremities  Neurologic Exam Mental Status: Awake and fully alert. Oriented to place and time. Recent memory impaired and remote memory intact. Attention span, concentration and fund of knowledge impaired with wife providing majority of history. Mood and affect appropriate.   MMSE - Mini Mental State Exam 01/13/2021 01/13/2020 12/05/2018  Orientation to time '2 3 3  ' Orientation to Place '4 4 4  ' Registration '2 3 3  ' Attention/ Calculation 0 1 0  Recall 1 0 2  Language- name 2 objects '2 2 2  ' Language- repeat 0 0 1  Language- follow 3 step command '3 3 3  ' Language- read & follow direction 1 0 1  Write a sentence 0 1 1  Copy design 0 0 0  Total score '15 17 20   ' Cranial Nerves: Pupils equal, briskly reactive to light. Extraocular movements full without nystagmus. Visual fields full to confrontation. Hearing reduced significantly in the right ear.  Facial sensation intact. Face, tongue, palate moves normally and symmetrically.  Motor: Normal bulk and tone. Normal strength in all tested extremity muscles. Sensory.: intact to touch , pinprick , position and vibratory sensation.  Coordination: Rapid alternating movements normal in all extremities. Finger-to-nose and heel-to-shin performed accurately bilaterally. Gait and Station: Arises from chair without difficulty. Stance is normal. Gait demonstrates normal stride length and balance with use of walking stick. Able to heel, toe and tandem walk with mild difficulty.  Reflexes: 1+ and symmetric. Toes downgoing.       ASSESSMENT/PLAN: 75 year old Caucasian male initially consulted 11/2018 with  what appears to be mild baseline dementia with recent fall head injury and  postconcussion worsening of his cognitive difficulties from Alzheimer's. Per patient and wife, he has been stable without worsening over the past year.  Dementia panel within normal limits.  EEG showed mild slowing consistent with memory loss and dementia without evidence of seizures.    -MMSE today 15/30 (prior 17/30) -Continue Namenda 10 mg twice daily -refill provided -Discussed potentially adding Aricept in additional to [place] but will hold off at this time as subjectively cognition has been stable- advised to call in the future with any concerns or worsening dementia -Continue to follow with PCP and cardiology as scheduled for monitoring management of HTN, HLD, DM and atrial fibrillation   Follow-up in 1 year or call earlier if needed   I spent 31 minutes of face-to-face and non-face-to-face time with patient and wife.  This included previsit chart review, lab review, study review, order entry, electronic health record documentation, patient education and discussion regarding Alzheimer's dementia, completion and review of MMSE, ongoing use of Namenda and potential use of Aricept and answered all questions to patient and wife satisfaction  Frann Rider, AGNP-BC  [facility] [address] [place], [place] [address]  Phone [phone] Fax [phone] Note: This document was prepared with digital dictation and possible smart phrase technology. Any transcriptional errors that result from this process are unintentional.

## 2021-02-23 DIAGNOSIS — H401234 Low-tension glaucoma, bilateral, indeterminate stage: Secondary | ICD-10-CM | POA: Diagnosis not present

## 2021-02-23 DIAGNOSIS — H524 Presbyopia: Secondary | ICD-10-CM | POA: Diagnosis not present

## 2021-04-13 DIAGNOSIS — F32A Depression, unspecified: Secondary | ICD-10-CM | POA: Diagnosis not present

## 2021-04-16 ENCOUNTER — Other Ambulatory Visit: Payer: Self-pay

## 2021-04-16 ENCOUNTER — Ambulatory Visit: Payer: Medicare Other | Admitting: Internal Medicine

## 2021-04-16 ENCOUNTER — Encounter: Payer: Medicare Other | Admitting: Internal Medicine

## 2021-04-16 ENCOUNTER — Encounter: Payer: Self-pay | Admitting: Internal Medicine

## 2021-04-16 VITALS — BP 108/58 | Ht 72.0 in | Wt 197.0 lb

## 2021-04-16 DIAGNOSIS — I10 Essential (primary) hypertension: Secondary | ICD-10-CM | POA: Diagnosis not present

## 2021-04-16 DIAGNOSIS — I442 Atrioventricular block, complete: Secondary | ICD-10-CM

## 2021-04-16 DIAGNOSIS — D6869 Other thrombophilia: Secondary | ICD-10-CM

## 2021-04-16 DIAGNOSIS — I4821 Permanent atrial fibrillation: Secondary | ICD-10-CM

## 2021-04-16 LAB — CUP PACEART INCLINIC DEVICE CHECK
Battery Remaining Longevity: 50 mo
Battery Voltage: 2.98 V
Brady Statistic RV Percent Paced: 99.99 %
Date Time Interrogation Session: 20230203153658
Implantable Lead Implant Date: 20020215
Implantable Lead Location: 753860
Implantable Pulse Generator Implant Date: 20141121
Lead Channel Impedance Value: 637.5 Ohm
Lead Channel Pacing Threshold Amplitude: 1 V
Lead Channel Pacing Threshold Pulse Width: 0.4 ms
Lead Channel Sensing Intrinsic Amplitude: 12 mV
Lead Channel Setting Pacing Amplitude: 1.25 V
Lead Channel Setting Pacing Pulse Width: 0.4 ms
Lead Channel Setting Sensing Sensitivity: 6 mV
Pulse Gen Model: 1240
Pulse Gen Serial Number: 7488472

## 2021-04-16 NOTE — Progress Notes (Signed)
PCP: Monico Blitz, MD   Primary EP:  Dr Rayann Heman  Jay Campbell is a 76 y.o. male who presents today for routine electrophysiology followup.  Since last being seen in our clinic, the patient reports doing very well.  Today, he denies symptoms of palpitations, chest pain, shortness of breath,  lower extremity edema, dizziness, presyncope, or syncope.  He has had some unsteadiness and falls. The patient is otherwise without complaint today.   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   Warfarin anticoagulation    Past Surgical History:  Procedure Laterality Date   CATARACT EXTRACTION Right    COCHLEAR IMPLANT Right    HEMORRHOID BANDING     KNEE ARTHROSCOPY Right 1992   Dr Luna Glasgow    PACEMAKER GENERATOR CHANGE Bilateral 02/01/2013   Procedure: PACEMAKER GENERATOR CHANGE;  Surgeon: Coralyn Mark, MD;  [facility];  Service: Cardiovascular;  Laterality: Bilateral;   PACEMAKER PLACEMENT  2002; 2014   SJM implanted by Dr Lovena Le with AV nodal ablation performed; gen change to [employer] SR RF by Dr Rayann Heman 02-01-13   RETINAL LASER PROCEDURE Left    SLT LASER APPLICATION Left 04/18/8525   Procedure: SLT LASER APPLICATION;  Surgeon: Williams Che, MD;  Location: [facility]S;  Service: Ophthalmology;  Laterality: Left;    ROS- all systems are reviewed and negative except as per HPI above  Current Outpatient Medications  Medication Sig Dispense Refill   allopurinol (ZYLOPRIM) 100 MG tablet Take 100 mg by mouth Daily.      Ascorbic Acid 500 MG TBCR Take 1  tablet by mouth daily.     BD INSULIN SYRINGE U/F 31G X 5/16" 0.3 ML MISC      Calcium Carb-Cholecalciferol (CALCIUM 1000 + D PO) Take 1 tablet by mouth daily with breakfast.     cholecalciferol (VITAMIN D3) 25 MCG (1000 UNIT) tablet Take 1,000 Units by mouth daily.     colchicine 0.6 MG tablet Take 0.6 mg by mouth as needed.      ELIQUIS 5 MG TABS tablet Take 5 mg by mouth 2 (two) times daily.     famotidine (PEPCID) 20 MG tablet Take 20 mg by mouth 2 (two) times daily.     fish oil-omega-3 fatty acids 1000 MG capsule Take 1 g by mouth 2 (two) times daily.     insulin lispro (HUMALOG) 100 UNIT/ML injection Inject into the skin.     isosorbide mononitrate (IMDUR) 60 MG 24 hr tablet Take 1 tablet (60 mg total) by mouth daily. 30 tablet 6   lisinopril (ZESTRIL) 10 MG tablet Take 10 mg by mouth every evening.      memantine (NAMENDA) 10 MG tablet Take 1 tablet (10 mg total) by mouth 2 (two) times daily. 180 tablet 3   metFORMIN (GLUCOPHAGE) 1000 MG tablet Take 1,000 mg by mouth 2 (two) times daily with a meal.     metoprolol succinate (TOPROL-XL) 25 MG  24 hr tablet TAKE 1 TABLET BY MOUTH  DAILY 90 tablet 3   nitroGLYCERIN (NITROSTAT) 0.4 MG SL tablet Place 1 tablet (0.4 mg total) under the tongue every 5 (five) minutes x 3 doses as needed. 25 tablet 3   ONETOUCH VERIO test strip      sertraline (ZOLOFT) 100 MG tablet Take 100 mg by mouth daily.     simvastatin (ZOCOR) 40 MG tablet Take 40 mg by mouth at bedtime.     tamsulosin (FLOMAX) 0.4 MG CAPS capsule Take 0.4 mg by mouth daily.     zinc gluconate 50 MG tablet Take 50 mg by mouth daily.     No current facility-administered medications for this visit.    Physical Exam: Vitals:   04/16/21 1511  BP: (!) 108/58  SpO2: 94%  Weight: 197 lb (89.4 kg)  Height: 6' (1.829 m)    GEN- The patient is elderly appearing, alert and oriented x 3 today.   Head- normocephalic, atraumatic Eyes-  Sclera clear, conjunctiva pink Ears- hearing  intact Oropharynx- clear Lungs- Clear to ausculation bilaterally, normal work of breathing Chest- pacemaker pocket is well healed Heart- Regular rate and rhythm (paced) GI- soft, NT, ND, + BS Extremities- no clubbing, cyanosis, or edema  Pacemaker interrogation- reviewed in detail today,  See PACEART report  ekg tracing ordered today is personally reviewed and shows afib, V pacing  Assessment and Plan:  1. Complete heart block s/p AV nodal ablation Normal pacemaker function See Pace Art report No changes today he is device dependant today  2. Permanent afib Rate controlled On long term [place]  3. HTN Stable No change required today  Return in a year  Thompson Grayer MD, [facility] 04/16/2021 3:23 PM

## 2021-04-16 NOTE — Patient Instructions (Signed)
Medication Instructions:  Continue all current medications.  Labwork: none  Testing/Procedures: none  Follow-Up: 1 year - Dr.  Allred   Any Other Special Instructions Will Be Listed Below (If Applicable).   If you need a refill on your cardiac medications before your next appointment, please call your pharmacy.  

## 2021-04-29 ENCOUNTER — Inpatient Hospital Stay (HOSPITAL_COMMUNITY): Payer: Medicare Other

## 2021-05-06 ENCOUNTER — Ambulatory Visit (HOSPITAL_COMMUNITY): Payer: Medicare Other | Admitting: Hematology

## 2021-05-25 DIAGNOSIS — E162 Hypoglycemia, unspecified: Secondary | ICD-10-CM | POA: Diagnosis not present

## 2021-05-25 DIAGNOSIS — R4020 Unspecified coma: Secondary | ICD-10-CM | POA: Diagnosis not present

## 2021-05-25 DIAGNOSIS — E161 Other hypoglycemia: Secondary | ICD-10-CM | POA: Diagnosis not present

## 2021-06-02 ENCOUNTER — Inpatient Hospital Stay (HOSPITAL_COMMUNITY): Payer: Medicare Other | Attending: Hematology

## 2021-06-02 DIAGNOSIS — Z836 Family history of other diseases of the respiratory system: Secondary | ICD-10-CM | POA: Insufficient documentation

## 2021-06-02 DIAGNOSIS — F32A Depression, unspecified: Secondary | ICD-10-CM | POA: Diagnosis not present

## 2021-06-02 DIAGNOSIS — Z801 Family history of malignant neoplasm of trachea, bronchus and lung: Secondary | ICD-10-CM | POA: Diagnosis not present

## 2021-06-02 DIAGNOSIS — R410 Disorientation, unspecified: Secondary | ICD-10-CM | POA: Diagnosis not present

## 2021-06-02 DIAGNOSIS — I4891 Unspecified atrial fibrillation: Secondary | ICD-10-CM | POA: Diagnosis not present

## 2021-06-02 DIAGNOSIS — Z7901 Long term (current) use of anticoagulants: Secondary | ICD-10-CM | POA: Insufficient documentation

## 2021-06-02 DIAGNOSIS — Z882 Allergy status to sulfonamides status: Secondary | ICD-10-CM | POA: Diagnosis not present

## 2021-06-02 DIAGNOSIS — I10 Essential (primary) hypertension: Secondary | ICD-10-CM | POA: Diagnosis not present

## 2021-06-02 DIAGNOSIS — R42 Dizziness and giddiness: Secondary | ICD-10-CM | POA: Diagnosis not present

## 2021-06-02 DIAGNOSIS — D472 Monoclonal gammopathy: Secondary | ICD-10-CM | POA: Diagnosis not present

## 2021-06-02 DIAGNOSIS — M549 Dorsalgia, unspecified: Secondary | ICD-10-CM | POA: Insufficient documentation

## 2021-06-02 DIAGNOSIS — Z79899 Other long term (current) drug therapy: Secondary | ICD-10-CM | POA: Diagnosis not present

## 2021-06-02 DIAGNOSIS — E1129 Type 2 diabetes mellitus with other diabetic kidney complication: Secondary | ICD-10-CM | POA: Diagnosis not present

## 2021-06-02 DIAGNOSIS — R5383 Other fatigue: Secondary | ICD-10-CM | POA: Diagnosis not present

## 2021-06-02 DIAGNOSIS — E1165 Type 2 diabetes mellitus with hyperglycemia: Secondary | ICD-10-CM | POA: Diagnosis not present

## 2021-06-02 DIAGNOSIS — D696 Thrombocytopenia, unspecified: Secondary | ICD-10-CM | POA: Diagnosis not present

## 2021-06-02 DIAGNOSIS — R809 Proteinuria, unspecified: Secondary | ICD-10-CM | POA: Diagnosis not present

## 2021-06-02 DIAGNOSIS — Z8719 Personal history of other diseases of the digestive system: Secondary | ICD-10-CM | POA: Insufficient documentation

## 2021-06-02 DIAGNOSIS — D539 Nutritional anemia, unspecified: Secondary | ICD-10-CM | POA: Insufficient documentation

## 2021-06-02 DIAGNOSIS — Z8249 Family history of ischemic heart disease and other diseases of the circulatory system: Secondary | ICD-10-CM | POA: Diagnosis not present

## 2021-06-02 DIAGNOSIS — Z299 Encounter for prophylactic measures, unspecified: Secondary | ICD-10-CM | POA: Diagnosis not present

## 2021-06-02 DIAGNOSIS — Z789 Other specified health status: Secondary | ICD-10-CM | POA: Diagnosis not present

## 2021-06-02 LAB — IRON AND TIBC
Iron: 36 ug/dL — ABNORMAL LOW (ref 45–182)
Saturation Ratios: 12 % — ABNORMAL LOW (ref 17.9–39.5)
TIBC: 309 ug/dL (ref 250–450)
UIBC: 273 ug/dL

## 2021-06-02 LAB — CBC WITH DIFFERENTIAL/PLATELET
Abs Immature Granulocytes: 0.01 10*3/uL (ref 0.00–0.07)
Basophils Absolute: 0 10*3/uL (ref 0.0–0.1)
Basophils Relative: 0 %
Eosinophils Absolute: 0.1 10*3/uL (ref 0.0–0.5)
Eosinophils Relative: 1 %
HCT: 35.8 % — ABNORMAL LOW (ref 39.0–52.0)
Hemoglobin: 12.3 g/dL — ABNORMAL LOW (ref 13.0–17.0)
Immature Granulocytes: 0 %
Lymphocytes Relative: 17 %
Lymphs Abs: 0.9 10*3/uL (ref 0.7–4.0)
MCH: 36.7 pg — ABNORMAL HIGH (ref 26.0–34.0)
MCHC: 34.4 g/dL (ref 30.0–36.0)
MCV: 106.9 fL — ABNORMAL HIGH (ref 80.0–100.0)
Monocytes Absolute: 0.4 10*3/uL (ref 0.1–1.0)
Monocytes Relative: 8 %
Neutro Abs: 3.6 10*3/uL (ref 1.7–7.7)
Neutrophils Relative %: 74 %
Platelets: 147 10*3/uL — ABNORMAL LOW (ref 150–400)
RBC: 3.35 MIL/uL — ABNORMAL LOW (ref 4.22–5.81)
RDW: 13.2 % (ref 11.5–15.5)
WBC: 5 10*3/uL (ref 4.0–10.5)
nRBC: 0 % (ref 0.0–0.2)

## 2021-06-02 LAB — FERRITIN: Ferritin: 198 ng/mL (ref 24–336)

## 2021-06-02 LAB — COMPREHENSIVE METABOLIC PANEL
ALT: 20 U/L (ref 0–44)
AST: 25 U/L (ref 15–41)
Albumin: 3.9 g/dL (ref 3.5–5.0)
Alkaline Phosphatase: 124 U/L (ref 38–126)
Anion gap: 11 (ref 5–15)
BUN: 28 mg/dL — ABNORMAL HIGH (ref 8–23)
CO2: 24 mmol/L (ref 22–32)
Calcium: 9.1 mg/dL (ref 8.9–10.3)
Chloride: 103 mmol/L (ref 98–111)
Creatinine, Ser: 1.19 mg/dL (ref 0.61–1.24)
GFR, Estimated: >60 mL/min (ref >60)
Glucose, Bld: 156 mg/dL — ABNORMAL HIGH (ref 70–99)
Potassium: 4.2 mmol/L (ref 3.5–5.1)
Sodium: 138 mmol/L (ref 135–145)
Total Bilirubin: 1.1 mg/dL (ref 0.3–1.2)
Total Protein: 8.3 g/dL — ABNORMAL HIGH (ref 6.5–8.1)

## 2021-06-02 LAB — LACTATE DEHYDROGENASE: LDH: 161 U/L (ref 98–192)

## 2021-06-04 LAB — PROTEIN ELECTROPHORESIS, SERUM
A/G Ratio: 1 (ref 0.7–1.7)
Albumin ELP: 3.8 g/dL (ref 2.9–4.4)
Alpha-1-Globulin: 0.3 g/dL (ref 0.0–0.4)
Alpha-2-Globulin: 0.9 g/dL (ref 0.4–1.0)
Beta Globulin: 0.9 g/dL (ref 0.7–1.3)
Gamma Globulin: 2 g/dL — ABNORMAL HIGH (ref 0.4–1.8)
Globulin, Total: 4 g/dL — ABNORMAL HIGH (ref 2.2–3.9)
M-Spike, %: 1.4 g/dL — ABNORMAL HIGH
Total Protein ELP: 7.8 g/dL (ref 6.0–8.5)

## 2021-06-07 LAB — KAPPA/LAMBDA LIGHT CHAINS
Kappa free light chain: 62.5 mg/L — ABNORMAL HIGH (ref 3.3–19.4)
Kappa, lambda light chain ratio: 2.31 — ABNORMAL HIGH (ref 0.26–1.65)
Lambda free light chains: 27 mg/L — ABNORMAL HIGH (ref 5.7–26.3)

## 2021-06-09 ENCOUNTER — Inpatient Hospital Stay (HOSPITAL_BASED_OUTPATIENT_CLINIC_OR_DEPARTMENT_OTHER): Payer: Medicare Other | Admitting: Hematology

## 2021-06-09 VITALS — BP 146/71 | HR 63 | Temp 98.0°F | Resp 18 | Ht 72.0 in | Wt 175.3 lb

## 2021-06-09 DIAGNOSIS — D539 Nutritional anemia, unspecified: Secondary | ICD-10-CM

## 2021-06-09 DIAGNOSIS — Z801 Family history of malignant neoplasm of trachea, bronchus and lung: Secondary | ICD-10-CM | POA: Diagnosis not present

## 2021-06-09 DIAGNOSIS — F32A Depression, unspecified: Secondary | ICD-10-CM | POA: Diagnosis not present

## 2021-06-09 DIAGNOSIS — I4891 Unspecified atrial fibrillation: Secondary | ICD-10-CM | POA: Diagnosis not present

## 2021-06-09 DIAGNOSIS — D472 Monoclonal gammopathy: Secondary | ICD-10-CM | POA: Diagnosis not present

## 2021-06-09 DIAGNOSIS — R410 Disorientation, unspecified: Secondary | ICD-10-CM | POA: Diagnosis not present

## 2021-06-09 DIAGNOSIS — M549 Dorsalgia, unspecified: Secondary | ICD-10-CM | POA: Diagnosis not present

## 2021-06-09 DIAGNOSIS — R5383 Other fatigue: Secondary | ICD-10-CM | POA: Diagnosis not present

## 2021-06-09 DIAGNOSIS — Z7901 Long term (current) use of anticoagulants: Secondary | ICD-10-CM | POA: Diagnosis not present

## 2021-06-09 DIAGNOSIS — D696 Thrombocytopenia, unspecified: Secondary | ICD-10-CM | POA: Diagnosis not present

## 2021-06-09 DIAGNOSIS — Z8719 Personal history of other diseases of the digestive system: Secondary | ICD-10-CM | POA: Diagnosis not present

## 2021-06-09 DIAGNOSIS — Z882 Allergy status to sulfonamides status: Secondary | ICD-10-CM | POA: Diagnosis not present

## 2021-06-09 DIAGNOSIS — Z836 Family history of other diseases of the respiratory system: Secondary | ICD-10-CM | POA: Diagnosis not present

## 2021-06-09 DIAGNOSIS — Z79899 Other long term (current) drug therapy: Secondary | ICD-10-CM | POA: Diagnosis not present

## 2021-06-09 DIAGNOSIS — Z8249 Family history of ischemic heart disease and other diseases of the circulatory system: Secondary | ICD-10-CM | POA: Diagnosis not present

## 2021-06-09 DIAGNOSIS — R42 Dizziness and giddiness: Secondary | ICD-10-CM | POA: Diagnosis not present

## 2021-06-09 NOTE — Progress Notes (Signed)
? ?[facility] ?[address] ?[place], [place] [address] ? ? ?CLINIC:  ?Medical Oncology/Hematology ? ?PCP:  ?Monico Blitz, MD ?[address] [place] [address]  ?[phone] ? ?REASON FOR VISIT:  ?Follow-up for smoldering multiple myeloma, macrocytic anemia and thrombocytopenia ? ?PRIOR THERAPY: none ? ?CURRENT THERAPY: surveillance ? ?INTERVAL HISTORY:  ?[name], a 76 y.o. male, returns for routine follow-up for his smoldering multiple myeloma, macrocytic anemia and thrombocytopenia. [name] was last seen on 10/28/2020. ? ?Today he reports feeling fair, and he is accompanied by two of his children. He has no appetite, and his dementia has worsened rapidly over the past few months. He is in PT. He denies current pains. He has lost 22 lbs since 04/16/2021.  ? ?REVIEW OF SYSTEMS:  ?Review of Systems  ?Constitutional:  Positive for fatigue and unexpected weight change (-22 lbs). Negative for appetite change.  ?Musculoskeletal:  Positive for back pain (5/10).  ?Neurological:  Positive for dizziness.  ?Psychiatric/Behavioral:  Positive for confusion and depression. The patient is nervous/anxious.   ?All other systems reviewed and are negative. ? ?PAST MEDICAL/SURGICAL HISTORY:  ?Past Medical History:  ?Diagnosis Date  ? Alzheimer's dementia ([facility])   ? Anxiety   ? Atrial fibrillation ([facility])   ? Permanent  ? Coronary artery disease   ? Mild nonobstructive 1/12  ? DM (diabetes mellitus) ([facility])   ? GERD (gastroesophageal reflux disease)   ? Gout   ? Hearing disorder, cochlear   ? Hiatal hernia   ? HLD (hyperlipidemia)   ? HTN (hypertension)   ? S/P AV nodal ablation   ? s/p SJM PPM; gen change 02-01-13 by Dr Rayann Heman  ? 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  ? Warfarin anticoagulation   ? ?Past Surgical History:  ?Procedure Laterality Date  ? CATARACT EXTRACTION Right   ? COCHLEAR IMPLANT Right   ? HEMORRHOID BANDING    ? KNEE ARTHROSCOPY Right 1992  ? Dr Luna Glasgow   ? PACEMAKER GENERATOR CHANGE Bilateral  02/01/2013  ? Procedure: PACEMAKER GENERATOR CHANGE;  Surgeon: Coralyn Mark, MD;  Location: [facility] CATH [facility];  Service: Cardiovascular;  Laterality: Bilateral;  ? PACEMAKER PLACEMENT  2002; 2014  ? SJM implanted by Dr Lovena Le with AV nodal ablation performed; gen change to [employer] SR RF by Dr Rayann Heman 02-01-13  ? RETINAL LASER PROCEDURE Left   ? SLT LASER APPLICATION Left 60/03/930  ? Procedure: SLT LASER APPLICATION;  Surgeon: Williams Che, MD;  Location: [facility];  Service: Ophthalmology;  Laterality: Left;  ? ? ?SOCIAL HISTORY:  ?Social History  ? ?Socioeconomic History  ? Marital status: Married  ?  Spouse name: Not on file  ? Number of children: Not on file  ? Years of education: 2  ? Highest education level: Not on file  ?Occupational History  ? Not on file  ?Tobacco Use  ? Smoking status: Never  ? Smokeless tobacco: Never  ?Vaping Use  ? Vaping Use: Never used  ?Substance and Sexual Activity  ? Alcohol use: No  ?  Alcohol/week: 0.0 standard drinks  ? Drug use: No  ? Sexual activity: Not on file  ?Other Topics Concern  ? Not on file  ?Social History Narrative  ? Not on file  ? ?Social Determinants of Health  ? ?Financial Resource Strain: Not on file  ?Food Insecurity: Not on file  ?Transportation Needs: Not on file  ?Physical Activity: Not on file  ?Stress: Not on file  ?Social Connections: Not on  file  ?Intimate Partner Violence: Not on file  ? ? ?FAMILY HISTORY:  ?Family History  ?Problem Relation Age of Onset  ? COPD Mother   ? Emphysema Mother   ? Cancer - Lung Father   ? Heart disease Paternal Uncle   ? Stomach cancer Neg Hx   ? Colon cancer Neg Hx   ? ? ?CURRENT MEDICATIONS:  ?Current Outpatient Medications  ?Medication Sig Dispense Refill  ? allopurinol (ZYLOPRIM) 100 MG tablet Take 100 mg by mouth Daily.     ? Ascorbic Acid 500 MG TBCR Take 1 tablet by mouth daily.    ? BD INSULIN SYRINGE U/F 31G X 5/16" 0.3 ML MISC     ? Calcium Carb-Cholecalciferol (CALCIUM 1000 + D PO) Take 1 tablet by mouth daily with  breakfast.    ? cholecalciferol (VITAMIN D3) 25 MCG (1000 UNIT) tablet Take 1,000 Units by mouth daily.    ? colchicine 0.6 MG tablet Take 0.6 mg by mouth as needed.     ? ELIQUIS 5 MG TABS tablet Take 5 mg by mouth 2 (two) times daily.    ? famotidine (PEPCID) 20 MG tablet Take 20 mg by mouth 2 (two) times daily.    ? fish oil-omega-3 fatty acids 1000 MG capsule Take 1 g by mouth 2 (two) times daily.    ? insulin lispro (HUMALOG) 100 UNIT/ML injection Inject into the skin.    ? isosorbide mononitrate (IMDUR) 60 MG 24 hr tablet Take 1 tablet (60 mg total) by mouth daily. 30 tablet 6  ? lisinopril (ZESTRIL) 10 MG tablet Take 10 mg by mouth every evening.     ? memantine (NAMENDA) 10 MG tablet Take 1 tablet (10 mg total) by mouth 2 (two) times daily. 180 tablet 3  ? metFORMIN (GLUCOPHAGE) 1000 MG tablet Take 1,000 mg by mouth 2 (two) times daily with a meal.    ? metoprolol succinate (TOPROL-XL) 25 MG 24 hr tablet TAKE 1 TABLET BY MOUTH  DAILY 90 tablet 3  ? nitroGLYCERIN (NITROSTAT) 0.4 MG SL tablet Place 1 tablet (0.4 mg total) under the tongue every 5 (five) minutes x 3 doses as needed. 25 tablet 3  ? ONETOUCH VERIO test strip     ? sertraline (ZOLOFT) 100 MG tablet Take 100 mg by mouth daily.    ? simvastatin (ZOCOR) 40 MG tablet Take 40 mg by mouth at bedtime.    ? tamsulosin (FLOMAX) 0.4 MG CAPS capsule Take 0.4 mg by mouth daily.    ? zinc gluconate 50 MG tablet Take 50 mg by mouth daily.    ? ?No current facility-administered medications for this visit.  ? ? ?ALLERGIES:  ?Allergies  ?Allergen Reactions  ? Contrast Media [Iodinated Contrast Media]   ? Shellfish Allergy Itching and Rash  ? Sulfonamide Derivatives Itching and Rash  ? ? ?PHYSICAL EXAM:  ?Performance status (ECOG): 1 - Symptomatic but completely ambulatory ? ?There were no vitals filed for this visit. ?Wt Readings from Last 3 Encounters:  ?04/16/21 197 lb (89.4 kg)  ?01/13/21 200 lb (90.7 kg)  ?10/28/20 203 lb (92.1 kg)  ? ?Physical Exam ?Vitals  reviewed.  ?Constitutional:   ?   Appearance: Normal appearance.  ?Cardiovascular:  ?   Rate and Rhythm: Normal rate and regular rhythm.  ?   Pulses: Normal pulses.  ?   Heart sounds: Normal heart sounds.  ?Pulmonary:  ?   Effort: Pulmonary effort is normal.  ?   Breath sounds: Normal breath  sounds.  ?Neurological:  ?   General: No focal deficit present.  ?   Mental Status: He is alert. He is disoriented.  ?Psychiatric:     ?   Mood and Affect: Mood normal.     ?   Behavior: Behavior normal.  ? ? ?LABORATORY DATA:  ?I have reviewed the labs as listed.  ? ?  Latest Ref Rng & Units 06/02/2021  ? 12:47 PM 10/21/2020  ? 10:39 AM 04/16/2020  ? 11:13 AM  ?CBC  ?WBC 4.0 - 10.5 K/uL 5.0   4.4   3.8    ?Hemoglobin 13.0 - 17.0 g/dL 12.3   11.3   12.1    ?Hematocrit 39.0 - 52.0 % 35.8   32.6   36.1    ?Platelets 150 - 400 K/uL 147   104   99    ? ? ?  Latest Ref Rng & Units 06/02/2021  ? 12:47 PM 10/21/2020  ? 10:39 AM 04/16/2020  ? 11:13 AM  ?CMP  ?Glucose 70 - 99 mg/dL 156   294   176    ?BUN 8 - 23 mg/dL '28   25   29    ' ?Creatinine 0.61 - 1.24 mg/dL 1.19   1.22   1.24    ?Sodium 135 - 145 mmol/L 138   132   136    ?Potassium 3.5 - 5.1 mmol/L 4.2   4.5   4.8    ?Chloride 98 - 111 mmol/L 103   104   106    ?CO2 22 - 32 mmol/L '24   23   26    ' ?Calcium 8.9 - 10.3 mg/dL 9.1   8.9   9.3    ?Total Protein 6.5 - 8.1 g/dL 8.3   7.7   7.7    ?Total Bilirubin 0.3 - 1.2 mg/dL 1.1   0.9   1.1    ?Alkaline Phos 38 - 126 U/L 124   115   102    ?AST 15 - 41 U/L 25   24   32    ?ALT 0 - 44 U/L '20   19   28    ' ? ?   ?Component Value Date/Time  ? RBC 3.35 (L) 06/02/2021 1247  ? MCV 106.9 (H) 06/02/2021 1247  ? MCH 36.7 (H) 06/02/2021 1247  ? MCHC 34.4 06/02/2021 1247  ? RDW 13.2 06/02/2021 1247  ? LYMPHSABS 0.9 06/02/2021 1247  ? MONOABS 0.4 06/02/2021 1247  ? EOSABS 0.1 06/02/2021 1247  ? BASOSABS 0.0 06/02/2021 1247  ? ? ?DIAGNOSTIC IMAGING:  ?I have independently reviewed the scans and discussed with the patient. ?No results found.  ? ?ASSESSMENT:   ?1.  IgA kappa smoldering myeloma: ?-Bone marrow biopsy on 09/05/2017 shows 12% plasma cells. ?-Skeletal survey on 11/29/2019 was negative for lytic lesions. ?  ?2.  Macrocytic anemia: ?-Hemoglobin is 1

## 2021-06-09 NOTE — Patient Instructions (Signed)
[facility] at [facility] ?Discharge Instructions ? ?You were seen and examined today by Dr. Delton Coombes. He reviewed your most recent labs and everything looks good. Please keep follow up appointment as scheduled in 6 months. If you decide that you do not want to follow up please call our office. ? ? ?Thank you for choosing [place] at [facility] to provide your oncology and hematology care.  To afford each patient quality time with our provider, please arrive at least 15 minutes before your scheduled appointment time.  ? ?If you have a lab appointment with the [facility] please come in thru the Main Entrance and check in at the main information desk. ? ?You need to re-schedule your appointment should you arrive 10 or more minutes late.  We strive to give you quality time with our providers, and arriving late affects you and other patients whose appointments are after yours.  Also, if you no show three or more times for appointments you may be dismissed from the clinic at the providers discretion.     ?Again, thank you for choosing [facility].  Our hope is that these requests will decrease the amount of time that you wait before being seen by our physicians.       ?_____________________________________________________________ ? ?Should you have questions after your visit to [facility], please contact our office at [phone] and follow the prompts.  Our office hours are 8:00 a.m. and 4:30 p.m. Monday - Friday.  Please note that voicemails left after 4:00 p.m. may not be returned until the following business day.  We are closed weekends and major holidays.  You do have access to a nurse 24-7, just call the main number to the clinic [phone] and do not press any options, hold on the line and a nurse will answer the phone.   ? ?For prescription refill requests, have your pharmacy contact our office and allow 72 hours.   ? ?Due to  Covid, you will need to wear a mask upon entering the hospital. If you do not have a mask, a mask will be given to you at the Main Entrance upon arrival. For doctor visits, patients may have 1 support person age 80 or older with them. For treatment visits, patients can not have anyone with them due to social distancing guidelines and our immunocompromised population.  ? ?  ?

## 2021-06-10 DIAGNOSIS — F32A Depression, unspecified: Secondary | ICD-10-CM | POA: Diagnosis not present

## 2021-06-22 DIAGNOSIS — I10 Essential (primary) hypertension: Secondary | ICD-10-CM | POA: Diagnosis not present

## 2021-06-22 DIAGNOSIS — F32A Depression, unspecified: Secondary | ICD-10-CM | POA: Diagnosis not present

## 2021-06-22 DIAGNOSIS — Z299 Encounter for prophylactic measures, unspecified: Secondary | ICD-10-CM | POA: Diagnosis not present

## 2021-06-22 DIAGNOSIS — M545 Low back pain, unspecified: Secondary | ICD-10-CM | POA: Diagnosis not present

## 2021-06-29 DIAGNOSIS — F32A Depression, unspecified: Secondary | ICD-10-CM | POA: Diagnosis not present

## 2021-06-29 DIAGNOSIS — I700 Atherosclerosis of aorta: Secondary | ICD-10-CM | POA: Diagnosis not present

## 2021-06-29 DIAGNOSIS — I130 Hypertensive heart and chronic kidney disease with heart failure and stage 1 through stage 4 chronic kidney disease, or unspecified chronic kidney disease: Secondary | ICD-10-CM | POA: Diagnosis not present

## 2021-06-29 DIAGNOSIS — M545 Low back pain, unspecified: Secondary | ICD-10-CM | POA: Diagnosis not present

## 2021-06-29 DIAGNOSIS — N183 Chronic kidney disease, stage 3 unspecified: Secondary | ICD-10-CM | POA: Diagnosis not present

## 2021-06-29 DIAGNOSIS — G309 Alzheimer's disease, unspecified: Secondary | ICD-10-CM | POA: Diagnosis not present

## 2021-06-29 DIAGNOSIS — G4733 Obstructive sleep apnea (adult) (pediatric): Secondary | ICD-10-CM | POA: Diagnosis not present

## 2021-06-29 DIAGNOSIS — E1165 Type 2 diabetes mellitus with hyperglycemia: Secondary | ICD-10-CM | POA: Diagnosis not present

## 2021-06-29 DIAGNOSIS — E1122 Type 2 diabetes mellitus with diabetic chronic kidney disease: Secondary | ICD-10-CM | POA: Diagnosis not present

## 2021-07-01 DIAGNOSIS — E1165 Type 2 diabetes mellitus with hyperglycemia: Secondary | ICD-10-CM | POA: Diagnosis not present

## 2021-07-01 DIAGNOSIS — G309 Alzheimer's disease, unspecified: Secondary | ICD-10-CM | POA: Diagnosis not present

## 2021-07-01 DIAGNOSIS — F32A Depression, unspecified: Secondary | ICD-10-CM | POA: Diagnosis not present

## 2021-07-01 DIAGNOSIS — N183 Chronic kidney disease, stage 3 unspecified: Secondary | ICD-10-CM | POA: Diagnosis not present

## 2021-07-01 DIAGNOSIS — I700 Atherosclerosis of aorta: Secondary | ICD-10-CM | POA: Diagnosis not present

## 2021-07-01 DIAGNOSIS — I130 Hypertensive heart and chronic kidney disease with heart failure and stage 1 through stage 4 chronic kidney disease, or unspecified chronic kidney disease: Secondary | ICD-10-CM | POA: Diagnosis not present

## 2021-07-01 DIAGNOSIS — G4733 Obstructive sleep apnea (adult) (pediatric): Secondary | ICD-10-CM | POA: Diagnosis not present

## 2021-07-01 DIAGNOSIS — M545 Low back pain, unspecified: Secondary | ICD-10-CM | POA: Diagnosis not present

## 2021-07-01 DIAGNOSIS — E1122 Type 2 diabetes mellitus with diabetic chronic kidney disease: Secondary | ICD-10-CM | POA: Diagnosis not present

## 2021-07-02 DIAGNOSIS — Z789 Other specified health status: Secondary | ICD-10-CM | POA: Diagnosis not present

## 2021-07-02 DIAGNOSIS — Z299 Encounter for prophylactic measures, unspecified: Secondary | ICD-10-CM | POA: Diagnosis not present

## 2021-07-02 DIAGNOSIS — E1165 Type 2 diabetes mellitus with hyperglycemia: Secondary | ICD-10-CM | POA: Diagnosis not present

## 2021-07-02 DIAGNOSIS — I10 Essential (primary) hypertension: Secondary | ICD-10-CM | POA: Diagnosis not present

## 2021-07-02 DIAGNOSIS — G309 Alzheimer's disease, unspecified: Secondary | ICD-10-CM | POA: Diagnosis not present

## 2021-07-02 DIAGNOSIS — Z6824 Body mass index (BMI) 24.0-24.9, adult: Secondary | ICD-10-CM | POA: Diagnosis not present

## 2021-07-02 DIAGNOSIS — I509 Heart failure, unspecified: Secondary | ICD-10-CM | POA: Diagnosis not present

## 2021-07-08 ENCOUNTER — Other Ambulatory Visit: Payer: Self-pay | Admitting: Internal Medicine

## 2021-07-08 DIAGNOSIS — F32A Depression, unspecified: Secondary | ICD-10-CM | POA: Diagnosis not present

## 2021-07-08 DIAGNOSIS — G309 Alzheimer's disease, unspecified: Secondary | ICD-10-CM | POA: Diagnosis not present

## 2021-07-08 DIAGNOSIS — M545 Low back pain, unspecified: Secondary | ICD-10-CM | POA: Diagnosis not present

## 2021-07-08 DIAGNOSIS — I700 Atherosclerosis of aorta: Secondary | ICD-10-CM | POA: Diagnosis not present

## 2021-07-08 DIAGNOSIS — I130 Hypertensive heart and chronic kidney disease with heart failure and stage 1 through stage 4 chronic kidney disease, or unspecified chronic kidney disease: Secondary | ICD-10-CM | POA: Diagnosis not present

## 2021-07-08 DIAGNOSIS — E1165 Type 2 diabetes mellitus with hyperglycemia: Secondary | ICD-10-CM | POA: Diagnosis not present

## 2021-07-08 DIAGNOSIS — G4733 Obstructive sleep apnea (adult) (pediatric): Secondary | ICD-10-CM | POA: Diagnosis not present

## 2021-07-08 DIAGNOSIS — N183 Chronic kidney disease, stage 3 unspecified: Secondary | ICD-10-CM | POA: Diagnosis not present

## 2021-07-08 DIAGNOSIS — E1122 Type 2 diabetes mellitus with diabetic chronic kidney disease: Secondary | ICD-10-CM | POA: Diagnosis not present

## 2021-07-12 DIAGNOSIS — G4733 Obstructive sleep apnea (adult) (pediatric): Secondary | ICD-10-CM | POA: Diagnosis not present

## 2021-07-12 DIAGNOSIS — G309 Alzheimer's disease, unspecified: Secondary | ICD-10-CM | POA: Diagnosis not present

## 2021-07-12 DIAGNOSIS — N183 Chronic kidney disease, stage 3 unspecified: Secondary | ICD-10-CM | POA: Diagnosis not present

## 2021-07-12 DIAGNOSIS — I700 Atherosclerosis of aorta: Secondary | ICD-10-CM | POA: Diagnosis not present

## 2021-07-12 DIAGNOSIS — I130 Hypertensive heart and chronic kidney disease with heart failure and stage 1 through stage 4 chronic kidney disease, or unspecified chronic kidney disease: Secondary | ICD-10-CM | POA: Diagnosis not present

## 2021-07-12 DIAGNOSIS — E1122 Type 2 diabetes mellitus with diabetic chronic kidney disease: Secondary | ICD-10-CM | POA: Diagnosis not present

## 2021-07-12 DIAGNOSIS — E1165 Type 2 diabetes mellitus with hyperglycemia: Secondary | ICD-10-CM | POA: Diagnosis not present

## 2021-07-12 DIAGNOSIS — M545 Low back pain, unspecified: Secondary | ICD-10-CM | POA: Diagnosis not present

## 2021-07-12 DIAGNOSIS — F32A Depression, unspecified: Secondary | ICD-10-CM | POA: Diagnosis not present

## 2021-07-13 DIAGNOSIS — I130 Hypertensive heart and chronic kidney disease with heart failure and stage 1 through stage 4 chronic kidney disease, or unspecified chronic kidney disease: Secondary | ICD-10-CM | POA: Diagnosis not present

## 2021-07-13 DIAGNOSIS — G4733 Obstructive sleep apnea (adult) (pediatric): Secondary | ICD-10-CM | POA: Diagnosis not present

## 2021-07-13 DIAGNOSIS — E1165 Type 2 diabetes mellitus with hyperglycemia: Secondary | ICD-10-CM | POA: Diagnosis not present

## 2021-07-13 DIAGNOSIS — N183 Chronic kidney disease, stage 3 unspecified: Secondary | ICD-10-CM | POA: Diagnosis not present

## 2021-07-13 DIAGNOSIS — E1122 Type 2 diabetes mellitus with diabetic chronic kidney disease: Secondary | ICD-10-CM | POA: Diagnosis not present

## 2021-07-13 DIAGNOSIS — G309 Alzheimer's disease, unspecified: Secondary | ICD-10-CM | POA: Diagnosis not present

## 2021-07-13 DIAGNOSIS — I700 Atherosclerosis of aorta: Secondary | ICD-10-CM | POA: Diagnosis not present

## 2021-07-13 DIAGNOSIS — F32A Depression, unspecified: Secondary | ICD-10-CM | POA: Diagnosis not present

## 2021-07-13 DIAGNOSIS — M545 Low back pain, unspecified: Secondary | ICD-10-CM | POA: Diagnosis not present

## 2021-07-15 DIAGNOSIS — K219 Gastro-esophageal reflux disease without esophagitis: Secondary | ICD-10-CM | POA: Diagnosis not present

## 2021-07-15 DIAGNOSIS — E11649 Type 2 diabetes mellitus with hypoglycemia without coma: Secondary | ICD-10-CM | POA: Diagnosis not present

## 2021-07-15 DIAGNOSIS — I7091 Generalized atherosclerosis: Secondary | ICD-10-CM | POA: Diagnosis not present

## 2021-07-15 DIAGNOSIS — M5459 Other low back pain: Secondary | ICD-10-CM | POA: Diagnosis not present

## 2021-07-15 DIAGNOSIS — I5042 Chronic combined systolic (congestive) and diastolic (congestive) heart failure: Secondary | ICD-10-CM | POA: Diagnosis not present

## 2021-07-15 DIAGNOSIS — M109 Gout, unspecified: Secondary | ICD-10-CM | POA: Diagnosis not present

## 2021-07-15 DIAGNOSIS — G301 Alzheimer's disease with late onset: Secondary | ICD-10-CM | POA: Diagnosis not present

## 2021-07-19 DIAGNOSIS — I700 Atherosclerosis of aorta: Secondary | ICD-10-CM | POA: Diagnosis not present

## 2021-07-19 DIAGNOSIS — E1122 Type 2 diabetes mellitus with diabetic chronic kidney disease: Secondary | ICD-10-CM | POA: Diagnosis not present

## 2021-07-19 DIAGNOSIS — I130 Hypertensive heart and chronic kidney disease with heart failure and stage 1 through stage 4 chronic kidney disease, or unspecified chronic kidney disease: Secondary | ICD-10-CM | POA: Diagnosis not present

## 2021-07-19 DIAGNOSIS — M545 Low back pain, unspecified: Secondary | ICD-10-CM | POA: Diagnosis not present

## 2021-07-19 DIAGNOSIS — N183 Chronic kidney disease, stage 3 unspecified: Secondary | ICD-10-CM | POA: Diagnosis not present

## 2021-07-19 DIAGNOSIS — F32A Depression, unspecified: Secondary | ICD-10-CM | POA: Diagnosis not present

## 2021-07-19 DIAGNOSIS — E1165 Type 2 diabetes mellitus with hyperglycemia: Secondary | ICD-10-CM | POA: Diagnosis not present

## 2021-07-19 DIAGNOSIS — G309 Alzheimer's disease, unspecified: Secondary | ICD-10-CM | POA: Diagnosis not present

## 2021-07-19 DIAGNOSIS — G4733 Obstructive sleep apnea (adult) (pediatric): Secondary | ICD-10-CM | POA: Diagnosis not present

## 2021-07-20 DIAGNOSIS — G309 Alzheimer's disease, unspecified: Secondary | ICD-10-CM | POA: Diagnosis not present

## 2021-07-20 DIAGNOSIS — M545 Low back pain, unspecified: Secondary | ICD-10-CM | POA: Diagnosis not present

## 2021-07-20 DIAGNOSIS — N183 Chronic kidney disease, stage 3 unspecified: Secondary | ICD-10-CM | POA: Diagnosis not present

## 2021-07-20 DIAGNOSIS — E1122 Type 2 diabetes mellitus with diabetic chronic kidney disease: Secondary | ICD-10-CM | POA: Diagnosis not present

## 2021-07-20 DIAGNOSIS — I700 Atherosclerosis of aorta: Secondary | ICD-10-CM | POA: Diagnosis not present

## 2021-07-20 DIAGNOSIS — G4733 Obstructive sleep apnea (adult) (pediatric): Secondary | ICD-10-CM | POA: Diagnosis not present

## 2021-07-20 DIAGNOSIS — I130 Hypertensive heart and chronic kidney disease with heart failure and stage 1 through stage 4 chronic kidney disease, or unspecified chronic kidney disease: Secondary | ICD-10-CM | POA: Diagnosis not present

## 2021-07-20 DIAGNOSIS — F32A Depression, unspecified: Secondary | ICD-10-CM | POA: Diagnosis not present

## 2021-07-20 DIAGNOSIS — N390 Urinary tract infection, site not specified: Secondary | ICD-10-CM | POA: Diagnosis not present

## 2021-07-20 DIAGNOSIS — E1165 Type 2 diabetes mellitus with hyperglycemia: Secondary | ICD-10-CM | POA: Diagnosis not present

## 2021-07-21 DIAGNOSIS — N183 Chronic kidney disease, stage 3 unspecified: Secondary | ICD-10-CM | POA: Diagnosis not present

## 2021-07-21 DIAGNOSIS — E1165 Type 2 diabetes mellitus with hyperglycemia: Secondary | ICD-10-CM | POA: Diagnosis not present

## 2021-07-21 DIAGNOSIS — G309 Alzheimer's disease, unspecified: Secondary | ICD-10-CM | POA: Diagnosis not present

## 2021-07-21 DIAGNOSIS — F32A Depression, unspecified: Secondary | ICD-10-CM | POA: Diagnosis not present

## 2021-07-21 DIAGNOSIS — E1122 Type 2 diabetes mellitus with diabetic chronic kidney disease: Secondary | ICD-10-CM | POA: Diagnosis not present

## 2021-07-21 DIAGNOSIS — G4733 Obstructive sleep apnea (adult) (pediatric): Secondary | ICD-10-CM | POA: Diagnosis not present

## 2021-07-21 DIAGNOSIS — M545 Low back pain, unspecified: Secondary | ICD-10-CM | POA: Diagnosis not present

## 2021-07-21 DIAGNOSIS — I130 Hypertensive heart and chronic kidney disease with heart failure and stage 1 through stage 4 chronic kidney disease, or unspecified chronic kidney disease: Secondary | ICD-10-CM | POA: Diagnosis not present

## 2021-07-21 DIAGNOSIS — I700 Atherosclerosis of aorta: Secondary | ICD-10-CM | POA: Diagnosis not present

## 2021-07-22 DIAGNOSIS — F32A Depression, unspecified: Secondary | ICD-10-CM | POA: Diagnosis not present

## 2021-07-22 DIAGNOSIS — G309 Alzheimer's disease, unspecified: Secondary | ICD-10-CM | POA: Diagnosis not present

## 2021-07-22 DIAGNOSIS — M545 Low back pain, unspecified: Secondary | ICD-10-CM | POA: Diagnosis not present

## 2021-07-23 DIAGNOSIS — Z79899 Other long term (current) drug therapy: Secondary | ICD-10-CM | POA: Diagnosis not present

## 2021-07-27 DIAGNOSIS — M545 Low back pain, unspecified: Secondary | ICD-10-CM | POA: Diagnosis not present

## 2021-07-27 DIAGNOSIS — N183 Chronic kidney disease, stage 3 unspecified: Secondary | ICD-10-CM | POA: Diagnosis not present

## 2021-07-27 DIAGNOSIS — E1122 Type 2 diabetes mellitus with diabetic chronic kidney disease: Secondary | ICD-10-CM | POA: Diagnosis not present

## 2021-07-27 DIAGNOSIS — E1165 Type 2 diabetes mellitus with hyperglycemia: Secondary | ICD-10-CM | POA: Diagnosis not present

## 2021-07-27 DIAGNOSIS — G309 Alzheimer's disease, unspecified: Secondary | ICD-10-CM | POA: Diagnosis not present

## 2021-07-27 DIAGNOSIS — I700 Atherosclerosis of aorta: Secondary | ICD-10-CM | POA: Diagnosis not present

## 2021-07-27 DIAGNOSIS — G4733 Obstructive sleep apnea (adult) (pediatric): Secondary | ICD-10-CM | POA: Diagnosis not present

## 2021-07-27 DIAGNOSIS — I130 Hypertensive heart and chronic kidney disease with heart failure and stage 1 through stage 4 chronic kidney disease, or unspecified chronic kidney disease: Secondary | ICD-10-CM | POA: Diagnosis not present

## 2021-07-27 DIAGNOSIS — F32A Depression, unspecified: Secondary | ICD-10-CM | POA: Diagnosis not present

## 2021-07-28 DIAGNOSIS — G309 Alzheimer's disease, unspecified: Secondary | ICD-10-CM | POA: Diagnosis not present

## 2021-07-28 DIAGNOSIS — M545 Low back pain, unspecified: Secondary | ICD-10-CM | POA: Diagnosis not present

## 2021-07-28 DIAGNOSIS — F32A Depression, unspecified: Secondary | ICD-10-CM | POA: Diagnosis not present

## 2021-07-28 DIAGNOSIS — I700 Atherosclerosis of aorta: Secondary | ICD-10-CM | POA: Diagnosis not present

## 2021-07-28 DIAGNOSIS — N183 Chronic kidney disease, stage 3 unspecified: Secondary | ICD-10-CM | POA: Diagnosis not present

## 2021-07-28 DIAGNOSIS — E1122 Type 2 diabetes mellitus with diabetic chronic kidney disease: Secondary | ICD-10-CM | POA: Diagnosis not present

## 2021-07-28 DIAGNOSIS — I130 Hypertensive heart and chronic kidney disease with heart failure and stage 1 through stage 4 chronic kidney disease, or unspecified chronic kidney disease: Secondary | ICD-10-CM | POA: Diagnosis not present

## 2021-07-28 DIAGNOSIS — G4733 Obstructive sleep apnea (adult) (pediatric): Secondary | ICD-10-CM | POA: Diagnosis not present

## 2021-07-28 DIAGNOSIS — E1165 Type 2 diabetes mellitus with hyperglycemia: Secondary | ICD-10-CM | POA: Diagnosis not present

## 2021-07-29 DIAGNOSIS — I130 Hypertensive heart and chronic kidney disease with heart failure and stage 1 through stage 4 chronic kidney disease, or unspecified chronic kidney disease: Secondary | ICD-10-CM | POA: Diagnosis not present

## 2021-07-29 DIAGNOSIS — G309 Alzheimer's disease, unspecified: Secondary | ICD-10-CM | POA: Diagnosis not present

## 2021-07-29 DIAGNOSIS — N183 Chronic kidney disease, stage 3 unspecified: Secondary | ICD-10-CM | POA: Diagnosis not present

## 2021-07-29 DIAGNOSIS — I700 Atherosclerosis of aorta: Secondary | ICD-10-CM | POA: Diagnosis not present

## 2021-07-29 DIAGNOSIS — M545 Low back pain, unspecified: Secondary | ICD-10-CM | POA: Diagnosis not present

## 2021-07-29 DIAGNOSIS — E1165 Type 2 diabetes mellitus with hyperglycemia: Secondary | ICD-10-CM | POA: Diagnosis not present

## 2021-07-29 DIAGNOSIS — G4733 Obstructive sleep apnea (adult) (pediatric): Secondary | ICD-10-CM | POA: Diagnosis not present

## 2021-07-29 DIAGNOSIS — F32A Depression, unspecified: Secondary | ICD-10-CM | POA: Diagnosis not present

## 2021-07-29 DIAGNOSIS — E1122 Type 2 diabetes mellitus with diabetic chronic kidney disease: Secondary | ICD-10-CM | POA: Diagnosis not present

## 2021-08-02 DIAGNOSIS — N183 Chronic kidney disease, stage 3 unspecified: Secondary | ICD-10-CM | POA: Diagnosis not present

## 2021-08-02 DIAGNOSIS — E1165 Type 2 diabetes mellitus with hyperglycemia: Secondary | ICD-10-CM | POA: Diagnosis not present

## 2021-08-02 DIAGNOSIS — I700 Atherosclerosis of aorta: Secondary | ICD-10-CM | POA: Diagnosis not present

## 2021-08-02 DIAGNOSIS — G4733 Obstructive sleep apnea (adult) (pediatric): Secondary | ICD-10-CM | POA: Diagnosis not present

## 2021-08-02 DIAGNOSIS — M545 Low back pain, unspecified: Secondary | ICD-10-CM | POA: Diagnosis not present

## 2021-08-02 DIAGNOSIS — E1122 Type 2 diabetes mellitus with diabetic chronic kidney disease: Secondary | ICD-10-CM | POA: Diagnosis not present

## 2021-08-02 DIAGNOSIS — I130 Hypertensive heart and chronic kidney disease with heart failure and stage 1 through stage 4 chronic kidney disease, or unspecified chronic kidney disease: Secondary | ICD-10-CM | POA: Diagnosis not present

## 2021-08-02 DIAGNOSIS — G309 Alzheimer's disease, unspecified: Secondary | ICD-10-CM | POA: Diagnosis not present

## 2021-08-02 DIAGNOSIS — F32A Depression, unspecified: Secondary | ICD-10-CM | POA: Diagnosis not present

## 2021-08-03 DIAGNOSIS — I130 Hypertensive heart and chronic kidney disease with heart failure and stage 1 through stage 4 chronic kidney disease, or unspecified chronic kidney disease: Secondary | ICD-10-CM | POA: Diagnosis not present

## 2021-08-03 DIAGNOSIS — E1122 Type 2 diabetes mellitus with diabetic chronic kidney disease: Secondary | ICD-10-CM | POA: Diagnosis not present

## 2021-08-03 DIAGNOSIS — E1165 Type 2 diabetes mellitus with hyperglycemia: Secondary | ICD-10-CM | POA: Diagnosis not present

## 2021-08-03 DIAGNOSIS — N183 Chronic kidney disease, stage 3 unspecified: Secondary | ICD-10-CM | POA: Diagnosis not present

## 2021-08-03 DIAGNOSIS — I700 Atherosclerosis of aorta: Secondary | ICD-10-CM | POA: Diagnosis not present

## 2021-08-03 DIAGNOSIS — M545 Low back pain, unspecified: Secondary | ICD-10-CM | POA: Diagnosis not present

## 2021-08-03 DIAGNOSIS — F02B0 Dementia in other diseases classified elsewhere, moderate, without behavioral disturbance, psychotic disturbance, mood disturbance, and anxiety: Secondary | ICD-10-CM | POA: Diagnosis not present

## 2021-08-03 DIAGNOSIS — G309 Alzheimer's disease, unspecified: Secondary | ICD-10-CM | POA: Diagnosis not present

## 2021-08-03 DIAGNOSIS — G301 Alzheimer's disease with late onset: Secondary | ICD-10-CM | POA: Diagnosis not present

## 2021-08-03 DIAGNOSIS — G4733 Obstructive sleep apnea (adult) (pediatric): Secondary | ICD-10-CM | POA: Diagnosis not present

## 2021-08-03 DIAGNOSIS — F32A Depression, unspecified: Secondary | ICD-10-CM | POA: Diagnosis not present

## 2021-08-04 DIAGNOSIS — I130 Hypertensive heart and chronic kidney disease with heart failure and stage 1 through stage 4 chronic kidney disease, or unspecified chronic kidney disease: Secondary | ICD-10-CM | POA: Diagnosis not present

## 2021-08-04 DIAGNOSIS — F32A Depression, unspecified: Secondary | ICD-10-CM | POA: Diagnosis not present

## 2021-08-04 DIAGNOSIS — I700 Atherosclerosis of aorta: Secondary | ICD-10-CM | POA: Diagnosis not present

## 2021-08-04 DIAGNOSIS — G309 Alzheimer's disease, unspecified: Secondary | ICD-10-CM | POA: Diagnosis not present

## 2021-08-04 DIAGNOSIS — M545 Low back pain, unspecified: Secondary | ICD-10-CM | POA: Diagnosis not present

## 2021-08-04 DIAGNOSIS — E1122 Type 2 diabetes mellitus with diabetic chronic kidney disease: Secondary | ICD-10-CM | POA: Diagnosis not present

## 2021-08-04 DIAGNOSIS — G4733 Obstructive sleep apnea (adult) (pediatric): Secondary | ICD-10-CM | POA: Diagnosis not present

## 2021-08-04 DIAGNOSIS — N183 Chronic kidney disease, stage 3 unspecified: Secondary | ICD-10-CM | POA: Diagnosis not present

## 2021-08-04 DIAGNOSIS — E1165 Type 2 diabetes mellitus with hyperglycemia: Secondary | ICD-10-CM | POA: Diagnosis not present

## 2021-08-10 DIAGNOSIS — E1165 Type 2 diabetes mellitus with hyperglycemia: Secondary | ICD-10-CM | POA: Diagnosis not present

## 2021-08-10 DIAGNOSIS — I130 Hypertensive heart and chronic kidney disease with heart failure and stage 1 through stage 4 chronic kidney disease, or unspecified chronic kidney disease: Secondary | ICD-10-CM | POA: Diagnosis not present

## 2021-08-10 DIAGNOSIS — N183 Chronic kidney disease, stage 3 unspecified: Secondary | ICD-10-CM | POA: Diagnosis not present

## 2021-08-10 DIAGNOSIS — E1122 Type 2 diabetes mellitus with diabetic chronic kidney disease: Secondary | ICD-10-CM | POA: Diagnosis not present

## 2021-08-10 DIAGNOSIS — G4733 Obstructive sleep apnea (adult) (pediatric): Secondary | ICD-10-CM | POA: Diagnosis not present

## 2021-08-10 DIAGNOSIS — F32A Depression, unspecified: Secondary | ICD-10-CM | POA: Diagnosis not present

## 2021-08-10 DIAGNOSIS — I700 Atherosclerosis of aorta: Secondary | ICD-10-CM | POA: Diagnosis not present

## 2021-08-10 DIAGNOSIS — M545 Low back pain, unspecified: Secondary | ICD-10-CM | POA: Diagnosis not present

## 2021-08-10 DIAGNOSIS — G309 Alzheimer's disease, unspecified: Secondary | ICD-10-CM | POA: Diagnosis not present

## 2021-08-12 DIAGNOSIS — M545 Low back pain, unspecified: Secondary | ICD-10-CM | POA: Diagnosis not present

## 2021-08-12 DIAGNOSIS — I130 Hypertensive heart and chronic kidney disease with heart failure and stage 1 through stage 4 chronic kidney disease, or unspecified chronic kidney disease: Secondary | ICD-10-CM | POA: Diagnosis not present

## 2021-08-12 DIAGNOSIS — E1122 Type 2 diabetes mellitus with diabetic chronic kidney disease: Secondary | ICD-10-CM | POA: Diagnosis not present

## 2021-08-12 DIAGNOSIS — M5459 Other low back pain: Secondary | ICD-10-CM | POA: Diagnosis not present

## 2021-08-12 DIAGNOSIS — G309 Alzheimer's disease, unspecified: Secondary | ICD-10-CM | POA: Diagnosis not present

## 2021-08-12 DIAGNOSIS — E1165 Type 2 diabetes mellitus with hyperglycemia: Secondary | ICD-10-CM | POA: Diagnosis not present

## 2021-08-12 DIAGNOSIS — I700 Atherosclerosis of aorta: Secondary | ICD-10-CM | POA: Diagnosis not present

## 2021-08-12 DIAGNOSIS — G4733 Obstructive sleep apnea (adult) (pediatric): Secondary | ICD-10-CM | POA: Diagnosis not present

## 2021-08-12 DIAGNOSIS — N183 Chronic kidney disease, stage 3 unspecified: Secondary | ICD-10-CM | POA: Diagnosis not present

## 2021-08-12 DIAGNOSIS — F32A Depression, unspecified: Secondary | ICD-10-CM | POA: Diagnosis not present

## 2021-08-19 DIAGNOSIS — N183 Chronic kidney disease, stage 3 unspecified: Secondary | ICD-10-CM | POA: Diagnosis not present

## 2021-08-19 DIAGNOSIS — E1122 Type 2 diabetes mellitus with diabetic chronic kidney disease: Secondary | ICD-10-CM | POA: Diagnosis not present

## 2021-08-19 DIAGNOSIS — M545 Low back pain, unspecified: Secondary | ICD-10-CM | POA: Diagnosis not present

## 2021-08-19 DIAGNOSIS — F32A Depression, unspecified: Secondary | ICD-10-CM | POA: Diagnosis not present

## 2021-08-19 DIAGNOSIS — G309 Alzheimer's disease, unspecified: Secondary | ICD-10-CM | POA: Diagnosis not present

## 2021-08-19 DIAGNOSIS — I700 Atherosclerosis of aorta: Secondary | ICD-10-CM | POA: Diagnosis not present

## 2021-08-19 DIAGNOSIS — I130 Hypertensive heart and chronic kidney disease with heart failure and stage 1 through stage 4 chronic kidney disease, or unspecified chronic kidney disease: Secondary | ICD-10-CM | POA: Diagnosis not present

## 2021-08-19 DIAGNOSIS — E1165 Type 2 diabetes mellitus with hyperglycemia: Secondary | ICD-10-CM | POA: Diagnosis not present

## 2021-08-19 DIAGNOSIS — G4733 Obstructive sleep apnea (adult) (pediatric): Secondary | ICD-10-CM | POA: Diagnosis not present

## 2021-08-24 DIAGNOSIS — N183 Chronic kidney disease, stage 3 unspecified: Secondary | ICD-10-CM | POA: Diagnosis not present

## 2021-08-24 DIAGNOSIS — G309 Alzheimer's disease, unspecified: Secondary | ICD-10-CM | POA: Diagnosis not present

## 2021-08-24 DIAGNOSIS — M545 Low back pain, unspecified: Secondary | ICD-10-CM | POA: Diagnosis not present

## 2021-08-24 DIAGNOSIS — G4733 Obstructive sleep apnea (adult) (pediatric): Secondary | ICD-10-CM | POA: Diagnosis not present

## 2021-08-24 DIAGNOSIS — E1165 Type 2 diabetes mellitus with hyperglycemia: Secondary | ICD-10-CM | POA: Diagnosis not present

## 2021-08-24 DIAGNOSIS — I130 Hypertensive heart and chronic kidney disease with heart failure and stage 1 through stage 4 chronic kidney disease, or unspecified chronic kidney disease: Secondary | ICD-10-CM | POA: Diagnosis not present

## 2021-08-24 DIAGNOSIS — F32A Depression, unspecified: Secondary | ICD-10-CM | POA: Diagnosis not present

## 2021-08-24 DIAGNOSIS — E1122 Type 2 diabetes mellitus with diabetic chronic kidney disease: Secondary | ICD-10-CM | POA: Diagnosis not present

## 2021-08-24 DIAGNOSIS — I700 Atherosclerosis of aorta: Secondary | ICD-10-CM | POA: Diagnosis not present

## 2021-08-30 DIAGNOSIS — F02B0 Dementia in other diseases classified elsewhere, moderate, without behavioral disturbance, psychotic disturbance, mood disturbance, and anxiety: Secondary | ICD-10-CM | POA: Diagnosis not present

## 2021-08-30 DIAGNOSIS — G301 Alzheimer's disease with late onset: Secondary | ICD-10-CM | POA: Diagnosis not present

## 2021-09-03 ENCOUNTER — Other Ambulatory Visit: Payer: Self-pay

## 2021-09-03 ENCOUNTER — Emergency Department (HOSPITAL_COMMUNITY)
Admission: EM | Admit: 2021-09-03 | Discharge: 2021-09-03 | Disposition: A | Discharge status: Skilled Nursing Facility | Payer: Medicare Other | Attending: Emergency Medicine | Admitting: Emergency Medicine

## 2021-09-03 ENCOUNTER — Emergency Department (HOSPITAL_COMMUNITY): Payer: Medicare Other

## 2021-09-03 ENCOUNTER — Encounter (HOSPITAL_COMMUNITY): Payer: Self-pay

## 2021-09-03 DIAGNOSIS — E119 Type 2 diabetes mellitus without complications: Secondary | ICD-10-CM | POA: Diagnosis not present

## 2021-09-03 DIAGNOSIS — Z79899 Other long term (current) drug therapy: Secondary | ICD-10-CM | POA: Diagnosis not present

## 2021-09-03 DIAGNOSIS — Z7984 Long term (current) use of oral hypoglycemic drugs: Secondary | ICD-10-CM | POA: Diagnosis not present

## 2021-09-03 DIAGNOSIS — I2510 Atherosclerotic heart disease of native coronary artery without angina pectoris: Secondary | ICD-10-CM | POA: Insufficient documentation

## 2021-09-03 DIAGNOSIS — R0689 Other abnormalities of breathing: Secondary | ICD-10-CM | POA: Diagnosis not present

## 2021-09-03 DIAGNOSIS — Z7901 Long term (current) use of anticoagulants: Secondary | ICD-10-CM | POA: Diagnosis not present

## 2021-09-03 DIAGNOSIS — R6889 Other general symptoms and signs: Secondary | ICD-10-CM | POA: Diagnosis not present

## 2021-09-03 DIAGNOSIS — I10 Essential (primary) hypertension: Secondary | ICD-10-CM | POA: Insufficient documentation

## 2021-09-03 DIAGNOSIS — Z794 Long term (current) use of insulin: Secondary | ICD-10-CM | POA: Insufficient documentation

## 2021-09-03 DIAGNOSIS — Z743 Need for continuous supervision: Secondary | ICD-10-CM | POA: Diagnosis not present

## 2021-09-03 DIAGNOSIS — R072 Precordial pain: Secondary | ICD-10-CM | POA: Insufficient documentation

## 2021-09-03 DIAGNOSIS — R079 Chest pain, unspecified: Secondary | ICD-10-CM | POA: Diagnosis not present

## 2021-09-03 LAB — CBC
HCT: 33.2 % — ABNORMAL LOW (ref 39.0–52.0)
Hemoglobin: 11.1 g/dL — ABNORMAL LOW (ref 13.0–17.0)
MCH: 35.1 pg — ABNORMAL HIGH (ref 26.0–34.0)
MCHC: 33.4 g/dL (ref 30.0–36.0)
MCV: 105.1 fL — ABNORMAL HIGH (ref 80.0–100.0)
Platelets: 109 10*3/uL — ABNORMAL LOW (ref 150–400)
RBC: 3.16 MIL/uL — ABNORMAL LOW (ref 4.22–5.81)
RDW: 13.4 % (ref 11.5–15.5)
WBC: 3.7 10*3/uL — ABNORMAL LOW (ref 4.0–10.5)
nRBC: 0 % (ref 0.0–0.2)

## 2021-09-03 LAB — BASIC METABOLIC PANEL
Anion gap: 7 (ref 5–15)
BUN: 31 mg/dL — ABNORMAL HIGH (ref 8–23)
CO2: 23 mmol/L (ref 22–32)
Calcium: 8.8 mg/dL — ABNORMAL LOW (ref 8.9–10.3)
Chloride: 107 mmol/L (ref 98–111)
Creatinine, Ser: 1.31 mg/dL — ABNORMAL HIGH (ref 0.61–1.24)
GFR, Estimated: 56 mL/min — ABNORMAL LOW (ref >60)
Glucose, Bld: 159 mg/dL — ABNORMAL HIGH (ref 70–99)
Potassium: 4.7 mmol/L (ref 3.5–5.1)
Sodium: 137 mmol/L (ref 135–145)

## 2021-09-03 LAB — PROTIME-INR
INR: 1.4 — ABNORMAL HIGH (ref 0.8–1.2)
Prothrombin Time: 16.7 seconds — ABNORMAL HIGH (ref 11.4–15.2)

## 2021-09-03 LAB — TROPONIN I (HIGH SENSITIVITY)
Troponin I (High Sensitivity): 5 ng/L (ref <18)
Troponin I (High Sensitivity): 5 ng/L (ref <18)

## 2021-09-03 LAB — CBG MONITORING, ED: Glucose-Capillary: 168 mg/dL — ABNORMAL HIGH (ref 70–99)

## 2021-09-03 MED ORDER — OMEPRAZOLE 20 MG PO CPDR: 20.0000 mg | DELAYED_RELEASE_CAPSULE | Freq: Two times a day (BID) | ORAL | 0 refills | Status: DC

## 2021-09-03 MED ORDER — SUCRALFATE 1 G PO TABS: 1.0000 g | ORAL_TABLET | Freq: Three times a day (TID) | ORAL | 0 refills | Status: DC

## 2021-09-03 NOTE — ED Triage Notes (Signed)
Pt c/o chest pain since last night. No n/v noted. REMS gave 324 ASA, 0.4 Nitro and noted pt has a ventricular pacemaker. 20 G IV RFA.

## 2021-09-07 DIAGNOSIS — L84 Corns and callosities: Secondary | ICD-10-CM | POA: Diagnosis not present

## 2021-09-07 DIAGNOSIS — E1051 Type 1 diabetes mellitus with diabetic peripheral angiopathy without gangrene: Secondary | ICD-10-CM | POA: Diagnosis not present

## 2021-09-08 DIAGNOSIS — R079 Chest pain, unspecified: Secondary | ICD-10-CM | POA: Diagnosis not present

## 2021-09-08 DIAGNOSIS — K259 Gastric ulcer, unspecified as acute or chronic, without hemorrhage or perforation: Secondary | ICD-10-CM | POA: Diagnosis not present

## 2021-09-26 DIAGNOSIS — F02B0 Dementia in other diseases classified elsewhere, moderate, without behavioral disturbance, psychotic disturbance, mood disturbance, and anxiety: Secondary | ICD-10-CM | POA: Diagnosis not present

## 2021-09-26 DIAGNOSIS — G301 Alzheimer's disease with late onset: Secondary | ICD-10-CM | POA: Diagnosis not present

## 2021-10-01 DIAGNOSIS — E119 Type 2 diabetes mellitus without complications: Secondary | ICD-10-CM | POA: Diagnosis not present

## 2021-10-01 DIAGNOSIS — M940 Chondrocostal junction syndrome [Tietze]: Secondary | ICD-10-CM | POA: Diagnosis not present

## 2021-10-01 DIAGNOSIS — G301 Alzheimer's disease with late onset: Secondary | ICD-10-CM | POA: Diagnosis not present

## 2021-10-01 DIAGNOSIS — Z7901 Long term (current) use of anticoagulants: Secondary | ICD-10-CM | POA: Diagnosis not present

## 2021-10-04 ENCOUNTER — Encounter (HOSPITAL_COMMUNITY): Payer: Self-pay | Admitting: Emergency Medicine

## 2021-10-04 ENCOUNTER — Emergency Department (HOSPITAL_COMMUNITY)
Admission: EM | Admit: 2021-10-04 | Discharge: 2021-10-04 | Disposition: A | Discharge status: Home / Self Care | Payer: Medicare Other | Attending: Emergency Medicine | Admitting: Emergency Medicine

## 2021-10-04 ENCOUNTER — Emergency Department (HOSPITAL_COMMUNITY): Payer: Medicare Other

## 2021-10-04 DIAGNOSIS — W1830XA Fall on same level, unspecified, initial encounter: Secondary | ICD-10-CM | POA: Diagnosis not present

## 2021-10-04 DIAGNOSIS — Z043 Encounter for examination and observation following other accident: Secondary | ICD-10-CM | POA: Diagnosis not present

## 2021-10-04 DIAGNOSIS — S0181XA Laceration without foreign body of other part of head, initial encounter: Secondary | ICD-10-CM | POA: Insufficient documentation

## 2021-10-04 DIAGNOSIS — S01111A Laceration without foreign body of right eyelid and periocular area, initial encounter: Secondary | ICD-10-CM | POA: Diagnosis not present

## 2021-10-04 DIAGNOSIS — W19XXXA Unspecified fall, initial encounter: Secondary | ICD-10-CM | POA: Diagnosis not present

## 2021-10-04 DIAGNOSIS — I10 Essential (primary) hypertension: Secondary | ICD-10-CM | POA: Diagnosis not present

## 2021-10-04 DIAGNOSIS — S0990XA Unspecified injury of head, initial encounter: Secondary | ICD-10-CM | POA: Diagnosis not present

## 2021-10-04 DIAGNOSIS — E119 Type 2 diabetes mellitus without complications: Secondary | ICD-10-CM | POA: Diagnosis not present

## 2021-10-04 DIAGNOSIS — Z743 Need for continuous supervision: Secondary | ICD-10-CM | POA: Diagnosis not present

## 2021-10-04 DIAGNOSIS — Z7901 Long term (current) use of anticoagulants: Secondary | ICD-10-CM | POA: Insufficient documentation

## 2021-10-04 DIAGNOSIS — I2510 Atherosclerotic heart disease of native coronary artery without angina pectoris: Secondary | ICD-10-CM | POA: Insufficient documentation

## 2021-10-04 DIAGNOSIS — F0390 Unspecified dementia without behavioral disturbance: Secondary | ICD-10-CM | POA: Insufficient documentation

## 2021-10-04 DIAGNOSIS — R6889 Other general symptoms and signs: Secondary | ICD-10-CM | POA: Diagnosis not present

## 2021-10-04 NOTE — Discharge Instructions (Addendum)
You were evaluated in the Emergency Department and after careful evaluation, we did not find any emergent condition requiring admission or further testing in the hospital.  Your exam/testing today is overall reassuring.  CT head did not show any significant traumatic injuries.  Please return to the Emergency Department if you experience any worsening of your condition.   Thank you for allowing [place] to be a part of your care.

## 2021-10-04 NOTE — ED Notes (Signed)
[facility] called to transfer pt back to nursing facility [facility]). Nurse notified

## 2021-10-04 NOTE — ED Triage Notes (Signed)
Pt had unwitnessed mechanical fall this morning at [facility]. Pt has hx of dementia. Pt has small lac to right eyebrow, c/o headache.   Pt takes eliquis daily.

## 2021-10-04 NOTE — ED Provider Notes (Addendum)
[facility] Emergency Department Provider Note MRN:  [id number]  Arrival date & time: 10/04/21     Chief Complaint   Fall   History of Present Illness   Jay Campbell is a 76 y.o. year-old male with a history of A-fib, dementia presenting to the ED with chief complaint of fall.  Unwitnessed fall going from care facility, small laceration to the right brow.  History of dementia.  Review of Systems  I was unable to obtain a full/accurate HPI, PMH, or ROS due to the patient's dementia.  Patient's Health History    Past Medical History:  Diagnosis Date   Alzheimer's dementia ([place])    Anxiety    Atrial fibrillation ([place])    Permanent   Coronary artery disease    Mild nonobstructive 1/12   DM (diabetes mellitus) ([facility])    GERD (gastroesophageal reflux disease)    Gout    Hearing disorder, cochlear    Hiatal hernia    HLD (hyperlipidemia)    HTN (hypertension)    S/P AV nodal ablation    s/p SJM PPM; gen change 02-01-13 by Dr Rayann Heman   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   Warfarin anticoagulation     Past Surgical History:  Procedure Laterality Date   CATARACT EXTRACTION Right    COCHLEAR IMPLANT Right    HEMORRHOID BANDING     KNEE ARTHROSCOPY Right 1992   Dr Luna Glasgow    PACEMAKER GENERATOR CHANGE Bilateral 02/01/2013   Procedure: PACEMAKER GENERATOR CHANGE;  Surgeon: Coralyn Mark, MD;  Location: [facility] CATH LAB;  Service: Cardiovascular;  Laterality: Bilateral;   PACEMAKER PLACEMENT  2002; 2014   SJM implanted by Dr Lovena Le with AV nodal ablation performed; gen change to [employer]ent SR RF by Dr Rayann Heman 02-01-13   RETINAL LASER PROCEDURE Left    SLT LASER APPLICATION Left 32/11/5186   Procedure: SLT LASER APPLICATION;  Surgeon: Williams Che, MD;  Location: [facility];  Service: Ophthalmology;  Laterality: Left;    Family History  Problem Relation Age of Onset   COPD Mother    Emphysema Mother    Cancer - Lung Father    Heart disease Paternal  Uncle    Stomach cancer Neg Hx    Colon cancer Neg Hx     Social History   Socioeconomic History   Marital status: Married    Spouse name: Not on file   Number of children: Not on file   Years of education: 11   Highest education level: Not on file  Occupational History   Not on file  Tobacco Use   Smoking status: Never   Smokeless tobacco: Never  Vaping Use   Vaping Use: Never used  Substance and Sexual Activity   Alcohol use: No    Alcohol/week: 0.0 standard drinks of alcohol   Drug use: No   Sexual activity: Not on file  Other Topics Concern   Not on file  Social History Narrative   Not on file   Social Determinants of Health   Financial Resource Strain: Not on file  Food Insecurity: Not on file  Transportation Needs: Not on file  Physical Activity: Not on file  Stress: Not on file  Social Connections: Not on file  Intimate Partner Violence: Not on file     Physical Exam   Vitals:   10/04/21 0638  BP: (!) 166/73  Pulse: 60  Resp: 16  Temp: 97.7 F (36.5 C)  SpO2: 100%    CONSTITUTIONAL:  Well-appearing, NAD NEURO/PSYCH:  Alert, awake, oriented to name, moves all extremities equally EYES:  eyes equal and reactive ENT/NECK:  no LAD, no JVD CARDIO: Regular rate, well-perfused, normal S1 and S2 PULM:  CTAB no wheezing or rhonchi GI/GU:  non-distended, non-tender MSK/SPINE:  No gross deformities, no edema SKIN:  no rash, small laceration to the right brow   *Additional and/or pertinent findings included in MDM below  Diagnostic and Interventional Summary    EKG Interpretation  Date/Time:    Ventricular Rate:    PR Interval:    QRS Duration:   QT Interval:    QTC Calculation:   R Axis:     Text Interpretation:         Labs Reviewed - No data to display  CT Head Wo Contrast  Final Result      Medications - No data to display   Procedures  /  Critical Care .Marland KitchenLaceration Repair  Date/Time: 10/04/2021 6:47 AM  Performed by: Maudie Flakes, MD Authorized by: Maudie Flakes, MD   Consent:    Consent obtained:  Verbal   Consent given by:  Patient   Risks, benefits, and alternatives were discussed: yes     Risks discussed:  Infection, need for additional repair, nerve damage, poor wound healing, poor cosmetic result, pain, retained foreign body, tendon damage and vascular damage Universal protocol:    Procedure explained and questions answered to patient or proxy's satisfaction: yes     Immediately prior to procedure, a time out was called: yes     Patient identity confirmed:  Verbally with patient Anesthesia:    Anesthesia method:  None Laceration details:    Location:  Face   Face location:  R eyebrow   Length (cm):  1   Depth (mm):  2 Pre-procedure details:    Preparation:  Patient was prepped and draped in usual sterile fashion Exploration:    Limited defect created (wound extended): no     Hemostasis achieved with:  Direct pressure   Wound exploration: wound explored through full range of motion and entire depth of wound visualized     Contaminated: no   Treatment:    Area cleansed with:  Saline   Amount of cleaning:  Standard Skin repair:    Repair method:  Steri-Strips and tissue adhesive   Number of Steri-Strips:  2 Approximation:    Approximation:  Close Repair type:    Repair type:  Simple Post-procedure details:    Dressing:  Open (no dressing)   Procedure completion:  Tolerated well, no immediate complications   ED Course and Medical Decision Making  Initial Impression and Ddx Presumed mechanical fall, minimal complaints, only evidence of trauma is the laceration to the right brow, repaired as described above.  On anticoagulation, will obtain CT imaging to exclude intracranial bleeding.  Past medical/surgical history that increases complexity of ED encounter: Anticoagulated, history of A-fib  Interpretation of Diagnostics CT head without acute traumatic injury.  Patient Reassessment  and Ultimate Disposition/Management     Discharge home.  Patient management required discussion with the following services or consulting groups:  None  Complexity of Problems Addressed Acute illness or injury that poses threat of life of bodily function  Additional Data Reviewed and Analyzed Further history obtained from: EMS on arrival  Additional Factors Impacting ED Encounter Risk Minor Procedures  Barth Kirks. [place] Small, [place] [email or web address]  Final Clinical Impressions(s) / ED Diagnoses  ICD-10-CM   1. Laceration of brow without complication, initial encounter  S01.81XA       ED Discharge Orders     None        Discharge Instructions Discussed with and Provided to Patient:     Discharge Instructions      You were evaluated in the Emergency Department and after careful evaluation, we did not find any emergent condition requiring admission or further testing in the hospital.  Your exam/testing today is overall reassuring.  CT head did not show any significant traumatic injuries.  Please return to the Emergency Department if you experience any worsening of your condition.   Thank you for allowing [place] to be a part of your care.       Maudie Flakes, MD 10/04/21 8841    Maudie Flakes, MD 10/04/21 [phone]

## 2021-10-05 DIAGNOSIS — R296 Repeated falls: Secondary | ICD-10-CM | POA: Diagnosis not present

## 2021-10-15 ENCOUNTER — Ambulatory Visit (INDEPENDENT_AMBULATORY_CARE_PROVIDER_SITE_OTHER): Payer: Medicare Other

## 2021-10-15 DIAGNOSIS — I442 Atrioventricular block, complete: Secondary | ICD-10-CM | POA: Diagnosis not present

## 2021-10-15 LAB — CUP PACEART REMOTE DEVICE CHECK
Battery Remaining Longevity: 47 mo
Battery Remaining Percentage: 34 %
Battery Voltage: 2.96 V
Brady Statistic RV Percent Paced: 99 %
Date Time Interrogation Session: 20230804040013
Implantable Lead Implant Date: 20020215
Implantable Lead Location: 753860
Implantable Pulse Generator Implant Date: 20141121
Lead Channel Impedance Value: 580 Ohm
Lead Channel Pacing Threshold Amplitude: 1.5 V
Lead Channel Pacing Threshold Pulse Width: 0.4 ms
Lead Channel Sensing Intrinsic Amplitude: 12 mV
Lead Channel Setting Pacing Amplitude: 1.75 V
Lead Channel Setting Pacing Pulse Width: 0.4 ms
Lead Channel Setting Sensing Sensitivity: 6 mV
Pulse Gen Model: 1240
Pulse Gen Serial Number: 7488472

## 2021-10-26 DIAGNOSIS — G301 Alzheimer's disease with late onset: Secondary | ICD-10-CM | POA: Diagnosis not present

## 2021-11-02 ENCOUNTER — Other Ambulatory Visit: Payer: Self-pay | Admitting: Adult Health

## 2021-11-04 NOTE — Progress Notes (Signed)
Remote pacemaker transmission.   

## 2021-11-24 DIAGNOSIS — G301 Alzheimer's disease with late onset: Secondary | ICD-10-CM | POA: Diagnosis not present

## 2021-11-30 DIAGNOSIS — L84 Corns and callosities: Secondary | ICD-10-CM | POA: Diagnosis not present

## 2021-11-30 DIAGNOSIS — E1151 Type 2 diabetes mellitus with diabetic peripheral angiopathy without gangrene: Secondary | ICD-10-CM | POA: Diagnosis not present

## 2021-12-07 DIAGNOSIS — N390 Urinary tract infection, site not specified: Secondary | ICD-10-CM | POA: Diagnosis not present

## 2021-12-07 DIAGNOSIS — I517 Cardiomegaly: Secondary | ICD-10-CM | POA: Diagnosis not present

## 2021-12-07 DIAGNOSIS — R059 Cough, unspecified: Secondary | ICD-10-CM | POA: Diagnosis not present

## 2021-12-09 ENCOUNTER — Inpatient Hospital Stay: Payer: Medicare Other

## 2021-12-09 DIAGNOSIS — I10 Essential (primary) hypertension: Secondary | ICD-10-CM | POA: Diagnosis not present

## 2021-12-09 DIAGNOSIS — E119 Type 2 diabetes mellitus without complications: Secondary | ICD-10-CM | POA: Diagnosis not present

## 2021-12-09 DIAGNOSIS — Z79899 Other long term (current) drug therapy: Secondary | ICD-10-CM | POA: Diagnosis not present

## 2021-12-16 ENCOUNTER — Inpatient Hospital Stay: Payer: Medicare Other | Admitting: Hematology

## 2021-12-17 DIAGNOSIS — Z79899 Other long term (current) drug therapy: Secondary | ICD-10-CM | POA: Diagnosis not present

## 2021-12-19 ENCOUNTER — Emergency Department (HOSPITAL_COMMUNITY): Payer: Medicare Other

## 2021-12-19 ENCOUNTER — Encounter (HOSPITAL_COMMUNITY): Payer: Self-pay | Admitting: Emergency Medicine

## 2021-12-19 ENCOUNTER — Emergency Department (HOSPITAL_COMMUNITY)
Admission: EM | Admit: 2021-12-19 | Discharge: 2021-12-19 | Disposition: A | Discharge status: Home / Self Care | Payer: Medicare Other | Attending: Emergency Medicine | Admitting: Emergency Medicine

## 2021-12-19 ENCOUNTER — Other Ambulatory Visit: Payer: Self-pay

## 2021-12-19 DIAGNOSIS — E119 Type 2 diabetes mellitus without complications: Secondary | ICD-10-CM | POA: Insufficient documentation

## 2021-12-19 DIAGNOSIS — W06XXXA Fall from bed, initial encounter: Secondary | ICD-10-CM | POA: Diagnosis not present

## 2021-12-19 DIAGNOSIS — M542 Cervicalgia: Secondary | ICD-10-CM | POA: Insufficient documentation

## 2021-12-19 DIAGNOSIS — Z743 Need for continuous supervision: Secondary | ICD-10-CM | POA: Diagnosis not present

## 2021-12-19 DIAGNOSIS — Z7984 Long term (current) use of oral hypoglycemic drugs: Secondary | ICD-10-CM | POA: Diagnosis not present

## 2021-12-19 DIAGNOSIS — S0990XA Unspecified injury of head, initial encounter: Secondary | ICD-10-CM | POA: Diagnosis not present

## 2021-12-19 DIAGNOSIS — S161XXA Strain of muscle, fascia and tendon at neck level, initial encounter: Secondary | ICD-10-CM

## 2021-12-19 DIAGNOSIS — R52 Pain, unspecified: Secondary | ICD-10-CM | POA: Diagnosis not present

## 2021-12-19 DIAGNOSIS — Z7901 Long term (current) use of anticoagulants: Secondary | ICD-10-CM | POA: Insufficient documentation

## 2021-12-19 DIAGNOSIS — Z794 Long term (current) use of insulin: Secondary | ICD-10-CM | POA: Diagnosis not present

## 2021-12-19 DIAGNOSIS — S0081XA Abrasion of other part of head, initial encounter: Secondary | ICD-10-CM | POA: Insufficient documentation

## 2021-12-19 DIAGNOSIS — Z043 Encounter for examination and observation following other accident: Secondary | ICD-10-CM | POA: Diagnosis not present

## 2021-12-19 DIAGNOSIS — Z79899 Other long term (current) drug therapy: Secondary | ICD-10-CM | POA: Insufficient documentation

## 2021-12-19 DIAGNOSIS — W19XXXA Unspecified fall, initial encounter: Secondary | ICD-10-CM

## 2021-12-19 DIAGNOSIS — F0390 Unspecified dementia without behavioral disturbance: Secondary | ICD-10-CM | POA: Insufficient documentation

## 2021-12-19 MED ORDER — COLCHICINE 0.3 MG HALF TABLET
0.3000 mg | ORAL_TABLET | Freq: Once | ORAL | Status: DC
  Filled 2021-12-19: qty 1

## 2021-12-19 NOTE — ED Notes (Signed)
[facility] states they are coming to pick the patient up.

## 2021-12-19 NOTE — ED Notes (Signed)
Patient transported to CT 

## 2021-12-19 NOTE — Discharge Instructions (Signed)
Continue medications as previously prescribed.  Return to the emergency department if you develop severe headache, seizures/convulsions, vomiting, or for other new and concerning symptoms.

## 2021-12-19 NOTE — ED Notes (Signed)
Breakfast tray offered to pt. Pt refused.

## 2021-12-19 NOTE — ED Notes (Signed)
Pt taken to restroom via wheelchair. Pt states right knee is bothering him. Knee is warm to touch. Pt has hx of gout. Order requested for colchicine as pt takes this on a PRN bases at [facility].   Pt did have a BM. Pt brought back to room , bed straightened up and pt now resting comfortable.   Stretcher lowered and locked with call bell in reach.

## 2021-12-19 NOTE — ED Notes (Signed)
[facility] called  Pt on list for convo  back to [facility]

## 2021-12-19 NOTE — ED Provider Notes (Signed)
[facility] EMERGENCY DEPARTMENT Provider Note   CSN: [id number] Arrival date & time: 12/19/21  0025     History  Chief Complaint  Patient presents with   Jay Campbell is a 76 y.o. male.  Patient is a 76 year old male with past medical history of diabetes, GERD, dementia, atrial fibrillation on Eliquis.  Patient presenting today for evaluation of a fall.  Patient reports rolling out of bed and striking his forehead on the nightstand.  He has a small abrasion to the forehead.  He denies having lost consciousness.  He denies headache, visual disturbances, weakness, or numbness.  He does describe some neck discomfort with palpation.  The history is provided by the patient.       Home Medications Prior to Admission medications   Medication Sig Start Date End Date Taking? Authorizing Provider  allopurinol (ZYLOPRIM) 100 MG tablet Take 100 mg by mouth Daily.  12/15/11   [provider]  Ascorbic Acid 500 MG TBCR Take 1 tablet by mouth daily.    [provider]  BD INSULIN SYRINGE U/F 31G X 5/16" 0.3 ML MISC  06/26/19   [provider]  Calcium Carb-Cholecalciferol (CALCIUM 1000 + D PO) Take 1 tablet by mouth daily with breakfast. 10/28/20   [provider]  cholecalciferol (VITAMIN D3) 25 MCG (1000 UNIT) tablet Take 1,000 Units by mouth daily.    [provider]  colchicine 0.6 MG tablet Take 0.6 mg by mouth as needed.     [provider]  Continuous Blood Gluc Sensor (FREESTYLE LIBRE 14 DAY SENSOR) MISC SMARTSIG:1 Each Topical Every 2 Weeks 06/02/21   [provider]  ELIQUIS 5 MG TABS tablet Take 5 mg by mouth 2 (two) times daily. 10/18/19   [provider]  famotidine (PEPCID) 20 MG tablet Take 20 mg by mouth 2 (two) times daily. 07/05/19   [provider]  fish oil-omega-3 fatty acids 1000 MG capsule Take 1 g by mouth 2 (two) times daily.    [provider]  HUMULIN 70/30 (70-30) 100  UNIT/ML injection Inject into the skin. 05/25/21   [provider]  insulin lispro (HUMALOG) 100 UNIT/ML injection Inject into the skin. 12/21/20   [provider]  isosorbide mononitrate (IMDUR) 60 MG 24 hr tablet Take 1 tablet (60 mg total) by mouth daily. 04/17/20   Allred, Jeneen Rinks, MD  latanoprost (XALATAN) 0.005 % ophthalmic solution SMARTSIG:In Eye(s) 02/23/21   [provider]  lisinopril (ZESTRIL) 10 MG tablet Take 10 mg by mouth every evening.  12/10/18   [provider]  memantine (NAMENDA) 10 MG tablet TAKE 1 TABLET BY MOUTH TWICE  DAILY 11/02/21   Frann Rider, NP  metFORMIN (GLUCOPHAGE) 1000 MG tablet Take 1,000 mg by mouth 2 (two) times daily with a meal.    [provider]  metoprolol succinate (TOPROL-XL) 25 MG 24 hr tablet TAKE 1 TABLET BY MOUTH  DAILY 07/09/21   Allred, Jeneen Rinks, MD  nitroGLYCERIN (NITROSTAT) 0.4 MG SL tablet Place 1 tablet (0.4 mg total) under the tongue every 5 (five) minutes x 3 doses as needed. 04/15/16   Allred, Jeneen Rinks, MD  omeprazole (PRILOSEC) 20 MG capsule Take 1 capsule (20 mg total) by mouth 2 (two) times daily before a meal. 09/03/21   Varney Biles, MD  [facility] VERIO test strip  08/17/17   [provider]  sertraline (ZOLOFT) 100 MG tablet Take 100 mg by mouth daily. 04/19/19   [provider]  simvastatin (ZOCOR) 40 MG tablet Take 40 mg by mouth at bedtime.    [provider]  sucralfate (CARAFATE) 1 g tablet Take 1 tablet (1 g total) by mouth 4 (four) times daily -  with meals and at bedtime. 09/03/21   Varney Biles, MD  tamsulosin (FLOMAX) 0.4 MG CAPS capsule Take 0.4 mg by mouth daily. 09/03/20   [provider]  zinc gluconate 50 MG tablet Take 50 mg by mouth daily.    [provider]      Allergies    Contrast media [iodinated contrast media], Vioxx [rofecoxib], Shellfish allergy, and Sulfonamide derivatives    Review of Systems   Review of Systems  All other  systems reviewed and are negative.   Physical Exam Updated Vital Signs BP (!) 157/70 (BP Location: Left Arm)   Pulse (!) 59   Temp (!) 97.5 F (36.4 C) (Oral)   Resp 18   Ht 6' (1.829 m)   Wt 75 kg   SpO2 97%   BMI 22.42 kg/m  Physical Exam Vitals and nursing note reviewed.  Constitutional:      General: He is not in acute distress.    Appearance: He is well-developed. He is not diaphoretic.  HENT:     Head: Normocephalic and atraumatic.  Neck:     Comments: There is mild tenderness in the soft tissues of the upper cervical region.  There is no bony tenderness or step-off. Cardiovascular:     Rate and Rhythm: Normal rate and regular rhythm.     Heart sounds: No murmur heard.    No friction rub.  Pulmonary:     Effort: Pulmonary effort is normal. No respiratory distress.     Breath sounds: Normal breath sounds. No wheezing or rales.  Abdominal:     General: Bowel sounds are normal. There is no distension.     Palpations: Abdomen is soft.     Tenderness: There is no abdominal tenderness.  Musculoskeletal:        General: Normal range of motion.     Cervical back: Normal range of motion and neck supple.  Skin:    General: Skin is warm and dry.  Neurological:     Mental Status: He is alert and oriented to person, place, and time.     Coordination: Coordination normal.     ED Results / Procedures / Treatments   Labs (all labs ordered are listed, but only abnormal results are displayed) Labs Reviewed - No data to display  EKG None  Radiology No results found.  Procedures Procedures    Medications Ordered in ED Medications - No data to display  ED Course/ Medical Decision Making/ A&P  Patient presenting here after a fall at his extended care facility.  He has history of dementia and adds little additional history.  From what I am told, he fell out of bed and hit his head on the nightstand.  He has a superficial abrasion to the forehead, but no laceration in  need of repair.  He arrives here with stable vital signs and is clinically well-appearing.  His physical examination is unremarkable.  Neuro exam is nonfocal.  There is no evidence for hip injury.  He has painless range of motion in all directions.  Motor and sensation are intact throughout all extremities.  Imaging studies ordered including CT scan of the head and cervical spine.  These were both unremarkable.  I feel as though patient can safely be  discharged with as needed follow-up.  Final Clinical Impression(s) / ED Diagnoses Final diagnoses:  None    Rx / DC Orders ED Discharge Orders     None         Veryl Speak, MD 12/19/21 0200

## 2021-12-19 NOTE — ED Notes (Addendum)
Pt a/w EMS transport back to [facility]

## 2021-12-19 NOTE — ED Triage Notes (Addendum)
Pt to ed via [facility] from [facility]. C/o fall from bed hitting right forehead on nightstand. Pt on eliquis. No active bleeding. Pt noted to have small cut to right forehead and scrape to right temple. Pt has a hx of dementia.

## 2021-12-21 DIAGNOSIS — I7091 Generalized atherosclerosis: Secondary | ICD-10-CM | POA: Diagnosis not present

## 2021-12-21 DIAGNOSIS — K219 Gastro-esophageal reflux disease without esophagitis: Secondary | ICD-10-CM | POA: Diagnosis not present

## 2021-12-21 DIAGNOSIS — E119 Type 2 diabetes mellitus without complications: Secondary | ICD-10-CM | POA: Diagnosis not present

## 2021-12-21 DIAGNOSIS — G301 Alzheimer's disease with late onset: Secondary | ICD-10-CM | POA: Diagnosis not present

## 2021-12-21 DIAGNOSIS — D649 Anemia, unspecified: Secondary | ICD-10-CM | POA: Diagnosis not present

## 2021-12-21 DIAGNOSIS — M109 Gout, unspecified: Secondary | ICD-10-CM | POA: Diagnosis not present

## 2021-12-21 DIAGNOSIS — E559 Vitamin D deficiency, unspecified: Secondary | ICD-10-CM | POA: Diagnosis not present

## 2021-12-21 DIAGNOSIS — M25561 Pain in right knee: Secondary | ICD-10-CM | POA: Diagnosis not present

## 2021-12-21 DIAGNOSIS — R296 Repeated falls: Secondary | ICD-10-CM | POA: Diagnosis not present

## 2021-12-21 DIAGNOSIS — I5042 Chronic combined systolic (congestive) and diastolic (congestive) heart failure: Secondary | ICD-10-CM | POA: Diagnosis not present

## 2021-12-24 DIAGNOSIS — D649 Anemia, unspecified: Secondary | ICD-10-CM | POA: Diagnosis not present

## 2021-12-27 ENCOUNTER — Telehealth: Payer: Self-pay

## 2021-12-27 NOTE — Telephone Encounter (Signed)
        Patient  visited [name] on 10/8  Telephone encounter attempt :  1st  A HIPAA compliant voice message was left requesting a return call.  Instructed patient to call back    [place], [employer]  [phone] [address], [place], [place] [address] Phone: [phone] Email: [email or web address]

## 2021-12-28 ENCOUNTER — Telehealth: Payer: Self-pay

## 2021-12-28 NOTE — Telephone Encounter (Signed)
        Patient  visited [name] on 10/8    Telephone encounter attempt :  2nd  A HIPAA compliant voice message was left requesting a return call.  Instructed patient to call back    [place] [employer]  [phone] [address], [place], [place] [address] Phone: [phone] Email: [email or web address]

## 2021-12-31 DIAGNOSIS — E559 Vitamin D deficiency, unspecified: Secondary | ICD-10-CM | POA: Diagnosis not present

## 2021-12-31 DIAGNOSIS — I5042 Chronic combined systolic (congestive) and diastolic (congestive) heart failure: Secondary | ICD-10-CM | POA: Diagnosis not present

## 2021-12-31 DIAGNOSIS — D649 Anemia, unspecified: Secondary | ICD-10-CM | POA: Diagnosis not present

## 2021-12-31 DIAGNOSIS — E119 Type 2 diabetes mellitus without complications: Secondary | ICD-10-CM | POA: Diagnosis not present

## 2021-12-31 DIAGNOSIS — M1A9XX0 Chronic gout, unspecified, without tophus (tophi): Secondary | ICD-10-CM | POA: Diagnosis not present

## 2022-01-02 DIAGNOSIS — F02B0 Dementia in other diseases classified elsewhere, moderate, without behavioral disturbance, psychotic disturbance, mood disturbance, and anxiety: Secondary | ICD-10-CM | POA: Diagnosis not present

## 2022-01-02 DIAGNOSIS — G301 Alzheimer's disease with late onset: Secondary | ICD-10-CM | POA: Diagnosis not present

## 2022-01-03 ENCOUNTER — Inpatient Hospital Stay: Payer: Medicare Other | Attending: Hematology

## 2022-01-03 DIAGNOSIS — Z825 Family history of asthma and other chronic lower respiratory diseases: Secondary | ICD-10-CM | POA: Insufficient documentation

## 2022-01-03 DIAGNOSIS — F0284 Dementia in other diseases classified elsewhere, unspecified severity, with anxiety: Secondary | ICD-10-CM | POA: Insufficient documentation

## 2022-01-03 DIAGNOSIS — Z801 Family history of malignant neoplasm of trachea, bronchus and lung: Secondary | ICD-10-CM | POA: Diagnosis not present

## 2022-01-03 DIAGNOSIS — R42 Dizziness and giddiness: Secondary | ICD-10-CM | POA: Diagnosis not present

## 2022-01-03 DIAGNOSIS — D509 Iron deficiency anemia, unspecified: Secondary | ICD-10-CM | POA: Insufficient documentation

## 2022-01-03 DIAGNOSIS — Z8249 Family history of ischemic heart disease and other diseases of the circulatory system: Secondary | ICD-10-CM | POA: Insufficient documentation

## 2022-01-03 DIAGNOSIS — Z836 Family history of other diseases of the respiratory system: Secondary | ICD-10-CM | POA: Insufficient documentation

## 2022-01-03 DIAGNOSIS — Z882 Allergy status to sulfonamides status: Secondary | ICD-10-CM | POA: Insufficient documentation

## 2022-01-03 DIAGNOSIS — Z8719 Personal history of other diseases of the digestive system: Secondary | ICD-10-CM | POA: Insufficient documentation

## 2022-01-03 DIAGNOSIS — D472 Monoclonal gammopathy: Secondary | ICD-10-CM | POA: Diagnosis not present

## 2022-01-03 DIAGNOSIS — Z7901 Long term (current) use of anticoagulants: Secondary | ICD-10-CM | POA: Diagnosis not present

## 2022-01-03 DIAGNOSIS — R410 Disorientation, unspecified: Secondary | ICD-10-CM | POA: Insufficient documentation

## 2022-01-03 DIAGNOSIS — D696 Thrombocytopenia, unspecified: Secondary | ICD-10-CM | POA: Diagnosis not present

## 2022-01-03 DIAGNOSIS — F32A Depression, unspecified: Secondary | ICD-10-CM | POA: Diagnosis not present

## 2022-01-03 DIAGNOSIS — Z79899 Other long term (current) drug therapy: Secondary | ICD-10-CM | POA: Insufficient documentation

## 2022-01-03 DIAGNOSIS — I4891 Unspecified atrial fibrillation: Secondary | ICD-10-CM | POA: Insufficient documentation

## 2022-01-03 DIAGNOSIS — D539 Nutritional anemia, unspecified: Secondary | ICD-10-CM

## 2022-01-03 LAB — COMPREHENSIVE METABOLIC PANEL
ALT: 13 U/L (ref 0–44)
AST: 21 U/L (ref 15–41)
Albumin: 3.4 g/dL — ABNORMAL LOW (ref 3.5–5.0)
Alkaline Phosphatase: 129 U/L — ABNORMAL HIGH (ref 38–126)
Anion gap: 7 (ref 5–15)
BUN: 35 mg/dL — ABNORMAL HIGH (ref 8–23)
CO2: 25 mmol/L (ref 22–32)
Calcium: 8.8 mg/dL — ABNORMAL LOW (ref 8.9–10.3)
Chloride: 106 mmol/L (ref 98–111)
Creatinine, Ser: 1.44 mg/dL — ABNORMAL HIGH (ref 0.61–1.24)
GFR, Estimated: 50 mL/min — ABNORMAL LOW (ref >60)
Glucose, Bld: 143 mg/dL — ABNORMAL HIGH (ref 70–99)
Potassium: 5.1 mmol/L (ref 3.5–5.1)
Sodium: 138 mmol/L (ref 135–145)
Total Bilirubin: 0.5 mg/dL (ref 0.3–1.2)
Total Protein: 7.4 g/dL (ref 6.5–8.1)

## 2022-01-03 LAB — CBC WITH DIFFERENTIAL/PLATELET
Abs Immature Granulocytes: 0 10*3/uL (ref 0.00–0.07)
Basophils Absolute: 0 10*3/uL (ref 0.0–0.1)
Basophils Relative: 0 %
Eosinophils Absolute: 0 10*3/uL (ref 0.0–0.5)
Eosinophils Relative: 1 %
HCT: 32.2 % — ABNORMAL LOW (ref 39.0–52.0)
Hemoglobin: 10.4 g/dL — ABNORMAL LOW (ref 13.0–17.0)
Immature Granulocytes: 0 %
Lymphocytes Relative: 25 %
Lymphs Abs: 0.6 10*3/uL — ABNORMAL LOW (ref 0.7–4.0)
MCH: 34.2 pg — ABNORMAL HIGH (ref 26.0–34.0)
MCHC: 32.3 g/dL (ref 30.0–36.0)
MCV: 105.9 fL — ABNORMAL HIGH (ref 80.0–100.0)
Monocytes Absolute: 0.3 10*3/uL (ref 0.1–1.0)
Monocytes Relative: 11 %
Neutro Abs: 1.6 10*3/uL — ABNORMAL LOW (ref 1.7–7.7)
Neutrophils Relative %: 63 %
Platelets: 92 10*3/uL — ABNORMAL LOW (ref 150–400)
RBC: 3.04 MIL/uL — ABNORMAL LOW (ref 4.22–5.81)
RDW: 12.7 % (ref 11.5–15.5)
WBC: 2.5 10*3/uL — ABNORMAL LOW (ref 4.0–10.5)
nRBC: 0 % (ref 0.0–0.2)

## 2022-01-03 LAB — IRON AND TIBC
Iron: 78 ug/dL (ref 45–182)
Saturation Ratios: 21 % (ref 17.9–39.5)
TIBC: 376 ug/dL (ref 250–450)
UIBC: 298 ug/dL

## 2022-01-03 LAB — LACTATE DEHYDROGENASE: LDH: 133 U/L (ref 98–192)

## 2022-01-03 LAB — FERRITIN: Ferritin: 31 ng/mL (ref 24–336)

## 2022-01-04 LAB — KAPPA/LAMBDA LIGHT CHAINS
Kappa free light chain: 105 mg/L — ABNORMAL HIGH (ref 3.3–19.4)
Kappa, lambda light chain ratio: 3.49 — ABNORMAL HIGH (ref 0.26–1.65)
Lambda free light chains: 30.1 mg/L — ABNORMAL HIGH (ref 5.7–26.3)

## 2022-01-06 LAB — PROTEIN ELECTROPHORESIS, SERUM
A/G Ratio: 0.9 (ref 0.7–1.7)
Albumin ELP: 3.2 g/dL (ref 2.9–4.4)
Alpha-1-Globulin: 0.2 g/dL (ref 0.0–0.4)
Alpha-2-Globulin: 0.6 g/dL (ref 0.4–1.0)
Beta Globulin: 0.8 g/dL (ref 0.7–1.3)
Gamma Globulin: 2 g/dL — ABNORMAL HIGH (ref 0.4–1.8)
Globulin, Total: 3.7 g/dL (ref 2.2–3.9)
M-Spike, %: 1.5 g/dL — ABNORMAL HIGH
Total Protein ELP: 6.9 g/dL (ref 6.0–8.5)

## 2022-01-11 ENCOUNTER — Encounter: Payer: Self-pay | Admitting: Hematology

## 2022-01-11 ENCOUNTER — Inpatient Hospital Stay (HOSPITAL_BASED_OUTPATIENT_CLINIC_OR_DEPARTMENT_OTHER): Payer: Medicare Other | Admitting: Hematology

## 2022-01-11 VITALS — BP 159/91 | HR 63 | Temp 97.5°F | Resp 18 | Wt 187.9 lb

## 2022-01-11 DIAGNOSIS — I4891 Unspecified atrial fibrillation: Secondary | ICD-10-CM | POA: Diagnosis not present

## 2022-01-11 DIAGNOSIS — D472 Monoclonal gammopathy: Secondary | ICD-10-CM | POA: Diagnosis not present

## 2022-01-11 DIAGNOSIS — F0284 Dementia in other diseases classified elsewhere, unspecified severity, with anxiety: Secondary | ICD-10-CM | POA: Diagnosis not present

## 2022-01-11 DIAGNOSIS — Z882 Allergy status to sulfonamides status: Secondary | ICD-10-CM | POA: Diagnosis not present

## 2022-01-11 DIAGNOSIS — Z801 Family history of malignant neoplasm of trachea, bronchus and lung: Secondary | ICD-10-CM | POA: Diagnosis not present

## 2022-01-11 DIAGNOSIS — Z836 Family history of other diseases of the respiratory system: Secondary | ICD-10-CM | POA: Diagnosis not present

## 2022-01-11 DIAGNOSIS — Z8249 Family history of ischemic heart disease and other diseases of the circulatory system: Secondary | ICD-10-CM | POA: Diagnosis not present

## 2022-01-11 DIAGNOSIS — D696 Thrombocytopenia, unspecified: Secondary | ICD-10-CM | POA: Diagnosis not present

## 2022-01-11 DIAGNOSIS — D509 Iron deficiency anemia, unspecified: Secondary | ICD-10-CM | POA: Insufficient documentation

## 2022-01-11 DIAGNOSIS — D539 Nutritional anemia, unspecified: Secondary | ICD-10-CM

## 2022-01-11 DIAGNOSIS — F32A Depression, unspecified: Secondary | ICD-10-CM | POA: Diagnosis not present

## 2022-01-11 DIAGNOSIS — Z8719 Personal history of other diseases of the digestive system: Secondary | ICD-10-CM | POA: Diagnosis not present

## 2022-01-11 DIAGNOSIS — R42 Dizziness and giddiness: Secondary | ICD-10-CM | POA: Diagnosis not present

## 2022-01-11 DIAGNOSIS — Z7901 Long term (current) use of anticoagulants: Secondary | ICD-10-CM | POA: Diagnosis not present

## 2022-01-11 DIAGNOSIS — Z79899 Other long term (current) drug therapy: Secondary | ICD-10-CM | POA: Diagnosis not present

## 2022-01-11 DIAGNOSIS — R410 Disorientation, unspecified: Secondary | ICD-10-CM | POA: Diagnosis not present

## 2022-01-11 DIAGNOSIS — Z825 Family history of asthma and other chronic lower respiratory diseases: Secondary | ICD-10-CM | POA: Diagnosis not present

## 2022-01-11 NOTE — Progress Notes (Signed)
Jay Campbell, [place] [address]   CLINIC:  Medical Oncology/Hematology  PCP:  Monico Blitz, [place] / [place] [address]  [phone]  REASON FOR VISIT:  Follow-up for smoldering multiple myeloma, macrocytic anemia and thrombocytopenia  PRIOR THERAPY: none  CURRENT THERAPY: surveillance  INTERVAL HISTORY:  [name], a 76 y.o. male, returns for follow-up of for smoldering myeloma, microcytic anemia and thrombocytopenia.  He is currently resident at a nursing facility due to dementia.  Denies any bleeding per rectum or melena.  REVIEW OF SYSTEMS:  Review of Systems  Neurological:  Positive for dizziness.  Psychiatric/Behavioral:  Positive for confusion and depression. The patient is nervous/anxious.   All other systems reviewed and are negative.   PAST MEDICAL/SURGICAL HISTORY:  Past Medical History:  Diagnosis Date   Alzheimer's dementia ([place])    Anxiety    Atrial fibrillation ([place])    Permanent   Coronary artery disease    Mild nonobstructive 1/12   DM (diabetes mellitus) ([place])    GERD (gastroesophageal reflux disease)    Gout    Hearing disorder, cochlear    Hiatal hernia    HLD (hyperlipidemia)    HTN (hypertension)    S/P AV nodal ablation    s/p SJM PPM; gen change 02-01-13 by Dr Rayann Heman   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   Warfarin anticoagulation    Past Surgical History:  Procedure Laterality Date   CATARACT EXTRACTION Right    COCHLEAR IMPLANT Right    HEMORRHOID BANDING     KNEE ARTHROSCOPY Right 1992   Dr Luna Glasgow    PACEMAKER GENERATOR CHANGE Bilateral 02/01/2013   Procedure: PACEMAKER GENERATOR CHANGE;  Surgeon: Coralyn Mark, MD;  [facility] C[facility] LAB;  Service: Cardiovascular;  Laterality: Bilateral;   PACEMAKER PLACEMENT  2002; 2014   SJM implanted by Dr Lovena Le with AV nodal ablation performed; gen change to [employer] SR RF by Dr Rayann Heman 02-01-13   RETINAL LASER PROCEDURE Left    SLT  LASER APPLICATION Left 11/19/3530   Procedure: SLT LASER APPLICATION;  Surgeon: Williams Che, MD;  Location: [facility];  Service: Ophthalmology;  Laterality: Left;    SOCIAL HISTORY:  Social History   Socioeconomic History   Marital status: Married    Spouse name: Not on file   Number of children: Not on file   Years of education: 11   Highest education level: Not on file  Occupational History   Not on file  Tobacco Use   Smoking status: Never   Smokeless tobacco: Never  Vaping Use   Vaping Use: Never used  Substance and Sexual Activity   Alcohol use: No    Alcohol/week: 0.0 standard drinks of alcohol   Drug use: No   Sexual activity: Not Currently  Other Topics Concern   Not on file  Social History Narrative   Not on file   Social Determinants of Health   Financial Resource Strain: Not on file  Food Insecurity: Not on file  Transportation Needs: Not on file  Physical Activity: Not on file  Stress: Not on file  Social Connections: Not on file  Intimate Partner Violence: Not on file    FAMILY HISTORY:  Family History  Problem Relation Age of Onset   COPD Mother    Emphysema Mother    Cancer - Lung Father    Heart disease Paternal Uncle    Stomach cancer Neg Hx    Colon  cancer Neg Hx     CURRENT MEDICATIONS:  Current Outpatient Medications  Medication Sig Dispense Refill   allopurinol (ZYLOPRIM) 100 MG tablet Take 100 mg by mouth Daily.      Ascorbic Acid 500 MG TBCR Take 1 tablet by mouth daily.     BD INSULIN SYRINGE U/F 31G X 5/16" 0.3 ML MISC      Calcium Carb-Cholecalciferol (CALCIUM 1000 + D PO) Take 1 tablet by mouth daily with breakfast.     cholecalciferol (VITAMIN D3) 25 MCG (1000 UNIT) tablet Take 1,000 Units by mouth daily.     colchicine 0.6 MG tablet Take 0.6 mg by mouth as needed.      Continuous Blood Gluc Sensor (FREESTYLE LIBRE 14 DAY SENSOR) MISC SMARTSIG:1 Each Topical Every 2 Weeks     ELIQUIS 5 MG TABS tablet Take 5 mg by mouth 2 (two)  times daily.     famotidine (PEPCID) 20 MG tablet Take 20 mg by mouth 2 (two) times daily.     fish oil-omega-3 fatty acids 1000 MG capsule Take 1 g by mouth 2 (two) times daily.     HUMULIN 70/30 (70-30) 100 UNIT/ML injection Inject into the skin.     insulin lispro (HUMALOG) 100 UNIT/ML injection Inject into the skin.     isosorbide mononitrate (IMDUR) 60 MG 24 hr tablet Take 1 tablet (60 mg total) by mouth daily. 30 tablet 6   latanoprost (XALATAN) 0.005 % ophthalmic solution SMARTSIG:In Eye(s)     lisinopril (ZESTRIL) 10 MG tablet Take 10 mg by mouth every evening.      memantine (NAMENDA) 10 MG tablet TAKE 1 TABLET BY MOUTH TWICE  DAILY 200 tablet 2   metFORMIN (GLUCOPHAGE) 1000 MG tablet Take 1,000 mg by mouth 2 (two) times daily with a meal.     metoprolol succinate (TOPROL-XL) 25 MG 24 hr tablet TAKE 1 TABLET BY MOUTH  DAILY 90 tablet 3   nitroGLYCERIN (NITROSTAT) 0.4 MG SL tablet Place 1 tablet (0.4 mg total) under the tongue every 5 (five) minutes x 3 doses as needed. 25 tablet 3   omeprazole (PRILOSEC) 20 MG capsule Take 1 capsule (20 mg total) by mouth 2 (two) times daily before a meal. 60 capsule 0   ONETOUCH VERIO test strip      sertraline (ZOLOFT) 100 MG tablet Take 100 mg by mouth daily.     simvastatin (ZOCOR) 40 MG tablet Take 40 mg by mouth at bedtime.     sucralfate (CARAFATE) 1 g tablet Take 1 tablet (1 g total) by mouth 4 (four) times daily -  with meals and at bedtime. 90 tablet 0   tamsulosin (FLOMAX) 0.4 MG CAPS capsule Take 0.4 mg by mouth daily.     zinc gluconate 50 MG tablet Take 50 mg by mouth daily.     No current facility-administered medications for this visit.    ALLERGIES:  Allergies  Allergen Reactions   Contrast Media [Iodinated Contrast Media]    Vioxx [Rofecoxib]     On pt MAR   Shellfish Allergy Itching and Rash   Sulfonamide Derivatives Itching and Rash    PHYSICAL EXAM:  Performance status (ECOG): 1 - Symptomatic but completely  ambulatory  Vitals:   01/11/22 1117  BP: (!) 159/91  Pulse: 63  Resp: 18  Temp: (!) 97.5 F (36.4 C)  SpO2: 97%   Wt Readings from Last 3 Encounters:  01/11/22 187 lb 14.4 oz (85.2 kg)  12/19/21 165 lb 5.5  oz (75 kg)  10/04/21 164 lb 0.4 oz (74.4 kg)   Physical Exam Vitals reviewed.  Constitutional:      Appearance: Normal appearance.  Cardiovascular:     Rate and Rhythm: Normal rate and regular rhythm.     Pulses: Normal pulses.     Heart sounds: Normal heart sounds.  Pulmonary:     Effort: Pulmonary effort is normal.     Breath sounds: Normal breath sounds.  Neurological:     General: No focal deficit present.     Mental Status: He is alert. He is disoriented.  Psychiatric:        Mood and Affect: Mood normal.        Behavior: Behavior normal.     LABORATORY DATA:  I have reviewed the labs as listed.     Latest Ref Rng & Units 01/03/2022   11:27 AM 09/03/2021    4:06 PM 06/02/2021   12:47 PM  CBC  WBC 4.0 - 10.5 K/uL 2.5  3.7  5.0   Hemoglobin 13.0 - 17.0 g/dL 10.4  11.1  12.3   Hematocrit 39.0 - 52.0 % 32.2  33.2  35.8   Platelets 150 - 400 K/uL 92  109  147       Latest Ref Rng & Units 01/03/2022   11:27 AM 09/03/2021    4:06 PM 06/02/2021   12:47 PM  CMP  Glucose 70 - 99 mg/dL 143  159  156   BUN 8 - 23 mg/dL 35  31  28   Creatinine 0.61 - 1.24 mg/dL 1.44  1.31  1.19   Sodium 135 - 145 mmol/L 138  137  138   Potassium 3.5 - 5.1 mmol/L 5.1  4.7  4.2   Chloride 98 - 111 mmol/L 106  107  103   CO2 22 - 32 mmol/L _0 Calcium 8.9 - 10.3 mg/dL 8.8  8.8  9.1   Total Protein 6.5 - 8.1 g/dL 7.4   8.3   Total Bilirubin 0.3 - 1.2 mg/dL 0.5   1.1   Alkaline Phos 38 - 126 U/L 129   124   AST 15 - 41 U/L 21   25   ALT 0 - 44 U/L 13   20       Component Value Date/Time   RBC 3.04 (L) 01/03/2022 1127   MCV 105.9 (H) 01/03/2022 1127   MCH 34.2 (H) 01/03/2022 1127   MCHC 32.3 01/03/2022 1127   RDW 12.7 01/03/2022 1127   LYMPHSABS 0.6 (L) 01/03/2022  1127   MONOABS 0.3 01/03/2022 1127   EOSABS 0.0 01/03/2022 1127   BASOSABS 0.0 01/03/2022 1127    DIAGNOSTIC IMAGING:  I have independently reviewed the scans and discussed with the patient. CT Head Wo Contrast  Result Date: 12/19/2021 CLINICAL DATA:  Fall, right forehead injury, on Eliquis EXAM: CT HEAD WITHOUT CONTRAST CT CERVICAL SPINE WITHOUT CONTRAST TECHNIQUE: Multidetector CT imaging of the head and cervical spine was performed following the standard protocol without intravenous contrast. Multiplanar CT image reconstructions of the cervical spine were also generated. RADIATION DOSE REDUCTION: This exam was performed according to the departmental dose-optimization program which includes automated exposure control, adjustment of the mA and/or kV according to patient size and/or use of iterative reconstruction technique. COMPARISON:  CT head dated 10/04/2021. CT cervical spine dated 10/02/2018. FINDINGS: CT HEAD FINDINGS Brain: No evidence of acute infarction, hemorrhage, hydrocephalus, extra-axial collection or mass lesion/mass effect. Subcortical  white matter and periventricular small vessel ischemic changes. Vascular: Intracranial atherosclerosis. Skull: Normal. Negative for fracture or focal lesion. Sinuses/Orbits: The visualized paranasal sinuses are essentially clear. The mastoid air cells are unopacified. Other: Very mild soft tissue swelling overlying the right frontal bone (series 2/image 11). CT CERVICAL SPINE FINDINGS Alignment: Normal cervical lordosis. Skull base and vertebrae: No acute fracture. No primary bone lesion or focal pathologic process. Soft tissues and spinal canal: No prevertebral fluid or swelling. No visible canal hematoma. Disc levels: Intervertebral disc spaces are maintained. Spinal canal is patent. Upper chest: Visualized lung apices are clear. Other: Visualized thyroid is unremarkable. IMPRESSION: Very mild soft tissue swelling overlying the right frontal bone. No  evidence of acute intracranial abnormality. Small vessel ischemic changes. No evidence of traumatic injury to the cervical spine. Electronically Signed   By: Julian Hy M.D.   On: 12/19/2021 01:51   CT Cervical Spine Wo Contrast  Result Date: 12/19/2021 CLINICAL DATA:  Fall, right forehead injury, on Eliquis EXAM: CT HEAD WITHOUT CONTRAST CT CERVICAL SPINE WITHOUT CONTRAST TECHNIQUE: Multidetector CT imaging of the head and cervical spine was performed following the standard protocol without intravenous contrast. Multiplanar CT image reconstructions of the cervical spine were also generated. RADIATION DOSE REDUCTION: This exam was performed according to the departmental dose-optimization program which includes automated exposure control, adjustment of the mA and/or kV according to patient size and/or use of iterative reconstruction technique. COMPARISON:  CT head dated 10/04/2021. CT cervical spine dated 10/02/2018. FINDINGS: CT HEAD FINDINGS Brain: No evidence of acute infarction, hemorrhage, hydrocephalus, extra-axial collection or mass lesion/mass effect. Subcortical white matter and periventricular small vessel ischemic changes. Vascular: Intracranial atherosclerosis. Skull: Normal. Negative for fracture or focal lesion. Sinuses/Orbits: The visualized paranasal sinuses are essentially clear. The mastoid air cells are unopacified. Other: Very mild soft tissue swelling overlying the right frontal bone (series 2/image 11). CT CERVICAL SPINE FINDINGS Alignment: Normal cervical lordosis. Skull base and vertebrae: No acute fracture. No primary bone lesion or focal pathologic process. Soft tissues and spinal canal: No prevertebral fluid or swelling. No visible canal hematoma. Disc levels: Intervertebral disc spaces are maintained. Spinal canal is patent. Upper chest: Visualized lung apices are clear. Other: Visualized thyroid is unremarkable. IMPRESSION: Very mild soft tissue swelling overlying the right  frontal bone. No evidence of acute intracranial abnormality. Small vessel ischemic changes. No evidence of traumatic injury to the cervical spine. Electronically Signed   By: Julian Hy M.D.   On: 12/19/2021 01:51     ASSESSMENT:  1.  IgA kappa smoldering myeloma: -Bone marrow biopsy on 09/05/2017 shows 12% plasma cells. -Skeletal survey on 11/29/2019 was negative for lytic lesions.   2.  Macrocytic anemia: -Hemoglobin is 12 and hematocrit is 36.  MCV is 107.  Previous work-up for N82, folic acid was normal.   3.  Thrombocytopenia: -He has mild to moderate thrombocytopenia.  Platelet count is 99. -CT of the abdomen and pelvis on 04/11/2017 showed normal spleen. -Bone marrow biopsy showed normal megakaryocytes.  Most likely etiology is ITP.   PLAN:  1.  IgA kappa smoldering myeloma: -No new bone pains reported. - Reviewed labs from 01/03/2022.  M spike is 1.5 g.  Kappa light chains are 105 and ratio is 3.49.  Hemoglobin is 10.4 and creatinine is 1.4.  Calcium is 8.8. - No "crab" features to start treatment at this time. - Recommend follow-up in 6 months with repeat labs.   2.  Macrocytic anemia: - Hemoglobin is 10.4.  Ferritin is 31.  Anemia from CKD and relative iron deficiency. - Recommend Feraheme weekly x2.  Discussed side effects in detail.   3.  Thrombocytopenia: - Mild to moderate thrombocytopenia stable.  Platelet count is 92.  No bleeding issues.  Orders placed this encounter:  Orders Placed This Encounter  Procedures   CBC with Differential   Comprehensive metabolic panel   Kappa/lambda light chains   Protein electrophoresis, serum   Iron and TIBC ([facility] ONLY)   Ferritin      Derek Jack, MD [place] [phone]

## 2022-01-11 NOTE — Patient Instructions (Addendum)
[place] at [facility] Discharge Instructions   You were seen and examined today by Dr. Delton Coombes.  He reviewed the results of your lab work which are mostly stable. Your hemoglobin is low at 10.4, down from last time. And you have a slight degree of kidney dysfunction. Your iron levels are also low. This can affect you hemoglobin and cause it to be low.   We will plan to give you IV iron to help get you hemoglobin closer to normal.  Return as scheduled.    Thank you for choosing [place] at [facility] to provide your oncology and hematology care.  To afford each patient quality time with our provider, please arrive at least 15 minutes before your scheduled appointment time.   If you have a lab appointment with the [facility] please come in thru the Main Entrance and check in at the main information desk.  You need to re-schedule your appointment should you arrive 10 or more minutes late.  We strive to give you quality time with our providers, and arriving late affects you and other patients whose appointments are after yours.  Also, if you no show three or more times for appointments you may be dismissed from the clinic at the providers discretion.     Again, thank you for choosing [facility].  Our hope is that these requests will decrease the amount of time that you wait before being seen by our physicians.       _____________________________________________________________  Should you have questions after your visit to [facility], please contact our office at [phone] and follow the prompts.  Our office hours are 8:00 a.m. and 4:30 p.m. Monday - Friday.  Please note that voicemails left after 4:00 p.m. may not be returned until the following business day.  We are closed weekends and major holidays.  You do have access to a nurse 24-7, just call the main number to the clinic [phone] and do not press  any options, hold on the line and a nurse will answer the phone.    For prescription refill requests, have your pharmacy contact our office and allow 72 hours.    Due to Covid, you will need to wear a mask upon entering the hospital. If you do not have a mask, a mask will be given to you at the Main Entrance upon arrival. For doctor visits, patients may have 1 support person age 48 or older with them. For treatment visits, patients can not have anyone with them due to social distancing guidelines and our immunocompromised population.

## 2022-01-12 NOTE — Progress Notes (Unsigned)
[facility] [address] [place]. [place] [address] [phone]       OFFICE FOLLOW UP NOTE  [name] Date of Birth:  18-Aug-1945 Medical Record Number:  [id number]   Referring MD: Jay Campbell  Reason for Referral: Dementia  No chief complaint on file.      HPI:   Update 01/13/2022 JM: Patient returns for yearly dementia follow-up.  Reports cognition ***.  MMSE today ***/30 (prior 15/30).  Remains on Namenda 10 mg twice daily.       History provided for reference purposes only Update 01/13/2021 JM: Returns for yearly follow-up re: dementia likely Alzheimer's type.  He is accompanied by his wife, Jay Campbell.  Overall well since prior visit.  Cognition has been stable per wife although can fluctuate.  Remains on Namenda 10 mg twice daily -denies side effects.  MMSE today 15/30 (prior 17/30). At times can be more fatigued and takes more naps during the day. At times, can have difficulty sleeping at night. Denies behavioral concerns.  No new concerns at this time.   Update 01/13/2020 JM: Jay Campbell returns for follow-up accompanied by his wife regarding likely Alzheimer's dementia worsening after a fall in 09/2018.  Previously seen by Dr. Leonie Campbell for initial visit on 12/05/2018 and recommended 40-monthfollow-up but has not had a follow-up until this time.  Dementia panel obtained after prior visit which was within normal limits.  EEG showed mild generalized slowing correlating with diagnosis of memory loss and dementia without evidence of seizure activity.  He has remained on Namenda 10 mg twice daily tolerating well without side effects.  Since initial visit over 1 year ago, cognition has been stable without worsening.  MMSE today 17/30 previously 20/30.  No behavioral concerns such as delusions, hallucinations, agitation or safety concerns.  No concerns at this time.  Initial consult visit 12/05/2018 Dr. SLeonie Campbell [name] 76year old Caucasian male seen today for  initial office consultation visit dementia and confusion following recent head injury.  History is obtained from the patient and his wife who is accompanying him as well as review of referral notes and have personally reviewed imaging films in PACS.  He has a past medical history of diabetes, hypertension, hyperlipidemia lipidemia, paroxysmal A. fib on long-term warfarin, CHF, pacemaker placement and depression.  Patient wife states that he is had longstanding history of greater than a year of mild cognitive impairment and suspected dementia which was not never formally diagnosed with he was doing well at home and was compensating.  He had a fall in July 2020 when he hit his head and fell to the ground and dislodged his cochlear implant from his right mastoid region.  He was confused weak and disoriented.  [facility]  CT scan of the head on admission on 10/11/2018 showed no acute abnormality.  Repeat CT scan on 10/15/2018 also showed no acute abnormality.  MRI could not be obtained since he has a pacemaker.  Admission labs unremarkable CBC and complete metabolic panel labs were normal.  UA showed no infection.  Patient's cognitive status and confusion has never returned back to baseline following this and his wife is concerned about dementia.  Patient did not have any focal neurological deficits in the form of slurred speech, extremity weakness numbness loss of vision during this hospitalization.  Patient primary physician felt that he likely had a stroke even though it was not seen on CT scan.  Patient did  have some headaches after this episode which appear to have improved however his confusion and cognitive decline have persisted.  Patient does not have any family history of Alzheimer's.  There is no prior history of strokes, seizures, loss of consciousness or major head injury with loss of consciousness.  Patient has never had any lab work for reversible causes of  dementia or a trial of medication like Aricept or Namenda.  He continues to have poor appetite and his intake is not good though his wife has convinced him to take Glucerna nutritional supplement.  There are no delusions, hallucinations, agitations, unsafe behavior.  He can walk independently and has not had recurrent falls.   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   Warfarin anticoagulation     Social History:  Social History   Socioeconomic History   Marital status: Married    Spouse name: Not on file   Number of children: Not on file   Years of education: 11   Highest education level: Not on file  Occupational History   Not on file  Tobacco Use   Smoking status: Never   Smokeless tobacco: Never  Vaping Use   Vaping Use: Never used  Substance and Sexual Activity   Alcohol use: No    Alcohol/week: 0.0 standard drinks of alcohol   Drug use: No   Sexual activity: Not Currently  Other Topics Concern   Not on file  Social History Narrative   Not on file   Social Determinants of Health   Financial Resource Strain: Not on file  Food Insecurity: Not on file  Transportation Needs: Not on file  Physical Activity: Not on file  Stress: Not on file  Social Connections: Not on file  Intimate Partner Violence: Not on file    Medications:   Current Outpatient Medications on File Prior to Visit  Medication Sig Dispense Refill   allopurinol  (ZYLOPRIM) 100 MG tablet Take 100 mg by mouth Daily.      Ascorbic Acid 500 MG TBCR Take 1 tablet by mouth daily.     BD INSULIN SYRINGE U/F 31G X 5/16" 0.3 ML MISC      Calcium Carb-Cholecalciferol (CALCIUM 1000 + D PO) Take 1 tablet by mouth daily with breakfast.     cholecalciferol (VITAMIN D3) 25 MCG (1000 UNIT) tablet Take 1,000 Units by mouth daily.     colchicine 0.6 MG tablet Take 0.6 mg by mouth as needed.      Continuous Blood Gluc Sensor (FREESTYLE LIBRE 14 DAY SENSOR) MISC SMARTSIG:1 Each Topical Every 2 Weeks     ELIQUIS 5 MG TABS tablet Take 5 mg by mouth 2 (two) times daily.     famotidine (PEPCID) 20 MG tablet Take 20 mg by mouth 2 (two) times daily.     fish oil-omega-3 fatty  acids 1000 MG capsule Take 1 g by mouth 2 (two) times daily.     HUMULIN 70/30 (70-30) 100 UNIT/ML injection Inject into the skin.     insulin lispro (HUMALOG) 100 UNIT/ML injection Inject into the skin.     isosorbide mononitrate (IMDUR) 60 MG 24 hr tablet Take 1 tablet (60 mg total) by mouth daily. 30 tablet 6   latanoprost (XALATAN) 0.005 % ophthalmic solution SMARTSIG:In Eye(s)     lisinopril (ZESTRIL) 10 MG tablet Take 10 mg by mouth every evening.      memantine (NAMENDA) 10 MG tablet TAKE 1 TABLET BY MOUTH TWICE  DAILY 200 tablet 2   metFORMIN (GLUCOPHAGE) 1000 MG tablet Take 1,000 mg by mouth 2 (two) times daily with a meal.     metoprolol succinate (TOPROL-XL) 25 MG 24 hr tablet TAKE 1 TABLET BY MOUTH  DAILY 90 tablet 3   nitroGLYCERIN (NITROSTAT) 0.4 MG SL tablet Place 1 tablet (0.4 mg total) under the tongue every 5 (five) minutes x 3 doses as needed. 25 tablet 3   omeprazole (PRILOSEC) 20 MG capsule Take 1 capsule (20 mg total) by mouth 2 (two) times daily before a meal. 60 capsule 0   ONETOUCH VERIO test strip      sertraline (ZOLOFT) 100 MG tablet Take 100 mg by mouth daily.     simvastatin (ZOCOR) 40 MG tablet Take 40 mg by mouth at bedtime.     sucralfate (CARAFATE) 1 g tablet Take 1  tablet (1 g total) by mouth 4 (four) times daily -  with meals and at bedtime. 90 tablet 0   tamsulosin (FLOMAX) 0.4 MG CAPS capsule Take 0.4 mg by mouth daily.     zinc gluconate 50 MG tablet Take 50 mg by mouth daily.     No current facility-administered medications on file prior to visit.    Allergies:   Allergies  Allergen Reactions   Contrast Media [Iodinated Contrast Media]    Vioxx [Rofecoxib]     On pt MAR   Shellfish Allergy Itching and Rash   Sulfonamide Derivatives Itching and Rash    Physical Exam There were no vitals filed for this visit.  There is no height or weight on file to calculate BMI.   General: well developed, well nourished pleasant elderly Caucasian male, seated, in no evident distress Head: head normocephalic and atraumatic.   Neck: supple with no carotid or supraclavicular bruits Cardiovascular: regular rate and rhythm, no murmurs Musculoskeletal: no deformity Skin:  no rash/petichiae Vascular:  Normal pulses all extremities  Neurologic Exam Mental Status: Awake and fully alert. Oriented to place and time. Recent memory impaired and remote memory intact. Attention span, concentration and fund of knowledge impaired with wife providing majority of history. Mood and affect appropriate.      01/13/2021    1:10 PM 01/13/2020    1:30 PM 12/05/2018   11:41 AM  MMSE - Mini Mental State Exam  Orientation to time _0 Orientation to Place _1 Registration _2 Attention/ Calculation 0 1 0  Recall 1 0 2  Language- name 2 objects _3 Language- repeat 0 0 1  Language- follow 3 step command _4 Language- read & follow direction 1 0 1  Write a sentence 0 1 1  Copy design 0 0 0  Total score _5 Cranial Nerves: Pupils equal, briskly reactive to light. Extraocular movements full  without nystagmus. Visual fields full to confrontation. Hearing reduced significantly in the right ear.  Facial sensation intact. Face, tongue, palate moves  normally and symmetrically.  Motor: Normal bulk and tone. Normal strength in all tested extremity muscles. Sensory.: intact to touch , pinprick , position and vibratory sensation.  Coordination: Rapid alternating movements normal in all extremities. Finger-to-nose and heel-to-shin performed accurately bilaterally. Gait and Station: Arises from chair without difficulty. Stance is normal. Gait demonstrates normal stride length and balance with use of walking stick. Able to heel, toe and tandem walk with mild difficulty.  Reflexes: 1+ and symmetric. Toes downgoing.       ASSESSMENT/PLAN: 76 year old Caucasian male initially consulted 11/2018 with what appears to be mild baseline dementia with recent fall head injury and postconcussion worsening of his cognitive difficulties from Alzheimer's. Per patient and wife, he has been stable without worsening over the past year.  Dementia panel within normal limits.  EEG showed mild slowing consistent with memory loss and dementia without evidence of seizures.    -MMSE today 15/30 (prior 17/30) -Continue Namenda 10 mg twice daily -refill provided -Discussed potentially adding Aricept in additional to [place] but will hold off at this time as subjectively cognition has been stable- advised to call in the future with any concerns or worsening dementia -Continue to follow with PCP and cardiology as scheduled for monitoring management of HTN, HLD, DM and atrial fibrillation   Follow-up in 1 year or call earlier if needed   I spent 31 minutes of face-to-face and non-face-to-face time with patient and wife.  This included previsit chart review, lab review, study review, order entry, electronic health record documentation, patient education and discussion regarding Alzheimer's dementia, completion and review of MMSE, ongoing use of Namenda and potential use of Aricept and answered all questions to patient and wife satisfaction  Frann Rider, AGNP-BC  [facility]  [facility] [address] [place], [place] [address]  Phone [phone] Fax [phone] Note: This document was prepared with digital dictation and possible smart phrase technology. Any transcriptional errors that result from this process are unintentional.

## 2022-01-13 ENCOUNTER — Encounter: Payer: Self-pay | Admitting: Adult Health

## 2022-01-13 ENCOUNTER — Ambulatory Visit (INDEPENDENT_AMBULATORY_CARE_PROVIDER_SITE_OTHER): Payer: Medicare Other | Admitting: Adult Health

## 2022-01-13 VITALS — BP 144/82 | HR 64 | Ht 72.0 in | Wt 191.0 lb

## 2022-01-13 DIAGNOSIS — F0280 Dementia in other diseases classified elsewhere without behavioral disturbance: Secondary | ICD-10-CM

## 2022-01-13 DIAGNOSIS — G301 Alzheimer's disease with late onset: Secondary | ICD-10-CM

## 2022-01-13 MED ORDER — DONEPEZIL HCL 10 MG PO TABS: 10.0000 mg | ORAL_TABLET | Freq: Every day | ORAL | 11 refills | Status: DC

## 2022-01-13 NOTE — Patient Instructions (Signed)
Your Plan:  Continue Namenda '10mg'$  twice daily  Start Aricept '10mg'$  nightly - - if you would like me to send electronically to a pharmacy, please call office with pharmacy name and location    Follow up in 5 months or call earlier if needed      Thank you for coming to see [place] at [facility]. I hope we have been able to provide you high quality care today.  You may receive a patient satisfaction survey over the next few weeks. We would appreciate your feedback and comments so that we may continue to improve ourselves and the health of our patients.

## 2022-01-14 ENCOUNTER — Ambulatory Visit (INDEPENDENT_AMBULATORY_CARE_PROVIDER_SITE_OTHER): Payer: Medicare Other

## 2022-01-14 DIAGNOSIS — I442 Atrioventricular block, complete: Secondary | ICD-10-CM

## 2022-01-14 DIAGNOSIS — Z950 Presence of cardiac pacemaker: Secondary | ICD-10-CM

## 2022-01-14 LAB — CUP PACEART REMOTE DEVICE CHECK
Battery Remaining Longevity: 43 mo
Battery Remaining Percentage: 32 %
Battery Voltage: 2.96 V
Brady Statistic RV Percent Paced: 99 %
Date Time Interrogation Session: 20231102172952
Implantable Lead Connection Status: 753985
Implantable Lead Implant Date: 20020215
Implantable Lead Location: 753860
Implantable Pulse Generator Implant Date: 20141121
Lead Channel Impedance Value: 560 Ohm
Lead Channel Pacing Threshold Amplitude: 1 V
Lead Channel Pacing Threshold Pulse Width: 0.4 ms
Lead Channel Sensing Intrinsic Amplitude: 12 mV
Lead Channel Setting Pacing Amplitude: 1.25 V
Lead Channel Setting Pacing Pulse Width: 0.4 ms
Lead Channel Setting Sensing Sensitivity: 6 mV
Pulse Gen Model: 1240
Pulse Gen Serial Number: 7488472

## 2022-01-18 ENCOUNTER — Inpatient Hospital Stay: Payer: Medicare Other | Attending: Hematology

## 2022-01-18 VITALS — BP 152/66 | HR 63 | Temp 97.4°F | Resp 16

## 2022-01-18 DIAGNOSIS — D696 Thrombocytopenia, unspecified: Secondary | ICD-10-CM | POA: Diagnosis not present

## 2022-01-18 DIAGNOSIS — D509 Iron deficiency anemia, unspecified: Secondary | ICD-10-CM | POA: Insufficient documentation

## 2022-01-18 DIAGNOSIS — Z79899 Other long term (current) drug therapy: Secondary | ICD-10-CM | POA: Insufficient documentation

## 2022-01-18 DIAGNOSIS — D472 Monoclonal gammopathy: Secondary | ICD-10-CM | POA: Insufficient documentation

## 2022-01-18 MED ORDER — LORATADINE 10 MG PO TABS
10.0000 mg | ORAL_TABLET | Freq: Once | ORAL | Status: AC
  Administered 2022-01-18: 10 mg via ORAL
  Filled 2022-01-18: qty 1

## 2022-01-18 MED ORDER — SODIUM CHLORIDE 0.9 % IV SOLN: Freq: Once | INTRAVENOUS | Status: AC

## 2022-01-18 MED ORDER — SODIUM CHLORIDE 0.9 % IV SOLN
510.0000 mg | Freq: Once | INTRAVENOUS | Status: AC
  Administered 2022-01-18: 510 mg via INTRAVENOUS
  Filled 2022-01-18: qty 510

## 2022-01-18 MED ORDER — ACETAMINOPHEN 325 MG PO TABS
650.0000 mg | ORAL_TABLET | Freq: Once | ORAL | Status: AC
  Administered 2022-01-18: 650 mg via ORAL
  Filled 2022-01-18: qty 2

## 2022-01-18 NOTE — Progress Notes (Signed)
Patient tolerated iron infusion with no complaints voiced.  Peripheral IV site clean and dry with good blood return noted before and after infusion.  Band aid applied.  VSS with discharge and left in satisfactory condition with no s/s of distress noted.   

## 2022-01-18 NOTE — Patient Instructions (Signed)
[facility]  Discharge Instructions: Thank you for choosing [facility] to provide your oncology and hematology care.  If you have a lab appointment with the Cancer Center, please come in thru the Main Entrance and check in at the main information desk.  Wear comfortable clothing and clothing appropriate for easy access to any Portacath or PICC line.   We strive to give you quality time with your provider. You may need to reschedule your appointment if you arrive late (15 or more minutes).  Arriving late affects you and other patients whose appointments are after yours.  Also, if you miss three or more appointments without notifying the office, you may be dismissed from the clinic at the provider's discretion.      For prescription refill requests, have your pharmacy contact our office and allow 72 hours for refills to be completed.    Today you received the following [place], return as scheduled.   To help prevent nausea and vomiting after your treatment, we encourage you to take your nausea medication as directed.  BELOW ARE SYMPTOMS THAT SHOULD BE REPORTED IMMEDIATELY: *FEVER GREATER THAN 100.4 F (38 C) OR HIGHER *CHILLS OR SWEATING *NAUSEA AND VOMITING THAT IS NOT CONTROLLED WITH YOUR NAUSEA MEDICATION *UNUSUAL SHORTNESS OF BREATH *UNUSUAL BRUISING OR BLEEDING *URINARY PROBLEMS (pain or burning when urinating, or frequent urination) *BOWEL PROBLEMS (unusual diarrhea, constipation, pain near the anus) TENDERNESS IN MOUTH AND THROAT WITH OR WITHOUT PRESENCE OF ULCERS (sore throat, sores in mouth, or a toothache) UNUSUAL RASH, SWELLING OR PAIN  UNUSUAL VAGINAL DISCHARGE OR ITCHING   Items with * indicate a potential emergency and should be followed up as soon as possible or go to the Emergency Department if any problems should occur.  Please show the CHEMOTHERAPY ALERT CARD or IMMUNOTHERAPY ALERT CARD at check-in to the Emergency Department and  triage nurse.  Should you have questions after your visit or need to cancel or reschedule your appointment, please contact [facility] [phone]  and follow the prompts.  Office hours are 8:00 a.m. to 4:30 p.m. Monday - Friday. Please note that voicemails left after 4:00 p.m. may not be returned until the following business day.  We are closed weekends and major holidays. You have access to a nurse at all times for urgent questions. Please call the main number to the clinic [phone] and follow the prompts.  For any non-urgent questions, you may also contact your provider using MyChart. We now offer e-Visits for anyone 18 and older to request care online for non-urgent symptoms. For details visit [email or web address].   Also download the MyChart app! Go to the app store, search "MyChart", open the app, select [facility], and log in with your MyChart username and password.  Masks are optional in the cancer centers. If you would like for your care team to wear a mask while they are taking care of you, please let them know. You may have one support person who is at least 78 years old accompany you for your appointments.  

## 2022-01-19 DIAGNOSIS — G301 Alzheimer's disease with late onset: Secondary | ICD-10-CM | POA: Diagnosis not present

## 2022-01-19 DIAGNOSIS — F02B0 Dementia in other diseases classified elsewhere, moderate, without behavioral disturbance, psychotic disturbance, mood disturbance, and anxiety: Secondary | ICD-10-CM | POA: Diagnosis not present

## 2022-01-24 NOTE — Progress Notes (Signed)
Remote pacemaker transmission.   

## 2022-01-25 ENCOUNTER — Inpatient Hospital Stay: Payer: Medicare Other

## 2022-01-25 VITALS — BP 160/76 | HR 63 | Temp 97.8°F | Resp 18

## 2022-01-25 DIAGNOSIS — I5042 Chronic combined systolic (congestive) and diastolic (congestive) heart failure: Secondary | ICD-10-CM | POA: Diagnosis not present

## 2022-01-25 DIAGNOSIS — M1A9XX0 Chronic gout, unspecified, without tophus (tophi): Secondary | ICD-10-CM | POA: Diagnosis not present

## 2022-01-25 DIAGNOSIS — E559 Vitamin D deficiency, unspecified: Secondary | ICD-10-CM | POA: Diagnosis not present

## 2022-01-25 DIAGNOSIS — I7091 Generalized atherosclerosis: Secondary | ICD-10-CM | POA: Diagnosis not present

## 2022-01-25 DIAGNOSIS — D472 Monoclonal gammopathy: Secondary | ICD-10-CM | POA: Diagnosis not present

## 2022-01-25 DIAGNOSIS — R296 Repeated falls: Secondary | ICD-10-CM | POA: Diagnosis not present

## 2022-01-25 DIAGNOSIS — M25561 Pain in right knee: Secondary | ICD-10-CM | POA: Diagnosis not present

## 2022-01-25 DIAGNOSIS — E119 Type 2 diabetes mellitus without complications: Secondary | ICD-10-CM | POA: Diagnosis not present

## 2022-01-25 DIAGNOSIS — D649 Anemia, unspecified: Secondary | ICD-10-CM | POA: Diagnosis not present

## 2022-01-25 DIAGNOSIS — D509 Iron deficiency anemia, unspecified: Secondary | ICD-10-CM

## 2022-01-25 DIAGNOSIS — G301 Alzheimer's disease with late onset: Secondary | ICD-10-CM | POA: Diagnosis not present

## 2022-01-25 DIAGNOSIS — K219 Gastro-esophageal reflux disease without esophagitis: Secondary | ICD-10-CM | POA: Diagnosis not present

## 2022-01-25 DIAGNOSIS — D696 Thrombocytopenia, unspecified: Secondary | ICD-10-CM | POA: Diagnosis not present

## 2022-01-25 DIAGNOSIS — Z79899 Other long term (current) drug therapy: Secondary | ICD-10-CM | POA: Diagnosis not present

## 2022-01-25 MED ORDER — SODIUM CHLORIDE 0.9 % IV SOLN: Freq: Once | INTRAVENOUS | Status: AC

## 2022-01-25 MED ORDER — LORATADINE 10 MG PO TABS
10.0000 mg | ORAL_TABLET | Freq: Once | ORAL | Status: AC
  Administered 2022-01-25: 10 mg via ORAL
  Filled 2022-01-25: qty 1

## 2022-01-25 MED ORDER — ACETAMINOPHEN 325 MG PO TABS
650.0000 mg | ORAL_TABLET | Freq: Once | ORAL | Status: AC
  Administered 2022-01-25: 650 mg via ORAL
  Filled 2022-01-25: qty 2

## 2022-01-25 MED ORDER — SODIUM CHLORIDE 0.9 % IV SOLN
510.0000 mg | Freq: Once | INTRAVENOUS | Status: AC
  Administered 2022-01-25: 510 mg via INTRAVENOUS
  Filled 2022-01-25: qty 510

## 2022-01-25 NOTE — Patient Instructions (Signed)
[facility]  Discharge Instructions: Thank you for choosing [facility] to provide your oncology and hematology care.  If you have a lab appointment with the Cancer Center, please come in thru the Main Entrance and check in at the main information desk.  Wear comfortable clothing and clothing appropriate for easy access to any Portacath or PICC line.   We strive to give you quality time with your provider. You may need to reschedule your appointment if you arrive late (15 or more minutes).  Arriving late affects you and other patients whose appointments are after yours.  Also, if you miss three or more appointments without notifying the office, you may be dismissed from the clinic at the provider's discretion.      For prescription refill requests, have your pharmacy contact our office and allow 72 hours for refills to be completed.     To help prevent nausea and vomiting after your treatment, we encourage you to take your nausea medication as directed.  BELOW ARE SYMPTOMS THAT SHOULD BE REPORTED IMMEDIATELY: *FEVER GREATER THAN 100.4 F (38 C) OR HIGHER *CHILLS OR SWEATING *NAUSEA AND VOMITING THAT IS NOT CONTROLLED WITH YOUR NAUSEA MEDICATION *UNUSUAL SHORTNESS OF BREATH *UNUSUAL BRUISING OR BLEEDING *URINARY PROBLEMS (pain or burning when urinating, or frequent urination) *BOWEL PROBLEMS (unusual diarrhea, constipation, pain near the anus) TENDERNESS IN MOUTH AND THROAT WITH OR WITHOUT PRESENCE OF ULCERS (sore throat, sores in mouth, or a toothache) UNUSUAL RASH, SWELLING OR PAIN  UNUSUAL VAGINAL DISCHARGE OR ITCHING   Items with * indicate a potential emergency and should be followed up as soon as possible or go to the Emergency Department if any problems should occur.  Please show the CHEMOTHERAPY ALERT CARD or IMMUNOTHERAPY ALERT CARD at check-in to the Emergency Department and triage nurse.  Should you have questions after your visit or need to  cancel or reschedule your appointment, please contact [facility] [phone]  and follow the prompts.  Office hours are 8:00 a.m. to 4:30 p.m. Monday - Friday. Please note that voicemails left after 4:00 p.m. may not be returned until the following business day.  We are closed weekends and major holidays. You have access to a nurse at all times for urgent questions. Please call the main number to the clinic [phone] and follow the prompts.  For any non-urgent questions, you may also contact your provider using MyChart. We now offer e-Visits for anyone 18 and older to request care online for non-urgent symptoms. For details visit [email or web address].   Also download the MyChart app! Go to the app store, search "MyChart", open the app, select [facility], and log in with your MyChart username and password.  Masks are optional in the cancer centers. If you would like for your care team to wear a mask while they are taking care of you, please let them know. You may have one support person who is at least 78 years old accompany you for your appointments.  

## 2022-01-25 NOTE — Progress Notes (Signed)
Patient presents today for iron infusion.  Patient is in satisfactory condition with no complaints voiced.  Vital signs are stable.  We will proceed with treatment per provider orders.  Patient tolerated iron infusion well with no complaints voiced.  Patient left ambulatory with caregiver in stable condition.  Vital signs stable at discharge.  Follow up as scheduled.

## 2022-01-28 ENCOUNTER — Encounter (HOSPITAL_COMMUNITY): Payer: Self-pay

## 2022-01-28 ENCOUNTER — Emergency Department (HOSPITAL_COMMUNITY): Payer: Medicare Other

## 2022-01-28 ENCOUNTER — Other Ambulatory Visit: Payer: Self-pay

## 2022-01-28 ENCOUNTER — Emergency Department (HOSPITAL_COMMUNITY)
Admission: EM | Admit: 2022-01-28 | Discharge: 2022-01-28 | Disposition: A | Discharge status: Skilled Nursing Facility | Payer: Medicare Other | Attending: Emergency Medicine | Admitting: Emergency Medicine

## 2022-01-28 DIAGNOSIS — Z79899 Other long term (current) drug therapy: Secondary | ICD-10-CM | POA: Diagnosis not present

## 2022-01-28 DIAGNOSIS — M79645 Pain in left finger(s): Secondary | ICD-10-CM | POA: Diagnosis not present

## 2022-01-28 DIAGNOSIS — Z043 Encounter for examination and observation following other accident: Secondary | ICD-10-CM | POA: Diagnosis not present

## 2022-01-28 DIAGNOSIS — R410 Disorientation, unspecified: Secondary | ICD-10-CM | POA: Diagnosis not present

## 2022-01-28 DIAGNOSIS — Z743 Need for continuous supervision: Secondary | ICD-10-CM | POA: Diagnosis not present

## 2022-01-28 DIAGNOSIS — Z7984 Long term (current) use of oral hypoglycemic drugs: Secondary | ICD-10-CM | POA: Insufficient documentation

## 2022-01-28 DIAGNOSIS — W06XXXA Fall from bed, initial encounter: Secondary | ICD-10-CM | POA: Diagnosis not present

## 2022-01-28 DIAGNOSIS — S20211A Contusion of right front wall of thorax, initial encounter: Secondary | ICD-10-CM

## 2022-01-28 DIAGNOSIS — R6889 Other general symptoms and signs: Secondary | ICD-10-CM | POA: Diagnosis not present

## 2022-01-28 DIAGNOSIS — Z794 Long term (current) use of insulin: Secondary | ICD-10-CM | POA: Diagnosis not present

## 2022-01-28 DIAGNOSIS — R0781 Pleurodynia: Secondary | ICD-10-CM | POA: Insufficient documentation

## 2022-01-28 DIAGNOSIS — S62502A Fracture of unspecified phalanx of left thumb, initial encounter for closed fracture: Secondary | ICD-10-CM

## 2022-01-28 DIAGNOSIS — W19XXXA Unspecified fall, initial encounter: Secondary | ICD-10-CM

## 2022-01-28 DIAGNOSIS — S62525A Nondisplaced fracture of distal phalanx of left thumb, initial encounter for closed fracture: Secondary | ICD-10-CM | POA: Diagnosis not present

## 2022-01-28 DIAGNOSIS — S20212A Contusion of left front wall of thorax, initial encounter: Secondary | ICD-10-CM | POA: Diagnosis not present

## 2022-01-28 DIAGNOSIS — I2510 Atherosclerotic heart disease of native coronary artery without angina pectoris: Secondary | ICD-10-CM | POA: Diagnosis not present

## 2022-01-28 DIAGNOSIS — E119 Type 2 diabetes mellitus without complications: Secondary | ICD-10-CM | POA: Diagnosis not present

## 2022-01-28 DIAGNOSIS — Z7901 Long term (current) use of anticoagulants: Secondary | ICD-10-CM | POA: Diagnosis not present

## 2022-01-28 DIAGNOSIS — S60012A Contusion of left thumb without damage to nail, initial encounter: Secondary | ICD-10-CM | POA: Diagnosis not present

## 2022-01-28 DIAGNOSIS — Z950 Presence of cardiac pacemaker: Secondary | ICD-10-CM | POA: Diagnosis not present

## 2022-01-28 DIAGNOSIS — Z7401 Bed confinement status: Secondary | ICD-10-CM | POA: Diagnosis not present

## 2022-01-28 NOTE — ED Notes (Signed)
Report given to facility. Notified placed on EMS transport list.

## 2022-01-28 NOTE — Discharge Instructions (Signed)
Wear the thumb splint for the next 3 weeks to allow the fracture in your thumb to heal.  Return to the emergency department if you experience any new and/or concerning symptoms.

## 2022-01-28 NOTE — ED Provider Notes (Signed)
[facility] EMERGENCY DEPARTMENT Provider Note   CSN: [id number] Arrival date & time: 01/28/22  0148     History  Chief Complaint  Patient presents with   Jay Campbell is a 76 y.o. male.  Patient is a 76 year old male with past medical history of coronary artery disease, cardiac pacemaker, prior ablation, renal insufficiency, diabetes.  Patient sent from his extended care facility for evaluation of fall.  Patient rolled out of bed and onto the floor this evening.  He complains of pain to his right ribs and left thumb.  He denies head trauma or loss of consciousness.  He denies any neck pain or abdominal pain.    The history is provided by the patient.       Home Medications Prior to Admission medications   Medication Sig Start Date End Date Taking? Authorizing Provider  allopurinol (ZYLOPRIM) 100 MG tablet Take 100 mg by mouth Daily at 5am. 12/15/11   [provider]  BD INSULIN SYRINGE U/F 31G X 5/16" 0.3 ML MISC  06/26/19   [provider]  cholecalciferol (VITAMIN D3) 25 MCG (1000 UNIT) tablet Take 1,000 Units by mouth daily.    [provider]  colchicine 0.6 MG tablet Take 0.6 mg by mouth as needed.    [provider]  Continuous Blood Gluc Sensor (FREESTYLE LIBRE 14 DAY SENSOR) MISC  06/02/21   [provider]  diphenhydrAMINE-APAP, sleep, (TYLENOL PM EXTRA STRENGTH) 50-1000 MG/30ML LIQD Take 1,000 mLs by mouth daily.    [provider]  donepezil (ARICEPT) 10 MG tablet Take 1 tablet (10 mg total) by mouth at bedtime. 01/13/22   Frann Rider, NP  ELIQUIS 2.5 MG TABS tablet Take 2.5 mg by mouth 2 (two) times daily. 01/13/22   [provider]  ELIQUIS 5 MG TABS tablet Take 5 mg by mouth 2 (two) times daily. 10/18/19   [provider]  famotidine (PEPCID) 20 MG tablet Take 20 mg by mouth 2 (two) times daily. 07/05/19   [provider]  ferrous sulfate 325 (65 FE) MG EC tablet Take 325 mg by  mouth daily with breakfast.    [provider]  glipiZIDE (GLUCOTROL) 5 MG tablet Take 5 mg by mouth daily. 01/13/22   [provider]  HUMULIN 70/30 (70-30) 100 UNIT/ML injection Inject into the skin. 05/25/21   [provider]  hydrOXYzine (ATARAX) 10 MG tablet Take 10 mg by mouth 3 (three) times daily as needed for nausea.    [provider]  isosorbide mononitrate (IMDUR) 60 MG 24 hr tablet Take 1 tablet (60 mg total) by mouth daily. 04/17/20   Allred, Jeneen Rinks, MD  latanoprost (XALATAN) 0.005 % ophthalmic solution  02/23/21   [provider]  lisinopril (ZESTRIL) 10 MG tablet Take 10 mg by mouth every evening. 12/10/18   [provider]  meclizine (ANTIVERT) 25 MG tablet Take 25 mg by mouth 3 (three) times daily as needed for dizziness.    [provider]  memantine (NAMENDA) 10 MG tablet TAKE 1 TABLET BY MOUTH TWICE  DAILY 11/02/21   Frann Rider, NP  Menthol, Topical Analgesic, (BIOFREEZE) 4 % GEL Apply topically as needed.    [provider]  metFORMIN (GLUCOPHAGE) 1000 MG tablet Take 1,000 mg by mouth 2 (two) times daily with a meal.    [provider]  metoprolol succinate (TOPROL-XL) 25 MG 24 hr tablet TAKE 1 TABLET BY MOUTH  DAILY 07/09/21  Thompson Grayer, MD  nitroGLYCERIN (NITROSTAT) 0.4 MG SL tablet Place 1 tablet (0.4 mg total) under the tongue every 5 (five) minutes x 3 doses as needed. 04/15/16   Thompson Grayer, MD  [facility] VERIO test strip  08/17/17   [provider]  sertraline (ZOLOFT) 100 MG tablet Take 100 mg by mouth daily. 04/19/19   [provider]  simvastatin (ZOCOR) 40 MG tablet Take 40 mg by mouth at bedtime.    [provider]  tamsulosin (FLOMAX) 0.4 MG CAPS capsule Take 0.4 mg by mouth daily. 09/03/20   [provider]      Allergies    Contrast media [iodinated contrast media], Vioxx [rofecoxib], Shellfish allergy, and Sulfonamide derivatives    Review of  Systems   Review of Systems  All other systems reviewed and are negative.   Physical Exam Updated Vital Signs BP (!) 170/71   Pulse 64   Temp 98.4 F (36.9 C) (Oral)   Resp 19   Ht 6' (1.829 m)   Wt 86.6 kg   SpO2 95%   BMI 25.90 kg/m  Physical Exam Vitals and nursing note reviewed.  Constitutional:      General: He is not in acute distress.    Appearance: He is well-developed. He is not diaphoretic.  HENT:     Head: Normocephalic and atraumatic.  Cardiovascular:     Rate and Rhythm: Normal rate and regular rhythm.     Heart sounds: No murmur heard.    No friction rub.  Pulmonary:     Effort: Pulmonary effort is normal. No respiratory distress.     Breath sounds: Normal breath sounds. No wheezing or rales.     Comments: There is tenderness to palpation over the right lateral rib cage.  There is no palpable abnormality or crepitus.  Breath sounds are clear and equal. Abdominal:     General: Bowel sounds are normal. There is no distension.     Palpations: Abdomen is soft.     Tenderness: There is no abdominal tenderness.  Musculoskeletal:        General: Normal range of motion.     Cervical back: Normal range of motion and neck supple.     Comments: There is swelling and ecchymosis to the distal phalanx of the right thumb.  He has good range of motion.  Skin:    General: Skin is warm and dry.  Neurological:     Mental Status: He is alert and oriented to person, place, and time.     Coordination: Coordination normal.     ED Results / Procedures / Treatments   Labs (all labs ordered are listed, but only abnormal results are displayed) Labs Reviewed - No data to display  EKG None  Radiology DG Finger Thumb Left  Result Date: 01/28/2022 CLINICAL DATA:  Fall with injury EXAM: LEFT THUMB 3V COMPARISON:  None Available. FINDINGS: Nondisplaced linear fracture involving the base of the thumb distal phalanx, best seen on the lateral view. No dislocation. Pronounced  osteopenia. Degenerative spurring and joint space narrowing at the first MCP joint. IMPRESSION: Nondisplaced fracture involving the base of the distal phalanx. Electronically Signed   By: Jorje Guild M.D.   On: 01/28/2022 04:56   DG Ribs Unilateral W/Chest Right  Result Date: 01/28/2022 CLINICAL DATA:  Fall EXAM: RIGHT RIBS AND CHEST - 3+ VIEW COMPARISON:  09/03/2021 FINDINGS: No fracture or other bone lesions are seen involving the ribs. There is no evidence of pneumothorax or  pleural effusion. Both lungs are clear. Heart size and mediastinal contours are within normal limits. Unchanged position of left chest wall single lead pacemaker IMPRESSION: Negative. Electronically Signed   By: Ulyses Jarred M.D.   On: 01/28/2022 03:13    Procedures Procedures    Medications Ordered in ED Medications - No data to display  ED Course/ Medical Decision Making/ A&P  Patient presenting for evaluation of a fall that occurred at his extended care facility.  He reports rolling out of bed and injuring his right chest and left thumb.  Patient's vital signs are stable.  Physical examination reveals no overt signs of trauma with the exception of the swelling and ecchymosis to the left thumb.  X-rays of the thumb show a distal phalanx fracture.  This will be splinted.    He is tender over the right lateral ribs, however x-ray shows no fracture or no pneumothorax.  Patient seems appropriate for discharge.  There is no evidence for head injury.  His cervical spine is nontender and he has full range of motion and I do not believe there are indications for imaging these areas.  He also has good range of motion of both hips.  Final Clinical Impression(s) / ED Diagnoses Final diagnoses:  None    Rx / DC Orders ED Discharge Orders     None         Veryl Speak, MD 01/28/22 [phone]

## 2022-01-28 NOTE — ED Notes (Signed)
Pt to xr 

## 2022-01-28 NOTE — ED Triage Notes (Signed)
[facility] from [facility] . Cc of unwitnessed fall. Per ems, patient got out of the bed and landed on his right side. C/o right rib pain, states he heard it pop. Has hx of dementia. No loc.

## 2022-01-28 NOTE — ED Notes (Signed)
Pt remains in bed, calm and alert, NAD, pt continues to wait for transportation to his SNF

## 2022-01-28 NOTE — ED Notes (Signed)
Patient using urinal

## 2022-02-07 ENCOUNTER — Emergency Department (HOSPITAL_COMMUNITY)
Admission: EM | Admit: 2022-02-07 | Discharge: 2022-02-07 | Disposition: A | Discharge status: Home / Self Care | Payer: Medicare Other | Attending: Emergency Medicine | Admitting: Emergency Medicine

## 2022-02-07 ENCOUNTER — Other Ambulatory Visit: Payer: Self-pay

## 2022-02-07 ENCOUNTER — Encounter (HOSPITAL_COMMUNITY): Payer: Self-pay

## 2022-02-07 ENCOUNTER — Emergency Department (HOSPITAL_COMMUNITY): Payer: Medicare Other

## 2022-02-07 DIAGNOSIS — Z794 Long term (current) use of insulin: Secondary | ICD-10-CM | POA: Insufficient documentation

## 2022-02-07 DIAGNOSIS — W19XXXA Unspecified fall, initial encounter: Secondary | ICD-10-CM

## 2022-02-07 DIAGNOSIS — S0990XA Unspecified injury of head, initial encounter: Secondary | ICD-10-CM | POA: Insufficient documentation

## 2022-02-07 DIAGNOSIS — Z7901 Long term (current) use of anticoagulants: Secondary | ICD-10-CM | POA: Diagnosis not present

## 2022-02-07 DIAGNOSIS — Z7984 Long term (current) use of oral hypoglycemic drugs: Secondary | ICD-10-CM | POA: Insufficient documentation

## 2022-02-07 DIAGNOSIS — R279 Unspecified lack of coordination: Secondary | ICD-10-CM | POA: Diagnosis not present

## 2022-02-07 DIAGNOSIS — M545 Low back pain, unspecified: Secondary | ICD-10-CM | POA: Diagnosis not present

## 2022-02-07 DIAGNOSIS — F0390 Unspecified dementia without behavioral disturbance: Secondary | ICD-10-CM | POA: Insufficient documentation

## 2022-02-07 DIAGNOSIS — Z743 Need for continuous supervision: Secondary | ICD-10-CM | POA: Diagnosis not present

## 2022-02-07 DIAGNOSIS — T699XXA Effect of reduced temperature, unspecified, initial encounter: Secondary | ICD-10-CM | POA: Diagnosis not present

## 2022-02-07 DIAGNOSIS — Z7401 Bed confinement status: Secondary | ICD-10-CM | POA: Diagnosis not present

## 2022-02-07 DIAGNOSIS — W01198A Fall on same level from slipping, tripping and stumbling with subsequent striking against other object, initial encounter: Secondary | ICD-10-CM | POA: Diagnosis not present

## 2022-02-07 DIAGNOSIS — R6889 Other general symptoms and signs: Secondary | ICD-10-CM | POA: Diagnosis not present

## 2022-02-07 DIAGNOSIS — M549 Dorsalgia, unspecified: Secondary | ICD-10-CM | POA: Diagnosis not present

## 2022-02-07 NOTE — ED Provider Notes (Signed)
[facility] EMERGENCY DEPARTMENT Provider Note   CSN: [id number] Arrival date & time: 02/07/22  1928     History {Add pertinent medical, surgical, social history, OB history to HPI:1} Chief Complaint  Patient presents with   Jay Campbell is a 76 y.o. male presented to the hospital from [place] by EMS after unwitnessed fall.  Patient is on Coumadin.  He has some confusion or dementia at baseline, however he is able to tell me clearly on repeat questioning that he lost his balance as he was sitting down on his bed and then tumbled over onto the ground striking his head.  No loss of consciousness.  He denies pain anywhere else other than a mild headache.  HPI     Home Medications Prior to Admission medications   Medication Sig Start Date End Date Taking? Authorizing Provider  allopurinol (ZYLOPRIM) 100 MG tablet Take 100 mg by mouth Daily at 5am. 12/15/11   [provider]  BD INSULIN SYRINGE U/F 31G X 5/16" 0.3 ML MISC  06/26/19   [provider]  cholecalciferol (VITAMIN D3) 25 MCG (1000 UNIT) tablet Take 1,000 Units by mouth daily.    [provider]  colchicine 0.6 MG tablet Take 0.6 mg by mouth as needed.    [provider]  Continuous Blood Gluc Sensor (FREESTYLE LIBRE 14 DAY SENSOR) MISC  06/02/21   [provider]  diphenhydrAMINE-APAP, sleep, (TYLENOL PM EXTRA STRENGTH) 50-1000 MG/30ML LIQD Take 1,000 mLs by mouth daily.    [provider]  donepezil (ARICEPT) 10 MG tablet Take 1 tablet (10 mg total) by mouth at bedtime. 01/13/22   Frann Rider, NP  ELIQUIS 2.5 MG TABS tablet Take 2.5 mg by mouth 2 (two) times daily. 01/13/22   [provider]  ELIQUIS 5 MG TABS tablet Take 5 mg by mouth 2 (two) times daily. 10/18/19   [provider]  famotidine (PEPCID) 20 MG tablet Take 20 mg by mouth 2 (two) times daily. 07/05/19   [provider]  ferrous sulfate 325 (65 FE) MG EC  tablet Take 325 mg by mouth daily with breakfast.    [provider]  glipiZIDE (GLUCOTROL) 5 MG tablet Take 5 mg by mouth daily. 01/13/22   [provider]  HUMULIN 70/30 (70-30) 100 UNIT/ML injection Inject into the skin. 05/25/21   [provider]  hydrOXYzine (ATARAX) 10 MG tablet Take 10 mg by mouth 3 (three) times daily as needed for nausea.    [provider]  isosorbide mononitrate (IMDUR) 60 MG 24 hr tablet Take 1 tablet (60 mg total) by mouth daily. 04/17/20   Allred, Jeneen Rinks, MD  latanoprost (XALATAN) 0.005 % ophthalmic solution  02/23/21   [provider]  lisinopril (ZESTRIL) 10 MG tablet Take 10 mg by mouth every evening. 12/10/18   [provider]  meclizine (ANTIVERT) 25 MG tablet Take 25 mg by mouth 3 (three) times daily as needed for dizziness.    [provider]  memantine (NAMENDA) 10 MG tablet TAKE 1 TABLET BY MOUTH TWICE  DAILY 11/02/21   Frann Rider, NP  Menthol, Topical Analgesic, (BIOFREEZE) 4 % GEL Apply topically as needed.    [provider]  metFORMIN (GLUCOPHAGE) 1000 MG tablet Take 1,000 mg by mouth 2 (two) times daily with a meal.    [provider]  metoprolol succinate (TOPROL-XL) 25 MG 24 hr tablet TAKE 1 TABLET BY MOUTH  DAILY 07/09/21  Thompson Grayer, MD  nitroGLYCERIN (NITROSTAT) 0.4 MG SL tablet Place 1 tablet (0.4 mg total) under the tongue every 5 (five) minutes x 3 doses as needed. 04/15/16   Thompson Grayer, MD  [facility] VERIO test strip  08/17/17   [provider]  sertraline (ZOLOFT) 100 MG tablet Take 100 mg by mouth daily. 04/19/19   [provider]  simvastatin (ZOCOR) 40 MG tablet Take 40 mg by mouth at bedtime.    [provider]  tamsulosin (FLOMAX) 0.4 MG CAPS capsule Take 0.4 mg by mouth daily. 09/03/20   [provider]      Allergies    Contrast media [iodinated contrast media], Vioxx [rofecoxib], Shellfish allergy, and Sulfonamide  derivatives    Review of Systems   Review of Systems  Physical Exam Updated Vital Signs BP (!) 141/72 (BP Location: Right Arm)   Pulse 60   Temp 98.6 F (37 C) (Oral)   Resp 20   SpO2 97%  Physical Exam Constitutional:      General: He is not in acute distress. HENT:     Head: Normocephalic and atraumatic.  Eyes:     Conjunctiva/sclera: Conjunctivae normal.     Pupils: Pupils are equal, round, and reactive to light.  Cardiovascular:     Rate and Rhythm: Normal rate and regular rhythm.  Pulmonary:     Effort: Pulmonary effort is normal. No respiratory distress.  Abdominal:     General: There is no distension.     Tenderness: There is no abdominal tenderness.  Skin:    General: Skin is warm and dry.  Neurological:     General: No focal deficit present.     Mental Status: He is alert. Mental status is at baseline.     ED Results / Procedures / Treatments   Labs (all labs ordered are listed, but only abnormal results are displayed) Labs Reviewed - No data to display  EKG None  Radiology No results found.  Procedures Procedures  {Document cardiac monitor, telemetry assessment procedure when appropriate:1}  Medications Ordered in ED Medications - No data to display  ED Course/ Medical Decision Making/ A&P                           Medical Decision Making Amount and/or Complexity of Data Reviewed Radiology: ordered.   Patient is here with a mechanical fall at his living facility.  Reported isolated injury of the head.  Do not see evidence of head trauma or basilar skull fracture.  No other injuries noted on exam.  Full range of motion bilaterally at the hips without pelvic instability.  CT scan of the head was ordered and personally viewed interpreted, showing ***  I do not see any other indication for additional trauma imaging at this time.  Supplemental history is provided by EMS.  Patient is level 5 caveat due to dementia.  {Document critical care time  when appropriate:1} {Document review of labs and clinical decision tools ie heart score, Chads2Vasc2 etc:1}  {Document your independent review of radiology images, and any outside records:1} {Document your discussion with family members, caretakers, and with consultants:1} {Document social determinants of health affecting pt's care:1} {Document your decision making why or why not admission, treatments were needed:1} Final Clinical Impression(s) / ED Diagnoses Final diagnoses:  None    Rx / DC Orders ED Discharge Orders     None

## 2022-02-07 NOTE — ED Notes (Signed)
Patient transported to CT 

## 2022-02-07 NOTE — ED Notes (Signed)
Notified [place] [facility] of patient needing transportation back to [facility] [place].  

## 2022-02-07 NOTE — Discharge Instructions (Signed)
There was no brain injury or bleed noted on the CT scan.  Please follow-up with the facility provider if there are any other concerns

## 2022-02-07 NOTE — ED Notes (Signed)
Pt ambulated to restroom with 1 assist. Jay Campbell

## 2022-02-07 NOTE — ED Triage Notes (Signed)
Pt arrived from [facility] via [facility] sp unwitnessed fall on blood thinners. Confused at baseline

## 2022-02-10 DIAGNOSIS — D649 Anemia, unspecified: Secondary | ICD-10-CM | POA: Diagnosis not present

## 2022-02-11 DIAGNOSIS — G301 Alzheimer's disease with late onset: Secondary | ICD-10-CM | POA: Diagnosis not present

## 2022-02-11 DIAGNOSIS — F02B0 Dementia in other diseases classified elsewhere, moderate, without behavioral disturbance, psychotic disturbance, mood disturbance, and anxiety: Secondary | ICD-10-CM | POA: Diagnosis not present

## 2022-02-14 DIAGNOSIS — H401222 Low-tension glaucoma, left eye, moderate stage: Secondary | ICD-10-CM | POA: Diagnosis not present

## 2022-02-15 DIAGNOSIS — L84 Corns and callosities: Secondary | ICD-10-CM | POA: Diagnosis not present

## 2022-02-15 DIAGNOSIS — E119 Type 2 diabetes mellitus without complications: Secondary | ICD-10-CM | POA: Diagnosis not present

## 2022-02-15 DIAGNOSIS — M1A9XX0 Chronic gout, unspecified, without tophus (tophi): Secondary | ICD-10-CM | POA: Diagnosis not present

## 2022-02-15 DIAGNOSIS — E559 Vitamin D deficiency, unspecified: Secondary | ICD-10-CM | POA: Diagnosis not present

## 2022-02-15 DIAGNOSIS — M25561 Pain in right knee: Secondary | ICD-10-CM | POA: Diagnosis not present

## 2022-02-15 DIAGNOSIS — K219 Gastro-esophageal reflux disease without esophagitis: Secondary | ICD-10-CM | POA: Diagnosis not present

## 2022-02-15 DIAGNOSIS — E1051 Type 1 diabetes mellitus with diabetic peripheral angiopathy without gangrene: Secondary | ICD-10-CM | POA: Diagnosis not present

## 2022-02-15 DIAGNOSIS — I5042 Chronic combined systolic (congestive) and diastolic (congestive) heart failure: Secondary | ICD-10-CM | POA: Diagnosis not present

## 2022-02-15 DIAGNOSIS — G301 Alzheimer's disease with late onset: Secondary | ICD-10-CM | POA: Diagnosis not present

## 2022-02-15 DIAGNOSIS — I7091 Generalized atherosclerosis: Secondary | ICD-10-CM | POA: Diagnosis not present

## 2022-02-15 DIAGNOSIS — D649 Anemia, unspecified: Secondary | ICD-10-CM | POA: Diagnosis not present

## 2022-02-24 DIAGNOSIS — N390 Urinary tract infection, site not specified: Secondary | ICD-10-CM | POA: Diagnosis not present

## 2022-03-10 DIAGNOSIS — D649 Anemia, unspecified: Secondary | ICD-10-CM | POA: Diagnosis not present

## 2022-03-15 DIAGNOSIS — I5042 Chronic combined systolic (congestive) and diastolic (congestive) heart failure: Secondary | ICD-10-CM | POA: Diagnosis not present

## 2022-03-15 DIAGNOSIS — K219 Gastro-esophageal reflux disease without esophagitis: Secondary | ICD-10-CM | POA: Diagnosis not present

## 2022-03-15 DIAGNOSIS — I7091 Generalized atherosclerosis: Secondary | ICD-10-CM | POA: Diagnosis not present

## 2022-03-15 DIAGNOSIS — M1A9XX0 Chronic gout, unspecified, without tophus (tophi): Secondary | ICD-10-CM | POA: Diagnosis not present

## 2022-03-15 DIAGNOSIS — E559 Vitamin D deficiency, unspecified: Secondary | ICD-10-CM | POA: Diagnosis not present

## 2022-03-15 DIAGNOSIS — M25561 Pain in right knee: Secondary | ICD-10-CM | POA: Diagnosis not present

## 2022-03-15 DIAGNOSIS — G301 Alzheimer's disease with late onset: Secondary | ICD-10-CM | POA: Diagnosis not present

## 2022-03-15 DIAGNOSIS — D649 Anemia, unspecified: Secondary | ICD-10-CM | POA: Diagnosis not present

## 2022-03-15 DIAGNOSIS — E119 Type 2 diabetes mellitus without complications: Secondary | ICD-10-CM | POA: Diagnosis not present

## 2022-03-16 DIAGNOSIS — G301 Alzheimer's disease with late onset: Secondary | ICD-10-CM | POA: Diagnosis not present

## 2022-03-16 DIAGNOSIS — F02B0 Dementia in other diseases classified elsewhere, moderate, without behavioral disturbance, psychotic disturbance, mood disturbance, and anxiety: Secondary | ICD-10-CM | POA: Diagnosis not present

## 2022-03-18 DIAGNOSIS — Z79899 Other long term (current) drug therapy: Secondary | ICD-10-CM | POA: Diagnosis not present

## 2022-03-25 DIAGNOSIS — M1A9XX0 Chronic gout, unspecified, without tophus (tophi): Secondary | ICD-10-CM | POA: Diagnosis not present

## 2022-03-25 DIAGNOSIS — K219 Gastro-esophageal reflux disease without esophagitis: Secondary | ICD-10-CM | POA: Diagnosis not present

## 2022-03-25 DIAGNOSIS — E119 Type 2 diabetes mellitus without complications: Secondary | ICD-10-CM | POA: Diagnosis not present

## 2022-03-25 DIAGNOSIS — E7849 Other hyperlipidemia: Secondary | ICD-10-CM | POA: Diagnosis not present

## 2022-03-25 DIAGNOSIS — I5042 Chronic combined systolic (congestive) and diastolic (congestive) heart failure: Secondary | ICD-10-CM | POA: Diagnosis not present

## 2022-04-12 DIAGNOSIS — E559 Vitamin D deficiency, unspecified: Secondary | ICD-10-CM | POA: Diagnosis not present

## 2022-04-12 DIAGNOSIS — E119 Type 2 diabetes mellitus without complications: Secondary | ICD-10-CM | POA: Diagnosis not present

## 2022-04-12 DIAGNOSIS — I7091 Generalized atherosclerosis: Secondary | ICD-10-CM | POA: Diagnosis not present

## 2022-04-12 DIAGNOSIS — E7849 Other hyperlipidemia: Secondary | ICD-10-CM | POA: Diagnosis not present

## 2022-04-12 DIAGNOSIS — M25561 Pain in right knee: Secondary | ICD-10-CM | POA: Diagnosis not present

## 2022-04-12 DIAGNOSIS — D649 Anemia, unspecified: Secondary | ICD-10-CM | POA: Diagnosis not present

## 2022-04-12 DIAGNOSIS — I5042 Chronic combined systolic (congestive) and diastolic (congestive) heart failure: Secondary | ICD-10-CM | POA: Diagnosis not present

## 2022-04-12 DIAGNOSIS — M1A9XX0 Chronic gout, unspecified, without tophus (tophi): Secondary | ICD-10-CM | POA: Diagnosis not present

## 2022-04-12 DIAGNOSIS — K219 Gastro-esophageal reflux disease without esophagitis: Secondary | ICD-10-CM | POA: Diagnosis not present

## 2022-04-15 ENCOUNTER — Ambulatory Visit: Payer: Medicare Other

## 2022-04-15 DIAGNOSIS — I442 Atrioventricular block, complete: Secondary | ICD-10-CM | POA: Diagnosis not present

## 2022-04-15 LAB — CUP PACEART REMOTE DEVICE CHECK
Battery Remaining Longevity: 40 mo
Battery Remaining Percentage: 30 %
Battery Voltage: 2.96 V
Brady Statistic RV Percent Paced: 99 %
Date Time Interrogation Session: 20240202040029
Implantable Lead Connection Status: 753985
Implantable Lead Implant Date: 20020215
Implantable Lead Location: 753860
Implantable Pulse Generator Implant Date: 20141121
Lead Channel Impedance Value: 600 Ohm
Lead Channel Pacing Threshold Amplitude: 1.625 V
Lead Channel Pacing Threshold Pulse Width: 0.4 ms
Lead Channel Sensing Intrinsic Amplitude: 12 mV
Lead Channel Setting Pacing Amplitude: 1.875
Lead Channel Setting Pacing Pulse Width: 0.4 ms
Lead Channel Setting Sensing Sensitivity: 6 mV
Pulse Gen Model: 1240
Pulse Gen Serial Number: 7488472

## 2022-04-20 DIAGNOSIS — F02B0 Dementia in other diseases classified elsewhere, moderate, without behavioral disturbance, psychotic disturbance, mood disturbance, and anxiety: Secondary | ICD-10-CM | POA: Diagnosis not present

## 2022-04-20 DIAGNOSIS — G301 Alzheimer's disease with late onset: Secondary | ICD-10-CM | POA: Diagnosis not present

## 2022-04-23 ENCOUNTER — Other Ambulatory Visit: Payer: Self-pay

## 2022-04-23 ENCOUNTER — Emergency Department (HOSPITAL_COMMUNITY): Payer: Medicare Other

## 2022-04-23 ENCOUNTER — Emergency Department (HOSPITAL_COMMUNITY)
Admission: EM | Admit: 2022-04-23 | Discharge: 2022-04-23 | Disposition: A | Discharge status: Skilled Nursing Facility | Payer: Medicare Other | Attending: Emergency Medicine | Admitting: Emergency Medicine

## 2022-04-23 DIAGNOSIS — N400 Enlarged prostate without lower urinary tract symptoms: Secondary | ICD-10-CM | POA: Diagnosis not present

## 2022-04-23 DIAGNOSIS — Z7984 Long term (current) use of oral hypoglycemic drugs: Secondary | ICD-10-CM | POA: Insufficient documentation

## 2022-04-23 DIAGNOSIS — S3991XA Unspecified injury of abdomen, initial encounter: Secondary | ICD-10-CM | POA: Diagnosis not present

## 2022-04-23 DIAGNOSIS — I2510 Atherosclerotic heart disease of native coronary artery without angina pectoris: Secondary | ICD-10-CM | POA: Diagnosis not present

## 2022-04-23 DIAGNOSIS — N200 Calculus of kidney: Secondary | ICD-10-CM | POA: Insufficient documentation

## 2022-04-23 DIAGNOSIS — W06XXXA Fall from bed, initial encounter: Secondary | ICD-10-CM | POA: Insufficient documentation

## 2022-04-23 DIAGNOSIS — R109 Unspecified abdominal pain: Secondary | ICD-10-CM | POA: Diagnosis not present

## 2022-04-23 DIAGNOSIS — K5730 Diverticulosis of large intestine without perforation or abscess without bleeding: Secondary | ICD-10-CM | POA: Insufficient documentation

## 2022-04-23 DIAGNOSIS — K766 Portal hypertension: Secondary | ICD-10-CM | POA: Diagnosis not present

## 2022-04-23 DIAGNOSIS — Z79899 Other long term (current) drug therapy: Secondary | ICD-10-CM | POA: Diagnosis not present

## 2022-04-23 DIAGNOSIS — Z7901 Long term (current) use of anticoagulants: Secondary | ICD-10-CM | POA: Insufficient documentation

## 2022-04-23 DIAGNOSIS — S299XXA Unspecified injury of thorax, initial encounter: Secondary | ICD-10-CM | POA: Insufficient documentation

## 2022-04-23 DIAGNOSIS — K8020 Calculus of gallbladder without cholecystitis without obstruction: Secondary | ICD-10-CM | POA: Diagnosis not present

## 2022-04-23 DIAGNOSIS — R6889 Other general symptoms and signs: Secondary | ICD-10-CM | POA: Diagnosis not present

## 2022-04-23 DIAGNOSIS — I700 Atherosclerosis of aorta: Secondary | ICD-10-CM | POA: Insufficient documentation

## 2022-04-23 DIAGNOSIS — S298XXA Other specified injuries of thorax, initial encounter: Secondary | ICD-10-CM

## 2022-04-23 DIAGNOSIS — Z794 Long term (current) use of insulin: Secondary | ICD-10-CM | POA: Insufficient documentation

## 2022-04-23 DIAGNOSIS — W19XXXA Unspecified fall, initial encounter: Secondary | ICD-10-CM | POA: Diagnosis not present

## 2022-04-23 DIAGNOSIS — G8911 Acute pain due to trauma: Secondary | ICD-10-CM | POA: Diagnosis not present

## 2022-04-23 DIAGNOSIS — I10 Essential (primary) hypertension: Secondary | ICD-10-CM | POA: Insufficient documentation

## 2022-04-23 DIAGNOSIS — G309 Alzheimer's disease, unspecified: Secondary | ICD-10-CM | POA: Diagnosis not present

## 2022-04-23 DIAGNOSIS — M4856XA Collapsed vertebra, not elsewhere classified, lumbar region, initial encounter for fracture: Secondary | ICD-10-CM | POA: Diagnosis not present

## 2022-04-23 DIAGNOSIS — E119 Type 2 diabetes mellitus without complications: Secondary | ICD-10-CM | POA: Insufficient documentation

## 2022-04-23 DIAGNOSIS — R079 Chest pain, unspecified: Secondary | ICD-10-CM | POA: Diagnosis not present

## 2022-04-23 DIAGNOSIS — K7460 Unspecified cirrhosis of liver: Secondary | ICD-10-CM | POA: Diagnosis not present

## 2022-04-23 DIAGNOSIS — Z743 Need for continuous supervision: Secondary | ICD-10-CM | POA: Diagnosis not present

## 2022-04-23 LAB — URINALYSIS, ROUTINE W REFLEX MICROSCOPIC
Bacteria, UA: NONE SEEN
Bilirubin Urine: NEGATIVE
Glucose, UA: NEGATIVE mg/dL
Hgb urine dipstick: NEGATIVE
Ketones, ur: NEGATIVE mg/dL
Leukocytes,Ua: NEGATIVE
Nitrite: NEGATIVE
Protein, ur: 100 mg/dL — AB
Specific Gravity, Urine: 1.015 (ref 1.005–1.030)
pH: 7 (ref 5.0–8.0)

## 2022-04-23 LAB — COMPREHENSIVE METABOLIC PANEL
ALT: 20 U/L (ref 0–44)
AST: 23 U/L (ref 15–41)
Albumin: 4 g/dL (ref 3.5–5.0)
Alkaline Phosphatase: 99 U/L (ref 38–126)
Anion gap: 10 (ref 5–15)
BUN: 28 mg/dL — ABNORMAL HIGH (ref 8–23)
CO2: 23 mmol/L (ref 22–32)
Calcium: 9 mg/dL (ref 8.9–10.3)
Chloride: 105 mmol/L (ref 98–111)
Creatinine, Ser: 1.25 mg/dL — ABNORMAL HIGH (ref 0.61–1.24)
GFR, Estimated: 59 mL/min — ABNORMAL LOW (ref >60)
Glucose, Bld: 144 mg/dL — ABNORMAL HIGH (ref 70–99)
Potassium: 4.9 mmol/L (ref 3.5–5.1)
Sodium: 138 mmol/L (ref 135–145)
Total Bilirubin: 1 mg/dL (ref 0.3–1.2)
Total Protein: 7.8 g/dL (ref 6.5–8.1)

## 2022-04-23 LAB — CBC
HCT: 34.9 % — ABNORMAL LOW (ref 39.0–52.0)
Hemoglobin: 11.9 g/dL — ABNORMAL LOW (ref 13.0–17.0)
MCH: 36.4 pg — ABNORMAL HIGH (ref 26.0–34.0)
MCHC: 34.1 g/dL (ref 30.0–36.0)
MCV: 106.7 fL — ABNORMAL HIGH (ref 80.0–100.0)
Platelets: 81 10*3/uL — ABNORMAL LOW (ref 150–400)
RBC: 3.27 MIL/uL — ABNORMAL LOW (ref 4.22–5.81)
RDW: 13.9 % (ref 11.5–15.5)
WBC: 3.2 10*3/uL — ABNORMAL LOW (ref 4.0–10.5)
nRBC: 0 % (ref 0.0–0.2)

## 2022-04-23 NOTE — ED Triage Notes (Signed)
Pt arrived [facility] from [facility] with c/o right flank pain. Pt fell out of bed 1-2am Friday morning. Pt now c/o pain. Pt has hx of dementia.

## 2022-04-23 NOTE — ED Provider Notes (Signed)
[facility] Provider Note   CSN: [id number] Arrival date & time: 04/23/22  [id number]     History  Chief Complaint  Patient presents with   Flank Pain    Jay Campbell is a 77 y.o. male.   Flank Pain  Patient presents after fall.  History of dementia be reportedly fell around 2 days ago.  Had not been complaining of any pain at the time but now complaining of potentially right-sided chest pain but also some left-sided pain.  At mental baseline per son.  Is on anticoagulation for atrial fibrillation.  Had been not having complaints up until this morning.    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 Authorizing Provider  allopurinol (ZYLOPRIM) 100 MG tablet Take 100 mg by mouth Daily at 5am. 12/15/11   [provider]  BD INSULIN SYRINGE U/F 31G X 5/16" 0.3 ML MISC  06/26/19   [provider]  cholecalciferol (VITAMIN D3) 25 MCG (1000 UNIT) tablet Take 1,000 Units by mouth daily.    [provider]  colchicine 0.6 MG tablet Take 0.6 mg by mouth as needed.    [provider]  Continuous Blood Gluc Sensor (FREESTYLE LIBRE 14 DAY SENSOR) MISC  06/02/21   [provider]  diphenhydrAMINE-APAP, sleep, (TYLENOL PM EXTRA STRENGTH) 50-1000 MG/30ML LIQD Take 1,000 mLs by mouth daily.    [provider]  donepezil (ARICEPT) 10 MG tablet Take 1 tablet  (10 mg total) by mouth at bedtime. 01/13/22   Frann Rider, NP  ELIQUIS 2.5 MG TABS tablet Take 2.5 mg by mouth 2 (two) times daily. 01/13/22   [provider]  ELIQUIS 5 MG TABS tablet Take 5 mg by mouth 2 (two) times daily. 10/18/19   [provider]  famotidine (PEPCID) 20 MG tablet Take 20 mg by mouth 2 (two) times daily. 07/05/19   [provider]  ferrous sulfate 325 (65 FE) MG EC tablet Take 325 mg by mouth daily with breakfast.    [provider]  glipiZIDE (GLUCOTROL) 5 MG tablet Take 5 mg by mouth daily. 01/13/22   [provider]  HUMULIN 70/30 (70-30) 100 UNIT/ML injection Inject into the skin. 05/25/21   [provider]  hydrOXYzine (ATARAX) 10 MG tablet Take 10 mg by mouth 3 (three) times daily as needed for nausea.    [provider]  isosorbide mononitrate (IMDUR) 60 MG 24 hr tablet Take 1 tablet (60 mg total) by mouth daily. 04/17/20   Allred, Jeneen Rinks, MD  latanoprost (XALATAN) 0.005 % ophthalmic solution  02/23/21   [provider]  lisinopril (ZESTRIL) 10 MG tablet Take 10 mg by mouth every evening. 12/10/18   [provider]  meclizine (ANTIVERT) 25 MG tablet Take 25 mg by mouth 3 (three) times daily  as needed for dizziness.    [provider]  memantine (NAMENDA) 10 MG tablet TAKE 1 TABLET BY MOUTH TWICE  DAILY 11/02/21   Frann Rider, NP  Menthol, Topical Analgesic, (BIOFREEZE) 4 % GEL Apply topically as needed.    [provider]  metFORMIN (GLUCOPHAGE) 1000 MG tablet Take 1,000 mg by mouth 2 (two) times daily with a meal.    [provider]  metoprolol succinate (TOPROL-XL) 25 MG 24 hr tablet TAKE 1 TABLET BY MOUTH  DAILY 07/09/21   Allred, Jeneen Rinks, MD  nitroGLYCERIN (NITROSTAT) 0.4 MG SL tablet Place 1 tablet (0.4 mg total) under the tongue every 5 (five) minutes x 3 doses as needed. 04/15/16   Thompson Grayer, MD  [facility] VERIO test strip  08/17/17   [provider]   sertraline (ZOLOFT) 100 MG tablet Take 100 mg by mouth daily. 04/19/19   [provider]  simvastatin (ZOCOR) 40 MG tablet Take 40 mg by mouth at bedtime.    [provider]  tamsulosin (FLOMAX) 0.4 MG CAPS capsule Take 0.4 mg by mouth daily. 09/03/20   [provider]      Allergies    Contrast media [iodinated contrast media], Vioxx [rofecoxib], Shellfish allergy, and Sulfonamide derivatives    Review of Systems   Review of Systems  Genitourinary:  Positive for flank pain.    Physical Exam Updated Vital Signs BP (!) 182/86   Pulse 64   Temp 98 F (36.7 C) (Oral)   Resp 17   Ht 6' (1.829 m)   Wt 83 kg   SpO2 100%   BMI 24.82 kg/m  Physical Exam Vitals reviewed.  HENT:     Head: Normocephalic.  Eyes:     Pupils: Pupils are equal, round, and reactive to light.  Pulmonary:     Comments: Tenderness to left lateral lower chest wall and right posterior lower chest wall.  No crepitance or deformity.  No ecchymosis. Chest:     Chest wall: Tenderness present.  Abdominal:     Tenderness: There is no abdominal tenderness.  Musculoskeletal:        General: No tenderness.     Cervical back: Neck supple. No tenderness.  Skin:    Capillary Refill: Capillary refill takes less than 2 seconds.  Neurological:     Mental Status: He is alert. Mental status is at baseline.     ED Results / Procedures / Treatments   Labs (all labs ordered are listed, but only abnormal results are displayed) Labs Reviewed  URINALYSIS, ROUTINE W REFLEX MICROSCOPIC - Abnormal; Notable for the following components:      Result Value   Protein, ur 100 (*)    All other components within normal limits  CBC - Abnormal; Notable for the following components:   WBC 3.2 (*)    RBC 3.27 (*)    Hemoglobin 11.9 (*)    HCT 34.9 (*)    MCV 106.7 (*)    MCH 36.4 (*)    Platelets 81 (*)    All other components within normal limits  COMPREHENSIVE METABOLIC PANEL - Abnormal; Notable for  the following components:   Glucose, Bld 144 (*)    BUN 28 (*)    Creatinine, Ser 1.25 (*)    GFR, Estimated 59 (*)    All other components within normal limits    EKG None  Radiology CT CHEST ABDOMEN PELVIS WO CONTRAST  Result Date: 04/23/2022 CLINICAL DATA:  Fall out of bed  this morning. Right-sided chest and abdominal pain. Dementia. EXAM: CT CHEST, ABDOMEN AND PELVIS WITHOUT CONTRAST TECHNIQUE: Multidetector CT imaging of the chest, abdomen and pelvis was performed following the standard protocol without IV contrast. RADIATION DOSE REDUCTION: This exam was performed according to the departmental dose-optimization program which includes automated exposure control, adjustment of the mA and/or kV according to patient size and/or use of iterative reconstruction technique. COMPARISON:  AP only CT on 04/11/2017 FINDINGS: CT CHEST FINDINGS Cardiovascular: No evidence of mediastinal hematoma. No pericardial effusion. Mild-to-moderate cardiomegaly. Aortic and coronary atherosclerotic calcification incidentally noted. Mediastinum/Nodes: No evidence of hemorrhage or pneumomediastinum. No masses or pathologically enlarged lymph nodes identified on this noncontrast exam. Lungs/Pleura: No evidence of pulmonary contusion or other infiltrate. No evidence of pneumothorax or hemothorax. Stable sub-cm nodule in the lateral left lower lobe, consistent with benign etiology. Musculoskeletal: No acute fractures or suspicious bone lesions identified. CT ABDOMEN PELVIS FINDINGS Hepatobiliary: No hepatic parenchymal injury or mass identified on this noncontrast exam. Hepatic cirrhosis again demonstrated with recanalization of paraumbilical veins consistent with portal venous hypertension. Gallstones are seen, however there is no evidence of cholecystitis or biliary dilatation. Pancreas: No parenchymal abnormality identified on this noncontrast exam. Spleen: No evidence of parenchymal injury on this noncontrast exam.  Adrenal/Urinary Tract: No hemorrhage or parenchymal injury identified on this noncontrast exam. 1.6 cm calculus again seen in upper pole collecting system of right kidney. No evidence of ureteral calculi or hydronephrosis. Stomach/Bowel: Unopacified bowel loops are unremarkable in appearance. No evidence of hemoperitoneum. Diverticulosis is seen mainly involving the sigmoid colon, however there is no evidence of diverticulitis. Vascular/Lymphatic: No evidence of retroperitoneal hemorrhage. No pathologically enlarged lymph nodes identified. Reproductive:  Stable mildly enlarged prostate. Other:  None. Musculoskeletal: No acute fractures or suspicious bone lesions identified. Severe wedge compression fracture of the L1 vertebral body is new since 2019, but appears chronic. IMPRESSION: No evidence of acute injury within the chest, abdomen, or pelvis. Hepatic cirrhosis and findings of portal venous hypertension. Cholelithiasis. No radiographic evidence of cholecystitis. Right renal calculus. No evidence of ureteral calculi or hydronephrosis. Colonic diverticulosis, without radiographic evidence of diverticulitis. Stable mildly enlarged prostate. Chronic appearing L1 vertebral body compression fracture. Aortic Atherosclerosis (ICD10-I70.0). Electronically Signed   By: Marlaine Hind M.D.   On: 04/23/2022 09:14    Procedures Procedures    Medications Ordered in ED Medications - No data to display  ED Course/ Medical Decision Making/ A&P                             Medical Decision Making Amount and/or Complexity of Data Reviewed Labs: ordered. Radiology: ordered.   Patient with fall.  [place] 2 days ago however is on anticoagulation.  Denies hitting head but there is dementia.  However do not think need intracranial imaging since he is at his baseline at this point 2 days.  Also does have pain in chest and right flank.  No specific abdominal tenderness however with the anticoagulation I think would benefit  from further imaging.  Does have IV dye allergy but will get noncontrast CT chest abdomen pelvis scan.  Look for rib fractures intrathoracic or intra-abdominal injuries.  Discussed with the patient's son who is in the room with the patient.  Patient's lab work is stable to prior.  Does have a thrombocytopenia which is slightly worse but not to a significant degree.  CT scan done without contrast due to dye allergy.  However  no definite injury.  I think it is safer especially 2 days after injury for discharge home.  Discussed with patient and family about risk-benefit of his anticoagulation all follow-up with PCP and cardiology.  Discharge home.        Final Clinical Impression(s) / ED Diagnoses Final diagnoses:  Fall, initial encounter  Blunt trauma to chest, initial encounter    Rx / DC Orders ED Discharge Orders     None         Davonna Belling, MD 04/23/22 1027

## 2022-04-23 NOTE — Discharge Instructions (Signed)
Follow-up with his doctor for the mildly worsening decrease in his platelets but also the need for anticoagulation with his falls.

## 2022-04-23 NOTE — ED Notes (Signed)
Attempted to call facility several times with no answer for report. Son POA has report.

## 2022-05-03 DIAGNOSIS — E1051 Type 1 diabetes mellitus with diabetic peripheral angiopathy without gangrene: Secondary | ICD-10-CM | POA: Diagnosis not present

## 2022-05-03 DIAGNOSIS — L84 Corns and callosities: Secondary | ICD-10-CM | POA: Diagnosis not present

## 2022-05-04 NOTE — Progress Notes (Signed)
Remote pacemaker transmission.   

## 2022-05-09 ENCOUNTER — Other Ambulatory Visit: Payer: Self-pay | Admitting: *Deleted

## 2022-05-09 MED ORDER — METOPROLOL SUCCINATE ER 25 MG PO TB24: 25.0000 mg | ORAL_TABLET | Freq: Every day | ORAL | 3 refills | Status: DC

## 2022-05-10 DIAGNOSIS — I5042 Chronic combined systolic (congestive) and diastolic (congestive) heart failure: Secondary | ICD-10-CM | POA: Diagnosis not present

## 2022-05-10 DIAGNOSIS — E7849 Other hyperlipidemia: Secondary | ICD-10-CM | POA: Diagnosis not present

## 2022-05-10 DIAGNOSIS — E119 Type 2 diabetes mellitus without complications: Secondary | ICD-10-CM | POA: Diagnosis not present

## 2022-05-10 DIAGNOSIS — K219 Gastro-esophageal reflux disease without esophagitis: Secondary | ICD-10-CM | POA: Diagnosis not present

## 2022-05-10 DIAGNOSIS — G301 Alzheimer's disease with late onset: Secondary | ICD-10-CM | POA: Diagnosis not present

## 2022-05-10 DIAGNOSIS — M1A9XX0 Chronic gout, unspecified, without tophus (tophi): Secondary | ICD-10-CM | POA: Diagnosis not present

## 2022-05-10 DIAGNOSIS — D649 Anemia, unspecified: Secondary | ICD-10-CM | POA: Diagnosis not present

## 2022-05-10 DIAGNOSIS — E559 Vitamin D deficiency, unspecified: Secondary | ICD-10-CM | POA: Diagnosis not present

## 2022-05-10 DIAGNOSIS — M25561 Pain in right knee: Secondary | ICD-10-CM | POA: Diagnosis not present

## 2022-05-10 DIAGNOSIS — I7091 Generalized atherosclerosis: Secondary | ICD-10-CM | POA: Diagnosis not present

## 2022-05-11 DIAGNOSIS — D649 Anemia, unspecified: Secondary | ICD-10-CM | POA: Diagnosis not present

## 2022-05-12 ENCOUNTER — Inpatient Hospital Stay: Payer: Medicare Other | Attending: Hematology

## 2022-05-12 DIAGNOSIS — Z8249 Family history of ischemic heart disease and other diseases of the circulatory system: Secondary | ICD-10-CM | POA: Diagnosis not present

## 2022-05-12 DIAGNOSIS — Z825 Family history of asthma and other chronic lower respiratory diseases: Secondary | ICD-10-CM | POA: Insufficient documentation

## 2022-05-12 DIAGNOSIS — R42 Dizziness and giddiness: Secondary | ICD-10-CM | POA: Diagnosis not present

## 2022-05-12 DIAGNOSIS — R410 Disorientation, unspecified: Secondary | ICD-10-CM | POA: Insufficient documentation

## 2022-05-12 DIAGNOSIS — F32A Depression, unspecified: Secondary | ICD-10-CM | POA: Diagnosis not present

## 2022-05-12 DIAGNOSIS — Z801 Family history of malignant neoplasm of trachea, bronchus and lung: Secondary | ICD-10-CM | POA: Diagnosis not present

## 2022-05-12 DIAGNOSIS — D696 Thrombocytopenia, unspecified: Secondary | ICD-10-CM | POA: Insufficient documentation

## 2022-05-12 DIAGNOSIS — D539 Nutritional anemia, unspecified: Secondary | ICD-10-CM

## 2022-05-12 DIAGNOSIS — D472 Monoclonal gammopathy: Secondary | ICD-10-CM | POA: Diagnosis not present

## 2022-05-12 DIAGNOSIS — Z79899 Other long term (current) drug therapy: Secondary | ICD-10-CM | POA: Diagnosis not present

## 2022-05-12 DIAGNOSIS — F419 Anxiety disorder, unspecified: Secondary | ICD-10-CM | POA: Insufficient documentation

## 2022-05-12 LAB — COMPREHENSIVE METABOLIC PANEL
ALT: 24 U/L (ref 0–44)
AST: 25 U/L (ref 15–41)
Albumin: 4 g/dL (ref 3.5–5.0)
Alkaline Phosphatase: 115 U/L (ref 38–126)
Anion gap: 7 (ref 5–15)
BUN: 23 mg/dL (ref 8–23)
CO2: 24 mmol/L (ref 22–32)
Calcium: 9.1 mg/dL (ref 8.9–10.3)
Chloride: 105 mmol/L (ref 98–111)
Creatinine, Ser: 1.08 mg/dL (ref 0.61–1.24)
GFR, Estimated: >60 mL/min (ref >60)
Glucose, Bld: 110 mg/dL — ABNORMAL HIGH (ref 70–99)
Potassium: 4.6 mmol/L (ref 3.5–5.1)
Sodium: 136 mmol/L (ref 135–145)
Total Bilirubin: 0.9 mg/dL (ref 0.3–1.2)
Total Protein: 8 g/dL (ref 6.5–8.1)

## 2022-05-12 LAB — CBC WITH DIFFERENTIAL/PLATELET
Abs Immature Granulocytes: 0.01 10*3/uL (ref 0.00–0.07)
Basophils Absolute: 0 10*3/uL (ref 0.0–0.1)
Basophils Relative: 0 %
Eosinophils Absolute: 0.1 10*3/uL (ref 0.0–0.5)
Eosinophils Relative: 2 %
HCT: 35.4 % — ABNORMAL LOW (ref 39.0–52.0)
Hemoglobin: 12.1 g/dL — ABNORMAL LOW (ref 13.0–17.0)
Immature Granulocytes: 0 %
Lymphocytes Relative: 16 %
Lymphs Abs: 0.6 10*3/uL — ABNORMAL LOW (ref 0.7–4.0)
MCH: 37.7 pg — ABNORMAL HIGH (ref 26.0–34.0)
MCHC: 34.2 g/dL (ref 30.0–36.0)
MCV: 110.3 fL — ABNORMAL HIGH (ref 80.0–100.0)
Monocytes Absolute: 0.4 10*3/uL (ref 0.1–1.0)
Monocytes Relative: 10 %
Neutro Abs: 2.9 10*3/uL (ref 1.7–7.7)
Neutrophils Relative %: 72 %
Platelets: 88 10*3/uL — ABNORMAL LOW (ref 150–400)
RBC: 3.21 MIL/uL — ABNORMAL LOW (ref 4.22–5.81)
RDW: 13.2 % (ref 11.5–15.5)
WBC: 4 10*3/uL (ref 4.0–10.5)
nRBC: 0 % (ref 0.0–0.2)

## 2022-05-12 LAB — IRON AND TIBC
Iron: 126 ug/dL (ref 45–182)
Saturation Ratios: 40 % — ABNORMAL HIGH (ref 17.9–39.5)
TIBC: 319 ug/dL (ref 250–450)
UIBC: 193 ug/dL

## 2022-05-12 LAB — FERRITIN: Ferritin: 132 ng/mL (ref 24–336)

## 2022-05-13 LAB — KAPPA/LAMBDA LIGHT CHAINS
Kappa free light chain: 62.9 mg/L — ABNORMAL HIGH (ref 3.3–19.4)
Kappa, lambda light chain ratio: 2.71 — ABNORMAL HIGH (ref 0.26–1.65)
Lambda free light chains: 23.2 mg/L (ref 5.7–26.3)

## 2022-05-16 DIAGNOSIS — H401211 Low-tension glaucoma, right eye, mild stage: Secondary | ICD-10-CM | POA: Diagnosis not present

## 2022-05-17 LAB — PROTEIN ELECTROPHORESIS, SERUM
A/G Ratio: 1.1 (ref 0.7–1.7)
Albumin ELP: 4 g/dL (ref 2.9–4.4)
Alpha-1-Globulin: 0.2 g/dL (ref 0.0–0.4)
Alpha-2-Globulin: 0.6 g/dL (ref 0.4–1.0)
Beta Globulin: 0.8 g/dL (ref 0.7–1.3)
Gamma Globulin: 2.1 g/dL — ABNORMAL HIGH (ref 0.4–1.8)
Globulin, Total: 3.6 g/dL (ref 2.2–3.9)
M-Spike, %: 1.4 g/dL — ABNORMAL HIGH
Total Protein ELP: 7.6 g/dL (ref 6.0–8.5)

## 2022-05-18 DIAGNOSIS — G301 Alzheimer's disease with late onset: Secondary | ICD-10-CM | POA: Diagnosis not present

## 2022-05-18 DIAGNOSIS — F02B0 Dementia in other diseases classified elsewhere, moderate, without behavioral disturbance, psychotic disturbance, mood disturbance, and anxiety: Secondary | ICD-10-CM | POA: Diagnosis not present

## 2022-05-19 ENCOUNTER — Inpatient Hospital Stay: Payer: Medicare Other | Attending: Hematology | Admitting: Hematology

## 2022-05-19 VITALS — BP 158/64 | HR 59 | Temp 98.2°F | Resp 16 | Wt 185.0 lb

## 2022-05-19 DIAGNOSIS — Z91041 Radiographic dye allergy status: Secondary | ICD-10-CM | POA: Insufficient documentation

## 2022-05-19 DIAGNOSIS — D631 Anemia in chronic kidney disease: Secondary | ICD-10-CM | POA: Insufficient documentation

## 2022-05-19 DIAGNOSIS — Z801 Family history of malignant neoplasm of trachea, bronchus and lung: Secondary | ICD-10-CM | POA: Insufficient documentation

## 2022-05-19 DIAGNOSIS — Z79899 Other long term (current) drug therapy: Secondary | ICD-10-CM | POA: Diagnosis not present

## 2022-05-19 DIAGNOSIS — Z7901 Long term (current) use of anticoagulants: Secondary | ICD-10-CM | POA: Insufficient documentation

## 2022-05-19 DIAGNOSIS — Z825 Family history of asthma and other chronic lower respiratory diseases: Secondary | ICD-10-CM | POA: Insufficient documentation

## 2022-05-19 DIAGNOSIS — D696 Thrombocytopenia, unspecified: Secondary | ICD-10-CM | POA: Diagnosis not present

## 2022-05-19 DIAGNOSIS — D472 Monoclonal gammopathy: Secondary | ICD-10-CM | POA: Insufficient documentation

## 2022-05-19 DIAGNOSIS — N189 Chronic kidney disease, unspecified: Secondary | ICD-10-CM | POA: Insufficient documentation

## 2022-05-19 DIAGNOSIS — Z8719 Personal history of other diseases of the digestive system: Secondary | ICD-10-CM | POA: Insufficient documentation

## 2022-05-19 DIAGNOSIS — R161 Splenomegaly, not elsewhere classified: Secondary | ICD-10-CM | POA: Diagnosis not present

## 2022-05-19 DIAGNOSIS — Z8249 Family history of ischemic heart disease and other diseases of the circulatory system: Secondary | ICD-10-CM | POA: Insufficient documentation

## 2022-05-19 DIAGNOSIS — D539 Nutritional anemia, unspecified: Secondary | ICD-10-CM | POA: Insufficient documentation

## 2022-05-19 DIAGNOSIS — Z882 Allergy status to sulfonamides status: Secondary | ICD-10-CM | POA: Diagnosis not present

## 2022-05-19 NOTE — Progress Notes (Signed)
[facility] [address] [place] [address]    Clinic Day:  06/10/2022  Referring physician: Monico Blitz, MD  Patient Care Team: Monico Blitz, MD as PCP - General (Internal Medicine) Satira Sark, MD as PCP - Cardiology (Cardiology) Thompson Grayer, MD (Inactive) as PCP - Electrophysiology (Cardiology)   ASSESSMENT & PLAN:   Assessment: 1.  IgA kappa smoldering myeloma: -Bone marrow biopsy on 09/05/2017 shows 12% plasma cells. -Skeletal survey on 11/29/2019 was negative for lytic lesions.   2.  Macrocytic anemia: -Hemoglobin is 12 and hematocrit is 36.  MCV is 107.  Previous work-up for 123456, folic acid was normal.   3.  Thrombocytopenia: -He has mild to moderate thrombocytopenia.  Platelet count is 99. -CT of the abdomen and pelvis on 04/11/2017 showed normal spleen. -Bone marrow biopsy showed normal megakaryocytes.  Most likely etiology is ITP.  Plan: 1.  IgA kappa smoldering myeloma: - No new bone pains reported. - Labs from 05/12/2022: M spike 1.4 g, stable.  Hemoglobin 12.1.  Creatinine 1.08.  Calcium normal.  Free light chain ratio is 2.71 with kappa light chain 62.9. - No "crab" features.  RTC 6 months with repeat myeloma panel and skeletal survey.   2.  Macrocytic anemia: - Anemia from CKD and functional iron deficiency.  He has received Feraheme at last visit. - Hemoglobin improved to 12.1 from 10.4.  Ferritin is 132 and percent saturation is 40. - Will check ferritin and iron panel at next visit.   3.  Thrombocytopenia: - This is from splenomegaly.  He has mildly enlarged spleen.  Platelet count is 88 K and stable.  No bleeding issues.  Orders Placed This Encounter  Procedures   DG Bone Survey Met    Standing Status:   Future    Standing Expiration Date:   05/19/2023    Order Specific Question:   Reason for Exam (SYMPTOM  OR DIAGNOSIS REQUIRED)    Answer:   smoldering myeloma    Order Specific Question:   Preferred imaging location?     Answer:   [facility]   CBC with Differential    Standing Status:   Future    Standing Expiration Date:   05/19/2023   Comprehensive metabolic panel    Standing Status:   Future    Standing Expiration Date:   05/19/2023   Ferritin    Standing Status:   Future    Standing Expiration Date:   05/19/2023   Iron and TIBC ([facility] ONLY)    Standing Status:   Future    Standing Expiration Date:   05/19/2023   Kappa/lambda light chains    Standing Status:   Future    Standing Expiration Date:   05/19/2023   Protein electrophoresis, serum    Standing Status:   Future    Standing Expiration Date:   05/19/2023      I,Alexis Herring,acting as a scribe for Derek Jack, MD.,have documented all relevant documentation on the behalf of Derek Jack, MD,as directed by  Derek Jack, MD while in the presence of Derek Jack, MD.   I, Derek Jack MD, have reviewed the above documentation for accuracy and completeness, and I agree with the above.   Derek Jack, MD   3/29/20242:56 PM  CHIEF COMPLAINT:   Diagnosis: smoldering multiple myeloma, macrocytic anemia and thrombocytopenia    Cancer Staging  No matching staging information was found for the patient.   Prior Therapy: none  Current  Therapy:  surveillance   HISTORY OF PRESENT ILLNESS:   Oncology History   No history exists.     INTERVAL HISTORY:   Jay Campbell is a 77 y.o. male presenting to clinic today for follow up of smoldering multiple myeloma, macrocytic anemia and thrombocytopenia. He was last seen by me on 01/11/22.  Patient has been treated in the ED 3 times since 01/2022 for falls. The first was on 01/28/22 wherein patient rolled out of bed onto the floor fracturing his left thumb. The second visit was on 02/07/22 wherein he reported losing his balance as he was sitting down on his bed causing him to fall and hit his head- CT head was unremarkable at that time.  Most recently he was seen on 04/23/22 x2 days after a fall for evaluation of bilateral chest wall pain- CT CAP showed no acute abnormalities at that time, CMP was stable, and UA negative for infection.  Today, he states that he is doing well overall. His appetite level is at 100%. His energy level is at 100%.    PAST MEDICAL HISTORY:   Past Medical History: Past Medical History:  Diagnosis Date   Alzheimer's dementia ([place])    Anxiety    Atrial fibrillation ([place])    Permanent   Coronary artery disease    Mild nonobstructive 1/12   DM (diabetes mellitus) ([facility])    GERD (gastroesophageal reflux disease)    Gout    Hearing disorder, cochlear    Hiatal hernia    HLD (hyperlipidemia)    HTN (hypertension)    S/P AV nodal ablation    s/p SJM PPM; gen change 02-01-13 by Dr Rayann Heman   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   Warfarin anticoagulation     Surgical History: Past Surgical History:  Procedure Laterality Date   CATARACT EXTRACTION Right    COCHLEAR IMPLANT Right    HEMORRHOID BANDING     KNEE ARTHROSCOPY Right 1992   Dr Luna Glasgow    PACEMAKER GENERATOR CHANGE Bilateral 02/01/2013   Procedure: PACEMAKER GENERATOR CHANGE;  Surgeon: Coralyn Mark, MD;  Location[facility] C[facility] LAB;  Service: Cardiovascular;  Laterality: Bilateral;   PACEMAKER PLACEMENT  2002; 2014   SJM implanted by Dr Lovena Le with AV nodal ablation performed; gen change to [employer] SR RF by Dr Rayann Heman 02-01-13   RETINAL LASER PROCEDURE Left    SLT LASER APPLICATION Left [id number]   Procedure: SLT LASER APPLICATION;  Surgeon: Williams Che, MD;  Location: [facility];  Service: Ophthalmology;  Laterality: Left;    Social History: Social History   Socioeconomic History   Marital status: Married    Spouse name: Not on file   Number of children: Not on file   Years of education: 11   Highest education level: Not on file  Occupational History   Not on file  Tobacco Use   Smoking status: Never   Smokeless tobacco:  Never  Vaping Use   Vaping Use: Never used  Substance and Sexual Activity   Alcohol use: No    Alcohol/week: 0.0 standard drinks of alcohol   Drug use: No   Sexual activity: Not Currently  Other Topics Concern   Not on file  Social History Narrative   Not on file   Social Determinants of Health   Financial Resource Strain: Not on file  Food Insecurity: Not on file  Transportation Needs: Not on file  Physical Activity: Not on file  Stress: Not on file  Social Connections: Not on file  Intimate Partner Violence: Not on file    Family History: Family History  Problem Relation Age of Onset   COPD Mother    Emphysema Mother    Cancer - Lung Father    Heart disease Paternal Uncle    Stomach cancer Neg Hx    Colon cancer Neg Hx     Current Medications:  Current Outpatient Medications:    allopurinol (ZYLOPRIM) 100 MG tablet, Take 100 mg by mouth Daily at 5am., Disp: , Rfl:    BD INSULIN SYRINGE U/F 31G X 5/16" 0.3 ML MISC, , Disp: , Rfl:    cholecalciferol (VITAMIN D3) 25 MCG (1000 UNIT) tablet, Take 1,000 Units by mouth daily., Disp: , Rfl:    colchicine 0.6 MG tablet, Take 0.6 mg by mouth as needed., Disp: , Rfl:    Continuous Blood Gluc Sensor (FREESTYLE LIBRE 14 DAY SENSOR) MISC, , Disp: , Rfl:    diphenhydrAMINE-APAP, sleep, (TYLENOL PM EXTRA STRENGTH) 50-1000 MG/30ML LIQD, Take 1,000 mLs by mouth daily., Disp: , Rfl:    donepezil (ARICEPT) 10 MG tablet, Take 1 tablet (10 mg total) by mouth at bedtime., Disp: 30 tablet, Rfl: 11   ELIQUIS 2.5 MG TABS tablet, Take 2.5 mg by mouth 2 (two) times daily., Disp: , Rfl:    famotidine (PEPCID) 20 MG tablet, Take 20 mg by mouth 2 (two) times daily., Disp: , Rfl:    ferrous sulfate 325 (65 FE) MG EC tablet, Take 325 mg by mouth daily with breakfast., Disp: , Rfl:    glipiZIDE (GLUCOTROL) 5 MG tablet, Take 5 mg by mouth daily., Disp: , Rfl:    HUMULIN 70/30 (70-30) 100 UNIT/ML injection, Inject into the skin., Disp: , Rfl:     hydrOXYzine (ATARAX) 10 MG tablet, Take 10 mg by mouth 3 (three) times daily as needed for nausea., Disp: , Rfl:    isosorbide mononitrate (IMDUR) 60 MG 24 hr tablet, Take 1 tablet (60 mg total) by mouth daily., Disp: 30 tablet, Rfl: 6   latanoprost (XALATAN) 0.005 % ophthalmic solution, , Disp: , Rfl:    lisinopril (ZESTRIL) 10 MG tablet, Take 10 mg by mouth every evening., Disp: , Rfl:    meclizine (ANTIVERT) 25 MG tablet, Take 25 mg by mouth 3 (three) times daily as needed for dizziness., Disp: , Rfl:    memantine (NAMENDA) 10 MG tablet, TAKE 1 TABLET BY MOUTH TWICE  DAILY, Disp: 200 tablet, Rfl: 2   Menthol, Topical Analgesic, (BIOFREEZE) 4 % GEL, Apply topically as needed., Disp: , Rfl:    metFORMIN (GLUCOPHAGE) 1000 MG tablet, Take 1,000 mg by mouth 2 (two) times daily with a meal., Disp: , Rfl:    metoprolol succinate (TOPROL-XL) 25 MG 24 hr tablet, Take 1 tablet (25 mg total) by mouth daily., Disp: 90 tablet, Rfl: 3   ONETOUCH VERIO test strip, , Disp: , Rfl:    sertraline (ZOLOFT) 100 MG tablet, Take 100 mg by mouth daily., Disp: , Rfl:    simvastatin (ZOCOR) 40 MG tablet, Take 40 mg by mouth at bedtime., Disp: , Rfl:    tamsulosin (FLOMAX) 0.4 MG CAPS capsule, Take 0.4 mg by mouth daily., Disp: , Rfl:    nitroGLYCERIN (NITROSTAT) 0.4 MG SL tablet, Place 1 tablet (0.4 mg total) under the tongue every 5 (five) minutes x 3 doses as needed. (Patient not taking: Reported on 05/19/2022), Disp: 25 tablet, Rfl: 3   Allergies: Allergies  Allergen Reactions   Contrast Media [Iodinated Contrast Media]  Vioxx [Rofecoxib]     On pt MAR   Shellfish Allergy Itching and Rash   Sulfonamide Derivatives Itching and Rash    REVIEW OF SYSTEMS:   Review of Systems  Constitutional:  Negative for chills, fatigue and fever.  HENT:   Negative for lump/mass, mouth sores, nosebleeds, sore throat and trouble swallowing.   Eyes:  Negative for eye problems.  Respiratory:  Negative for cough and shortness  of breath.   Cardiovascular:  Negative for chest pain, leg swelling and palpitations.  Gastrointestinal:  Negative for abdominal pain, constipation, diarrhea, nausea and vomiting.  Genitourinary:  Negative for bladder incontinence, difficulty urinating, dysuria, frequency, hematuria and nocturia.   Musculoskeletal:  Negative for arthralgias, back pain, flank pain, myalgias and neck pain.  Skin:  Negative for itching and rash.  Neurological:  Negative for dizziness, headaches and numbness.  Hematological:  Does not bruise/bleed easily.  Psychiatric/Behavioral:  Negative for depression, sleep disturbance and suicidal ideas. The patient is not nervous/anxious.   All other systems reviewed and are negative.    VITALS:   Blood pressure (!) 158/64, pulse (!) 59, temperature 98.2 F (36.8 C), temperature source Oral, resp. rate 16, weight 185 lb (83.9 kg), SpO2 100 %.  Wt Readings from Last 3 Encounters:  05/19/22 185 lb (83.9 kg)  04/23/22 183 lb (83 kg)  01/28/22 191 lb (86.6 kg)    Body mass index is 25.09 kg/m.  Performance status (ECOG): 1 - Symptomatic but completely ambulatory  PHYSICAL EXAM:   Physical Exam Vitals and nursing note reviewed. Exam conducted with a chaperone present.  Constitutional:      Appearance: Normal appearance.  Cardiovascular:     Rate and Rhythm: Normal rate and regular rhythm.     Pulses: Normal pulses.     Heart sounds: Normal heart sounds.  Pulmonary:     Effort: Pulmonary effort is normal.     Breath sounds: Normal breath sounds.  Abdominal:     Palpations: Abdomen is soft. There is no hepatomegaly, splenomegaly or mass.     Tenderness: There is no abdominal tenderness.  Musculoskeletal:     Right lower leg: No edema.     Left lower leg: No edema.  Lymphadenopathy:     Cervical: No cervical adenopathy.     Right cervical: No superficial, deep or posterior cervical adenopathy.    Left cervical: No superficial, deep or posterior cervical  adenopathy.     Upper Body:     Right upper body: No supraclavicular or axillary adenopathy.     Left upper body: No supraclavicular or axillary adenopathy.  Neurological:     General: No focal deficit present.     Mental Status: He is alert and oriented to person, place, and time.  Psychiatric:        Mood and Affect: Mood normal.        Behavior: Behavior normal.     LABS:      Latest Ref Rng & Units 05/12/2022    9:58 AM 04/23/2022    8:45 AM 01/03/2022   11:27 AM  CBC  WBC 4.0 - 10.5 K/uL 4.0  3.2  2.5   Hemoglobin 13.0 - 17.0 g/dL 12.1  11.9  10.4   Hematocrit 39.0 - 52.0 % 35.4  34.9  32.2   Platelets 150 - 400 K/uL 88  81  92       Latest Ref Rng & Units 05/12/2022    9:58 AM 04/23/2022    8:45  AM 01/03/2022   11:27 AM  CMP  Glucose 70 - 99 mg/dL 110  144  143   BUN 8 - 23 mg/dL 23  28  35   Creatinine 0.61 - 1.24 mg/dL 1.08  1.25  1.44   Sodium 135 - 145 mmol/L 136  138  138   Potassium 3.5 - 5.1 mmol/L 4.6  4.9  5.1   Chloride 98 - 111 mmol/L 105  105  106   CO2 22 - 32 mmol/L 24  23  25    Calcium 8.9 - 10.3 mg/dL 9.1  9.0  8.8   Total Protein 6.5 - 8.1 g/dL 8.0  7.8  7.4   Total Bilirubin 0.3 - 1.2 mg/dL 0.9  1.0  0.5   Alkaline Phos 38 - 126 U/L 115  99  129   AST 15 - 41 U/L 25  23  21    ALT 0 - 44 U/L 24  20  13       No results found for: "CEA1", "CEA" / No results found for: "CEA1", "CEA" No results found for: "PSA1" No results found for: "CAN199" No results found for: "CAN125"  Lab Results  Component Value Date   TOTALPROTELP 7.6 05/12/2022   ALBUMINELP 4.0 05/12/2022   A1GS 0.2 05/12/2022   A2GS 0.6 05/12/2022   BETS 0.8 05/12/2022   GAMS 2.1 (H) 05/12/2022   MSPIKE 1.4 (H) 05/12/2022   SPEI Comment 05/12/2022   Lab Results  Component Value Date   TIBC 319 05/12/2022   TIBC 376 01/03/2022   TIBC 309 06/02/2021   FERRITIN 132 05/12/2022   FERRITIN 31 01/03/2022   FERRITIN 198 06/02/2021   IRONPCTSAT 40 (H) 05/12/2022   IRONPCTSAT 21  01/03/2022   IRONPCTSAT 12 (L) 06/02/2021   Lab Results  Component Value Date   LDH 133 01/03/2022   LDH 161 06/02/2021   LDH 128 10/21/2020     STUDIES:   No results found.

## 2022-05-19 NOTE — Patient Instructions (Addendum)
Jay Campbell at [facility] Discharge Instructions   You were seen and examined today by Dr. Delton Coombes.  He reviewed the results of your lab work which are normal/stable. Your iron has improved after receiving IV iron. Your m-spike (smoldering myeloma protein) is stable at 1.4.        Thank you for choosing [place] at [facility] to provide your oncology and hematology care.  To afford each patient quality time with our provider, please arrive at least 15 minutes before your scheduled appointment time.   If you have a lab appointment with the [facility] please come in thru the Main Entrance and check in at the main information desk.  You need to re-schedule your appointment should you arrive 10 or more minutes late.  We strive to give you quality time with our providers, and arriving late affects you and other patients whose appointments are after yours.  Also, if you no show three or more times for appointments you may be dismissed from the clinic at the providers discretion.     Again, thank you for choosing [facility].  Our hope is that these requests will decrease the amount of time that you wait before being seen by our physicians.       _____________________________________________________________  Should you have questions after your visit to [facility], please contact our office at [phone] and follow the prompts.  Our office hours are 8:00 a.m. and 4:30 p.m. Monday - Friday.  Please note that voicemails left after 4:00 p.m. may not be returned until the following business day.  We are closed weekends and major holidays.  You do have access to a nurse 24-7, just call the main number to the clinic [phone] and do not press any options, hold on the line and a nurse will answer the phone.    For prescription refill requests, have your pharmacy contact our office and allow 72 hours.    Due to Covid,  you will need to wear a mask upon entering the hospital. If you do not have a mask, a mask will be given to you at the Main Entrance upon arrival. For doctor visits, patients may have 1 support person age 27 or older with them. For treatment visits, patients can not have anyone with them due to social distancing guidelines and our immunocompromised population.

## 2022-05-24 DIAGNOSIS — R0781 Pleurodynia: Secondary | ICD-10-CM | POA: Diagnosis not present

## 2022-05-24 DIAGNOSIS — R296 Repeated falls: Secondary | ICD-10-CM | POA: Diagnosis not present

## 2022-05-24 DIAGNOSIS — M79671 Pain in right foot: Secondary | ICD-10-CM | POA: Diagnosis not present

## 2022-05-24 DIAGNOSIS — R0782 Intercostal pain: Secondary | ICD-10-CM | POA: Diagnosis not present

## 2022-05-31 DIAGNOSIS — R296 Repeated falls: Secondary | ICD-10-CM | POA: Diagnosis not present

## 2022-05-31 DIAGNOSIS — R0782 Intercostal pain: Secondary | ICD-10-CM | POA: Diagnosis not present

## 2022-06-06 DIAGNOSIS — G894 Chronic pain syndrome: Secondary | ICD-10-CM | POA: Diagnosis not present

## 2022-06-07 DIAGNOSIS — E119 Type 2 diabetes mellitus without complications: Secondary | ICD-10-CM | POA: Diagnosis not present

## 2022-06-07 DIAGNOSIS — M1A9XX0 Chronic gout, unspecified, without tophus (tophi): Secondary | ICD-10-CM | POA: Diagnosis not present

## 2022-06-07 DIAGNOSIS — D649 Anemia, unspecified: Secondary | ICD-10-CM | POA: Diagnosis not present

## 2022-06-07 DIAGNOSIS — I5042 Chronic combined systolic (congestive) and diastolic (congestive) heart failure: Secondary | ICD-10-CM | POA: Diagnosis not present

## 2022-06-07 DIAGNOSIS — E559 Vitamin D deficiency, unspecified: Secondary | ICD-10-CM | POA: Diagnosis not present

## 2022-06-07 DIAGNOSIS — M25561 Pain in right knee: Secondary | ICD-10-CM | POA: Diagnosis not present

## 2022-06-07 DIAGNOSIS — I7091 Generalized atherosclerosis: Secondary | ICD-10-CM | POA: Diagnosis not present

## 2022-06-07 DIAGNOSIS — E7849 Other hyperlipidemia: Secondary | ICD-10-CM | POA: Diagnosis not present

## 2022-06-07 DIAGNOSIS — K219 Gastro-esophageal reflux disease without esophagitis: Secondary | ICD-10-CM | POA: Diagnosis not present

## 2022-06-10 ENCOUNTER — Encounter: Payer: Self-pay | Admitting: Hematology

## 2022-06-27 DIAGNOSIS — M545 Low back pain, unspecified: Secondary | ICD-10-CM | POA: Diagnosis not present

## 2022-06-27 DIAGNOSIS — G894 Chronic pain syndrome: Secondary | ICD-10-CM | POA: Diagnosis not present

## 2022-06-30 ENCOUNTER — Ambulatory Visit: Payer: Medicare Other | Admitting: Adult Health

## 2022-07-07 ENCOUNTER — Encounter: Payer: Self-pay | Admitting: Adult Health

## 2022-07-07 ENCOUNTER — Ambulatory Visit (INDEPENDENT_AMBULATORY_CARE_PROVIDER_SITE_OTHER): Payer: Medicare Other | Admitting: Adult Health

## 2022-07-07 VITALS — BP 161/71 | HR 62 | Ht 71.0 in | Wt 182.0 lb

## 2022-07-07 DIAGNOSIS — F0280 Dementia in other diseases classified elsewhere without behavioral disturbance: Secondary | ICD-10-CM

## 2022-07-07 DIAGNOSIS — G301 Alzheimer's disease with late onset: Secondary | ICD-10-CM | POA: Diagnosis not present

## 2022-07-07 NOTE — Patient Instructions (Signed)
Your Plan:  Continue Aricept and Namenda MMSE is stable. Can follow-up with PCP for future refills Would recommend speaking to PCP about adjusting or adding a different anti-depressant due to emotional state.    Thank you for coming to see [place] at [facility]. I hope we have been able to provide you high quality care today.  You may receive a patient satisfaction survey over the next few weeks. We would appreciate your feedback and comments so that we may continue to improve ourselves and the health of our patients.

## 2022-07-07 NOTE — Progress Notes (Signed)
PATIENT: Jay Campbell DOB: 17-Aug-1945  REASON FOR VISIT: follow up HISTORY FROM: patient PRIMARY NEUROLOGIST: [name]  Chief Complaint  Patient presents with   Follow-up    Pt in 4 with transport Pt here for memory f/u      HISTORY OF PRESENT ILLNESS: Today 07/07/22  Jay Campbell is a 77 y.o. male who has been followed in this office for Alzhemiers disease. Returns today for follow-up.  He continues to live at [facility].  He is here today with a transporter-Angie.  He continues on Aricept and Namenda.  He requires some assistance with ADLs.  He is able to feed himself.  Notes from the nursing facility state that he is emotional at times.  He did get emotional when the nurse was performing memory testing.  The patient is on Zoloft prescribed by his PCP.  He returns today for evaluation.  HISTORY (copied from Ihor Austin NP note): Update 01/13/2022 Jay Campbell: Patient returns for yearly dementia follow-up. He is residing at [facility] and accompanied by facility transporter.  Reports cognition has gradually declined since prior visit.  MMSE today 13/30 (prior 15/30).  Remains on Namenda 10 mg twice daily.  Denies any behavioral concerns.  No further concerns at this time.     History provided for reference purposes only Update 01/13/2021 Jay Campbell: Returns for yearly follow-up re: dementia likely Alzheimer's type.  He is accompanied by his wife, Jay Campbell.  Overall well since prior visit.  Cognition has been stable per wife although can fluctuate.  Remains on Namenda 10 mg twice daily -denies side effects.  MMSE today 15/30 (prior 17/30). At times can be more fatigued and takes more naps during the day. At times, can have difficulty sleeping at night. Denies behavioral concerns.  No new concerns at this time.    Update 01/13/2020 Jay Campbell: [name] returns for follow-up accompanied by his wife regarding likely Alzheimer's dementia worsening after a fall in 09/2018.  Previously  seen by [name] for initial visit on 12/05/2018 and recommended 102-month follow-up but has not had a follow-up until this time.  Dementia panel obtained after prior visit which was within normal limits.  EEG showed mild generalized slowing correlating with diagnosis of memory loss and dementia without evidence of seizure activity.  He has remained on Namenda 10 mg twice daily tolerating well without side effects.  Since initial visit over 1 year ago, cognition has been stable without worsening.  MMSE today 17/30 previously 20/30.  No behavioral concerns such as delusions, hallucinations, agitation or safety concerns.  No concerns at this time.   Initial consult visit 12/05/2018 [name]: [name] is 77 year old Caucasian male seen today for initial office consultation visit dementia and confusion following recent head injury.  History is obtained from the patient and his wife who is accompanying him as well as review of referral notes and have personally reviewed imaging films in PACS.  He has a past medical history of diabetes, hypertension, hyperlipidemia lipidemia, paroxysmal A. fib on long-term warfarin, CHF, pacemaker placement and depression.  Patient wife states that he is had longstanding history of greater than a year of mild cognitive impairment and suspected dementia which was not never formally diagnosed with he was doing well at home and was compensating.  He had a fall in July 2020 when he hit his head and fell to the ground and dislodged his cochlear implant from his right mastoid region.  He was confused weak and  disoriented.  He was admitted to [facility] for several days.  CT scan of the head on admission on 10/11/2018 showed no acute abnormality.  Repeat CT scan on 10/15/2018 also showed no acute abnormality.  MRI could not be obtained since he has a pacemaker.  Admission labs unremarkable CBC and complete metabolic panel labs were normal.  UA showed no infection.  Patient's cognitive  status and confusion has never returned back to baseline following this and his wife is concerned about dementia.  Patient did not have any focal neurological deficits in the form of slurred speech, extremity weakness numbness loss of vision during this hospitalization.  Patient primary physician felt that he likely had a stroke even though it was not seen on CT scan.  Patient did have some headaches after this episode which appear to have improved however his confusion and cognitive decline have persisted.  Patient does not have any family history of Alzheimer's.  There is no prior history of strokes, seizures, loss of consciousness or major head injury with loss of consciousness.  Patient has never had any lab work for reversible causes of dementia or a trial of medication like Aricept or Namenda.  He continues to have poor appetite and his intake is not good though his wife has convinced him to take Glucerna nutritional supplement.  There are no delusions, hallucinations, agitations, unsafe behavior.  He can walk independently and has not had recurrent falls.    REVIEW OF SYSTEMS: Out of a complete 14 system review of symptoms, the patient complains only of the following symptoms, and all other reviewed systems are negative.  ALLERGIES: Allergies  Allergen Reactions   Contrast Media [Iodinated Contrast Media]    Vioxx [Rofecoxib]     On pt MAR   Shellfish Allergy Itching and Rash   Sulfonamide Derivatives Itching and Rash    HOME MEDICATIONS: Outpatient Medications Prior to Visit  Medication Sig Dispense Refill   allopurinol (ZYLOPRIM) 100 MG tablet Take 100 mg by mouth Daily at 5am.     BD INSULIN SYRINGE U/F 31G X 5/16" 0.3 ML MISC      cholecalciferol (VITAMIN D3) 25 MCG (1000 UNIT) tablet Take 1,000 Units by mouth daily.     colchicine 0.6 MG tablet Take 0.6 mg by mouth as needed.     Continuous Blood Gluc Sensor (FREESTYLE LIBRE 14 DAY SENSOR) MISC      diphenhydrAMINE-APAP, sleep,  (TYLENOL PM EXTRA STRENGTH) 50-1000 MG/30ML LIQD Take 1,000 mLs by mouth daily.     donepezil (ARICEPT) 10 MG tablet Take 1 tablet (10 mg total) by mouth at bedtime. 30 tablet 11   ELIQUIS 2.5 MG TABS tablet Take 2.5 mg by mouth 2 (two) times daily.     famotidine (PEPCID) 20 MG tablet Take 20 mg by mouth 2 (two) times daily.     ferrous sulfate 325 (65 FE) MG EC tablet Take 325 mg by mouth daily with breakfast.     glipiZIDE (GLUCOTROL) 5 MG tablet Take 5 mg by mouth daily.     HUMULIN 70/30 (70-30) 100 UNIT/ML injection Inject into the skin.     hydrOXYzine (ATARAX) 10 MG tablet Take 10 mg by mouth 3 (three) times daily as needed for nausea.     isosorbide mononitrate (IMDUR) 60 MG 24 hr tablet Take 1 tablet (60 mg total) by mouth daily. 30 tablet 6   latanoprost (XALATAN) 0.005 % ophthalmic solution      lisinopril (ZESTRIL) 10 MG tablet Take  10 mg by mouth every evening.     meclizine (ANTIVERT) 25 MG tablet Take 25 mg by mouth 3 (three) times daily as needed for dizziness.     memantine (NAMENDA) 10 MG tablet TAKE 1 TABLET BY MOUTH TWICE  DAILY 200 tablet 2   Menthol, Topical Analgesic, (BIOFREEZE) 4 % GEL Apply topically as needed.     metFORMIN (GLUCOPHAGE) 1000 MG tablet Take 1,000 mg by mouth 2 (two) times daily with a meal.     metoprolol succinate (TOPROL-XL) 25 MG 24 hr tablet Take 1 tablet (25 mg total) by mouth daily. 90 tablet 3   nitroGLYCERIN (NITROSTAT) 0.4 MG SL tablet Place 1 tablet (0.4 mg total) under the tongue every 5 (five) minutes x 3 doses as needed. (Patient not taking: Reported on 05/19/2022) 25 tablet 3   ONETOUCH VERIO test strip      sertraline (ZOLOFT) 100 MG tablet Take 100 mg by mouth daily.     simvastatin (ZOCOR) 40 MG tablet Take 40 mg by mouth at bedtime.     tamsulosin (FLOMAX) 0.4 MG CAPS capsule Take 0.4 mg by mouth daily.     No facility-administered medications prior to visit.    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   Warfarin anticoagulation     PAST SURGICAL HISTORY: Past Surgical History:  Procedure Laterality Date   CATARACT EXTRACTION Right    COCHLEAR IMPLANT Right    HEMORRHOID BANDING     KNEE ARTHROSCOPY Right 1992   Dr Hilda Lias    PACEMAKER GENERATOR CHANGE Bilateral 02/01/2013   Procedure: PACEMAKER GENERATOR CHANGE;  Surgeon: Gardiner Rhyme, MD;  Location: [facility] CATH LAB;  Service: Cardiovascular;  Laterality: Bilateral;   PACEMAKER PLACEMENT  2002; 2014   SJM implanted by Dr Ladona Ridgel with AV nodal ablation performed; gen change to [employer] SR RF by Dr Johney Frame 02-01-13   RETINAL LASER PROCEDURE Left    SLT LASER APPLICATION Left 12/14/2015   Procedure: SLT LASER APPLICATION;  Surgeon: Susa Simmonds, MD;  Location: [facility] ORS;  Service: Ophthalmology;  Laterality: Left;    FAMILY HISTORY: Family History  Problem Relation Age of Onset   COPD Mother    Emphysema Mother    Cancer - Lung Father    Heart disease Paternal Uncle    Stomach cancer Neg Hx    Colon cancer Neg Hx     SOCIAL HISTORY: Social History   Socioeconomic History   Marital status: Married    Spouse name: Not on file   Number of children: Not on file   Years of education: 11   Highest education level: Not on file  Occupational History   Not on file  Tobacco Use   Smoking status: Never   Smokeless tobacco: Never  Vaping Use   Vaping Use: Never used  Substance and Sexual Activity   Alcohol use: No    Alcohol/week: 0.0 standard drinks of alcohol  Drug use: No   Sexual activity: Not  Currently  Other Topics Concern   Not on file  Social History Narrative   Not on file   Social Determinants of Health   Financial Resource Strain: Not on file  Food Insecurity: Not on file  Transportation Needs: Not on file  Physical Activity: Not on file  Stress: Not on file  Social Connections: Not on file  Intimate Partner Violence: Not on file      PHYSICAL EXAM  Vitals:   07/07/22 0900  BP: (!) 161/71  Pulse: 62  Weight: 182 lb (82.6 kg)  Height: 5\' 11"  (1.803 m)   Body mass index is 25.38 kg/m.     07/07/2022    9:03 AM 01/13/2022   12:43 PM 01/13/2021    1:10 PM  MMSE - Mini Mental State Exam  Orientation to time 0 1 2  Orientation to Place 4 4 4   Registration 3 3 2   Attention/ Calculation 1 0 0  Recall 0 0 1  Language- name 2 objects 2 2 2   Language- repeat 1 1 0  Language- follow 3 step command 2 1 3   Language- read & follow direction 0 1 1  Write a sentence 0 0 0  Copy design 0 0 0  Total score 13 13 15      Generalized: Well developed, in no acute distress   Neurological examination  Mentation: Alert oriented to person and place.  Follows all commands speech and language fluent Cranial nerve II-XII: Pupils were equal round reactive to light. Extraocular movements were full, visual field were full on confrontational test. Facial sensation and strength were normal. Head turning and shoulder shrug  were normal and symmetric. Motor: The motor testing reveals 5 over 5 strength of all 4 extremities. Good symmetric motor tone is noted throughout.  Sensory: Sensory testing is intact to soft touch on all 4 extremities. No evidence of extinction is noted.  Coordination: Cerebellar testing reveals good finger-nose-finger and heel-to-shin bilaterally.  Gait and station: Gait is slightly unsteady.  Uses a rollator when ambulating   DIAGNOSTIC DATA (LABS, IMAGING, TESTING) - I reviewed patient records, labs, notes, testing and imaging myself where  available.  Lab Results  Component Value Date   WBC 4.0 05/12/2022   HGB 12.1 (L) 05/12/2022   HCT 35.4 (L) 05/12/2022   MCV 110.3 (H) 05/12/2022   PLT 88 (L) 05/12/2022      Component Value Date/Time   NA 136 05/12/2022 0958   K 4.6 05/12/2022 0958   CL 105 05/12/2022 0958   CO2 24 05/12/2022 0958   GLUCOSE 110 (H) 05/12/2022 0958   BUN 23 05/12/2022 0958   CREATININE 1.08 05/12/2022 0958   CALCIUM 9.1 05/12/2022 0958   PROT 8.0 05/12/2022 0958   ALBUMIN 4.0 05/12/2022 0958   AST 25 05/12/2022 0958   ALT 24 05/12/2022 0958   ALKPHOS 115 05/12/2022 0958   BILITOT 0.9 05/12/2022 0958   GFRNONAA >60 05/12/2022 0958   GFRAA >60 11/28/2019 1109   Lab Results  Component Value Date   CHOL 108 03/12/2018   HDL 35 (L) 03/12/2018   LDLCALC 59 03/12/2018   TRIG 71 03/12/2018   CHOLHDL 3.1 03/12/2018   Lab Results  Component Value Date   HGBA1C 6.1 (H) 03/12/2018   Lab Results  Component Value Date   VITAMINB12 304 10/21/2020   Lab Results  Component Value Date   TSH 2.400 12/05/2018      ASSESSMENT  AND PLAN 77 y.o. year old male  has a past medical history of Alzheimer's dementia (HCC), Anxiety, Atrial fibrillation (HCC), Coronary artery disease, DM (diabetes mellitus) (HCC), GERD (gastroesophageal reflux disease), Gout, Hearing disorder, cochlear, Hiatal hernia, HLD (hyperlipidemia), HTN (hypertension), S/P AV nodal ablation, Smoldering multiple myeloma (12/29/2017), and Warfarin anticoagulation. here with :  Late onset Alzheimer's disease  - MMSE stable 13/30 -Continue Aricept and Namenda -Advised to follow-up with PCP regarding emotional state.  Antidepressant may need to be increased or another medication added -Follow-up with our office as needed   Butch Penny, MSN, NP-C 07/07/2022, 8:58 AM [facility] [address], [address] [place], [place] [address] [phone]

## 2022-07-11 DIAGNOSIS — E1151 Type 2 diabetes mellitus with diabetic peripheral angiopathy without gangrene: Secondary | ICD-10-CM | POA: Diagnosis not present

## 2022-07-11 DIAGNOSIS — M1A9XX0 Chronic gout, unspecified, without tophus (tophi): Secondary | ICD-10-CM | POA: Diagnosis not present

## 2022-07-11 DIAGNOSIS — E782 Mixed hyperlipidemia: Secondary | ICD-10-CM | POA: Diagnosis not present

## 2022-07-11 DIAGNOSIS — G301 Alzheimer's disease with late onset: Secondary | ICD-10-CM | POA: Diagnosis not present

## 2022-07-11 DIAGNOSIS — K219 Gastro-esophageal reflux disease without esophagitis: Secondary | ICD-10-CM | POA: Diagnosis not present

## 2022-07-15 ENCOUNTER — Ambulatory Visit (INDEPENDENT_AMBULATORY_CARE_PROVIDER_SITE_OTHER): Payer: Medicare Other

## 2022-07-15 DIAGNOSIS — I442 Atrioventricular block, complete: Secondary | ICD-10-CM | POA: Diagnosis not present

## 2022-07-15 LAB — CUP PACEART REMOTE DEVICE CHECK
Battery Remaining Longevity: 38 mo
Battery Remaining Percentage: 28 %
Battery Voltage: 2.96 V
Brady Statistic RV Percent Paced: 99 %
Date Time Interrogation Session: 20240503040023
Implantable Lead Connection Status: 753985
Implantable Lead Implant Date: 20020215
Implantable Lead Location: 753860
Implantable Pulse Generator Implant Date: 20141121
Lead Channel Impedance Value: 580 Ohm
Lead Channel Pacing Threshold Amplitude: 1 V
Lead Channel Pacing Threshold Pulse Width: 0.4 ms
Lead Channel Sensing Intrinsic Amplitude: 12 mV
Lead Channel Setting Pacing Amplitude: 1.25 V
Lead Channel Setting Pacing Pulse Width: 0.4 ms
Lead Channel Setting Sensing Sensitivity: 6 mV
Pulse Gen Model: 1240
Pulse Gen Serial Number: 7488472

## 2022-07-19 DIAGNOSIS — L84 Corns and callosities: Secondary | ICD-10-CM | POA: Diagnosis not present

## 2022-07-19 DIAGNOSIS — E1051 Type 1 diabetes mellitus with diabetic peripheral angiopathy without gangrene: Secondary | ICD-10-CM | POA: Diagnosis not present

## 2022-07-19 DIAGNOSIS — G301 Alzheimer's disease with late onset: Secondary | ICD-10-CM | POA: Diagnosis not present

## 2022-07-19 DIAGNOSIS — F02B0 Dementia in other diseases classified elsewhere, moderate, without behavioral disturbance, psychotic disturbance, mood disturbance, and anxiety: Secondary | ICD-10-CM | POA: Diagnosis not present

## 2022-07-25 DIAGNOSIS — G894 Chronic pain syndrome: Secondary | ICD-10-CM | POA: Diagnosis not present

## 2022-07-25 DIAGNOSIS — M545 Low back pain, unspecified: Secondary | ICD-10-CM | POA: Diagnosis not present

## 2022-07-29 DIAGNOSIS — K219 Gastro-esophageal reflux disease without esophagitis: Secondary | ICD-10-CM | POA: Diagnosis not present

## 2022-07-29 DIAGNOSIS — M6281 Muscle weakness (generalized): Secondary | ICD-10-CM | POA: Diagnosis not present

## 2022-07-29 DIAGNOSIS — I5020 Unspecified systolic (congestive) heart failure: Secondary | ICD-10-CM | POA: Diagnosis not present

## 2022-07-29 DIAGNOSIS — E119 Type 2 diabetes mellitus without complications: Secondary | ICD-10-CM | POA: Diagnosis not present

## 2022-08-01 DIAGNOSIS — G894 Chronic pain syndrome: Secondary | ICD-10-CM | POA: Diagnosis not present

## 2022-08-03 NOTE — Progress Notes (Signed)
Remote pacemaker transmission.   

## 2022-08-09 DIAGNOSIS — E1151 Type 2 diabetes mellitus with diabetic peripheral angiopathy without gangrene: Secondary | ICD-10-CM | POA: Diagnosis not present

## 2022-08-09 DIAGNOSIS — M545 Low back pain, unspecified: Secondary | ICD-10-CM | POA: Diagnosis not present

## 2022-08-09 DIAGNOSIS — E782 Mixed hyperlipidemia: Secondary | ICD-10-CM | POA: Diagnosis not present

## 2022-08-15 DIAGNOSIS — R350 Frequency of micturition: Secondary | ICD-10-CM | POA: Diagnosis not present

## 2022-08-16 DIAGNOSIS — H43393 Other vitreous opacities, bilateral: Secondary | ICD-10-CM | POA: Diagnosis not present

## 2022-08-16 DIAGNOSIS — H524 Presbyopia: Secondary | ICD-10-CM | POA: Diagnosis not present

## 2022-08-16 DIAGNOSIS — N390 Urinary tract infection, site not specified: Secondary | ICD-10-CM | POA: Diagnosis not present

## 2022-08-22 DIAGNOSIS — G894 Chronic pain syndrome: Secondary | ICD-10-CM | POA: Diagnosis not present

## 2022-09-05 DIAGNOSIS — E119 Type 2 diabetes mellitus without complications: Secondary | ICD-10-CM | POA: Diagnosis not present

## 2022-09-05 DIAGNOSIS — K219 Gastro-esophageal reflux disease without esophagitis: Secondary | ICD-10-CM | POA: Diagnosis not present

## 2022-09-05 DIAGNOSIS — M109 Gout, unspecified: Secondary | ICD-10-CM | POA: Diagnosis not present

## 2022-09-09 DIAGNOSIS — E782 Mixed hyperlipidemia: Secondary | ICD-10-CM | POA: Diagnosis not present

## 2022-09-12 DIAGNOSIS — G894 Chronic pain syndrome: Secondary | ICD-10-CM | POA: Diagnosis not present

## 2022-09-18 DIAGNOSIS — G301 Alzheimer's disease with late onset: Secondary | ICD-10-CM | POA: Diagnosis not present

## 2022-09-18 DIAGNOSIS — F02B0 Dementia in other diseases classified elsewhere, moderate, without behavioral disturbance, psychotic disturbance, mood disturbance, and anxiety: Secondary | ICD-10-CM | POA: Diagnosis not present

## 2022-10-03 DIAGNOSIS — D649 Anemia, unspecified: Secondary | ICD-10-CM | POA: Diagnosis not present

## 2022-10-03 DIAGNOSIS — E559 Vitamin D deficiency, unspecified: Secondary | ICD-10-CM | POA: Diagnosis not present

## 2022-10-03 DIAGNOSIS — E782 Mixed hyperlipidemia: Secondary | ICD-10-CM | POA: Diagnosis not present

## 2022-10-04 DIAGNOSIS — M79675 Pain in left toe(s): Secondary | ICD-10-CM | POA: Diagnosis not present

## 2022-10-04 DIAGNOSIS — B351 Tinea unguium: Secondary | ICD-10-CM | POA: Diagnosis not present

## 2022-10-04 DIAGNOSIS — M79674 Pain in right toe(s): Secondary | ICD-10-CM | POA: Diagnosis not present

## 2022-10-10 DIAGNOSIS — W19XXXA Unspecified fall, initial encounter: Secondary | ICD-10-CM | POA: Diagnosis not present

## 2022-10-10 DIAGNOSIS — G894 Chronic pain syndrome: Secondary | ICD-10-CM | POA: Diagnosis not present

## 2022-10-12 ENCOUNTER — Encounter (HOSPITAL_COMMUNITY): Payer: Self-pay

## 2022-10-12 ENCOUNTER — Other Ambulatory Visit: Payer: Self-pay

## 2022-10-12 ENCOUNTER — Emergency Department (HOSPITAL_COMMUNITY): Payer: Medicare Other

## 2022-10-12 ENCOUNTER — Emergency Department (HOSPITAL_COMMUNITY)
Admission: EM | Admit: 2022-10-12 | Discharge: 2022-10-12 | Disposition: A | Discharge status: Home / Self Care | Payer: Medicare Other | Attending: Emergency Medicine | Admitting: Emergency Medicine

## 2022-10-12 DIAGNOSIS — R410 Disorientation, unspecified: Secondary | ICD-10-CM | POA: Insufficient documentation

## 2022-10-12 DIAGNOSIS — R451 Restlessness and agitation: Secondary | ICD-10-CM | POA: Diagnosis not present

## 2022-10-12 DIAGNOSIS — F0390 Unspecified dementia without behavioral disturbance: Secondary | ICD-10-CM | POA: Diagnosis not present

## 2022-10-12 DIAGNOSIS — R519 Headache, unspecified: Secondary | ICD-10-CM | POA: Insufficient documentation

## 2022-10-12 DIAGNOSIS — E162 Hypoglycemia, unspecified: Secondary | ICD-10-CM | POA: Diagnosis not present

## 2022-10-12 DIAGNOSIS — R404 Transient alteration of awareness: Secondary | ICD-10-CM | POA: Diagnosis not present

## 2022-10-12 DIAGNOSIS — W19XXXA Unspecified fall, initial encounter: Secondary | ICD-10-CM | POA: Insufficient documentation

## 2022-10-12 DIAGNOSIS — Z7901 Long term (current) use of anticoagulants: Secondary | ICD-10-CM | POA: Diagnosis not present

## 2022-10-12 DIAGNOSIS — D649 Anemia, unspecified: Secondary | ICD-10-CM | POA: Diagnosis not present

## 2022-10-12 DIAGNOSIS — Z743 Need for continuous supervision: Secondary | ICD-10-CM | POA: Diagnosis not present

## 2022-10-12 DIAGNOSIS — S00411A Abrasion of right ear, initial encounter: Secondary | ICD-10-CM | POA: Diagnosis not present

## 2022-10-12 DIAGNOSIS — Y92129 Unspecified place in nursing home as the place of occurrence of the external cause: Secondary | ICD-10-CM | POA: Diagnosis not present

## 2022-10-12 DIAGNOSIS — E11649 Type 2 diabetes mellitus with hypoglycemia without coma: Secondary | ICD-10-CM | POA: Diagnosis not present

## 2022-10-12 DIAGNOSIS — S0990XA Unspecified injury of head, initial encounter: Secondary | ICD-10-CM | POA: Diagnosis not present

## 2022-10-12 DIAGNOSIS — E161 Other hypoglycemia: Secondary | ICD-10-CM | POA: Diagnosis not present

## 2022-10-12 DIAGNOSIS — R6889 Other general symptoms and signs: Secondary | ICD-10-CM | POA: Diagnosis not present

## 2022-10-12 DIAGNOSIS — M5030 Other cervical disc degeneration, unspecified cervical region: Secondary | ICD-10-CM | POA: Diagnosis not present

## 2022-10-12 LAB — URINALYSIS, ROUTINE W REFLEX MICROSCOPIC
Bilirubin Urine: NEGATIVE
Glucose, UA: NEGATIVE mg/dL
Ketones, ur: NEGATIVE mg/dL
Leukocytes,Ua: NEGATIVE
Nitrite: NEGATIVE
Protein, ur: 100 mg/dL — AB
Specific Gravity, Urine: 1.019 (ref 1.005–1.030)
pH: 5 (ref 5.0–8.0)

## 2022-10-12 LAB — CBG MONITORING, ED
Glucose-Capillary: 139 mg/dL — ABNORMAL HIGH (ref 70–99)
Glucose-Capillary: 73 mg/dL (ref 70–99)

## 2022-10-12 LAB — BASIC METABOLIC PANEL
Anion gap: 3 — ABNORMAL LOW (ref 5–15)
BUN: 27 mg/dL — ABNORMAL HIGH (ref 8–23)
CO2: 23 mmol/L (ref 22–32)
Calcium: 8.5 mg/dL — ABNORMAL LOW (ref 8.9–10.3)
Chloride: 108 mmol/L (ref 98–111)
Creatinine, Ser: 1.23 mg/dL (ref 0.61–1.24)
GFR, Estimated: 60 mL/min — ABNORMAL LOW (ref >60)
Glucose, Bld: 126 mg/dL — ABNORMAL HIGH (ref 70–99)
Potassium: 4.4 mmol/L (ref 3.5–5.1)
Sodium: 134 mmol/L — ABNORMAL LOW (ref 135–145)

## 2022-10-12 LAB — CBC
HCT: 31.7 % — ABNORMAL LOW (ref 39.0–52.0)
Hemoglobin: 10.5 g/dL — ABNORMAL LOW (ref 13.0–17.0)
MCH: 36.7 pg — ABNORMAL HIGH (ref 26.0–34.0)
MCHC: 33.1 g/dL (ref 30.0–36.0)
MCV: 110.8 fL — ABNORMAL HIGH (ref 80.0–100.0)
Platelets: 77 10*3/uL — ABNORMAL LOW (ref 150–400)
RBC: 2.86 MIL/uL — ABNORMAL LOW (ref 4.22–5.81)
RDW: 13.2 % (ref 11.5–15.5)
WBC: 3.8 10*3/uL — ABNORMAL LOW (ref 4.0–10.5)
nRBC: 0 % (ref 0.0–0.2)

## 2022-10-12 NOTE — ED Provider Notes (Signed)
[facility] AT [facility] Provider Note   CSN: [id number] Arrival date & time: 10/12/22  0310     History  Chief Complaint  Patient presents with   Fall   Hypoglycemia    Jay Campbell is a 77 y.o. male.  Sent to the emergency department after a fall.  Patient was found on the floor at nursing facility.  He seemed agitated, blood sugar was checked and it was 34.  He would not take oral glucose, EMS was called.  Patient was administered D10 with normalization of his blood sugar.  He initially was complaining of headache, now with no complaints at arrival.  He does have a history of dementia.       Home Medications Prior to Admission medications   Medication Sig Start Date End Date Taking? Authorizing Provider  allopurinol (ZYLOPRIM) 100 MG tablet Take 100 mg by mouth Daily at 5am. 12/15/11   [provider]  BD INSULIN SYRINGE U/F 31G X 5/16" 0.3 ML MISC  06/26/19   [provider]  cholecalciferol (VITAMIN D3) 25 MCG (1000 UNIT) tablet Take 1,000 Units by mouth daily.    [provider]  colchicine 0.6 MG tablet Take 0.6 mg by mouth as needed.    [provider]  Continuous Blood Gluc Sensor (FREESTYLE LIBRE 14 DAY SENSOR) MISC  06/02/21   [provider]  diphenhydrAMINE-APAP, sleep, (TYLENOL PM EXTRA STRENGTH) 50-1000 MG/30ML LIQD Take 1,000 mLs by mouth daily.    [provider]  donepezil (ARICEPT) 10 MG tablet Take 1 tablet (10 mg total) by mouth at bedtime. 01/13/22   Ihor Austin, NP  ELIQUIS 2.5 MG TABS tablet Take 2.5 mg by mouth 2 (two) times daily. 01/13/22   [provider]  famotidine (PEPCID) 20 MG tablet Take 20 mg by mouth 2 (two) times daily. 07/05/19   [provider]  ferrous sulfate 325 (65 FE) MG EC tablet Take 325 mg by mouth daily with breakfast.    [provider]  glipiZIDE (GLUCOTROL) 5 MG tablet Take 5 mg by mouth daily. 01/13/22   [provider]  HUMULIN 70/30 (70-30) 100 UNIT/ML injection Inject into the skin. 05/25/21   [provider]  hydrOXYzine (ATARAX) 10 MG tablet Take 10 mg by mouth 3 (three) times daily as needed for nausea.    [provider]  isosorbide mononitrate (IMDUR) 60 MG 24 hr tablet Take 1 tablet (60 mg total) by mouth daily. 04/17/20   Allred, Fayrene Fearing, MD  latanoprost (XALATAN) 0.005 % ophthalmic solution  02/23/21   [provider]  lisinopril (ZESTRIL) 10 MG tablet Take 10 mg by mouth every evening. 12/10/18   [provider]  meclizine (ANTIVERT) 25 MG tablet Take 25 mg by mouth 3 (three) times daily as needed for dizziness.    [provider]  memantine (NAMENDA) 10 MG tablet TAKE 1 TABLET BY MOUTH TWICE  DAILY 11/02/21   Ihor Austin, NP  Menthol, Topical Analgesic, (BIOFREEZE) 4 % GEL Apply topically as needed.    [provider]  metFORMIN (GLUCOPHAGE) 1000 MG tablet Take 1,000 mg by mouth 2 (two) times daily with a meal.    [provider]  metoprolol succinate (TOPROL-XL) 25 MG 24 hr tablet Take 1 tablet (25 mg total) by mouth daily. 05/09/22   Mealor, Roberts Gaudy, MD  nitroGLYCERIN (NITROSTAT) 0.4 MG SL tablet Place 1 tablet (0.4 mg total) under the tongue every 5 (five) minutes  x 3 doses as needed. 04/15/16   Hillis Range, MD  [facility] VERIO test strip  08/17/17   [provider]  sertraline (ZOLOFT) 100 MG tablet Take 100 mg by mouth daily. 04/19/19   [provider]  simvastatin (ZOCOR) 40 MG tablet Take 40 mg by mouth at bedtime.    [provider]  tamsulosin (FLOMAX) 0.4 MG CAPS capsule Take 0.4 mg by mouth daily. 09/03/20   [provider]      Allergies    Contrast media [iodinated contrast media], Vioxx [rofecoxib], Shellfish allergy, and Sulfonamide derivatives    Review of Systems   Review of Systems  Physical Exam Updated Vital Signs BP (!) 150/69   Pulse 60   Temp 97.7 F (36.5 C)  (Oral)   Resp 12   SpO2 90%  Physical Exam Vitals and nursing note reviewed.  Constitutional:      General: He is not in acute distress.    Appearance: He is well-developed.  HENT:     Head: Normocephalic.     Comments: Small abrasion lobule right ear    Mouth/Throat:     Mouth: Mucous membranes are moist.  Eyes:     General: Vision grossly intact. Gaze aligned appropriately.     Extraocular Movements: Extraocular movements intact.     Conjunctiva/sclera: Conjunctivae normal.  Cardiovascular:     Rate and Rhythm: Normal rate and regular rhythm.     Pulses: Normal pulses.     Heart sounds: Normal heart sounds, S1 normal and S2 normal. No murmur heard.    No friction rub. No gallop.  Pulmonary:     Effort: Pulmonary effort is normal. No respiratory distress.     Breath sounds: Normal breath sounds.  Abdominal:     Palpations: Abdomen is soft.     Tenderness: There is no abdominal tenderness. There is no guarding or rebound.     Hernia: No hernia is present.  Musculoskeletal:        General: No swelling.     Cervical back: Full passive range of motion without pain, normal range of motion and neck supple. No pain with movement, spinous process tenderness or muscular tenderness. Normal range of motion.     Right lower leg: No edema.     Left lower leg: No edema.  Skin:    General: Skin is warm and dry.     Capillary Refill: Capillary refill takes less than 2 seconds.     Findings: No ecchymosis, erythema, lesion or wound.  Neurological:     Mental Status: He is alert and oriented to person, place, and time.     GCS: GCS eye subscore is 4. GCS verbal subscore is 5. GCS motor subscore is 6.     Cranial Nerves: Cranial nerves 2-12 are intact.     Sensory: Sensation is intact.     Motor: Motor function is intact. No weakness or abnormal muscle tone.     Coordination: Coordination is intact.  Psychiatric:        Mood and Affect: Mood normal.        Speech: Speech normal.         Behavior: Behavior normal.     ED Results / Procedures / Treatments   Labs (all labs ordered are listed, but only abnormal results are displayed) Labs Reviewed  CBC - Abnormal; Notable for the following components:      Result Value   WBC 3.8 (*)    RBC 2.86 (*)  Hemoglobin 10.5 (*)    HCT 31.7 (*)    MCV 110.8 (*)    MCH 36.7 (*)    Platelets 77 (*)    All other components within normal limits  BASIC METABOLIC PANEL - Abnormal; Notable for the following components:   Sodium 134 (*)    Glucose, Bld 126 (*)    BUN 27 (*)    Calcium 8.5 (*)    GFR, Estimated 60 (*)    Anion gap 3 (*)    All other components within normal limits  URINALYSIS, ROUTINE W REFLEX MICROSCOPIC - Abnormal; Notable for the following components:   Hgb urine dipstick LARGE (*)    Protein, ur 100 (*)    Bacteria, UA RARE (*)    All other components within normal limits  CBG MONITORING, ED - Abnormal; Notable for the following components:   Glucose-Capillary 139 (*)    All other components within normal limits    EKG None  Radiology CT CERVICAL SPINE WO CONTRAST  Result Date: 10/12/2022 CLINICAL DATA:  Found on floor hypoglycemia. EXAM: CT CERVICAL SPINE WITHOUT CONTRAST TECHNIQUE: Multidetector CT imaging of the cervical spine was performed without intravenous contrast. Multiplanar CT image reconstructions were also generated. RADIATION DOSE REDUCTION: This exam was performed according to the departmental dose-optimization program which includes automated exposure control, adjustment of the mA and/or kV according to patient size and/or use of iterative reconstruction technique. COMPARISON:  None Available. FINDINGS: Alignment: Normal. Skull base and vertebrae: No acute fracture. No primary bone lesion or focal pathologic process. Subjective osteopenia. Soft tissues and spinal canal: No prevertebral fluid or swelling. No visible canal hematoma. Disc levels: Generalized degenerative endplate and facet  spurring, especially affecting facets. Upper chest: Clear apical lungs IMPRESSION: Negative for cervical spine fracture or subluxation. Electronically Signed   By: Tiburcio Pea M.D.   On: 10/12/2022 04:13   CT HEAD WO CONTRAST ( )  Result Date: 10/12/2022 CLINICAL DATA:  Head trauma, minor (Age >= 65y) EXAM: CT HEAD WITHOUT CONTRAST TECHNIQUE: Contiguous axial images were obtained from the base of the skull through the vertex without intravenous contrast. RADIATION DOSE REDUCTION: This exam was performed according to the departmental dose-optimization program which includes automated exposure control, adjustment of the mA and/or kV according to patient size and/or use of iterative reconstruction technique. COMPARISON:  CT head 02/07/2022, CT head 12/19/2021 FINDINGS: Brain: Cerebral ventricle sizes are concordant with the degree of cerebral volume loss. Patchy and confluent areas of decreased attenuation are noted throughout the deep and periventricular white matter of the cerebral hemispheres bilaterally, compatible with chronic microvascular ischemic disease. No evidence of large-territorial acute infarction. No parenchymal hemorrhage. No mass lesion. No extra-axial collection. No mass effect or midline shift. No hydrocephalus. Basilar cisterns are patent. Vascular: No hyperdense vessel. Skull: No acute fracture or focal lesion. Sinuses/Orbits: Paranasal sinuses and mastoid air cells are clear. The orbits are unremarkable. Other: None. IMPRESSION: No acute intracranial abnormality. Electronically Signed   By: Tish Frederickson M.D.   On: 10/12/2022 04:01    Procedures Procedures    Medications Ordered in ED Medications - No data to display  ED Course/ Medical Decision Making/ A&P                                 Medical Decision Making Amount and/or Complexity of Data Reviewed Labs: ordered. Radiology: ordered.   Differential diagnosis considered includes, but not limited to:  TIA; Stroke;  ICH; Seizure; electrolyte abnormality; hypoglycemia; toxic/pharmacologic causes; CNS infection; psychiatric disorder  Presents after he was found confused on the floor at nursing home.  Patient does have a history of dementia but was apparently more altered than usual.  He was found to be hypoglycemic.  Patient improved after administration of dextrose.  At arrival to the ED he is awake, alert without complaints.  CT head and cervical spine without injury.  Remainder of examination unremarkable.  No evidence of extremity injury.  Basic labs unremarkable.  Patient has maintained his blood sugar here in the ED, appropriate for return to skilled nursing facility.        Final Clinical Impression(s) / ED Diagnoses Final diagnoses:  Hypoglycemia    Rx / DC Orders ED Discharge Orders     None         Juniper Snyders, Canary Brim, MD 10/12/22 [phone]

## 2022-10-12 NOTE — ED Notes (Signed)
Pt placed on take home list to [facility]. Corie Vavra

## 2022-10-12 NOTE — ED Triage Notes (Addendum)
Pt from [facility] and found on floor. Pt sugar was 34 and given oral glucose. Pt given D10 by EMS and sugar went to 150. C/o head pain. Pt is on thinners.

## 2022-10-12 NOTE — ED Notes (Signed)
Patient transported to CT 

## 2022-10-14 ENCOUNTER — Encounter: Payer: Self-pay | Admitting: Cardiovascular Disease

## 2022-10-14 ENCOUNTER — Ambulatory Visit: Payer: Medicare Other | Attending: Cardiovascular Disease | Admitting: Cardiovascular Disease

## 2022-10-14 ENCOUNTER — Ambulatory Visit (INDEPENDENT_AMBULATORY_CARE_PROVIDER_SITE_OTHER): Payer: Medicare Other

## 2022-10-14 VITALS — BP 140/70 | HR 60 | Wt 184.2 lb

## 2022-10-14 DIAGNOSIS — I442 Atrioventricular block, complete: Secondary | ICD-10-CM

## 2022-10-14 LAB — CUP PACEART REMOTE DEVICE CHECK
Battery Remaining Longevity: 36 mo
Battery Remaining Percentage: 26 %
Battery Voltage: 2.95 V
Brady Statistic RV Percent Paced: 99 %
Date Time Interrogation Session: 20240802040014
Implantable Lead Connection Status: 753985
Implantable Lead Implant Date: 20020215
Implantable Lead Location: 753860
Implantable Pulse Generator Implant Date: 20141121
Lead Channel Impedance Value: 560 Ohm
Lead Channel Pacing Threshold Amplitude: 1 V
Lead Channel Pacing Threshold Pulse Width: 0.4 ms
Lead Channel Sensing Intrinsic Amplitude: 12 mV
Lead Channel Setting Pacing Amplitude: 1.25 V
Lead Channel Setting Pacing Pulse Width: 0.4 ms
Lead Channel Setting Sensing Sensitivity: 6 mV
Pulse Gen Model: 1240
Pulse Gen Serial Number: 7488472

## 2022-10-14 NOTE — Progress Notes (Signed)
  Electrophysiology Office Note:    Date:  10/14/2022   ID:  Jay Campbell, DOB 1946-03-14, MRN [id number]  PCP:  Kirstie Peri, MD   [facility] Providers Cardiologist:  Nona Dell, MD Electrophysiologist:  Maurice Small, MD     Referring MD: Kirstie Peri, MD   History of Present Illness:    Jay Campbell is a 77 y.o. male with a medical history significant for permanent atrial fibrillation and complete heart block, who is for electrophysiology follow-up.     He has a history of permanent atrial fibrillation and underwent AVJ ablation with pacemaker placement.  The generator was changed in November 2014.     Interval history  Since his last EP clinic visit, he has had 4 ER visits for falls. He reports that he is doing well -- he can't hear or see very well, but no new complaints.   EKGs/Labs/Other Studies Reviewed Today:    Echocardiogram:  TTE 04/19/2019 EF 60 to 65%.  Left atrium severely dilated.  Right atrium mild to moderately dilated.   Monitors:   Stress testing:   Advanced imaging:   Cardiac catherization   EKG:   EKG Interpretation Date/Time:  Friday October 14 2022 09:47:19 EDT Ventricular Rate:  60 PR Interval:    QRS Duration:  158 QT Interval:  460 QTC Calculation: 460 R Axis:   -78  Text Interpretation: Atrial fibrillation Ventricular-paced rhythm When compared with ECG of 03-Sep-2021 15:43, No significant change was found Confirmed by York Pellant [phone]) on 10/14/2022 9:56:30 AM     Physical Exam:    VS:  BP (!) 140/70   Pulse 60   Wt 184 lb 3.2 oz (83.6 kg)   SpO2 98%   BMI 25.69 kg/m     Wt Readings from Last 3 Encounters:  10/14/22 184 lb 3.2 oz (83.6 kg)  07/07/22 182 lb (82.6 kg)  05/19/22 185 lb (83.9 kg)     GEN:  Well nourished, well developed in no acute distress CARDIAC: RRR, no murmurs, rubs, gallops The device site is normal -- no tenderness, edema, drainage, redness, threatened  erosion. RESPIRATORY:  Normal work of breathing MUSCULOSKELETAL: no edema  Pacemaker interrogation -reviewed in detail today. see Paceart report   ASSESSMENT & PLAN:    Complete heart block due to AV nodal ablation Saint Jude pacemaker in place See Paceart report No changes today He is device dependent today  Permanent atrial fibrillation Rate control with AVJ ablation and pacemaker On Eliquis 2.5  Hypertension No changes today On metoprolol succinate 25 mg, Lisinopril 10   Signed, Maurice Small, MD  10/14/2022 9:56 AM    [facility]

## 2022-10-14 NOTE — Patient Instructions (Signed)
Medication Instructions:  Continue all current medications.  Labwork: none  Testing/Procedures: none  Follow-Up: 1 year   Any Other Special Instructions Will Be Listed Below (If Applicable).  If you need a refill on your cardiac medications before your next appointment, please call your pharmacy.  

## 2022-10-17 DIAGNOSIS — W19XXXA Unspecified fall, initial encounter: Secondary | ICD-10-CM | POA: Diagnosis not present

## 2022-10-17 DIAGNOSIS — E11649 Type 2 diabetes mellitus with hypoglycemia without coma: Secondary | ICD-10-CM | POA: Diagnosis not present

## 2022-10-17 DIAGNOSIS — R350 Frequency of micturition: Secondary | ICD-10-CM | POA: Diagnosis not present

## 2022-10-18 DIAGNOSIS — R350 Frequency of micturition: Secondary | ICD-10-CM | POA: Diagnosis not present

## 2022-10-18 DIAGNOSIS — R0602 Shortness of breath: Secondary | ICD-10-CM | POA: Diagnosis not present

## 2022-10-18 DIAGNOSIS — R0989 Other specified symptoms and signs involving the circulatory and respiratory systems: Secondary | ICD-10-CM | POA: Diagnosis not present

## 2022-10-18 DIAGNOSIS — N390 Urinary tract infection, site not specified: Secondary | ICD-10-CM | POA: Diagnosis not present

## 2022-10-21 DIAGNOSIS — E119 Type 2 diabetes mellitus without complications: Secondary | ICD-10-CM | POA: Diagnosis not present

## 2022-10-21 DIAGNOSIS — E785 Hyperlipidemia, unspecified: Secondary | ICD-10-CM | POA: Diagnosis not present

## 2022-10-21 DIAGNOSIS — K219 Gastro-esophageal reflux disease without esophagitis: Secondary | ICD-10-CM | POA: Diagnosis not present

## 2022-10-21 DIAGNOSIS — I5020 Unspecified systolic (congestive) heart failure: Secondary | ICD-10-CM | POA: Diagnosis not present

## 2022-10-21 DIAGNOSIS — M109 Gout, unspecified: Secondary | ICD-10-CM | POA: Diagnosis not present

## 2022-10-21 DIAGNOSIS — U071 COVID-19: Secondary | ICD-10-CM | POA: Diagnosis not present

## 2022-10-21 NOTE — Progress Notes (Signed)
Remote pacemaker transmission.   

## 2022-10-24 DIAGNOSIS — U071 COVID-19: Secondary | ICD-10-CM | POA: Diagnosis not present

## 2022-10-24 DIAGNOSIS — K219 Gastro-esophageal reflux disease without esophagitis: Secondary | ICD-10-CM | POA: Diagnosis not present

## 2022-10-24 DIAGNOSIS — E1169 Type 2 diabetes mellitus with other specified complication: Secondary | ICD-10-CM | POA: Diagnosis not present

## 2022-10-31 DIAGNOSIS — D649 Anemia, unspecified: Secondary | ICD-10-CM | POA: Diagnosis not present

## 2022-10-31 DIAGNOSIS — G894 Chronic pain syndrome: Secondary | ICD-10-CM | POA: Diagnosis not present

## 2022-11-01 DIAGNOSIS — F02B0 Dementia in other diseases classified elsewhere, moderate, without behavioral disturbance, psychotic disturbance, mood disturbance, and anxiety: Secondary | ICD-10-CM | POA: Diagnosis not present

## 2022-11-01 DIAGNOSIS — G301 Alzheimer's disease with late onset: Secondary | ICD-10-CM | POA: Diagnosis not present

## 2022-11-17 ENCOUNTER — Ambulatory Visit (HOSPITAL_COMMUNITY)
Admission: RE | Admit: 2022-11-17 | Discharge: 2022-11-17 | Disposition: A | Discharge status: Home / Self Care | Payer: Medicare Other | Source: Ambulatory Visit | Attending: Hematology | Admitting: Hematology

## 2022-11-17 ENCOUNTER — Inpatient Hospital Stay: Payer: Medicare Other | Attending: Hematology

## 2022-11-17 DIAGNOSIS — I4891 Unspecified atrial fibrillation: Secondary | ICD-10-CM | POA: Diagnosis not present

## 2022-11-17 DIAGNOSIS — M549 Dorsalgia, unspecified: Secondary | ICD-10-CM | POA: Insufficient documentation

## 2022-11-17 DIAGNOSIS — D631 Anemia in chronic kidney disease: Secondary | ICD-10-CM | POA: Diagnosis not present

## 2022-11-17 DIAGNOSIS — E1122 Type 2 diabetes mellitus with diabetic chronic kidney disease: Secondary | ICD-10-CM | POA: Insufficient documentation

## 2022-11-17 DIAGNOSIS — Z882 Allergy status to sulfonamides status: Secondary | ICD-10-CM | POA: Insufficient documentation

## 2022-11-17 DIAGNOSIS — C9000 Multiple myeloma not having achieved remission: Secondary | ICD-10-CM | POA: Diagnosis not present

## 2022-11-17 DIAGNOSIS — D472 Monoclonal gammopathy: Secondary | ICD-10-CM | POA: Insufficient documentation

## 2022-11-17 DIAGNOSIS — Z91041 Radiographic dye allergy status: Secondary | ICD-10-CM | POA: Diagnosis not present

## 2022-11-17 DIAGNOSIS — Z801 Family history of malignant neoplasm of trachea, bronchus and lung: Secondary | ICD-10-CM | POA: Insufficient documentation

## 2022-11-17 DIAGNOSIS — M4856XA Collapsed vertebra, not elsewhere classified, lumbar region, initial encounter for fracture: Secondary | ICD-10-CM | POA: Insufficient documentation

## 2022-11-17 DIAGNOSIS — Z79899 Other long term (current) drug therapy: Secondary | ICD-10-CM | POA: Insufficient documentation

## 2022-11-17 DIAGNOSIS — D696 Thrombocytopenia, unspecified: Secondary | ICD-10-CM | POA: Diagnosis not present

## 2022-11-17 DIAGNOSIS — Z8719 Personal history of other diseases of the digestive system: Secondary | ICD-10-CM | POA: Insufficient documentation

## 2022-11-17 DIAGNOSIS — M8580 Other specified disorders of bone density and structure, unspecified site: Secondary | ICD-10-CM | POA: Diagnosis not present

## 2022-11-17 DIAGNOSIS — Z825 Family history of asthma and other chronic lower respiratory diseases: Secondary | ICD-10-CM | POA: Diagnosis not present

## 2022-11-17 DIAGNOSIS — Z7901 Long term (current) use of anticoagulants: Secondary | ICD-10-CM | POA: Diagnosis not present

## 2022-11-17 DIAGNOSIS — N189 Chronic kidney disease, unspecified: Secondary | ICD-10-CM | POA: Diagnosis not present

## 2022-11-17 DIAGNOSIS — Z8249 Family history of ischemic heart disease and other diseases of the circulatory system: Secondary | ICD-10-CM | POA: Insufficient documentation

## 2022-11-17 DIAGNOSIS — Z950 Presence of cardiac pacemaker: Secondary | ICD-10-CM | POA: Diagnosis not present

## 2022-11-17 DIAGNOSIS — I700 Atherosclerosis of aorta: Secondary | ICD-10-CM | POA: Insufficient documentation

## 2022-11-17 LAB — COMPREHENSIVE METABOLIC PANEL
ALT: 21 U/L (ref 0–44)
AST: 27 U/L (ref 15–41)
Albumin: 4 g/dL (ref 3.5–5.0)
Alkaline Phosphatase: 113 U/L (ref 38–126)
Anion gap: 7 (ref 5–15)
BUN: 21 mg/dL (ref 8–23)
CO2: 26 mmol/L (ref 22–32)
Calcium: 9.1 mg/dL (ref 8.9–10.3)
Chloride: 103 mmol/L (ref 98–111)
Creatinine, Ser: 1.11 mg/dL (ref 0.61–1.24)
GFR, Estimated: >60 mL/min (ref >60)
Glucose, Bld: 192 mg/dL — ABNORMAL HIGH (ref 70–99)
Potassium: 5 mmol/L (ref 3.5–5.1)
Sodium: 136 mmol/L (ref 135–145)
Total Bilirubin: 1.1 mg/dL (ref 0.3–1.2)
Total Protein: 8 g/dL (ref 6.5–8.1)

## 2022-11-17 LAB — CBC WITH DIFFERENTIAL/PLATELET
Abs Immature Granulocytes: 0.01 10*3/uL (ref 0.00–0.07)
Basophils Absolute: 0 10*3/uL (ref 0.0–0.1)
Basophils Relative: 0 %
Eosinophils Absolute: 0.1 10*3/uL (ref 0.0–0.5)
Eosinophils Relative: 2 %
HCT: 34.1 % — ABNORMAL LOW (ref 39.0–52.0)
Hemoglobin: 11.4 g/dL — ABNORMAL LOW (ref 13.0–17.0)
Immature Granulocytes: 0 %
Lymphocytes Relative: 19 %
Lymphs Abs: 0.7 10*3/uL (ref 0.7–4.0)
MCH: 36.4 pg — ABNORMAL HIGH (ref 26.0–34.0)
MCHC: 33.4 g/dL (ref 30.0–36.0)
MCV: 108.9 fL — ABNORMAL HIGH (ref 80.0–100.0)
Monocytes Absolute: 0.4 10*3/uL (ref 0.1–1.0)
Monocytes Relative: 10 %
Neutro Abs: 2.6 10*3/uL (ref 1.7–7.7)
Neutrophils Relative %: 69 %
Platelets: 91 10*3/uL — ABNORMAL LOW (ref 150–400)
RBC: 3.13 MIL/uL — ABNORMAL LOW (ref 4.22–5.81)
RDW: 13.8 % (ref 11.5–15.5)
WBC: 3.8 10*3/uL — ABNORMAL LOW (ref 4.0–10.5)
nRBC: 0 % (ref 0.0–0.2)

## 2022-11-17 LAB — IRON AND TIBC
Iron: 137 ug/dL (ref 45–182)
Saturation Ratios: 38 % (ref 17.9–39.5)
TIBC: 359 ug/dL (ref 250–450)
UIBC: 222 ug/dL

## 2022-11-17 LAB — FERRITIN: Ferritin: 46 ng/mL (ref 24–336)

## 2022-11-18 ENCOUNTER — Inpatient Hospital Stay: Payer: Medicare Other

## 2022-11-18 LAB — KAPPA/LAMBDA LIGHT CHAINS
Kappa free light chain: 58.4 mg/L — ABNORMAL HIGH (ref 3.3–19.4)
Kappa, lambda light chain ratio: 2.44 — ABNORMAL HIGH (ref 0.26–1.65)
Lambda free light chains: 23.9 mg/L (ref 5.7–26.3)

## 2022-11-21 DIAGNOSIS — M545 Low back pain, unspecified: Secondary | ICD-10-CM | POA: Diagnosis not present

## 2022-11-21 DIAGNOSIS — E782 Mixed hyperlipidemia: Secondary | ICD-10-CM | POA: Diagnosis not present

## 2022-11-21 LAB — PROTEIN ELECTROPHORESIS, SERUM
A/G Ratio: 0.9 (ref 0.7–1.7)
Albumin ELP: 3.7 g/dL (ref 2.9–4.4)
Alpha-1-Globulin: 0.3 g/dL (ref 0.0–0.4)
Alpha-2-Globulin: 0.6 g/dL (ref 0.4–1.0)
Beta Globulin: 0.9 g/dL (ref 0.7–1.3)
Gamma Globulin: 2.2 g/dL — ABNORMAL HIGH (ref 0.4–1.8)
Globulin, Total: 4 g/dL — ABNORMAL HIGH (ref 2.2–3.9)
M-Spike, %: 1.7 g/dL — ABNORMAL HIGH
Total Protein ELP: 7.7 g/dL (ref 6.0–8.5)

## 2022-11-24 ENCOUNTER — Inpatient Hospital Stay (HOSPITAL_BASED_OUTPATIENT_CLINIC_OR_DEPARTMENT_OTHER): Payer: Medicare Other | Admitting: Hematology

## 2022-11-24 VITALS — BP 160/65 | HR 61 | Temp 97.9°F | Resp 17 | Ht 71.0 in | Wt 179.8 lb

## 2022-11-24 DIAGNOSIS — Z801 Family history of malignant neoplasm of trachea, bronchus and lung: Secondary | ICD-10-CM | POA: Diagnosis not present

## 2022-11-24 DIAGNOSIS — M4856XA Collapsed vertebra, not elsewhere classified, lumbar region, initial encounter for fracture: Secondary | ICD-10-CM | POA: Diagnosis not present

## 2022-11-24 DIAGNOSIS — Z79899 Other long term (current) drug therapy: Secondary | ICD-10-CM | POA: Diagnosis not present

## 2022-11-24 DIAGNOSIS — N189 Chronic kidney disease, unspecified: Secondary | ICD-10-CM | POA: Diagnosis not present

## 2022-11-24 DIAGNOSIS — D509 Iron deficiency anemia, unspecified: Secondary | ICD-10-CM | POA: Diagnosis not present

## 2022-11-24 DIAGNOSIS — C9000 Multiple myeloma not having achieved remission: Secondary | ICD-10-CM | POA: Diagnosis not present

## 2022-11-24 DIAGNOSIS — E1122 Type 2 diabetes mellitus with diabetic chronic kidney disease: Secondary | ICD-10-CM | POA: Diagnosis not present

## 2022-11-24 DIAGNOSIS — Z882 Allergy status to sulfonamides status: Secondary | ICD-10-CM | POA: Diagnosis not present

## 2022-11-24 DIAGNOSIS — D696 Thrombocytopenia, unspecified: Secondary | ICD-10-CM | POA: Diagnosis not present

## 2022-11-24 DIAGNOSIS — D472 Monoclonal gammopathy: Secondary | ICD-10-CM

## 2022-11-24 DIAGNOSIS — M8580 Other specified disorders of bone density and structure, unspecified site: Secondary | ICD-10-CM | POA: Diagnosis not present

## 2022-11-24 DIAGNOSIS — I700 Atherosclerosis of aorta: Secondary | ICD-10-CM | POA: Diagnosis not present

## 2022-11-24 DIAGNOSIS — D631 Anemia in chronic kidney disease: Secondary | ICD-10-CM | POA: Diagnosis not present

## 2022-11-24 DIAGNOSIS — Z91041 Radiographic dye allergy status: Secondary | ICD-10-CM | POA: Diagnosis not present

## 2022-11-24 DIAGNOSIS — I4891 Unspecified atrial fibrillation: Secondary | ICD-10-CM | POA: Diagnosis not present

## 2022-11-24 DIAGNOSIS — Z7901 Long term (current) use of anticoagulants: Secondary | ICD-10-CM | POA: Diagnosis not present

## 2022-11-24 DIAGNOSIS — Z8249 Family history of ischemic heart disease and other diseases of the circulatory system: Secondary | ICD-10-CM | POA: Diagnosis not present

## 2022-11-24 DIAGNOSIS — M549 Dorsalgia, unspecified: Secondary | ICD-10-CM | POA: Diagnosis not present

## 2022-11-24 DIAGNOSIS — Z8719 Personal history of other diseases of the digestive system: Secondary | ICD-10-CM | POA: Diagnosis not present

## 2022-11-24 DIAGNOSIS — Z825 Family history of asthma and other chronic lower respiratory diseases: Secondary | ICD-10-CM | POA: Diagnosis not present

## 2022-11-24 NOTE — Progress Notes (Signed)
Jay Campbell [address] [place] [address]    Clinic Day:  11/24/2022  Referring physician: Kirstie Peri, MD  Patient Care Team: Jay Peri, MD as PCP - General (Internal Medicine) Jay Sidle, MD as PCP - Cardiology (Cardiology) Jay Campbell, Jay Gaudy, MD as PCP - Electrophysiology (Cardiology)   ASSESSMENT & PLAN:   Assessment: 1.  IgA kappa smoldering myeloma: -Bone marrow biopsy on 09/05/2017 shows 12% plasma cells. -Skeletal survey on 11/29/2019 was negative for lytic lesions.   2.  Macrocytic anemia: -Hemoglobin is 12 and hematocrit is 36.  MCV is 107.  Previous work-up for B12, folic acid was normal.   3.  Thrombocytopenia: -He has mild to moderate thrombocytopenia.  Platelet count is 99. -CT of the abdomen and pelvis on 04/11/2017 showed normal spleen. -Bone marrow biopsy showed normal megakaryocytes.  Most likely etiology is ITP.  Plan: 1.  IgA kappa smoldering myeloma: - No new bone pains.  No infections. - Labs from 11/17/2022: Normal creatinine and calcium.  Hemoglobin is 11.4.  M spike is 1.7 and stable.  FLC ratio is 2.44 with kappa light chain 58. - Skeletal survey from 11/17/2022: No lytic lesions. - RTC 6 months for follow-up with repeat labs.   2.  Macrocytic anemia: - Anemia from CKD and functional iron deficiency. - Ferritin decreased to 46 from 132.  Hemoglobin is 11.4. - Recommend Feraheme weekly x 2.   3.  Thrombocytopenia: - This is likely from splenomegaly.  No evidence of bleeding.  Orders Placed This Encounter  Procedures   CBC with Differential/Platelet    Standing Status:   Future    Standing Expiration Date:   11/24/2023    Order Specific Question:   Release to patient    Answer:   Immediate   Comprehensive metabolic panel    Standing Status:   Future    Standing Expiration Date:   11/24/2023    Order Specific Question:   Release to patient    Answer:   Immediate   Ferritin    Standing Status:   Future     Standing Expiration Date:   11/24/2023    Order Specific Question:   Release to patient    Answer:   Immediate   Iron and TIBC    Standing Status:   Future    Standing Expiration Date:   11/24/2023    Order Specific Question:   Release to patient    Answer:   Immediate   Protein electrophoresis, serum    Standing Status:   Future    Standing Expiration Date:   11/24/2023    Order Specific Question:   Release to patient    Answer:   Immediate   Kappa/lambda light chains    Standing Status:   Future    Standing Expiration Date:   11/24/2023      Jay Campbell,acting as a scribe for Jay Massed, MD.,have documented all relevant documentation on the behalf of Jay Massed, MD,as directed by  Jay Massed, MD while in the presence of Jay Massed, MD.  I, Jay Massed MD, have reviewed the above documentation for accuracy and completeness, and I agree with the above.    Jay Massed, MD   9/12/20244:30 PM  CHIEF COMPLAINT:   Diagnosis: smoldering multiple myeloma, macrocytic anemia and thrombocytopenia    Cancer Staging  No matching staging information was found for the patient.    Prior Therapy: none  Current Therapy:  surveillance  HISTORY OF PRESENT ILLNESS:   Oncology History   No history exists.     INTERVAL HISTORY:   Jay Campbell is a 77 y.o. male presenting to clinic today for follow up of smoldering multiple myeloma, macrocytic anemia and thrombocytopenia. He was last seen by me on 05/19/22.  Since his last visit, he underwent a bone survey on 11/17/22 that found: no focal lucent bone lesions of myeloma identified and chronic severe compression deformity of the L1 vertebra.  He presented to the ED on 10/12/22 for a fall and hypoglycemia. He was administered D10 with normalization of his blood sugar. I independently viewed and interpreted CT head and cervical spine which found no acute injuries.   Today, he states that he is  doing well overall. His appetite level is at 100%. His energy level is at 75%. He is accompanied by his wife.   He denies any new pain, neuropathy, or bleeding issues. Back pain is stable from last visit. His wife reports he has had 1 UTI in the last 6 months.   PAST MEDICAL HISTORY:   Past Medical History: Past Medical History:  Diagnosis Date   Alzheimer's dementia (HCC)    Anxiety    Atrial fibrillation (HCC)    Permanent   Coronary artery disease    Mild nonobstructive 1/12   DM (diabetes mellitus) (HCC)    GERD (gastroesophageal reflux disease)    Gout    Hearing disorder, cochlear    Hiatal hernia    HLD (hyperlipidemia)    HTN (hypertension)    S/P AV nodal ablation    s/p SJM PPM; gen change 02-01-13 by Dr Johney Frame   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   Warfarin anticoagulation     Surgical History: Past Surgical History:  Procedure Laterality Date   CATARACT EXTRACTION Right    COCHLEAR IMPLANT Right    HEMORRHOID BANDING     KNEE ARTHROSCOPY Right 1992   Dr Hilda Lias    PACEMAKER GENERATOR CHANGE Bilateral 02/01/2013   Procedure: PACEMAKER GENERATOR CHANGE;  Surgeon: Gardiner Rhyme, MD;  Location: [facility];  Service: Cardiovascular;  Laterality: Bilateral;   PACEMAKER PLACEMENT  2002; 2014   SJM implanted by Dr Ladona Ridgel with AV nodal ablation performed; gen change to [employer] SR RF by Dr Johney Frame 02-01-13   RETINAL LASER PROCEDURE Left    SLT LASER APPLICATION Left 12/14/2015   Procedure: SLT LASER APPLICATION;  Surgeon: Susa Simmonds, MD;  Location: [facility];  Service: Ophthalmology;  Laterality: Left;    Social History: Social History   Socioeconomic History   Marital status: Married    Spouse name: Not on file   Number of children: Not on file   Years of education: 11   Highest education level: Not on file  Occupational History   Not on file  Tobacco Use   Smoking status: Never   Smokeless tobacco: Never  Vaping Use   Vaping status: Never Used   Substance and Sexual Activity   Alcohol use: No    Alcohol/week: 0.0 standard drinks of alcohol   Drug use: No   Sexual activity: Not Currently  Other Topics Concern   Not on file  Social History Narrative   Not on file   Social Determinants of Health   Financial Resource Strain: Not on file  Food Insecurity: Not on file  Transportation Needs: Not on file  Physical Activity: Not on file  Stress: Not on file  Social Connections: Not on file  Intimate  Partner Violence: Not on file    Family History: Family History  Problem Relation Age of Onset   COPD Mother    Emphysema Mother    Cancer - Lung Father    Heart disease Paternal Uncle    Stomach cancer Neg Hx    Colon cancer Neg Hx     Current Medications:  Current Outpatient Medications:    allopurinol (ZYLOPRIM) 100 MG tablet, Take 100 mg by mouth Daily at 5am., Disp: , Rfl:    BD INSULIN SYRINGE U/F 31G X 5/16" 0.3 ML MISC, , Disp: , Rfl:    cholecalciferol (VITAMIN D3) 25 MCG (1000 UNIT) tablet, Take 1,000 Units by mouth daily., Disp: , Rfl:    colchicine 0.6 MG tablet, Take 0.6 mg by mouth as needed., Disp: , Rfl:    Continuous Blood Gluc Sensor (FREESTYLE LIBRE 14 DAY SENSOR) MISC, , Disp: , Rfl:    diphenhydrAMINE-APAP, sleep, (TYLENOL PM EXTRA STRENGTH) 50-1000 MG/30ML LIQD, Take 1,000 mLs by mouth daily., Disp: , Rfl:    donepezil (ARICEPT) 10 MG tablet, Take 1 tablet (10 mg total) by mouth at bedtime., Disp: 30 tablet, Rfl: 11   ELIQUIS 2.5 MG TABS tablet, Take 2.5 mg by mouth 2 (two) times daily., Disp: , Rfl:    famotidine (PEPCID) 20 MG tablet, Take 20 mg by mouth 2 (two) times daily., Disp: , Rfl:    ferrous sulfate 325 (65 FE) MG EC tablet, Take 325 mg by mouth daily with breakfast., Disp: , Rfl:    glipiZIDE (GLUCOTROL) 5 MG tablet, Take 5 mg by mouth daily., Disp: , Rfl:    HUMULIN 70/30 (70-30) 100 UNIT/ML injection, Inject into the skin., Disp: , Rfl:    HYDROcodone-acetaminophen (NORCO/VICODIN) 5-325  MG tablet, Take 0.5 tablets by mouth daily., Disp: , Rfl:    hydrOXYzine (ATARAX) 10 MG tablet, Take 10 mg by mouth 3 (three) times daily as needed for nausea., Disp: , Rfl:    isosorbide mononitrate (IMDUR) 60 MG 24 hr tablet, Take 1 tablet (60 mg total) by mouth daily., Disp: 30 tablet, Rfl: 6   latanoprost (XALATAN) 0.005 % ophthalmic solution, , Disp: , Rfl:    lisinopril (ZESTRIL) 10 MG tablet, Take 10 mg by mouth every evening., Disp: , Rfl:    LORazepam (ATIVAN) 0.5 MG tablet, Take 0.25 mg by mouth every 6 (six) hours as needed., Disp: , Rfl:    LUMIGAN 0.01 % SOLN, Place 1 drop into both eyes., Disp: , Rfl:    meclizine (ANTIVERT) 25 MG tablet, Take 25 mg by mouth 3 (three) times daily as needed for dizziness., Disp: , Rfl:    memantine (NAMENDA) 10 MG tablet, TAKE 1 TABLET BY MOUTH TWICE  DAILY, Disp: 200 tablet, Rfl: 2   Menthol, Topical Analgesic, (BIOFREEZE) 4 % GEL, Apply topically as needed., Disp: , Rfl:    metFORMIN (GLUCOPHAGE) 1000 MG tablet, Take 1,000 mg by mouth 2 (two) times daily with a meal., Disp: , Rfl:    metoprolol succinate (TOPROL-XL) 25 MG 24 hr tablet, Take 1 tablet (25 mg total) by mouth daily., Disp: 90 tablet, Rfl: 3   nitroGLYCERIN (NITROSTAT) 0.4 MG SL tablet, Place 1 tablet (0.4 mg total) under the tongue every 5 (five) minutes x 3 doses as needed., Disp: 25 tablet, Rfl: 3   ONETOUCH VERIO test strip, , Disp: , Rfl:    sertraline (ZOLOFT) 100 MG tablet, Take 100 mg by mouth daily., Disp: , Rfl:    simvastatin (ZOCOR)  40 MG tablet, Take 40 mg by mouth at bedtime., Disp: , Rfl:    tamsulosin (FLOMAX) 0.4 MG CAPS capsule, Take 0.4 mg by mouth daily., Disp: , Rfl:    Allergies: Allergies  Allergen Reactions   Contrast Media [Iodinated Contrast Media]    Vioxx [Rofecoxib]     On pt MAR   Shellfish Allergy Itching and Rash   Sulfonamide Derivatives Itching and Rash    REVIEW OF SYSTEMS:   Review of Systems  Constitutional:  Negative for chills, fatigue  and fever.  HENT:   Negative for lump/mass, mouth sores, nosebleeds, sore throat and trouble swallowing.   Eyes:  Negative for eye problems.  Respiratory:  Negative for cough and shortness of breath.   Cardiovascular:  Negative for chest pain, leg swelling and palpitations.  Gastrointestinal:  Negative for abdominal pain, constipation, diarrhea, nausea and vomiting.  Genitourinary:  Negative for bladder incontinence, difficulty urinating, dysuria, frequency, hematuria and nocturia.        +urinary urgency  Musculoskeletal:  Positive for back pain (7/10 severity). Negative for arthralgias, flank pain, myalgias and neck pain.  Skin:  Negative for itching and rash.  Neurological:  Negative for dizziness, headaches and numbness.  Hematological:  Does not bruise/bleed easily.  Psychiatric/Behavioral:  Negative for depression, sleep disturbance and suicidal ideas. The patient is not nervous/anxious.   All other systems reviewed and are negative.    VITALS:   Blood pressure (!) 160/65, pulse 61, temperature 97.9 F (36.6 C), temperature source Oral, resp. rate 17, height 5\' 11"  (1.803 m), weight 179 lb 12.8 oz (81.6 kg), SpO2 100%.  Wt Readings from Last 3 Encounters:  11/24/22 179 lb 12.8 oz (81.6 kg)  10/14/22 184 lb 3.2 oz (83.6 kg)  07/07/22 182 lb (82.6 kg)    Body mass index is 25.08 kg/m.  Performance status (ECOG): 1 - Symptomatic but completely ambulatory  PHYSICAL EXAM:   Physical Exam Vitals and nursing note reviewed. Exam conducted with a chaperone present.  Constitutional:      Appearance: Normal appearance.  Cardiovascular:     Rate and Rhythm: Normal rate and regular rhythm.     Pulses: Normal pulses.     Heart sounds: Normal heart sounds.  Pulmonary:     Effort: Pulmonary effort is normal.     Breath sounds: Normal breath sounds.  Abdominal:     Palpations: Abdomen is soft. There is no hepatomegaly, splenomegaly or mass.     Tenderness: There is no abdominal  tenderness.  Musculoskeletal:     Right lower leg: No edema.     Left lower leg: No edema.  Lymphadenopathy:     Cervical: No cervical adenopathy.     Right cervical: No superficial, deep or posterior cervical adenopathy.    Left cervical: No superficial, deep or posterior cervical adenopathy.     Upper Body:     Right upper body: No supraclavicular or axillary adenopathy.     Left upper body: No supraclavicular or axillary adenopathy.  Neurological:     General: No focal deficit present.     Mental Status: He is alert and oriented to person, place, and time.  Psychiatric:        Mood and Affect: Mood normal.        Behavior: Behavior normal.     LABS:      Latest Ref Rng & Units 11/17/2022   12:48 PM 10/12/2022    3:55 AM 05/12/2022    9:58 AM  CBC  WBC 4.0 - 10.5 K/uL 3.8  3.8  4.0   Hemoglobin 13.0 - 17.0 g/dL 16.1  09.6  04.5   Hematocrit 39.0 - 52.0 % 34.1  31.7  35.4   Platelets 150 - 400 K/uL 91  77  88       Latest Ref Rng & Units 11/17/2022   12:48 PM 10/12/2022    3:55 AM 05/12/2022    9:58 AM  CMP  Glucose 70 - 99 mg/dL 409  811  914   BUN 8 - 23 mg/dL 21  27  23    Creatinine 0.61 - 1.24 mg/dL 7.82  9.56  2.13   Sodium 135 - 145 mmol/L 136  134  136   Potassium 3.5 - 5.1 mmol/L 5.0  4.4  4.6   Chloride 98 - 111 mmol/L 103  108  105   CO2 22 - 32 mmol/L 26  23  24    Calcium 8.9 - 10.3 mg/dL 9.1  8.5  9.1   Total Protein 6.5 - 8.1 g/dL 8.0   8.0   Total Bilirubin 0.3 - 1.2 mg/dL 1.1   0.9   Alkaline Phos 38 - 126 U/L 113   115   AST 15 - 41 U/L 27   25   ALT 0 - 44 U/L 21   24      No results found for: "CEA1", "CEA" / No results found for: "CEA1", "CEA" No results found for: "PSA1" No results found for: "CAN199" No results found for: "CAN125"  Lab Results  Component Value Date   TOTALPROTELP 7.7 11/17/2022   ALBUMINELP 3.7 11/17/2022   A1GS 0.3 11/17/2022   A2GS 0.6 11/17/2022   BETS 0.9 11/17/2022   GAMS 2.2 (H) 11/17/2022   MSPIKE 1.7 (H)  11/17/2022   SPEI Comment 11/17/2022   Lab Results  Component Value Date   TIBC 359 11/17/2022   TIBC 319 05/12/2022   TIBC 376 01/03/2022   FERRITIN 46 11/17/2022   FERRITIN 132 05/12/2022   FERRITIN 31 01/03/2022   IRONPCTSAT 38 11/17/2022   IRONPCTSAT 40 (H) 05/12/2022   IRONPCTSAT 21 01/03/2022   Lab Results  Component Value Date   LDH 133 01/03/2022   LDH 161 06/02/2021   LDH 128 10/21/2020     STUDIES:   DG Bone Survey Met  Result Date: 11/24/2022 CLINICAL DATA:  Small during myeloma EXAM: METASTATIC BONE SURVEY COMPARISON:  08/23/2018 FINDINGS: The bones appear diffusely osteopenic. No focal lucent bone lesions of myeloma identified. There is a severe compression deformity involving the L1 vertebra which is unchanged from 04/23/22. Scattered degenerative changes. Left chest wall pacer device. Aortic atherosclerotic calcifications. IMPRESSION: 1. No focal lucent bone lesions of myeloma identified. 2. Chronic severe compression deformity of the L1 vertebra. 3. Aortic atherosclerosis. Electronically Signed   By: Signa Kell M.D.   On: 11/24/2022 09:16

## 2022-11-24 NOTE — Patient Instructions (Signed)
[facility]  Discharge Instructions  You were seen and examined today by Dr. Ellin Saba.  Dr. Ellin Saba discussed your most recent lab work which revealed that your iron is low.  Dr. Ellin Saba recommends you having IV iron infusions.  Follow-up as scheduled.    Thank you for choosing [facility] to provide your oncology and hematology care.   To afford each patient quality time with our provider, please arrive at least 15 minutes before your scheduled appointment time. You may need to reschedule your appointment if you arrive late (10 or more minutes). Arriving late affects you and other patients whose appointments are after yours.  Also, if you miss three or more appointments without notifying the office, you may be dismissed from the clinic at the provider's discretion.    Again, thank you for choosing [facility].  Our hope is that these requests will decrease the amount of time that you wait before being seen by our physicians.   If you have a lab appointment with the Cancer Center - please note that after April 8th, all labs will be drawn in the cancer center.  You do not have to check in or register with the main entrance as you have in the past but will complete your check-in at the cancer center.            _____________________________________________________________  Should you have questions after your visit to [facility], please contact our office at [phone] and follow the prompts.  Our office hours are 8:00 a.m. to 4:30 p.m. Monday - Thursday and 8:00 a.m. to 2:30 p.m. Friday.  Please note that voicemails left after 4:00 p.m. may not be returned until the following business day.  We are closed weekends and all major holidays.  You do have access to a nurse 24-7, just call the main number to the clinic [phone] and do not press any options, hold on the line and a nurse will answer the  phone.    For prescription refill requests, have your pharmacy contact our office and allow 72 hours.    Masks are no longer required in the cancer centers. If you would like for your care team to wear a mask while they are taking care of you, please let them know. You may have one support person who is at least 79 years old accompany you for your appointments.

## 2022-11-28 ENCOUNTER — Emergency Department (HOSPITAL_COMMUNITY): Payer: Medicare Other

## 2022-11-28 ENCOUNTER — Encounter (HOSPITAL_COMMUNITY): Payer: Self-pay | Admitting: Emergency Medicine

## 2022-11-28 ENCOUNTER — Inpatient Hospital Stay: Payer: Medicare Other

## 2022-11-28 ENCOUNTER — Other Ambulatory Visit: Payer: Self-pay

## 2022-11-28 ENCOUNTER — Emergency Department (HOSPITAL_COMMUNITY)
Admission: EM | Admit: 2022-11-28 | Discharge: 2022-11-28 | Disposition: A | Discharge status: Home / Self Care | Payer: Medicare Other | Attending: Emergency Medicine | Admitting: Emergency Medicine

## 2022-11-28 VITALS — BP 153/75 | HR 61 | Temp 97.1°F | Resp 18

## 2022-11-28 DIAGNOSIS — Z794 Long term (current) use of insulin: Secondary | ICD-10-CM | POA: Insufficient documentation

## 2022-11-28 DIAGNOSIS — Z7901 Long term (current) use of anticoagulants: Secondary | ICD-10-CM | POA: Insufficient documentation

## 2022-11-28 DIAGNOSIS — W19XXXA Unspecified fall, initial encounter: Secondary | ICD-10-CM | POA: Diagnosis not present

## 2022-11-28 DIAGNOSIS — J90 Pleural effusion, not elsewhere classified: Secondary | ICD-10-CM | POA: Diagnosis not present

## 2022-11-28 DIAGNOSIS — S2241XA Multiple fractures of ribs, right side, initial encounter for closed fracture: Secondary | ICD-10-CM | POA: Diagnosis not present

## 2022-11-28 DIAGNOSIS — F0390 Unspecified dementia without behavioral disturbance: Secondary | ICD-10-CM | POA: Diagnosis not present

## 2022-11-28 DIAGNOSIS — S60012A Contusion of left thumb without damage to nail, initial encounter: Secondary | ICD-10-CM | POA: Diagnosis not present

## 2022-11-28 DIAGNOSIS — S0990XA Unspecified injury of head, initial encounter: Secondary | ICD-10-CM | POA: Diagnosis not present

## 2022-11-28 DIAGNOSIS — S61309A Unspecified open wound of unspecified finger with damage to nail, initial encounter: Secondary | ICD-10-CM | POA: Diagnosis not present

## 2022-11-28 DIAGNOSIS — S51011A Laceration without foreign body of right elbow, initial encounter: Secondary | ICD-10-CM | POA: Diagnosis not present

## 2022-11-28 DIAGNOSIS — I517 Cardiomegaly: Secondary | ICD-10-CM | POA: Diagnosis not present

## 2022-11-28 DIAGNOSIS — W010XXA Fall on same level from slipping, tripping and stumbling without subsequent striking against object, initial encounter: Secondary | ICD-10-CM | POA: Diagnosis not present

## 2022-11-28 DIAGNOSIS — W1830XA Fall on same level, unspecified, initial encounter: Secondary | ICD-10-CM | POA: Diagnosis not present

## 2022-11-28 DIAGNOSIS — R9089 Other abnormal findings on diagnostic imaging of central nervous system: Secondary | ICD-10-CM | POA: Diagnosis not present

## 2022-11-28 DIAGNOSIS — R6889 Other general symptoms and signs: Secondary | ICD-10-CM | POA: Diagnosis not present

## 2022-11-28 DIAGNOSIS — R0789 Other chest pain: Secondary | ICD-10-CM | POA: Diagnosis not present

## 2022-11-28 DIAGNOSIS — I700 Atherosclerosis of aorta: Secondary | ICD-10-CM | POA: Diagnosis not present

## 2022-11-28 DIAGNOSIS — R0781 Pleurodynia: Secondary | ICD-10-CM | POA: Diagnosis not present

## 2022-11-28 DIAGNOSIS — I6782 Cerebral ischemia: Secondary | ICD-10-CM | POA: Diagnosis not present

## 2022-11-28 DIAGNOSIS — M19042 Primary osteoarthritis, left hand: Secondary | ICD-10-CM | POA: Diagnosis not present

## 2022-11-28 DIAGNOSIS — D509 Iron deficiency anemia, unspecified: Secondary | ICD-10-CM

## 2022-11-28 DIAGNOSIS — S6992XA Unspecified injury of left wrist, hand and finger(s), initial encounter: Secondary | ICD-10-CM | POA: Diagnosis not present

## 2022-11-28 DIAGNOSIS — M79645 Pain in left finger(s): Secondary | ICD-10-CM | POA: Diagnosis present

## 2022-11-28 DIAGNOSIS — S199XXA Unspecified injury of neck, initial encounter: Secondary | ICD-10-CM | POA: Diagnosis not present

## 2022-11-28 DIAGNOSIS — Z743 Need for continuous supervision: Secondary | ICD-10-CM | POA: Diagnosis not present

## 2022-11-28 MED ORDER — SODIUM CHLORIDE 0.9 % IV SOLN: Freq: Once | INTRAVENOUS | Status: AC

## 2022-11-28 MED ORDER — SODIUM CHLORIDE 0.9 % IV SOLN
510.0000 mg | Freq: Once | INTRAVENOUS | Status: AC
  Administered 2022-11-28: 510 mg via INTRAVENOUS
  Filled 2022-11-28: qty 510

## 2022-11-28 MED ORDER — CETIRIZINE HCL 10 MG PO TABS
10.0000 mg | ORAL_TABLET | Freq: Once | ORAL | Status: AC
  Administered 2022-11-28: 10 mg via ORAL
  Filled 2022-11-28: qty 1

## 2022-11-28 MED ORDER — ACETAMINOPHEN 325 MG PO TABS
650.0000 mg | ORAL_TABLET | Freq: Once | ORAL | Status: AC
  Administered 2022-11-28: 650 mg via ORAL
  Filled 2022-11-28: qty 2

## 2022-11-28 NOTE — ED Provider Notes (Signed)
[place] EMERGENCY DEPARTMENT AT [facility]  Provider Note  CSN: [id number] Arrival date & time: 11/28/22 0248  History Chief Complaint  Patient presents with   Jay Campbell is a 77 y.o. male with history of dementia, afib on AC brought by EMS from SNF for unwitnessed fall. Patient unsure how he fell. He was found with blood on R elbow and L thumb and complained of R rib pain enroute. He denies head injury.    Home Medications Prior to Admission medications   Medication Sig Start Date End Date Taking? Authorizing Provider  allopurinol (ZYLOPRIM) 100 MG tablet Take 100 mg by mouth Daily at 5am. 12/15/11   [provider]  BD INSULIN SYRINGE U/F 31G X 5/16" 0.3 ML MISC  06/26/19   [provider]  cholecalciferol (VITAMIN D3) 25 MCG (1000 UNIT) tablet Take 1,000 Units by mouth daily.    [provider]  colchicine 0.6 MG tablet Take 0.6 mg by mouth as needed.    [provider]  Continuous Blood Gluc Sensor (FREESTYLE LIBRE 14 DAY SENSOR) MISC  06/02/21   [provider]  diphenhydrAMINE-APAP, sleep, (TYLENOL PM EXTRA STRENGTH) 50-1000 MG/30ML LIQD Take 1,000 mLs by mouth daily.    [provider]  donepezil (ARICEPT) 10 MG tablet Take 1 tablet (10 mg total) by mouth at bedtime. 01/13/22   Ihor Austin, NP  ELIQUIS 2.5 MG TABS tablet Take 2.5 mg by mouth 2 (two) times daily. 01/13/22   [provider]  famotidine (PEPCID) 20 MG tablet Take 20 mg by mouth 2 (two) times daily. 07/05/19   [provider]  ferrous sulfate 325 (65 FE) MG EC tablet Take 325 mg by mouth daily with breakfast.    [provider]  glipiZIDE (GLUCOTROL) 5 MG tablet Take 5 mg by mouth daily. 01/13/22   [provider]  HUMULIN 70/30 (70-30) 100 UNIT/ML injection Inject into the skin. 05/25/21   [provider]  HYDROcodone-acetaminophen (NORCO/VICODIN) 5-325 MG tablet Take 0.5 tablets by mouth daily.  10/10/22   [provider]  hydrOXYzine (ATARAX) 10 MG tablet Take 10 mg by mouth 3 (three) times daily as needed for nausea.    [provider]  isosorbide mononitrate (IMDUR) 60 MG 24 hr tablet Take 1 tablet (60 mg total) by mouth daily. 04/17/20   Allred, Fayrene Fearing, MD  latanoprost (XALATAN) 0.005 % ophthalmic solution  02/23/21   [provider]  lisinopril (ZESTRIL) 10 MG tablet Take 10 mg by mouth every evening. 12/10/18   [provider]  LORazepam (ATIVAN) 0.5 MG tablet Take 0.25 mg by mouth every 6 (six) hours as needed. 11/23/22   [provider]  LUMIGAN 0.01 % SOLN Place 1 drop into both eyes. 09/16/22   [provider]  meclizine (ANTIVERT) 25 MG tablet Take 25 mg by mouth 3 (three) times daily as needed for dizziness.    [provider]  memantine (NAMENDA) 10 MG tablet TAKE 1 TABLET BY MOUTH TWICE  DAILY 11/02/21   Ihor Austin, NP  Menthol, Topical Analgesic, (BIOFREEZE) 4 % GEL Apply topically as needed.    [provider]  metFORMIN (GLUCOPHAGE) 1000 MG tablet Take 1,000 mg by mouth 2 (two) times daily with a meal.    [provider]  metoprolol succinate (TOPROL-XL) 25 MG 24 hr tablet Take 1 tablet (25 mg total) by mouth daily. 05/09/22   Mealor, Roberts Gaudy, MD  nitroGLYCERIN (NITROSTAT)  0.4 MG SL tablet Place 1 tablet (0.4 mg total) under the tongue every 5 (five) minutes x 3 doses as needed. 04/15/16   Hillis Range, MD  [facility] VERIO test strip  08/17/17   [provider]  sertraline (ZOLOFT) 100 MG tablet Take 100 mg by mouth daily. 04/19/19   [provider]  simvastatin (ZOCOR) 40 MG tablet Take 40 mg by mouth at bedtime.    [provider]  tamsulosin (FLOMAX) 0.4 MG CAPS capsule Take 0.4 mg by mouth daily. 09/03/20   [provider]     Allergies    Contrast media [iodinated contrast media], Vioxx [rofecoxib], Shellfish allergy, and Sulfonamide derivatives   Review of  Systems   Review of Systems Please see HPI for pertinent positives and negatives  Physical Exam BP (!) 172/72 (BP Location: Left Arm)   Pulse 60   Temp 97.7 F (36.5 C) (Oral)   Resp 16   Ht 5\' 11"  (1.803 m)   Wt 81.6 kg   SpO2 99%   BMI 25.08 kg/m   Physical Exam Vitals and nursing note reviewed.  Constitutional:      Appearance: Normal appearance.  HENT:     Head: Normocephalic and atraumatic.     Nose: Nose normal.     Mouth/Throat:     Mouth: Mucous membranes are moist.  Eyes:     Extraocular Movements: Extraocular movements intact.     Conjunctiva/sclera: Conjunctivae normal.  Cardiovascular:     Rate and Rhythm: Normal rate.  Pulmonary:     Effort: Pulmonary effort is normal.     Breath sounds: Normal breath sounds.  Chest:     Chest wall: Tenderness (R lateral lower ribs) present.  Abdominal:     General: Abdomen is flat.     Palpations: Abdomen is soft.     Tenderness: There is no abdominal tenderness.  Musculoskeletal:        General: No swelling. Normal range of motion.     Cervical back: Neck supple. No tenderness.     Comments: Torn fingernail on L thumb without repairable laceration, small skin tear on R elbow  Skin:    General: Skin is warm and dry.  Neurological:     General: No focal deficit present.     Mental Status: He is alert.     Cranial Nerves: No cranial nerve deficit.     Sensory: No sensory deficit.     Motor: No weakness.  Psychiatric:        Mood and Affect: Mood normal.     ED Results / Procedures / Treatments   EKG None  Procedures Procedures  Medications Ordered in the ED Medications - No data to display  Initial Impression and Plan  Patient here with unwitnessed fall. Vitals are reassuring. Will check imaging and reassess for dispo.   ED Course   Clinical Course as of 11/28/22 0457  Mon Nov 28, 2022  1610 I personally viewed the images from radiology studies and agree with radiologist interpretation:  Xrays and  CT are negative for acute fracture. No bony tenderness at base of 1st digit.  Patient remains asymptomatic. Plan return to SNF.  [CS]    Clinical Course User Index [CS] Pollyann Savoy, MD     MDM Rules/Calculators/A&P Medical Decision Making Problems Addressed: Chest wall pain: acute illness or injury Fall, initial encounter: acute illness or injury Fingernail avulsion, partial, initial encounter: acute illness or injury  Amount and/or Complexity of Data  Reviewed Radiology: ordered and independent interpretation performed. Decision-making details documented in ED Course.     Final Clinical Impression(s) / ED Diagnoses Final diagnoses:  Fall, initial encounter  Chest wall pain  Fingernail avulsion, partial, initial encounter    Rx / DC Orders ED Discharge Orders     None        Pollyann Savoy, MD 11/28/22 [phone]

## 2022-11-28 NOTE — Progress Notes (Signed)
Patient tolerated iron infusion with no complaints voiced.  Peripheral IV site clean and dry with good blood return noted before and after infusion.  Band aid applied.  VSS with discharge and left in satisfactory condition with no s/s of distress noted.

## 2022-11-28 NOTE — ED Notes (Signed)
Patient transported to CT/xray

## 2022-11-28 NOTE — Discharge Instructions (Signed)
Please keep the injured thumb nail covered. It may fall off in the next few days.

## 2022-11-28 NOTE — ED Triage Notes (Signed)
Pt to ed via [facility] from [facility]. Unwitnessed fall on blood thinners. Pt does not remember how fall happened and denies LOC. Pt has dementia. Pt c/o right sided rib pain, right elbow skin tear and left thumb laceration. Bandaid placed on thumb from facility with bleeding contained at this time. Pt able to move all four extremities at this time. No bumps or bleeding seen or felt on pt's head at this time.

## 2022-11-28 NOTE — ED Notes (Signed)
ED Provider at bedside. 

## 2022-11-28 NOTE — Patient Instructions (Signed)
[facility]  Discharge Instructions: Thank you for choosing [facility] to provide your oncology and hematology care.  If you have a lab appointment with the Cancer Center - please note that after April 8th, 2024, all labs will be drawn in the cancer center.  You do not have to check in or register with the main entrance as you have in the past but will complete your check-in in the cancer center.  Wear comfortable clothing and clothing appropriate for easy access to any Portacath or PICC line.   We strive to give you quality time with your provider. You may need to reschedule your appointment if you arrive late (15 or more minutes).  Arriving late affects you and other patients whose appointments are after yours.  Also, if you miss three or more appointments without notifying the office, you may be dismissed from the clinic at the provider's discretion.      For prescription refill requests, have your pharmacy contact our office and allow 72 hours for refills to be completed.      To help prevent nausea and vomiting after your treatment, we encourage you to take your nausea medication as directed.  BELOW ARE SYMPTOMS THAT SHOULD BE REPORTED IMMEDIATELY: *FEVER GREATER THAN 100.4 F (38 C) OR HIGHER *CHILLS OR SWEATING *NAUSEA AND VOMITING THAT IS NOT CONTROLLED WITH YOUR NAUSEA MEDICATION *UNUSUAL SHORTNESS OF BREATH *UNUSUAL BRUISING OR BLEEDING *URINARY PROBLEMS (pain or burning when urinating, or frequent urination) *BOWEL PROBLEMS (unusual diarrhea, constipation, pain near the anus) TENDERNESS IN MOUTH AND THROAT WITH OR WITHOUT PRESENCE OF ULCERS (sore throat, sores in mouth, or a toothache) UNUSUAL RASH, SWELLING OR PAIN  UNUSUAL VAGINAL DISCHARGE OR ITCHING   Items with * indicate a potential emergency and should be followed up as soon as possible or go to the Emergency Department if any problems should occur.  Please show the CHEMOTHERAPY ALERT  CARD or IMMUNOTHERAPY ALERT CARD at check-in to the Emergency Department and triage nurse.  Should you have questions after your visit or need to cancel or reschedule your appointment, please contact [facility] CENTER AT [facility] [phone]  and follow the prompts.  Office hours are 8:00 a.m. to 4:30 p.m. Monday - Friday. Please note that voicemails left after 4:00 p.m. may not be returned until the following business day.  We are closed weekends and major holidays. You have access to a nurse at all times for urgent questions. Please call the main number to the clinic [phone] and follow the prompts.  For any non-urgent questions, you may also contact your provider using MyChart. We now offer e-Visits for anyone 60 and older to request care online for non-urgent symptoms. For details visit [email or web address].   Also download the MyChart app! Go to the app store, search "MyChart", open the app, select [place], and log in with your MyChart username and password.

## 2022-11-28 NOTE — ED Notes (Signed)
Pt given urinal upon request

## 2022-11-29 DIAGNOSIS — G301 Alzheimer's disease with late onset: Secondary | ICD-10-CM | POA: Diagnosis not present

## 2022-12-01 DIAGNOSIS — E559 Vitamin D deficiency, unspecified: Secondary | ICD-10-CM | POA: Diagnosis not present

## 2022-12-01 DIAGNOSIS — E039 Hypothyroidism, unspecified: Secondary | ICD-10-CM | POA: Diagnosis not present

## 2022-12-01 DIAGNOSIS — E119 Type 2 diabetes mellitus without complications: Secondary | ICD-10-CM | POA: Diagnosis not present

## 2022-12-01 DIAGNOSIS — I10 Essential (primary) hypertension: Secondary | ICD-10-CM | POA: Diagnosis not present

## 2022-12-01 DIAGNOSIS — E782 Mixed hyperlipidemia: Secondary | ICD-10-CM | POA: Diagnosis not present

## 2022-12-05 ENCOUNTER — Inpatient Hospital Stay: Payer: Medicare Other

## 2022-12-05 VITALS — BP 163/79 | HR 63 | Temp 98.5°F | Resp 16

## 2022-12-05 DIAGNOSIS — N189 Chronic kidney disease, unspecified: Secondary | ICD-10-CM | POA: Diagnosis not present

## 2022-12-05 DIAGNOSIS — Z7901 Long term (current) use of anticoagulants: Secondary | ICD-10-CM | POA: Diagnosis not present

## 2022-12-05 DIAGNOSIS — Z825 Family history of asthma and other chronic lower respiratory diseases: Secondary | ICD-10-CM | POA: Diagnosis not present

## 2022-12-05 DIAGNOSIS — D509 Iron deficiency anemia, unspecified: Secondary | ICD-10-CM

## 2022-12-05 DIAGNOSIS — I700 Atherosclerosis of aorta: Secondary | ICD-10-CM | POA: Diagnosis not present

## 2022-12-05 DIAGNOSIS — Z79899 Other long term (current) drug therapy: Secondary | ICD-10-CM | POA: Diagnosis not present

## 2022-12-05 DIAGNOSIS — M4856XA Collapsed vertebra, not elsewhere classified, lumbar region, initial encounter for fracture: Secondary | ICD-10-CM | POA: Diagnosis not present

## 2022-12-05 DIAGNOSIS — M8580 Other specified disorders of bone density and structure, unspecified site: Secondary | ICD-10-CM | POA: Diagnosis not present

## 2022-12-05 DIAGNOSIS — Z91041 Radiographic dye allergy status: Secondary | ICD-10-CM | POA: Diagnosis not present

## 2022-12-05 DIAGNOSIS — I4891 Unspecified atrial fibrillation: Secondary | ICD-10-CM | POA: Diagnosis not present

## 2022-12-05 DIAGNOSIS — Z882 Allergy status to sulfonamides status: Secondary | ICD-10-CM | POA: Diagnosis not present

## 2022-12-05 DIAGNOSIS — G894 Chronic pain syndrome: Secondary | ICD-10-CM | POA: Diagnosis not present

## 2022-12-05 DIAGNOSIS — E1122 Type 2 diabetes mellitus with diabetic chronic kidney disease: Secondary | ICD-10-CM | POA: Diagnosis not present

## 2022-12-05 DIAGNOSIS — M549 Dorsalgia, unspecified: Secondary | ICD-10-CM | POA: Diagnosis not present

## 2022-12-05 DIAGNOSIS — Z8719 Personal history of other diseases of the digestive system: Secondary | ICD-10-CM | POA: Diagnosis not present

## 2022-12-05 DIAGNOSIS — Z801 Family history of malignant neoplasm of trachea, bronchus and lung: Secondary | ICD-10-CM | POA: Diagnosis not present

## 2022-12-05 DIAGNOSIS — Z8249 Family history of ischemic heart disease and other diseases of the circulatory system: Secondary | ICD-10-CM | POA: Diagnosis not present

## 2022-12-05 DIAGNOSIS — C9000 Multiple myeloma not having achieved remission: Secondary | ICD-10-CM | POA: Diagnosis not present

## 2022-12-05 DIAGNOSIS — D631 Anemia in chronic kidney disease: Secondary | ICD-10-CM | POA: Diagnosis not present

## 2022-12-05 DIAGNOSIS — D696 Thrombocytopenia, unspecified: Secondary | ICD-10-CM | POA: Diagnosis not present

## 2022-12-05 MED ORDER — CETIRIZINE HCL 10 MG PO TABS
10.0000 mg | ORAL_TABLET | Freq: Once | ORAL | Status: AC
  Administered 2022-12-05: 10 mg via ORAL
  Filled 2022-12-05: qty 1

## 2022-12-05 MED ORDER — SODIUM CHLORIDE 0.9 % IV SOLN: Freq: Once | INTRAVENOUS | Status: AC

## 2022-12-05 MED ORDER — SODIUM CHLORIDE 0.9 % IV SOLN
510.0000 mg | Freq: Once | INTRAVENOUS | Status: AC
  Administered 2022-12-05: 510 mg via INTRAVENOUS
  Filled 2022-12-05: qty 510

## 2022-12-05 MED ORDER — ACETAMINOPHEN 325 MG PO TABS
650.0000 mg | ORAL_TABLET | Freq: Once | ORAL | Status: AC
  Administered 2022-12-05: 650 mg via ORAL
  Filled 2022-12-05: qty 2

## 2022-12-05 NOTE — Patient Instructions (Signed)
[facility]  Discharge Instructions: Thank you for choosing [facility] to provide your oncology and hematology care.  If you have a lab appointment with the Cancer Center - please note that after April 8th, 2024, all labs will be drawn in the cancer center.  You do not have to check in or register with the main entrance as you have in the past but will complete your check-in in the cancer center.  Wear comfortable clothing and clothing appropriate for easy access to any Portacath or PICC line.   We strive to give you quality time with your provider. You may need to reschedule your appointment if you arrive late (15 or more minutes).  Arriving late affects you and other patients whose appointments are after yours.  Also, if you miss three or more appointments without notifying the office, you may be dismissed from the clinic at the provider's discretion.      For prescription refill requests, have your pharmacy contact our office and allow 72 hours for refills to be completed.    Today you received the following: Feraheme iron infusion.   To help prevent nausea and vomiting after your treatment, we encourage you to take your nausea medication as directed.  BELOW ARE SYMPTOMS THAT SHOULD BE REPORTED IMMEDIATELY: *FEVER GREATER THAN 100.4 F (38 C) OR HIGHER *CHILLS OR SWEATING *NAUSEA AND VOMITING THAT IS NOT CONTROLLED WITH YOUR NAUSEA MEDICATION *UNUSUAL SHORTNESS OF BREATH *UNUSUAL BRUISING OR BLEEDING *URINARY PROBLEMS (pain or burning when urinating, or frequent urination) *BOWEL PROBLEMS (unusual diarrhea, constipation, pain near the anus) TENDERNESS IN MOUTH AND THROAT WITH OR WITHOUT PRESENCE OF ULCERS (sore throat, sores in mouth, or a toothache) UNUSUAL RASH, SWELLING OR PAIN  UNUSUAL VAGINAL DISCHARGE OR ITCHING   Items with * indicate a potential emergency and should be followed up as soon as possible or go to the Emergency Department if any  problems should occur.  Please show the CHEMOTHERAPY ALERT CARD or IMMUNOTHERAPY ALERT CARD at check-in to the Emergency Department and triage nurse.  Should you have questions after your visit or need to cancel or reschedule your appointment, please contact [facility] CENTER AT [facility] [phone]  and follow the prompts.  Office hours are 8:00 a.m. to 4:30 p.m. Monday - Friday. Please note that voicemails left after 4:00 p.m. may not be returned until the following business day.  We are closed weekends and major holidays. You have access to a nurse at all times for urgent questions. Please call the main number to the clinic [phone] and follow the prompts.  For any non-urgent questions, you may also contact your provider using MyChart. We now offer e-Visits for anyone 78 and older to request care online for non-urgent symptoms. For details visit [email or web address].   Also download the MyChart app! Go to the app store, search "MyChart", open the app, select [place], and log in with your MyChart username and password.

## 2022-12-05 NOTE — Progress Notes (Signed)
Treatment given per orders. Patient tolerated it well without problems. Vitals stable and discharged home from clinic ambulatory. Follow up as scheduled.

## 2022-12-06 DIAGNOSIS — D649 Anemia, unspecified: Secondary | ICD-10-CM | POA: Diagnosis not present

## 2022-12-19 DIAGNOSIS — K219 Gastro-esophageal reflux disease without esophagitis: Secondary | ICD-10-CM | POA: Diagnosis not present

## 2022-12-19 DIAGNOSIS — E1169 Type 2 diabetes mellitus with other specified complication: Secondary | ICD-10-CM | POA: Diagnosis not present

## 2022-12-19 DIAGNOSIS — D649 Anemia, unspecified: Secondary | ICD-10-CM | POA: Diagnosis not present

## 2022-12-20 DIAGNOSIS — B351 Tinea unguium: Secondary | ICD-10-CM | POA: Diagnosis not present

## 2022-12-20 DIAGNOSIS — M79674 Pain in right toe(s): Secondary | ICD-10-CM | POA: Diagnosis not present

## 2022-12-20 DIAGNOSIS — M79675 Pain in left toe(s): Secondary | ICD-10-CM | POA: Diagnosis not present

## 2022-12-25 DIAGNOSIS — G301 Alzheimer's disease with late onset: Secondary | ICD-10-CM | POA: Diagnosis not present

## 2022-12-28 DIAGNOSIS — E119 Type 2 diabetes mellitus without complications: Secondary | ICD-10-CM | POA: Diagnosis not present

## 2022-12-29 DIAGNOSIS — H401122 Primary open-angle glaucoma, left eye, moderate stage: Secondary | ICD-10-CM | POA: Diagnosis not present

## 2023-01-02 DIAGNOSIS — G894 Chronic pain syndrome: Secondary | ICD-10-CM | POA: Diagnosis not present

## 2023-01-11 DIAGNOSIS — D649 Anemia, unspecified: Secondary | ICD-10-CM | POA: Diagnosis not present

## 2023-01-13 ENCOUNTER — Ambulatory Visit (INDEPENDENT_AMBULATORY_CARE_PROVIDER_SITE_OTHER): Payer: Medicare Other

## 2023-01-13 DIAGNOSIS — I442 Atrioventricular block, complete: Secondary | ICD-10-CM

## 2023-01-13 LAB — CUP PACEART REMOTE DEVICE CHECK
Battery Remaining Longevity: 32 mo
Battery Remaining Percentage: 24 %
Battery Voltage: 2.95 V
Brady Statistic RV Percent Paced: 99 %
Date Time Interrogation Session: 20241101043132
Implantable Lead Connection Status: 753985
Implantable Lead Implant Date: 20020215
Implantable Lead Location: 753860
Implantable Pulse Generator Implant Date: 20141121
Lead Channel Impedance Value: 590 Ohm
Lead Channel Pacing Threshold Amplitude: 1.25 V
Lead Channel Pacing Threshold Pulse Width: 0.4 ms
Lead Channel Sensing Intrinsic Amplitude: 12 mV
Lead Channel Setting Pacing Amplitude: 1.5 V
Lead Channel Setting Pacing Pulse Width: 0.4 ms
Lead Channel Setting Sensing Sensitivity: 6 mV
Pulse Gen Model: 1240
Pulse Gen Serial Number: 7488472

## 2023-01-25 NOTE — Progress Notes (Signed)
Remote pacemaker transmission.   

## 2023-01-31 DIAGNOSIS — Z79899 Other long term (current) drug therapy: Secondary | ICD-10-CM | POA: Diagnosis not present

## 2023-01-31 DIAGNOSIS — G309 Alzheimer's disease, unspecified: Secondary | ICD-10-CM | POA: Diagnosis not present

## 2023-01-31 DIAGNOSIS — G8929 Other chronic pain: Secondary | ICD-10-CM | POA: Diagnosis not present

## 2023-01-31 DIAGNOSIS — R52 Pain, unspecified: Secondary | ICD-10-CM | POA: Diagnosis not present

## 2023-02-01 DIAGNOSIS — M6281 Muscle weakness (generalized): Secondary | ICD-10-CM | POA: Diagnosis not present

## 2023-02-01 DIAGNOSIS — E785 Hyperlipidemia, unspecified: Secondary | ICD-10-CM | POA: Diagnosis not present

## 2023-02-01 DIAGNOSIS — R2689 Other abnormalities of gait and mobility: Secondary | ICD-10-CM | POA: Diagnosis not present

## 2023-02-01 DIAGNOSIS — Z794 Long term (current) use of insulin: Secondary | ICD-10-CM | POA: Diagnosis not present

## 2023-02-01 DIAGNOSIS — I10 Essential (primary) hypertension: Secondary | ICD-10-CM | POA: Diagnosis not present

## 2023-02-01 DIAGNOSIS — I5042 Chronic combined systolic (congestive) and diastolic (congestive) heart failure: Secondary | ICD-10-CM | POA: Diagnosis not present

## 2023-02-01 DIAGNOSIS — M1A00X0 Idiopathic chronic gout, unspecified site, without tophus (tophi): Secondary | ICD-10-CM | POA: Diagnosis not present

## 2023-02-01 DIAGNOSIS — R262 Difficulty in walking, not elsewhere classified: Secondary | ICD-10-CM | POA: Diagnosis not present

## 2023-02-01 DIAGNOSIS — G309 Alzheimer's disease, unspecified: Secondary | ICD-10-CM | POA: Diagnosis not present

## 2023-02-01 DIAGNOSIS — K219 Gastro-esophageal reflux disease without esophagitis: Secondary | ICD-10-CM | POA: Diagnosis not present

## 2023-02-01 DIAGNOSIS — I2510 Atherosclerotic heart disease of native coronary artery without angina pectoris: Secondary | ICD-10-CM | POA: Diagnosis not present

## 2023-02-01 DIAGNOSIS — E119 Type 2 diabetes mellitus without complications: Secondary | ICD-10-CM | POA: Diagnosis not present

## 2023-02-02 DIAGNOSIS — Z7189 Other specified counseling: Secondary | ICD-10-CM | POA: Diagnosis not present

## 2023-02-02 DIAGNOSIS — R262 Difficulty in walking, not elsewhere classified: Secondary | ICD-10-CM | POA: Diagnosis not present

## 2023-02-02 DIAGNOSIS — R2689 Other abnormalities of gait and mobility: Secondary | ICD-10-CM | POA: Diagnosis not present

## 2023-02-02 DIAGNOSIS — G309 Alzheimer's disease, unspecified: Secondary | ICD-10-CM | POA: Diagnosis not present

## 2023-02-02 DIAGNOSIS — I5042 Chronic combined systolic (congestive) and diastolic (congestive) heart failure: Secondary | ICD-10-CM | POA: Diagnosis not present

## 2023-02-02 DIAGNOSIS — M6281 Muscle weakness (generalized): Secondary | ICD-10-CM | POA: Diagnosis not present

## 2023-02-02 DIAGNOSIS — Z79899 Other long term (current) drug therapy: Secondary | ICD-10-CM | POA: Diagnosis not present

## 2023-02-02 DIAGNOSIS — I2510 Atherosclerotic heart disease of native coronary artery without angina pectoris: Secondary | ICD-10-CM | POA: Diagnosis not present

## 2023-02-02 DIAGNOSIS — I10 Essential (primary) hypertension: Secondary | ICD-10-CM | POA: Diagnosis not present

## 2023-02-02 DIAGNOSIS — K219 Gastro-esophageal reflux disease without esophagitis: Secondary | ICD-10-CM | POA: Diagnosis not present

## 2023-02-03 DIAGNOSIS — M6281 Muscle weakness (generalized): Secondary | ICD-10-CM | POA: Diagnosis not present

## 2023-02-03 DIAGNOSIS — G309 Alzheimer's disease, unspecified: Secondary | ICD-10-CM | POA: Diagnosis not present

## 2023-02-03 DIAGNOSIS — R2689 Other abnormalities of gait and mobility: Secondary | ICD-10-CM | POA: Diagnosis not present

## 2023-02-03 DIAGNOSIS — M545 Low back pain, unspecified: Secondary | ICD-10-CM | POA: Diagnosis not present

## 2023-02-03 DIAGNOSIS — R0782 Intercostal pain: Secondary | ICD-10-CM | POA: Diagnosis not present

## 2023-02-03 DIAGNOSIS — M546 Pain in thoracic spine: Secondary | ICD-10-CM | POA: Diagnosis not present

## 2023-02-03 DIAGNOSIS — R262 Difficulty in walking, not elsewhere classified: Secondary | ICD-10-CM | POA: Diagnosis not present

## 2023-02-04 DIAGNOSIS — G309 Alzheimer's disease, unspecified: Secondary | ICD-10-CM | POA: Diagnosis not present

## 2023-02-04 DIAGNOSIS — M6281 Muscle weakness (generalized): Secondary | ICD-10-CM | POA: Diagnosis not present

## 2023-02-04 DIAGNOSIS — R262 Difficulty in walking, not elsewhere classified: Secondary | ICD-10-CM | POA: Diagnosis not present

## 2023-02-04 DIAGNOSIS — R2689 Other abnormalities of gait and mobility: Secondary | ICD-10-CM | POA: Diagnosis not present

## 2023-02-05 DIAGNOSIS — M6281 Muscle weakness (generalized): Secondary | ICD-10-CM | POA: Diagnosis not present

## 2023-02-05 DIAGNOSIS — R262 Difficulty in walking, not elsewhere classified: Secondary | ICD-10-CM | POA: Diagnosis not present

## 2023-02-05 DIAGNOSIS — M25559 Pain in unspecified hip: Secondary | ICD-10-CM | POA: Diagnosis not present

## 2023-02-05 DIAGNOSIS — R2689 Other abnormalities of gait and mobility: Secondary | ICD-10-CM | POA: Diagnosis not present

## 2023-02-05 DIAGNOSIS — G309 Alzheimer's disease, unspecified: Secondary | ICD-10-CM | POA: Diagnosis not present

## 2023-02-06 DIAGNOSIS — R52 Pain, unspecified: Secondary | ICD-10-CM | POA: Diagnosis not present

## 2023-02-06 DIAGNOSIS — R2689 Other abnormalities of gait and mobility: Secondary | ICD-10-CM | POA: Diagnosis not present

## 2023-02-06 DIAGNOSIS — R262 Difficulty in walking, not elsewhere classified: Secondary | ICD-10-CM | POA: Diagnosis not present

## 2023-02-06 DIAGNOSIS — I10 Essential (primary) hypertension: Secondary | ICD-10-CM | POA: Diagnosis not present

## 2023-02-06 DIAGNOSIS — E119 Type 2 diabetes mellitus without complications: Secondary | ICD-10-CM | POA: Diagnosis not present

## 2023-02-06 DIAGNOSIS — M1A00X0 Idiopathic chronic gout, unspecified site, without tophus (tophi): Secondary | ICD-10-CM | POA: Diagnosis not present

## 2023-02-06 DIAGNOSIS — M6281 Muscle weakness (generalized): Secondary | ICD-10-CM | POA: Diagnosis not present

## 2023-02-06 DIAGNOSIS — S32592D Other specified fracture of left pubis, subsequent encounter for fracture with routine healing: Secondary | ICD-10-CM | POA: Diagnosis not present

## 2023-02-06 DIAGNOSIS — G309 Alzheimer's disease, unspecified: Secondary | ICD-10-CM | POA: Diagnosis not present

## 2023-02-06 DIAGNOSIS — Z794 Long term (current) use of insulin: Secondary | ICD-10-CM | POA: Diagnosis not present

## 2023-02-07 ENCOUNTER — Emergency Department (HOSPITAL_COMMUNITY)
Admission: EM | Admit: 2023-02-07 | Discharge: 2023-02-07 | Disposition: A | Discharge status: Home / Self Care | Payer: Medicare Other | Attending: Emergency Medicine | Admitting: Emergency Medicine

## 2023-02-07 ENCOUNTER — Emergency Department (HOSPITAL_COMMUNITY): Payer: Medicare Other

## 2023-02-07 DIAGNOSIS — R6889 Other general symptoms and signs: Secondary | ICD-10-CM | POA: Diagnosis not present

## 2023-02-07 DIAGNOSIS — Z7401 Bed confinement status: Secondary | ICD-10-CM | POA: Diagnosis not present

## 2023-02-07 DIAGNOSIS — E559 Vitamin D deficiency, unspecified: Secondary | ICD-10-CM | POA: Diagnosis not present

## 2023-02-07 DIAGNOSIS — G319 Degenerative disease of nervous system, unspecified: Secondary | ICD-10-CM | POA: Diagnosis not present

## 2023-02-07 DIAGNOSIS — M25552 Pain in left hip: Secondary | ICD-10-CM | POA: Insufficient documentation

## 2023-02-07 DIAGNOSIS — S32592D Other specified fracture of left pubis, subsequent encounter for fracture with routine healing: Secondary | ICD-10-CM | POA: Diagnosis not present

## 2023-02-07 DIAGNOSIS — I6529 Occlusion and stenosis of unspecified carotid artery: Secondary | ICD-10-CM | POA: Diagnosis not present

## 2023-02-07 DIAGNOSIS — D649 Anemia, unspecified: Secondary | ICD-10-CM | POA: Diagnosis not present

## 2023-02-07 DIAGNOSIS — I700 Atherosclerosis of aorta: Secondary | ICD-10-CM | POA: Diagnosis not present

## 2023-02-07 DIAGNOSIS — M545 Low back pain, unspecified: Secondary | ICD-10-CM | POA: Diagnosis not present

## 2023-02-07 DIAGNOSIS — M549 Dorsalgia, unspecified: Secondary | ICD-10-CM | POA: Diagnosis not present

## 2023-02-07 DIAGNOSIS — W19XXXA Unspecified fall, initial encounter: Secondary | ICD-10-CM | POA: Diagnosis not present

## 2023-02-07 DIAGNOSIS — N200 Calculus of kidney: Secondary | ICD-10-CM | POA: Diagnosis not present

## 2023-02-07 DIAGNOSIS — S2249XA Multiple fractures of ribs, unspecified side, initial encounter for closed fracture: Secondary | ICD-10-CM | POA: Diagnosis not present

## 2023-02-07 DIAGNOSIS — I4891 Unspecified atrial fibrillation: Secondary | ICD-10-CM | POA: Diagnosis not present

## 2023-02-07 DIAGNOSIS — E119 Type 2 diabetes mellitus without complications: Secondary | ICD-10-CM | POA: Insufficient documentation

## 2023-02-07 DIAGNOSIS — F0390 Unspecified dementia without behavioral disturbance: Secondary | ICD-10-CM | POA: Diagnosis not present

## 2023-02-07 DIAGNOSIS — Z79899 Other long term (current) drug therapy: Secondary | ICD-10-CM | POA: Insufficient documentation

## 2023-02-07 DIAGNOSIS — Z7984 Long term (current) use of oral hypoglycemic drugs: Secondary | ICD-10-CM | POA: Diagnosis not present

## 2023-02-07 DIAGNOSIS — E038 Other specified hypothyroidism: Secondary | ICD-10-CM | POA: Diagnosis not present

## 2023-02-07 DIAGNOSIS — Z043 Encounter for examination and observation following other accident: Secondary | ICD-10-CM | POA: Diagnosis not present

## 2023-02-07 DIAGNOSIS — Z743 Need for continuous supervision: Secondary | ICD-10-CM | POA: Diagnosis not present

## 2023-02-07 DIAGNOSIS — Z7901 Long term (current) use of anticoagulants: Secondary | ICD-10-CM | POA: Insufficient documentation

## 2023-02-07 DIAGNOSIS — S2242XA Multiple fractures of ribs, left side, initial encounter for closed fracture: Secondary | ICD-10-CM | POA: Insufficient documentation

## 2023-02-07 DIAGNOSIS — G8929 Other chronic pain: Secondary | ICD-10-CM | POA: Diagnosis not present

## 2023-02-07 DIAGNOSIS — I6782 Cerebral ischemia: Secondary | ICD-10-CM | POA: Diagnosis not present

## 2023-02-07 DIAGNOSIS — K8020 Calculus of gallbladder without cholecystitis without obstruction: Secondary | ICD-10-CM | POA: Diagnosis not present

## 2023-02-07 DIAGNOSIS — D513 Other dietary vitamin B12 deficiency anemia: Secondary | ICD-10-CM | POA: Diagnosis not present

## 2023-02-07 MED ORDER — OXYCODONE-ACETAMINOPHEN 5-325 MG PO TABS: 1.0000 | ORAL_TABLET | Freq: Four times a day (QID) | ORAL | 0 refills | Status: DC | PRN

## 2023-02-07 MED ORDER — OXYCODONE-ACETAMINOPHEN 5-325 MG PO TABS
1.0000 | ORAL_TABLET | Freq: Once | ORAL | Status: AC
  Administered 2023-02-07: 1 via ORAL
  Filled 2023-02-07: qty 1

## 2023-02-07 MED ORDER — LIDOCAINE 5 % EX PTCH: 1.0000 | MEDICATED_PATCH | CUTANEOUS | 0 refills | Status: DC

## 2023-02-07 MED ORDER — LIDOCAINE 5 % EX PTCH
1.0000 | MEDICATED_PATCH | CUTANEOUS | Status: DC
  Administered 2023-02-07: 1 via TRANSDERMAL
  Filled 2023-02-07: qty 1

## 2023-02-07 NOTE — ED Provider Triage Note (Signed)
Emergency Medicine Provider Triage Evaluation Note  Jay Campbell , a 77 y.o. male  was evaluated in triage.  Pt complains of left hip pain, left lateral lower rib pain, lower back pain.  Review of Systems  Positive: Pain to left hip, left lateral lower ribs, and lumbar spine Negative: Chest pain, shortness of breath  Physical Exam  BP (!) 167/70 (BP Location: Right Arm)   Pulse 61   Temp (!) 97.5 F (36.4 C) (Oral)   Resp 18   SpO2 100%  Gen:   Awake, no distress   Resp:  Normal effort  MSK:   Moves extremities without difficulty, no deformities, tenderness to left lower lateral ribs and lumbar area  Medical Decision Making  Medically screening exam initiated at 1:46 PM.  Appropriate orders placed.  Jay Campbell was informed that the remainder of the evaluation will be completed by another provider, this initial triage assessment does not replace that evaluation, and the importance of remaining in the ED until their evaluation is complete.  Patient presents from [facility] nursing facility for pain in areas of left lateral lower ribs, left hip, and lower back.  This is after 2 recent falls.  Nursing facility requests CT imaging.   Jay Manchester, MD 02/07/23 [phone]

## 2023-02-07 NOTE — ED Provider Notes (Signed)
[place] EMERGENCY DEPARTMENT AT [facility] Provider Note   CSN: [id number] Arrival date & time: 02/07/23  1345    History  Chief Complaint  Patient presents with   Back Pain    Jay Campbell is a 77 y.o. male history of multiple myeloma, diabetes, dementia, Afib (on anticoagulation) here for evaluation of fall.  Patient states he walks with a Eisenhour at baseline.  States he tripped and fell last Tuesday, 1 week ago.  He states since then he has had left hip pain.  He denies any headache, nausea, vomiting, chest pain.  He stated he landed flat on his back when he fell. Has been able to walk since the fall however with pain.  HPI     Home Medications Prior to Admission medications   Medication Sig Start Date End Date Taking? Authorizing Provider  lidocaine (LIDODERM) 5 % Place 1 patch onto the skin daily. Remove & Discard patch within 12 hours or as directed by MD 02/07/23  Yes Royal Vandevoort A, PA-C  oxyCODONE-acetaminophen (PERCOCET/ROXICET) 5-325 MG tablet Take 1 tablet by mouth every 6 (six) hours as needed for severe pain (pain score 7-10). 02/07/23  Yes Nashaun Hillmer A, PA-C  allopurinol (ZYLOPRIM) 100 MG tablet Take 100 mg by mouth Daily at 5am. 12/15/11   [provider]  BD INSULIN SYRINGE U/F 31G X 5/16" 0.3 ML MISC  06/26/19   [provider]  cholecalciferol (VITAMIN D3) 25 MCG (1000 UNIT) tablet Take 2,000 Units by mouth daily.    [provider]  colchicine 0.6 MG tablet Take 0.6 mg by mouth as needed.    [provider]  Continuous Blood Gluc Sensor (FREESTYLE LIBRE 14 DAY SENSOR) MISC  06/02/21   [provider]  diphenhydrAMINE-APAP, sleep, (TYLENOL PM EXTRA STRENGTH) 50-1000 MG/30ML LIQD Take 1,000 mLs by mouth daily.    [provider]  donepezil (ARICEPT) 10 MG tablet Take 1 tablet (10 mg total) by mouth at bedtime. 01/13/22   Ihor Austin, NP  ELIQUIS 2.5 MG TABS tablet Take 2.5 mg by mouth 2  (two) times daily. 01/13/22   [provider]  famotidine (PEPCID) 20 MG tablet Take 20 mg by mouth 2 (two) times daily. 07/05/19   [provider]  ferrous sulfate 325 (65 FE) MG EC tablet Take 325 mg by mouth daily with breakfast.    [provider]  glipiZIDE (GLUCOTROL) 5 MG tablet Take 5 mg by mouth daily. 01/13/22   [provider]  HUMULIN 70/30 (70-30) 100 UNIT/ML injection Inject into the skin. 05/25/21   [provider]  HYDROcodone-acetaminophen (NORCO/VICODIN) 5-325 MG tablet Take 0.5 tablets by mouth daily. 10/10/22   [provider]  hydrOXYzine (ATARAX) 10 MG tablet Take 10 mg by mouth 3 (three) times daily as needed for nausea.    [provider]  isosorbide mononitrate (IMDUR) 60 MG 24 hr tablet Take 1 tablet (60 mg total) by mouth daily. 04/17/20   Allred, Fayrene Fearing, MD  latanoprost (XALATAN) 0.005 % ophthalmic solution  02/23/21   [provider]  lisinopril (ZESTRIL) 10 MG tablet Take 10 mg by mouth every evening. 12/10/18   [provider]  LORazepam (ATIVAN) 0.5 MG tablet Take 0.25 mg by mouth every 6 (six) hours as needed. 11/23/22   [provider]  LUMIGAN 0.01 % SOLN Place 1 drop into both eyes. 09/16/22   [provider]  meclizine (ANTIVERT) 25 MG tablet Take 25 mg by mouth  3 (three) times daily as needed for dizziness.    [provider]  memantine (NAMENDA) 10 MG tablet TAKE 1 TABLET BY MOUTH TWICE  DAILY 11/02/21   Ihor Austin, NP  Menthol, Topical Analgesic, (BIOFREEZE) 4 % GEL Apply topically as needed.    [provider]  metFORMIN (GLUCOPHAGE) 1000 MG tablet Take 1,000 mg by mouth 2 (two) times daily with a meal.    [provider]  metoprolol succinate (TOPROL-XL) 25 MG 24 hr tablet Take 1 tablet (25 mg total) by mouth daily. 05/09/22   Mealor, Roberts Gaudy, MD  nitroGLYCERIN (NITROSTAT) 0.4 MG SL tablet Place 1 tablet (0.4 mg total) under the tongue every  5 (five) minutes x 3 doses as needed. 04/15/16   Hillis Range, MD  [facility] VERIO test strip  08/17/17   [provider]  sertraline (ZOLOFT) 100 MG tablet Take 125 mg by mouth daily. 04/19/19   [provider]  simvastatin (ZOCOR) 40 MG tablet Take 40 mg by mouth at bedtime.    [provider]  tamsulosin (FLOMAX) 0.4 MG CAPS capsule Take 0.4 mg by mouth daily. 09/03/20   [provider]      Allergies    Contrast media [iodinated contrast media], Vioxx [rofecoxib], Shellfish allergy, and Sulfonamide derivatives    Review of Systems   Review of Systems  Constitutional: Negative.   HENT: Negative.    Respiratory: Negative.    Cardiovascular: Negative.   Gastrointestinal: Negative.   Genitourinary: Negative.   Musculoskeletal: Negative.        Left hip pain  Skin: Negative.   Neurological: Negative.   All other systems reviewed and are negative.   Physical Exam Updated Vital Signs BP (!) 157/63   Pulse 64   Temp (!) 97.5 F (36.4 C) (Oral)   Resp 15   SpO2 99%  Physical Exam Vitals and nursing note reviewed.  Constitutional:      General: He is not in acute distress.    Appearance: He is well-developed. He is not ill-appearing, toxic-appearing or diaphoretic.  HENT:     Head: Normocephalic and atraumatic.  Eyes:     Pupils: Pupils are equal, round, and reactive to light.  Cardiovascular:     Rate and Rhythm: Normal rate and regular rhythm.     Pulses: Normal pulses.          Radial pulses are 2+ on the right side and 2+ on the left side.       Dorsalis pedis pulses are 2+ on the right side and 2+ on the left side.     Heart sounds: Normal heart sounds.  Pulmonary:     Effort: Pulmonary effort is normal. No respiratory distress.     Breath sounds: Normal breath sounds.  Chest:     Comments: Nontender chest wall, no crepitus or step-off. Abdominal:     General: Bowel sounds are normal. There is no distension.     Palpations: Abdomen is  soft.     Tenderness: There is no abdominal tenderness.     Comments: Soft, nontender, no rebound or guarding  Musculoskeletal:        General: Normal range of motion.     Cervical back: Normal range of motion and neck supple.     Comments: Mild tenderness midline lumbar region, left paraspinal tenderness.  Tenderness anterior left hip.  No shortening or rotation of legs.  Full range of motion.  Skin:    General: Skin is  warm and dry.     Capillary Refill: Capillary refill takes less than 2 seconds.     Comments: No obvious lacerations  Neurological:     General: No focal deficit present.     Mental Status: He is alert and oriented to person, place, and time.     ED Results / Procedures / Treatments   Labs (all labs ordered are listed, but only abnormal results are displayed) Labs Reviewed - No data to display  EKG None  Radiology CT HEAD WO CONTRAST ( )  Result Date: 02/07/2023 CLINICAL DATA:  Fall EXAM: CT HEAD WITHOUT CONTRAST CT CERVICAL SPINE WITHOUT CONTRAST TECHNIQUE: Multidetector CT imaging of the head and cervical spine was performed following the standard protocol without intravenous contrast. Multiplanar CT image reconstructions of the cervical spine were also generated. RADIATION DOSE REDUCTION: This exam was performed according to the departmental dose-optimization program which includes automated exposure control, adjustment of the mA and/or kV according to patient size and/or use of iterative reconstruction technique. COMPARISON:  CT brain and cervical spine 11/28/2022 FINDINGS: CT HEAD FINDINGS Brain: No acute territorial infarction, hemorrhage or intracranial mass. Atrophy and mild chronic small vessel ischemic changes of the white matter. Nonenlarged ventricles. Vascular: No hyperdense vessels.  Carotid vascular calcification Skull: Normal. Negative for fracture or focal lesion. Sinuses/Orbits: No acute finding. Other: None CT CERVICAL SPINE FINDINGS Alignment: No  subluxation.  Facet alignment is normal Skull base and vertebrae: No acute fracture. No primary bone lesion or focal pathologic process. Soft tissues and spinal canal: No prevertebral fluid or swelling. No visible canal hematoma. Disc levels: Multilevel degenerative changes with spurring and facet degenerative changes. Upper chest: Negative. Other: None IMPRESSION: 1. No CT evidence for acute intracranial abnormality. Atrophy and chronic small vessel ischemic changes of the white matter. 2. Degenerative changes of the cervical spine. No acute osseous abnormality. Electronically Signed   By: Jasmine Pang M.D.   On: 02/07/2023 19:11   CT Cervical Spine Wo Contrast  Result Date: 02/07/2023 CLINICAL DATA:  Fall EXAM: CT HEAD WITHOUT CONTRAST CT CERVICAL SPINE WITHOUT CONTRAST TECHNIQUE: Multidetector CT imaging of the head and cervical spine was performed following the standard protocol without intravenous contrast. Multiplanar CT image reconstructions of the cervical spine were also generated. RADIATION DOSE REDUCTION: This exam was performed according to the departmental dose-optimization program which includes automated exposure control, adjustment of the mA and/or kV according to patient size and/or use of iterative reconstruction technique. COMPARISON:  CT brain and cervical spine 11/28/2022 FINDINGS: CT HEAD FINDINGS Brain: No acute territorial infarction, hemorrhage or intracranial mass. Atrophy and mild chronic small vessel ischemic changes of the white matter. Nonenlarged ventricles. Vascular: No hyperdense vessels.  Carotid vascular calcification Skull: Normal. Negative for fracture or focal lesion. Sinuses/Orbits: No acute finding. Other: None CT CERVICAL SPINE FINDINGS Alignment: No subluxation.  Facet alignment is normal Skull base and vertebrae: No acute fracture. No primary bone lesion or focal pathologic process. Soft tissues and spinal canal: No prevertebral fluid or swelling. No visible canal  hematoma. Disc levels: Multilevel degenerative changes with spurring and facet degenerative changes. Upper chest: Negative. Other: None IMPRESSION: 1. No CT evidence for acute intracranial abnormality. Atrophy and chronic small vessel ischemic changes of the white matter. 2. Degenerative changes of the cervical spine. No acute osseous abnormality. Electronically Signed   By: Jasmine Pang M.D.   On: 02/07/2023 19:11   CT L-SPINE NO CHARGE  Result Date: 02/07/2023 CLINICAL DATA:  Poly trauma, blunt.  Back and rib pain after a fall. EXAM: CT LUMBAR SPINE WITHOUT CONTRAST TECHNIQUE: Technique: Multiplanar CT images of the lumbar spine were reconstructed from contemporary CT of the Abdomen and Pelvis. RADIATION DOSE REDUCTION: This exam was performed according to the departmental dose-optimization program which includes automated exposure control, adjustment of the mA and/or kV according to patient size and/or use of iterative reconstruction technique. CONTRAST:  None COMPARISON:  CT chest, abdomen, and pelvis 04/23/2022 FINDINGS: Segmentation: 5 lumbar type vertebrae. Alignment: Normal. Vertebrae: Diffuse osteopenia. Chronic, severe L1 compression fracture with unchanged retropulsion of 7 mm. No acute lumbar spine fracture. Paraspinal and other soft tissues: No acute abnormality identified in the paraspinal soft tissues. Remainder of the abdomen and pelvis reported separately. Disc levels: Mild-to-moderate spinal stenosis at T12-L1 due to L1 retropulsion. Mild spondylosis elsewhere in the lumbar spine with moderate to severe lower lumbar facet hypertrophy not resulting in high-grade spinal stenosis. Mild-to-moderate multilevel neural foraminal stenosis. IMPRESSION: 1. No acute lumbar spine fracture. 2. Chronic L1 compression fracture. Electronically Signed   By: Sebastian Ache M.D.   On: 02/07/2023 19:05   CT CHEST ABDOMEN PELVIS WO CONTRAST  Result Date: 02/07/2023 CLINICAL DATA:  Trauma. EXAM: CT CHEST,  ABDOMEN AND PELVIS WITHOUT CONTRAST TECHNIQUE: Multidetector CT imaging of the chest, abdomen and pelvis was performed following the standard protocol without IV contrast. RADIATION DOSE REDUCTION: This exam was performed according to the departmental dose-optimization program which includes automated exposure control, adjustment of the mA and/or kV according to patient size and/or use of iterative reconstruction technique. COMPARISON:  CT chest abdomen and pelvis 04/23/2022 FINDINGS: CT CHEST FINDINGS Cardiovascular: Heart is enlarged. There is no pericardial effusion. Pacemaker is present. Aorta is normal in size. Mediastinum/Nodes: No enlarged mediastinal, hilar, or axillary lymph nodes. Thyroid gland, trachea, and esophagus demonstrate no significant findings. Lungs/Pleura: There is a small left pleural effusion. There is atelectasis in small amount of patchy airspace disease in the left lower lobe. No pneumothorax. Musculoskeletal: Bones are diffusely osteopenic. There are nondisplaced acute left sixth, seventh and eighth rib fractures. The posterior left tenth and eleventh ribs are fractured in 2 separate places. There is left chest wall edema at the level of the fracture sites. CT ABDOMEN PELVIS FINDINGS Hepatobiliary: Gallstones are present. No biliary ductal dilatation. No focal liver lesion. Pancreas: There is a new low-density cystic area in the head of the pancreas measuring 2.0 x 2.0 cm. No pancreatic ductal dilatation or surrounding inflammatory changes. Diffuse fatty infiltration present. Spleen: No splenic injury or perisplenic hematoma. Mildly enlarged, unchanged. Adrenals/Urinary Tract: No adrenal hemorrhage or renal injury identified. Bladder is unremarkable. There is a 1.3 by 1.2 cm calculus in the superior pole the right kidney. Stomach/Bowel: Stomach is within normal limits. Appendix appears normal. No evidence of bowel wall thickening, distention, or inflammatory changes. There is a large  amount of stool throughout the colon. Vascular/Lymphatic: Aorta and IVC are normal in size. There are atherosclerotic calcifications throughout the aorta. No enlarged lymph nodes are seen. Reproductive: Prostate gland is enlarged. Other: There is trace free fluid in the right lower quadrant. There is no free intraperitoneal air. There is a small fat containing right inguinal hernia. Musculoskeletal: There severe compression fracture of L1 which appears chronic. No acute fractures are seen. The bones are diffusely osteopenic. IMPRESSION: 1. Acute left sixth through eleventh rib fractures. The posterior left tenth and eleventh ribs are fractured in 2 separate places. 2. Small left pleural effusion with left lower lobe atelectasis and  small amount of patchy airspace disease. 3. No acute posttraumatic sequelae in the abdomen or pelvis. 4. New low-density cystic area in the head of the pancreas measuring 2.0 cm. Follow-up MRI recommended. 5. Cholelithiasis. 6. Nonobstructing right renal calculus. 7. Aortic atherosclerosis. Aortic Atherosclerosis (ICD10-I70.0). Electronically Signed   By: Darliss Cheney M.D.   On: 02/07/2023 19:05    Procedures Procedures    Medications Ordered in ED Medications  lidocaine (LIDODERM) 5 % 1 patch (1 patch Transdermal Patch Applied 02/07/23 1711)  oxyCODONE-acetaminophen (PERCOCET/ROXICET) 5-325 MG per tablet 1 tablet (1 tablet Oral Given 02/07/23 1711)   ED Course/ Medical Decision Making/ A&P   77 year old multiple medical problems here for evaluation mechanical fall which occurred 1 week ago.  He has had some lower back pain and left hip pain.  He ambulates with a [place].  States he has been able to ambulate since hours had some pain.  He does have a history of dementia, poor historian.  Per his MAR he is on Eliquis.   Imaging personally viewed and interpreted:  CT head, cervical without significant abnormality CT L-spine with chronic compression fracture CT chest  abdomen pelvis with multiple left-sided rib fractures  Discussed with patient.  Ambulatory, tolerating p.o. intake.  He denies any shortness of breath.  He has no hypoxia, tachypnea or tachycardia  Discussed with Dr. Sheliah Hatch with trauma.  Patient may be discharged.  Patient given incentive spirometer, his pain controlled here.  Will discharge him back to his nursing facility.  The patient has been appropriately medically screened and/or stabilized in the ED. I have low suspicion for any other emergent medical condition which would require further screening, evaluation or treatment in the ED or require inpatient management.  Patient is hemodynamically stable and in no acute distress.  Patient able to ambulate in department prior to ED.  Evaluation does not show acute pathology that would require ongoing or additional emergent interventions while in the emergency department or further inpatient treatment.  I have discussed the diagnosis with the patient and answered all questions.  Pain is been managed while in the emergency department and patient has no further complaints prior to discharge.  Patient is comfortable with plan discussed in room and is stable for discharge at this time.  I have discussed strict return precautions for returning to the emergency department.  Patient was encouraged to follow-up with PCP/specialist refer to at discharge.                                 Medical Decision Making Amount and/or Complexity of Data Reviewed Independent Historian: EMS External Data Reviewed: labs, radiology and notes. Radiology: ordered and independent interpretation performed. Decision-making details documented in ED Course.  Risk OTC drugs. Prescription drug management. Decision regarding hospitalization. Diagnosis or treatment significantly limited by social determinants of health.          Final Clinical Impression(s) / ED Diagnoses Final diagnoses:  Fall, initial encounter   Closed fracture of multiple ribs of left side, initial encounter    Rx / DC Orders ED Discharge Orders          Ordered    oxyCODONE-acetaminophen (PERCOCET/ROXICET) 5-325 MG tablet  Every 6 hours PRN        02/07/23 2017    lidocaine (LIDODERM) 5 %  Every 24 hours        02/07/23 2017  Mariann Palo A, PA-C 02/07/23 2040    Cathren Laine, MD 02/14/23 [phone]

## 2023-02-07 NOTE — ED Notes (Signed)
Assisted pt in using urinal.

## 2023-02-07 NOTE — Discharge Instructions (Addendum)
You have multiple fractures.  I have written for some pain medicine.  Follow-up outpatient, return for any worsening symptoms

## 2023-02-07 NOTE — ED Triage Notes (Signed)
Patient BIB Jay Campbell from [facility] for back and rib pain after a fall last Tuesday. Patient reports he landed flat on his back. BP 158/86 RR 18, 97% RA, HR 64, Patient does not report any difficulty breathing, EMS reports lung sounds clear.

## 2023-02-08 DIAGNOSIS — R2689 Other abnormalities of gait and mobility: Secondary | ICD-10-CM | POA: Diagnosis not present

## 2023-02-08 DIAGNOSIS — M6281 Muscle weakness (generalized): Secondary | ICD-10-CM | POA: Diagnosis not present

## 2023-02-08 DIAGNOSIS — G309 Alzheimer's disease, unspecified: Secondary | ICD-10-CM | POA: Diagnosis not present

## 2023-02-08 DIAGNOSIS — R262 Difficulty in walking, not elsewhere classified: Secondary | ICD-10-CM | POA: Diagnosis not present

## 2023-02-09 DIAGNOSIS — R262 Difficulty in walking, not elsewhere classified: Secondary | ICD-10-CM | POA: Diagnosis not present

## 2023-02-09 DIAGNOSIS — R2689 Other abnormalities of gait and mobility: Secondary | ICD-10-CM | POA: Diagnosis not present

## 2023-02-09 DIAGNOSIS — G309 Alzheimer's disease, unspecified: Secondary | ICD-10-CM | POA: Diagnosis not present

## 2023-02-09 DIAGNOSIS — M6281 Muscle weakness (generalized): Secondary | ICD-10-CM | POA: Diagnosis not present

## 2023-02-10 DIAGNOSIS — S2242XD Multiple fractures of ribs, left side, subsequent encounter for fracture with routine healing: Secondary | ICD-10-CM | POA: Diagnosis not present

## 2023-02-10 DIAGNOSIS — Z79899 Other long term (current) drug therapy: Secondary | ICD-10-CM | POA: Diagnosis not present

## 2023-02-10 DIAGNOSIS — M545 Low back pain, unspecified: Secondary | ICD-10-CM | POA: Diagnosis not present

## 2023-02-13 DIAGNOSIS — D649 Anemia, unspecified: Secondary | ICD-10-CM | POA: Diagnosis not present

## 2023-02-13 DIAGNOSIS — S2241XD Multiple fractures of ribs, right side, subsequent encounter for fracture with routine healing: Secondary | ICD-10-CM | POA: Diagnosis not present

## 2023-02-13 DIAGNOSIS — G309 Alzheimer's disease, unspecified: Secondary | ICD-10-CM | POA: Diagnosis not present

## 2023-02-13 DIAGNOSIS — I2510 Atherosclerotic heart disease of native coronary artery without angina pectoris: Secondary | ICD-10-CM | POA: Diagnosis not present

## 2023-02-13 DIAGNOSIS — M6281 Muscle weakness (generalized): Secondary | ICD-10-CM | POA: Diagnosis not present

## 2023-02-13 DIAGNOSIS — E785 Hyperlipidemia, unspecified: Secondary | ICD-10-CM | POA: Diagnosis not present

## 2023-02-13 DIAGNOSIS — Z950 Presence of cardiac pacemaker: Secondary | ICD-10-CM | POA: Diagnosis not present

## 2023-02-13 DIAGNOSIS — I10 Essential (primary) hypertension: Secondary | ICD-10-CM | POA: Diagnosis not present

## 2023-02-13 DIAGNOSIS — R52 Pain, unspecified: Secondary | ICD-10-CM | POA: Diagnosis not present

## 2023-02-14 ENCOUNTER — Other Ambulatory Visit: Payer: Self-pay | Admitting: Cardiovascular Disease

## 2023-02-14 DIAGNOSIS — G309 Alzheimer's disease, unspecified: Secondary | ICD-10-CM | POA: Diagnosis not present

## 2023-02-14 DIAGNOSIS — M6281 Muscle weakness (generalized): Secondary | ICD-10-CM | POA: Diagnosis not present

## 2023-02-15 DIAGNOSIS — M6281 Muscle weakness (generalized): Secondary | ICD-10-CM | POA: Diagnosis not present

## 2023-02-15 DIAGNOSIS — G309 Alzheimer's disease, unspecified: Secondary | ICD-10-CM | POA: Diagnosis not present

## 2023-02-16 DIAGNOSIS — S2241XD Multiple fractures of ribs, right side, subsequent encounter for fracture with routine healing: Secondary | ICD-10-CM | POA: Diagnosis not present

## 2023-02-16 DIAGNOSIS — M6281 Muscle weakness (generalized): Secondary | ICD-10-CM | POA: Diagnosis not present

## 2023-02-16 DIAGNOSIS — Z79899 Other long term (current) drug therapy: Secondary | ICD-10-CM | POA: Diagnosis not present

## 2023-02-16 DIAGNOSIS — G309 Alzheimer's disease, unspecified: Secondary | ICD-10-CM | POA: Diagnosis not present

## 2023-02-16 DIAGNOSIS — R296 Repeated falls: Secondary | ICD-10-CM | POA: Diagnosis not present

## 2023-02-16 DIAGNOSIS — R52 Pain, unspecified: Secondary | ICD-10-CM | POA: Diagnosis not present

## 2023-02-17 DIAGNOSIS — M6281 Muscle weakness (generalized): Secondary | ICD-10-CM | POA: Diagnosis not present

## 2023-02-17 DIAGNOSIS — G309 Alzheimer's disease, unspecified: Secondary | ICD-10-CM | POA: Diagnosis not present

## 2023-02-20 DIAGNOSIS — G309 Alzheimer's disease, unspecified: Secondary | ICD-10-CM | POA: Diagnosis not present

## 2023-02-20 DIAGNOSIS — M6281 Muscle weakness (generalized): Secondary | ICD-10-CM | POA: Diagnosis not present

## 2023-02-21 DIAGNOSIS — M6281 Muscle weakness (generalized): Secondary | ICD-10-CM | POA: Diagnosis not present

## 2023-02-21 DIAGNOSIS — R0981 Nasal congestion: Secondary | ICD-10-CM | POA: Diagnosis not present

## 2023-02-21 DIAGNOSIS — Z79899 Other long term (current) drug therapy: Secondary | ICD-10-CM | POA: Diagnosis not present

## 2023-02-21 DIAGNOSIS — R400 Somnolence: Secondary | ICD-10-CM | POA: Diagnosis not present

## 2023-02-21 DIAGNOSIS — G309 Alzheimer's disease, unspecified: Secondary | ICD-10-CM | POA: Diagnosis not present

## 2023-02-21 DIAGNOSIS — R040 Epistaxis: Secondary | ICD-10-CM | POA: Diagnosis not present

## 2023-02-21 DIAGNOSIS — R52 Pain, unspecified: Secondary | ICD-10-CM | POA: Diagnosis not present

## 2023-02-22 DIAGNOSIS — I2510 Atherosclerotic heart disease of native coronary artery without angina pectoris: Secondary | ICD-10-CM | POA: Diagnosis not present

## 2023-02-22 DIAGNOSIS — Z950 Presence of cardiac pacemaker: Secondary | ICD-10-CM | POA: Diagnosis not present

## 2023-02-22 DIAGNOSIS — M1A00X0 Idiopathic chronic gout, unspecified site, without tophus (tophi): Secondary | ICD-10-CM | POA: Diagnosis not present

## 2023-02-22 DIAGNOSIS — S2242XD Multiple fractures of ribs, left side, subsequent encounter for fracture with routine healing: Secondary | ICD-10-CM | POA: Diagnosis not present

## 2023-02-22 DIAGNOSIS — E785 Hyperlipidemia, unspecified: Secondary | ICD-10-CM | POA: Diagnosis not present

## 2023-02-22 DIAGNOSIS — D649 Anemia, unspecified: Secondary | ICD-10-CM | POA: Diagnosis not present

## 2023-02-22 DIAGNOSIS — I10 Essential (primary) hypertension: Secondary | ICD-10-CM | POA: Diagnosis not present

## 2023-02-22 DIAGNOSIS — G309 Alzheimer's disease, unspecified: Secondary | ICD-10-CM | POA: Diagnosis not present

## 2023-02-22 DIAGNOSIS — M6281 Muscle weakness (generalized): Secondary | ICD-10-CM | POA: Diagnosis not present

## 2023-02-22 DIAGNOSIS — E119 Type 2 diabetes mellitus without complications: Secondary | ICD-10-CM | POA: Diagnosis not present

## 2023-03-01 DIAGNOSIS — M25552 Pain in left hip: Secondary | ICD-10-CM | POA: Diagnosis not present

## 2023-03-02 DIAGNOSIS — M25552 Pain in left hip: Secondary | ICD-10-CM | POA: Diagnosis not present

## 2023-03-02 DIAGNOSIS — R52 Pain, unspecified: Secondary | ICD-10-CM | POA: Diagnosis not present

## 2023-03-02 DIAGNOSIS — M1612 Unilateral primary osteoarthritis, left hip: Secondary | ICD-10-CM | POA: Diagnosis not present

## 2023-03-02 DIAGNOSIS — Z79899 Other long term (current) drug therapy: Secondary | ICD-10-CM | POA: Diagnosis not present

## 2023-03-02 DIAGNOSIS — M79652 Pain in left thigh: Secondary | ICD-10-CM | POA: Diagnosis not present

## 2023-03-03 DIAGNOSIS — M6281 Muscle weakness (generalized): Secondary | ICD-10-CM | POA: Diagnosis not present

## 2023-03-03 DIAGNOSIS — G309 Alzheimer's disease, unspecified: Secondary | ICD-10-CM | POA: Diagnosis not present

## 2023-03-06 DIAGNOSIS — H409 Unspecified glaucoma: Secondary | ICD-10-CM | POA: Diagnosis not present

## 2023-03-06 DIAGNOSIS — Z79899 Other long term (current) drug therapy: Secondary | ICD-10-CM | POA: Diagnosis not present

## 2023-03-06 DIAGNOSIS — I4891 Unspecified atrial fibrillation: Secondary | ICD-10-CM | POA: Diagnosis not present

## 2023-03-14 DIAGNOSIS — R1312 Dysphagia, oropharyngeal phase: Secondary | ICD-10-CM | POA: Diagnosis not present

## 2023-03-14 DIAGNOSIS — G309 Alzheimer's disease, unspecified: Secondary | ICD-10-CM | POA: Diagnosis not present

## 2023-03-15 DIAGNOSIS — R262 Difficulty in walking, not elsewhere classified: Secondary | ICD-10-CM | POA: Diagnosis not present

## 2023-03-15 DIAGNOSIS — R2689 Other abnormalities of gait and mobility: Secondary | ICD-10-CM | POA: Diagnosis not present

## 2023-03-15 DIAGNOSIS — G309 Alzheimer's disease, unspecified: Secondary | ICD-10-CM | POA: Diagnosis not present

## 2023-03-15 DIAGNOSIS — M6281 Muscle weakness (generalized): Secondary | ICD-10-CM | POA: Diagnosis not present

## 2023-03-15 DIAGNOSIS — R1312 Dysphagia, oropharyngeal phase: Secondary | ICD-10-CM | POA: Diagnosis not present

## 2023-03-16 DIAGNOSIS — R1312 Dysphagia, oropharyngeal phase: Secondary | ICD-10-CM | POA: Diagnosis not present

## 2023-03-16 DIAGNOSIS — R262 Difficulty in walking, not elsewhere classified: Secondary | ICD-10-CM | POA: Diagnosis not present

## 2023-03-16 DIAGNOSIS — G309 Alzheimer's disease, unspecified: Secondary | ICD-10-CM | POA: Diagnosis not present

## 2023-03-16 DIAGNOSIS — R2689 Other abnormalities of gait and mobility: Secondary | ICD-10-CM | POA: Diagnosis not present

## 2023-03-16 DIAGNOSIS — M6281 Muscle weakness (generalized): Secondary | ICD-10-CM | POA: Diagnosis not present

## 2023-03-17 DIAGNOSIS — R262 Difficulty in walking, not elsewhere classified: Secondary | ICD-10-CM | POA: Diagnosis not present

## 2023-03-17 DIAGNOSIS — R2689 Other abnormalities of gait and mobility: Secondary | ICD-10-CM | POA: Diagnosis not present

## 2023-03-17 DIAGNOSIS — G309 Alzheimer's disease, unspecified: Secondary | ICD-10-CM | POA: Diagnosis not present

## 2023-03-17 DIAGNOSIS — R1312 Dysphagia, oropharyngeal phase: Secondary | ICD-10-CM | POA: Diagnosis not present

## 2023-03-17 DIAGNOSIS — M6281 Muscle weakness (generalized): Secondary | ICD-10-CM | POA: Diagnosis not present

## 2023-03-20 DIAGNOSIS — R2689 Other abnormalities of gait and mobility: Secondary | ICD-10-CM | POA: Diagnosis not present

## 2023-03-20 DIAGNOSIS — R1312 Dysphagia, oropharyngeal phase: Secondary | ICD-10-CM | POA: Diagnosis not present

## 2023-03-20 DIAGNOSIS — R262 Difficulty in walking, not elsewhere classified: Secondary | ICD-10-CM | POA: Diagnosis not present

## 2023-03-20 DIAGNOSIS — G309 Alzheimer's disease, unspecified: Secondary | ICD-10-CM | POA: Diagnosis not present

## 2023-03-20 DIAGNOSIS — M6281 Muscle weakness (generalized): Secondary | ICD-10-CM | POA: Diagnosis not present

## 2023-03-21 DIAGNOSIS — G309 Alzheimer's disease, unspecified: Secondary | ICD-10-CM | POA: Diagnosis not present

## 2023-03-21 DIAGNOSIS — R262 Difficulty in walking, not elsewhere classified: Secondary | ICD-10-CM | POA: Diagnosis not present

## 2023-03-21 DIAGNOSIS — M6281 Muscle weakness (generalized): Secondary | ICD-10-CM | POA: Diagnosis not present

## 2023-03-21 DIAGNOSIS — R1312 Dysphagia, oropharyngeal phase: Secondary | ICD-10-CM | POA: Diagnosis not present

## 2023-03-21 DIAGNOSIS — R2689 Other abnormalities of gait and mobility: Secondary | ICD-10-CM | POA: Diagnosis not present

## 2023-03-22 DIAGNOSIS — M6281 Muscle weakness (generalized): Secondary | ICD-10-CM | POA: Diagnosis not present

## 2023-03-22 DIAGNOSIS — G309 Alzheimer's disease, unspecified: Secondary | ICD-10-CM | POA: Diagnosis not present

## 2023-03-22 DIAGNOSIS — R2689 Other abnormalities of gait and mobility: Secondary | ICD-10-CM | POA: Diagnosis not present

## 2023-03-22 DIAGNOSIS — R1312 Dysphagia, oropharyngeal phase: Secondary | ICD-10-CM | POA: Diagnosis not present

## 2023-03-22 DIAGNOSIS — R262 Difficulty in walking, not elsewhere classified: Secondary | ICD-10-CM | POA: Diagnosis not present

## 2023-03-23 DIAGNOSIS — M6281 Muscle weakness (generalized): Secondary | ICD-10-CM | POA: Diagnosis not present

## 2023-03-23 DIAGNOSIS — R262 Difficulty in walking, not elsewhere classified: Secondary | ICD-10-CM | POA: Diagnosis not present

## 2023-03-23 DIAGNOSIS — R1312 Dysphagia, oropharyngeal phase: Secondary | ICD-10-CM | POA: Diagnosis not present

## 2023-03-23 DIAGNOSIS — G309 Alzheimer's disease, unspecified: Secondary | ICD-10-CM | POA: Diagnosis not present

## 2023-03-23 DIAGNOSIS — R2689 Other abnormalities of gait and mobility: Secondary | ICD-10-CM | POA: Diagnosis not present

## 2023-03-24 DIAGNOSIS — R2689 Other abnormalities of gait and mobility: Secondary | ICD-10-CM | POA: Diagnosis not present

## 2023-03-24 DIAGNOSIS — M6281 Muscle weakness (generalized): Secondary | ICD-10-CM | POA: Diagnosis not present

## 2023-03-24 DIAGNOSIS — R262 Difficulty in walking, not elsewhere classified: Secondary | ICD-10-CM | POA: Diagnosis not present

## 2023-03-24 DIAGNOSIS — R1312 Dysphagia, oropharyngeal phase: Secondary | ICD-10-CM | POA: Diagnosis not present

## 2023-03-24 DIAGNOSIS — G309 Alzheimer's disease, unspecified: Secondary | ICD-10-CM | POA: Diagnosis not present

## 2023-03-27 DIAGNOSIS — R2689 Other abnormalities of gait and mobility: Secondary | ICD-10-CM | POA: Diagnosis not present

## 2023-03-27 DIAGNOSIS — M6281 Muscle weakness (generalized): Secondary | ICD-10-CM | POA: Diagnosis not present

## 2023-03-27 DIAGNOSIS — R1312 Dysphagia, oropharyngeal phase: Secondary | ICD-10-CM | POA: Diagnosis not present

## 2023-03-27 DIAGNOSIS — G309 Alzheimer's disease, unspecified: Secondary | ICD-10-CM | POA: Diagnosis not present

## 2023-03-27 DIAGNOSIS — R262 Difficulty in walking, not elsewhere classified: Secondary | ICD-10-CM | POA: Diagnosis not present

## 2023-03-28 DIAGNOSIS — G309 Alzheimer's disease, unspecified: Secondary | ICD-10-CM | POA: Diagnosis not present

## 2023-03-28 DIAGNOSIS — R262 Difficulty in walking, not elsewhere classified: Secondary | ICD-10-CM | POA: Diagnosis not present

## 2023-03-28 DIAGNOSIS — R1312 Dysphagia, oropharyngeal phase: Secondary | ICD-10-CM | POA: Diagnosis not present

## 2023-03-28 DIAGNOSIS — M6281 Muscle weakness (generalized): Secondary | ICD-10-CM | POA: Diagnosis not present

## 2023-03-28 DIAGNOSIS — R2689 Other abnormalities of gait and mobility: Secondary | ICD-10-CM | POA: Diagnosis not present

## 2023-03-29 DIAGNOSIS — I4891 Unspecified atrial fibrillation: Secondary | ICD-10-CM | POA: Diagnosis not present

## 2023-03-29 DIAGNOSIS — E119 Type 2 diabetes mellitus without complications: Secondary | ICD-10-CM | POA: Diagnosis not present

## 2023-03-29 DIAGNOSIS — M1A00X0 Idiopathic chronic gout, unspecified site, without tophus (tophi): Secondary | ICD-10-CM | POA: Diagnosis not present

## 2023-03-29 DIAGNOSIS — D649 Anemia, unspecified: Secondary | ICD-10-CM | POA: Diagnosis not present

## 2023-03-29 DIAGNOSIS — G309 Alzheimer's disease, unspecified: Secondary | ICD-10-CM | POA: Diagnosis not present

## 2023-03-29 DIAGNOSIS — E785 Hyperlipidemia, unspecified: Secondary | ICD-10-CM | POA: Diagnosis not present

## 2023-03-29 DIAGNOSIS — I10 Essential (primary) hypertension: Secondary | ICD-10-CM | POA: Diagnosis not present

## 2023-03-29 DIAGNOSIS — M1612 Unilateral primary osteoarthritis, left hip: Secondary | ICD-10-CM | POA: Diagnosis not present

## 2023-03-29 DIAGNOSIS — K219 Gastro-esophageal reflux disease without esophagitis: Secondary | ICD-10-CM | POA: Diagnosis not present

## 2023-03-29 DIAGNOSIS — I5042 Chronic combined systolic (congestive) and diastolic (congestive) heart failure: Secondary | ICD-10-CM | POA: Diagnosis not present

## 2023-03-30 DIAGNOSIS — I5020 Unspecified systolic (congestive) heart failure: Secondary | ICD-10-CM | POA: Diagnosis not present

## 2023-04-14 ENCOUNTER — Ambulatory Visit: Payer: Medicare Other

## 2023-04-26 DIAGNOSIS — G309 Alzheimer's disease, unspecified: Secondary | ICD-10-CM | POA: Diagnosis not present

## 2023-04-26 DIAGNOSIS — E785 Hyperlipidemia, unspecified: Secondary | ICD-10-CM | POA: Diagnosis not present

## 2023-04-26 DIAGNOSIS — E119 Type 2 diabetes mellitus without complications: Secondary | ICD-10-CM | POA: Diagnosis not present

## 2023-04-26 DIAGNOSIS — I4891 Unspecified atrial fibrillation: Secondary | ICD-10-CM | POA: Diagnosis not present

## 2023-04-26 DIAGNOSIS — D649 Anemia, unspecified: Secondary | ICD-10-CM | POA: Diagnosis not present

## 2023-04-26 DIAGNOSIS — K219 Gastro-esophageal reflux disease without esophagitis: Secondary | ICD-10-CM | POA: Diagnosis not present

## 2023-04-26 DIAGNOSIS — Z950 Presence of cardiac pacemaker: Secondary | ICD-10-CM | POA: Diagnosis not present

## 2023-04-26 DIAGNOSIS — M1612 Unilateral primary osteoarthritis, left hip: Secondary | ICD-10-CM | POA: Diagnosis not present

## 2023-04-26 DIAGNOSIS — I10 Essential (primary) hypertension: Secondary | ICD-10-CM | POA: Diagnosis not present

## 2023-04-27 ENCOUNTER — Ambulatory Visit (INDEPENDENT_AMBULATORY_CARE_PROVIDER_SITE_OTHER): Payer: Medicare Other

## 2023-04-27 DIAGNOSIS — I442 Atrioventricular block, complete: Secondary | ICD-10-CM | POA: Diagnosis not present

## 2023-04-29 LAB — CUP PACEART REMOTE DEVICE CHECK
Battery Remaining Longevity: 30 mo
Battery Remaining Percentage: 22 %
Battery Voltage: 2.93 V
Brady Statistic RV Percent Paced: 99 %
Date Time Interrogation Session: 20250213182359
Lead Channel Impedance Value: 560 Ohm
Lead Channel Pacing Threshold Amplitude: 0.875 V
Lead Channel Pacing Threshold Pulse Width: 0.4 ms
Lead Channel Sensing Intrinsic Amplitude: 12 mV
Lead Channel Setting Pacing Amplitude: 1.125
Lead Channel Setting Pacing Pulse Width: 0.4 ms
Lead Channel Setting Sensing Sensitivity: 6 mV
Pulse Gen Model: 2240
Pulse Gen Serial Number: 7488472

## 2023-05-01 DIAGNOSIS — M79641 Pain in right hand: Secondary | ICD-10-CM | POA: Diagnosis not present

## 2023-05-01 DIAGNOSIS — M1909 Primary osteoarthritis, other specified site: Secondary | ICD-10-CM | POA: Diagnosis not present

## 2023-05-01 DIAGNOSIS — M1A00X0 Idiopathic chronic gout, unspecified site, without tophus (tophi): Secondary | ICD-10-CM | POA: Diagnosis not present

## 2023-05-01 DIAGNOSIS — Z79899 Other long term (current) drug therapy: Secondary | ICD-10-CM | POA: Diagnosis not present

## 2023-05-01 DIAGNOSIS — M79644 Pain in right finger(s): Secondary | ICD-10-CM | POA: Diagnosis not present

## 2023-05-01 DIAGNOSIS — R2231 Localized swelling, mass and lump, right upper limb: Secondary | ICD-10-CM | POA: Diagnosis not present

## 2023-05-01 DIAGNOSIS — R52 Pain, unspecified: Secondary | ICD-10-CM | POA: Diagnosis not present

## 2023-05-10 DIAGNOSIS — H524 Presbyopia: Secondary | ICD-10-CM | POA: Diagnosis not present

## 2023-05-10 DIAGNOSIS — E119 Type 2 diabetes mellitus without complications: Secondary | ICD-10-CM | POA: Diagnosis not present

## 2023-05-10 DIAGNOSIS — H26493 Other secondary cataract, bilateral: Secondary | ICD-10-CM | POA: Diagnosis not present

## 2023-05-10 DIAGNOSIS — H401134 Primary open-angle glaucoma, bilateral, indeterminate stage: Secondary | ICD-10-CM | POA: Diagnosis not present

## 2023-05-11 ENCOUNTER — Encounter: Payer: Self-pay | Admitting: Cardiovascular Disease

## 2023-05-17 NOTE — Addendum Note (Signed)
 Addended by: Cynda Acres A on: 05/17/2023 10:05 AM   Modules accepted: Orders

## 2023-05-18 ENCOUNTER — Other Ambulatory Visit: Payer: Self-pay

## 2023-05-18 ENCOUNTER — Inpatient Hospital Stay: Attending: Hematology

## 2023-05-18 ENCOUNTER — Other Ambulatory Visit: Payer: Medicare Other

## 2023-05-18 DIAGNOSIS — D539 Nutritional anemia, unspecified: Secondary | ICD-10-CM

## 2023-05-18 DIAGNOSIS — D509 Iron deficiency anemia, unspecified: Secondary | ICD-10-CM

## 2023-05-18 DIAGNOSIS — Z79899 Other long term (current) drug therapy: Secondary | ICD-10-CM | POA: Insufficient documentation

## 2023-05-18 DIAGNOSIS — D472 Monoclonal gammopathy: Secondary | ICD-10-CM

## 2023-05-18 DIAGNOSIS — D696 Thrombocytopenia, unspecified: Secondary | ICD-10-CM

## 2023-05-18 DIAGNOSIS — D649 Anemia, unspecified: Secondary | ICD-10-CM | POA: Diagnosis not present

## 2023-05-18 LAB — CBC
HCT: 34.6 % — ABNORMAL LOW (ref 39.0–52.0)
Hemoglobin: 11.5 g/dL — ABNORMAL LOW (ref 13.0–17.0)
MCH: 36.3 pg — ABNORMAL HIGH (ref 26.0–34.0)
MCHC: 33.2 g/dL (ref 30.0–36.0)
MCV: 109.1 fL — ABNORMAL HIGH (ref 80.0–100.0)
Platelets: 90 10*3/uL — ABNORMAL LOW (ref 150–400)
RBC: 3.17 MIL/uL — ABNORMAL LOW (ref 4.22–5.81)
RDW: 12.9 % (ref 11.5–15.5)
WBC: 3.3 10*3/uL — ABNORMAL LOW (ref 4.0–10.5)
nRBC: 0 % (ref 0.0–0.2)

## 2023-05-18 LAB — COMPREHENSIVE METABOLIC PANEL
ALT: 17 U/L (ref 0–44)
AST: 24 U/L (ref 15–41)
Albumin: 3.8 g/dL (ref 3.5–5.0)
Alkaline Phosphatase: 146 U/L — ABNORMAL HIGH (ref 38–126)
Anion gap: 11 (ref 5–15)
BUN: 27 mg/dL — ABNORMAL HIGH (ref 8–23)
CO2: 23 mmol/L (ref 22–32)
Calcium: 9.1 mg/dL (ref 8.9–10.3)
Chloride: 103 mmol/L (ref 98–111)
Creatinine, Ser: 1.05 mg/dL (ref 0.61–1.24)
GFR, Estimated: >60 mL/min (ref >60)
Glucose, Bld: 234 mg/dL — ABNORMAL HIGH (ref 70–99)
Potassium: 4.5 mmol/L (ref 3.5–5.1)
Sodium: 137 mmol/L (ref 135–145)
Total Bilirubin: 0.9 mg/dL (ref 0.0–1.2)
Total Protein: 8 g/dL (ref 6.5–8.1)

## 2023-05-18 LAB — IRON AND TIBC
Iron: 76 ug/dL (ref 45–182)
Saturation Ratios: 27 % (ref 17.9–39.5)
TIBC: 280 ug/dL (ref 250–450)
UIBC: 204 ug/dL

## 2023-05-18 LAB — FERRITIN: Ferritin: 239 ng/mL (ref 24–336)

## 2023-05-19 LAB — KAPPA/LAMBDA LIGHT CHAINS
Kappa free light chain: 58.3 mg/L — ABNORMAL HIGH (ref 3.3–19.4)
Kappa, lambda light chain ratio: 2.37 — ABNORMAL HIGH (ref 0.26–1.65)
Lambda free light chains: 24.6 mg/L (ref 5.7–26.3)

## 2023-05-22 LAB — PROTEIN ELECTROPHORESIS, SERUM
A/G Ratio: 0.9 (ref 0.7–1.7)
Albumin ELP: 3.7 g/dL (ref 2.9–4.4)
Alpha-1-Globulin: 0.2 g/dL (ref 0.0–0.4)
Alpha-2-Globulin: 0.6 g/dL (ref 0.4–1.0)
Beta Globulin: 0.9 g/dL (ref 0.7–1.3)
Gamma Globulin: 2.2 g/dL — ABNORMAL HIGH (ref 0.4–1.8)
Globulin, Total: 3.9 g/dL (ref 2.2–3.9)
M-Spike, %: 1.5 g/dL — ABNORMAL HIGH
Total Protein ELP: 7.6 g/dL (ref 6.0–8.5)

## 2023-05-23 DIAGNOSIS — Z79899 Other long term (current) drug therapy: Secondary | ICD-10-CM | POA: Diagnosis not present

## 2023-05-23 DIAGNOSIS — I10 Essential (primary) hypertension: Secondary | ICD-10-CM | POA: Diagnosis not present

## 2023-05-23 DIAGNOSIS — Z950 Presence of cardiac pacemaker: Secondary | ICD-10-CM | POA: Diagnosis not present

## 2023-05-23 DIAGNOSIS — I2510 Atherosclerotic heart disease of native coronary artery without angina pectoris: Secondary | ICD-10-CM | POA: Diagnosis not present

## 2023-05-25 ENCOUNTER — Inpatient Hospital Stay: Payer: Medicare Other | Admitting: Hematology

## 2023-05-30 DIAGNOSIS — I2510 Atherosclerotic heart disease of native coronary artery without angina pectoris: Secondary | ICD-10-CM | POA: Diagnosis not present

## 2023-05-30 DIAGNOSIS — G309 Alzheimer's disease, unspecified: Secondary | ICD-10-CM | POA: Diagnosis not present

## 2023-05-30 DIAGNOSIS — Z950 Presence of cardiac pacemaker: Secondary | ICD-10-CM | POA: Diagnosis not present

## 2023-05-30 DIAGNOSIS — I10 Essential (primary) hypertension: Secondary | ICD-10-CM | POA: Diagnosis not present

## 2023-05-30 DIAGNOSIS — I4891 Unspecified atrial fibrillation: Secondary | ICD-10-CM | POA: Diagnosis not present

## 2023-05-30 DIAGNOSIS — Z79899 Other long term (current) drug therapy: Secondary | ICD-10-CM | POA: Diagnosis not present

## 2023-05-31 NOTE — Addendum Note (Signed)
 Addended by: Elease Etienne A on: 05/31/2023 09:37 AM   Modules accepted: Orders

## 2023-05-31 NOTE — Progress Notes (Signed)
 Remote pacemaker transmission.

## 2023-06-06 DIAGNOSIS — M6281 Muscle weakness (generalized): Secondary | ICD-10-CM | POA: Diagnosis not present

## 2023-06-06 DIAGNOSIS — G309 Alzheimer's disease, unspecified: Secondary | ICD-10-CM | POA: Diagnosis not present

## 2023-06-07 DIAGNOSIS — M6281 Muscle weakness (generalized): Secondary | ICD-10-CM | POA: Diagnosis not present

## 2023-06-07 DIAGNOSIS — G309 Alzheimer's disease, unspecified: Secondary | ICD-10-CM | POA: Diagnosis not present

## 2023-06-08 DIAGNOSIS — Z79899 Other long term (current) drug therapy: Secondary | ICD-10-CM | POA: Diagnosis not present

## 2023-06-08 DIAGNOSIS — G8929 Other chronic pain: Secondary | ICD-10-CM | POA: Diagnosis not present

## 2023-06-08 DIAGNOSIS — R0981 Nasal congestion: Secondary | ICD-10-CM | POA: Diagnosis not present

## 2023-06-08 DIAGNOSIS — G309 Alzheimer's disease, unspecified: Secondary | ICD-10-CM | POA: Diagnosis not present

## 2023-06-08 DIAGNOSIS — R040 Epistaxis: Secondary | ICD-10-CM | POA: Diagnosis not present

## 2023-06-08 DIAGNOSIS — M6281 Muscle weakness (generalized): Secondary | ICD-10-CM | POA: Diagnosis not present

## 2023-06-09 DIAGNOSIS — M6281 Muscle weakness (generalized): Secondary | ICD-10-CM | POA: Diagnosis not present

## 2023-06-09 DIAGNOSIS — G309 Alzheimer's disease, unspecified: Secondary | ICD-10-CM | POA: Diagnosis not present

## 2023-06-12 DIAGNOSIS — G309 Alzheimer's disease, unspecified: Secondary | ICD-10-CM | POA: Diagnosis not present

## 2023-06-12 DIAGNOSIS — M6281 Muscle weakness (generalized): Secondary | ICD-10-CM | POA: Diagnosis not present

## 2023-06-13 DIAGNOSIS — G309 Alzheimer's disease, unspecified: Secondary | ICD-10-CM | POA: Diagnosis not present

## 2023-06-13 DIAGNOSIS — M6281 Muscle weakness (generalized): Secondary | ICD-10-CM | POA: Diagnosis not present

## 2023-06-14 DIAGNOSIS — D649 Anemia, unspecified: Secondary | ICD-10-CM | POA: Diagnosis not present

## 2023-06-14 DIAGNOSIS — G309 Alzheimer's disease, unspecified: Secondary | ICD-10-CM | POA: Diagnosis not present

## 2023-06-14 DIAGNOSIS — E119 Type 2 diabetes mellitus without complications: Secondary | ICD-10-CM | POA: Diagnosis not present

## 2023-06-14 DIAGNOSIS — I5042 Chronic combined systolic (congestive) and diastolic (congestive) heart failure: Secondary | ICD-10-CM | POA: Diagnosis not present

## 2023-06-14 DIAGNOSIS — K219 Gastro-esophageal reflux disease without esophagitis: Secondary | ICD-10-CM | POA: Diagnosis not present

## 2023-06-14 DIAGNOSIS — I10 Essential (primary) hypertension: Secondary | ICD-10-CM | POA: Diagnosis not present

## 2023-06-14 DIAGNOSIS — I4891 Unspecified atrial fibrillation: Secondary | ICD-10-CM | POA: Diagnosis not present

## 2023-06-14 DIAGNOSIS — E785 Hyperlipidemia, unspecified: Secondary | ICD-10-CM | POA: Diagnosis not present

## 2023-06-14 DIAGNOSIS — Z950 Presence of cardiac pacemaker: Secondary | ICD-10-CM | POA: Diagnosis not present

## 2023-06-14 DIAGNOSIS — M6281 Muscle weakness (generalized): Secondary | ICD-10-CM | POA: Diagnosis not present

## 2023-06-14 DIAGNOSIS — M1909 Primary osteoarthritis, other specified site: Secondary | ICD-10-CM | POA: Diagnosis not present

## 2023-06-15 DIAGNOSIS — G309 Alzheimer's disease, unspecified: Secondary | ICD-10-CM | POA: Diagnosis not present

## 2023-06-15 DIAGNOSIS — M6281 Muscle weakness (generalized): Secondary | ICD-10-CM | POA: Diagnosis not present

## 2023-06-16 DIAGNOSIS — G309 Alzheimer's disease, unspecified: Secondary | ICD-10-CM | POA: Diagnosis not present

## 2023-06-16 DIAGNOSIS — M6281 Muscle weakness (generalized): Secondary | ICD-10-CM | POA: Diagnosis not present

## 2023-06-19 DIAGNOSIS — G309 Alzheimer's disease, unspecified: Secondary | ICD-10-CM | POA: Diagnosis not present

## 2023-06-19 DIAGNOSIS — M6281 Muscle weakness (generalized): Secondary | ICD-10-CM | POA: Diagnosis not present

## 2023-06-30 DIAGNOSIS — D531 Other megaloblastic anemias, not elsewhere classified: Secondary | ICD-10-CM | POA: Diagnosis not present

## 2023-06-30 DIAGNOSIS — Z1152 Encounter for screening for COVID-19: Secondary | ICD-10-CM | POA: Diagnosis not present

## 2023-06-30 DIAGNOSIS — D696 Thrombocytopenia, unspecified: Secondary | ICD-10-CM | POA: Diagnosis not present

## 2023-06-30 DIAGNOSIS — D539 Nutritional anemia, unspecified: Secondary | ICD-10-CM | POA: Diagnosis not present

## 2023-06-30 DIAGNOSIS — N179 Acute kidney failure, unspecified: Secondary | ICD-10-CM | POA: Diagnosis not present

## 2023-06-30 DIAGNOSIS — G8929 Other chronic pain: Secondary | ICD-10-CM | POA: Diagnosis not present

## 2023-06-30 DIAGNOSIS — R509 Fever, unspecified: Secondary | ICD-10-CM | POA: Diagnosis not present

## 2023-06-30 DIAGNOSIS — Z87891 Personal history of nicotine dependence: Secondary | ICD-10-CM | POA: Diagnosis not present

## 2023-06-30 DIAGNOSIS — R410 Disorientation, unspecified: Secondary | ICD-10-CM | POA: Diagnosis not present

## 2023-06-30 DIAGNOSIS — R9431 Abnormal electrocardiogram [ECG] [EKG]: Secondary | ICD-10-CM | POA: Diagnosis not present

## 2023-06-30 DIAGNOSIS — E119 Type 2 diabetes mellitus without complications: Secondary | ICD-10-CM | POA: Diagnosis not present

## 2023-06-30 DIAGNOSIS — Z950 Presence of cardiac pacemaker: Secondary | ICD-10-CM | POA: Diagnosis not present

## 2023-06-30 DIAGNOSIS — I4891 Unspecified atrial fibrillation: Secondary | ICD-10-CM | POA: Diagnosis not present

## 2023-06-30 DIAGNOSIS — Z7901 Long term (current) use of anticoagulants: Secondary | ICD-10-CM | POA: Diagnosis not present

## 2023-06-30 DIAGNOSIS — I509 Heart failure, unspecified: Secondary | ICD-10-CM | POA: Diagnosis not present

## 2023-06-30 DIAGNOSIS — Z9181 History of falling: Secondary | ICD-10-CM | POA: Diagnosis not present

## 2023-06-30 DIAGNOSIS — Z743 Need for continuous supervision: Secondary | ICD-10-CM | POA: Diagnosis not present

## 2023-06-30 DIAGNOSIS — G4733 Obstructive sleep apnea (adult) (pediatric): Secondary | ICD-10-CM | POA: Diagnosis not present

## 2023-06-30 DIAGNOSIS — Z79899 Other long term (current) drug therapy: Secondary | ICD-10-CM | POA: Diagnosis not present

## 2023-06-30 DIAGNOSIS — J90 Pleural effusion, not elsewhere classified: Secondary | ICD-10-CM | POA: Diagnosis not present

## 2023-06-30 DIAGNOSIS — R400 Somnolence: Secondary | ICD-10-CM | POA: Diagnosis not present

## 2023-06-30 DIAGNOSIS — D649 Anemia, unspecified: Secondary | ICD-10-CM | POA: Diagnosis not present

## 2023-06-30 DIAGNOSIS — K219 Gastro-esophageal reflux disease without esophagitis: Secondary | ICD-10-CM | POA: Diagnosis not present

## 2023-06-30 DIAGNOSIS — E785 Hyperlipidemia, unspecified: Secondary | ICD-10-CM | POA: Diagnosis not present

## 2023-06-30 DIAGNOSIS — M109 Gout, unspecified: Secondary | ICD-10-CM | POA: Diagnosis not present

## 2023-06-30 DIAGNOSIS — R2681 Unsteadiness on feet: Secondary | ICD-10-CM | POA: Diagnosis not present

## 2023-06-30 DIAGNOSIS — R0989 Other specified symptoms and signs involving the circulatory and respiratory systems: Secondary | ICD-10-CM | POA: Diagnosis not present

## 2023-06-30 DIAGNOSIS — R918 Other nonspecific abnormal finding of lung field: Secondary | ICD-10-CM | POA: Diagnosis not present

## 2023-06-30 DIAGNOSIS — Z20822 Contact with and (suspected) exposure to covid-19: Secondary | ICD-10-CM | POA: Diagnosis not present

## 2023-06-30 DIAGNOSIS — Z794 Long term (current) use of insulin: Secondary | ICD-10-CM | POA: Diagnosis not present

## 2023-07-05 DIAGNOSIS — I10 Essential (primary) hypertension: Secondary | ICD-10-CM | POA: Diagnosis not present

## 2023-07-05 DIAGNOSIS — I4891 Unspecified atrial fibrillation: Secondary | ICD-10-CM | POA: Diagnosis not present

## 2023-07-05 DIAGNOSIS — E785 Hyperlipidemia, unspecified: Secondary | ICD-10-CM | POA: Diagnosis not present

## 2023-07-05 DIAGNOSIS — G309 Alzheimer's disease, unspecified: Secondary | ICD-10-CM | POA: Diagnosis not present

## 2023-07-05 DIAGNOSIS — Z950 Presence of cardiac pacemaker: Secondary | ICD-10-CM | POA: Diagnosis not present

## 2023-07-05 DIAGNOSIS — E119 Type 2 diabetes mellitus without complications: Secondary | ICD-10-CM | POA: Diagnosis not present

## 2023-07-05 DIAGNOSIS — M1909 Primary osteoarthritis, other specified site: Secondary | ICD-10-CM | POA: Diagnosis not present

## 2023-07-05 DIAGNOSIS — M1A00X0 Idiopathic chronic gout, unspecified site, without tophus (tophi): Secondary | ICD-10-CM | POA: Diagnosis not present

## 2023-07-05 DIAGNOSIS — E108 Type 1 diabetes mellitus with unspecified complications: Secondary | ICD-10-CM | POA: Diagnosis not present

## 2023-07-05 DIAGNOSIS — D696 Thrombocytopenia, unspecified: Secondary | ICD-10-CM | POA: Diagnosis not present

## 2023-07-05 DIAGNOSIS — I5042 Chronic combined systolic (congestive) and diastolic (congestive) heart failure: Secondary | ICD-10-CM | POA: Diagnosis not present

## 2023-07-06 DIAGNOSIS — N179 Acute kidney failure, unspecified: Secondary | ICD-10-CM | POA: Diagnosis not present

## 2023-07-14 ENCOUNTER — Ambulatory Visit: Payer: Medicare Other

## 2023-07-17 DIAGNOSIS — D649 Anemia, unspecified: Secondary | ICD-10-CM | POA: Diagnosis not present

## 2023-07-17 DIAGNOSIS — Z79899 Other long term (current) drug therapy: Secondary | ICD-10-CM | POA: Diagnosis not present

## 2023-07-20 DIAGNOSIS — R600 Localized edema: Secondary | ICD-10-CM | POA: Diagnosis not present

## 2023-07-20 DIAGNOSIS — Z79899 Other long term (current) drug therapy: Secondary | ICD-10-CM | POA: Diagnosis not present

## 2023-07-25 DIAGNOSIS — I82491 Acute embolism and thrombosis of other specified deep vein of right lower extremity: Secondary | ICD-10-CM | POA: Diagnosis not present

## 2023-07-26 DIAGNOSIS — Z79899 Other long term (current) drug therapy: Secondary | ICD-10-CM | POA: Diagnosis not present

## 2023-07-27 ENCOUNTER — Ambulatory Visit: Payer: Medicare Other

## 2023-07-27 DIAGNOSIS — Z23 Encounter for immunization: Secondary | ICD-10-CM | POA: Diagnosis not present

## 2023-08-08 DIAGNOSIS — Z79899 Other long term (current) drug therapy: Secondary | ICD-10-CM | POA: Diagnosis not present

## 2023-08-10 DIAGNOSIS — Z79899 Other long term (current) drug therapy: Secondary | ICD-10-CM | POA: Diagnosis not present

## 2023-08-10 DIAGNOSIS — K219 Gastro-esophageal reflux disease without esophagitis: Secondary | ICD-10-CM | POA: Diagnosis not present

## 2023-08-10 DIAGNOSIS — G309 Alzheimer's disease, unspecified: Secondary | ICD-10-CM | POA: Diagnosis not present

## 2023-08-10 DIAGNOSIS — I110 Hypertensive heart disease with heart failure: Secondary | ICD-10-CM | POA: Diagnosis not present

## 2023-08-10 DIAGNOSIS — N390 Urinary tract infection, site not specified: Secondary | ICD-10-CM | POA: Diagnosis not present

## 2023-08-16 DIAGNOSIS — M6281 Muscle weakness (generalized): Secondary | ICD-10-CM | POA: Diagnosis not present

## 2023-08-16 DIAGNOSIS — G309 Alzheimer's disease, unspecified: Secondary | ICD-10-CM | POA: Diagnosis not present

## 2023-08-16 DIAGNOSIS — R269 Unspecified abnormalities of gait and mobility: Secondary | ICD-10-CM | POA: Diagnosis not present

## 2023-08-16 DIAGNOSIS — R2689 Other abnormalities of gait and mobility: Secondary | ICD-10-CM | POA: Diagnosis not present

## 2023-08-16 DIAGNOSIS — R296 Repeated falls: Secondary | ICD-10-CM | POA: Diagnosis not present

## 2023-08-18 DIAGNOSIS — Z79899 Other long term (current) drug therapy: Secondary | ICD-10-CM | POA: Diagnosis not present

## 2023-08-18 DIAGNOSIS — N390 Urinary tract infection, site not specified: Secondary | ICD-10-CM | POA: Diagnosis not present

## 2023-08-22 DIAGNOSIS — Z79899 Other long term (current) drug therapy: Secondary | ICD-10-CM | POA: Diagnosis not present

## 2023-08-22 DIAGNOSIS — N390 Urinary tract infection, site not specified: Secondary | ICD-10-CM | POA: Diagnosis not present

## 2023-08-28 DIAGNOSIS — K219 Gastro-esophageal reflux disease without esophagitis: Secondary | ICD-10-CM | POA: Diagnosis not present

## 2023-08-28 DIAGNOSIS — I5042 Chronic combined systolic (congestive) and diastolic (congestive) heart failure: Secondary | ICD-10-CM | POA: Diagnosis not present

## 2023-08-28 DIAGNOSIS — Z79899 Other long term (current) drug therapy: Secondary | ICD-10-CM | POA: Diagnosis not present

## 2023-08-28 DIAGNOSIS — E119 Type 2 diabetes mellitus without complications: Secondary | ICD-10-CM | POA: Diagnosis not present

## 2023-08-29 DIAGNOSIS — Z79899 Other long term (current) drug therapy: Secondary | ICD-10-CM | POA: Diagnosis not present

## 2023-09-04 NOTE — Progress Notes (Signed)
   09/04/2023  Patient ID: Jay Campbell, male   DOB: 1945-06-03, 78 y.o.   MRN: [id number]   Reviewed patient regarding medication adherence from a quality report for [facility].    Per [facility] and payer portal fill history:  Simvastatin  40 mg - last filled 08/31/23 for a 30-day supply. Lisinopril 30 mg - last filled 08/31/23 for an 30-day supply.  I will follow up for adherence monitoring. Thanks for allowing pharmacy to be a part of this patient's care.   Heather Factor, PharmD Clinical Pharmacist  [phone]

## 2023-09-28 DIAGNOSIS — L98491 Non-pressure chronic ulcer of skin of other sites limited to breakdown of skin: Secondary | ICD-10-CM | POA: Diagnosis not present

## 2023-09-28 DIAGNOSIS — E119 Type 2 diabetes mellitus without complications: Secondary | ICD-10-CM | POA: Diagnosis not present

## 2023-10-04 DIAGNOSIS — R2689 Other abnormalities of gait and mobility: Secondary | ICD-10-CM | POA: Diagnosis not present

## 2023-10-04 DIAGNOSIS — M1909 Primary osteoarthritis, other specified site: Secondary | ICD-10-CM | POA: Diagnosis not present

## 2023-10-04 DIAGNOSIS — M6281 Muscle weakness (generalized): Secondary | ICD-10-CM | POA: Diagnosis not present

## 2023-10-04 DIAGNOSIS — R296 Repeated falls: Secondary | ICD-10-CM | POA: Diagnosis not present

## 2023-10-04 DIAGNOSIS — R269 Unspecified abnormalities of gait and mobility: Secondary | ICD-10-CM | POA: Diagnosis not present

## 2023-10-04 DIAGNOSIS — E119 Type 2 diabetes mellitus without complications: Secondary | ICD-10-CM | POA: Diagnosis not present

## 2023-10-04 DIAGNOSIS — D649 Anemia, unspecified: Secondary | ICD-10-CM | POA: Diagnosis not present

## 2023-10-04 DIAGNOSIS — I2510 Atherosclerotic heart disease of native coronary artery without angina pectoris: Secondary | ICD-10-CM | POA: Diagnosis not present

## 2023-10-04 DIAGNOSIS — D696 Thrombocytopenia, unspecified: Secondary | ICD-10-CM | POA: Diagnosis not present

## 2023-10-04 DIAGNOSIS — H409 Unspecified glaucoma: Secondary | ICD-10-CM | POA: Diagnosis not present

## 2023-10-04 DIAGNOSIS — G309 Alzheimer's disease, unspecified: Secondary | ICD-10-CM | POA: Diagnosis not present

## 2023-10-04 DIAGNOSIS — I5042 Chronic combined systolic (congestive) and diastolic (congestive) heart failure: Secondary | ICD-10-CM | POA: Diagnosis not present

## 2023-10-05 DIAGNOSIS — G309 Alzheimer's disease, unspecified: Secondary | ICD-10-CM | POA: Diagnosis not present

## 2023-10-05 DIAGNOSIS — M6281 Muscle weakness (generalized): Secondary | ICD-10-CM | POA: Diagnosis not present

## 2023-10-05 DIAGNOSIS — D649 Anemia, unspecified: Secondary | ICD-10-CM | POA: Diagnosis not present

## 2023-10-05 DIAGNOSIS — D696 Thrombocytopenia, unspecified: Secondary | ICD-10-CM | POA: Diagnosis not present

## 2023-10-05 DIAGNOSIS — I5042 Chronic combined systolic (congestive) and diastolic (congestive) heart failure: Secondary | ICD-10-CM | POA: Diagnosis not present

## 2023-10-05 DIAGNOSIS — R2689 Other abnormalities of gait and mobility: Secondary | ICD-10-CM | POA: Diagnosis not present

## 2023-10-05 DIAGNOSIS — R269 Unspecified abnormalities of gait and mobility: Secondary | ICD-10-CM | POA: Diagnosis not present

## 2023-10-05 DIAGNOSIS — R296 Repeated falls: Secondary | ICD-10-CM | POA: Diagnosis not present

## 2023-10-05 DIAGNOSIS — Z79899 Other long term (current) drug therapy: Secondary | ICD-10-CM | POA: Diagnosis not present

## 2023-10-06 ENCOUNTER — Encounter: Payer: Self-pay | Admitting: Hematology

## 2023-10-06 DIAGNOSIS — R296 Repeated falls: Secondary | ICD-10-CM | POA: Diagnosis not present

## 2023-10-06 DIAGNOSIS — M6281 Muscle weakness (generalized): Secondary | ICD-10-CM | POA: Diagnosis not present

## 2023-10-06 DIAGNOSIS — G309 Alzheimer's disease, unspecified: Secondary | ICD-10-CM | POA: Diagnosis not present

## 2023-10-06 DIAGNOSIS — R2689 Other abnormalities of gait and mobility: Secondary | ICD-10-CM | POA: Diagnosis not present

## 2023-10-06 DIAGNOSIS — R269 Unspecified abnormalities of gait and mobility: Secondary | ICD-10-CM | POA: Diagnosis not present

## 2023-10-08 DIAGNOSIS — R2689 Other abnormalities of gait and mobility: Secondary | ICD-10-CM | POA: Diagnosis not present

## 2023-10-08 DIAGNOSIS — R296 Repeated falls: Secondary | ICD-10-CM | POA: Diagnosis not present

## 2023-10-08 DIAGNOSIS — R269 Unspecified abnormalities of gait and mobility: Secondary | ICD-10-CM | POA: Diagnosis not present

## 2023-10-08 DIAGNOSIS — M6281 Muscle weakness (generalized): Secondary | ICD-10-CM | POA: Diagnosis not present

## 2023-10-08 DIAGNOSIS — G309 Alzheimer's disease, unspecified: Secondary | ICD-10-CM | POA: Diagnosis not present

## 2023-10-09 DIAGNOSIS — R296 Repeated falls: Secondary | ICD-10-CM | POA: Diagnosis not present

## 2023-10-09 DIAGNOSIS — G309 Alzheimer's disease, unspecified: Secondary | ICD-10-CM | POA: Diagnosis not present

## 2023-10-09 DIAGNOSIS — R269 Unspecified abnormalities of gait and mobility: Secondary | ICD-10-CM | POA: Diagnosis not present

## 2023-10-09 DIAGNOSIS — R2689 Other abnormalities of gait and mobility: Secondary | ICD-10-CM | POA: Diagnosis not present

## 2023-10-09 DIAGNOSIS — M6281 Muscle weakness (generalized): Secondary | ICD-10-CM | POA: Diagnosis not present

## 2023-10-10 DIAGNOSIS — R269 Unspecified abnormalities of gait and mobility: Secondary | ICD-10-CM | POA: Diagnosis not present

## 2023-10-10 DIAGNOSIS — R2689 Other abnormalities of gait and mobility: Secondary | ICD-10-CM | POA: Diagnosis not present

## 2023-10-10 DIAGNOSIS — G309 Alzheimer's disease, unspecified: Secondary | ICD-10-CM | POA: Diagnosis not present

## 2023-10-10 DIAGNOSIS — R296 Repeated falls: Secondary | ICD-10-CM | POA: Diagnosis not present

## 2023-10-10 DIAGNOSIS — M6281 Muscle weakness (generalized): Secondary | ICD-10-CM | POA: Diagnosis not present

## 2023-10-11 DIAGNOSIS — G309 Alzheimer's disease, unspecified: Secondary | ICD-10-CM | POA: Diagnosis not present

## 2023-10-11 DIAGNOSIS — M6281 Muscle weakness (generalized): Secondary | ICD-10-CM | POA: Diagnosis not present

## 2023-10-11 DIAGNOSIS — R296 Repeated falls: Secondary | ICD-10-CM | POA: Diagnosis not present

## 2023-10-11 DIAGNOSIS — R2689 Other abnormalities of gait and mobility: Secondary | ICD-10-CM | POA: Diagnosis not present

## 2023-10-11 DIAGNOSIS — R269 Unspecified abnormalities of gait and mobility: Secondary | ICD-10-CM | POA: Diagnosis not present

## 2023-10-12 DIAGNOSIS — G309 Alzheimer's disease, unspecified: Secondary | ICD-10-CM | POA: Diagnosis not present

## 2023-10-12 DIAGNOSIS — R296 Repeated falls: Secondary | ICD-10-CM | POA: Diagnosis not present

## 2023-10-12 DIAGNOSIS — R2689 Other abnormalities of gait and mobility: Secondary | ICD-10-CM | POA: Diagnosis not present

## 2023-10-12 DIAGNOSIS — M6281 Muscle weakness (generalized): Secondary | ICD-10-CM | POA: Diagnosis not present

## 2023-10-12 DIAGNOSIS — R269 Unspecified abnormalities of gait and mobility: Secondary | ICD-10-CM | POA: Diagnosis not present

## 2023-10-13 ENCOUNTER — Encounter: Payer: Medicare Other | Admitting: Cardiovascular Disease

## 2023-10-13 ENCOUNTER — Ambulatory Visit: Payer: Medicare Other

## 2023-10-13 ENCOUNTER — Encounter: Payer: Self-pay | Admitting: Cardiovascular Disease

## 2023-10-16 DIAGNOSIS — R296 Repeated falls: Secondary | ICD-10-CM | POA: Diagnosis not present

## 2023-10-16 DIAGNOSIS — R2689 Other abnormalities of gait and mobility: Secondary | ICD-10-CM | POA: Diagnosis not present

## 2023-10-16 DIAGNOSIS — R269 Unspecified abnormalities of gait and mobility: Secondary | ICD-10-CM | POA: Diagnosis not present

## 2023-10-16 DIAGNOSIS — M6281 Muscle weakness (generalized): Secondary | ICD-10-CM | POA: Diagnosis not present

## 2023-10-17 DIAGNOSIS — M6281 Muscle weakness (generalized): Secondary | ICD-10-CM | POA: Diagnosis not present

## 2023-10-17 DIAGNOSIS — R296 Repeated falls: Secondary | ICD-10-CM | POA: Diagnosis not present

## 2023-10-17 DIAGNOSIS — R269 Unspecified abnormalities of gait and mobility: Secondary | ICD-10-CM | POA: Diagnosis not present

## 2023-10-17 DIAGNOSIS — R2689 Other abnormalities of gait and mobility: Secondary | ICD-10-CM | POA: Diagnosis not present

## 2023-10-18 DIAGNOSIS — M6281 Muscle weakness (generalized): Secondary | ICD-10-CM | POA: Diagnosis not present

## 2023-10-18 DIAGNOSIS — R296 Repeated falls: Secondary | ICD-10-CM | POA: Diagnosis not present

## 2023-10-18 DIAGNOSIS — R2689 Other abnormalities of gait and mobility: Secondary | ICD-10-CM | POA: Diagnosis not present

## 2023-10-18 DIAGNOSIS — R269 Unspecified abnormalities of gait and mobility: Secondary | ICD-10-CM | POA: Diagnosis not present

## 2023-10-19 DIAGNOSIS — M6281 Muscle weakness (generalized): Secondary | ICD-10-CM | POA: Diagnosis not present

## 2023-10-19 DIAGNOSIS — G309 Alzheimer's disease, unspecified: Secondary | ICD-10-CM | POA: Diagnosis not present

## 2023-10-19 DIAGNOSIS — R269 Unspecified abnormalities of gait and mobility: Secondary | ICD-10-CM | POA: Diagnosis not present

## 2023-10-19 DIAGNOSIS — R2689 Other abnormalities of gait and mobility: Secondary | ICD-10-CM | POA: Diagnosis not present

## 2023-10-19 DIAGNOSIS — R296 Repeated falls: Secondary | ICD-10-CM | POA: Diagnosis not present

## 2023-10-20 DIAGNOSIS — R2689 Other abnormalities of gait and mobility: Secondary | ICD-10-CM | POA: Diagnosis not present

## 2023-10-20 DIAGNOSIS — M6281 Muscle weakness (generalized): Secondary | ICD-10-CM | POA: Diagnosis not present

## 2023-10-20 DIAGNOSIS — G309 Alzheimer's disease, unspecified: Secondary | ICD-10-CM | POA: Diagnosis not present

## 2023-10-20 DIAGNOSIS — R296 Repeated falls: Secondary | ICD-10-CM | POA: Diagnosis not present

## 2023-10-20 DIAGNOSIS — R269 Unspecified abnormalities of gait and mobility: Secondary | ICD-10-CM | POA: Diagnosis not present

## 2023-10-23 DIAGNOSIS — M6281 Muscle weakness (generalized): Secondary | ICD-10-CM | POA: Diagnosis not present

## 2023-10-23 DIAGNOSIS — R2689 Other abnormalities of gait and mobility: Secondary | ICD-10-CM | POA: Diagnosis not present

## 2023-10-23 DIAGNOSIS — G309 Alzheimer's disease, unspecified: Secondary | ICD-10-CM | POA: Diagnosis not present

## 2023-10-23 DIAGNOSIS — R269 Unspecified abnormalities of gait and mobility: Secondary | ICD-10-CM | POA: Diagnosis not present

## 2023-10-23 DIAGNOSIS — R296 Repeated falls: Secondary | ICD-10-CM | POA: Diagnosis not present

## 2023-10-24 DIAGNOSIS — R296 Repeated falls: Secondary | ICD-10-CM | POA: Diagnosis not present

## 2023-10-24 DIAGNOSIS — R2689 Other abnormalities of gait and mobility: Secondary | ICD-10-CM | POA: Diagnosis not present

## 2023-10-24 DIAGNOSIS — M6281 Muscle weakness (generalized): Secondary | ICD-10-CM | POA: Diagnosis not present

## 2023-10-24 DIAGNOSIS — G309 Alzheimer's disease, unspecified: Secondary | ICD-10-CM | POA: Diagnosis not present

## 2023-10-24 DIAGNOSIS — R269 Unspecified abnormalities of gait and mobility: Secondary | ICD-10-CM | POA: Diagnosis not present

## 2023-10-25 DIAGNOSIS — R269 Unspecified abnormalities of gait and mobility: Secondary | ICD-10-CM | POA: Diagnosis not present

## 2023-10-25 DIAGNOSIS — R296 Repeated falls: Secondary | ICD-10-CM | POA: Diagnosis not present

## 2023-10-25 DIAGNOSIS — M6281 Muscle weakness (generalized): Secondary | ICD-10-CM | POA: Diagnosis not present

## 2023-10-25 DIAGNOSIS — R2689 Other abnormalities of gait and mobility: Secondary | ICD-10-CM | POA: Diagnosis not present

## 2023-10-25 DIAGNOSIS — G309 Alzheimer's disease, unspecified: Secondary | ICD-10-CM | POA: Diagnosis not present

## 2023-10-26 ENCOUNTER — Ambulatory Visit: Payer: Medicare Other

## 2023-10-26 DIAGNOSIS — R2689 Other abnormalities of gait and mobility: Secondary | ICD-10-CM | POA: Diagnosis not present

## 2023-10-26 DIAGNOSIS — R296 Repeated falls: Secondary | ICD-10-CM | POA: Diagnosis not present

## 2023-10-26 DIAGNOSIS — R269 Unspecified abnormalities of gait and mobility: Secondary | ICD-10-CM | POA: Diagnosis not present

## 2023-10-26 DIAGNOSIS — M6281 Muscle weakness (generalized): Secondary | ICD-10-CM | POA: Diagnosis not present

## 2023-10-27 DIAGNOSIS — R2689 Other abnormalities of gait and mobility: Secondary | ICD-10-CM | POA: Diagnosis not present

## 2023-10-27 DIAGNOSIS — M6281 Muscle weakness (generalized): Secondary | ICD-10-CM | POA: Diagnosis not present

## 2023-10-27 DIAGNOSIS — R296 Repeated falls: Secondary | ICD-10-CM | POA: Diagnosis not present

## 2023-10-27 DIAGNOSIS — R269 Unspecified abnormalities of gait and mobility: Secondary | ICD-10-CM | POA: Diagnosis not present

## 2023-10-27 DIAGNOSIS — G309 Alzheimer's disease, unspecified: Secondary | ICD-10-CM | POA: Diagnosis not present

## 2023-10-30 DIAGNOSIS — R2689 Other abnormalities of gait and mobility: Secondary | ICD-10-CM | POA: Diagnosis not present

## 2023-10-30 DIAGNOSIS — M6281 Muscle weakness (generalized): Secondary | ICD-10-CM | POA: Diagnosis not present

## 2023-10-30 DIAGNOSIS — R296 Repeated falls: Secondary | ICD-10-CM | POA: Diagnosis not present

## 2023-10-30 DIAGNOSIS — R269 Unspecified abnormalities of gait and mobility: Secondary | ICD-10-CM | POA: Diagnosis not present

## 2023-10-31 DIAGNOSIS — Z79899 Other long term (current) drug therapy: Secondary | ICD-10-CM | POA: Diagnosis not present

## 2023-10-31 DIAGNOSIS — C44222 Squamous cell carcinoma of skin of right ear and external auricular canal: Secondary | ICD-10-CM | POA: Diagnosis not present

## 2023-10-31 DIAGNOSIS — I5042 Chronic combined systolic (congestive) and diastolic (congestive) heart failure: Secondary | ICD-10-CM | POA: Diagnosis not present

## 2023-10-31 DIAGNOSIS — I2510 Atherosclerotic heart disease of native coronary artery without angina pectoris: Secondary | ICD-10-CM | POA: Diagnosis not present

## 2023-10-31 DIAGNOSIS — I4891 Unspecified atrial fibrillation: Secondary | ICD-10-CM | POA: Diagnosis not present

## 2023-10-31 DIAGNOSIS — G309 Alzheimer's disease, unspecified: Secondary | ICD-10-CM | POA: Diagnosis not present

## 2023-11-06 DIAGNOSIS — H524 Presbyopia: Secondary | ICD-10-CM | POA: Diagnosis not present

## 2023-11-06 DIAGNOSIS — E119 Type 2 diabetes mellitus without complications: Secondary | ICD-10-CM | POA: Diagnosis not present

## 2023-11-06 DIAGNOSIS — Z961 Presence of intraocular lens: Secondary | ICD-10-CM | POA: Diagnosis not present

## 2023-11-06 DIAGNOSIS — H401134 Primary open-angle glaucoma, bilateral, indeterminate stage: Secondary | ICD-10-CM | POA: Diagnosis not present

## 2023-11-20 DIAGNOSIS — M2042 Other hammer toe(s) (acquired), left foot: Secondary | ICD-10-CM | POA: Diagnosis not present

## 2023-11-20 DIAGNOSIS — I739 Peripheral vascular disease, unspecified: Secondary | ICD-10-CM | POA: Diagnosis not present

## 2023-11-20 DIAGNOSIS — I10 Essential (primary) hypertension: Secondary | ICD-10-CM | POA: Diagnosis not present

## 2023-11-20 DIAGNOSIS — Z79899 Other long term (current) drug therapy: Secondary | ICD-10-CM | POA: Diagnosis not present

## 2023-11-20 DIAGNOSIS — M1A00X0 Idiopathic chronic gout, unspecified site, without tophus (tophi): Secondary | ICD-10-CM | POA: Diagnosis not present

## 2023-11-20 DIAGNOSIS — M2041 Other hammer toe(s) (acquired), right foot: Secondary | ICD-10-CM | POA: Diagnosis not present

## 2023-11-20 DIAGNOSIS — E119 Type 2 diabetes mellitus without complications: Secondary | ICD-10-CM | POA: Diagnosis not present

## 2023-11-27 ENCOUNTER — Ambulatory Visit (INDEPENDENT_AMBULATORY_CARE_PROVIDER_SITE_OTHER)

## 2023-11-27 DIAGNOSIS — I442 Atrioventricular block, complete: Secondary | ICD-10-CM

## 2023-11-29 LAB — CUP PACEART REMOTE DEVICE CHECK
Battery Remaining Longevity: 25 mo
Battery Remaining Percentage: 18 %
Battery Voltage: 2.92 V
Brady Statistic RV Percent Paced: 99 %
Date Time Interrogation Session: 20250915150057
Lead Channel Impedance Value: 560 Ohm
Lead Channel Pacing Threshold Amplitude: 0.75 V
Lead Channel Pacing Threshold Pulse Width: 0.4 ms
Lead Channel Sensing Intrinsic Amplitude: 12 mV
Lead Channel Setting Pacing Amplitude: 1 V
Lead Channel Setting Pacing Pulse Width: 0.4 ms
Lead Channel Setting Sensing Sensitivity: 6 mV
Pulse Gen Model: 2240
Pulse Gen Serial Number: 7488472

## 2023-12-02 ENCOUNTER — Ambulatory Visit: Payer: Self-pay | Admitting: Cardiovascular Disease

## 2023-12-04 NOTE — Progress Notes (Signed)
 Remote PPM Transmission

## 2023-12-06 DIAGNOSIS — M6281 Muscle weakness (generalized): Secondary | ICD-10-CM | POA: Diagnosis not present

## 2023-12-06 DIAGNOSIS — G309 Alzheimer's disease, unspecified: Secondary | ICD-10-CM | POA: Diagnosis not present

## 2023-12-06 DIAGNOSIS — R2689 Other abnormalities of gait and mobility: Secondary | ICD-10-CM | POA: Diagnosis not present

## 2023-12-06 DIAGNOSIS — R269 Unspecified abnormalities of gait and mobility: Secondary | ICD-10-CM | POA: Diagnosis not present

## 2023-12-06 DIAGNOSIS — R0989 Other specified symptoms and signs involving the circulatory and respiratory systems: Secondary | ICD-10-CM | POA: Diagnosis not present

## 2023-12-06 DIAGNOSIS — R296 Repeated falls: Secondary | ICD-10-CM | POA: Diagnosis not present

## 2023-12-07 DIAGNOSIS — R269 Unspecified abnormalities of gait and mobility: Secondary | ICD-10-CM | POA: Diagnosis not present

## 2023-12-07 DIAGNOSIS — I5020 Unspecified systolic (congestive) heart failure: Secondary | ICD-10-CM | POA: Diagnosis not present

## 2023-12-07 DIAGNOSIS — M6281 Muscle weakness (generalized): Secondary | ICD-10-CM | POA: Diagnosis not present

## 2023-12-07 DIAGNOSIS — R2689 Other abnormalities of gait and mobility: Secondary | ICD-10-CM | POA: Diagnosis not present

## 2023-12-07 DIAGNOSIS — Z79899 Other long term (current) drug therapy: Secondary | ICD-10-CM | POA: Diagnosis not present

## 2023-12-07 DIAGNOSIS — D649 Anemia, unspecified: Secondary | ICD-10-CM | POA: Diagnosis not present

## 2023-12-07 DIAGNOSIS — G309 Alzheimer's disease, unspecified: Secondary | ICD-10-CM | POA: Diagnosis not present

## 2023-12-07 DIAGNOSIS — R296 Repeated falls: Secondary | ICD-10-CM | POA: Diagnosis not present

## 2023-12-07 DIAGNOSIS — R600 Localized edema: Secondary | ICD-10-CM | POA: Diagnosis not present

## 2023-12-07 DIAGNOSIS — I110 Hypertensive heart disease with heart failure: Secondary | ICD-10-CM | POA: Diagnosis not present

## 2023-12-08 DIAGNOSIS — R296 Repeated falls: Secondary | ICD-10-CM | POA: Diagnosis not present

## 2023-12-08 DIAGNOSIS — R269 Unspecified abnormalities of gait and mobility: Secondary | ICD-10-CM | POA: Diagnosis not present

## 2023-12-08 DIAGNOSIS — G309 Alzheimer's disease, unspecified: Secondary | ICD-10-CM | POA: Diagnosis not present

## 2023-12-08 DIAGNOSIS — M6281 Muscle weakness (generalized): Secondary | ICD-10-CM | POA: Diagnosis not present

## 2023-12-08 DIAGNOSIS — R2689 Other abnormalities of gait and mobility: Secondary | ICD-10-CM | POA: Diagnosis not present

## 2023-12-11 DIAGNOSIS — R269 Unspecified abnormalities of gait and mobility: Secondary | ICD-10-CM | POA: Diagnosis not present

## 2023-12-11 DIAGNOSIS — G309 Alzheimer's disease, unspecified: Secondary | ICD-10-CM | POA: Diagnosis not present

## 2023-12-11 DIAGNOSIS — R600 Localized edema: Secondary | ICD-10-CM | POA: Diagnosis not present

## 2023-12-11 DIAGNOSIS — R2689 Other abnormalities of gait and mobility: Secondary | ICD-10-CM | POA: Diagnosis not present

## 2023-12-11 DIAGNOSIS — I5042 Chronic combined systolic (congestive) and diastolic (congestive) heart failure: Secondary | ICD-10-CM | POA: Diagnosis not present

## 2023-12-11 DIAGNOSIS — R296 Repeated falls: Secondary | ICD-10-CM | POA: Diagnosis not present

## 2023-12-11 DIAGNOSIS — M6281 Muscle weakness (generalized): Secondary | ICD-10-CM | POA: Diagnosis not present

## 2023-12-11 DIAGNOSIS — Z79899 Other long term (current) drug therapy: Secondary | ICD-10-CM | POA: Diagnosis not present

## 2023-12-12 DIAGNOSIS — R296 Repeated falls: Secondary | ICD-10-CM | POA: Diagnosis not present

## 2023-12-12 DIAGNOSIS — Z79899 Other long term (current) drug therapy: Secondary | ICD-10-CM | POA: Diagnosis not present

## 2023-12-12 DIAGNOSIS — M6281 Muscle weakness (generalized): Secondary | ICD-10-CM | POA: Diagnosis not present

## 2023-12-12 DIAGNOSIS — D696 Thrombocytopenia, unspecified: Secondary | ICD-10-CM | POA: Diagnosis not present

## 2023-12-12 DIAGNOSIS — G309 Alzheimer's disease, unspecified: Secondary | ICD-10-CM | POA: Diagnosis not present

## 2023-12-12 DIAGNOSIS — R2689 Other abnormalities of gait and mobility: Secondary | ICD-10-CM | POA: Diagnosis not present

## 2023-12-12 DIAGNOSIS — R269 Unspecified abnormalities of gait and mobility: Secondary | ICD-10-CM | POA: Diagnosis not present

## 2023-12-12 DIAGNOSIS — E860 Dehydration: Secondary | ICD-10-CM | POA: Diagnosis not present

## 2023-12-13 ENCOUNTER — Encounter (HOSPITAL_COMMUNITY): Payer: Self-pay

## 2023-12-13 ENCOUNTER — Emergency Department (HOSPITAL_COMMUNITY)

## 2023-12-13 ENCOUNTER — Other Ambulatory Visit: Payer: Self-pay

## 2023-12-13 ENCOUNTER — Emergency Department (HOSPITAL_COMMUNITY)
Admission: EM | Admit: 2023-12-13 | Discharge: 2023-12-14 | Disposition: A | Discharge status: Skilled Nursing Facility | Source: Skilled Nursing Facility | Attending: Emergency Medicine | Admitting: Emergency Medicine

## 2023-12-13 DIAGNOSIS — I2510 Atherosclerotic heart disease of native coronary artery without angina pectoris: Secondary | ICD-10-CM | POA: Insufficient documentation

## 2023-12-13 DIAGNOSIS — Z79899 Other long term (current) drug therapy: Secondary | ICD-10-CM | POA: Diagnosis not present

## 2023-12-13 DIAGNOSIS — R079 Chest pain, unspecified: Secondary | ICD-10-CM | POA: Diagnosis not present

## 2023-12-13 DIAGNOSIS — Z7984 Long term (current) use of oral hypoglycemic drugs: Secondary | ICD-10-CM | POA: Insufficient documentation

## 2023-12-13 DIAGNOSIS — M542 Cervicalgia: Secondary | ICD-10-CM | POA: Insufficient documentation

## 2023-12-13 DIAGNOSIS — S199XXA Unspecified injury of neck, initial encounter: Secondary | ICD-10-CM | POA: Diagnosis not present

## 2023-12-13 DIAGNOSIS — Z7901 Long term (current) use of anticoagulants: Secondary | ICD-10-CM | POA: Insufficient documentation

## 2023-12-13 DIAGNOSIS — E119 Type 2 diabetes mellitus without complications: Secondary | ICD-10-CM | POA: Diagnosis not present

## 2023-12-13 DIAGNOSIS — N390 Urinary tract infection, site not specified: Secondary | ICD-10-CM | POA: Diagnosis not present

## 2023-12-13 DIAGNOSIS — Z950 Presence of cardiac pacemaker: Secondary | ICD-10-CM | POA: Diagnosis not present

## 2023-12-13 DIAGNOSIS — S0990XA Unspecified injury of head, initial encounter: Secondary | ICD-10-CM | POA: Diagnosis not present

## 2023-12-13 DIAGNOSIS — J189 Pneumonia, unspecified organism: Secondary | ICD-10-CM | POA: Diagnosis not present

## 2023-12-13 DIAGNOSIS — I10 Essential (primary) hypertension: Secondary | ICD-10-CM | POA: Insufficient documentation

## 2023-12-13 DIAGNOSIS — F0390 Unspecified dementia without behavioral disturbance: Secondary | ICD-10-CM | POA: Diagnosis not present

## 2023-12-13 DIAGNOSIS — I4891 Unspecified atrial fibrillation: Secondary | ICD-10-CM | POA: Diagnosis not present

## 2023-12-13 DIAGNOSIS — I517 Cardiomegaly: Secondary | ICD-10-CM | POA: Diagnosis not present

## 2023-12-13 LAB — CBC
HCT: 31.9 % — ABNORMAL LOW (ref 39.0–52.0)
Hemoglobin: 10.8 g/dL — ABNORMAL LOW (ref 13.0–17.0)
MCH: 36 pg — ABNORMAL HIGH (ref 26.0–34.0)
MCHC: 33.9 g/dL (ref 30.0–36.0)
MCV: 106.3 fL — ABNORMAL HIGH (ref 80.0–100.0)
Platelets: 72 K/uL — ABNORMAL LOW (ref 150–400)
RBC: 3 MIL/uL — ABNORMAL LOW (ref 4.22–5.81)
RDW: 12.6 % (ref 11.5–15.5)
WBC: 4.4 K/uL (ref 4.0–10.5)
nRBC: 0 % (ref 0.0–0.2)

## 2023-12-13 LAB — URINALYSIS, ROUTINE W REFLEX MICROSCOPIC
Bacteria, UA: NONE SEEN
Bilirubin Urine: NEGATIVE
Glucose, UA: >=500 mg/dL — AB
Hgb urine dipstick: NEGATIVE
Ketones, ur: NEGATIVE mg/dL
Leukocytes,Ua: NEGATIVE
Nitrite: NEGATIVE
Protein, ur: 100 mg/dL — AB
Specific Gravity, Urine: 1.025 (ref 1.005–1.030)
pH: 5 (ref 5.0–8.0)

## 2023-12-13 LAB — BASIC METABOLIC PANEL WITH GFR
Anion gap: 12 (ref 5–15)
BUN: 33 mg/dL — ABNORMAL HIGH (ref 8–23)
CO2: 24 mmol/L (ref 22–32)
Calcium: 8.7 mg/dL — ABNORMAL LOW (ref 8.9–10.3)
Chloride: 102 mmol/L (ref 98–111)
Creatinine, Ser: 1.43 mg/dL — ABNORMAL HIGH (ref 0.61–1.24)
GFR, Estimated: 50 mL/min — ABNORMAL LOW (ref >60)
Glucose, Bld: 343 mg/dL — ABNORMAL HIGH (ref 70–99)
Potassium: 4.5 mmol/L (ref 3.5–5.1)
Sodium: 137 mmol/L (ref 135–145)

## 2023-12-13 LAB — TROPONIN T, HIGH SENSITIVITY
Troponin T High Sensitivity: 25 ng/L — ABNORMAL HIGH (ref 0–19)
Troponin T High Sensitivity: 25 ng/L — ABNORMAL HIGH (ref 0–19)

## 2023-12-13 MED ORDER — DOXYCYCLINE HYCLATE 100 MG PO CAPS: 100.0000 mg | ORAL_CAPSULE | Freq: Two times a day (BID) | ORAL | 0 refills | Status: DC

## 2023-12-13 MED ORDER — ACETAMINOPHEN 650 MG RE SUPP
650.0000 mg | Freq: Once | RECTAL | Status: AC
  Administered 2023-12-13: 650 mg via RECTAL
  Filled 2023-12-13: qty 1

## 2023-12-13 MED ORDER — DOXYCYCLINE HYCLATE 100 MG PO TABS
100.0000 mg | ORAL_TABLET | Freq: Once | ORAL | Status: AC
  Administered 2023-12-13: 100 mg via ORAL
  Filled 2023-12-13: qty 1

## 2023-12-13 MED ORDER — ACETAMINOPHEN 325 MG PO TABS: 650.0000 mg | ORAL_TABLET | Freq: Once | ORAL | Status: DC

## 2023-12-13 NOTE — ED Notes (Addendum)
 Provider notifed. EKG completed and stated STEMI
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
## 2023-12-13 NOTE — ED Notes (Signed)
 Pt sitting on edge of bed, pt pulled of urine bag, pt pulled out IV, dressing placed, pt helped back into bed.

## 2023-12-13 NOTE — ED Provider Notes (Signed)
 [place] AT [facility] Provider Note   CSN: [id number] Arrival date & time: 12/13/23  1517     Patient presents with: Neck Pain   Jay Campbell is a 78 y.o. male.   Pt is a 78 yo male with pmhx significant for CAD, afib (on Eliquis), DM, anxiety, HTN, GERD, HLD, multiple myeloma, and dementia.  Pt was sent here from the SNF due to possible neck pain.  It is unclear why it's thought he has neck pain as he's not able to really give any hx.  It is unclear if he had a fall as he does have a bruise to his right upper arm.         Prior to Admission medications   Medication Sig Start Date End Date Taking? Authorizing Provider  doxycycline (VIBRAMYCIN) 100 MG capsule Take 1 capsule (100 mg total) by mouth 2 (two) times daily. 12/13/23  Yes Dean Clarity, MD  allopurinol  (ZYLOPRIM ) 100 MG tablet Take 100 mg by mouth Daily at 5am. 12/15/11   [provider]  BD INSULIN  SYRINGE U/F 31G X 5/16 0.3 ML MISC  06/26/19   [provider]  cholecalciferol (VITAMIN D3) 25 MCG (1000 UNIT) tablet Take 2,000 Units by mouth daily.    [provider]  colchicine  0.6 MG tablet Take 0.6 mg by mouth as needed.    [provider]  Continuous Blood Gluc Sensor (FREESTYLE LIBRE 14 DAY SENSOR) MISC  06/02/21   [provider]  diphenhydrAMINE-APAP, sleep, (TYLENOL  PM EXTRA STRENGTH) 50-1000 MG/30ML LIQD Take 1,000 mLs by mouth daily.    [provider]  donepezil  (ARICEPT ) 10 MG tablet Take 1 tablet (10 mg total) by mouth at bedtime. 01/13/22   Whitfield Raisin, NP  ELIQUIS 2.5 MG TABS tablet Take 2.5 mg by mouth 2 (two) times daily. 01/13/22   [provider]  famotidine (PEPCID) 20 MG tablet Take 20 mg by mouth 2 (two) times daily. 07/05/19   [provider]  ferrous sulfate 325 (65 FE) MG EC tablet Take 325 mg by mouth daily with breakfast.    [provider]  glipiZIDE (GLUCOTROL) 5 MG tablet Take 5  mg by mouth daily. 01/13/22   [provider]  HUMULIN 70/30 (70-30) 100 UNIT/ML injection Inject into the skin. 05/25/21   [provider]  HYDROcodone -acetaminophen  (NORCO/VICODIN) 5-325 MG tablet Take 0.5 tablets by mouth daily. 10/10/22   [provider]  hydrOXYzine (ATARAX) 10 MG tablet Take 10 mg by mouth 3 (three) times daily as needed for nausea.    [provider]  isosorbide  mononitrate (IMDUR ) 60 MG 24 hr tablet Take 1 tablet (60 mg total) by mouth daily. 04/17/20   Allred, Lynwood, MD  latanoprost (XALATAN) 0.005 % ophthalmic solution  02/23/21   [provider]  lidocaine  (LIDODERM ) 5 % Place 1 patch onto the skin daily. Remove & Discard patch within 12 hours or as directed by MD 02/07/23   Henderly, Britni A, PA-C  lisinopril (ZESTRIL) 10 MG tablet Take 10 mg by mouth every evening. 12/10/18   [provider]  LORazepam (ATIVAN) 0.5 MG tablet Take 0.25 mg by mouth every 6 (six) hours as needed. 11/23/22   [provider]  LUMIGAN 0.01 % SOLN Place 1 drop into both eyes. 09/16/22   [provider]  meclizine (ANTIVERT) 25 MG tablet Take 25 mg by mouth 3 (three) times daily as needed for dizziness.    [provider]  memantine  (NAMENDA ) 10 MG tablet TAKE 1 TABLET BY MOUTH TWICE  DAILY 11/02/21   Whitfield Raisin, NP  Menthol, Topical Analgesic, (BIOFREEZE) 4 % GEL Apply topically as needed.    [provider]  metFORMIN (GLUCOPHAGE) 1000 MG tablet Take 1,000 mg by mouth 2 (two) times daily with a meal.    [provider]  metoprolol  succinate (TOPROL -XL) 25 MG 24 hr tablet TAKE 1 TABLET BY MOUTH DAILY 02/14/23   Mealor, Augustus E, MD  nitroGLYCERIN  (NITROSTAT ) 0.4 MG SL tablet Place 1 tablet (0.4 mg total) under the tongue every 5 (five) minutes x 3 doses as needed. 04/15/16   Kelsie Agent, MD  [facility] VERIO test strip  08/17/17   [provider]  oxyCODONE -acetaminophen  (PERCOCET/ROXICET) 5-325  MG tablet Take 1 tablet by mouth every 6 (six) hours as needed for severe pain (pain score 7-10). 02/07/23   Henderly, Britni A, PA-C  sertraline  (ZOLOFT ) 100 MG tablet Take 125 mg by mouth daily. 04/19/19   [provider]  simvastatin  (ZOCOR ) 40 MG tablet Take 40 mg by mouth at bedtime.    [provider]  tamsulosin (FLOMAX) 0.4 MG CAPS capsule Take 0.4 mg by mouth daily. 09/03/20   [provider]    Allergies: Contrast media [iodinated contrast media], Vioxx [rofecoxib], Shellfish allergy, and Sulfonamide derivatives    Review of Systems  Unable to perform ROS: Dementia  All other systems reviewed and are negative.   Updated Vital Signs BP (!) 159/63   Pulse 60   Temp 98.7 F (37.1 C) (Oral)   Resp 16   Ht 5' 11 (1.803 m)   Wt 83 kg   SpO2 95%   BMI 25.52 kg/m   Physical Exam Vitals and nursing note reviewed.  Constitutional:      Appearance: Normal appearance.  HENT:     Head: Normocephalic and atraumatic.     Right Ear: External ear normal.     Left Ear: External ear normal.     Nose: Nose normal.     Mouth/Throat:     Mouth: Mucous membranes are moist.     Pharynx: Oropharynx is clear.  Eyes:     Extraocular Movements: Extraocular movements intact.     Conjunctiva/sclera: Conjunctivae normal.     Pupils: Pupils are equal, round, and reactive to light.  Cardiovascular:     Rate and Rhythm: Normal rate and regular rhythm.     Pulses: Normal pulses.     Heart sounds: Normal heart sounds.  Pulmonary:     Effort: Pulmonary effort is normal.     Breath sounds: Normal breath sounds.  Abdominal:     General: Abdomen is flat. Bowel sounds are normal.     Palpations: Abdomen is soft.  Musculoskeletal:        General: Normal range of motion.     Cervical back: Normal range of motion and neck supple.  Skin:    General: Skin is warm.     Capillary Refill: Capillary refill takes less than 2 seconds.     Comments: Old bruise to RUE   Neurological:     Mental Status: He is alert. Mental status is at baseline. He is disoriented.     (all labs ordered are listed, but only abnormal results are displayed) Labs Reviewed  BASIC METABOLIC PANEL WITH GFR - Abnormal; Notable for the following components:      Result Value   Glucose, Bld 343 (*)    BUN  33 (*)    Creatinine, Ser 1.43 (*)    Calcium 8.7 (*)    GFR, Estimated 50 (*)    All other components within normal limits  CBC - Abnormal; Notable for the following components:   RBC 3.00 (*)    Hemoglobin 10.8 (*)    HCT 31.9 (*)    MCV 106.3 (*)    MCH 36.0 (*)    Platelets 72 (*)    All other components within normal limits  URINALYSIS, ROUTINE W REFLEX MICROSCOPIC - Abnormal; Notable for the following components:   Glucose, UA >=500 (*)    Protein, ur 100 (*)    All other components within normal limits  TROPONIN T, HIGH SENSITIVITY - Abnormal; Notable for the following components:   Troponin T High Sensitivity 25 (*)    All other components within normal limits  TROPONIN T, HIGH SENSITIVITY - Abnormal; Notable for the following components:   Troponin T High Sensitivity 25 (*)    All other components within normal limits  RESP PANEL BY RT-PCR (RSV, FLU A&B, COVID)  [id number]    EKG: EKG Interpretation Date/Time:  Wednesday December 13 2023 15:50:20 EDT Ventricular Rate:  61 PR Interval:    QRS Duration:  167 QT Interval:  470 QTC Calculation: 474 R Axis:   -77  Text Interpretation: Atrial fibrillation Nonspecific IVCD with LAD LVH with secondary repolarization abnormality No significant change since last tracing Confirmed by Dean Clarity [phone]) on 12/13/2023 11:12:27 PM  Radiology: CT Head Wo Contrast Result Date: 12/13/2023 CLINICAL DATA:  Polytrauma, blunt EXAM: CT HEAD WITHOUT CONTRAST CT CERVICAL SPINE WITHOUT CONTRAST TECHNIQUE: Multidetector CT imaging of the head and cervical spine was performed following the standard protocol without  intravenous contrast. Multiplanar CT image reconstructions of the cervical spine were also generated. RADIATION DOSE REDUCTION: This exam was performed according to the departmental dose-optimization program which includes automated exposure control, adjustment of the mA and/or kV according to patient size and/or use of iterative reconstruction technique. COMPARISON:  CT head and C-spine 11/28/2022, CT chest 02/07/2023 FINDINGS: CT HEAD FINDINGS Brain: Cerebral ventricle sizes are concordant with the degree of cerebral volume loss. Patchy and confluent areas of decreased attenuation are noted throughout the deep and periventricular white matter of the cerebral hemispheres bilaterally, compatible with chronic microvascular ischemic disease. No evidence of large-territorial acute infarction. No parenchymal hemorrhage. No mass lesion. No extra-axial collection. No mass effect or midline shift. No hydrocephalus. Basilar cisterns are patent. Vascular: No hyperdense vessel. Skull: No acute fracture or focal lesion. Atherosclerotic calcifications are present within the cavernous internal carotid and vertebral arteries. Sinuses/Orbits: Bilateral ethmoid, bilateral maxillary, left frontal, right sphenoid mild mucosal thickening. Paranasal sinuses and mastoid air cells are clear. Bilateral lens replacement. Otherwise the orbits are unremarkable. Other: None. CT CERVICAL SPINE FINDINGS Alignment: Normal. Skull base and vertebrae: Multilevel mild-to-moderate degenerative changes of the spine. No associated severe osseous neural foraminal or central canal stenosis. No acute fracture. No aggressive appearing focal osseous lesion or focal pathologic process. Soft tissues and spinal canal: No prevertebral fluid or swelling. No visible canal hematoma. Upper chest: Patchy left apical airspace opacity. Fall development of right apical 5 x 7 mm pulmonary nodule. Other: Partially visualized left venous catheter. Atherosclerotic plaque.  IMPRESSION: 1. No acute intracranial abnormality. 2. No acute displaced fracture or traumatic listhesis of the cervical spine. 3. Patchy left apical airspace opacity that may represent infection/inflammation. 4. A 6 mm right apical pulmonary nodule. Non-contrast chest CT at  6-12 months is recommended. If the nodule is stable at time of repeat CT, then future CT at 18-24 months (from today's scan) is considered optional for low-risk patients, but is recommended for high-risk patients. This recommendation follows the consensus statement: Guidelines for Management of Incidental Pulmonary Nodules Detected on CT Images: From the Fleischner Society 2017; Radiology 2017; 284:228-243. Electronically Signed   By: Morgane  Naveau M.D.   On: 12/13/2023 17:13   CT Cervical Spine Wo Contrast Result Date: 12/13/2023 CLINICAL DATA:  Polytrauma, blunt EXAM: CT HEAD WITHOUT CONTRAST CT CERVICAL SPINE WITHOUT CONTRAST TECHNIQUE: Multidetector CT imaging of the head and cervical spine was performed following the standard protocol without intravenous contrast. Multiplanar CT image reconstructions of the cervical spine were also generated. RADIATION DOSE REDUCTION: This exam was performed according to the departmental dose-optimization program which includes automated exposure control, adjustment of the mA and/or kV according to patient size and/or use of iterative reconstruction technique. COMPARISON:  CT head and C-spine 11/28/2022, CT chest 02/07/2023 FINDINGS: CT HEAD FINDINGS Brain: Cerebral ventricle sizes are concordant with the degree of cerebral volume loss. Patchy and confluent areas of decreased attenuation are noted throughout the deep and periventricular white matter of the cerebral hemispheres bilaterally, compatible with chronic microvascular ischemic disease. No evidence of large-territorial acute infarction. No parenchymal hemorrhage. No mass lesion. No extra-axial collection. No mass effect or midline shift. No  hydrocephalus. Basilar cisterns are patent. Vascular: No hyperdense vessel. Skull: No acute fracture or focal lesion. Atherosclerotic calcifications are present within the cavernous internal carotid and vertebral arteries. Sinuses/Orbits: Bilateral ethmoid, bilateral maxillary, left frontal, right sphenoid mild mucosal thickening. Paranasal sinuses and mastoid air cells are clear. Bilateral lens replacement. Otherwise the orbits are unremarkable. Other: None. CT CERVICAL SPINE FINDINGS Alignment: Normal. Skull base and vertebrae: Multilevel mild-to-moderate degenerative changes of the spine. No associated severe osseous neural foraminal or central canal stenosis. No acute fracture. No aggressive appearing focal osseous lesion or focal pathologic process. Soft tissues and spinal canal: No prevertebral fluid or swelling. No visible canal hematoma. Upper chest: Patchy left apical airspace opacity. Fall development of right apical 5 x 7 mm pulmonary nodule. Other: Partially visualized left venous catheter. Atherosclerotic plaque. IMPRESSION: 1. No acute intracranial abnormality. 2. No acute displaced fracture or traumatic listhesis of the cervical spine. 3. Patchy left apical airspace opacity that may represent infection/inflammation. 4. A 6 mm right apical pulmonary nodule. Non-contrast chest CT at 6-12 months is recommended. If the nodule is stable at time of repeat CT, then future CT at 18-24 months (from today's scan) is considered optional for low-risk patients, but is recommended for high-risk patients. This recommendation follows the consensus statement: Guidelines for Management of Incidental Pulmonary Nodules Detected on CT Images: From the Fleischner Society 2017; Radiology 2017; 284:228-243. Electronically Signed   By: Morgane  Naveau M.D.   On: 12/13/2023 17:13   DG Chest Port 1 View Result Date: 12/13/2023 CLINICAL DATA:  Chest pain. EXAM: PORTABLE CHEST 1 VIEW COMPARISON:  November 28, 2022. FINDINGS:  Mild cardiomegaly is noted. Single lead left-sided pacemaker is unchanged. Hypoinflation of the lungs is noted. No acute pulmonary disease is noted. Bony thorax is unremarkable. IMPRESSION: No active disease. Electronically Signed   By: Lynwood Landy Raddle M.D.   On: 12/13/2023 16:21     Procedures   Medications Ordered in the ED  doxycycline (VIBRA-TABS) tablet 100 mg (has no administration in time range)  acetaminophen  (TYLENOL ) suppository 650 mg (650 mg Rectal Given 12/13/23 1722)  Medical Decision Making Amount and/or Complexity of Data Reviewed Labs: ordered. Radiology: ordered.  Risk OTC drugs.   This patient presents to the ED for concern of neck pain, this involves an extensive number of treatment options, and is a complaint that carries with it a high risk of complications and morbidity.  The differential diagnosis includes trauma, arthritis, infection   Co morbidities that complicate the patient evaluation  CAD, afib (on Eliquis), DM, anxiety, HTN, GERD, HLD, multiple myeloma, and dementia   Additional history obtained:  Additional history obtained from epic chart review External records from outside source obtained and reviewed including EMS report   Lab Tests:  I Ordered, and personally interpreted labs.  The pertinent results include:  cbc with hgb 10.8 (stable); bmp with glucose elevated at 343, bun 33 and cr 1.43 (cr 1.05 on 3/6); ua neg; trop flat   Imaging Studies ordered:  I ordered imaging studies including cxr, ct head, ct cervical spine  I independently visualized and interpreted imaging which showed  CXR: No active disease.  CT head/c-spine 1. No acute intracranial abnormality.  2. No acute displaced fracture or traumatic listhesis of the  cervical spine.  3. Patchy left apical airspace opacity that may represent  infection/inflammation.  4. A 6 mm right apical pulmonary nodule. Non-contrast chest CT at  6-12  months is recommended. If the nodule is stable at time of  repeat CT, then future CT at 18-24 months (from today's scan) is  considered optional for low-risk patients, but is recommended for  high-risk patients. This recommendation follows the consensus  statement: Guidelines for Management of Incidental Pulmonary Nodules  Detected on CT Images: From the Fleischner Society 2017; Radiology  2017; 284:228-243.   I agree with the radiologist interpretation   Cardiac Monitoring:  The patient was maintained on a cardiac monitor.  I personally viewed and interpreted the cardiac monitored which showed an underlying rhythm of: afib   Medicines ordered and prescription drug management:  I ordered medication including tylenol   for pain  Reevaluation of the patient after these medicines showed that the patient improved I have reviewed the patients home medicines and have made adjustments as needed   Test Considered:  ct   Problem List / ED Course:  Neck pain:  pt does not complain of neck pain now, but apparently did at the SNF.  CT does not show anything acute.   Reevaluation:  After the interventions noted above, I reevaluated the patient and found that they have :improved   Social Determinants of Health:  Lives at [facility]   Dispostion:  After consideration of the diagnostic results and the patients response to treatment, I feel that the patent would benefit from discharge with outpatient f/u.       Final diagnoses:  Neck pain  Community acquired pneumonia, unspecified laterality    ED Discharge Orders          Ordered    doxycycline (VIBRAMYCIN) 100 MG capsule  2 times daily        12/13/23 2320               Maddyn Lieurance, MD 12/13/23 2320

## 2023-12-13 NOTE — ED Notes (Signed)
 Pt refused covid swab. Hitting ED staff upon trying

## 2023-12-14 DIAGNOSIS — R531 Weakness: Secondary | ICD-10-CM | POA: Diagnosis not present

## 2023-12-14 DIAGNOSIS — Z743 Need for continuous supervision: Secondary | ICD-10-CM | POA: Diagnosis not present

## 2023-12-14 DIAGNOSIS — Z7401 Bed confinement status: Secondary | ICD-10-CM | POA: Diagnosis not present

## 2023-12-14 LAB — RESP PANEL BY RT-PCR (RSV, FLU A&B, COVID)  RVPGX2
Influenza A by PCR: NEGATIVE
Influenza B by PCR: NEGATIVE
Resp Syncytial Virus by PCR: NEGATIVE
SARS Coronavirus 2 by RT PCR: NEGATIVE

## 2023-12-14 NOTE — ED Notes (Signed)
 Pt has DNR yellow paper- given to convo transport

## 2023-12-15 ENCOUNTER — Other Ambulatory Visit: Payer: Self-pay

## 2023-12-15 ENCOUNTER — Emergency Department (HOSPITAL_COMMUNITY)

## 2023-12-15 ENCOUNTER — Emergency Department (HOSPITAL_COMMUNITY)
Admission: EM | Admit: 2023-12-15 | Discharge: 2023-12-16 | Disposition: A | Discharge status: Skilled Nursing Facility | Attending: Emergency Medicine | Admitting: Emergency Medicine

## 2023-12-15 DIAGNOSIS — Z7901 Long term (current) use of anticoagulants: Secondary | ICD-10-CM | POA: Insufficient documentation

## 2023-12-15 DIAGNOSIS — Z8579 Personal history of other malignant neoplasms of lymphoid, hematopoietic and related tissues: Secondary | ICD-10-CM | POA: Diagnosis not present

## 2023-12-15 DIAGNOSIS — M25532 Pain in left wrist: Secondary | ICD-10-CM | POA: Diagnosis not present

## 2023-12-15 DIAGNOSIS — M79602 Pain in left arm: Secondary | ICD-10-CM | POA: Diagnosis not present

## 2023-12-15 DIAGNOSIS — I4821 Permanent atrial fibrillation: Secondary | ICD-10-CM | POA: Insufficient documentation

## 2023-12-15 DIAGNOSIS — I517 Cardiomegaly: Secondary | ICD-10-CM | POA: Insufficient documentation

## 2023-12-15 DIAGNOSIS — G309 Alzheimer's disease, unspecified: Secondary | ICD-10-CM | POA: Diagnosis not present

## 2023-12-15 DIAGNOSIS — M8580 Other specified disorders of bone density and structure, unspecified site: Secondary | ICD-10-CM | POA: Diagnosis not present

## 2023-12-15 DIAGNOSIS — Z794 Long term (current) use of insulin: Secondary | ICD-10-CM | POA: Diagnosis not present

## 2023-12-15 DIAGNOSIS — Z950 Presence of cardiac pacemaker: Secondary | ICD-10-CM | POA: Insufficient documentation

## 2023-12-15 DIAGNOSIS — I119 Hypertensive heart disease without heart failure: Secondary | ICD-10-CM | POA: Insufficient documentation

## 2023-12-15 DIAGNOSIS — I2510 Atherosclerotic heart disease of native coronary artery without angina pectoris: Secondary | ICD-10-CM | POA: Diagnosis not present

## 2023-12-15 DIAGNOSIS — R451 Restlessness and agitation: Secondary | ICD-10-CM | POA: Diagnosis not present

## 2023-12-15 DIAGNOSIS — J811 Chronic pulmonary edema: Secondary | ICD-10-CM | POA: Diagnosis not present

## 2023-12-15 DIAGNOSIS — Z7984 Long term (current) use of oral hypoglycemic drugs: Secondary | ICD-10-CM | POA: Insufficient documentation

## 2023-12-15 DIAGNOSIS — E119 Type 2 diabetes mellitus without complications: Secondary | ICD-10-CM | POA: Insufficient documentation

## 2023-12-15 DIAGNOSIS — Z79899 Other long term (current) drug therapy: Secondary | ICD-10-CM | POA: Diagnosis not present

## 2023-12-15 DIAGNOSIS — F0280 Dementia in other diseases classified elsewhere without behavioral disturbance: Secondary | ICD-10-CM | POA: Insufficient documentation

## 2023-12-15 DIAGNOSIS — F03911 Unspecified dementia, unspecified severity, with agitation: Secondary | ICD-10-CM

## 2023-12-15 DIAGNOSIS — R5383 Other fatigue: Secondary | ICD-10-CM | POA: Diagnosis not present

## 2023-12-15 LAB — CBC WITH DIFFERENTIAL/PLATELET
Abs Immature Granulocytes: 0.01 K/uL (ref 0.00–0.07)
Basophils Absolute: 0 K/uL (ref 0.0–0.1)
Basophils Relative: 0 %
Eosinophils Absolute: 0.1 K/uL (ref 0.0–0.5)
Eosinophils Relative: 2 %
HCT: 26.8 % — ABNORMAL LOW (ref 39.0–52.0)
Hemoglobin: 9.1 g/dL — ABNORMAL LOW (ref 13.0–17.0)
Immature Granulocytes: 0 %
Lymphocytes Relative: 16 %
Lymphs Abs: 0.7 K/uL (ref 0.7–4.0)
MCH: 35.7 pg — ABNORMAL HIGH (ref 26.0–34.0)
MCHC: 34 g/dL (ref 30.0–36.0)
MCV: 105.1 fL — ABNORMAL HIGH (ref 80.0–100.0)
Monocytes Absolute: 0.4 K/uL (ref 0.1–1.0)
Monocytes Relative: 9 %
Neutro Abs: 3.1 K/uL (ref 1.7–7.7)
Neutrophils Relative %: 73 %
Platelets: 90 K/uL — ABNORMAL LOW (ref 150–400)
RBC: 2.55 MIL/uL — ABNORMAL LOW (ref 4.22–5.81)
RDW: 12.4 % (ref 11.5–15.5)
WBC: 4.2 K/uL (ref 4.0–10.5)
nRBC: 0 % (ref 0.0–0.2)

## 2023-12-15 LAB — COMPREHENSIVE METABOLIC PANEL WITH GFR
ALT: 12 U/L (ref 0–44)
AST: 16 U/L (ref 15–41)
Albumin: 2.9 g/dL — ABNORMAL LOW (ref 3.5–5.0)
Alkaline Phosphatase: 151 U/L — ABNORMAL HIGH (ref 38–126)
Anion gap: 10 (ref 5–15)
BUN: 32 mg/dL — ABNORMAL HIGH (ref 8–23)
CO2: 25 mmol/L (ref 22–32)
Calcium: 8.4 mg/dL — ABNORMAL LOW (ref 8.9–10.3)
Chloride: 108 mmol/L (ref 98–111)
Creatinine, Ser: 1.24 mg/dL (ref 0.61–1.24)
GFR, Estimated: 59 mL/min — ABNORMAL LOW (ref >60)
Glucose, Bld: 126 mg/dL — ABNORMAL HIGH (ref 70–99)
Potassium: 3.8 mmol/L (ref 3.5–5.1)
Sodium: 142 mmol/L (ref 135–145)
Total Bilirubin: 0.6 mg/dL (ref 0.0–1.2)
Total Protein: 6.3 g/dL — ABNORMAL LOW (ref 6.5–8.1)

## 2023-12-15 LAB — URINALYSIS, ROUTINE W REFLEX MICROSCOPIC
Bacteria, UA: NONE SEEN
Bilirubin Urine: NEGATIVE
Glucose, UA: NEGATIVE mg/dL
Hgb urine dipstick: NEGATIVE
Ketones, ur: NEGATIVE mg/dL
Leukocytes,Ua: NEGATIVE
Nitrite: NEGATIVE
Protein, ur: 100 mg/dL — AB
Specific Gravity, Urine: 1.027 (ref 1.005–1.030)
pH: 5 (ref 5.0–8.0)

## 2023-12-15 LAB — AMMONIA: Ammonia: 36 umol/L — ABNORMAL HIGH (ref 9–35)

## 2023-12-15 LAB — TROPONIN T, HIGH SENSITIVITY: Troponin T High Sensitivity: 24 ng/L — ABNORMAL HIGH (ref 0–19)

## 2023-12-15 MED ORDER — ZIPRASIDONE MESYLATE 20 MG IM SOLR
20.0000 mg | Freq: Once | INTRAMUSCULAR | Status: AC
  Administered 2023-12-15: 20 mg via INTRAMUSCULAR
  Filled 2023-12-15: qty 20

## 2023-12-15 NOTE — ED Provider Notes (Signed)
 [place] EMERGENCY DEPARTMENT AT [facility] Provider Note  CSN: [id number] Arrival date & time: 12/15/23 1722  Chief Complaint(s) Fatigue  HPI Jay Campbell is a 78 y.o. male with past medical history as below, significant for dementia, atrial fibrillation, CAD, DM, GERD who presents to the ED with complaint of fatigue/malaise  Patient was seen here on 10/1 with complaint of neck pain and other vague symptoms.  He was started on antibiotics for presumed pneumonia.  He had reassuring workup at that time otherwise and was discharged back to nursing facility.  Patient was brought back to the ER today secondary to fatigue, arm pain, recheck of his neck pain.  On my assessment patient is a very poor historian.  He does have pain to his left wrist, denies any type of fall or injury.  He has full range of motion to his left wrist but is painful with palpation.  He denies any neck pain.  He has full range of motion with his neck and denies any injury associated with his neck.  He reports that he feels okay and is ready to go home.    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   Warfarin anticoagulation    Patient Active Problem List   Diagnosis Date Noted   Iron deficiency anemia 01/11/2022   Acute encephalopathy 03/11/2018   Mild renal insufficiency 03/11/2018   Anxiety 03/11/2018   Insulin -requiring or dependent type II diabetes mellitus (HCC) 03/11/2018   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 Authorizing Provider  allopurinol  (ZYLOPRIM ) 100 MG tablet Take 100 mg by mouth Daily at 5am. 12/15/11   [provider]  BD INSULIN  SYRINGE U/F 31G X 5/16 0.3 ML MISC  06/26/19   [provider]  cholecalciferol (VITAMIN D3) 25 MCG (1000 UNIT) tablet Take 2,000 Units by mouth daily.    [provider]  colchicine  0.6 MG tablet Take 0.6 mg by mouth as needed.    [provider]  Continuous Blood Gluc Sensor (FREESTYLE LIBRE 14 DAY SENSOR) MISC  06/02/21   [provider]  diphenhydrAMINE-APAP, sleep, (TYLENOL  PM EXTRA STRENGTH) 50-1000 MG/30ML LIQD Take 1,000 mLs by mouth daily.    [provider]  donepezil  (ARICEPT ) 10 MG tablet Take 1 tablet (10 mg total) by mouth at bedtime. 01/13/22   Whitfield Raisin, NP  doxycycline (VIBRAMYCIN) 100 MG capsule Take 1 capsule (100 mg total) by mouth 2 (two) times daily. 12/13/23   Dean Clarity, MD  ELIQUIS 2.5 MG TABS tablet Take 2.5 mg by mouth 2 (two) times daily. 01/13/22  [provider]  famotidine (PEPCID) 20 MG tablet Take 20 mg by mouth 2 (two) times daily. 07/05/19   [provider]  ferrous sulfate 325 (65 FE) MG EC tablet Take 325 mg by mouth daily with breakfast.    [provider]  glipiZIDE (GLUCOTROL) 5 MG tablet Take 5 mg by mouth daily. 01/13/22   [provider]  HUMULIN 70/30 (70-30) 100 UNIT/ML injection Inject into the skin. 05/25/21   [provider]  HYDROcodone -acetaminophen  (NORCO/VICODIN) 5-325 MG tablet Take 0.5 tablets by mouth daily. 10/10/22   [provider]  hydrOXYzine (ATARAX) 10 MG tablet  Take 10 mg by mouth 3 (three) times daily as needed for nausea.    [provider]  isosorbide  mononitrate (IMDUR ) 60 MG 24 hr tablet Take 1 tablet (60 mg total) by mouth daily. 04/17/20   Allred, Lynwood, MD  latanoprost (XALATAN) 0.005 % ophthalmic solution  02/23/21   [provider]  lidocaine  (LIDODERM ) 5 % Place 1 patch onto the skin daily. Remove & Discard patch within 12 hours or as directed by MD 02/07/23   Henderly, Britni A, PA-C  lisinopril (ZESTRIL) 10 MG tablet Take 10 mg by mouth every evening. 12/10/18   [provider]  LORazepam (ATIVAN) 0.5 MG tablet Take 0.25 mg by mouth every 6 (six) hours as needed. 11/23/22   [provider]  LUMIGAN 0.01 % SOLN Place 1 drop into both eyes. 09/16/22   [provider]  meclizine (ANTIVERT) 25 MG tablet Take 25 mg by mouth 3 (three) times daily as needed for dizziness.    [provider]  memantine  (NAMENDA ) 10 MG tablet TAKE 1 TABLET BY MOUTH TWICE  DAILY 11/02/21   Whitfield Raisin, NP  Menthol, Topical Analgesic, (BIOFREEZE) 4 % GEL Apply topically as needed.    [provider]  metFORMIN (GLUCOPHAGE) 1000 MG tablet Take 1,000 mg by mouth 2 (two) times daily with a meal.    [provider]  metoprolol  succinate (TOPROL -XL) 25 MG 24 hr tablet TAKE 1 TABLET BY MOUTH DAILY 02/14/23   Mealor, Augustus E, MD  nitroGLYCERIN  (NITROSTAT ) 0.4 MG SL tablet Place 1 tablet (0.4 mg total) under the tongue every 5 (five) minutes x 3 doses as needed. 04/15/16   Kelsie Lynwood, MD  [facility] VERIO test strip  08/17/17   [provider]  oxyCODONE -acetaminophen  (PERCOCET/ROXICET) 5-325 MG tablet Take 1 tablet by mouth every 6 (six) hours as needed for severe pain (pain score 7-10). 02/07/23   Henderly, Britni A, PA-C  sertraline  (ZOLOFT ) 100 MG tablet Take 125 mg by mouth daily. 04/19/19   [provider]  simvastatin  (ZOCOR ) 40 MG tablet Take 40 mg by mouth at bedtime.    [provider]  tamsulosin (FLOMAX) 0.4 MG CAPS capsule Take 0.4 mg by mouth daily. 09/03/20   [provider]  Past Surgical History Past Surgical History:  Procedure Laterality Date   CATARACT EXTRACTION Right    COCHLEAR IMPLANT Right    HEMORRHOID BANDING     KNEE ARTHROSCOPY Right 1992   Dr Brenna    PACEMAKER GENERATOR CHANGE Bilateral 02/01/2013   Procedure: PACEMAKER GENERATOR CHANGE;  Surgeon: Lynwood JONETTA Rakers, MD;  Location[facility] C[facility] LAB;  Service: Cardiovascular;  Laterality: Bilateral;   PACEMAKER PLACEMENT  2002; 2014   SJM implanted by Dr Waddell with AV nodal ablation performed; gen change to [employer] SR RF by Dr Rakers 02-01-13   RETINAL LASER PROCEDURE Left    SLT LASER APPLICATION Left 12/14/2015   Procedure: SLT LASER APPLICATION;  Surgeon: Dow JULIANNA Burke, MD;  Location: [facility];  Service: Ophthalmology;  Laterality: Left;   Family History Family History  Problem Relation Age of Onset   COPD Mother    Emphysema Mother    Cancer - Lung Father    Heart disease Paternal Uncle    Stomach cancer Neg Hx    Colon cancer Neg Hx     Social History Social History   Tobacco Use   Smoking status: Never   Smokeless tobacco: Never  Vaping Use   Vaping status: Never Used  Substance Use Topics   Alcohol use: No    Alcohol/week: 0.0 standard drinks of alcohol   Drug use: No   Allergies Contrast media [iodinated contrast media], Vioxx [rofecoxib], Shellfish allergy, and Sulfonamide derivatives  Review of Systems A thorough review of systems was obtained and all systems are negative except as noted in the HPI and PMH.   Physical Exam Vital Signs  I have reviewed the triage vital signs BP (!) 131/116   Pulse (!) 139   Temp 98.2 F (36.8 C) (Axillary)   Resp 14   Ht 5' 11 (1.803 m)   Wt 83 kg   SpO2 93%   BMI 25.52 kg/m   Physical Exam Vitals and nursing note reviewed.  Constitutional:      General: He is not in acute distress.    Appearance: Normal appearance. He is well-developed. He is not ill-appearing.  HENT:     Head: Normocephalic and atraumatic.     Right Ear: External ear normal.     Left Ear: External ear normal.     Nose: Nose normal.     Mouth/Throat:     Mouth: Mucous membranes are dry.  Eyes:     General: No scleral icterus.       Right eye: No discharge.        Left eye: No discharge.  Cardiovascular:     Rate and Rhythm: Normal rate.     Pulses: Normal pulses.  Pulmonary:     Effort: Pulmonary effort is normal. No respiratory distress.     Breath sounds: No stridor.  Abdominal:     General: Abdomen is flat. There is no distension.     Palpations: Abdomen is soft.     Tenderness: There is no guarding.  Musculoskeletal:        General: No deformity.     Cervical back: Full passive range of motion without pain. No rigidity.  Skin:    General: Skin is warm and dry.     Coloration: Skin is not cyanotic, jaundiced or pale.  Neurological:     Mental Status: He is disoriented and confused.  Psychiatric:        Speech: Speech normal.  Behavior: Behavior normal. Behavior is cooperative.     ED Results and Treatments Labs (all labs ordered are listed, but only abnormal results are displayed) Labs Reviewed  CBC WITH DIFFERENTIAL/PLATELET - Abnormal; Notable for the following components:      Result Value   RBC 2.55 (*)    Hemoglobin 9.1 (*)    HCT 26.8 (*)    MCV 105.1 (*)    MCH 35.7 (*)    Platelets 90 (*)    All other components within normal limits  COMPREHENSIVE METABOLIC PANEL WITH GFR - Abnormal; Notable for the following components:   Glucose, Bld 126 (*)    BUN 32 (*)    Calcium 8.4 (*)    Total Protein 6.3 (*)    Albumin 2.9 (*)    Alkaline Phosphatase 151 (*)    GFR, Estimated 59 (*)    All other components within normal limits  URINALYSIS, ROUTINE W  REFLEX MICROSCOPIC - Abnormal; Notable for the following components:   Protein, ur 100 (*)    All other components within normal limits  AMMONIA - Abnormal; Notable for the following components:   Ammonia 36 (*)    All other components within normal limits  TROPONIN T, HIGH SENSITIVITY - Abnormal; Notable for the following components:   Troponin T High Sensitivity 24 (*)    All other components within normal limits  TROPONIN T, HIGH SENSITIVITY - Abnormal; Notable for the following components:   Troponin T High Sensitivity 24 (*)    All other components within normal limits                                                                                                                          Radiology DG Wrist Complete Left Result Date: 12/15/2023 CLINICAL DATA:  pain, left arm pain EXAM: LEFT WRIST - COMPLETE 3+ VIEW COMPARISON:  None Available. FINDINGS: Diffuse osteopenia.No acute fracture or dislocation. There is no evidence of arthropathy or other focal bone abnormality. Extensive peripheral vascular atherosclerosis. IMPRESSION: Diffuse osteopenia. No acute fracture or dislocation. Electronically Signed   By: Rogelia Myers M.D.   On: 12/15/2023 20:48   DG Chest Portable 1 View Result Date: 12/15/2023 EXAM: 1 VIEW XRAY OF THE CHEST 12/15/2023 07:50:00 PM COMPARISON: 12/13/2023 CLINICAL HISTORY: Fatigue, dementia. Patient was brought in from [facility] due to fatigue, right-sided neck pain, and left arm pain. Per facility, the patient is normally ambulatory and more talkative. While triaging, the patient becomes confused at times. Vital signs are stable. The patient has a DNR order. FINDINGS: LUNGS AND PLEURA: Interval development of a trace interstitial pulmonary edema. No pneumothorax. No pleural effusion. HEART AND MEDIASTINUM: Stable left chest single lead pacing system and wire insertions. Persistent moderate to severe cardiomegaly. BONES AND SOFT TISSUES: No acute osseous  abnormality. IMPRESSION: 1. Interval development of trace interstitial pulmonary edema. Son report 2. Persistent moderate to severe cardiomegaly. Electronically signed by: Dorethia Molt MD 12/15/2023 08:07 PM EDT  RP Workstation: [id number]    Pertinent labs & imaging results that were available during my care of the patient were reviewed by me and considered in my medical decision making (see MDM for details).  Medications Ordered in ED Medications  ziprasidone (GEODON) injection 20 mg (20 mg Intramuscular Given 12/15/23 1955)                                                                                                                                     Procedures Procedures  (including critical care time)  Medical Decision Making / ED Course    Medical Decision Making:    Jay Campbell is a 78 y.o. male with past medical history as below, significant for dementia, atrial fibrillation, CAD, DM, GERD who presents to the ED with complaint of fatigue/malaise. The complaint involves an extensive differential diagnosis and also carries with it a high risk of complications and morbidity.  Serious etiology was considered. Ddx includes but is not limited to: Infection, dehydration, metabolic derangement, progressive dementia, MSK injury, fracture, dislocation,  Complete initial physical exam performed, notably the patient was in no acute distress, resting comfortably on stretcher.    Reviewed and confirmed nursing documentation for past medical history, family history, social history.  Vital signs reviewed.    Left wrist pain > - Pain with palpation.  He has full range of motion to his wrist.  Denies any recent injury.  X-ray obtained and is reassuring.  Will apply brace and open follow-up with PCP for repeat x-ray if symptoms persist  Agitation/fatigue > -Patient was recently seen here for pneumonia.  He had reassuring workup and was discharged in stable condition.  He has no hypoxia.   He is HDS.  His x-ray here is reassuring, his labs are reassuring.  Does not appear to be septic.  Labs appear to be very similar to his baseline.  Patient symptoms likely secondary to his ongoing dementia in setting of pneumonia.  He is not septic.  He is tolerant p.o. without difficulty.  Did not see need for him to be admitted at this time.  He is stable for discharge back to his facility.  Patient has no complaints and is eager for discharge    Of note pt awaiting transport back to facility for many hours after discharged, he was noted to have elevated HR on nursing documentation when he left the ER, this was >6 hrs past the time that I left the department and when his disposition was set. Unclear if staff was notified of the change to his HR. He has hx of afib.   Patient in no distress and overall condition is stable. Detailed discussions were had with the patient/guardian regarding current findings, and need for close f/u with PCP or on call doctor. The patient/guardian has been instructed to return immediately if the symptoms worsen in any way for re-evaluation. Patient/guardian verbalized understanding and is  in agreement with current care plan. All questions answered prior to discharge.                Additional history obtained: -Additional history obtained from na -External records from outside source obtained and reviewed including: Chart review including previous notes, labs, imaging, consultation notes including  Recent ER evaluation, prior labs and imaging   Lab Tests: -I ordered, reviewed, and interpreted labs.   The pertinent results include:   Labs Reviewed  CBC WITH DIFFERENTIAL/PLATELET - Abnormal; Notable for the following components:      Result Value   RBC 2.55 (*)    Hemoglobin 9.1 (*)    HCT 26.8 (*)    MCV 105.1 (*)    MCH 35.7 (*)    Platelets 90 (*)    All other components within normal limits  COMPREHENSIVE METABOLIC PANEL WITH GFR - Abnormal;  Notable for the following components:   Glucose, Bld 126 (*)    BUN 32 (*)    Calcium 8.4 (*)    Total Protein 6.3 (*)    Albumin 2.9 (*)    Alkaline Phosphatase 151 (*)    GFR, Estimated 59 (*)    All other components within normal limits  URINALYSIS, ROUTINE W REFLEX MICROSCOPIC - Abnormal; Notable for the following components:   Protein, ur 100 (*)    All other components within normal limits  AMMONIA - Abnormal; Notable for the following components:   Ammonia 36 (*)    All other components within normal limits  TROPONIN T, HIGH SENSITIVITY - Abnormal; Notable for the following components:   Troponin T High Sensitivity 24 (*)    All other components within normal limits  TROPONIN T, HIGH SENSITIVITY - Abnormal; Notable for the following components:   Troponin T High Sensitivity 24 (*)    All other components within normal limits    Notable for labs stable  EKG   EKG Interpretation Date/Time:    Ventricular Rate:    PR Interval:    QRS Duration:    QT Interval:    QTC Calculation:   R Axis:      Text Interpretation:           Imaging Studies ordered: I ordered imaging studies including cxr I independently visualized the following imaging with scope of interpretation limited to determining acute life threatening conditions related to emergency care; findings noted above I agree with the radiologist interpretation If any imaging was obtained with contrast I closely monitored patient for any possible adverse reaction a/w contrast administration in the emergency department   Medicines ordered and prescription drug management: Meds ordered this encounter  Medications   ziprasidone (GEODON) injection 20 mg    -I have reviewed the patients home medicines and have made adjustments as needed   Consultations Obtained: na   Cardiac Monitoring: Continuous pulse oximetry interpreted by myself, 95% on RA.    Social Determinants of Health:  Diagnosis or treatment  significantly limited by social determinants of health: dementia, SNF   Reevaluation: After the interventions noted above, I reevaluated the patient and found that they have improved  Co morbidities that complicate the patient evaluation  Past Medical History:  Diagnosis Date   Alzheimer's dementia (HCC)    Anxiety    Atrial fibrillation (HCC)    Permanent   Coronary artery disease    Mild nonobstructive 1/12   DM (diabetes mellitus) (HCC)    GERD (gastroesophageal reflux disease)    Gout  Hearing disorder, cochlear    Hiatal hernia    HLD (hyperlipidemia)    HTN (hypertension)    S/P AV nodal ablation    s/p SJM PPM; gen change 02-01-13 by Dr Kelsie   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   Warfarin anticoagulation       Dispostion: Disposition decision including need for hospitalization was considered, and patient discharged from emergency department.    Final Clinical Impression(s) / ED Diagnoses Final diagnoses:  Agitation due to dementia [facility])  Left wrist pain  Cardiomegaly        Elnor Jayson LABOR, DO 12/16/23 1806

## 2023-12-15 NOTE — ED Notes (Signed)
 Was able to obtain some blood for labs until pt jerked his right arm up and refused to allow this rn to continue.

## 2023-12-15 NOTE — Discharge Instructions (Addendum)
 It was a pleasure caring for you today in the emergency department.  Please continue antibiotics that were provided for pneumonia at recent visit   Please follow up with PCP for repeat XR of the left wrist if pain persists  Please return to the emergency department for any worsening or worrisome symptoms.

## 2023-12-15 NOTE — ED Notes (Addendum)
 Pt agitated and confused with radiology team. Yelling and swinging arms.

## 2023-12-15 NOTE — ED Triage Notes (Signed)
 Pt bib ems from [facility] from fatigue, R-sided neck pain, and L arm pain. Per facility pt is normally ambulatory and more talkative. While triaging pt becomes confused at times. Vital stable. DNR

## 2023-12-16 DIAGNOSIS — M25532 Pain in left wrist: Secondary | ICD-10-CM | POA: Diagnosis not present

## 2023-12-16 LAB — TROPONIN T, HIGH SENSITIVITY: Troponin T High Sensitivity: 24 ng/L — ABNORMAL HIGH (ref 0–19)

## 2023-12-16 NOTE — ED Notes (Signed)
 Went in to attempt to get pt up and awake to eat lunch. Noticed his diaper needed changing along with bed sheet. Received assistance from tech to help change diaper and bedding. Pt was fighting against us  when turning in both directions and then proceeded to grab this RN's hand and squeezed my knuckles, but finally I was able to break free from the hold. Pt is refusing to eat any of his chicken and dumplings or his green beans at this time. Pt called this RN a dumb bitch who came to bring him destruction so I stepped out of the room for a moment to give him some space.

## 2023-12-18 DIAGNOSIS — M79602 Pain in left arm: Secondary | ICD-10-CM | POA: Diagnosis not present

## 2023-12-19 DIAGNOSIS — J90 Pleural effusion, not elsewhere classified: Secondary | ICD-10-CM | POA: Diagnosis not present

## 2023-12-19 DIAGNOSIS — K7689 Other specified diseases of liver: Secondary | ICD-10-CM | POA: Diagnosis not present

## 2023-12-19 DIAGNOSIS — N200 Calculus of kidney: Secondary | ICD-10-CM | POA: Diagnosis not present

## 2023-12-19 DIAGNOSIS — E8770 Fluid overload, unspecified: Secondary | ICD-10-CM | POA: Diagnosis not present

## 2023-12-19 DIAGNOSIS — R531 Weakness: Secondary | ICD-10-CM | POA: Diagnosis not present

## 2023-12-19 DIAGNOSIS — Z792 Long term (current) use of antibiotics: Secondary | ICD-10-CM | POA: Diagnosis not present

## 2023-12-19 DIAGNOSIS — K8020 Calculus of gallbladder without cholecystitis without obstruction: Secondary | ICD-10-CM | POA: Diagnosis not present

## 2023-12-19 DIAGNOSIS — Z79899 Other long term (current) drug therapy: Secondary | ICD-10-CM | POA: Diagnosis not present

## 2023-12-19 DIAGNOSIS — R161 Splenomegaly, not elsewhere classified: Secondary | ICD-10-CM | POA: Diagnosis not present

## 2023-12-19 DIAGNOSIS — J9811 Atelectasis: Secondary | ICD-10-CM | POA: Diagnosis not present

## 2023-12-19 DIAGNOSIS — I672 Cerebral atherosclerosis: Secondary | ICD-10-CM | POA: Diagnosis not present

## 2023-12-19 DIAGNOSIS — I509 Heart failure, unspecified: Secondary | ICD-10-CM | POA: Diagnosis not present

## 2023-12-19 DIAGNOSIS — R600 Localized edema: Secondary | ICD-10-CM | POA: Diagnosis not present

## 2023-12-20 DIAGNOSIS — E8770 Fluid overload, unspecified: Secondary | ICD-10-CM | POA: Diagnosis not present

## 2023-12-20 DIAGNOSIS — Z79899 Other long term (current) drug therapy: Secondary | ICD-10-CM | POA: Diagnosis not present

## 2023-12-20 DIAGNOSIS — Z792 Long term (current) use of antibiotics: Secondary | ICD-10-CM | POA: Diagnosis not present

## 2023-12-21 DIAGNOSIS — Z79899 Other long term (current) drug therapy: Secondary | ICD-10-CM | POA: Diagnosis not present

## 2023-12-21 DIAGNOSIS — Z792 Long term (current) use of antibiotics: Secondary | ICD-10-CM | POA: Diagnosis not present

## 2023-12-22 DIAGNOSIS — R6889 Other general symptoms and signs: Secondary | ICD-10-CM | POA: Diagnosis not present

## 2023-12-22 DIAGNOSIS — Z743 Need for continuous supervision: Secondary | ICD-10-CM | POA: Diagnosis not present

## 2023-12-22 DIAGNOSIS — R531 Weakness: Secondary | ICD-10-CM | POA: Diagnosis not present

## 2023-12-27 DIAGNOSIS — I10 Essential (primary) hypertension: Secondary | ICD-10-CM | POA: Diagnosis not present

## 2023-12-27 DIAGNOSIS — I5042 Chronic combined systolic (congestive) and diastolic (congestive) heart failure: Secondary | ICD-10-CM | POA: Diagnosis not present

## 2023-12-27 DIAGNOSIS — D649 Anemia, unspecified: Secondary | ICD-10-CM | POA: Diagnosis not present

## 2023-12-27 DIAGNOSIS — K8020 Calculus of gallbladder without cholecystitis without obstruction: Secondary | ICD-10-CM | POA: Diagnosis not present

## 2023-12-27 DIAGNOSIS — G309 Alzheimer's disease, unspecified: Secondary | ICD-10-CM | POA: Diagnosis not present

## 2023-12-27 DIAGNOSIS — N200 Calculus of kidney: Secondary | ICD-10-CM | POA: Diagnosis not present

## 2023-12-27 DIAGNOSIS — I4891 Unspecified atrial fibrillation: Secondary | ICD-10-CM | POA: Diagnosis not present

## 2023-12-27 DIAGNOSIS — R161 Splenomegaly, not elsewhere classified: Secondary | ICD-10-CM | POA: Diagnosis not present

## 2023-12-27 DIAGNOSIS — I110 Hypertensive heart disease with heart failure: Secondary | ICD-10-CM | POA: Diagnosis not present

## 2023-12-27 DIAGNOSIS — E119 Type 2 diabetes mellitus without complications: Secondary | ICD-10-CM | POA: Diagnosis not present

## 2023-12-27 DIAGNOSIS — K7460 Unspecified cirrhosis of liver: Secondary | ICD-10-CM | POA: Diagnosis not present

## 2024-01-03 DIAGNOSIS — N390 Urinary tract infection, site not specified: Secondary | ICD-10-CM | POA: Diagnosis not present

## 2024-01-03 DIAGNOSIS — R0989 Other specified symptoms and signs involving the circulatory and respiratory systems: Secondary | ICD-10-CM | POA: Diagnosis not present

## 2024-01-03 DIAGNOSIS — R059 Cough, unspecified: Secondary | ICD-10-CM | POA: Diagnosis not present

## 2024-01-04 NOTE — Progress Notes (Unsigned)
  Electrophysiology Office Note:   ID:  Jay Campbell, Jay Campbell 1945-03-16, MRN [id number]  Primary Cardiologist: Jayson Sierras, MD Electrophysiologist: Eulas FORBES Furbish, MD  {Click to update primary MD,subspecialty MD or APP then REFRESH:1}    History of Present Illness:   Jay Campbell is a 78 y.o. male with h/o permanent AF and CHB s/p PPM seen today for routine electrophysiology followup.   Since last being seen in our clinic the patient reports doing ***.  he denies chest pain, palpitations, dyspnea, PND, orthopnea, nausea, vomiting, dizziness, syncope, edema, weight gain, or early satiety.   Review of systems complete and found to be negative unless listed in HPI.   EP Information / Studies Reviewed:    EKG is not ordered today. EKG from *** reviewed which showed ***       PPM Interrogation-  reviewed in detail today,  See PACEART report.  Arrhythmia/Device History PPM-[facility]-Merlin   Physical Exam:   VS:  There were no vitals taken for this visit.   Wt Readings from Last 3 Encounters:  12/15/23 182 lb 15.7 oz (83 kg)  12/13/23 182 lb 15.7 oz (83 kg)  11/28/22 179 lb 13.3 oz (81.6 kg)     GEN: No acute distress  NECK: No JVD; No carotid bruits CARDIAC: {EPRHYTHM:28826}, no murmurs, rubs, gallops RESPIRATORY:  Clear to auscultation without rales, wheezing or rhonchi  ABDOMEN: Soft, non-tender, non-distended EXTREMITIES:  {EDEMA LEVEL:28147::No} edema; No deformity   ASSESSMENT AND PLAN:    Uncontrolled atrial arrhyhtmia s/p AV node ablation s/p Abbott PPM  Normal PPM function See Pace Art report No changes today  Permanent AF  {Click here to Review PMH, Prob List, Meds, Allergies, SHx, FHx  :1}   Disposition:   Follow up with {EPPROVIDERS:28135::EP Team} {EPFOLLOW UP:28173}  Signed, Ozell Prentice Passey, PA-C

## 2024-01-05 ENCOUNTER — Ambulatory Visit: Attending: Student | Admitting: Student

## 2024-01-05 ENCOUNTER — Encounter: Payer: Self-pay | Admitting: Student

## 2024-01-05 VITALS — BP 152/75 | HR 61 | Ht 65.0 in | Wt 174.0 lb

## 2024-01-05 DIAGNOSIS — I4821 Permanent atrial fibrillation: Secondary | ICD-10-CM | POA: Diagnosis not present

## 2024-01-05 DIAGNOSIS — I442 Atrioventricular block, complete: Secondary | ICD-10-CM | POA: Diagnosis not present

## 2024-01-05 DIAGNOSIS — I2510 Atherosclerotic heart disease of native coronary artery without angina pectoris: Secondary | ICD-10-CM

## 2024-01-05 LAB — CUP PACEART INCLINIC DEVICE CHECK
Battery Remaining Longevity: 24 mo
Battery Voltage: 2.9 V
Brady Statistic RV Percent Paced: 99.96 %
Date Time Interrogation Session: 20251024110031
Implantable Lead Connection Status: 753985
Implantable Lead Implant Date: 20020215
Implantable Lead Location: 753860
Implantable Pulse Generator Implant Date: 20141121
Lead Channel Impedance Value: 600 Ohm
Lead Channel Pacing Threshold Amplitude: 0.75 V
Lead Channel Pacing Threshold Pulse Width: 0.4 ms
Lead Channel Sensing Intrinsic Amplitude: 12 mV
Lead Channel Setting Pacing Amplitude: 1 V
Lead Channel Setting Pacing Pulse Width: 0.4 ms
Lead Channel Setting Sensing Sensitivity: 6 mV
Pulse Gen Model: 1240
Pulse Gen Serial Number: 7488472

## 2024-01-05 NOTE — Patient Instructions (Signed)
 Medication Instructions:  Your physician recommends that you continue on your current medications as directed. Please refer to the Current Medication list given to you today.  *If you need a refill on your cardiac medications before your next appointment, please call your pharmacy*  Lab Work: None ordered If you have labs (blood work) drawn today and your tests are completely normal, you will receive your results only by: MyChart Message (if you have MyChart) OR A paper copy in the mail If you have any lab test that is abnormal or we need to change your treatment, we will call you to review the results.  Follow-Up: At [facility], you and your health needs are our priority.  As part of our continuing mission to provide you with exceptional heart care, our providers are all part of one team.  This team includes your primary Cardiologist (physician) and Advanced Practice Providers or APPs (Physician Assistants and Nurse Practitioners) who all work together to provide you with the care you need, when you need it.  Your next appointment:   1 year(s)  Provider:   Eulas Furbish, MD

## 2024-01-08 DIAGNOSIS — Z79899 Other long term (current) drug therapy: Secondary | ICD-10-CM | POA: Diagnosis not present

## 2024-01-08 DIAGNOSIS — N179 Acute kidney failure, unspecified: Secondary | ICD-10-CM | POA: Diagnosis not present

## 2024-01-08 DIAGNOSIS — E860 Dehydration: Secondary | ICD-10-CM | POA: Diagnosis not present

## 2024-01-12 ENCOUNTER — Ambulatory Visit: Payer: Medicare Other

## 2024-01-18 ENCOUNTER — Ambulatory Visit: Payer: Self-pay | Admitting: Cardiovascular Disease

## 2024-01-25 ENCOUNTER — Ambulatory Visit: Payer: Medicare Other

## 2024-02-02 ENCOUNTER — Encounter: Payer: Self-pay | Admitting: Oncology

## 2024-02-16 ENCOUNTER — Encounter: Admitting: Cardiovascular Disease

## 2024-02-26 ENCOUNTER — Ambulatory Visit

## 2024-02-28 LAB — CUP PACEART REMOTE DEVICE CHECK
Battery Remaining Longevity: 23 mo
Battery Remaining Percentage: 16 %
Battery Voltage: 2.9 V
Brady Statistic RV Percent Paced: 99 %
Date Time Interrogation Session: 20251215130017
Lead Channel Impedance Value: 600 Ohm
Lead Channel Pacing Threshold Amplitude: 0.875 V
Lead Channel Pacing Threshold Pulse Width: 0.4 ms
Lead Channel Sensing Intrinsic Amplitude: 12 mV
Lead Channel Setting Pacing Amplitude: 1.125
Lead Channel Setting Pacing Pulse Width: 0.4 ms
Lead Channel Setting Sensing Sensitivity: 6 mV
Pulse Gen Model: 2240
Pulse Gen Serial Number: 7488472

## 2024-02-28 NOTE — Progress Notes (Signed)
 Remote PPM Transmission

## 2024-03-03 ENCOUNTER — Emergency Department (HOSPITAL_COMMUNITY)

## 2024-03-03 ENCOUNTER — Encounter (HOSPITAL_COMMUNITY): Payer: Self-pay | Admitting: Emergency Medicine

## 2024-03-03 ENCOUNTER — Other Ambulatory Visit: Payer: Self-pay

## 2024-03-03 ENCOUNTER — Emergency Department (HOSPITAL_COMMUNITY)
Admission: EM | Admit: 2024-03-03 | Discharge: 2024-03-04 | Disposition: A | Discharge status: Skilled Nursing Facility | Source: Skilled Nursing Facility | Attending: Emergency Medicine | Admitting: Emergency Medicine

## 2024-03-03 DIAGNOSIS — Z950 Presence of cardiac pacemaker: Secondary | ICD-10-CM | POA: Diagnosis not present

## 2024-03-03 DIAGNOSIS — Z7901 Long term (current) use of anticoagulants: Secondary | ICD-10-CM | POA: Diagnosis not present

## 2024-03-03 DIAGNOSIS — I4891 Unspecified atrial fibrillation: Secondary | ICD-10-CM | POA: Insufficient documentation

## 2024-03-03 DIAGNOSIS — Z7984 Long term (current) use of oral hypoglycemic drugs: Secondary | ICD-10-CM | POA: Diagnosis not present

## 2024-03-03 DIAGNOSIS — D61818 Other pancytopenia: Secondary | ICD-10-CM | POA: Diagnosis not present

## 2024-03-03 DIAGNOSIS — W19XXXA Unspecified fall, initial encounter: Secondary | ICD-10-CM | POA: Diagnosis not present

## 2024-03-03 DIAGNOSIS — E119 Type 2 diabetes mellitus without complications: Secondary | ICD-10-CM | POA: Diagnosis not present

## 2024-03-03 DIAGNOSIS — M545 Low back pain, unspecified: Secondary | ICD-10-CM | POA: Diagnosis not present

## 2024-03-03 DIAGNOSIS — M542 Cervicalgia: Secondary | ICD-10-CM | POA: Insufficient documentation

## 2024-03-03 DIAGNOSIS — M546 Pain in thoracic spine: Secondary | ICD-10-CM | POA: Insufficient documentation

## 2024-03-03 DIAGNOSIS — Z79899 Other long term (current) drug therapy: Secondary | ICD-10-CM | POA: Diagnosis not present

## 2024-03-03 DIAGNOSIS — M25552 Pain in left hip: Secondary | ICD-10-CM | POA: Insufficient documentation

## 2024-03-03 DIAGNOSIS — M549 Dorsalgia, unspecified: Secondary | ICD-10-CM

## 2024-03-03 DIAGNOSIS — G309 Alzheimer's disease, unspecified: Secondary | ICD-10-CM | POA: Diagnosis not present

## 2024-03-03 LAB — CBC WITH DIFFERENTIAL/PLATELET
Abs Immature Granulocytes: 0 K/uL (ref 0.00–0.07)
Basophils Absolute: 0 K/uL (ref 0.0–0.1)
Basophils Relative: 1 %
Eosinophils Absolute: 0 K/uL (ref 0.0–0.5)
Eosinophils Relative: 1 %
HCT: 31.2 % — ABNORMAL LOW (ref 39.0–52.0)
Hemoglobin: 10.5 g/dL — ABNORMAL LOW (ref 13.0–17.0)
Immature Granulocytes: 0 %
Lymphocytes Relative: 18 %
Lymphs Abs: 0.4 K/uL — ABNORMAL LOW (ref 0.7–4.0)
MCH: 35.1 pg — ABNORMAL HIGH (ref 26.0–34.0)
MCHC: 33.7 g/dL (ref 30.0–36.0)
MCV: 104.3 fL — ABNORMAL HIGH (ref 80.0–100.0)
Monocytes Absolute: 0.3 K/uL (ref 0.1–1.0)
Monocytes Relative: 12 %
Neutro Abs: 1.5 K/uL — ABNORMAL LOW (ref 1.7–7.7)
Neutrophils Relative %: 68 %
Platelets: 82 K/uL — ABNORMAL LOW (ref 150–400)
RBC: 2.99 MIL/uL — ABNORMAL LOW (ref 4.22–5.81)
RDW: 14.5 % (ref 11.5–15.5)
WBC: 2.2 K/uL — ABNORMAL LOW (ref 4.0–10.5)
nRBC: 0 % (ref 0.0–0.2)

## 2024-03-03 LAB — COMPREHENSIVE METABOLIC PANEL WITH GFR
ALT: 14 U/L (ref 0–44)
AST: 26 U/L (ref 15–41)
Albumin: 3.7 g/dL (ref 3.5–5.0)
Alkaline Phosphatase: 156 U/L — ABNORMAL HIGH (ref 38–126)
Anion gap: 6 (ref 5–15)
BUN: 30 mg/dL — ABNORMAL HIGH (ref 8–23)
CO2: 29 mmol/L (ref 22–32)
Calcium: 9.2 mg/dL (ref 8.9–10.3)
Chloride: 106 mmol/L (ref 98–111)
Creatinine, Ser: 1.55 mg/dL — ABNORMAL HIGH (ref 0.61–1.24)
GFR, Estimated: 45 mL/min — ABNORMAL LOW
Glucose, Bld: 160 mg/dL — ABNORMAL HIGH (ref 70–99)
Potassium: 5.1 mmol/L (ref 3.5–5.1)
Sodium: 141 mmol/L (ref 135–145)
Total Bilirubin: 0.5 mg/dL (ref 0.0–1.2)
Total Protein: 7.8 g/dL (ref 6.5–8.1)

## 2024-03-03 LAB — CK: Total CK: 95 U/L (ref 49–397)

## 2024-03-03 NOTE — Discharge Instructions (Signed)
 You were seen for your fall in the emergency department.  You do not have any new traumatic injuries.  At home, please take Tylenol  and use lidocaine  patches for your pain from the fall.  Ice your hip.    Check your MyChart online for the results of any tests that had not resulted by the time you left the emergency department.   Follow-up with your primary doctor in 2-3 days regarding your visit.    Return immediately to the emergency department if you experience any of the following: Worsening pain, or any other concerning symptoms.    Thank you for visiting our Emergency Department. It was a pleasure taking care of you today.

## 2024-03-03 NOTE — ED Triage Notes (Signed)
 Pt had  unwitnessed fall at nursing home and c/o left hip and back pain since fall. Pt is on Eliquis and may have hit his head.

## 2024-03-03 NOTE — ED Provider Notes (Signed)
 " [place] EMERGENCY DEPARTMENT AT [facility] Provider Note   CSN: [id number] Arrival date & time: 03/03/24  2005     Patient presents with: Jay Campbell Jay Campbell is a 78 y.o. male.  {Add pertinent medical, surgical, social history, OB history to HPI:1928} 78 year old male history of atrial fibrillation on Eliquis, CHB status post pacemaker, thrombocytopenia, diabetes, and Alzheimer's who presents to the emergency department after an unwitnessed fall complaining of back and left hip pain.  Per [facility] staff the patient was found on the ground near her recliner.  Was on the ground for less than 10 minutes.  Has been complaining of back pain and left hip pain since then.  They think he could have hit his head.  Patient is unable to tell me exactly what happened       Prior to Admission medications  Medication Sig Start Date End Date Taking? Authorizing Provider  allopurinol  (ZYLOPRIM ) 100 MG tablet Take 100 mg by mouth Daily at 5am. 12/15/11   [provider]  ARIPiprazole (ABILIFY) 5 MG tablet Take 5 mg by mouth daily. 11/05/23   [provider]  ascorbic acid (VITAMIN C) 500 MG tablet Take 500 mg by mouth daily.    [provider]  BD INSULIN  SYRINGE U/F 31G X 5/16 0.3 ML MISC  06/26/19   [provider]  Calcium Carb-Cholecalciferol (OYSTER SHELL CALCIUM W/D) 500-5 MG-MCG TABS Take 1 tablet by mouth.    [provider]  cholecalciferol (VITAMIN D3) 25 MCG (1000 UNIT) tablet Take 2,000 Units by mouth daily.    [provider]  colchicine  0.6 MG tablet Take 0.6 mg by mouth as needed.    [provider]  Continuous Blood Gluc Sensor (FREESTYLE LIBRE 14 DAY SENSOR) MISC  06/02/21   [provider]  diphenhydrAMINE-APAP, sleep, (TYLENOL  PM EXTRA STRENGTH) 50-1000 MG/30ML LIQD Take 1,000 mLs by mouth daily.    [provider]  divalproex (DEPAKOTE SPRINKLE) 125 MG capsule Take 125 mg by mouth.     [provider]  donepezil  (ARICEPT ) 10 MG tablet Take 1 tablet (10 mg total) by mouth at bedtime. 01/13/22   Whitfield Raisin, NP  doxycycline  (VIBRAMYCIN ) 100 MG capsule Take 1 capsule (100 mg total) by mouth 2 (two) times daily. 12/13/23   Dean Clarity, MD  ELIQUIS 2.5 MG TABS tablet Take 2.5 mg by mouth 2 (two) times daily. 01/13/22   [provider]  famotidine (PEPCID) 20 MG tablet Take 20 mg by mouth 2 (two) times daily. 07/05/19   [provider]  ferrous sulfate 325 (65 FE) MG EC tablet Take 325 mg by mouth daily with breakfast.    [provider]  glipiZIDE (GLUCOTROL) 5 MG tablet Take 5 mg by mouth daily. 01/13/22   [provider]  HUMULIN 70/30 (70-30) 100 UNIT/ML injection Inject into the skin. 05/25/21   [provider]  HYDROcodone -acetaminophen  (NORCO/VICODIN) 5-325 MG tablet Take 0.5 tablets by mouth daily. 10/10/22   [provider]  hydrOXYzine (ATARAX) 10 MG tablet Take 10 mg by mouth 3 (three) times daily as needed for nausea.    [provider]  isosorbide  mononitrate (IMDUR ) 60 MG 24 hr tablet Take 1 tablet (60 mg total) by mouth daily. 04/17/20   Allred, Lynwood, MD  latanoprost (XALATAN) 0.005 % ophthalmic solution  02/23/21   [provider]  lidocaine  (LIDODERM ) 5 % Place 1 patch onto the skin daily. Remove & Discard patch within  12 hours or as directed by MD 02/07/23   Henderly, Britni A, PA-C  lisinopril (ZESTRIL) 10 MG tablet Take 10 mg by mouth every evening. 12/10/18   [provider]  LORazepam (ATIVAN) 0.5 MG tablet Take 0.25 mg by mouth every 6 (six) hours as needed. 11/23/22   [provider]  LUMIGAN 0.01 % SOLN Place 1 drop into both eyes. 09/16/22   [provider]  meclizine (ANTIVERT) 25 MG tablet Take 25 mg by mouth 3 (three) times daily as needed for dizziness.    [provider]  memantine  (NAMENDA ) 10 MG tablet TAKE 1 TABLET BY MOUTH TWICE  DAILY  11/02/21   Whitfield Raisin, NP  Menthol, Topical Analgesic, (BIOFREEZE) 4 % GEL Apply topically as needed.    [provider]  metFORMIN (GLUCOPHAGE) 1000 MG tablet Take 1,000 mg by mouth 2 (two) times daily with a meal.    [provider]  metoprolol  tartrate (LOPRESSOR ) 25 MG tablet Take 25 mg by mouth.    [provider]  nitroGLYCERIN  (NITROSTAT ) 0.4 MG SL tablet Place 1 tablet (0.4 mg total) under the tongue every 5 (five) minutes x 3 doses as needed. 04/15/16   Kelsie Agent, MD  [facility] VERIO test strip  08/17/17   [provider]  oxyCODONE -acetaminophen  (PERCOCET/ROXICET) 5-325 MG tablet Take 1 tablet by mouth every 6 (six) hours as needed for severe pain (pain score 7-10). 02/07/23   Henderly, Britni A, PA-C  sertraline  (ZOLOFT ) 100 MG tablet Take 125 mg by mouth daily. 04/19/19   [provider]  simvastatin  (ZOCOR ) 40 MG tablet Take 40 mg by mouth at bedtime.    [provider]  tamsulosin (FLOMAX) 0.4 MG CAPS capsule Take 0.4 mg by mouth daily. 09/03/20   [provider]    Allergies: Contrast media [iodinated contrast media], Vioxx [rofecoxib], Shellfish allergy, and Sulfonamide derivatives    Review of Systems  Updated Vital Signs BP (!) 152/79   Pulse 62   Temp 97.9 F (36.6 C)   Resp 15   Ht 5' 5 (1.651 m)   Wt 78.9 kg   SpO2 100%   BMI 28.95 kg/m   Physical Exam Constitutional:      Appearance: Normal appearance.  HENT:     Head: Normocephalic and atraumatic.     Right Ear: External ear normal.     Left Ear: External ear normal.  Eyes:     Extraocular Movements: Extraocular movements intact.     Conjunctiva/sclera: Conjunctivae normal.     Pupils: Pupils are equal, round, and reactive to light.  Neck:     Comments: Mild C-spine tenderness palpation Cardiovascular:     Rate and Rhythm: Normal rate and regular rhythm.  Pulmonary:     Effort: Pulmonary effort is normal.     Breath sounds: Normal breath  sounds.  Abdominal:     General: There is no distension.     Palpations: There is no mass.     Tenderness: There is no abdominal tenderness. There is no guarding.  Musculoskeletal:     Comments: No tenderness palpation bilateral shoulders, elbows, or wrists.  Tenderness palpation of thoracic and lumbar spine.  Tenderness palpation of the left hip.  Neurological:     Mental Status: He is alert. Mental status is at baseline.     Cranial Nerves: No cranial nerve deficit.     Sensory: No sensory deficit.     Motor: No weakness.     (all  labs ordered are listed, but only abnormal results are displayed) Labs Reviewed - No data to display  EKG: None  Radiology: No results found.  {Document cardiac monitor, telemetry assessment procedure when appropriate:32947} Procedures   Medications Ordered in the ED - No data to display  Clinical Course as of 03/03/24 2342  Sun Mar 03, 2024  2227 Creatinine(!): 1.55 At baseline [RP]  2227 Pancytopenia unchanged from prior [RP]    Clinical Course User Index [RP] Yolande Lamar BROCKS, MD   {Click here for ABCD2, HEART and other calculators REFRESH Note before signing:1}                              Medical Decision Making Amount and/or Complexity of Data Reviewed Labs: ordered. Decision-making details documented in ED Course. Radiology: ordered.   PRATHER FAILLA is a 78 year old male history of atrial fibrillation on Eliquis, CHB status post pacemaker, thrombocytopenia, diabetes, and Alzheimer's who presents to the emergency department after an unwitnessed fall complaining of back and left hip pain.    Initial Ddx:  ***   MDM/Course:  *** Upon re-evaluation ***  This patient presents to the ED for concern of complaints listed in HPI, this involves an extensive number of treatment options, and is a complaint that carries with it a high risk of complications and morbidity. Disposition including potential need for admission considered.    Dispo: {Disposition:28069}  I have reviewed the patients home medications and made adjustments as needed Additional history obtained from {Additional History:28067} Records reviewed {Records Reviewed:28068} The following labs were independently interpreted: {labs interpreted:28064} and show {lab findings:28250} I independently reviewed the following imaging with scope of interpretation limited to determining acute life threatening conditions related to emergency care: {imaging interpreted:28065} and agree with the radiologist interpretation with the following exceptions: none I personally reviewed and interpreted cardiac monitoring: {cardiac monitoring:28251} I personally reviewed and interpreted the pt's EKG: see above for interpretation  Consults: {Consultants:28063} Social Determinants of health:  ***  Portions of this note were generated with Scientist, clinical (histocompatibility and immunogenetics). Dictation errors may occur despite best attempts at proofreading.     Final diagnoses:  None    ED Discharge Orders     None        "

## 2024-03-04 NOTE — ED Notes (Signed)
 Discharge instructions reviewed with patient. Patient questions answered and opportunity for education reviewed. Patient voices understanding of discharge instructions with no further question. Report called to facility and discharge instructions reviewed.

## 2024-03-11 ENCOUNTER — Encounter: Payer: Self-pay | Admitting: *Deleted

## 2024-03-13 ENCOUNTER — Ambulatory Visit: Payer: Self-pay | Admitting: Cardiovascular Disease

## 2024-03-24 ENCOUNTER — Emergency Department (HOSPITAL_COMMUNITY)

## 2024-03-24 ENCOUNTER — Encounter (HOSPITAL_COMMUNITY): Payer: Self-pay | Admitting: Emergency Medicine

## 2024-03-24 ENCOUNTER — Other Ambulatory Visit: Payer: Self-pay

## 2024-03-24 ENCOUNTER — Inpatient Hospital Stay (HOSPITAL_COMMUNITY)
Admission: EM | Admit: 2024-03-24 | Discharge: 2024-03-31 | DRG: 871 | Disposition: A | Discharge status: Skilled Nursing Facility | Attending: Internal Medicine | Admitting: Internal Medicine

## 2024-03-24 DIAGNOSIS — E860 Dehydration: Secondary | ICD-10-CM | POA: Diagnosis present

## 2024-03-24 DIAGNOSIS — E1122 Type 2 diabetes mellitus with diabetic chronic kidney disease: Secondary | ICD-10-CM | POA: Diagnosis present

## 2024-03-24 DIAGNOSIS — Z66 Do not resuscitate: Secondary | ICD-10-CM | POA: Diagnosis present

## 2024-03-24 DIAGNOSIS — E785 Hyperlipidemia, unspecified: Secondary | ICD-10-CM | POA: Diagnosis present

## 2024-03-24 DIAGNOSIS — Z881 Allergy status to other antibiotic agents status: Secondary | ICD-10-CM

## 2024-03-24 DIAGNOSIS — G9340 Encephalopathy, unspecified: Secondary | ICD-10-CM | POA: Diagnosis present

## 2024-03-24 DIAGNOSIS — D472 Monoclonal gammopathy: Secondary | ICD-10-CM | POA: Diagnosis present

## 2024-03-24 DIAGNOSIS — E872 Acidosis, unspecified: Secondary | ICD-10-CM | POA: Diagnosis present

## 2024-03-24 DIAGNOSIS — I129 Hypertensive chronic kidney disease with stage 1 through stage 4 chronic kidney disease, or unspecified chronic kidney disease: Secondary | ICD-10-CM | POA: Diagnosis present

## 2024-03-24 DIAGNOSIS — Z7901 Long term (current) use of anticoagulants: Secondary | ICD-10-CM | POA: Diagnosis not present

## 2024-03-24 DIAGNOSIS — E43 Unspecified severe protein-calorie malnutrition: Secondary | ICD-10-CM | POA: Diagnosis present

## 2024-03-24 DIAGNOSIS — N179 Acute kidney failure, unspecified: Secondary | ICD-10-CM | POA: Diagnosis present

## 2024-03-24 DIAGNOSIS — L89626 Pressure-induced deep tissue damage of left heel: Secondary | ICD-10-CM | POA: Diagnosis present

## 2024-03-24 DIAGNOSIS — J189 Pneumonia, unspecified organism: Secondary | ICD-10-CM | POA: Diagnosis present

## 2024-03-24 DIAGNOSIS — G309 Alzheimer's disease, unspecified: Secondary | ICD-10-CM | POA: Diagnosis present

## 2024-03-24 DIAGNOSIS — E870 Hyperosmolality and hypernatremia: Secondary | ICD-10-CM | POA: Diagnosis present

## 2024-03-24 DIAGNOSIS — E1165 Type 2 diabetes mellitus with hyperglycemia: Secondary | ICD-10-CM | POA: Diagnosis present

## 2024-03-24 DIAGNOSIS — G9341 Metabolic encephalopathy: Secondary | ICD-10-CM | POA: Diagnosis present

## 2024-03-24 DIAGNOSIS — N401 Enlarged prostate with lower urinary tract symptoms: Secondary | ICD-10-CM | POA: Diagnosis present

## 2024-03-24 DIAGNOSIS — Z9889 Other specified postprocedural states: Secondary | ICD-10-CM

## 2024-03-24 DIAGNOSIS — D61818 Other pancytopenia: Secondary | ICD-10-CM | POA: Diagnosis present

## 2024-03-24 DIAGNOSIS — I2510 Atherosclerotic heart disease of native coronary artery without angina pectoris: Secondary | ICD-10-CM | POA: Diagnosis present

## 2024-03-24 DIAGNOSIS — Z794 Long term (current) use of insulin: Secondary | ICD-10-CM | POA: Diagnosis not present

## 2024-03-24 DIAGNOSIS — I442 Atrioventricular block, complete: Secondary | ICD-10-CM | POA: Diagnosis present

## 2024-03-24 DIAGNOSIS — Z6829 Body mass index (BMI) 29.0-29.9, adult: Secondary | ICD-10-CM

## 2024-03-24 DIAGNOSIS — A419 Sepsis, unspecified organism: Principal | ICD-10-CM | POA: Diagnosis present

## 2024-03-24 DIAGNOSIS — Z1152 Encounter for screening for COVID-19: Secondary | ICD-10-CM | POA: Diagnosis not present

## 2024-03-24 DIAGNOSIS — J9601 Acute respiratory failure with hypoxia: Secondary | ICD-10-CM | POA: Diagnosis present

## 2024-03-24 DIAGNOSIS — I4821 Permanent atrial fibrillation: Secondary | ICD-10-CM | POA: Diagnosis present

## 2024-03-24 DIAGNOSIS — Z8249 Family history of ischemic heart disease and other diseases of the circulatory system: Secondary | ICD-10-CM

## 2024-03-24 DIAGNOSIS — Z91041 Radiographic dye allergy status: Secondary | ICD-10-CM

## 2024-03-24 DIAGNOSIS — R6520 Severe sepsis without septic shock: Secondary | ICD-10-CM | POA: Diagnosis present

## 2024-03-24 DIAGNOSIS — I495 Sick sinus syndrome: Secondary | ICD-10-CM | POA: Diagnosis present

## 2024-03-24 DIAGNOSIS — Z950 Presence of cardiac pacemaker: Secondary | ICD-10-CM | POA: Diagnosis not present

## 2024-03-24 DIAGNOSIS — Z7984 Long term (current) use of oral hypoglycemic drugs: Secondary | ICD-10-CM

## 2024-03-24 DIAGNOSIS — Z79899 Other long term (current) drug therapy: Secondary | ICD-10-CM

## 2024-03-24 DIAGNOSIS — Z22322 Carrier or suspected carrier of Methicillin resistant Staphylococcus aureus: Secondary | ICD-10-CM

## 2024-03-24 DIAGNOSIS — Z91013 Allergy to seafood: Secondary | ICD-10-CM

## 2024-03-24 DIAGNOSIS — Z882 Allergy status to sulfonamides status: Secondary | ICD-10-CM

## 2024-03-24 DIAGNOSIS — K219 Gastro-esophageal reflux disease without esophagitis: Secondary | ICD-10-CM | POA: Diagnosis present

## 2024-03-24 LAB — RENAL FUNCTION PANEL
Albumin: 2.8 g/dL — ABNORMAL LOW (ref 3.5–5.0)
Anion gap: 14 (ref 5–15)
BUN: 64 mg/dL — ABNORMAL HIGH (ref 8–23)
CO2: 15 mmol/L — ABNORMAL LOW (ref 22–32)
Calcium: 7.9 mg/dL — ABNORMAL LOW (ref 8.9–10.3)
Chloride: 127 mmol/L — ABNORMAL HIGH (ref 98–111)
Creatinine, Ser: 1.89 mg/dL — ABNORMAL HIGH (ref 0.61–1.24)
GFR, Estimated: 35 mL/min — ABNORMAL LOW
Glucose, Bld: 331 mg/dL — ABNORMAL HIGH (ref 70–99)
Phosphorus: 2.3 mg/dL — ABNORMAL LOW (ref 2.5–4.6)
Potassium: 4.5 mmol/L (ref 3.5–5.1)
Sodium: 156 mmol/L — ABNORMAL HIGH (ref 135–145)

## 2024-03-24 LAB — HEMOGLOBIN A1C
Hgb A1c MFr Bld: 7.3 % — ABNORMAL HIGH (ref 4.8–5.6)
Mean Plasma Glucose: 162.81 mg/dL

## 2024-03-24 LAB — COMPREHENSIVE METABOLIC PANEL WITH GFR
ALT: <5 U/L (ref 0–44)
AST: 15 U/L (ref 15–41)
Albumin: 2.6 g/dL — ABNORMAL LOW (ref 3.5–5.0)
Alkaline Phosphatase: 90 U/L (ref 38–126)
Anion gap: 14 (ref 5–15)
BUN: 60 mg/dL — ABNORMAL HIGH (ref 8–23)
CO2: 17 mmol/L — ABNORMAL LOW (ref 22–32)
Calcium: 7.6 mg/dL — ABNORMAL LOW (ref 8.9–10.3)
Chloride: 124 mmol/L — ABNORMAL HIGH (ref 98–111)
Creatinine, Ser: 1.96 mg/dL — ABNORMAL HIGH (ref 0.61–1.24)
GFR, Estimated: 34 mL/min — ABNORMAL LOW
Glucose, Bld: 239 mg/dL — ABNORMAL HIGH (ref 70–99)
Potassium: 4.7 mmol/L (ref 3.5–5.1)
Sodium: 155 mmol/L — ABNORMAL HIGH (ref 135–145)
Total Bilirubin: 0.8 mg/dL (ref 0.0–1.2)
Total Protein: 5.9 g/dL — ABNORMAL LOW (ref 6.5–8.1)

## 2024-03-24 LAB — URINALYSIS, W/ REFLEX TO CULTURE (INFECTION SUSPECTED)
Bilirubin Urine: NEGATIVE
Glucose, UA: NEGATIVE mg/dL
Ketones, ur: 5 mg/dL — AB
Nitrite: NEGATIVE
Protein, ur: 100 mg/dL — AB
RBC / HPF: >50 RBC/hpf (ref 0–5)
Specific Gravity, Urine: 1.023 (ref 1.005–1.030)
pH: 5 (ref 5.0–8.0)

## 2024-03-24 LAB — RESP PANEL BY RT-PCR (RSV, FLU A&B, COVID)  RVPGX2
Influenza A by PCR: NEGATIVE
Influenza B by PCR: NEGATIVE
Resp Syncytial Virus by PCR: NEGATIVE
SARS Coronavirus 2 by RT PCR: NEGATIVE

## 2024-03-24 LAB — CBC WITH DIFFERENTIAL/PLATELET
Abs Immature Granulocytes: 0.1 K/uL — ABNORMAL HIGH (ref 0.00–0.07)
Band Neutrophils: 16 %
Basophils Absolute: 0 K/uL (ref 0.0–0.1)
Basophils Relative: 0 %
Eosinophils Absolute: 0 K/uL (ref 0.0–0.5)
Eosinophils Relative: 0 %
HCT: 30.9 % — ABNORMAL LOW (ref 39.0–52.0)
Hemoglobin: 9.8 g/dL — ABNORMAL LOW (ref 13.0–17.0)
Lymphocytes Relative: 30 %
Lymphs Abs: 0.9 K/uL (ref 0.7–4.0)
MCH: 34 pg (ref 26.0–34.0)
MCHC: 31.7 g/dL (ref 30.0–36.0)
MCV: 107.3 fL — ABNORMAL HIGH (ref 80.0–100.0)
Monocytes Absolute: 0.1 K/uL (ref 0.1–1.0)
Monocytes Relative: 3 %
Myelocytes: 3 %
Neutro Abs: 1.9 K/uL (ref 1.7–7.7)
Neutrophils Relative %: 48 %
Platelets: 68 K/uL — ABNORMAL LOW (ref 150–400)
RBC: 2.88 MIL/uL — ABNORMAL LOW (ref 4.22–5.81)
RDW: 14.9 % (ref 11.5–15.5)
WBC: 3 K/uL — ABNORMAL LOW (ref 4.0–10.5)
nRBC: 0 % (ref 0.0–0.2)

## 2024-03-24 LAB — GLUCOSE, CAPILLARY
Glucose-Capillary: 223 mg/dL — ABNORMAL HIGH (ref 70–99)
Glucose-Capillary: 297 mg/dL — ABNORMAL HIGH (ref 70–99)
Glucose-Capillary: 290 mg/dL — ABNORMAL HIGH (ref 70–99)
Glucose-Capillary: 275 mg/dL — ABNORMAL HIGH (ref 70–99)

## 2024-03-24 LAB — LACTIC ACID, PLASMA
Lactic Acid, Venous: 2.6 mmol/L (ref 0.5–1.9)
Lactic Acid, Venous: 2.6 mmol/L (ref 0.5–1.9)
Lactic Acid, Venous: 3.4 mmol/L (ref 0.5–1.9)

## 2024-03-24 LAB — BLOOD GAS, VENOUS
Acid-base deficit: 9.6 mmol/L — ABNORMAL HIGH (ref 0.0–2.0)
Bicarbonate: 15.7 mmol/L — ABNORMAL LOW (ref 20.0–28.0)
Drawn by: 42438
O2 Saturation: 69 %
Patient temperature: 38.9
pCO2, Ven: 35 mmHg — ABNORMAL LOW (ref 44–60)
pH, Ven: 7.27 (ref 7.25–7.43)
pO2, Ven: 45 mmHg (ref 32–45)

## 2024-03-24 LAB — CBG MONITORING, ED: Glucose-Capillary: 211 mg/dL — ABNORMAL HIGH (ref 70–99)

## 2024-03-24 LAB — PROTIME-INR
INR: 2.9 — ABNORMAL HIGH (ref 0.8–1.2)
Prothrombin Time: 31.8 s — ABNORMAL HIGH (ref 11.4–15.2)

## 2024-03-24 LAB — MRSA NEXT GEN BY PCR, NASAL: MRSA by PCR Next Gen: DETECTED — AB

## 2024-03-24 MED ORDER — SODIUM CHLORIDE 0.9 % IV SOLN: INTRAVENOUS | Status: DC

## 2024-03-24 MED ORDER — ORAL CARE MOUTH RINSE
15.0000 mL | OROMUCOSAL | Status: DC
  Administered 2024-03-25 – 2024-03-31 (×27): 15 mL via OROMUCOSAL

## 2024-03-24 MED ORDER — BISACODYL 10 MG RE SUPP
10.0000 mg | Freq: Every day | RECTAL | Status: DC | PRN
  Administered 2024-03-28: 10 mg via RECTAL
  Filled 2024-03-24: qty 1

## 2024-03-24 MED ORDER — IPRATROPIUM-ALBUTEROL 0.5-2.5 (3) MG/3ML IN SOLN
3.0000 mL | Freq: Four times a day (QID) | RESPIRATORY_TRACT | Status: DC
  Administered 2024-03-24 (×3): 3 mL via RESPIRATORY_TRACT
  Filled 2024-03-24 (×3): qty 3

## 2024-03-24 MED ORDER — SODIUM CHLORIDE 0.9 % IV SOLN: 1.0000 g | Freq: Once | INTRAVENOUS | Status: DC

## 2024-03-24 MED ORDER — ASPIRIN 300 MG RE SUPP
300.0000 mg | Freq: Once | RECTAL | Status: AC
  Administered 2024-03-24: 300 mg via RECTAL
  Filled 2024-03-24: qty 1

## 2024-03-24 MED ORDER — FERROUS SULFATE 325 (65 FE) MG PO TABS
325.0000 mg | ORAL_TABLET | Freq: Every day | ORAL | Status: DC
  Administered 2024-03-26 – 2024-03-31 (×6): 325 mg via ORAL
  Filled 2024-03-24 (×7): qty 1

## 2024-03-24 MED ORDER — DM-GUAIFENESIN ER 30-600 MG PO TB12
1.0000 | ORAL_TABLET | Freq: Two times a day (BID) | ORAL | Status: DC
  Administered 2024-03-25 – 2024-03-26 (×4): 1 via ORAL
  Filled 2024-03-24 (×5): qty 1

## 2024-03-24 MED ORDER — SODIUM CHLORIDE 0.9 % IV BOLUS (SEPSIS)
1000.0000 mL | Freq: Once | INTRAVENOUS | Status: AC
  Administered 2024-03-24: 1000 mL via INTRAVENOUS

## 2024-03-24 MED ORDER — SODIUM CHLORIDE 0.9 % IV SOLN
2.0000 g | INTRAVENOUS | Status: AC
  Administered 2024-03-25 – 2024-03-29 (×5): 2 g via INTRAVENOUS
  Filled 2024-03-24 (×5): qty 20

## 2024-03-24 MED ORDER — LACTATED RINGERS IV BOLUS
500.0000 mL | Freq: Once | INTRAVENOUS | Status: AC
  Administered 2024-03-24: 500 mL via INTRAVENOUS

## 2024-03-24 MED ORDER — SODIUM CHLORIDE 0.9 % IV SOLN
500.0000 mg | Freq: Once | INTRAVENOUS | Status: AC
  Administered 2024-03-24: 500 mg via INTRAVENOUS
  Filled 2024-03-24: qty 5

## 2024-03-24 MED ORDER — TAMSULOSIN HCL 0.4 MG PO CAPS
0.4000 mg | ORAL_CAPSULE | Freq: Every day | ORAL | Status: DC
  Administered 2024-03-25 – 2024-03-31 (×7): 0.4 mg via ORAL
  Filled 2024-03-24 (×8): qty 1

## 2024-03-24 MED ORDER — TRAZODONE HCL 50 MG PO TABS: 50.0000 mg | ORAL_TABLET | Freq: Every evening | ORAL | Status: DC | PRN

## 2024-03-24 MED ORDER — ONDANSETRON HCL 4 MG PO TABS: 4.0000 mg | ORAL_TABLET | Freq: Four times a day (QID) | ORAL | Status: DC | PRN

## 2024-03-24 MED ORDER — METHYLPREDNISOLONE SODIUM SUCC 40 MG IJ SOLR
40.0000 mg | Freq: Two times a day (BID) | INTRAMUSCULAR | Status: DC
  Administered 2024-03-24 – 2024-03-26 (×5): 40 mg via INTRAVENOUS
  Filled 2024-03-24 (×5): qty 1

## 2024-03-24 MED ORDER — ORAL CARE MOUTH RINSE: 15.0000 mL | OROMUCOSAL | Status: DC | PRN

## 2024-03-24 MED ORDER — POLYETHYLENE GLYCOL 3350 17 G PO PACK: 17.0000 g | PACK | Freq: Every day | ORAL | Status: DC | PRN

## 2024-03-24 MED ORDER — INSULIN ASPART 100 UNIT/ML IJ SOLN
0.0000 [IU] | Freq: Every day | INTRAMUSCULAR | Status: DC
  Administered 2024-03-24: 3 [IU] via SUBCUTANEOUS
  Administered 2024-03-25: 2 [IU] via SUBCUTANEOUS
  Administered 2024-03-26 – 2024-03-27 (×2): 3 [IU] via SUBCUTANEOUS
  Administered 2024-03-28: 2 [IU] via SUBCUTANEOUS
  Administered 2024-03-29: 3 [IU] via SUBCUTANEOUS
  Filled 2024-03-24 (×8): qty 1

## 2024-03-24 MED ORDER — INSULIN ASPART 100 UNIT/ML IJ SOLN
0.0000 [IU] | Freq: Three times a day (TID) | INTRAMUSCULAR | Status: DC
  Administered 2024-03-24 – 2024-03-25 (×3): 5 [IU] via SUBCUTANEOUS
  Administered 2024-03-25: 7 [IU] via SUBCUTANEOUS
  Administered 2024-03-25 – 2024-03-26 (×2): 5 [IU] via SUBCUTANEOUS
  Administered 2024-03-26: 7 [IU] via SUBCUTANEOUS
  Administered 2024-03-26: 5 [IU] via SUBCUTANEOUS
  Administered 2024-03-27: 3 [IU] via SUBCUTANEOUS
  Administered 2024-03-27 – 2024-03-28 (×3): 5 [IU] via SUBCUTANEOUS
  Administered 2024-03-28 – 2024-03-29 (×4): 3 [IU] via SUBCUTANEOUS
  Administered 2024-03-29: 2 [IU] via SUBCUTANEOUS
  Administered 2024-03-30: 5 [IU] via SUBCUTANEOUS
  Administered 2024-03-30: 7 [IU] via SUBCUTANEOUS
  Administered 2024-03-30: 3 [IU] via SUBCUTANEOUS
  Filled 2024-03-24 (×19): qty 1

## 2024-03-24 MED ORDER — SODIUM CHLORIDE 0.9 % IV SOLN
100.0000 mg | Freq: Two times a day (BID) | INTRAVENOUS | Status: DC
  Administered 2024-03-25 – 2024-03-27 (×5): 100 mg via INTRAVENOUS
  Filled 2024-03-24 (×7): qty 100

## 2024-03-24 MED ORDER — APIXABAN 2.5 MG PO TABS
2.5000 mg | ORAL_TABLET | Freq: Two times a day (BID) | ORAL | Status: DC
  Filled 2024-03-24: qty 1

## 2024-03-24 MED ORDER — ACETAMINOPHEN 325 MG PO TABS
650.0000 mg | ORAL_TABLET | Freq: Four times a day (QID) | ORAL | Status: DC | PRN
  Administered 2024-03-25: 650 mg via ORAL
  Filled 2024-03-24: qty 2

## 2024-03-24 MED ORDER — METOPROLOL TARTRATE 25 MG PO TABS
25.0000 mg | ORAL_TABLET | Freq: Two times a day (BID) | ORAL | Status: DC
  Administered 2024-03-25 – 2024-03-31 (×13): 25 mg via ORAL
  Filled 2024-03-24 (×13): qty 1

## 2024-03-24 MED ORDER — IPRATROPIUM-ALBUTEROL 0.5-2.5 (3) MG/3ML IN SOLN
3.0000 mL | Freq: Three times a day (TID) | RESPIRATORY_TRACT | Status: DC
  Administered 2024-03-25 – 2024-03-31 (×20): 3 mL via RESPIRATORY_TRACT
  Filled 2024-03-24 (×18): qty 3

## 2024-03-24 MED ORDER — SODIUM CHLORIDE 0.9 % IV SOLN
1.0000 g | Freq: Once | INTRAVENOUS | Status: AC
  Administered 2024-03-24: 1 g via INTRAVENOUS
  Filled 2024-03-24: qty 10

## 2024-03-24 MED ORDER — PANTOPRAZOLE SODIUM 40 MG PO TBEC
40.0000 mg | DELAYED_RELEASE_TABLET | Freq: Every day | ORAL | Status: DC
  Administered 2024-03-25 – 2024-03-31 (×7): 40 mg via ORAL
  Filled 2024-03-24 (×8): qty 1

## 2024-03-24 MED ORDER — SODIUM CHLORIDE 0.9% FLUSH
3.0000 mL | Freq: Two times a day (BID) | INTRAVENOUS | Status: DC
  Administered 2024-03-24 – 2024-03-31 (×13): 3 mL via INTRAVENOUS

## 2024-03-24 MED ORDER — BIOTENE DRY MOUTH MT LIQD: 15.0000 mL | OROMUCOSAL | Status: DC | PRN

## 2024-03-24 MED ORDER — SODIUM CHLORIDE 0.9 % IV SOLN: INTRAVENOUS | Status: AC | PRN

## 2024-03-24 MED ORDER — CHLORHEXIDINE GLUCONATE CLOTH 2 % EX PADS
6.0000 | MEDICATED_PAD | Freq: Every day | CUTANEOUS | Status: DC
  Administered 2024-03-24 – 2024-03-29 (×6): 6 via TOPICAL

## 2024-03-24 MED ORDER — ALLOPURINOL 100 MG PO TABS
100.0000 mg | ORAL_TABLET | Freq: Every day | ORAL | Status: DC
  Administered 2024-03-25 – 2024-03-31 (×7): 100 mg via ORAL
  Filled 2024-03-24 (×8): qty 1

## 2024-03-24 MED ORDER — LATANOPROST 0.005 % OP SOLN
1.0000 [drp] | Freq: Every day | OPHTHALMIC | Status: DC
  Administered 2024-03-24 – 2024-03-30 (×7): 1 [drp] via OPHTHALMIC
  Filled 2024-03-24: qty 2.5

## 2024-03-24 MED ORDER — SODIUM CHLORIDE 0.9 % IV BOLUS (SEPSIS)
500.0000 mL | Freq: Once | INTRAVENOUS | Status: AC
  Administered 2024-03-24: 500 mL via INTRAVENOUS

## 2024-03-24 MED ORDER — SODIUM CHLORIDE 0.9% FLUSH
3.0000 mL | Freq: Two times a day (BID) | INTRAVENOUS | Status: DC
  Administered 2024-03-24 – 2024-03-31 (×13): 3 mL via INTRAVENOUS

## 2024-03-24 MED ORDER — ALBUTEROL SULFATE (2.5 MG/3ML) 0.083% IN NEBU: 2.5000 mg | INHALATION_SOLUTION | RESPIRATORY_TRACT | Status: DC | PRN

## 2024-03-24 MED ORDER — MUPIROCIN 2 % EX OINT
TOPICAL_OINTMENT | Freq: Two times a day (BID) | CUTANEOUS | Status: DC
  Administered 2024-03-24 – 2024-03-25 (×2): 1 via NASAL
  Filled 2024-03-24: qty 22

## 2024-03-24 MED ORDER — SODIUM CHLORIDE 0.9% FLUSH: 3.0000 mL | INTRAVENOUS | Status: DC | PRN

## 2024-03-24 MED ORDER — ACETAMINOPHEN 650 MG RE SUPP: 650.0000 mg | Freq: Four times a day (QID) | RECTAL | Status: DC | PRN

## 2024-03-24 MED ORDER — ONDANSETRON HCL 4 MG/2ML IJ SOLN: 4.0000 mg | Freq: Four times a day (QID) | INTRAMUSCULAR | Status: DC | PRN

## 2024-03-24 NOTE — Plan of Care (Signed)
   Problem: Coping: Goal: Level of anxiety will decrease Outcome: Progressing   Problem: Pain Managment: Goal: General experience of comfort will improve and/or be controlled Outcome: Progressing

## 2024-03-24 NOTE — Progress Notes (Signed)
 Notified Dr. Pearlean fo positive MRSA lab of the nares.

## 2024-03-24 NOTE — ED Provider Notes (Signed)
 " Jay Campbell   CSN: [id number] Arrival date & time: 03/24/24  9349     Patient presents with: Code Sepsis, Altered Mental Status, and Shortness of Breath   Jay Campbell is a 79 y.o. male.   Pt is a 79 yo male with pmhx significant for dementia, CAD, afib (on Eliquis ), gout, anxiety, smoldering multiple myeloma.  Pt is a resident of a SNF and was brought in by EMS with emergency traffic for resp distress.  Pt's O2 sats were in the 60s on RA.  He was put on a 10L NRB mask and O2 sats went up to the mid to upper 90s.  He had a fever at the SNF and was given tylenol  (unkn time/dose).  EMS gave him 700 cc NS en route plus 1 g rocephin .  He is unable to contribute to hx.       Prior to Admission medications  Medication Sig Start Date End Date Taking? Authorizing Provider  allopurinol  (ZYLOPRIM ) 100 MG tablet Take 100 mg by mouth Daily at 5am. 12/15/11   [provider]  ARIPiprazole (ABILIFY) 5 MG tablet Take 5 mg by mouth daily. 11/05/23   [provider]  ascorbic acid (VITAMIN C) 500 MG tablet Take 500 mg by mouth daily.    [provider]  BD INSULIN  SYRINGE U/F 31G X 5/16 0.3 ML MISC  06/26/19   [provider]  Calcium Carb-Cholecalciferol (OYSTER SHELL CALCIUM W/D) 500-5 MG-MCG TABS Take 1 tablet by mouth.    [provider]  cholecalciferol (VITAMIN D3) 25 MCG (1000 UNIT) tablet Take 2,000 Units by mouth daily.    [provider]  colchicine  0.6 MG tablet Take 0.6 mg by mouth as needed.    [provider]  Continuous Blood Gluc Sensor (FREESTYLE LIBRE 14 DAY SENSOR) MISC  06/02/21   [provider]  diphenhydrAMINE-APAP, sleep, (TYLENOL  PM EXTRA STRENGTH) 50-1000 MG/30ML LIQD Take 1,000 mLs by mouth daily.    [provider]  divalproex (DEPAKOTE SPRINKLE) 125 MG capsule Take 125 mg by mouth.    [provider]  donepezil  (ARICEPT ) 10  MG tablet Take 1 tablet (10 mg total) by mouth at bedtime. 01/13/22   Whitfield Raisin, NP  doxycycline  (VIBRAMYCIN ) 100 MG capsule Take 1 capsule (100 mg total) by mouth 2 (two) times daily. 12/13/23   Dean Clarity, MD  ELIQUIS  2.5 MG TABS tablet Take 2.5 mg by mouth 2 (two) times daily. 01/13/22   [provider]  famotidine (PEPCID) 20 MG tablet Take 20 mg by mouth 2 (two) times daily. 07/05/19   [provider]  ferrous sulfate  325 (65 FE) MG EC tablet Take 325 mg by mouth daily with breakfast.    [provider]  glipiZIDE (GLUCOTROL) 5 MG tablet Take 5 mg by mouth daily. 01/13/22   [provider]  HUMULIN 70/30 (70-30) 100 UNIT/ML injection Inject into the skin. 05/25/21   [provider]  HYDROcodone -acetaminophen  (NORCO/VICODIN) 5-325 MG tablet Take 0.5 tablets by mouth daily. 10/10/22   [provider]  hydrOXYzine (ATARAX) 10 MG tablet Take 10 mg by mouth 3 (three) times daily as needed for nausea.    [provider]  isosorbide  mononitrate (IMDUR ) 60 MG 24 hr tablet Take 1 tablet (60 mg total) by mouth daily. 04/17/20   Kelsie Agent, MD  latanoprost  (XALATAN ) 0.005 % ophthalmic solution  02/23/21   [provider]  lidocaine  (LIDODERM )  5 % Place 1 patch onto the skin daily. Remove & Discard patch within 12 hours or as directed by MD 02/07/23   Henderly, Britni A, PA-C  lisinopril (ZESTRIL) 10 MG tablet Take 10 mg by mouth every evening. 12/10/18   [provider]  LORazepam (ATIVAN) 0.5 MG tablet Take 0.25 mg by mouth every 6 (six) hours as needed. 11/23/22   [provider]  LUMIGAN 0.01 % SOLN Place 1 drop into both eyes. 09/16/22   [provider]  meclizine (ANTIVERT) 25 MG tablet Take 25 mg by mouth 3 (three) times daily as needed for dizziness.    [provider]  memantine  (NAMENDA ) 10 MG tablet TAKE 1 TABLET BY MOUTH TWICE  DAILY 11/02/21   Whitfield Raisin, NP  Menthol, Topical  Analgesic, (BIOFREEZE) 4 % GEL Apply topically as needed.    [provider]  metFORMIN (GLUCOPHAGE) 1000 MG tablet Take 1,000 mg by mouth 2 (two) times daily with a meal.    [provider]  metoprolol  tartrate (LOPRESSOR ) 25 MG tablet Take 25 mg by mouth.    [provider]  nitroGLYCERIN  (NITROSTAT ) 0.4 MG SL tablet Place 1 tablet (0.4 mg total) under the tongue every 5 (five) minutes x 3 doses as needed. 04/15/16   Kelsie Agent, MD  [facility] VERIO test strip  08/17/17   [provider]  oxyCODONE -acetaminophen  (PERCOCET/ROXICET) 5-325 MG tablet Take 1 tablet by mouth every 6 (six) hours as needed for severe pain (pain score 7-10). 02/07/23   Henderly, Britni A, PA-C  sertraline  (ZOLOFT ) 100 MG tablet Take 125 mg by mouth daily. 04/19/19   [provider]  simvastatin  (ZOCOR ) 40 MG tablet Take 40 mg by mouth at bedtime.    [provider]  tamsulosin  (FLOMAX ) 0.4 MG CAPS capsule Take 0.4 mg by mouth daily. 09/03/20   [provider]    Allergies: Contrast media [iodinated contrast media], Vioxx [rofecoxib], Shellfish allergy, and Sulfonamide derivatives    Review of Systems  Unable to perform ROS: Mental status change  All other systems reviewed and are negative.   Updated Vital Signs BP (!) 118/59   Pulse 65   Temp (!) 102.1 F (38.9 C) (Rectal)   Resp (!) 30   Ht 5' 5 (1.651 m)   Wt 79 kg   SpO2 95%   BMI 28.98 kg/m   Physical Exam Vitals and nursing Campbell reviewed.  Constitutional:      General: He is in acute distress.     Appearance: He is ill-appearing.  HENT:     Head: Normocephalic and atraumatic.     Mouth/Throat:     Mouth: Mucous membranes are dry.  Eyes:     Pupils: Pupils are equal, round, and reactive to light.  Cardiovascular:     Rate and Rhythm: Rhythm irregular.     Comments: paced Pulmonary:     Effort: Respiratory distress present.     Breath sounds: Rhonchi present.  Abdominal:      General: Bowel sounds are normal.     Palpations: Abdomen is soft.  Musculoskeletal:        General: Normal range of motion.     Cervical back: Normal range of motion and neck supple.  Skin:    General: Skin is warm.     Capillary Refill: Capillary refill takes less than 2 seconds.  Neurological:     Comments: Pt is moving all 4 extremities, but is only making groaning noises when stimulated.     (  all labs ordered are listed, but only abnormal results are displayed) Labs Reviewed  LACTIC ACID, PLASMA - Abnormal; Notable for the following components:      Result Value   Lactic Acid, Venous 2.6 (*)    All other components within normal limits  COMPREHENSIVE METABOLIC PANEL WITH GFR - Abnormal; Notable for the following components:   Sodium 155 (*)    Chloride 124 (*)    CO2 17 (*)    Glucose, Bld 239 (*)    BUN 60 (*)    Creatinine, Ser 1.96 (*)    Calcium 7.6 (*)    Total Protein 5.9 (*)    Albumin 2.6 (*)    GFR, Estimated 34 (*)    All other components within normal limits  CBC WITH DIFFERENTIAL/PLATELET - Abnormal; Notable for the following components:   WBC 3.0 (*)    RBC 2.88 (*)    Hemoglobin 9.8 (*)    HCT 30.9 (*)    MCV 107.3 (*)    Platelets 68 (*)    Abs Immature Granulocytes 0.10 (*)    All other components within normal limits  URINALYSIS, W/ REFLEX TO CULTURE (INFECTION SUSPECTED) - Abnormal; Notable for the following components:   Color, Urine AMBER (*)    APPearance CLOUDY (*)    Hgb urine dipstick LARGE (*)    Ketones, ur 5 (*)    Protein, ur 100 (*)    Leukocytes,Ua TRACE (*)    Bacteria, UA RARE (*)    All other components within normal limits  BLOOD GAS, VENOUS - Abnormal; Notable for the following components:   pCO2, Ven 35 (*)    Bicarbonate 15.7 (*)    Acid-base deficit 9.6 (*)    All other components within normal limits  PROTIME-INR - Abnormal; Notable for the following components:   Prothrombin Time 31.8 (*)    INR 2.9 (*)    All other  components within normal limits  RESP PANEL BY RT-PCR (RSV, FLU A&B, COVID)  RVPGX2  CULTURE, BLOOD (ROUTINE X 2)  CULTURE, BLOOD (ROUTINE X 2)  URINE CULTURE  LACTIC ACID, PLASMA    EKG: EKG Interpretation Date/Time:  Sunday March 24 2024 07:11:08 EST Ventricular Rate:  60 PR Interval:    QRS Duration:  158 QT Interval:  462 QTC Calculation: 462 R Axis:   -72  Text Interpretation: Junctional rhythm Nonspecific IVCD with LAD LVH with secondary repolarization abnormality Inferior infarct, acute (RCA) No significant change since last tracing Confirmed by Dean Clarity [phone]) on 03/24/2024 7:45:04 AM  Radiology: [facility] Chest Port 1 View Result Date: 03/24/2024 CLINICAL DATA:  Respiratory distress. EXAM: PORTABLE CHEST 1 VIEW COMPARISON:  03/03/2024 FINDINGS: The cardio pericardial silhouette is enlarged. Retrocardiac atelectasis or infiltrate noted. Right lung clear. Single lead left-sided permanent pacemaker again noted. Telemetry leads overlie the chest. IMPRESSION: Retrocardiac atelectasis or infiltrate. Electronically Signed   By: Camellia Candle M.D.   On: 03/24/2024 07:22     Procedures   Medications Ordered in the ED  azithromycin  (ZITHROMAX ) 500 mg in sodium chloride  0.9 % 250 mL IVPB (500 mg Intravenous New Bag/Given 03/24/24 0738)  methylPREDNISolone  sodium succinate (SOLU-MEDROL ) 40 mg/mL injection 40 mg (has no administration in time range)  ipratropium-albuterol  (DUONEB) 0.5-2.5 (3) MG/3ML nebulizer solution 3 mL (has no administration in time range)  albuterol  (PROVENTIL ) (2.5 MG/3ML) 0.083% nebulizer solution 2.5 mg (has no administration in time range)  cefTRIAXone  (ROCEPHIN ) 2 g in sodium chloride  0.9 % 100 mL IVPB (  has no administration in time range)  doxycycline  (VIBRAMYCIN ) 100 mg in sodium chloride  0.9 % 250 mL IVPB (has no administration in time range)  0.9 %  sodium chloride  infusion (has no administration in time range)  dextromethorphan -guaiFENesin  (MUCINEX  DM)  30-600 MG per 12 hr tablet 1 tablet (has no administration in time range)  allopurinol  (ZYLOPRIM ) tablet 100 mg (has no administration in time range)  metoprolol  tartrate (LOPRESSOR ) tablet 25 mg (has no administration in time range)  pantoprazole  (PROTONIX ) EC tablet 40 mg (has no administration in time range)  tamsulosin  (FLOMAX ) capsule 0.4 mg (has no administration in time range)  apixaban  (ELIQUIS ) tablet 2.5 mg (has no administration in time range)  ferrous sulfate  EC tablet 325 mg (has no administration in time range)  latanoprost  (XALATAN ) 0.005 % ophthalmic solution 1 drop (has no administration in time range)  sodium chloride  flush (NS) 0.9 % injection 3 mL (has no administration in time range)  sodium chloride  flush (NS) 0.9 % injection 3 mL (has no administration in time range)  sodium chloride  flush (NS) 0.9 % injection 3 mL (has no administration in time range)  0.9 %  sodium chloride  infusion (has no administration in time range)  acetaminophen  (TYLENOL ) tablet 650 mg (has no administration in time range)    Or  acetaminophen  (TYLENOL ) suppository 650 mg (has no administration in time range)  traZODone  (DESYREL ) tablet 50 mg (has no administration in time range)  polyethylene glycol (MIRALAX  / GLYCOLAX ) packet 17 g (has no administration in time range)  bisacodyl  (DULCOLAX) suppository 10 mg (has no administration in time range)  ondansetron  (ZOFRAN ) tablet 4 mg (has no administration in time range)    Or  ondansetron  (ZOFRAN ) injection 4 mg (has no administration in time range)  sodium chloride  0.9 % bolus 1,000 mL (0 mLs Intravenous Stopped 03/24/24 0748)    And  sodium chloride  0.9 % bolus 1,000 mL (0 mLs Intravenous Stopped 03/24/24 0800)    And  sodium chloride  0.9 % bolus 500 mL (500 mLs Intravenous New Bag/Given 03/24/24 0810)  cefTRIAXone  (ROCEPHIN ) 1 g in sodium chloride  0.9 % 100 mL IVPB (0 g Intravenous Stopped 03/24/24 0801)  aspirin  suppository 300 mg (300 mg Rectal  Given 03/24/24 0739)                                    Medical Decision Making Amount and/or Complexity of Data Reviewed Labs: ordered. Radiology: ordered.  Risk OTC drugs. Decision regarding hospitalization.   This patient presents to the ED for concern of ams, this involves an extensive number of treatment options, and is a complaint that carries with it a high risk of complications and morbidity.  The differential diagnosis includes sepsis, electrolyte abn, infection, anemia   Co morbidities that complicate the patient evaluation  dementia, CAD, afib (on Eliquis ), gout, anxiety, smoldering multiple myeloma   Additional history obtained:  Additional history obtained from epic chart review External records from outside source obtained and reviewed including EMS report/son   Lab Tests:  I Ordered, and personally interpreted labs.  The pertinent results include:  VBG with pH 7.27; cmp with na elevated at 155, CO2 low at 17, glucose elevated at 239, bun elevated at 60, cr elevated at 1.96 (bun 30 and cr 1.55 and Na 141 on 12/21); cbc with pancytopenia.  Wbc low at 3, hgb low at 9.8, plt low at 68 (stable); lactic  elevated at 2.6; inr elevated at 2.9; ua + hgb, ketones, protein; no uti   Imaging Studies ordered:  I ordered imaging studies including cxr  I independently visualized and interpreted imaging which showed Retrocardiac atelectasis or infiltrate.  I agree with the radiologist interpretation   Cardiac Monitoring:  The patient was maintained on a cardiac monitor.  I personally viewed and interpreted the cardiac monitored which showed an underlying rhythm of: afib   Medicines ordered and prescription drug management:  I ordered medication including ivfs/rocephin /zithromax   for sepsis  Reevaluation of the patient after these medicines showed that the patient improved I have reviewed the patients home medicines and have made adjustments as needed   Test  Considered:  ct   Critical Interventions:  Oxygen , abx   Consultations Obtained:  I requested consultation with the hospitalist (Dr. KYM Carwin),  and discussed lab and imaging findings as well as pertinent plan -he will admit   Problem List / ED Course:  Sepsis:  code sepsis called.  Pt given sepsis fluids and iv rocephin /zithromax  for pna (EMS gave 1 g rocephin  pta, so pt given an additional 1g here).  Bp stable. Hypernatremia and aki:  likely due to dehydration.  Ivfs given. Pancytopenia:  chronic from smoldering MM Resp failure:  pt on 10L oxygen  and O2 sats are in the mid-90s. DNR/DNI:  verified with son.   Reevaluation:  After the interventions noted above, I reevaluated the patient and found that they have :improved   Social Determinants of Health:  Lives in SNF   Dispostion:  After consideration of the diagnostic results and the patients response to treatment, I feel that the patent would benefit from admission.  CRITICAL CARE Performed by: Mliss Boyers   Total critical care time: 45 minutes  Critical care time was exclusive of separately billable procedures and treating other patients.  Critical care was necessary to treat or prevent imminent or life-threatening deterioration.  Critical care was time spent personally by me on the following activities: development of treatment plan with patient and/or surrogate as well as nursing, discussions with consultants, evaluation of patient's response to treatment, examination of patient, obtaining history from patient or surrogate, ordering and performing treatments and interventions, ordering and review of laboratory studies, ordering and review of radiographic studies, pulse oximetry and re-evaluation of patient's condition.        Final diagnoses:  Sepsis with acute hypoxic respiratory failure without septic shock, due to unspecified organism [facility])  Acute metabolic encephalopathy  Hypernatremia  AKI (acute  kidney injury)  Dehydration  Pancytopenia [facility])    ED Discharge Orders     None          Boyers Mliss, MD 03/24/24 [phone]  "

## 2024-03-24 NOTE — Consult Note (Addendum)
 WOC Nurse Consult Note: Reason for Consult: wound  heel Ulcer in DM patient  Wound type: Deep Tissue Pressure Injury Pressure Injury POA: Yes Measurement: see nursing flow sheets Wound bed:50% pink centrally; 50% purple  Drainage (amount, consistency, odor) see nursing flow sheets Periwound:see nursing flow sheets Dressing procedure/placement/frequency: Cleanse heel wound with saline, pat dry Cover entire area with single layer of xeroform and top with foam dressing Offload at all times with prevalon boot   Re consult if needed, will not follow at this time. Thanks  [employer], RN,CWOCN, CNS, [employer] [phone]

## 2024-03-24 NOTE — Plan of Care (Signed)

## 2024-03-24 NOTE — H&P (Addendum)
 " History and Physical   Patient: Jay Campbell FMW:[id number] DOB: 28-Jun-1945 DOA: 03/24/2024 DOS: the patient was seen and examined on 03/24/2024 PCP: [name], MD  Patient coming from: SNF  Chief Complaint:  Chief Complaint  Patient presents with   Code Sepsis   Altered Mental Status   Shortness of Breath   HPI: Jay Campbell is a 79 y.o. male with medical history significant  for dementia, CAD, afib (on Eliquis ), gout, anxiety, smoldering multiple myeloma, history of sick sinus syndrome/heart block with status post pacemaker placement, DM2 and HTN presents by EMS from SNF with altered mentation (Pt bib EMS after staff called for Respiratory distress. Staff reported to EMS that pt's O2 sats on R/A were in the 60's and that pt was more altered than usual. EMS states that they placed pt on 10L NRB and sats improved to 94-96%. Temp per EMS was 102.6 axillary and pt was given 1gm Rocephin  as well as approximately 700ml NS) - Patient is a poor historian due to dementia and altered mentation - Reported no emesis - Apparently patient had recent influenza infection - COVID and influenza RSV negative today - MRSA PCR positive - UA suspicious for UTI - WBC 3.0 similar to prior, patient with history of leukopenia -Lactic acid is 2.6,  repeat is still  2.6 - Hemoglobin 9.8 similar to prior patient has history of chronic anemia - Platelets 68 patient has history of chronic thrombocytopenia - Creatinine is 1.96, baseline creatinine usually 1.4-1.6 -BUN elevated at 60 baseline usually around 30 - Bicarb is 17, anion gap is 14 - Sodium 155, LFTs not elevated - Chest x-ray with retrocardiac infiltrate.   Review of Systems: unable to review all systems due to the inability of the patient to answer questions. Past Medical History:  Diagnosis Date   Alzheimer's dementia (HCC)    Anxiety    Atrial fibrillation (HCC)    Permanent   Coronary artery disease    Mild nonobstructive 1/12    DM (diabetes mellitus) (HCC)    GERD (gastroesophageal reflux disease)    Gout    Hearing disorder, cochlear    Hiatal hernia    HLD (hyperlipidemia)    HTN (hypertension)    S/P AV nodal ablation    s/p SJM PPM; gen change 02-01-13 by Dr Kelsie   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   Warfarin anticoagulation    Past Surgical History:  Procedure Laterality Date   CATARACT EXTRACTION Right    COCHLEAR IMPLANT Right    HEMORRHOID BANDING     KNEE ARTHROSCOPY Right 1992   Dr Brenna    PACEMAKER GENERATOR CHANGE Bilateral 02/01/2013   Procedure: PACEMAKER GENERATOR CHANGE;  Surgeon: Lynwood JONETTA Kelsie, MD;  Location[facility] CATH LAB;  Service: Cardiovascular;  Laterality: Bilateral;   PACEMAKER PLACEMENT  2002; 2014   SJM implanted by Dr Waddell with AV nodal ablation performed; gen change to [employer] SR RF by Dr Kelsie 02-01-13   RETINAL LASER PROCEDURE Left    SLT LASER APPLICATION Left 12/14/2015   Procedure: SLT LASER APPLICATION;  Surgeon: Dow JULIANNA Burke, MD;  Location: [facility];  Service: Ophthalmology;  Laterality: Left;   Social History:  reports that he has never smoked. He has never used smokeless tobacco. He reports that he does not drink alcohol and does not use drugs.  Allergies[1]  Family History  Problem Relation Age of Onset   COPD Mother    Emphysema Mother    Cancer - Lung  Father    Heart disease Paternal Uncle    Stomach cancer Neg Hx    Colon cancer Neg Hx    Prior to Admission medications  Medication Sig Start Date End Date Taking? Authorizing Provider  allopurinol  (ZYLOPRIM ) 100 MG tablet Take 100 mg by mouth Daily at 5am. 12/15/11   [provider]  ARIPiprazole (ABILIFY) 5 MG tablet Take 5 mg by mouth daily. 11/05/23   [provider]  ascorbic acid (VITAMIN C) 500 MG tablet Take 500 mg by mouth daily.    [provider]  BD INSULIN  SYRINGE U/F 31G X 5/16 0.3 ML MISC  06/26/19   [provider]  Calcium Carb-Cholecalciferol  (OYSTER SHELL CALCIUM W/D) 500-5 MG-MCG TABS Take 1 tablet by mouth.    [provider]  cholecalciferol (VITAMIN D3) 25 MCG (1000 UNIT) tablet Take 2,000 Units by mouth daily.    [provider]  colchicine  0.6 MG tablet Take 0.6 mg by mouth as needed.    [provider]  Continuous Blood Gluc Sensor (FREESTYLE LIBRE 14 DAY SENSOR) MISC  06/02/21   [provider]  diphenhydrAMINE-APAP, sleep, (TYLENOL  PM EXTRA STRENGTH) 50-1000 MG/30ML LIQD Take 1,000 mLs by mouth daily.    [provider]  divalproex (DEPAKOTE SPRINKLE) 125 MG capsule Take 125 mg by mouth.    [provider]  donepezil  (ARICEPT ) 10 MG tablet Take 1 tablet (10 mg total) by mouth at bedtime. 01/13/22   Whitfield Raisin, NP  doxycycline  (VIBRAMYCIN ) 100 MG capsule Take 1 capsule (100 mg total) by mouth 2 (two) times daily. 12/13/23   Dean Clarity, MD  ELIQUIS  2.5 MG TABS tablet Take 2.5 mg by mouth 2 (two) times daily. 01/13/22   [provider]  famotidine (PEPCID) 20 MG tablet Take 20 mg by mouth 2 (two) times daily. 07/05/19   [provider]  ferrous sulfate  325 (65 FE) MG EC tablet Take 325 mg by mouth daily with breakfast.    [provider]  glipiZIDE (GLUCOTROL) 5 MG tablet Take 5 mg by mouth daily. 01/13/22   [provider]  HUMULIN 70/30 (70-30) 100 UNIT/ML injection Inject into the skin. 05/25/21   [provider]  HYDROcodone -acetaminophen  (NORCO/VICODIN) 5-325 MG tablet Take 0.5 tablets by mouth daily. 10/10/22   [provider]  hydrOXYzine (ATARAX) 10 MG tablet Take 10 mg by mouth 3 (three) times daily as needed for nausea.    [provider]  isosorbide  mononitrate (IMDUR ) 60 MG 24 hr tablet Take 1 tablet (60 mg total) by mouth daily. 04/17/20   Allred, Lynwood, MD  latanoprost  (XALATAN ) 0.005 % ophthalmic solution  02/23/21   [provider]  lidocaine  (LIDODERM ) 5 % Place 1 patch onto the skin daily.  Remove & Discard patch within 12 hours or as directed by MD 02/07/23   Henderly, Britni A, PA-C  lisinopril (ZESTRIL) 10 MG tablet Take 10 mg by mouth every evening. 12/10/18   [provider]  LORazepam (ATIVAN) 0.5 MG tablet Take 0.25 mg by mouth every 6 (six) hours as needed. 11/23/22   [provider]  LUMIGAN 0.01 % SOLN Place 1 drop into both eyes. 09/16/22   [provider]  meclizine (ANTIVERT) 25 MG tablet Take 25 mg by mouth 3 (three) times daily as needed for dizziness.    [provider]  memantine  (NAMENDA ) 10 MG tablet TAKE 1 TABLET BY MOUTH TWICE  DAILY 11/02/21   Whitfield Raisin, NP  Menthol, Topical  Analgesic, (BIOFREEZE) 4 % GEL Apply topically as needed.    [provider]  metFORMIN (GLUCOPHAGE) 1000 MG tablet Take 1,000 mg by mouth 2 (two) times daily with a meal.    [provider]  metoprolol  tartrate (LOPRESSOR ) 25 MG tablet Take 25 mg by mouth.    [provider]  nitroGLYCERIN  (NITROSTAT ) 0.4 MG SL tablet Place 1 tablet (0.4 mg total) under the tongue every 5 (five) minutes x 3 doses as needed. 04/15/16   Kelsie Agent, MD  [facility] VERIO test strip  08/17/17   [provider]  oxyCODONE -acetaminophen  (PERCOCET/ROXICET) 5-325 MG tablet Take 1 tablet by mouth every 6 (six) hours as needed for severe pain (pain score 7-10). 02/07/23   Henderly, Britni A, PA-C  sertraline  (ZOLOFT ) 100 MG tablet Take 125 mg by mouth daily. 04/19/19   [provider]  simvastatin  (ZOCOR ) 40 MG tablet Take 40 mg by mouth at bedtime.    [provider]  tamsulosin  (FLOMAX ) 0.4 MG CAPS capsule Take 0.4 mg by mouth daily. 09/03/20   [provider]    Physical Exam: Vitals:   03/24/24 1620 03/24/24 1700 03/24/24 1800 03/24/24 1854  BP:  (!) 136/46 135/74   Pulse:  63 61   Resp:  (!) 21    Temp: 98 F (36.7 C)     TempSrc: Axillary     SpO2:  100% 100% 100%  Weight:      Height:        Physical  Exam Gen:-Very lethargic, not really communicative HEENT:- [place].AT, No sclera icterus Nose--[place] 4L/min Neck-Supple Neck,No JVD,.  Lungs-diminished breath sounds, bibasilar rhonchi CV- S1, S2 normal, RRR Abd-  +ve B.Sounds, Abd Soft, No tenderness,    Extremity/Skin:- No  edema,   good pedal pulses , left heel decubitus ulcer please see photos in epic Psych-=-lethargic and nonverbal Neuro-lethargic, not communicative , no tremors - MSK-- left heel decubitus ulcer please see photos in epic Media Information   Document Information  Photographic Image: Photos  Left heal  03/24/2024 19:48  Attached To:  Hospital Encounter on 03/24/24  Source Information  Beverley Annabella SAILOR, RN  [facility]   Data Reviewed: - Apparently patient had recent influenza infection - COVID and influenza RSV negative today - MRSA PCR positive - UA suspicious for UTI - WBC 3.0 similar to prior, patient with history of leukopenia -Lactic acid is 2.6,  repeat is still  2.6 - Hemoglobin 9.8 similar to prior patient has history of chronic anemia - Platelets 68 patient has history of chronic thrombocytopenia - Creatinine is 1.96, baseline creatinine usually 1.4-1.6 -BUN elevated at 60 baseline usually around 30 - Bicarb is 17, anion gap is 14 - Sodium 155, LFTs not elevated - Chest x-ray with retrocardiac infiltrate.  Assessment and Plan: 1)Sepsis secondary to pneumonia--CAP---POA - Chest x-ray with retrocardiac infiltrate---post influenza pneumonia --COVID flu and RSV negative today -WBC 3.0 similar to prior, patient with history of leukopenia -Lactic acid is 2.6,  repeat is still  2.6 --- Empiric treatment with IV Rocephin , doxycycline , mucolytics and bronchodilators  2)Acute hypoxic respiratory failure--- initially required nonrebreather bag - Currently weaned down to 5 L of oxygen  via nasal cannula -- Hypoxia should improve with management of #1 above  3)Possible UTI--- IV Rocephin  as above #1  pending culture data  4)Dehydration and hypernatremia--- --elevated at 155 BUN/creatinine ratio greater than 20 -- Suspect prerenal azotemia and dehydration in setting of altered mentation with poor oral intake -- IV  fluids as ordered - Serial chemistries  5)AKI----acute kidney injury on CKD stage -3B - Creatinine is 1.96, baseline creatinine usually 1.4-1.6 -BUN elevated at 60 baseline usually around 30 --Hold lisinopril and metformin -renally adjust medications, avoid nephrotoxic agents / dehydration  / hypotension  6)Acute metabolic encephalopathy--- due to #1, #2 #3 and #4 above --Superimposed on underlying dementia -Hold Depakote, hold Zoloft , hold Actos, hold Atarax, hold lorazepam, hold Aricept  -- Manage as above  7)chronic pancytopenia--- in the setting of smoldering multiple myeloma WBC 3.0 similar to prior, patient with history of leukopenia - Hemoglobin 9.8 similar to prior patient has history of chronic anemia - Platelets 68 patient has history of chronic thrombocytopenia -- No bleeding concerns monitor closely especially while on apixaban   8)Afib/ history of sick sinus syndrome/heart block with status post pacemaker placement -Continue metoprolol  for rate control - Discontinue  Eliquis  given thrombocytopenia, especially in the setting of renal dysfunction with increased risk of bleeding from Eliquis   9)DM2--A1c 7.3 reflecting uncontrolled DM with hyperglycemia PTA --Hold 70/30 insulin , hold metformin and glipizide -Use Novolog /Humalog Sliding scale insulin  with Accu-Cheks/Fingersticks as ordered   10)GERD--continue Protonix   11) BPH with LUTs--continue Flomax   12)Lt Heel Pressure Injury--POA -- Wound care consult requested   Advance Care Planning:   Code Status: Limited: Do not attempt resuscitation (DNR) -DNR-LIMITED -Do Not Intubate/DNI    Severity of Illness: The appropriate patient status for this patient is INPATIENT. Inpatient status is judged to be  reasonable and necessary in order to provide the required intensity of service to ensure the patient's safety. The patient's presenting symptoms, physical exam findings, and initial radiographic and laboratory data in the context of their chronic comorbidities is felt to place them at high risk for further clinical deterioration. Furthermore, it is not anticipated that the patient will be medically stable for discharge from the hospital within 2 midnights of admission.   * I certify that at the point of admission it is my clinical judgment that the patient will require inpatient hospital care spanning beyond 2 midnights from the point of admission due to high intensity of service, high risk for further deterioration and high frequency of surveillance required.*  Author: Rendall Carwin, MD 03/24/2024 7:13 PM  For on call review [email or web address].      [1]  Allergies Allergen Reactions   Contrast Media [Iodinated Contrast Media]    Vioxx [Rofecoxib]     On pt MAR   Shellfish Allergy Itching and Rash   Sulfonamide Derivatives Itching and Rash   "

## 2024-03-24 NOTE — ED Triage Notes (Signed)
 Pt bib EMS after staff called for Respiratory distress. Staff reported to EMS that pt's O2 sats on R/A were in the 60's and that pt was more altered than usual. EMS states that they placed pt on 10L NRB and sats improved to 94-96%. Temp per EMS was 102.6 axillary and pt was given 1gm Rocephin  as well as approximately 700ml NS. CBG was 298. EDP at bedside.

## 2024-03-24 NOTE — Sepsis Progress Note (Signed)
 Elink following code sepsis

## 2024-03-25 DIAGNOSIS — J9601 Acute respiratory failure with hypoxia: Secondary | ICD-10-CM | POA: Diagnosis not present

## 2024-03-25 LAB — BASIC METABOLIC PANEL WITH GFR
Anion gap: 11 (ref 5–15)
BUN: 62 mg/dL — ABNORMAL HIGH (ref 8–23)
CO2: 18 mmol/L — ABNORMAL LOW (ref 22–32)
Calcium: 7.8 mg/dL — ABNORMAL LOW (ref 8.9–10.3)
Chloride: 127 mmol/L — ABNORMAL HIGH (ref 98–111)
Creatinine, Ser: 1.65 mg/dL — ABNORMAL HIGH (ref 0.61–1.24)
GFR, Estimated: 42 mL/min — ABNORMAL LOW
Glucose, Bld: 337 mg/dL — ABNORMAL HIGH (ref 70–99)
Potassium: 4.5 mmol/L (ref 3.5–5.1)
Sodium: 156 mmol/L — ABNORMAL HIGH (ref 135–145)

## 2024-03-25 LAB — RENAL FUNCTION PANEL
Albumin: 2.7 g/dL — ABNORMAL LOW (ref 3.5–5.0)
Anion gap: 13 (ref 5–15)
BUN: 69 mg/dL — ABNORMAL HIGH (ref 8–23)
CO2: 15 mmol/L — ABNORMAL LOW (ref 22–32)
Calcium: 8.1 mg/dL — ABNORMAL LOW (ref 8.9–10.3)
Chloride: 129 mmol/L — ABNORMAL HIGH (ref 98–111)
Creatinine, Ser: 1.77 mg/dL — ABNORMAL HIGH (ref 0.61–1.24)
GFR, Estimated: 38 mL/min — ABNORMAL LOW
Glucose, Bld: 289 mg/dL — ABNORMAL HIGH (ref 70–99)
Phosphorus: 2.3 mg/dL — ABNORMAL LOW (ref 2.5–4.6)
Potassium: 4.1 mmol/L (ref 3.5–5.1)
Sodium: 157 mmol/L — ABNORMAL HIGH (ref 135–145)

## 2024-03-25 LAB — GLUCOSE, CAPILLARY
Glucose-Capillary: 349 mg/dL — ABNORMAL HIGH (ref 70–99)
Glucose-Capillary: 275 mg/dL — ABNORMAL HIGH (ref 70–99)
Glucose-Capillary: 253 mg/dL — ABNORMAL HIGH (ref 70–99)
Glucose-Capillary: 238 mg/dL — ABNORMAL HIGH (ref 70–99)

## 2024-03-25 LAB — LACTIC ACID, PLASMA: Lactic Acid, Venous: 2.5 mmol/L (ref 0.5–1.9)

## 2024-03-25 LAB — MAGNESIUM: Magnesium: 1.8 mg/dL (ref 1.7–2.4)

## 2024-03-25 MED ORDER — SODIUM CHLORIDE 0.45 % IV SOLN: INTRAVENOUS | Status: DC

## 2024-03-25 MED ORDER — SODIUM CHLORIDE 0.9 % IV BOLUS
1000.0000 mL | Freq: Once | INTRAVENOUS | Status: AC
  Administered 2024-03-25: 1000 mL via INTRAVENOUS

## 2024-03-25 MED ORDER — STERILE WATER FOR INJECTION IV SOLN: INTRAVENOUS | Status: DC

## 2024-03-25 MED ORDER — SODIUM CHLORIDE 0.9 % IV SOLN: INTRAVENOUS | Status: DC

## 2024-03-25 MED ORDER — STERILE WATER FOR INJECTION IV SOLN
INTRAVENOUS | Status: DC
  Filled 2024-03-25 (×2): qty 1000

## 2024-03-25 NOTE — Evaluation (Signed)
 Clinical/Bedside Swallow Evaluation Patient Details  Name: Jay Campbell MRN: [id number] Date of Birth: March 12, 1946  Today's Date: 03/25/2024 Time: SLP Start Time (ACUTE ONLY): 1255 SLP Stop Time (ACUTE ONLY): 1318 SLP Time Calculation (min) (ACUTE ONLY): 23 min  Past Medical History:  Past Medical History:  Diagnosis Date   Alzheimer's dementia (HCC)    Anxiety    Atrial fibrillation (HCC)    Permanent   Coronary artery disease    Mild nonobstructive 1/12   DM (diabetes mellitus) (HCC)    GERD (gastroesophageal reflux disease)    Gout    Hearing disorder, cochlear    Hiatal hernia    HLD (hyperlipidemia)    HTN (hypertension)    S/P AV nodal ablation    s/p SJM PPM; gen change 02-01-13 by Dr Campbell   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   Warfarin anticoagulation    Past Surgical History:  Past Surgical History:  Procedure Laterality Date   CATARACT EXTRACTION Right    COCHLEAR IMPLANT Right    HEMORRHOID BANDING     KNEE ARTHROSCOPY Right 1992   Dr Brenna    PACEMAKER GENERATOR CHANGE Bilateral 02/01/2013   Procedure: PACEMAKER GENERATOR CHANGE;  Surgeon: Jay JONETTA Kelsie, MD;  Location: [facility] CATH LAB;  Service: Cardiovascular;  Laterality: Bilateral;   PACEMAKER PLACEMENT  2002; 2014   SJM implanted by [name] with AV nodal ablation performed; gen change to [employer] SR RF by Dr Campbell 02-01-13   RETINAL LASER PROCEDURE Left    SLT LASER APPLICATION Left 12/14/2015   Procedure: SLT LASER APPLICATION;  Surgeon: Jay JULIANNA Burke, MD;  Location: [facility];  Service: Ophthalmology;  Laterality: Left;   HPI:  Jay Campbell is a 79 y.o. male with medical history significant  for dementia, CAD, afib (on Eliquis ), gout, anxiety, smoldering multiple myeloma, history of sick sinus syndrome/heart block with status post pacemaker placement, DM2 and HTN presents by EMS from SNF with altered mentation (Pt bib EMS after staff called for Respiratory distress. Staff reported to EMS  that pt's O2 sats on R/A were in the 60's and that pt was more altered than usual. EMS states that they placed pt on 10L NRB and sats improved to 94-96%. Temp per EMS was 102.6 axillary and pt was given 1gm Rocephin  as well as approximately 700ml NS). Chest xray shows: Retrocardiac atelectasis or infiltrate. Pt recently had the flu. He is a long term care resident at [facility]. BSE requested.    Assessment / Plan / Recommendation  Clinical Impression  Clinical swallow evaluation completed at bedside. Pt roused, but kept his eyes closed throughout the visit. Oral care completed, Pt with some dried secretions and dentition in poor repair. No gross facial asymmetry, but Pt with generalized oral weakness. He was assessed with ice chips, thin water  via tsp/straw, HTL, and puree. Pt with reduced oral awareness and labial closure resulting in labial spillage, oral holding, and oral residuals. Pt with overt and immediate coughing after straw sips thin water  (managed iced chips initially) and delayed coughing with HTL. Pt required coaxing with puree trials and did eventually clear, but overall alertness reduced for safe po intake. Recommend NPO except for ice chips after oral care and ok for PO medication crushed as able in puree when Pt is alert anda upright. SLP will follow during acute stay. Above to RN.   SLP Visit Diagnosis: Dysphagia, unspecified (R13.10)    Aspiration Risk  Moderate aspiration risk;Risk for inadequate nutrition/hydration  Diet Recommendation           Other Recommendations Caregiver Recommendations: Have oral suction available     Swallow Evaluation Recommendations Recommendations: NPO except meds;Ice chips PRN after oral care Medication Administration: Crushed with puree Supervision: Staff to assist with self-feeding;Full assist for feeding Swallowing strategies  : Slow rate;Small bites/sips;Multiple dry swallows after each bite/sip Postural changes: Position pt fully  upright for meals;Stay upright 30-60 min after meals Oral care recommendations: Oral care QID (4x/day);Staff/trained caregiver to provide oral care;Oral care before ice chips/water  Caregiver Recommendations: Have oral suction available   Assistance Recommended at Discharge    Functional Status Assessment Patient has had a recent decline in their functional status and demonstrates the ability to make significant improvements in function in a reasonable and predictable amount of time.  Frequency and Duration min 2x/week  1 week       Prognosis Prognosis for improved oropharyngeal function: Fair Barriers to Reach Goals: Cognitive deficits (lethargy)      Swallow Study   General Date of Onset: 03/24/24 HPI: [name] is a 79 y.o. male with medical history significant  for dementia, CAD, afib (on Eliquis ), gout, anxiety, smoldering multiple myeloma, history of sick sinus syndrome/heart block with status post pacemaker placement, DM2 and HTN presents by EMS from SNF with altered mentation (Pt bib EMS after staff called for Respiratory distress. Staff reported to EMS that pt's O2 sats on R/A were in the 60's and that pt was more altered than usual. EMS states that they placed pt on 10L NRB and sats improved to 94-96%. Temp per EMS was 102.6 axillary and pt was given 1gm Rocephin  as well as approximately 700ml NS). Chest xray shows: Retrocardiac atelectasis or infiltrate. Pt recently had the flu. He is a long term care resident at [facility]. BSE requested. Type of Study: Bedside Swallow Evaluation Previous Swallow Assessment: n/a Diet Prior to this Study: Regular;Thin liquids (Level 0) Temperature Spikes Noted: No Respiratory Status: Room air History of Recent Intubation: No Behavior/Cognition: Lethargic/Drowsy;Cooperative Oral Cavity Assessment: Dried secretions Oral Care Completed by SLP: Yes Oral Cavity - Dentition: Poor condition Vision: Impaired for self-feeding Self-Feeding  Abilities: Total assist Patient Positioning: Upright in bed Baseline Vocal Quality: Low vocal intensity Volitional Cough: Cognitively unable to elicit (strong reflexive cough) Volitional Swallow: Unable to elicit    Oral/Motor/Sensory Function Overall Oral Motor/Sensory Function: Generalized oral weakness   Ice Chips Ice chips: Impaired Presentation: Spoon Oral Phase Impairments: Reduced labial seal Oral Phase Functional Implications: Prolonged oral transit Pharyngeal Phase Impairments: Suspected delayed Swallow   Thin Liquid Thin Liquid: Impaired Presentation: Spoon;Straw Oral Phase Impairments: Reduced labial seal Oral Phase Functional Implications: Oral holding Pharyngeal  Phase Impairments: Suspected delayed Swallow;Decreased hyoid-laryngeal movement;Cough - Immediate    Nectar Thick Nectar Thick Liquid: Not tested   Honey Thick Honey Thick Liquid: Impaired Presentation: Spoon;Straw Oral Phase Functional Implications: Oral holding Pharyngeal Phase Impairments: Suspected delayed Swallow;Cough - Delayed   Puree Puree: Impaired Presentation: Spoon Oral Phase Impairments: Reduced lingual movement/coordination Oral Phase Functional Implications: Oral residue   Solid     Solid: Not tested     Thank you,  Lamar Candy, CCC-SLP [phone]  Marialy Urbanczyk 03/25/2024,1:21 PM

## 2024-03-25 NOTE — Progress Notes (Signed)
 " PROGRESS NOTE  Jay Campbell, is a 79 y.o. male, DOB - 1945-05-15, FMW:[id number]  Admit date - 03/24/2024   Admitting Physician Lynzy Rawles Pearlean, MD  Outpatient Primary MD for the patient is [name], MD  LOS - 1  Chief Complaint  Patient presents with   Code Sepsis   Altered Mental Status   Shortness of Breath      Brief Narrative:  79 y.o. male with medical history significant  for dementia, CAD, afib (on Eliquis ), gout, anxiety, smoldering multiple myeloma, history of sick sinus syndrome/heart block with status post pacemaker placement, DM2 and HTN admitted on 03/24/2018/with sepsis secondary to pneumonia leading to acute hypoxic respiratory failure, dehydration AKI and hypernatremia  -Assessment and Plan: 1)Sepsis secondary to Pneumonia--CAP---POA - Chest x-ray with retrocardiac infiltrate---post influenza pneumonia --COVID flu and RSV negative this admission -WBC 3.0 similar to prior, patient with history of leukopenia -Lactic acid is 2.6 >>2.6 >>3.4 >>2.5 Tmax 102.1, T-current 100.6 ---c/n IV Rocephin , Doxycycline , mucolytics and bronchodilators --Continue aggressive IV fluids   2)Acute hypoxic respiratory failure--- initially required nonrebreather bag - Hypoxia is improving, currently weaned down to 3 L of oxygen  via nasal cannula --IV Solu-Medrol  as ordered -- Hypoxia should improve with management of #1 above   3)Possible UTI--- IV Rocephin  as above #1 pending culture data   4)Dehydration and Hypernatremia---with metabolic acidosis --sodium remains elevated at 156 BUN/creatinine ratio greater than 30 -- Suspect prerenal azotemia and dehydration in setting of altered mentation with poor oral intake -Change IV fluids to bicarb drip - Serial chemistries   5)AKI----acute kidney injury on CKD stage -3B with metabolic acidosis - Creatinine is 1.96, baseline creatinine usually 1.4-1.6 -BUN elevated at 60 baseline usually around 30 --Creatinine and BUN trending down  with hydration --Hold lisinopril and metformin -renally adjust medications, avoid nephrotoxic agents / dehydration  / hypotension   6)Acute metabolic encephalopathy--- due to #1, #2 #3 and #4 above --Superimposed on underlying dementia -Hold Depakote, hold Zoloft , hold Actos, hold Atarax, hold lorazepam, hold Aricept  --Patient is more awake , he is able to take oral intake at this time -- Manage as above   7)Chronic Pancytopenia--- in the setting of smoldering multiple myeloma WBC 3.0 similar to prior, patient with history of leukopenia - Hemoglobin 9.8 similar to prior patient has history of chronic anemia - Platelets 68 patient has history of chronic thrombocytopenia -- -Hold apixaban  due to concerns about increased risk of bleeding -Anticipate that Hgb will drop with IV hydration   8)Afib/ history of sick sinus syndrome/heart block with status post pacemaker placement -Continue metoprolol  for rate control - Discontinue Eliquis  given thrombocytopenia, especially in the setting of renal dysfunction with increased risk of bleeding from Eliquis    9)DM2--A1c 7.3 reflecting uncontrolled DM with hyperglycemia PTA --Hold 70/30 insulin , hold metformin and glipizide -Use Novolog /Humalog Sliding scale insulin  with Accu-Cheks/Fingersticks as ordered    10)GERD--continue Protonix    11)BPH with LUTs--continue Flomax    12)Lt Heel Pressure Injury--POA -- Wound care consult appreciated, recommends Cleanse heel wound with saline, pat dry, Cover entire area with single layer of xeroform and top with foam dressing Offload at all times with prevalon boot  13)Social/Ethics---Discussed with pt's daughter Ms. Delon Delapp--at [phone]--- -Per Daughter he can walk,  on a good day he walks at times from his room to the dinning room with a Roussel/Rolator----at times he uses a wheelchair, rather than ambulating - He often requires one-on-one sitter at [facility] due to  confusion/agitation/behavioral disturbance issues  Status  is: Inpatient  Disposition: The patient is from: SNF              Anticipated d/c is to: SNF              Anticipated d/c date is: 3 days              Patient currently is not medically stable to d/c. Barriers: Not Clinically Stable-   Code Status :  -  Code Status: Limited: Do not attempt resuscitation (DNR) -DNR-LIMITED -Do Not Intubate/DNI    Family Communication:   -Discussed with pt's daughter Ms. Delon Delapp--at [phone]--  DVT Prophylaxis  :   - SCDs   Place and maintain sequential compression device Start: 03/24/24 1940 SCDs Start: 03/24/24 0828 Place TED hose Start: 03/24/24 0828   Lab Results  Component Value Date   PLT 68 (L) 03/24/2024   Inpatient Medications  Scheduled Meds:  allopurinol   100 mg Oral Daily   Chlorhexidine  Gluconate Cloth  6 each Topical Q0600   dextromethorphan -guaiFENesin   1 tablet Oral BID   ferrous sulfate   325 mg Oral Q breakfast   insulin  aspart  0-5 Units Subcutaneous QHS   insulin  aspart  0-9 Units Subcutaneous TID WC   ipratropium-albuterol   3 mL Nebulization TID   latanoprost   1 drop Both Eyes QHS   methylPREDNISolone  (SOLU-MEDROL ) injection  40 mg Intravenous Q12H   metoprolol  tartrate  25 mg Oral BID   mupirocin  ointment   Nasal BID   mouth rinse  15 mL Mouth Rinse 4 times per day   pantoprazole   40 mg Oral Daily   sodium chloride  flush  3 mL Intravenous Q12H   sodium chloride  flush  3 mL Intravenous Q12H   tamsulosin   0.4 mg Oral Daily   Continuous Infusions:  cefTRIAXone  (ROCEPHIN )  IV Stopped (03/25/24 0818)   doxycycline  (VIBRAMYCIN ) IV Stopped (03/25/24 1124)   sodium bicarbonate 150 mEq in sterile water  1,150 mL infusion     PRN Meds:.acetaminophen  **OR** acetaminophen , albuterol , antiseptic oral rinse, bisacodyl , ondansetron  **OR** ondansetron  (ZOFRAN ) IV, mouth rinse, polyethylene glycol, sodium chloride  flush, traZODone    Anti-infectives (From  admission, onward)    Start     Dose/Rate Route Frequency Ordered Stop   03/25/24 1000  doxycycline  (VIBRAMYCIN ) 100 mg in sodium chloride  0.9 % 250 mL IVPB        100 mg 125 mL/hr over 120 Minutes Intravenous Every 12 hours 03/24/24 0822     03/25/24 0800  cefTRIAXone  (ROCEPHIN ) 2 g in sodium chloride  0.9 % 100 mL IVPB        2 g 200 mL/hr over 30 Minutes Intravenous Every 24 hours 03/24/24 0822 03/30/24 0759   03/24/24 0700  cefTRIAXone  (ROCEPHIN ) 1 g in sodium chloride  0.9 % 100 mL IVPB        1 g 200 mL/hr over 30 Minutes Intravenous  Once 03/24/24 0656 03/24/24 0801   03/24/24 0700  azithromycin  (ZITHROMAX ) 500 mg in sodium chloride  0.9 % 250 mL IVPB        500 mg 250 mL/hr over 60 Minutes Intravenous  Once 03/24/24 0656 03/24/24 0838   03/24/24 0700  cefTRIAXone  (ROCEPHIN ) 1 g in sodium chloride  0.9 % 100 mL IVPB  Status:  Discontinued        1 g 200 mL/hr over 30 Minutes Intravenous  Once 03/24/24 0656 03/24/24 9343      Subjective: Prentiss Finder today has no emesis,  No chest pain,   -Discussed with pt's daughter  Ms. Delon Delapp--at [phone] answered - Tmax 102.1, T-current 100.6  Patient is More awake, able to take some oral intake -- Objective: Vitals:   03/25/24 0750 03/25/24 0800 03/25/24 1000 03/25/24 1111  BP:  (!) 158/56 (!) 144/94   Pulse:  66 66   Resp:  (!) 24 (!) 24   Temp:    (!) 100.6 F (38.1 C)  TempSrc:    Axillary  SpO2: 100% 100% 97%   Weight:      Height:        Intake/Output Summary (Last 24 hours) at 03/25/2024 1141 Last data filed at 03/25/2024 1130 Gross per 24 hour  Intake 3739.56 ml  Output 300 ml  Net 3439.56 ml   Filed Weights   03/24/24 0657 03/24/24 1000  Weight: 79 kg 73.3 kg    Physical Exam Gen:-More awake, more interactive, no acute distress HEENT:- [place].AT, No sclera icterus Nose--[place] 3L/min Neck-Supple Neck,No JVD,.  Lungs-diminished breath sounds, no significant wheezing, few scattered rhonchi   CV- S1, S2 normal, regular , pacemaker in situ Abd-  +ve B.Sounds, Abd Soft, No tenderness,    Extremity/Skin:- No  edema, pedal pulses present  Psych-affect is appropriate, oriented x3 Neuro-no new focal deficits, no tremors MSK-- left heel decubitus ulcer please see photos in epic   Data Reviewed: I have personally reviewed following labs and imaging studies  CBC: Recent Labs  Lab 03/24/24 0700  WBC 3.0*  NEUTROABS 1.9  HGB 9.8*  HCT 30.9*  MCV 107.3*  PLT 68*   Basic Metabolic Panel: Recent Labs  Lab 03/24/24 0700 03/24/24 2022 03/25/24 0347  NA 155* 156* 156*  K 4.7 4.5 4.5  CL 124* 127* 127*  CO2 17* 15* 18*  GLUCOSE 239* 331* 337*  BUN 60* 64* 62*  CREATININE 1.96* 1.89* 1.65*  CALCIUM 7.6* 7.9* 7.8*  MG  --   --  1.8  PHOS  --  2.3*  --    GFR: Estimated Creatinine Clearance: 31.1 mL/min (A) (by C-G formula based on SCr of 1.65 mg/dL (H)). Liver Function Tests: Recent Labs  Lab 03/24/24 0700 03/24/24 2022  AST 15  --   ALT <5  --   ALKPHOS 90  --   BILITOT 0.8  --   PROT 5.9*  --   ALBUMIN 2.6* 2.8*   HbA1C: Recent Labs    03/24/24 0906  HGBA1C 7.3*    Recent Results (from the past 240 hours)  Blood Culture (routine x 2)     Status: None (Preliminary result)   Collection Time: 03/24/24  7:00 AM   Specimen: BLOOD LEFT FOREARM  Result Value Ref Range Status   Specimen Description   Final    BLOOD LEFT FOREARM BOTTLES DRAWN AEROBIC AND ANAEROBIC   Special Requests Blood Culture adequate volume  Final   Culture   Final    NO GROWTH < 24 HOURS Performed at [facility], [address], [place], [place] [address]    Report Status PENDING  Incomplete  Blood Culture (routine x 2)     Status: None (Preliminary result)   Collection Time: 03/24/24  7:01 AM   Specimen: BLOOD RIGHT ARM  Result Value Ref Range Status   Specimen Description   Final    BLOOD RIGHT ARM BOTTLES DRAWN AEROBIC AND ANAEROBIC   Special Requests Blood Culture adequate  volume  Final   Culture   Final    NO GROWTH < 24 HOURS Performed at [facility], [address], [place], [place] [address]    Report Status PENDING  Incomplete  Resp panel by RT-PCR (RSV, Flu A&B, Covid) Anterior Nasal Swab     Status: None   Collection Time: 03/24/24  7:12 AM   Specimen: Anterior Nasal Swab  Result Value Ref Range Status   SARS Coronavirus 2 by RT PCR NEGATIVE NEGATIVE Final    Comment: (NOTE) SARS-CoV-2 target nucleic acids are NOT DETECTED.  The SARS-CoV-2 RNA is generally detectable in upper respiratory specimens during the acute phase of infection. The lowest concentration of SARS-CoV-2 viral copies this assay can detect is 138 copies/mL. A negative result does not preclude SARS-Cov-2 infection and should not be used as the sole basis for treatment or other patient management decisions. A negative result may occur with  improper specimen collection/handling, submission of specimen other than nasopharyngeal swab, presence of viral mutation(s) within the areas targeted by this assay, and inadequate number of viral copies(<138 copies/mL). A negative result must be combined with clinical observations, patient history, and epidemiological information. The expected result is Negative.  Fact Sheet for Patients:  bloggercourse.com  Fact Sheet for Healthcare Providers:  seriousbroker.it  This test is no t yet approved or cleared by the [place]  FDA and  has been authorized for detection and/or diagnosis of SARS-CoV-2 by FDA under an Emergency Use Authorization (EUA). This EUA will remain  in effect (meaning this test can be used) for the duration of the COVID-19 declaration under Section 564(b)(1) of the Act, 21 U.S.C.section 360bbb-3(b)(1), unless the authorization is terminated  or revoked sooner.       Influenza A by PCR NEGATIVE NEGATIVE Final   Influenza B by PCR NEGATIVE NEGATIVE Final    Comment:  (NOTE) The Xpert Xpress SARS-CoV-2/FLU/RSV plus assay is intended as an aid in the diagnosis of influenza from Nasopharyngeal swab specimens and should not be used as a sole basis for treatment. Nasal washings and aspirates are unacceptable for Xpert Xpress SARS-CoV-2/FLU/RSV testing.  Fact Sheet for Patients: bloggercourse.com  Fact Sheet for Healthcare Providers: seriousbroker.it  This test is not yet approved or cleared by the [place]  FDA and has been authorized for detection and/or diagnosis of SARS-CoV-2 by FDA under an Emergency Use Authorization (EUA). This EUA will remain in effect (meaning this test can be used) for the duration of the COVID-19 declaration under Section 564(b)(1) of the Act, 21 U.S.C. section 360bbb-3(b)(1), unless the authorization is terminated or revoked.     Resp Syncytial Virus by PCR NEGATIVE NEGATIVE Final    Comment: (NOTE) Fact Sheet for Patients: bloggercourse.com  Fact Sheet for Healthcare Providers: seriousbroker.it  This test is not yet approved or cleared by the [place]  FDA and has been authorized for detection and/or diagnosis of SARS-CoV-2 by FDA under an Emergency Use Authorization (EUA). This EUA will remain in effect (meaning this test can be used) for the duration of the COVID-19 declaration under Section 564(b)(1) of the Act, 21 U.S.C. section 360bbb-3(b)(1), unless the authorization is terminated or revoked.  Performed at [facility], [address], [place], [place] [address]   MRSA Next Gen by PCR, Nasal     Status: Abnormal   Collection Time: 03/24/24  8:48 AM   Specimen: Nasal Mucosa; Nasal Swab  Result Value Ref Range Status   MRSA by PCR Next Gen DETECTED (A) NOT DETECTED Final    Comment: RESULT CALLED TO, READ BACK BY AND VERIFIED WITH: L HYLTON AT 1711 ON [id number] BY S DALTON (NOTE) The  GeneXpert MRSA  Assay (FDA approved for NASAL specimens only), is one component of a comprehensive MRSA colonization surveillance program. It is not intended to diagnose MRSA infection nor to guide or monitor treatment for MRSA infections. Test performance is not FDA approved in patients less than 74 years old. Performed at [facility], [address], [place], [place] [address]     Radiology Studies: [facility] Chest [facility] 1 View Result Date: 03/24/2024 CLINICAL DATA:  Respiratory distress. EXAM: PORTABLE CHEST 1 VIEW COMPARISON:  03/03/2024 FINDINGS: The cardio pericardial silhouette is enlarged. Retrocardiac atelectasis or infiltrate noted. Right lung clear. Single lead left-sided permanent pacemaker again noted. Telemetry leads overlie the chest. IMPRESSION: Retrocardiac atelectasis or infiltrate. Electronically Signed   By: Camellia Candle M.D.   On: 03/24/2024 07:22   Scheduled Meds:  allopurinol   100 mg Oral Daily   Chlorhexidine  Gluconate Cloth  6 each Topical Q0600   dextromethorphan -guaiFENesin   1 tablet Oral BID   ferrous sulfate   325 mg Oral Q breakfast   insulin  aspart  0-5 Units Subcutaneous QHS   insulin  aspart  0-9 Units Subcutaneous TID WC   ipratropium-albuterol   3 mL Nebulization TID   latanoprost   1 drop Both Eyes QHS   methylPREDNISolone  (SOLU-MEDROL ) injection  40 mg Intravenous Q12H   metoprolol  tartrate  25 mg Oral BID   mupirocin  ointment   Nasal BID   mouth rinse  15 mL Mouth Rinse 4 times per day   pantoprazole   40 mg Oral Daily   sodium chloride  flush  3 mL Intravenous Q12H   sodium chloride  flush  3 mL Intravenous Q12H   tamsulosin   0.4 mg Oral Daily   Continuous Infusions:  cefTRIAXone  (ROCEPHIN )  IV Stopped (03/25/24 0818)   doxycycline  (VIBRAMYCIN ) IV Stopped (03/25/24 1124)   sodium bicarbonate 150 mEq in sterile water  1,150 mL infusion      LOS: 1 day   Rendall Carwin M.D on 03/25/2024 at 11:41 AM  Go to [email or web address] - for contact info  [facility] - Office   [phone]  If 7PM-7AM, please contact night-coverage [email or web address] 03/25/2024, 11:41 AM    "

## 2024-03-25 NOTE — Inpatient Diabetes Management (Signed)
 Inpatient Diabetes Program Recommendations  AACE/ADA: New Consensus Statement on Inpatient Glycemic Control   Target Ranges:  Prepandial:   less than 140 mg/dL      Peak postprandial:   less than 180 mg/dL (1-2 hours)      Critically ill patients:  140 - 180 mg/dL    Latest Reference Range & Units 03/24/24 09:20 03/24/24 11:02 03/24/24 13:00 03/24/24 16:18 03/24/24 21:10 03/25/24 07:10  Glucose-Capillary 70 - 99 mg/dL 788 (H) 776 (H) 702 (H) 290 (H) 275 (H) 349 (H)   Review of Glycemic Control  Diabetes history: DM2 Outpatient Diabetes medications: Glipizide 5 mg daily, Metformin 1000 mg BID, Humulin 70/30 (no dose or frequency noted on home med list) Current orders for Inpatient glycemic control: Novolog  0-9 units TID with meals, Novolog  0-5 units at bedtime; Solumedrol 40 mg Q12H  Inpatient Diabetes Program Recommendations:    Insulin : CBG 349 mg/dl this morning. If steroids are continued, please consider ordering insulin  glargine 14 units Q24H.  Thanks, Earnie Gainer, RN, MSN, CDCES Diabetes Coordinator Inpatient Diabetes Program [phone] (Team Pager from 8am to 5pm)

## 2024-03-25 NOTE — TOC Initial Note (Addendum)
 Transition of Care [facility]) - Initial/Assessment Note    Patient Details  Name: Jay Campbell MRN: [id number] Date of Birth: Jul 07, 1945  [facility] CM/SW Contact:    Lucie Lunger, LCSWA Phone Number: 03/25/2024, 9:59 AM  Clinical Narrative:                 CSW notes pt to be high risk for readmission. CSW notes per chart review that pt is from facility. CSW spoke to Ozell with [facility] who confirms that pt is a long term care resident and can return once medically stable. CSW spoke with pts son who confirms plan for return to [facility] once medically stable. TOC to follow.   Expected Discharge Plan: Long Term Nursing Home Barriers to Discharge: Continued Medical Work up   Patient Goals and CMS Choice Patient states their goals for this hospitalization and ongoing recovery are:: return to LTC CMS Medicare.gov Compare Post Acute Care list provided to:: Patient Choice offered to / list presented to : Patient      Expected Discharge Plan and Services In-house Referral: Clinical Social Work Discharge Planning Services: CM Consult Post Acute Care Choice: Nursing Home Living arrangements for the past 2 months: Skilled Nursing Facility                                      Prior Living Arrangements/Services Living arrangements for the past 2 months: Skilled Nursing Facility Lives with:: Facility Resident Patient language and need for interpreter reviewed:: Yes Do you feel safe going back to the place where you live?: Yes      Need for Family Participation in Patient Care: Yes (Comment) Care giver support system in place?: Yes (comment)   Criminal Activity/Legal Involvement Pertinent to Current Situation/Hospitalization: No - Comment as needed  Activities of Daily Living      Permission Sought/Granted                  Emotional Assessment Appearance:: Appears stated age       Alcohol / Substance Use: Not Applicable Psych Involvement: No  (comment)  Admission diagnosis:  Dehydration [E86.0] Hypernatremia [E87.0] AKI (acute kidney injury) [N17.9] Pancytopenia (HCC) [D61.818] Acute metabolic encephalopathy [G93.41] Sepsis with acute hypoxic respiratory failure without septic shock, due to unspecified organism (HCC) [A41.9, R65.20, J96.01] Acute hypoxic respiratory failure (HCC) [J96.01] Patient Active Problem List   Diagnosis Date Noted   Acute hypoxic respiratory failure (HCC) 03/24/2024   Iron deficiency anemia 01/11/2022   Acute encephalopathy 03/11/2018   Mild renal insufficiency 03/11/2018   Anxiety 03/11/2018   Insulin -requiring or dependent type II diabetes mellitus (HCC) 03/11/2018   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   Precordial pain 03/20/2014   Complete heart block (HCC) 02/01/2013   S/P AV nodal ablation    Warfarin anticoagulation    CORONARY ATHEROSCLEROSIS NATIVE CORONARY ARTERY 02/01/2008   Permanent atrial fibrillation (HCC) 02/01/2008   Cardiac pacemaker in situ 02/01/2008   PCP:  [name], MD Pharmacy:   [facility] - [place], [place] - [address] [place] [address] Phone: [phone] Fax: [phone]  [facility] - [place], [place] - [address] [place] [address] Phone: [phone] Fax: [phone]  [facility] - [place], [place] - [address] [place] [address]  Phone: [phone] Fax: [phone]     Social Drivers of Health (SDOH) Social History: SDOH Screenings   Food Insecurity: Patient Unable To Answer (03/24/2024)  Housing: Patient Unable To Answer (03/24/2024)  Transportation Needs: Patient Unable To Answer (03/24/2024)  Utilities: Patient Unable To Answer (03/24/2024)  Financial Resource Strain: Low Risk (07/01/2023)   Received from [facility]  Social Connections: Patient Unable To Answer (03/24/2024)  Tobacco Use:  Low Risk (03/24/2024)   SDOH Interventions:     Readmission Risk Interventions    03/25/2024    9:58 AM  Readmission Risk Prevention Plan  Transportation Screening Complete  Medication Review Oceanographer) Complete  HRI or Home Care Consult Complete  SW Recovery Care/Counseling Consult Complete  Palliative Care Screening Not Applicable  Skilled Nursing Facility Complete

## 2024-03-26 LAB — BASIC METABOLIC PANEL WITH GFR
Anion gap: 12 (ref 5–15)
Anion gap: 14 (ref 5–15)
BUN: 66 mg/dL — ABNORMAL HIGH (ref 8–23)
BUN: 76 mg/dL — ABNORMAL HIGH (ref 8–23)
CO2: 23 mmol/L (ref 22–32)
CO2: 19 mmol/L — ABNORMAL LOW (ref 22–32)
Calcium: 7.6 mg/dL — ABNORMAL LOW (ref 8.9–10.3)
Calcium: 8.3 mg/dL — ABNORMAL LOW (ref 8.9–10.3)
Chloride: 120 mmol/L — ABNORMAL HIGH (ref 98–111)
Chloride: 126 mmol/L — ABNORMAL HIGH (ref 98–111)
Creatinine, Ser: 1.54 mg/dL — ABNORMAL HIGH (ref 0.61–1.24)
Creatinine, Ser: 1.74 mg/dL — ABNORMAL HIGH (ref 0.61–1.24)
GFR, Estimated: 45 mL/min — ABNORMAL LOW
GFR, Estimated: 39 mL/min — ABNORMAL LOW
Glucose, Bld: 273 mg/dL — ABNORMAL HIGH (ref 70–99)
Glucose, Bld: 272 mg/dL — ABNORMAL HIGH (ref 70–99)
Potassium: 3.9 mmol/L (ref 3.5–5.1)
Potassium: 4.2 mmol/L (ref 3.5–5.1)
Sodium: 155 mmol/L — ABNORMAL HIGH (ref 135–145)
Sodium: 159 mmol/L — ABNORMAL HIGH (ref 135–145)

## 2024-03-26 LAB — CBC
HCT: 23.8 % — ABNORMAL LOW (ref 39.0–52.0)
Hemoglobin: 8.1 g/dL — ABNORMAL LOW (ref 13.0–17.0)
MCH: 35.1 pg — ABNORMAL HIGH (ref 26.0–34.0)
MCHC: 34 g/dL (ref 30.0–36.0)
MCV: 103 fL — ABNORMAL HIGH (ref 80.0–100.0)
Platelets: 34 K/uL — ABNORMAL LOW (ref 150–400)
RBC: 2.31 MIL/uL — ABNORMAL LOW (ref 4.22–5.81)
RDW: 15.1 % (ref 11.5–15.5)
WBC: 2.1 K/uL — ABNORMAL LOW (ref 4.0–10.5)
nRBC: 1.4 % — ABNORMAL HIGH (ref 0.0–0.2)

## 2024-03-26 LAB — GLUCOSE, CAPILLARY
Glucose-Capillary: 281 mg/dL — ABNORMAL HIGH (ref 70–99)
Glucose-Capillary: 301 mg/dL — ABNORMAL HIGH (ref 70–99)
Glucose-Capillary: 253 mg/dL — ABNORMAL HIGH (ref 70–99)
Glucose-Capillary: 255 mg/dL — ABNORMAL HIGH (ref 70–99)

## 2024-03-26 LAB — SODIUM: Sodium: 157 mmol/L — ABNORMAL HIGH (ref 135–145)

## 2024-03-26 MED ORDER — METHYLPREDNISOLONE SODIUM SUCC 40 MG IJ SOLR
40.0000 mg | Freq: Every day | INTRAMUSCULAR | Status: DC
  Administered 2024-03-27: 40 mg via INTRAVENOUS
  Filled 2024-03-26: qty 1

## 2024-03-26 MED ORDER — STERILE WATER FOR INJECTION IV SOLN
INTRAVENOUS | Status: DC
  Filled 2024-03-26 (×2): qty 1000

## 2024-03-26 MED ORDER — STERILE WATER FOR INJECTION IV SOLN
INTRAVENOUS | Status: DC
  Filled 2024-03-26: qty 1000

## 2024-03-26 MED ORDER — INSULIN GLARGINE 100 UNIT/ML ~~LOC~~ SOLN
10.0000 [IU] | Freq: Every day | SUBCUTANEOUS | Status: DC
  Administered 2024-03-26 – 2024-03-27 (×2): 10 [IU] via SUBCUTANEOUS
  Filled 2024-03-26 (×3): qty 0.1

## 2024-03-26 MED ORDER — SODIUM CHLORIDE 0.45 % IV SOLN: INTRAVENOUS | Status: DC

## 2024-03-26 MED ORDER — POTASSIUM PHOSPHATES 15 MMOLE/5ML IV SOLN
15.0000 mmol | Freq: Once | INTRAVENOUS | Status: AC
  Administered 2024-03-26: 15 mmol via INTRAVENOUS
  Filled 2024-03-26: qty 5

## 2024-03-26 NOTE — Progress Notes (Signed)
 " PROGRESS NOTE  Jay Campbell, is a 79 y.o. male, DOB - [birth year], FMW:[id number]  Admit date - 03/24/2024   Admitting Physician Basim Bartnik Pearlean, MD  Outpatient Primary MD for the patient is [name], MD  LOS - 2  Chief Complaint  Patient presents with   Code Sepsis   Altered Mental Status   Shortness of Breath      Brief Narrative:  78 y.o. male with medical history significant  for dementia, CAD, afib (on Eliquis ), gout, anxiety, smoldering multiple myeloma, history of sick sinus syndrome/heart block with status post pacemaker placement, DM2 and HTN admitted on 03/24/2018/with sepsis secondary to pneumonia leading to acute hypoxic respiratory failure, dehydration AKI and hypernatremia  -Assessment and Plan: 1)Sepsis secondary to Pneumonia--CAP---POA - Chest x-ray with retrocardiac infiltrate--- suspect post influenza pneumonia --COVID flu and RSV negative this admission -WBC 3.0 similar to prior, patient with history of leukopenia -Lactic acid is 2.6 >>2.6 >>3.4 >>2.5 --Fever resolving, mentation improving ---c/n IV Rocephin , Doxycycline , mucolytics and bronchodilators --Continue aggressive IV fluids   2)Acute hypoxic respiratory failure--- initially required nonrebreather bag - Hypoxia Resolved - Patient weaned off oxygen  completely at this time  --Reduce IV Solu-Medrol  to once daily from every 12 --Continue to treat pneumonia as above #1   3)Possible UTI--- IV Rocephin  as above #1 pending culture data   4)Dehydration and Hypernatremia---with metabolic acidosis --sodium remains elevated at 156 BUN/creatinine ratio greater than 30 -- Suspect prerenal azotemia and dehydration in setting of altered mentation with poor oral intake --Elevated sodium persist---Will adjust IV fluids - Serial chemistries   5)AKI----acute kidney injury on CKD stage -3B with metabolic acidosis - Creatinine is 1.96, baseline creatinine usually 1.4-1.6 -BUN elevated at 60 baseline usually  around 30 -Continue IV fluids as above #4 --Hold lisinopril and metformin -renally adjust medications, avoid nephrotoxic agents / dehydration  / hypotension   6)Acute metabolic encephalopathy--- due to #1, #2 #3 and #4 above --Superimposed on underlying dementia -Hold Depakote, hold Zoloft , hold Actos, hold Atarax, hold lorazepam, hold Aricept  --Patient is more awake , he is able to take some oral intake at this time -- Manage as above   7)Chronic Pancytopenia--- in the setting of smoldering multiple myeloma WBC 3.0 similar to prior, patient with history of leukopenia - Hemoglobin 9.8 similar to prior patient has history of chronic anemia - Platelets 68 patient has history of chronic thrombocytopenia -- -Hold apixaban  due to concerns about increased risk of bleeding -Anticipate that Hgb will drop with IV hydration   8)Afib/ history of sick sinus syndrome/heart block with status post pacemaker placement -Continue metoprolol  for rate control - Discontinued Eliquis  given thrombocytopenia, especially in the setting of renal dysfunction with increased risk of bleeding from Eliquis    9)DM2--A1c 7.3 reflecting uncontrolled DM with hyperglycemia PTA --Hold 70/30 insulin , hold metformin and glipizide -Use Novolog /Humalog Sliding scale insulin  with Accu-Cheks/Fingersticks as ordered    10)GERD--continue Protonix    11)BPH with LUTs--continue Flomax    12)Lt Heel Pressure Injury--POA -- Wound care consult appreciated, recommends Cleanse heel wound with saline, pat dry, Cover entire area with single layer of xeroform and top with foam dressing Offload at all times with prevalon boot  13)Social/Ethics---Discussed with pt's daughter Ms. Delon Delapp--at [phone]--- -Per Daughter he can walk,  on a good day he walks at times from his room to the dinning room with a Sawtell/Rolator----at times he uses a wheelchair, rather than ambulating - He often requires one-on-one sitter at [facility] due to confusion/agitation/behavioral disturbance  issues  Status is: Inpatient  Disposition: The patient is from: SNF              Anticipated d/c is to: SNF              Anticipated d/c date is: 3 days              Patient currently is not medically stable to d/c. Barriers: Not Clinically Stable-   Code Status :  -  Code Status: Limited: Do not attempt resuscitation (DNR) -DNR-LIMITED -Do Not Intubate/DNI    Family Communication:   -Discussed with pt's daughter Ms. Delon Delapp--at [phone]--  DVT Prophylaxis  :   - SCDs   Place and maintain sequential compression device Start: 03/24/24 1940 SCDs Start: 03/24/24 0828 Place TED hose Start: 03/24/24 0828  Lab Results  Component Value Date   PLT 34 (L) 03/26/2024   Inpatient Medications  Scheduled Meds:  allopurinol   100 mg Oral Daily   Chlorhexidine  Gluconate Cloth  6 each Topical Q0600   dextromethorphan -guaiFENesin   1 tablet Oral BID   ferrous sulfate   325 mg Oral Q breakfast   insulin  aspart  0-5 Units Subcutaneous QHS   insulin  aspart  0-9 Units Subcutaneous TID WC   insulin  glargine  10 Units Subcutaneous Daily   ipratropium-albuterol   3 mL Nebulization TID   latanoprost   1 drop Both Eyes QHS   methylPREDNISolone  (SOLU-MEDROL ) injection  40 mg Intravenous Q12H   metoprolol  tartrate  25 mg Oral BID   mupirocin  ointment   Nasal BID   mouth rinse  15 mL Mouth Rinse 4 times per day   pantoprazole   40 mg Oral Daily   sodium chloride  flush  3 mL Intravenous Q12H   sodium chloride  flush  3 mL Intravenous Q12H   tamsulosin   0.4 mg Oral Daily   Continuous Infusions:  sodium chloride      cefTRIAXone  (ROCEPHIN )  IV Stopped (03/26/24 0820)   doxycycline  (VIBRAMYCIN ) IV Stopped (03/26/24 1208)   potassium PHOSPHATE  IVPB (in mmol) 43 mL/hr at 03/26/24 1658   sodium bicarbonate 150 mEq in sterile water  1,150 mL infusion     PRN Meds:.acetaminophen  **OR** acetaminophen , albuterol , antiseptic oral rinse, bisacodyl ,  ondansetron  **OR** ondansetron  (ZOFRAN ) IV, mouth rinse, polyethylene glycol, sodium chloride  flush   Anti-infectives (From admission, onward)    Start     Dose/Rate Route Frequency Ordered Stop   03/25/24 1000  doxycycline  (VIBRAMYCIN ) 100 mg in sodium chloride  0.9 % 250 mL IVPB        100 mg 125 mL/hr over 120 Minutes Intravenous Every 12 hours 03/24/24 0822     03/25/24 0800  cefTRIAXone  (ROCEPHIN ) 2 g in sodium chloride  0.9 % 100 mL IVPB        2 g 200 mL/hr over 30 Minutes Intravenous Every 24 hours 03/24/24 0822 03/30/24 0759   03/24/24 0700  cefTRIAXone  (ROCEPHIN ) 1 g in sodium chloride  0.9 % 100 mL IVPB        1 g 200 mL/hr over 30 Minutes Intravenous  Once 03/24/24 0656 03/24/24 0801   03/24/24 0700  azithromycin  (ZITHROMAX ) 500 mg in sodium chloride  0.9 % 250 mL IVPB        500 mg 250 mL/hr over 60 Minutes Intravenous  Once 03/24/24 0656 03/24/24 0838   03/24/24 0700  cefTRIAXone  (ROCEPHIN ) 1 g in sodium chloride  0.9 % 100 mL IVPB  Status:  Discontinued        1 g 200 mL/hr over 30 Minutes Intravenous  Once 03/24/24 0656 03/24/24 0656      Subjective: Jay Campbell today has no emesis,  No chest pain,   -Fever resolving Patient is More awake, able to take some oral intake --Voiding okay -- Objective: Vitals:   03/26/24 1000 03/26/24 1100 03/26/24 1200 03/26/24 1600  BP: (!) 157/65 (!) 111/91 138/65   Pulse: 63 62 63   Resp: 19 17 16    Temp:  98.1 F (36.7 C)  98.3 F (36.8 C)  TempSrc:  Axillary  Axillary  SpO2: 96% 96% 97%   Weight:      Height:        Intake/Output Summary (Last 24 hours) at 03/26/2024 1702 Last data filed at 03/26/2024 1658 Gross per 24 hour  Intake 2049.9 ml  Output 350 ml  Net 1699.9 ml   Filed Weights   03/24/24 0657 03/24/24 1000  Weight: 79 kg 73.3 kg    Physical Exam Gen:-More awake, more interactive, no acute distress HEENT:- [place].AT, No sclera icterus Neck-Supple Neck,No JVD,.  Lungs-improving air movement, no wheezing   CV- S1, S2 normal, regular , pacemaker in situ Abd-  +ve B.Sounds, Abd Soft, No tenderness,    Extremity/Skin:- No  edema, pedal pulses present  Psych-affect is flat,, disoriented with memory and cognitive deficits neuro-generalized weakness, no new focal deficits, no tremors MSK-- left heel decubitus ulcer please see photos in epic   Data Reviewed: I have personally reviewed following labs and imaging studies  CBC: Recent Labs  Lab 03/24/24 0700 03/26/24 0324  WBC 3.0* 2.1*  NEUTROABS 1.9  --   HGB 9.8* 8.1*  HCT 30.9* 23.8*  MCV 107.3* 103.0*  PLT 68* 34*   Basic Metabolic Panel: Recent Labs  Lab 03/24/24 2022 03/25/24 0347 03/25/24 1630 03/26/24 0324 03/26/24 1524  NA 156* 156* 157* 155* 159*  K 4.5 4.5 4.1 3.9 4.2  CL 127* 127* 129* 120* 126*  CO2 15* 18* 15* 23 19*  GLUCOSE 331* 337* 289* 273* 272*  BUN 64* 62* 69* 66* 76*  CREATININE 1.89* 1.65* 1.77* 1.54* 1.74*  CALCIUM 7.9* 7.8* 8.1* 7.6* 8.3*  MG  --  1.8  --   --   --   PHOS 2.3*  --  2.3*  --   --    GFR: Estimated Creatinine Clearance: 29.5 mL/min (A) (by C-G formula based on SCr of 1.74 mg/dL (H)). Liver Function Tests: Recent Labs  Lab 03/24/24 0700 03/24/24 2022 03/25/24 1630  AST 15  --   --   ALT <5  --   --   ALKPHOS 90  --   --   BILITOT 0.8  --   --   PROT 5.9*  --   --   ALBUMIN 2.6* 2.8* 2.7*   HbA1C: Recent Labs    03/24/24 0906  HGBA1C 7.3*    Recent Results (from the past 240 hours)  Blood Culture (routine x 2)     Status: None (Preliminary result)   Collection Time: 03/24/24  7:00 AM   Specimen: BLOOD LEFT FOREARM  Result Value Ref Range Status   Specimen Description   Final    BLOOD LEFT FOREARM BOTTLES DRAWN AEROBIC AND ANAEROBIC   Special Requests Blood Culture adequate volume  Final   Culture   Final    NO GROWTH 2 DAYS Performed at [facility], [address] [place], [place] [address]    Report Status PENDING  Incomplete  Blood Culture (routine x 2)  Status: None (Preliminary result)   Collection Time: 03/24/24  7:01 AM   Specimen: BLOOD RIGHT ARM  Result Value Ref Range Status   Specimen Description   Final    BLOOD RIGHT ARM BOTTLES DRAWN AEROBIC AND ANAEROBIC   Special Requests Blood Culture adequate volume  Final   Culture   Final    NO GROWTH 2 DAYS Performed at [facility], [address], [place], [place] [address]    Report Status PENDING  Incomplete  Resp panel by RT-PCR (RSV, Flu A&B, Covid) Anterior Nasal Swab     Status: None   Collection Time: 03/24/24  7:12 AM   Specimen: Anterior Nasal Swab  Result Value Ref Range Status   SARS Coronavirus 2 by RT PCR NEGATIVE NEGATIVE Final    Comment: (NOTE) SARS-CoV-2 target nucleic acids are NOT DETECTED.  The SARS-CoV-2 RNA is generally detectable in upper respiratory specimens during the acute phase of infection. The lowest concentration of SARS-CoV-2 viral copies this assay can detect is 138 copies/mL. A negative result does not preclude SARS-Cov-2 infection and should not be used as the sole basis for treatment or other patient management decisions. A negative result may occur with  improper specimen collection/handling, submission of specimen other than nasopharyngeal swab, presence of viral mutation(s) within the areas targeted by this assay, and inadequate number of viral copies(<138 copies/mL). A negative result must be combined with clinical observations, patient history, and epidemiological information. The expected result is Negative.  Fact Sheet for Patients:  bloggercourse.com  Fact Sheet for Healthcare Providers:  seriousbroker.it  This test is no t yet approved or cleared by the [place]  FDA and  has been authorized for detection and/or diagnosis of SARS-CoV-2 by FDA under an Emergency Use Authorization (EUA). This EUA will remain  in effect (meaning this test can be used) for the duration of  the COVID-19 declaration under Section 564(b)(1) of the Act, 21 U.S.C.section 360bbb-3(b)(1), unless the authorization is terminated  or revoked sooner.       Influenza A by PCR NEGATIVE NEGATIVE Final   Influenza B by PCR NEGATIVE NEGATIVE Final    Comment: (NOTE) The Xpert Xpress SARS-CoV-2/FLU/RSV plus assay is intended as an aid in the diagnosis of influenza from Nasopharyngeal swab specimens and should not be used as a sole basis for treatment. Nasal washings and aspirates are unacceptable for Xpert Xpress SARS-CoV-2/FLU/RSV testing.  Fact Sheet for Patients: bloggercourse.com  Fact Sheet for Healthcare Providers: seriousbroker.it  This test is not yet approved or cleared by the [place]  FDA and has been authorized for detection and/or diagnosis of SARS-CoV-2 by FDA under an Emergency Use Authorization (EUA). This EUA will remain in effect (meaning this test can be used) for the duration of the COVID-19 declaration under Section 564(b)(1) of the Act, 21 U.S.C. section 360bbb-3(b)(1), unless the authorization is terminated or revoked.     Resp Syncytial Virus by PCR NEGATIVE NEGATIVE Final    Comment: (NOTE) Fact Sheet for Patients: bloggercourse.com  Fact Sheet for Healthcare Providers: seriousbroker.it  This test is not yet approved or cleared by the [place]  FDA and has been authorized for detection and/or diagnosis of SARS-CoV-2 by FDA under an Emergency Use Authorization (EUA). This EUA will remain in effect (meaning this test can be used) for the duration of the COVID-19 declaration under Section 564(b)(1) of the Act, 21 U.S.C. section 360bbb-3(b)(1), unless the authorization is terminated or revoked.  Performed at [facility], [address] [place], [place]  [address]   MRSA Next Gen by PCR, Nasal     Status: Abnormal   Collection Time: 03/24/24   8:48 AM   Specimen: Nasal Mucosa; Nasal Swab  Result Value Ref Range Status   MRSA by PCR Next Gen DETECTED (A) NOT DETECTED Final    Comment: RESULT CALLED TO, READ BACK BY AND VERIFIED WITH: L HYLTON AT 1711 ON [id number] BY S DALTON (NOTE) The GeneXpert MRSA Assay (FDA approved for NASAL specimens only), is one component of a comprehensive MRSA colonization surveillance program. It is not intended to diagnose MRSA infection nor to guide or monitor treatment for MRSA infections. Test performance is not FDA approved in patients less than 40 years old. Performed at [facility], [address] [place], [place] [address]    Scheduled Meds:  allopurinol   100 mg Oral Daily   Chlorhexidine  Gluconate Cloth  6 each Topical Q0600   dextromethorphan -guaiFENesin   1 tablet Oral BID   ferrous sulfate   325 mg Oral Q breakfast   insulin  aspart  0-5 Units Subcutaneous QHS   insulin  aspart  0-9 Units Subcutaneous TID WC   insulin  glargine  10 Units Subcutaneous Daily   ipratropium-albuterol   3 mL Nebulization TID   latanoprost   1 drop Both Eyes QHS   methylPREDNISolone  (SOLU-MEDROL ) injection  40 mg Intravenous Q12H   metoprolol  tartrate  25 mg Oral BID   mupirocin  ointment   Nasal BID   mouth rinse  15 mL Mouth Rinse 4 times per day   pantoprazole   40 mg Oral Daily   sodium chloride  flush  3 mL Intravenous Q12H   sodium chloride  flush  3 mL Intravenous Q12H   tamsulosin   0.4 mg Oral Daily   Continuous Infusions:  sodium chloride      cefTRIAXone  (ROCEPHIN )  IV Stopped (03/26/24 0820)   doxycycline  (VIBRAMYCIN ) IV Stopped (03/26/24 1208)   potassium PHOSPHATE  IVPB (in mmol) 43 mL/hr at 03/26/24 1658   sodium bicarbonate 150 mEq in sterile water  1,150 mL infusion      LOS: 2 days   Rendall Carwin M.D on 03/26/2024 at 5:02 PM  Go to [email or web address] - for contact info  [facility] - Office  [phone]  If 7PM-7AM, please contact night-coverage [email or web address] 03/26/2024, 5:02 PM     "

## 2024-03-26 NOTE — Care Management Important Message (Deleted)
 Important Message  Patient Details  Name: Jay Campbell MRN: [id number] Date of Birth: [birth year]   Important Message Given:  Yes - Medicare IM     Evalin Shawhan L Verona Hartshorn 03/26/2024, 5:08 PM

## 2024-03-26 NOTE — Plan of Care (Signed)

## 2024-03-26 NOTE — Inpatient Diabetes Management (Signed)
 Inpatient Diabetes Program Recommendations  AACE/ADA: New Consensus Statement on Inpatient Glycemic Control   Target Ranges:  Prepandial:   less than 140 mg/dL      Peak postprandial:   less than 180 mg/dL (1-2 hours)      Critically ill patients:  140 - 180 mg/dL   Review of Glycemic Control  Latest Reference Range & Units 03/25/24 07:10 03/25/24 11:16 03/25/24 16:03 03/25/24 21:18 03/26/24 07:17 03/26/24 10:36  Glucose-Capillary 70 - 99 mg/dL 650 (H) 724 (H) 746 (H) 238 (H) 281 (H) 301 (H)   Diabetes history: DM2 Outpatient Diabetes medications: Glipizide 5 mg daily, Metformin 1000 mg BID, Humulin 70/30 (no dose or frequency noted on home med list) Current orders for Inpatient glycemic control:  Novolog  0-9 units TID + HS Solumedrol 40 mg Q12H  Inpatient Diabetes Program Recommendations:    Insulin : CBG 301 mg/dl this morning. If steroids are continued, please consider ordering: -   insulin  glargine 10 units Q24H. -   Novolog  3 units tid meal coverage if eating >50% of meals  Thanks, Clotilda Bull RN, MSN, BC-ADM Inpatient Diabetes Coordinator Team Pager [phone] (8a-5p)

## 2024-03-26 NOTE — Care Management Important Message (Signed)
 Important Message  Patient Details  Name: Jay Campbell MRN: [id number] Date of Birth: 06/19/1945   Important Message Given:  N/A - LOS <3 / Initial given by admissions     [name] 03/26/2024, 5:09 PM

## 2024-03-26 NOTE — Progress Notes (Signed)
 Speech Language Pathology Treatment: Dysphagia  Patient Details Name: Jay Campbell MRN: [id number] DOB: 07-16-1945 Today's Date: 03/26/2024 Time: 9054-8988 SLP Time Calculation (min) (ACUTE ONLY): 26 min  Assessment / Plan / Recommendation Clinical Impression  Recommend initiating a conservative diet of Dysphagia 1(puree)/nectar-thickened liquids with full A/supervision for all po intake during acute stay.  ST will f/u for potential objective assessment prn and dysphagia tx/diet tolerance.  Pt seen for a f/u dysphagia tx session with improved mentation and pt answering simple questions, but pleasantly confused.  Pt consumed ice chips (single), tsp amounts of thin with delayed coughing noted 20% of the time (1/5 swallows), but it should be noted, pt was coughing prior to any po intake.  Nectar-thickened liquids provided without coughing noted during small trial via tsp/sip.  Pt tolerated partial oral care prior to intake with a strong cough observed.  Poor dentition noted.  Pt exhibited cognitive-based dysphagia including oral retention/holding, delayed initiation of the swallow and max multimodal cues provided by SLP to swallow prior to next bite/sip.  Nursing staff administered medications crushed in puree without overt s/s of aspiration present.  Due to improved alertness level and NPO status remaining, recommend initiating a conservative diet (Dysphagia 1(puree)/nectar-thickened liquids) with ST f/u for tolerance/education/dysphagia tx.   HPI HPI: Jay Campbell is a 79 y.o. male with medical history significant for dementia, CAD, afib (on Eliquis ), gout, anxiety, smoldering multiple myeloma, history of sick sinus syndrome/heart block with status post pacemaker placement, DM2 and HTN presents by EMS from SNF with altered mentation (Pt bib EMS after staff called for Respiratory distress. Staff reported to EMS that pt's O2 sats on R/A were in the 60's and that pt was more altered than usual.  EMS states that they placed pt on 10L NRB and sats improved to 94-96%. Temp per EMS was 102.6 axillary and pt was given 1gm Rocephin  as well as approximately 700ml NS). Chest xray shows: Retrocardiac atelectasis or infiltrate. Pt recently had the flu. He is a long term care resident at [facility]. BSE completed on 03/25/24 with pt placed NPO except medications.  ST f/u for diet progression/dysphagia tx.      SLP Plan  Continue with current plan of care        Swallow Evaluation Recommendations   Recommendations: PO diet PO Diet Recommendation: Dysphagia 1 (Pureed);Mildly thick liquids (Level 2, nectar thick) Liquid Administration via: Cup;Spoon;Other (Comment) (max cues when alert) Medication Administration: Crushed with puree Supervision: Full assist for feeding;Full supervision/cueing for swallowing strategies Postural changes: Position pt fully upright for meals Oral care recommendations: Oral care QID (4x/day);Staff/trained caregiver to provide oral care     Recommendations                     Oral care before and after PO;Staff/trained caregiver to provide oral care;Oral care QID   Frequent or constant Supervision/Assistance Dysphagia, unspecified (R13.10)     Continue with current plan of care     Jay Campbell,M.S.,CCC-SLP  03/26/2024, 10:25 AM

## 2024-03-27 DIAGNOSIS — I442 Atrioventricular block, complete: Secondary | ICD-10-CM

## 2024-03-27 DIAGNOSIS — Z950 Presence of cardiac pacemaker: Secondary | ICD-10-CM

## 2024-03-27 DIAGNOSIS — G9340 Encephalopathy, unspecified: Secondary | ICD-10-CM | POA: Diagnosis not present

## 2024-03-27 DIAGNOSIS — E870 Hyperosmolality and hypernatremia: Secondary | ICD-10-CM | POA: Diagnosis not present

## 2024-03-27 DIAGNOSIS — N179 Acute kidney failure, unspecified: Secondary | ICD-10-CM

## 2024-03-27 DIAGNOSIS — I4821 Permanent atrial fibrillation: Secondary | ICD-10-CM | POA: Diagnosis not present

## 2024-03-27 DIAGNOSIS — J9601 Acute respiratory failure with hypoxia: Secondary | ICD-10-CM | POA: Diagnosis not present

## 2024-03-27 DIAGNOSIS — D472 Monoclonal gammopathy: Secondary | ICD-10-CM | POA: Diagnosis not present

## 2024-03-27 DIAGNOSIS — E860 Dehydration: Secondary | ICD-10-CM | POA: Diagnosis not present

## 2024-03-27 LAB — RENAL FUNCTION PANEL
Albumin: 2.3 g/dL — ABNORMAL LOW (ref 3.5–5.0)
Anion gap: 11 (ref 5–15)
BUN: 76 mg/dL — ABNORMAL HIGH (ref 8–23)
CO2: 21 mmol/L — ABNORMAL LOW (ref 22–32)
Calcium: 8.1 mg/dL — ABNORMAL LOW (ref 8.9–10.3)
Chloride: 125 mmol/L — ABNORMAL HIGH (ref 98–111)
Creatinine, Ser: 1.66 mg/dL — ABNORMAL HIGH (ref 0.61–1.24)
GFR, Estimated: 41 mL/min — ABNORMAL LOW
Glucose, Bld: 260 mg/dL — ABNORMAL HIGH (ref 70–99)
Phosphorus: 3.7 mg/dL (ref 2.5–4.6)
Potassium: 4.8 mmol/L (ref 3.5–5.1)
Sodium: 157 mmol/L — ABNORMAL HIGH (ref 135–145)

## 2024-03-27 LAB — GLUCOSE, CAPILLARY
Glucose-Capillary: 263 mg/dL — ABNORMAL HIGH (ref 70–99)
Glucose-Capillary: 271 mg/dL — ABNORMAL HIGH (ref 70–99)
Glucose-Capillary: 238 mg/dL — ABNORMAL HIGH (ref 70–99)
Glucose-Capillary: 310 mg/dL — ABNORMAL HIGH (ref 70–99)
Glucose-Capillary: 298 mg/dL — ABNORMAL HIGH (ref 70–99)

## 2024-03-27 LAB — SODIUM: Sodium: 157 mmol/L — ABNORMAL HIGH (ref 135–145)

## 2024-03-27 MED ORDER — GUAIFENESIN 100 MG/5ML PO LIQD
15.0000 mL | Freq: Two times a day (BID) | ORAL | Status: DC
  Administered 2024-03-27 – 2024-03-31 (×9): 15 mL via ORAL
  Filled 2024-03-27 (×9): qty 15

## 2024-03-27 MED ORDER — DOXYCYCLINE HYCLATE 100 MG PO TABS
100.0000 mg | ORAL_TABLET | Freq: Two times a day (BID) | ORAL | Status: DC
  Administered 2024-03-27 – 2024-03-31 (×8): 100 mg via ORAL
  Filled 2024-03-27 (×8): qty 1

## 2024-03-27 MED ORDER — SODIUM CHLORIDE 0.45 % IV SOLN: INTRAVENOUS | Status: AC

## 2024-03-27 NOTE — Plan of Care (Signed)
  Problem: Education: Goal: Knowledge of General Education information will improve Description: Including pain rating scale, medication(s)/side effects and non-pharmacologic comfort measures Outcome: Not Progressing   Problem: Activity: Goal: Risk for activity intolerance will decrease Outcome: Not Progressing   Problem: Safety: Goal: Ability to remain free from injury will improve Outcome: Not Progressing

## 2024-03-27 NOTE — Plan of Care (Signed)

## 2024-03-27 NOTE — Progress Notes (Signed)
 Speech Language Pathology Treatment: Dysphagia  Patient Details Name: Jay Campbell MRN: [id number] DOB: [birth year] Today's Date: 03/27/2024 Time: 8684-8670 SLP Time Calculation (min) (ACUTE ONLY): 14 min  Assessment / Plan / Recommendation Clinical Impression  Recommend continue current diet of Dysphagia 1(puree)/nectar-thickened liquids via tsp when pt alert/responsive with general swallow precautions focusing on cognitive-based dysphagia. ST f/u for diet progression/dysphagia tx and potential objective assessment.  Pt seen for dysphagia tx/re-assessment with pt mentation improved cumulatively.  Pt required mod verbal/tactile cues during trial to reduce oral retention and to initiate swallow response d/t oral holding intermittently.  Pt exhibited delayed coughing with nectar-thickened liquids via cup sips, but this was eliminated with tsp of nectar-thickened liquids.  Recommend medications crushed in puree and/or whole in puree if unable to be crushed.  Nursing stated pt consumed medications without dysphagia symptoms earlier this shift.  ST will continue to f/u during acute stay for dysphagia tx/education.    HPI HPI: Jay Campbell is a 79 y.o. male with medical history significant for dementia, CAD, afib (on Eliquis ), gout, anxiety, smoldering multiple myeloma, history of sick sinus syndrome/heart block with status post pacemaker placement, DM2 and HTN presents by EMS from SNF with altered mentation (Pt bib EMS after staff called for Respiratory distress. Staff reported to EMS that pt's O2 sats on R/A were in the 60's and that pt was more altered than usual. EMS states that they placed pt on 10L NRB and sats improved to 94-96%. Temp per EMS was 102.6 axillary and pt was given 1gm Rocephin  as well as approximately 700ml NS). Chest xray shows: Retrocardiac atelectasis or infiltrate. Pt recently had the flu. He is a long term care resident at [facility]. BSE completed on 03/25/24 with pt  placed NPO except medications. ST f/u for diet progression/dysphagia tx with recommendations for Dysphagia 1/nectar-thickened liquids.      SLP Plan  Continue with current plan of care        Swallow Evaluation Recommendations   Recommendations: PO diet PO Diet Recommendation: Dysphagia 1 (Pureed);Mildly thick liquids (Level 2, nectar thick) Liquid Administration via: Spoon Medication Administration: Crushed with puree Supervision: Full supervision/cueing for swallowing strategies;Full assist for feeding Postural changes: Position pt fully upright for meals;Stay upright 30-60 min after meals Oral care recommendations: Staff/trained caregiver to provide oral care;Oral care BID (2x/day) Caregiver Recommendations: Have oral suction available     Recommendations   ST will f/u for dysphagia tx/education during acute stay and potential objective assessment prn.                  Oral care BID;Staff/trained caregiver to provide oral care   Frequent or constant Supervision/Assistance Dysphagia, unspecified (R13.10)     Continue with current plan of care     Pat Jay Campbell,M.S.,CCC-SLP  03/27/2024, 2:17 PM

## 2024-03-27 NOTE — Progress Notes (Signed)
 " PROGRESS NOTE  Jay Campbell, is a 79 y.o. male, DOB - [birth year], FMW:[id number]  Admit date - 03/24/2024   Admitting Physician Courage Pearlean, MD  Outpatient Primary MD for the patient is [name], MD  LOS - 3  Chief Complaint  Patient presents with   Code Sepsis   Altered Mental Status   Shortness of Breath      Brief Narrative:  79 y.o. male with medical history significant  for dementia, CAD, afib (on Eliquis ), gout, anxiety, smoldering multiple myeloma, history of sick sinus syndrome/heart block with status post pacemaker placement, DM2 and HTN admitted on 03/24/2018/with sepsis secondary to pneumonia leading to acute hypoxic respiratory failure, dehydration AKI and hypernatremia  -Assessment and Plan: 1)Sepsis secondary to Pneumonia--CAP---POA - Chest x-ray with retrocardiac infiltrate--- suspect post influenza pneumonia --COVID flu and RSV negative this admission -WBC 3.0 similar to prior, patient with history of leukopenia -Lactic acid is 2.6 >>2.6 >>3.4 >>2.5 --Fever resolving, mentation improving ---c/n IV Rocephin , Doxycycline , mucolytics and bronchodilators --Continue aggressive IV fluids   2)Acute hypoxic respiratory failure--- initially required nonrebreather bag - Hypoxia Resolved - Patient weaned off oxygen  completely at this time  --Reduce IV Solu-Medrol  to once daily from every 12 --Continue to treat pneumonia as above #1   3)Possible UTI--- IV Rocephin  as above #1 pending culture data   4)Dehydration and Hypernatremia---with metabolic acidosis --sodium remains elevated at 156 BUN/creatinine ratio greater than 30 -- Suspect prerenal azotemia and dehydration in setting of altered mentation with poor oral intake --Elevated sodium persist---Will adjust IV fluids - Serial chemistries   5)AKI----acute kidney injury on CKD stage -3B with metabolic acidosis - Creatinine is 1.96, baseline creatinine usually 1.4-1.6 -BUN elevated at 60 baseline usually  around 30 -Continue IV fluids as above #4 --Hold lisinopril and metformin -renally adjust medications, avoid nephrotoxic agents / dehydration  / hypotension   6)Acute metabolic encephalopathy--- due to #1, #2 #3 and #4 above --Superimposed on underlying dementia -Hold Depakote , hold Zoloft , hold Actos, hold Atarax, hold lorazepam , hold Aricept  --Patient is more awake , he is able to take some oral intake at this time -- Manage as above   7)Chronic Pancytopenia--- in the setting of smoldering multiple myeloma WBC 3.0 similar to prior, patient with history of leukopenia - Hemoglobin 9.8 similar to prior patient has history of chronic anemia - Platelets 68 patient has history of chronic thrombocytopenia -- -Hold apixaban  due to concerns about increased risk of bleeding -Anticipate that Hgb will drop with IV hydration   8)Afib/ history of sick sinus syndrome/heart block with status post pacemaker placement -Continue metoprolol  for rate control - Discontinued Eliquis  given thrombocytopenia, especially in the setting of renal dysfunction with increased risk of bleeding from Eliquis    9)DM2--A1c 7.3 reflecting uncontrolled DM with hyperglycemia PTA --Hold 70/30 insulin , hold metformin and glipizide -Use Novolog /Humalog Sliding scale insulin  with Accu-Cheks/Fingersticks as ordered    10)GERD--continue Protonix    11)BPH with LUTs--continue Flomax    12)Lt Heel Pressure Injury--POA -- Wound care consult appreciated, recommends Cleanse heel wound with saline, pat dry, Cover entire area with single layer of xeroform and top with foam dressing Offload at all times with prevalon boot  13)Social/Ethics---Discussed with pt's daughter Ms. Delon Delapp--at [phone]--- -Per Daughter he can walk,  on a good day he walks at times from his room to the dinning room with a Roback/Rolator----at times he uses a wheelchair, rather than ambulating - He often requires one-on-one sitter at [facility] due to confusion/agitation/behavioral disturbance  issues  Status is: Inpatient  Disposition: The patient is from: SNF              Anticipated d/c is to: SNF              Anticipated d/c date is: 3 days              Patient currently is not medically stable to d/c. Barriers: Not Clinically Stable-   Code Status :  -  Code Status: Limited: Do not attempt resuscitation (DNR) -DNR-LIMITED -Do Not Intubate/DNI    Family Communication:   -Discussed with pt's daughter Ms. Delon Delapp--at [phone]--  DVT Prophylaxis  :   - SCDs   Place and maintain sequential compression device Start: 03/24/24 1940 SCDs Start: 03/24/24 0828 Place TED hose Start: 03/24/24 0828  Lab Results  Component Value Date   PLT 34 (L) 03/26/2024   Inpatient Medications  Scheduled Meds:  allopurinol   100 mg Oral Daily   Chlorhexidine  Gluconate Cloth  6 each Topical Q0600   doxycycline   100 mg Oral Q12H   ferrous sulfate   325 mg Oral Q breakfast   guaiFENesin   15 mL Oral Q12H   insulin  aspart  0-5 Units Subcutaneous QHS   insulin  aspart  0-9 Units Subcutaneous TID WC   insulin  glargine  10 Units Subcutaneous Daily   ipratropium-albuterol   3 mL Nebulization TID   latanoprost   1 drop Both Eyes QHS   methylPREDNISolone  (SOLU-MEDROL ) injection  40 mg Intravenous Daily   metoprolol  tartrate  25 mg Oral BID   mupirocin  ointment   Nasal BID   mouth rinse  15 mL Mouth Rinse 4 times per day   pantoprazole   40 mg Oral Daily   sodium chloride  flush  3 mL Intravenous Q12H   sodium chloride  flush  3 mL Intravenous Q12H   tamsulosin   0.4 mg Oral Daily   Continuous Infusions:  sodium chloride  100 mL/hr at 03/27/24 1739   cefTRIAXone  (ROCEPHIN )  IV Stopped (03/27/24 1000)   PRN Meds:.acetaminophen  **OR** acetaminophen , albuterol , antiseptic oral rinse, bisacodyl , ondansetron  **OR** ondansetron  (ZOFRAN ) IV, mouth rinse, polyethylene glycol, sodium chloride  flush   Anti-infectives (From admission, onward)     Start     Dose/Rate Route Frequency Ordered Stop   03/27/24 2200  doxycycline  (VIBRA -TABS) tablet 100 mg        100 mg Oral Every 12 hours 03/27/24 1054     03/25/24 1000  doxycycline  (VIBRAMYCIN ) 100 mg in sodium chloride  0.9 % 250 mL IVPB  Status:  Discontinued        100 mg 125 mL/hr over 120 Minutes Intravenous Every 12 hours 03/24/24 0822 03/27/24 1054   03/25/24 0800  cefTRIAXone  (ROCEPHIN ) 2 g in sodium chloride  0.9 % 100 mL IVPB        2 g 200 mL/hr over 30 Minutes Intravenous Every 24 hours 03/24/24 0822 03/30/24 0759   03/24/24 0700  cefTRIAXone  (ROCEPHIN ) 1 g in sodium chloride  0.9 % 100 mL IVPB        1 g 200 mL/hr over 30 Minutes Intravenous  Once 03/24/24 0656 03/24/24 0801   03/24/24 0700  azithromycin  (ZITHROMAX ) 500 mg in sodium chloride  0.9 % 250 mL IVPB        500 mg 250 mL/hr over 60 Minutes Intravenous  Once 03/24/24 0656 03/24/24 0838   03/24/24 0700  cefTRIAXone  (ROCEPHIN ) 1 g in sodium chloride  0.9 % 100 mL IVPB  Status:  Discontinued  1 g 200 mL/hr over 30 Minutes Intravenous  Once 03/24/24 0656 03/24/24 9343      Subjective: Prentiss Finder no chest pain, no nausea, no vomiting.  Patient is afebrile and not requiring oxygen  supplementation.  Chronically ill in appearance.  Overall improving.  Objective: Vitals:   03/27/24 1500 03/27/24 1546 03/27/24 1600 03/27/24 1700  BP: 136/88  (!) 155/57 (!) 148/66  Pulse: 63  63 62  Resp: 19  16 19   Temp:  98 F (36.7 C)    TempSrc:  Axillary    SpO2: 96%  94% 94%  Weight:      Height:        Intake/Output Summary (Last 24 hours) at 03/27/2024 1807 Last data filed at 03/27/2024 1551 Gross per 24 hour  Intake 3077.21 ml  Output 500 ml  Net 2577.21 ml   Filed Weights   03/24/24 0657 03/24/24 1000  Weight: 79 kg 73.3 kg    Physical Exam General exam: Alert, awake, oriented x 2; demonstrating saturation on room air; afebrile and in no acute distress. Respiratory system: Improved air movement  bilaterally; positive rhonchi; no using accessory muscles. Cardiovascular system: Rate controlled, no rubs, no gallops, no JVD. Gastrointestinal system: Abdomen is nondistended, soft and nontender. No organomegaly or masses felt. Normal bowel sounds heard. Central nervous system: Moving 4 limbs spontaneously.  No focal neurological deficits. Extremities: No cyanosis or clubbing. Skin: No petechiae.  Left heel decubitus ulcer unchanged with Prevalon boots in place. Psychiatry: Judgement and insight appear to be impaired with underlying history of cognitive impairment.  Stable mood.. Mood & affect appropriate.   Data Reviewed: I have personally reviewed following labs and imaging studies  CBC: Recent Labs  Lab 03/24/24 0700 03/26/24 0324  WBC 3.0* 2.1*  NEUTROABS 1.9  --   HGB 9.8* 8.1*  HCT 30.9* 23.8*  MCV 107.3* 103.0*  PLT 68* 34*   Basic Metabolic Panel: Recent Labs  Lab 03/24/24 2022 03/25/24 0347 03/25/24 1630 03/26/24 0324 03/26/24 1524 03/26/24 2017 03/27/24 0237 03/27/24 0658  NA 156* 156* 157* 155* 159* 157* 157* 157*  K 4.5 4.5 4.1 3.9 4.2  --  4.8  --   CL 127* 127* 129* 120* 126*  --  125*  --   CO2 15* 18* 15* 23 19*  --  21*  --   GLUCOSE 331* 337* 289* 273* 272*  --  260*  --   BUN 64* 62* 69* 66* 76*  --  76*  --   CREATININE 1.89* 1.65* 1.77* 1.54* 1.74*  --  1.66*  --   CALCIUM 7.9* 7.8* 8.1* 7.6* 8.3*  --  8.1*  --   MG  --  1.8  --   --   --   --   --   --   PHOS 2.3*  --  2.3*  --   --   --  3.7  --    GFR: Estimated Creatinine Clearance: 30.9 mL/min (A) (by C-G formula based on SCr of 1.66 mg/dL (H)).  Liver Function Tests: Recent Labs  Lab 03/24/24 0700 03/24/24 2022 03/25/24 1630 03/27/24 0237  AST 15  --   --   --   ALT <5  --   --   --   ALKPHOS 90  --   --   --   BILITOT 0.8  --   --   --   PROT 5.9*  --   --   --   ALBUMIN 2.6*  2.8* 2.7* 2.3*   HbA1C: No results for input(s): HGBA1C in the last 72 hours.   Recent Results  (from the past 240 hours)  Blood Culture (routine x 2)     Status: None (Preliminary result)   Collection Time: 03/24/24  7:00 AM   Specimen: BLOOD LEFT FOREARM  Result Value Ref Range Status   Specimen Description   Final    BLOOD LEFT FOREARM BOTTLES DRAWN AEROBIC AND ANAEROBIC   Special Requests Blood Culture adequate volume  Final   Culture   Final    NO GROWTH 3 DAYS Performed at [facility], [address], [place], [place] [address]    Report Status PENDING  Incomplete  Blood Culture (routine x 2)     Status: None (Preliminary result)   Collection Time: 03/24/24  7:01 AM   Specimen: BLOOD RIGHT ARM  Result Value Ref Range Status   Specimen Description   Final    BLOOD RIGHT ARM BOTTLES DRAWN AEROBIC AND ANAEROBIC   Special Requests Blood Culture adequate volume  Final   Culture   Final    NO GROWTH 3 DAYS Performed at [facility], [address], [place], [place] [address]    Report Status PENDING  Incomplete  Resp panel by RT-PCR (RSV, Flu A&B, Covid) Anterior Nasal Swab     Status: None   Collection Time: 03/24/24  7:12 AM   Specimen: Anterior Nasal Swab  Result Value Ref Range Status   SARS Coronavirus 2 by RT PCR NEGATIVE NEGATIVE Final    Comment: (NOTE) SARS-CoV-2 target nucleic acids are NOT DETECTED.  The SARS-CoV-2 RNA is generally detectable in upper respiratory specimens during the acute phase of infection. The lowest concentration of SARS-CoV-2 viral copies this assay can detect is 138 copies/mL. A negative result does not preclude SARS-Cov-2 infection and should not be used as the sole basis for treatment or other patient management decisions. A negative result may occur with  improper specimen collection/handling, submission of specimen other than nasopharyngeal swab, presence of viral mutation(s) within the areas targeted by this assay, and inadequate number of viral copies(<138 copies/mL). A negative result must be combined with clinical observations,  patient history, and epidemiological information. The expected result is Negative.  Fact Sheet for Patients:  bloggercourse.com  Fact Sheet for Healthcare Providers:  seriousbroker.it  This test is no t yet approved or cleared by the [place]  FDA and  has been authorized for detection and/or diagnosis of SARS-CoV-2 by FDA under an Emergency Use Authorization (EUA). This EUA will remain  in effect (meaning this test can be used) for the duration of the COVID-19 declaration under Section 564(b)(1) of the Act, 21 U.S.C.section 360bbb-3(b)(1), unless the authorization is terminated  or revoked sooner.       Influenza A by PCR NEGATIVE NEGATIVE Final   Influenza B by PCR NEGATIVE NEGATIVE Final    Comment: (NOTE) The Xpert Xpress SARS-CoV-2/FLU/RSV plus assay is intended as an aid in the diagnosis of influenza from Nasopharyngeal swab specimens and should not be used as a sole basis for treatment. Nasal washings and aspirates are unacceptable for Xpert Xpress SARS-CoV-2/FLU/RSV testing.  Fact Sheet for Patients: bloggercourse.com  Fact Sheet for Healthcare Providers: seriousbroker.it  This test is not yet approved or cleared by the [place]  FDA and has been authorized for detection and/or diagnosis of SARS-CoV-2 by FDA under an Emergency Use Authorization (EUA). This EUA will remain in effect (meaning this test can be used) for the  duration of the COVID-19 declaration under Section 564(b)(1) of the Act, 21 U.S.C. section 360bbb-3(b)(1), unless the authorization is terminated or revoked.     Resp Syncytial Virus by PCR NEGATIVE NEGATIVE Final    Comment: (NOTE) Fact Sheet for Patients: bloggercourse.com  Fact Sheet for Healthcare Providers: seriousbroker.it  This test is not yet approved or cleared by the [place]  [place] FDA and has been authorized for detection and/or diagnosis of SARS-CoV-2 by FDA under an Emergency Use Authorization (EUA). This EUA will remain in effect (meaning this test can be used) for the duration of the COVID-19 declaration under Section 564(b)(1) of the Act, 21 U.S.C. section 360bbb-3(b)(1), unless the authorization is terminated or revoked.  Performed at [facility], [address] [place], [place] [address]   MRSA Next Gen by PCR, Nasal     Status: Abnormal   Collection Time: 03/24/24  8:48 AM   Specimen: Nasal Mucosa; Nasal Swab  Result Value Ref Range Status   MRSA by PCR Next Gen DETECTED (A) NOT DETECTED Final    Comment: RESULT CALLED TO, READ BACK BY AND VERIFIED WITH: L HYLTON AT 1711 ON [id number] BY S DALTON (NOTE) The GeneXpert MRSA Assay (FDA approved for NASAL specimens only), is one component of a comprehensive MRSA colonization surveillance program. It is not intended to diagnose MRSA infection nor to guide or monitor treatment for MRSA infections. Test performance is not FDA approved in patients less than 76 years old. Performed at [facility], [address], [place], [place] [address]    Scheduled Meds:  allopurinol   100 mg Oral Daily   Chlorhexidine  Gluconate Cloth  6 each Topical Q0600   doxycycline   100 mg Oral Q12H   ferrous sulfate   325 mg Oral Q breakfast   guaiFENesin   15 mL Oral Q12H   insulin  aspart  0-5 Units Subcutaneous QHS   insulin  aspart  0-9 Units Subcutaneous TID WC   insulin  glargine  10 Units Subcutaneous Daily   ipratropium-albuterol   3 mL Nebulization TID   latanoprost   1 drop Both Eyes QHS   methylPREDNISolone  (SOLU-MEDROL ) injection  40 mg Intravenous Daily   metoprolol  tartrate  25 mg Oral BID   mupirocin  ointment   Nasal BID   mouth rinse  15 mL Mouth Rinse 4 times per day   pantoprazole   40 mg Oral Daily   sodium chloride  flush  3 mL Intravenous Q12H   sodium chloride  flush  3 mL Intravenous Q12H   tamsulosin   0.4  mg Oral Daily   Continuous Infusions:  sodium chloride  100 mL/hr at 03/27/24 1739   cefTRIAXone  (ROCEPHIN )  IV Stopped (03/27/24 1000)    LOS: 3 days   Eric Nunnery M.D on 03/27/2024 at 6:07 PM  Go to [email or web address] - for contact info  [facility] - Office  [phone]  If 7PM-7AM, please contact night-coverage [email or web address] 03/27/2024, 6:07 PM    "

## 2024-03-27 NOTE — Inpatient Diabetes Management (Signed)
 Inpatient Diabetes Program Recommendations  AACE/ADA: New Consensus Statement on Inpatient Glycemic Control   Target Ranges:  Prepandial:   less than 140 mg/dL      Peak postprandial:   less than 180 mg/dL (1-2 hours)      Critically ill patients:  140 - 180 mg/dL    Latest Reference Range & Units 03/26/24 07:17 03/26/24 10:36 03/26/24 15:49 03/26/24 21:14 03/27/24 07:37  Glucose-Capillary 70 - 99 mg/dL 718 (H) 698 (H) 746 (H) 255 (H) 263 (H)   Review of Glycemic Control  Diabetes history: DM2 Outpatient Diabetes medications: Metformin 1000 mg AQM, Humulin 70/30 30 units QAM Current orders for Inpatient glycemic control: Lantus  10 units daily, Novolog  0-9 units TID with meals, Novolog  0-5 units at bedtime; Solumedrol 40 mg daily   Inpatient Diabetes Program Recommendations:     Insulin : CBGs 253-301 mg/dl over the past 24 hours.  If steroids are continued as ordered, please consider increasing Lantus  to 14 units daily and adding Novolog  4 units TID with meals for meal coverage if patient eats at least 50% of meals.  Thanks, Earnie Gainer, RN, MSN, CDCES Diabetes Coordinator Inpatient Diabetes Program [phone] (Team Pager from 8am to 5pm)

## 2024-03-27 NOTE — Progress Notes (Signed)
 At [phone]: Made Dr Ricky aware that RN held mucinex  DM, ferrous sulfate  enteric coated, protonix  EC, and flomax  cap, as none of these can be crushed for patient to take in applesauce.

## 2024-03-28 DIAGNOSIS — I442 Atrioventricular block, complete: Secondary | ICD-10-CM | POA: Diagnosis not present

## 2024-03-28 DIAGNOSIS — J9601 Acute respiratory failure with hypoxia: Secondary | ICD-10-CM | POA: Diagnosis not present

## 2024-03-28 DIAGNOSIS — G9340 Encephalopathy, unspecified: Secondary | ICD-10-CM | POA: Diagnosis not present

## 2024-03-28 DIAGNOSIS — I4821 Permanent atrial fibrillation: Secondary | ICD-10-CM | POA: Diagnosis not present

## 2024-03-28 DIAGNOSIS — E43 Unspecified severe protein-calorie malnutrition: Secondary | ICD-10-CM | POA: Insufficient documentation

## 2024-03-28 LAB — GLUCOSE, CAPILLARY
Glucose-Capillary: 270 mg/dL — ABNORMAL HIGH (ref 70–99)
Glucose-Capillary: 250 mg/dL — ABNORMAL HIGH (ref 70–99)
Glucose-Capillary: 202 mg/dL — ABNORMAL HIGH (ref 70–99)
Glucose-Capillary: 220 mg/dL — ABNORMAL HIGH (ref 70–99)

## 2024-03-28 MED ORDER — INSULIN GLARGINE 100 UNIT/ML ~~LOC~~ SOLN
12.0000 [IU] | Freq: Every day | SUBCUTANEOUS | Status: DC
  Administered 2024-03-28 – 2024-03-31 (×4): 12 [IU] via SUBCUTANEOUS
  Filled 2024-03-28 (×5): qty 0.12

## 2024-03-28 MED ORDER — DEXTROSE 5 % IV SOLN: INTRAVENOUS | Status: DC

## 2024-03-28 MED ORDER — ADULT MULTIVITAMIN W/MINERALS CH
1.0000 | ORAL_TABLET | Freq: Every day | ORAL | Status: DC
  Administered 2024-03-28 – 2024-03-31 (×4): 1 via ORAL
  Filled 2024-03-28 (×4): qty 1

## 2024-03-28 MED ORDER — PREDNISONE 20 MG PO TABS
40.0000 mg | ORAL_TABLET | Freq: Every day | ORAL | Status: DC
  Administered 2024-03-28 – 2024-03-31 (×4): 40 mg via ORAL
  Filled 2024-03-28 (×4): qty 2

## 2024-03-28 NOTE — Inpatient Diabetes Management (Signed)
 Inpatient Diabetes Program Recommendations  AACE/ADA: New Consensus Statement on Inpatient Glycemic Control   Target Ranges:  Prepandial:   less than 140 mg/dL      Peak postprandial:   less than 180 mg/dL (1-2 hours)      Critically ill patients:  140 - 180 mg/dL    Latest Reference Range & Units 03/27/24 07:37 03/27/24 11:21 03/27/24 16:38 03/27/24 21:23 03/27/24 23:03  Glucose-Capillary 70 - 99 mg/dL 736 (H) 728 (H) 761 (H) 310 (H) 298 (H)    Latest Reference Range & Units 03/26/24 07:17 03/26/24 10:36 03/26/24 15:49 03/26/24 21:14  Glucose-Capillary 70 - 99 mg/dL 718 (H) 698 (H) 746 (H) 255 (H)   Review of Glycemic Control  Diabetes history: DM2 Outpatient Diabetes medications: Metformin 1000 mg AQM, Humulin 70/30 30 units QAM Current orders for Inpatient glycemic control: Lantus  10 units daily, Novolog  0-9 units TID with meals, Novolog  0-5 units at bedtime; Solumedrol 40 mg daily   Inpatient Diabetes Program Recommendations:     Insulin : CBGs 238-310 mg/dl over the past 24 hours.  If steroids are continued as ordered, please consider increasing Lantus  to 14 units daily and adding Novolog  4 units TID with meals for meal coverage if patient eats at least 50% of meals.   Thanks, Earnie Gainer, RN, MSN, CDCES Diabetes Coordinator Inpatient Diabetes Program [phone] (Team Pager from 8am to 5pm)

## 2024-03-28 NOTE — Plan of Care (Signed)
  Problem: Coping: Goal: Level of anxiety will decrease Outcome: Progressing   Problem: Pain Managment: Goal: General experience of comfort will improve and/or be controlled Outcome: Progressing   Problem: Education: Goal: Knowledge of General Education information will improve Description: Including pain rating scale, medication(s)/side effects and non-pharmacologic comfort measures Outcome: Not Progressing   Problem: Activity: Goal: Risk for activity intolerance will decrease Outcome: Not Progressing

## 2024-03-28 NOTE — Evaluation (Signed)
 Physical Therapy Evaluation Patient Details Name: Jay Campbell MRN: [id number] DOB: August 17, 1945 Today's Date: 03/28/2024  History of Present Illness  Jay Campbell is a 79 y.o. male with medical history significant  for dementia, CAD, afib (on Eliquis ), gout, anxiety, smoldering multiple myeloma, history of sick sinus syndrome/heart block with status post pacemaker placement, DM2 and HTN presents by EMS from SNF with altered mentation (Pt bib EMS after staff called for Respiratory distress. Staff reported to EMS that pt's O2 sats on R/A were in the 60's and that pt was more altered than usual. EMS states that they placed pt on 10L NRB and sats improved to 94-96%. Temp per EMS was 102.6 axillary and pt was given 1gm Rocephin  as well as approximately 700ml NS)  - Patient is a poor historian due to dementia and altered mentation  - Reported no emesis  - Apparently patient had recent influenza infection  - COVID and influenza RSV negative today  - MRSA PCR positive  - UA suspicious for UTI  - WBC 3.0 similar to prior, patient with history of leukopenia  -Lactic acid is 2.6,  repeat is still  2.6  - Hemoglobin 9.8 similar to prior patient has history of chronic anemia  - Platelets 68 patient has history of chronic thrombocytopenia  - Creatinine is 1.96, baseline creatinine usually 1.4-1.6  -BUN elevated at 60 baseline usually around 30  - Bicarb is 17, anion gap is 14  - Sodium 155, LFTs not elevated  - Chest x-ray with retrocardiac infiltrate.   Clinical Impression  Patient demonstrates slow labored movement for sitting up at bedside, once seated had frequent left lateral and posterior leaning due to poor trunk control. Patient unable to full stand using RW due to BLE weakness and required Max assist stand pivot with knees blocked to transfer to chair. Patient tolerated sitting up in chair after therapy. Patient will benefit from continued skilled physical therapy in hospital and recommended venue  below to increase strength, balance, endurance for safe ADLs and gait.           If plan is discharge home, recommend the following: A lot of help with bathing/dressing/bathroom;A lot of help with walking and/or transfers;Help with stairs or ramp for entrance;Assist for transportation;Assistance with cooking/housework   Can travel by private vehicle   No    Equipment Recommendations None recommended by PT  Recommendations for Other Services       Functional Status Assessment Patient has had a recent decline in their functional status and demonstrates the ability to make significant improvements in function in a reasonable and predictable amount of time.     Precautions / Restrictions Precautions Precautions: Fall Recall of Precautions/Restrictions: Impaired Restrictions Weight Bearing Restrictions Per Provider Order: No      Mobility  Bed Mobility Overal bed mobility: Needs Assistance Bed Mobility: Supine to Sit     Supine to sit: Max assist     General bed mobility comments: slow labored unsteady movement    Transfers Overall transfer level: Needs assistance Equipment used: 1 person hand held assist, Rolling Bas (2 wheels) Transfers: Sit to/from Stand, Bed to chair/wheelchair/BSC Sit to Stand: Max assist Stand pivot transfers: Max assist         General transfer comment: Patient unable to maintain standing balance using RW, required Max assist stand pviot to trasnfer to chair    Ambulation/Gait                  Stairs  Wheelchair Mobility     Tilt Bed    Modified Rankin (Stroke Patients Only)       Balance Overall balance assessment: Needs assistance Sitting-balance support: Feet supported, No upper extremity supported Sitting balance-Leahy Scale: Poor Sitting balance - Comments: fair/poor seated at EOB Postural control: Posterior lean, Right lateral lean Standing balance support: Reliant on assistive device for  balance, During functional activity, Bilateral upper extremity supported Standing balance-Leahy Scale: Poor Standing balance comment: using RW, hand held assist                             Pertinent Vitals/Pain Pain Assessment Pain Assessment: No/denies pain    Home Living Family/patient expects to be discharged to:: Skilled nursing facility                        Prior Function Prior Level of Function : Needs assist       Physical Assist : Mobility (physical);ADLs (physical) Mobility (physical): Bed mobility;Transfers;Gait;Stairs   Mobility Comments: Assisted for transfers, uses WC for mobility ADLs Comments: Assisted by SNF staff     Extremity/Trunk Assessment   Upper Extremity Assessment Upper Extremity Assessment: Generalized weakness    Lower Extremity Assessment Lower Extremity Assessment: Generalized weakness    Cervical / Trunk Assessment Cervical / Trunk Assessment: Kyphotic  Communication   Communication Factors Affecting Communication: Difficulty expressing self;Reduced clarity of speech    Cognition Arousal: Alert Behavior During Therapy: Restless, Flat affect   PT - Cognitive impairments: History of cognitive impairments                         Following commands: Impaired Following commands impaired: Follows one step commands inconsistently     Cueing Cueing Techniques: Verbal cues, Tactile cues, Gestural cues, Visual cues     General Comments      Exercises     Assessment/Plan    PT Assessment Patient needs continued PT services  PT Problem List Decreased strength;Decreased activity tolerance;Decreased balance;Decreased mobility       PT Treatment Interventions DME instruction;Functional mobility training;Therapeutic activities;Therapeutic exercise;Balance training;Wheelchair mobility training;Patient/family education    PT Goals (Current goals can be found in the Care Plan section)  Acute Rehab PT  Goals Patient Stated Goal: return home PT Goal Formulation: With patient Time For Goal Achievement: 04/11/24 Potential to Achieve Goals: Good    Frequency Min 2X/week     Co-evaluation               AM-PAC PT 6 Clicks Mobility  Outcome Measure Help needed turning from your back to your side while in a flat bed without using bedrails?: A Lot Help needed moving from lying on your back to sitting on the side of a flat bed without using bedrails?: A Lot Help needed moving to and from a bed to a chair (including a wheelchair)?: A Lot Help needed standing up from a chair using your arms (e.g., wheelchair or bedside chair)?: A Lot Help needed to walk in hospital room?: Total Help needed climbing 3-5 steps with a railing? : Total 6 Click Score: 10    End of Session Equipment Utilized During Treatment: Oxygen  Activity Tolerance: Patient tolerated treatment well;Patient limited by fatigue Patient left: in chair;with call bell/phone within reach;Other (comment) (mechanical lift sling in place) Nurse Communication: Mobility status PT Visit Diagnosis: Unsteadiness on feet (R26.81);Other abnormalities of gait and mobility (  R26.89);Muscle weakness (generalized) (M62.81)    Time: 1131-1200 PT Time Calculation (min) (ACUTE ONLY): 29 min   Charges:   PT Evaluation $PT Eval Moderate Complexity: 1 Mod PT Treatments $Therapeutic Activity: 23-37 mins PT General Charges $$ ACUTE PT VISIT: 1 Visit         12:19 PM, 03/28/24 Lynwood Music, MPT Physical Therapist with [facility] [phone] office [phone] mobile phone

## 2024-03-28 NOTE — Progress Notes (Signed)
 " PROGRESS NOTE  Jay Campbell, is a 79 y.o. male, DOB - 03-05-1946, FMW:[id number]  Admit date - 03/24/2024   Admitting Physician Courage Pearlean, MD  Outpatient Primary MD for the patient is [name], MD  LOS - 4  Chief Complaint  Patient presents with   Code Sepsis   Altered Mental Status   Shortness of Breath      Brief Narrative:  79 y.o. male with medical history significant  for dementia, CAD, afib (on Eliquis ), gout, anxiety, smoldering multiple myeloma, history of sick sinus syndrome/heart block with status post pacemaker placement, DM2 and HTN admitted on 03/24/2018/with sepsis secondary to pneumonia leading to acute hypoxic respiratory failure, dehydration AKI and hypernatremia  -Assessment and Plan: 1)Sepsis secondary to Pneumonia--CAP---POA - Chest x-ray with retrocardiac infiltrate--- suspect post influenza pneumonia --COVID flu and RSV negative this admission -WBC 3.0 similar to prior, patient with history of leukopenia -Lactic acid is 2.6 >>2.6 >>3.4 >>2.5 --Fever resolving, mentation improving ---c/n IV Rocephin , Doxycycline , mucolytics and bronchodilators --Continue aggressive IV fluids   2)Acute hypoxic respiratory failure--- initially required nonrebreather bag - mild hypoxia has persist; needing 2L [place] supplementation. --continue steroid tapering  --Continue to treat pneumonia as above #1   3)Possible UTI--- IV Rocephin  as above #1 pending culture data   4)Dehydration and Hypernatremia---with metabolic acidosis --sodium remains elevated at 157 BUN/creatinine ratio greater than 30 -- Suspect prerenal azotemia and dehydration in setting of altered mentation with poor oral intake --Elevated sodium persist---Will provide d5W overnight and follow electrolyte trend intermittently.   5)AKI----acute kidney injury on CKD stage -3B with metabolic acidosis - Creatinine is 1.96, baseline creatinine usually 1.4-1.6 -BUN elevated at 60 baseline usually around  30 -Continue IV fluids as above #4 --Hold lisinopril and metformin -renally adjust medications, avoid nephrotoxic agents / dehydration  / hypotension   6)Acute metabolic encephalopathy--- due to #1, #2 #3 and #4 above --Superimposed on underlying dementia -Hold Depakote, hold Zoloft , hold Actos, hold Atarax, hold lorazepam, hold Aricept  --Patient is more awake , he is able to take some oral intake at this time -- Manage as above   7)Chronic Pancytopenia--- in the setting of smoldering multiple myeloma WBC 3.0 similar to prior, patient with history of leukopenia - Hemoglobin 9.8 similar to prior patient has history of chronic anemia - Platelets 68 patient has history of chronic thrombocytopenia -- continue to Hold apixaban  due to concerns about increased risk of bleeding -Anticipate that Hgb will drop with IV hydration   8)Afib/ history of sick sinus syndrome/heart block with status post pacemaker placement -Continue metoprolol  for rate control - Discontinued Eliquis  given thrombocytopenia, especially in the setting of renal dysfunction with increased risk of bleeding from Eliquis    9)DM2--A1c 7.3 reflecting uncontrolled DM with hyperglycemia PTA --Hold 70/30 insulin , hold metformin and glipizide -Use Novolog /Humalog Sliding scale insulin  with Accu-Cheks/Fingersticks as ordered    10)GERD--continue Protonix    11)BPH with LUTs--continue Flomax    12)Lt Heel Pressure Injury--POA -- Wound care consult appreciated, recommends Cleanse heel wound with saline, pat dry, Cover entire area with single layer of xeroform and top with foam dressing Offload at all times with prevalon boot  13)Social/Ethics---Discussed with pt's daughter Ms. Delon Delapp--at [phone]--- -Per Daughter he can walk,  on a good day he walks at times from his room to the dinning room with a Holtsclaw/Rolator----at times he uses a wheelchair, rather than ambulating - He often requires one-on-one sitter at [facility] due to confusion/agitation/behavioral disturbance issues  Status is: Inpatient  Disposition: The patient is from: SNF              Anticipated d/c is to: SNF              Anticipated d/c date is: 3 days              Patient currently is not medically stable to d/c. Barriers: Not Clinically Stable-   Code Status :  -  Code Status: Limited: Do not attempt resuscitation (DNR) -DNR-LIMITED -Do Not Intubate/DNI    Family Communication:   -Discussed with pt's daughter Ms. Delon Delapp--at [phone]--  DVT Prophylaxis  :   - SCDs   Place and maintain sequential compression device Start: 03/24/24 1940 SCDs Start: 03/24/24 0828 Place TED hose Start: 03/24/24 0828  Lab Results  Component Value Date   PLT 34 (L) 03/26/2024   Inpatient Medications  Scheduled Meds:  allopurinol   100 mg Oral Daily   Chlorhexidine  Gluconate Cloth  6 each Topical Q0600   doxycycline   100 mg Oral Q12H   ferrous sulfate   325 mg Oral Q breakfast   guaiFENesin   15 mL Oral Q12H   insulin  aspart  0-5 Units Subcutaneous QHS   insulin  aspart  0-9 Units Subcutaneous TID WC   insulin  glargine  12 Units Subcutaneous Daily   ipratropium-albuterol   3 mL Nebulization TID   latanoprost   1 drop Both Eyes QHS   metoprolol  tartrate  25 mg Oral BID   multivitamin with minerals  1 tablet Oral Daily   mupirocin  ointment   Nasal BID   mouth rinse  15 mL Mouth Rinse 4 times per day   pantoprazole   40 mg Oral Daily   predniSONE   40 mg Oral Q breakfast   sodium chloride  flush  3 mL Intravenous Q12H   sodium chloride  flush  3 mL Intravenous Q12H   tamsulosin   0.4 mg Oral Daily   Continuous Infusions:  cefTRIAXone  (ROCEPHIN )  IV Stopped (03/28/24 0809)   dextrose      PRN Meds:.acetaminophen  **OR** acetaminophen , albuterol , antiseptic oral rinse, bisacodyl , ondansetron  **OR** ondansetron  (ZOFRAN ) IV, mouth rinse, polyethylene glycol, sodium chloride  flush   Anti-infectives (From admission, onward)    Start      Dose/Rate Route Frequency Ordered Stop   03/27/24 2200  doxycycline  (VIBRA -TABS) tablet 100 mg        100 mg Oral Every 12 hours 03/27/24 1054     03/25/24 1000  doxycycline  (VIBRAMYCIN ) 100 mg in sodium chloride  0.9 % 250 mL IVPB  Status:  Discontinued        100 mg 125 mL/hr over 120 Minutes Intravenous Every 12 hours 03/24/24 0822 03/27/24 1054   03/25/24 0800  cefTRIAXone  (ROCEPHIN ) 2 g in sodium chloride  0.9 % 100 mL IVPB        2 g 200 mL/hr over 30 Minutes Intravenous Every 24 hours 03/24/24 0822 03/30/24 0759   03/24/24 0700  cefTRIAXone  (ROCEPHIN ) 1 g in sodium chloride  0.9 % 100 mL IVPB        1 g 200 mL/hr over 30 Minutes Intravenous  Once 03/24/24 0656 03/24/24 0801   03/24/24 0700  azithromycin  (ZITHROMAX ) 500 mg in sodium chloride  0.9 % 250 mL IVPB        500 mg 250 mL/hr over 60 Minutes Intravenous  Once 03/24/24 0656 03/24/24 0838   03/24/24 0700  cefTRIAXone  (ROCEPHIN ) 1 g in sodium chloride  0.9 % 100 mL IVPB  Status:  Discontinued        1  g 200 mL/hr over 30 Minutes Intravenous  Once 03/24/24 0656 03/24/24 9343      Subjective: Prentiss Finder no chest pain, no nausea, no vomiting.  Generally weak and chronically ill in appearance.  Demonstrating good saturation on 2 L supplementation.  Intermittent coughing spells appreciated.  No using accessory muscle.  Objective: Vitals:   03/28/24 1300 03/28/24 1600 03/28/24 1610 03/28/24 1700  BP: 133/70 (!) 172/61  98/75  Pulse: 63 66    Resp: (!) 22 (!) 21  18  Temp:   98.5 F (36.9 C)   TempSrc:   Axillary   SpO2: 94% 100%    Weight:      Height:        Intake/Output Summary (Last 24 hours) at 03/28/2024 1859 Last data filed at 03/28/2024 1518 Gross per 24 hour  Intake 130 ml  Output 950 ml  Net -820 ml   Filed Weights   03/24/24 0657 03/24/24 1000  Weight: 79 kg 73.3 kg    Physical Exam General exam: Alert, awake, able to follow commands; requiring 2L [place] supplementation overnight. Respiratory system:  Positive rhonchi bilaterally; no using accessory muscles.  Good saturation on 2 L supplementation appreciated on exam.  No expiratory wheezing heard on auscultation. Cardiovascular system: Rate controlled, no rubs, no gallops, no JVD. Gastrointestinal system: Abdomen is nondistended, soft and nontender. No organomegaly or masses felt. Normal bowel sounds heard. Central nervous system: Generalized weakness; able to move 4 limbs spontaneously.  No focal neurological deficits. Extremities: No cyanosis or clubbing; no edema on exam. Skin: No petechiae.  Left heel decubitus ulcer unchanged, appreciated on exam.  No active drainage.  Prevalon intermittently used. Psychiatry: Judgement and insight appear impaired secondary to underlying cognitive dysfunction.  Data Reviewed: I have personally reviewed following labs and imaging studies  CBC: Recent Labs  Lab 03/24/24 0700 03/26/24 0324  WBC 3.0* 2.1*  NEUTROABS 1.9  --   HGB 9.8* 8.1*  HCT 30.9* 23.8*  MCV 107.3* 103.0*  PLT 68* 34*   Basic Metabolic Panel: Recent Labs  Lab 03/24/24 2022 03/25/24 0347 03/25/24 1630 03/26/24 0324 03/26/24 1524 03/26/24 2017 03/27/24 0237 03/27/24 0658  NA 156* 156* 157* 155* 159* 157* 157* 157*  K 4.5 4.5 4.1 3.9 4.2  --  4.8  --   CL 127* 127* 129* 120* 126*  --  125*  --   CO2 15* 18* 15* 23 19*  --  21*  --   GLUCOSE 331* 337* 289* 273* 272*  --  260*  --   BUN 64* 62* 69* 66* 76*  --  76*  --   CREATININE 1.89* 1.65* 1.77* 1.54* 1.74*  --  1.66*  --   CALCIUM 7.9* 7.8* 8.1* 7.6* 8.3*  --  8.1*  --   MG  --  1.8  --   --   --   --   --   --   PHOS 2.3*  --  2.3*  --   --   --  3.7  --    GFR: Estimated Creatinine Clearance: 30.9 mL/min (A) (by C-G formula based on SCr of 1.66 mg/dL (H)).  Liver Function Tests: Recent Labs  Lab 03/24/24 0700 03/24/24 2022 03/25/24 1630 03/27/24 0237  AST 15  --   --   --   ALT <5  --   --   --   ALKPHOS 90  --   --   --   BILITOT 0.8  --   --   --  PROT 5.9*  --   --   --   ALBUMIN 2.6* 2.8* 2.7* 2.3*   HbA1C: No results for input(s): HGBA1C in the last 72 hours.   Recent Results (from the past 240 hours)  Blood Culture (routine x 2)     Status: None (Preliminary result)   Collection Time: 03/24/24  7:00 AM   Specimen: BLOOD LEFT FOREARM  Result Value Ref Range Status   Specimen Description   Final    BLOOD LEFT FOREARM BOTTLES DRAWN AEROBIC AND ANAEROBIC   Special Requests Blood Culture adequate volume  Final   Culture   Final    NO GROWTH 4 DAYS Performed at [facility], [address] [place], [place] [address]    Report Status PENDING  Incomplete  Blood Culture (routine x 2)     Status: None (Preliminary result)   Collection Time: 03/24/24  7:01 AM   Specimen: BLOOD RIGHT ARM  Result Value Ref Range Status   Specimen Description   Final    BLOOD RIGHT ARM BOTTLES DRAWN AEROBIC AND ANAEROBIC   Special Requests Blood Culture adequate volume  Final   Culture   Final    NO GROWTH 4 DAYS Performed at [facility], [address] [place], [place] [address]    Report Status PENDING  Incomplete  Resp panel by RT-PCR (RSV, Flu A&B, Covid) Anterior Nasal Swab     Status: None   Collection Time: 03/24/24  7:12 AM   Specimen: Anterior Nasal Swab  Result Value Ref Range Status   SARS Coronavirus 2 by RT PCR NEGATIVE NEGATIVE Final    Comment: (NOTE) SARS-CoV-2 target nucleic acids are NOT DETECTED.  The SARS-CoV-2 RNA is generally detectable in upper respiratory specimens during the acute phase of infection. The lowest concentration of SARS-CoV-2 viral copies this assay can detect is 138 copies/mL. A negative result does not preclude SARS-Cov-2 infection and should not be used as the sole basis for treatment or other patient management decisions. A negative result may occur with  improper specimen collection/handling, submission of specimen other than nasopharyngeal swab, presence of viral mutation(s) within  the areas targeted by this assay, and inadequate number of viral copies(<138 copies/mL). A negative result must be combined with clinical observations, patient history, and epidemiological information. The expected result is Negative.  Fact Sheet for Patients:  bloggercourse.com  Fact Sheet for Healthcare Providers:  seriousbroker.it  This test is no t yet approved or cleared by the [place]  FDA and  has been authorized for detection and/or diagnosis of SARS-CoV-2 by FDA under an Emergency Use Authorization (EUA). This EUA will remain  in effect (meaning this test can be used) for the duration of the COVID-19 declaration under Section 564(b)(1) of the Act, 21 U.S.C.section 360bbb-3(b)(1), unless the authorization is terminated  or revoked sooner.       Influenza A by PCR NEGATIVE NEGATIVE Final   Influenza B by PCR NEGATIVE NEGATIVE Final    Comment: (NOTE) The Xpert Xpress SARS-CoV-2/FLU/RSV plus assay is intended as an aid in the diagnosis of influenza from Nasopharyngeal swab specimens and should not be used as a sole basis for treatment. Nasal washings and aspirates are unacceptable for Xpert Xpress SARS-CoV-2/FLU/RSV testing.  Fact Sheet for Patients: bloggercourse.com  Fact Sheet for Healthcare Providers: seriousbroker.it  This test is not yet approved or cleared by the [place]  FDA and has been authorized for detection and/or diagnosis of SARS-CoV-2 by FDA under an Emergency Use Authorization (EUA). This  EUA will remain in effect (meaning this test can be used) for the duration of the COVID-19 declaration under Section 564(b)(1) of the Act, 21 U.S.C. section 360bbb-3(b)(1), unless the authorization is terminated or revoked.     Resp Syncytial Virus by PCR NEGATIVE NEGATIVE Final    Comment: (NOTE) Fact Sheet for  Patients: bloggercourse.com  Fact Sheet for Healthcare Providers: seriousbroker.it  This test is not yet approved or cleared by the [place]  FDA and has been authorized for detection and/or diagnosis of SARS-CoV-2 by FDA under an Emergency Use Authorization (EUA). This EUA will remain in effect (meaning this test can be used) for the duration of the COVID-19 declaration under Section 564(b)(1) of the Act, 21 U.S.C. section 360bbb-3(b)(1), unless the authorization is terminated or revoked.  Performed at [facility], [address], [place], [place] [address]   MRSA Next Gen by PCR, Nasal     Status: Abnormal   Collection Time: 03/24/24  8:48 AM   Specimen: Nasal Mucosa; Nasal Swab  Result Value Ref Range Status   MRSA by PCR Next Gen DETECTED (A) NOT DETECTED Final    Comment: RESULT CALLED TO, READ BACK BY AND VERIFIED WITH: L HYLTON AT 1711 ON [id number] BY S DALTON (NOTE) The GeneXpert MRSA Assay (FDA approved for NASAL specimens only), is one component of a comprehensive MRSA colonization surveillance program. It is not intended to diagnose MRSA infection nor to guide or monitor treatment for MRSA infections. Test performance is not FDA approved in patients less than 23 years old. Performed at [facility], [address], [place], [place] [address]    Scheduled Meds:  allopurinol   100 mg Oral Daily   Chlorhexidine  Gluconate Cloth  6 each Topical Q0600   doxycycline   100 mg Oral Q12H   ferrous sulfate   325 mg Oral Q breakfast   guaiFENesin   15 mL Oral Q12H   insulin  aspart  0-5 Units Subcutaneous QHS   insulin  aspart  0-9 Units Subcutaneous TID WC   insulin  glargine  12 Units Subcutaneous Daily   ipratropium-albuterol   3 mL Nebulization TID   latanoprost   1 drop Both Eyes QHS   metoprolol  tartrate  25 mg Oral BID   multivitamin with minerals  1 tablet Oral Daily   mupirocin  ointment   Nasal BID   mouth rinse  15 mL  Mouth Rinse 4 times per day   pantoprazole   40 mg Oral Daily   predniSONE   40 mg Oral Q breakfast   sodium chloride  flush  3 mL Intravenous Q12H   sodium chloride  flush  3 mL Intravenous Q12H   tamsulosin   0.4 mg Oral Daily   Continuous Infusions:  cefTRIAXone  (ROCEPHIN )  IV Stopped (03/28/24 0809)   dextrose       LOS: 4 days   Eric Nunnery M.D on 03/28/2024 at 6:59 PM  Go to [email or web address] - for contact info  [facility] - Office  [phone]  If 7PM-7AM, please contact night-coverage [email or web address] 03/28/2024, 6:59 PM    "

## 2024-03-28 NOTE — Progress Notes (Signed)
 Initial Nutrition Assessment  DOCUMENTATION CODES:  Severe malnutrition in context of chronic illness  INTERVENTION:  Magic cup TID with meals, each supplement provides 290 kcal and 9 grams of protein MVI with minerals daily  NUTRITION DIAGNOSIS:  Severe Malnutrition related to chronic illness (CAD) as evidenced by severe muscle depletion, percent weight loss (12% weight loss within 3 months).  GOAL:  Patient will meet greater than or equal to 90% of their needs  MONITOR:  PO intake, Supplement acceptance  REASON FOR ASSESSMENT:  Rounds   ASSESSMENT:  79 yo male admitted with sepsis d/t CAP, acute respiratory failure. PMH includes CAD, A fib, hiatal hernia, DM, gout, anxiety, HTN, GERD, HLD, hearing disorder, smoldering multiple myeloma, Alzheimer's dementia, pacemaker.  Patient unable to provide any nutrition hx. Nurse tech was feeding him lunch, but he said he has had enough after a few bites.    Patient meets criteria for severe malnutrition, given severe depletion of muscle mass and 12% weight loss within 3 months.  Usual weight: 83 kg (October 2025) Current weight: 73.3 kg  (03/24/24) 12% weight loss within 3 months is severe.  Average Meal Intake: 1/14-1/15: 25-50% intake x 3 recorded meals  Nutritionally Relevant Medications: Scheduled Meds:  allopurinol   100 mg Oral Daily   Chlorhexidine  Gluconate Cloth  6 each Topical Q0600   doxycycline   100 mg Oral Q12H   ferrous sulfate   325 mg Oral Q breakfast   guaiFENesin   15 mL Oral Q12H   insulin  aspart  0-5 Units Subcutaneous QHS   insulin  aspart  0-9 Units Subcutaneous TID WC   insulin  glargine  12 Units Subcutaneous Daily   ipratropium-albuterol   3 mL Nebulization TID   latanoprost   1 drop Both Eyes QHS   metoprolol  tartrate  25 mg Oral BID   multivitamin with minerals  1 tablet Oral Daily   mupirocin  ointment   Nasal BID   mouth rinse  15 mL Mouth Rinse 4 times per day   pantoprazole   40 mg Oral Daily    predniSONE   40 mg Oral Q breakfast   sodium chloride  flush  3 mL Intravenous Q12H   sodium chloride  flush  3 mL Intravenous Q12H   tamsulosin   0.4 mg Oral Daily   Continuous Infusions:  cefTRIAXone  (ROCEPHIN )  IV Stopped (03/28/24 0809)   PRN Meds:.acetaminophen  **OR** acetaminophen , albuterol , antiseptic oral rinse, bisacodyl , ondansetron  **OR** ondansetron  (ZOFRAN ) IV, mouth rinse, polyethylene glycol, sodium chloride  flush  Labs Reviewed: Na 157 CBG ranges from 238-310 mg/dL over the last 24 hours HgbA1c 7.3  NUTRITION - FOCUSED PHYSICAL EXAM: Flowsheet Row Most Recent Value  Orbital Region Moderate depletion  Upper Arm Region No depletion  Thoracic and Lumbar Region Moderate depletion  Buccal Region No depletion  Temple Region Severe depletion  Clavicle Bone Region Moderate depletion  Clavicle and Acromion Bone Region Moderate depletion  Scapular Bone Region Severe depletion  Dorsal Hand Unable to assess  Patellar Region Unable to assess  Anterior Thigh Region Unable to assess  Posterior Calf Region Severe depletion  Edema (RD Assessment) None  Hair Reviewed  Eyes Reviewed  Mouth Reviewed  Skin Reviewed  Nails Unable to assess   Diet Order:   Diet Order             DIET - DYS 1 Room service appropriate? Yes; Fluid consistency: Nectar Thick  Diet effective now                   EDUCATION NEEDS:  No education needs have been identified at this time  Skin:  Skin Assessment: Skin Integrity Issues: Skin Integrity Issues:: DTI DTI: L heel  Last BM:  unknown  Height:  Ht Readings from Last 1 Encounters:  03/24/24 5' 5 (1.651 m)   Weight:  Wt Readings from Last 1 Encounters:  03/24/24 73.3 kg   Ideal Body Weight:  61.8 kg  BMI:  Body mass index is 26.89 kg/m.  Estimated Nutritional Needs:  Kcal:  1800-2000 Protein:  90-110 gm Fluid:  >/= 1.8 L   Suzen HUNT RD, LDN, CNSC Contact via secure chat. If unavailable, use group chat RD  Inpatient.

## 2024-03-28 NOTE — Plan of Care (Signed)
" °  Problem: Acute Rehab PT Goals(only PT should resolve) Goal: Pt Will Go Supine/Side To Sit Outcome: Progressing Flowsheets (Taken 03/28/2024 1221) Pt will go Supine/Side to Sit: with moderate assist Goal: Patient Will Transfer Sit To/From Stand Outcome: Progressing Flowsheets (Taken 03/28/2024 1221) Patient will transfer sit to/from stand: with moderate assist Goal: Pt Will Transfer Bed To Chair/Chair To Bed Outcome: Progressing Flowsheets (Taken 03/28/2024 1221) Pt will Transfer Bed to Chair/Chair to Bed: with mod assist Goal: Pt Will Perform Standing Balance Or Pre-Gait Outcome: Progressing Flowsheets (Taken 03/28/2024 1221) Pt will perform standing balance or pre-gait:  with moderate assist  with bilateral UE support  1-2 min Note: Using RW   12:22 PM, 03/28/24 Lynwood Music, MPT Physical Therapist with [facility] 336 [phone] office [phone] mobile phone  "

## 2024-03-28 NOTE — Progress Notes (Addendum)
 Speech Language Pathology Treatment: Dysphagia  Patient Details Name: Jay Campbell MRN: [id number] DOB: May 19, 1945 Today's Date: 03/28/2024 Time: 1230-1250 SLP Time Calculation (min) (ACUTE ONLY): 20 min  Assessment / Plan / Recommendation Clinical Impression  Recommend continue current diet of Dysphagia 1(puree)/nectar-thickened liquids via tsp with cues to initiate swallow and clear oral cavity prior to next bite/sip with FULL supervision/A with meals required.  ST will f/u for potential objective swallow study and dysphagia tx during acute stay.  Pt seen for a f/u dysphagia tx session with pt upright in chair during tx and mentation improved as he was able to state name/DOB without A.  Pt required min-mod verbal cues for swallow initiation and clearance of oral cavity (reduce oral retention) during intake of puree/nectar-thickened liquids.  Pt exhibited a delayed cough with straw sips of nectar-thickened liquids, but this was eliminated with tsp amounts of the same consistency.  Pt is requiring oxygen  at this time, so deconditioning paired with baseline impaired cognition, pmhx of GERD and respiratory changes d/t recent influenza may precipitate delayed cough. MBS would r/o occurrence, so this may be beneficial next session to objectively assess swallow function prior to d/c.    ST will continue efforts in acute setting.     HPI HPI: Jay Campbell is a 79 y.o. male with medical history significant for dementia, CAD, afib (on Eliquis ), gout, anxiety, smoldering multiple myeloma, history of sick sinus syndrome/heart block with status post pacemaker placement, DM2 and HTN presents by EMS from SNF with altered mentation (Pt bib EMS after staff called for Respiratory distress. Staff reported to EMS that pt's O2 sats on R/A were in the 60's and that pt was more altered than usual. EMS states that they placed pt on 10L NRB and sats improved to 94-96%. Temp per EMS was 102.6 axillary and pt was  given 1gm Rocephin  as well as approximately 700ml NS). Chest xray shows: Retrocardiac atelectasis or infiltrate. Pt recently had the flu. He is a long term care resident at [facility]. BSE completed on 03/25/24 with pt placed NPO except medications. ST f/u for diet progression/dysphagia tx with recommendations for Dysphagia 1/nectar-thickened liquids.      SLP Plan  Continue with current plan of care;Other (Comment) (potential MBS?)        Swallow Evaluation Recommendations   Recommendations: PO diet PO Diet Recommendation: Dysphagia 1 (Pureed);Mildly thick liquids (Level 2, nectar thick) Liquid Administration via: Spoon Medication Administration: Crushed with puree (or whole in puree if unable to crush) Supervision: Full assist for feeding;Full supervision/cueing for swallowing strategies Postural changes: Position pt fully upright for meals Oral care recommendations: Staff/trained caregiver to provide oral care;Oral care BID (2x/day) Caregiver Recommendations: Have oral suction available     Recommendations   Continue current POC with potential for new goals after objective study completed                  Oral care BID;Staff/trained caregiver to provide oral care   Frequent or constant Supervision/Assistance Dysphagia, unspecified (R13.10)     Continue with current plan of care;Other (Comment) (potential MBS?)     Pat Aissatou Fronczak,M.S.,CCC-SLP  03/28/2024, 1:11 PM

## 2024-03-29 DIAGNOSIS — G9340 Encephalopathy, unspecified: Secondary | ICD-10-CM | POA: Diagnosis not present

## 2024-03-29 DIAGNOSIS — N179 Acute kidney failure, unspecified: Secondary | ICD-10-CM | POA: Diagnosis not present

## 2024-03-29 DIAGNOSIS — E860 Dehydration: Secondary | ICD-10-CM | POA: Diagnosis not present

## 2024-03-29 DIAGNOSIS — J9601 Acute respiratory failure with hypoxia: Secondary | ICD-10-CM | POA: Diagnosis not present

## 2024-03-29 DIAGNOSIS — E870 Hyperosmolality and hypernatremia: Secondary | ICD-10-CM | POA: Diagnosis not present

## 2024-03-29 DIAGNOSIS — I442 Atrioventricular block, complete: Secondary | ICD-10-CM | POA: Diagnosis not present

## 2024-03-29 DIAGNOSIS — Z950 Presence of cardiac pacemaker: Secondary | ICD-10-CM | POA: Diagnosis not present

## 2024-03-29 DIAGNOSIS — D472 Monoclonal gammopathy: Secondary | ICD-10-CM | POA: Diagnosis not present

## 2024-03-29 DIAGNOSIS — I4821 Permanent atrial fibrillation: Secondary | ICD-10-CM | POA: Diagnosis not present

## 2024-03-29 LAB — BASIC METABOLIC PANEL WITH GFR
Anion gap: 10 (ref 5–15)
BUN: 64 mg/dL — ABNORMAL HIGH (ref 8–23)
CO2: 23 mmol/L (ref 22–32)
Calcium: 8.1 mg/dL — ABNORMAL LOW (ref 8.9–10.3)
Chloride: 123 mmol/L — ABNORMAL HIGH (ref 98–111)
Creatinine, Ser: 1.35 mg/dL — ABNORMAL HIGH (ref 0.61–1.24)
GFR, Estimated: 53 mL/min — ABNORMAL LOW
Glucose, Bld: 214 mg/dL — ABNORMAL HIGH (ref 70–99)
Potassium: 4 mmol/L (ref 3.5–5.1)
Sodium: 156 mmol/L — ABNORMAL HIGH (ref 135–145)

## 2024-03-29 LAB — CULTURE, BLOOD (ROUTINE X 2)
Culture: NO GROWTH
Culture: NO GROWTH
Special Requests: ADEQUATE
Special Requests: ADEQUATE

## 2024-03-29 LAB — GLUCOSE, CAPILLARY
Glucose-Capillary: 196 mg/dL — ABNORMAL HIGH (ref 70–99)
Glucose-Capillary: 232 mg/dL — ABNORMAL HIGH (ref 70–99)
Glucose-Capillary: 247 mg/dL — ABNORMAL HIGH (ref 70–99)
Glucose-Capillary: 254 mg/dL — ABNORMAL HIGH (ref 70–99)

## 2024-03-29 MED ORDER — TRAZODONE HCL 50 MG PO TABS
50.0000 mg | ORAL_TABLET | Freq: Once | ORAL | Status: AC
  Administered 2024-03-29: 50 mg via ORAL
  Filled 2024-03-29: qty 1

## 2024-03-29 MED ORDER — FREE WATER
100.0000 mL | Freq: Three times a day (TID) | Status: DC
  Administered 2024-03-29 – 2024-03-30 (×4): 100 mL via ORAL

## 2024-03-29 MED ORDER — FREE WATER: 100.0000 mL | Freq: Three times a day (TID) | Status: DC

## 2024-03-29 MED ORDER — DEXTROSE 5 % IV SOLN: INTRAVENOUS | Status: AC

## 2024-03-29 NOTE — Progress Notes (Signed)
 Pt agitated, restless, trying to get out of the bed with IVF infusing. Pt has mitts on and is not redirectable. Pt is also combative. Alert and oriented to self. BP is increased 162/74. On call provider [place] notified and orders followed.

## 2024-03-29 NOTE — Progress Notes (Signed)
 " PROGRESS NOTE  Jay Campbell, is a 79 y.o. male, DOB - March 03, 1946, FMW:[id number]  Admit date - 03/24/2024   Admitting Physician Courage Pearlean, MD  Outpatient Primary MD for the patient is [name], MD  LOS - 5  Chief Complaint  Patient presents with   Code Sepsis   Altered Mental Status   Shortness of Breath      Brief Narrative:  79 y.o. male with medical history significant  for dementia, CAD, afib (on Eliquis ), gout, anxiety, smoldering multiple myeloma, history of sick sinus syndrome/heart block with status post pacemaker placement, DM2 and HTN admitted on 03/24/2018/with sepsis secondary to pneumonia leading to acute hypoxic respiratory failure, dehydration AKI and hypernatremia  -Assessment and Plan: 1)Sepsis secondary to Pneumonia--CAP---POA - Chest x-ray with retrocardiac infiltrate--- suspect post influenza pneumonia --COVID flu and RSV negative this admission -WBC 3.0 similar to prior, patient with history of leukopenia -Lactic acid is 2.6 >>2.6 >>3.4 >>2.5 --Fever resolving, mentation improving ---c/n IV Rocephin , Doxycycline , mucolytics and bronchodilators --Continue aggressive IV fluids   2)Acute hypoxic respiratory failure--- initially required nonrebreather bag - mild hypoxia has persist; needing 2L [place] supplementation. --continue steroid tapering  --Continue to treat pneumonia as above #1   3)Possible UTI--- IV Rocephin  as above #1 pending culture data   4)Dehydration and Hypernatremia---with metabolic acidosis --sodium remains elevated at 156 BUN/creatinine ratio greater than 30 -- Suspect prerenal azotemia and dehydration in setting of altered mentation with poor oral intake -- Sodium level down to 156; free water  has been initiated - Continue to follow electrolytes intermittently.  Goal is for sodium less than 150 prior to discharge.   5)AKI----acute kidney injury on CKD stage -3B with metabolic acidosis - Creatinine is 1.96, baseline creatinine  usually 1.4-1.6 -BUN elevated at 60 baseline usually around 30 -Continue IV fluids as above #4 --Hold lisinopril and metformin -renally adjust medications, avoid nephrotoxic agents / dehydration  / hypotension   6)Acute metabolic encephalopathy--- due to #1, #2 #3 and #4 above --Superimposed on underlying dementia -Hold Depakote , hold Zoloft , hold Actos, hold Atarax, hold lorazepam , hold Aricept  --Patient is more awake , he is able to take some oral intake at this time -- Continue Concentraid mentation - Continue adequate hydration and follow electrolytes.   7)Chronic Pancytopenia--- in the setting of smoldering multiple myeloma WBC 3.0 similar to prior, patient with history of leukopenia - Hemoglobin 9.8 similar to prior patient has history of chronic anemia - Platelets 68 patient has history of chronic thrombocytopenia -- continue to Hold apixaban  due to concerns about increased risk of bleeding -Anticipate that Hgb will drop with IV hydration   8)Afib/ history of sick sinus syndrome/heart block with status post pacemaker placement -Continue metoprolol  for rate control - Discontinued Eliquis  given thrombocytopenia, especially in the setting of renal dysfunction with increased risk of bleeding from Eliquis    9)DM2--A1c 7.3 reflecting uncontrolled DM with hyperglycemia PTA --Hold 70/30 insulin , hold metformin and glipizide -Use Novolog /Humalog Sliding scale insulin  with Accu-Cheks/Fingersticks as ordered    10)GERD--continue Protonix    11)BPH with LUTs--continue Flomax    12)Lt Heel Pressure Injury--POA -- Wound care consult appreciated, recommends Cleanse heel wound with saline, pat dry, Cover entire area with single layer of xeroform and top with foam dressing Offload at all times with prevalon boot  13)Social/Ethics---Discussed with pt's daughter Ms. Delon Delapp--at [phone]--- -Per Daughter he can walk,  on a good day he walks at times from his room to the dinning room  with a [employer]/Rolator----at times he uses  a wheelchair, rather than ambulating - He often requires one-on-one sitter at [facility] due to confusion/agitation/behavioral disturbance issues  Status is: Inpatient  Disposition: The patient is from: SNF              Anticipated d/c is to: SNF              Anticipated d/c date is: 3 days              Patient currently is not medically stable to d/c. Barriers: Not Clinically Stable-   Code Status :  -  Code Status: Limited: Do not attempt resuscitation (DNR) -DNR-LIMITED -Do Not Intubate/DNI    Family Communication:   -Discussed with pt's daughter Ms. Delon Delapp--at [phone]--  DVT Prophylaxis  :   - SCDs   Place and maintain sequential compression device Start: 03/24/24 1940 SCDs Start: 03/24/24 0828 Place TED hose Start: 03/24/24 0828  Lab Results  Component Value Date   PLT 34 (L) 03/26/2024   Inpatient Medications  Scheduled Meds:  allopurinol   100 mg Oral Daily   Chlorhexidine  Gluconate Cloth  6 each Topical Q0600   doxycycline   100 mg Oral Q12H   ferrous sulfate   325 mg Oral Q breakfast   free water   100 mL Oral Q8H   guaiFENesin   15 mL Oral Q12H   insulin  aspart  0-5 Units Subcutaneous QHS   insulin  aspart  0-9 Units Subcutaneous TID WC   insulin  glargine  12 Units Subcutaneous Daily   ipratropium-albuterol   3 mL Nebulization TID   latanoprost   1 drop Both Eyes QHS   metoprolol  tartrate  25 mg Oral BID   multivitamin with minerals  1 tablet Oral Daily   mupirocin  ointment   Nasal BID   mouth rinse  15 mL Mouth Rinse 4 times per day   pantoprazole   40 mg Oral Daily   predniSONE   40 mg Oral Q breakfast   sodium chloride  flush  3 mL Intravenous Q12H   sodium chloride  flush  3 mL Intravenous Q12H   tamsulosin   0.4 mg Oral Daily   Continuous Infusions:  dextrose  50 mL/hr at 03/29/24 0823   PRN Meds:.acetaminophen  **OR** acetaminophen , albuterol , antiseptic oral rinse, bisacodyl , ondansetron  **OR**  ondansetron  (ZOFRAN ) IV, mouth rinse, polyethylene glycol, sodium chloride  flush   Anti-infectives (From admission, onward)    Start     Dose/Rate Route Frequency Ordered Stop   03/27/24 2200  doxycycline  (VIBRA -TABS) tablet 100 mg        100 mg Oral Every 12 hours 03/27/24 1054 04/01/24 0759   03/25/24 1000  doxycycline  (VIBRAMYCIN ) 100 mg in sodium chloride  0.9 % 250 mL IVPB  Status:  Discontinued        100 mg 125 mL/hr over 120 Minutes Intravenous Every 12 hours 03/24/24 0822 03/27/24 1054   03/25/24 0800  cefTRIAXone  (ROCEPHIN ) 2 g in sodium chloride  0.9 % 100 mL IVPB        2 g 200 mL/hr over 30 Minutes Intravenous Every 24 hours 03/24/24 0822 03/29/24 0803   03/24/24 0700  cefTRIAXone  (ROCEPHIN ) 1 g in sodium chloride  0.9 % 100 mL IVPB        1 g 200 mL/hr over 30 Minutes Intravenous  Once 03/24/24 0656 03/24/24 0801   03/24/24 0700  azithromycin  (ZITHROMAX ) 500 mg in sodium chloride  0.9 % 250 mL IVPB        500 mg 250 mL/hr over 60 Minutes Intravenous  Once 03/24/24 0656 03/24/24 9161  03/24/24 0700  cefTRIAXone  (ROCEPHIN ) 1 g in sodium chloride  0.9 % 100 mL IVPB  Status:  Discontinued        1 g 200 mL/hr over 30 Minutes Intravenous  Once 03/24/24 0656 03/24/24 0656      Subjective: Jay Campbell afebrile, no chest pain, no nausea, no vomiting.  Stable vital signs.  Patient following simple commands.  2 L nasal cannula in place.  Objective: Vitals:   03/29/24 1300 03/29/24 1400 03/29/24 1402 03/29/24 1506  BP: (!) 151/63 (!) 149/66  (!) 146/72  Pulse: 63 61  64  Resp: 19 (!) 22  20  Temp:    98.1 F (36.7 C)  TempSrc:    Oral  SpO2: 93% 91% 92% 98%  Weight:      Height:        Intake/Output Summary (Last 24 hours) at 03/29/2024 1834 Last data filed at 03/29/2024 1621 Gross per 24 hour  Intake 935.09 ml  Output 450 ml  Net 485.09 ml   Filed Weights   03/24/24 0657 03/24/24 1000  Weight: 79 kg 73.3 kg    Physical Exam General exam: Alert, awake, able to  follow simple commands; in no acute distress, denies chest pain, no nausea, no vomiting.  Clear nasal cannula in place. Respiratory system: No wheezing, no crackles, no using accessory muscles.  Fair air movement appreciated bilaterally.  Positive scattered rhonchi. Cardiovascular system:RRR. No murmurs, rubs, gallops. Gastrointestinal system: Abdomen is nondistended, soft and nontender. No organomegaly or masses felt. Normal bowel sounds heard. Central nervous system: No focal neurological deficits. Extremities: No cyanosis or clubbing. Skin: No petechiae; left heel pressure injury present at time of admission without signs of acute drainage or superinfection. Psychiatry: Judgement and insight appear impaired secondary to underlying cognitive dysfunction.  Data Reviewed: I have personally reviewed following labs and imaging studies  CBC: Recent Labs  Lab 03/24/24 0700 03/26/24 0324  WBC 3.0* 2.1*  NEUTROABS 1.9  --   HGB 9.8* 8.1*  HCT 30.9* 23.8*  MCV 107.3* 103.0*  PLT 68* 34*   Basic Metabolic Panel: Recent Labs  Lab 03/24/24 2022 03/25/24 0347 03/25/24 1630 03/26/24 0324 03/26/24 1524 03/26/24 2017 03/27/24 0237 03/27/24 0658 03/29/24 0508  NA 156* 156* 157* 155* 159* 157* 157* 157* 156*  K 4.5 4.5 4.1 3.9 4.2  --  4.8  --  4.0  CL 127* 127* 129* 120* 126*  --  125*  --  123*  CO2 15* 18* 15* 23 19*  --  21*  --  23  GLUCOSE 331* 337* 289* 273* 272*  --  260*  --  214*  BUN 64* 62* 69* 66* 76*  --  76*  --  64*  CREATININE 1.89* 1.65* 1.77* 1.54* 1.74*  --  1.66*  --  1.35*  CALCIUM 7.9* 7.8* 8.1* 7.6* 8.3*  --  8.1*  --  8.1*  MG  --  1.8  --   --   --   --   --   --   --   PHOS 2.3*  --  2.3*  --   --   --  3.7  --   --    GFR: Estimated Creatinine Clearance: 38 mL/min (A) (by C-G formula based on SCr of 1.35 mg/dL (H)).  Liver Function Tests: Recent Labs  Lab 03/24/24 0700 03/24/24 2022 03/25/24 1630 03/27/24 0237  AST 15  --   --   --   ALT <5  --    --   --  ALKPHOS 90  --   --   --   BILITOT 0.8  --   --   --   PROT 5.9*  --   --   --   ALBUMIN 2.6* 2.8* 2.7* 2.3*   HbA1C: No results for input(s): HGBA1C in the last 72 hours.  Recent Results (from the past 240 hours)  Blood Culture (routine x 2)     Status: None   Collection Time: 03/24/24  7:00 AM   Specimen: BLOOD LEFT FOREARM  Result Value Ref Range Status   Specimen Description   Final    BLOOD LEFT FOREARM BOTTLES DRAWN AEROBIC AND ANAEROBIC   Special Requests Blood Culture adequate volume  Final   Culture   Final    NO GROWTH 5 DAYS Performed at [facility], [address], [place], [place] [address]    Report Status 03/29/2024 FINAL  Final  Blood Culture (routine x 2)     Status: None   Collection Time: 03/24/24  7:01 AM   Specimen: BLOOD RIGHT ARM  Result Value Ref Range Status   Specimen Description   Final    BLOOD RIGHT ARM BOTTLES DRAWN AEROBIC AND ANAEROBIC   Special Requests Blood Culture adequate volume  Final   Culture   Final    NO GROWTH 5 DAYS Performed at [facility], [address], [place], [place] [address]    Report Status 03/29/2024 FINAL  Final  Resp panel by RT-PCR (RSV, Flu A&B, Covid) Anterior Nasal Swab     Status: None   Collection Time: 03/24/24  7:12 AM   Specimen: Anterior Nasal Swab  Result Value Ref Range Status   SARS Coronavirus 2 by RT PCR NEGATIVE NEGATIVE Final    Comment: (NOTE) SARS-CoV-2 target nucleic acids are NOT DETECTED.  The SARS-CoV-2 RNA is generally detectable in upper respiratory specimens during the acute phase of infection. The lowest concentration of SARS-CoV-2 viral copies this assay can detect is 138 copies/mL. A negative result does not preclude SARS-Cov-2 infection and should not be used as the sole basis for treatment or other patient management decisions. A negative result may occur with  improper specimen collection/handling, submission of specimen other than nasopharyngeal swab, presence of  viral mutation(s) within the areas targeted by this assay, and inadequate number of viral copies(<138 copies/mL). A negative result must be combined with clinical observations, patient history, and epidemiological information. The expected result is Negative.  Fact Sheet for Patients:  bloggercourse.com  Fact Sheet for Healthcare Providers:  seriousbroker.it  This test is no t yet approved or cleared by the [place]  FDA and  has been authorized for detection and/or diagnosis of SARS-CoV-2 by FDA under an Emergency Use Authorization (EUA). This EUA will remain  in effect (meaning this test can be used) for the duration of the COVID-19 declaration under Section 564(b)(1) of the Act, 21 U.S.C.section 360bbb-3(b)(1), unless the authorization is terminated  or revoked sooner.       Influenza A by PCR NEGATIVE NEGATIVE Final   Influenza B by PCR NEGATIVE NEGATIVE Final    Comment: (NOTE) The Xpert Xpress SARS-CoV-2/FLU/RSV plus assay is intended as an aid in the diagnosis of influenza from Nasopharyngeal swab specimens and should not be used as a sole basis for treatment. Nasal washings and aspirates are unacceptable for Xpert Xpress SARS-CoV-2/FLU/RSV testing.  Fact Sheet for Patients: bloggercourse.com  Fact Sheet for Healthcare Providers: seriousbroker.it  This test is not yet approved or cleared by the [place]  [place] FDA and has been authorized for detection and/or diagnosis of SARS-CoV-2 by FDA under an Emergency Use Authorization (EUA). This EUA will remain in effect (meaning this test can be used) for the duration of the COVID-19 declaration under Section 564(b)(1) of the Act, 21 U.S.C. section 360bbb-3(b)(1), unless the authorization is terminated or revoked.     Resp Syncytial Virus by PCR NEGATIVE NEGATIVE Final    Comment: (NOTE) Fact Sheet for  Patients: bloggercourse.com  Fact Sheet for Healthcare Providers: seriousbroker.it  This test is not yet approved or cleared by the [place]  FDA and has been authorized for detection and/or diagnosis of SARS-CoV-2 by FDA under an Emergency Use Authorization (EUA). This EUA will remain in effect (meaning this test can be used) for the duration of the COVID-19 declaration under Section 564(b)(1) of the Act, 21 U.S.C. section 360bbb-3(b)(1), unless the authorization is terminated or revoked.  Performed at [facility], [address] [place], [place] [address]   MRSA Next Gen by PCR, Nasal     Status: Abnormal   Collection Time: 03/24/24  8:48 AM   Specimen: Nasal Mucosa; Nasal Swab  Result Value Ref Range Status   MRSA by PCR Next Gen DETECTED (A) NOT DETECTED Final    Comment: RESULT CALLED TO, READ BACK BY AND VERIFIED WITH: L HYLTON AT 1711 ON [id number] BY S DALTON (NOTE) The GeneXpert MRSA Assay (FDA approved for NASAL specimens only), is one component of a comprehensive MRSA colonization surveillance program. It is not intended to diagnose MRSA infection nor to guide or monitor treatment for MRSA infections. Test performance is not FDA approved in patients less than 56 years old. Performed at [facility], [address], [place], [place] [address]    Scheduled Meds:  allopurinol   100 mg Oral Daily   Chlorhexidine  Gluconate Cloth  6 each Topical Q0600   doxycycline   100 mg Oral Q12H   ferrous sulfate   325 mg Oral Q breakfast   free water   100 mL Oral Q8H   guaiFENesin   15 mL Oral Q12H   insulin  aspart  0-5 Units Subcutaneous QHS   insulin  aspart  0-9 Units Subcutaneous TID WC   insulin  glargine  12 Units Subcutaneous Daily   ipratropium-albuterol   3 mL Nebulization TID   latanoprost   1 drop Both Eyes QHS   metoprolol  tartrate  25 mg Oral BID   multivitamin with minerals  1 tablet Oral Daily   mupirocin  ointment    Nasal BID   mouth rinse  15 mL Mouth Rinse 4 times per day   pantoprazole   40 mg Oral Daily   predniSONE   40 mg Oral Q breakfast   sodium chloride  flush  3 mL Intravenous Q12H   sodium chloride  flush  3 mL Intravenous Q12H   tamsulosin   0.4 mg Oral Daily   Continuous Infusions:  dextrose  50 mL/hr at 03/29/24 0823    LOS: 5 days   Eric Nunnery M.D on 03/29/2024 at 6:34 PM  Go to [email or web address] - for contact info  [facility] - Office  [phone]  If 7PM-7AM, please contact night-coverage [email or web address] 03/29/2024, 6:34 PM    "

## 2024-03-29 NOTE — Plan of Care (Signed)
" °  Problem: Coping: Goal: Level of anxiety will decrease Outcome: Progressing   Problem: Safety: Goal: Ability to remain free from injury will improve Outcome: Progressing   Problem: Coping: Goal: Ability to adjust to condition or change in health will improve Outcome: Progressing   Problem: Education: Goal: Knowledge of General Education information will improve Description: Including pain rating scale, medication(s)/side effects and non-pharmacologic comfort measures Outcome: Not Progressing   Problem: Activity: Goal: Risk for activity intolerance will decrease Outcome: Not Progressing   "

## 2024-03-30 DIAGNOSIS — J9601 Acute respiratory failure with hypoxia: Secondary | ICD-10-CM | POA: Diagnosis not present

## 2024-03-30 DIAGNOSIS — I442 Atrioventricular block, complete: Secondary | ICD-10-CM | POA: Diagnosis not present

## 2024-03-30 DIAGNOSIS — G9340 Encephalopathy, unspecified: Secondary | ICD-10-CM | POA: Diagnosis not present

## 2024-03-30 DIAGNOSIS — I4821 Permanent atrial fibrillation: Secondary | ICD-10-CM | POA: Diagnosis not present

## 2024-03-30 LAB — BASIC METABOLIC PANEL WITH GFR
Anion gap: 10 (ref 5–15)
BUN: 57 mg/dL — ABNORMAL HIGH (ref 8–23)
CO2: 22 mmol/L (ref 22–32)
Calcium: 8.4 mg/dL — ABNORMAL LOW (ref 8.9–10.3)
Chloride: 123 mmol/L — ABNORMAL HIGH (ref 98–111)
Creatinine, Ser: 1.35 mg/dL — ABNORMAL HIGH (ref 0.61–1.24)
GFR, Estimated: 53 mL/min — ABNORMAL LOW
Glucose, Bld: 248 mg/dL — ABNORMAL HIGH (ref 70–99)
Potassium: 4.3 mmol/L (ref 3.5–5.1)
Sodium: 155 mmol/L — ABNORMAL HIGH (ref 135–145)

## 2024-03-30 LAB — GLUCOSE, CAPILLARY
Glucose-Capillary: 229 mg/dL — ABNORMAL HIGH (ref 70–99)
Glucose-Capillary: 298 mg/dL — ABNORMAL HIGH (ref 70–99)
Glucose-Capillary: 304 mg/dL — ABNORMAL HIGH (ref 70–99)
Glucose-Capillary: 372 mg/dL — ABNORMAL HIGH (ref 70–99)
Glucose-Capillary: 362 mg/dL — ABNORMAL HIGH (ref 70–99)

## 2024-03-30 MED ORDER — FREE WATER
125.0000 mL | Freq: Three times a day (TID) | Status: DC
  Administered 2024-03-30 – 2024-03-31 (×3): 125 mL via ORAL

## 2024-03-30 MED ORDER — INSULIN ASPART 100 UNIT/ML IJ SOLN
0.0000 [IU] | INTRAMUSCULAR | Status: DC
  Administered 2024-03-30 (×2): 15 [IU] via SUBCUTANEOUS
  Administered 2024-03-31 (×2): 5 [IU] via SUBCUTANEOUS
  Administered 2024-03-31: 2 [IU] via SUBCUTANEOUS
  Filled 2024-03-30 (×5): qty 1

## 2024-03-30 MED ORDER — DEXTROSE 5 % IV SOLN: INTRAVENOUS | Status: AC

## 2024-03-30 NOTE — Progress Notes (Signed)
"  ° °      CROSS COVER NOTE  NAME: YAMIL OELKE MRN: [id number] DOB : Oct 02, 1945 ATTENDING PHYSICIAN: Ricky Fines, MD    Date of Service   03/30/2024   HPI/Events of Note   [facility] Cross Cover at [facility] Notified by nurse of elevated blood glucose levels with d5 infusion  Interventions   Assessment/Plan:  Insulin  change to SSI moderate dose every 4 hours      Erminio LITTIE Cone NP [facility] Cross Cover 7pm-7am - check amion for availability Pager [phone]  "

## 2024-03-30 NOTE — Plan of Care (Signed)

## 2024-03-30 NOTE — Progress Notes (Signed)
 " PROGRESS NOTE  Jay Campbell, is a 79 y.o. male, DOB - [birth year], FMW:[id number]  Admit date - 03/24/2024   Admitting Physician Courage Pearlean, MD  Outpatient Primary MD for the patient is [name], MD  LOS - 6  Chief Complaint  Patient presents with   Code Sepsis   Altered Mental Status   Shortness of Breath      Brief Narrative:  79 y.o. male with medical history significant  for dementia, CAD, afib (on Eliquis ), gout, anxiety, smoldering multiple myeloma, history of sick sinus syndrome/heart block with status post pacemaker placement, DM2 and HTN admitted on 03/24/2018/with sepsis secondary to pneumonia leading to acute hypoxic respiratory failure, dehydration AKI and hypernatremia  -Assessment and Plan: 1)Sepsis secondary to Pneumonia--CAP---POA - Chest x-ray with retrocardiac infiltrate--- suspect post influenza pneumonia --COVID flu and RSV negative this admission -WBC 3.0 similar to prior, patient with history of leukopenia -Lactic acid is 2.6 >>2.6 >>3.4 >>2.5 --Fever resolving, mentation improving ---c/n IV Rocephin , Doxycycline , mucolytics and bronchodilators --Continue aggressive IV fluids   2)Acute hypoxic respiratory failure--- initially required nonrebreather bag - mild hypoxia has persist; needing 2L [place] supplementation. --continue steroid tapering  --Continue to treat pneumonia as above #1   3)Possible UTI--- IV Rocephin  as above #1 pending culture data   4)Dehydration and Hypernatremia---with metabolic acidosis --sodium remains elevated at 155 BUN/creatinine ratio greater than 30 -- Suspect prerenal azotemia and dehydration in setting of altered mentation with poor oral intake -- Sodium level continues slowly trending down; will continue free water  along with another 24 hours of D5W infusion. - Continue to follow electrolytes intermittently.  Goal is for sodium less than 150 prior to discharge.   5)AKI----acute kidney injury on CKD stage -3B with  metabolic acidosis - Creatinine is 1.96, baseline creatinine usually 1.4-1.6 -BUN elevated at 60 baseline usually around 30 -Continue IV fluids as above #4 --Hold lisinopril and metformin -renally adjust medications, avoid nephrotoxic agents / dehydration  / hypotension   6)Acute metabolic encephalopathy--- due to #1, #2 #3 and #4 above --Superimposed on underlying dementia -Hold Depakote , hold Zoloft , hold Actos, hold Atarax, hold lorazepam , hold Aricept  --Patient is more awake , he is able to take some oral intake at this time -- Continue Concentraid mentation - Continue adequate hydration and follow electrolytes.   7)Chronic Pancytopenia--- in the setting of smoldering multiple myeloma WBC 3.0 similar to prior, patient with history of leukopenia - Hemoglobin 9.8 similar to prior patient has history of chronic anemia - Platelets 68 patient has history of chronic thrombocytopenia -- continue to Hold apixaban  due to concerns about increased risk of bleeding -Anticipate that Hgb will drop with IV hydration   8)Afib/ history of sick sinus syndrome/heart block with status post pacemaker placement -Continue metoprolol  for rate control - Discontinued Eliquis  given thrombocytopenia, especially in the setting of renal dysfunction with increased risk of bleeding from Eliquis    9)DM2--A1c 7.3 reflecting uncontrolled DM with hyperglycemia PTA --Hold 70/30 insulin , hold metformin and glipizide -Use Novolog /Humalog Sliding scale insulin  with Accu-Cheks/Fingersticks as ordered    10)GERD--continue Protonix    11)BPH with LUTs--continue Flomax    12)Lt Heel Pressure Injury--POA -- Wound care consult appreciated, recommends Cleanse heel wound with saline, pat dry, Cover entire area with single layer of xeroform and top with foam dressing Offload at all times with prevalon boot  13)Social/Ethics---Discussed with pt's daughter Ms. Delon Delapp--at [phone]--- -Per Daughter he can walk,  on  a good day he walks at times from his room to the  dinning room with a Wilmeth/Rolator----at times he uses a wheelchair, rather than ambulating - He often requires one-on-one sitter at [facility] due to confusion/agitation/behavioral disturbance issues  Status is: Inpatient  Disposition: The patient is from: SNF              Anticipated d/c is to: SNF              Anticipated d/c date is: 3 days              Patient currently is not medically stable to d/c. Barriers: Not Clinically Stable-   Code Status :  -  Code Status: Limited: Do not attempt resuscitation (DNR) -DNR-LIMITED -Do Not Intubate/DNI    Family Communication:   -Discussed with pt's daughter Ms. Delon Delapp--at [phone]--  DVT Prophylaxis  :   - SCDs   Place and maintain sequential compression device Start: 03/24/24 1940 SCDs Start: 03/24/24 0828 Place TED hose Start: 03/24/24 0828  Lab Results  Component Value Date   PLT 34 (L) 03/26/2024   Inpatient Medications  Scheduled Meds:  allopurinol   100 mg Oral Daily   doxycycline   100 mg Oral Q12H   ferrous sulfate   325 mg Oral Q breakfast   free water   100 mL Oral Q8H   guaiFENesin   15 mL Oral Q12H   insulin  aspart  0-5 Units Subcutaneous QHS   insulin  aspart  0-9 Units Subcutaneous TID WC   insulin  glargine  12 Units Subcutaneous Daily   ipratropium-albuterol   3 mL Nebulization TID   latanoprost   1 drop Both Eyes QHS   metoprolol  tartrate  25 mg Oral BID   multivitamin with minerals  1 tablet Oral Daily   mupirocin  ointment   Nasal BID   mouth rinse  15 mL Mouth Rinse 4 times per day   pantoprazole   40 mg Oral Daily   predniSONE   40 mg Oral Q breakfast   sodium chloride  flush  3 mL Intravenous Q12H   sodium chloride  flush  3 mL Intravenous Q12H   tamsulosin   0.4 mg Oral Daily   Continuous Infusions:   PRN Meds:.acetaminophen  **OR** acetaminophen , albuterol , antiseptic oral rinse, bisacodyl , ondansetron  **OR** ondansetron  (ZOFRAN ) IV, mouth rinse,  polyethylene glycol, sodium chloride  flush   Anti-infectives (From admission, onward)    Start     Dose/Rate Route Frequency Ordered Stop   03/27/24 2200  doxycycline  (VIBRA -TABS) tablet 100 mg        100 mg Oral Every 12 hours 03/27/24 1054 04/01/24 0759   03/25/24 1000  doxycycline  (VIBRAMYCIN ) 100 mg in sodium chloride  0.9 % 250 mL IVPB  Status:  Discontinued        100 mg 125 mL/hr over 120 Minutes Intravenous Every 12 hours 03/24/24 0822 03/27/24 1054   03/25/24 0800  cefTRIAXone  (ROCEPHIN ) 2 g in sodium chloride  0.9 % 100 mL IVPB        2 g 200 mL/hr over 30 Minutes Intravenous Every 24 hours 03/24/24 0822 03/29/24 0803   03/24/24 0700  cefTRIAXone  (ROCEPHIN ) 1 g in sodium chloride  0.9 % 100 mL IVPB        1 g 200 mL/hr over 30 Minutes Intravenous  Once 03/24/24 0656 03/24/24 0801   03/24/24 0700  azithromycin  (ZITHROMAX ) 500 mg in sodium chloride  0.9 % 250 mL IVPB        500 mg 250 mL/hr over 60 Minutes Intravenous  Once 03/24/24 0656 03/24/24 0838   03/24/24 0700  cefTRIAXone  (ROCEPHIN ) 1 g in  sodium chloride  0.9 % 100 mL IVPB  Status:  Discontinued        1 g 200 mL/hr over 30 Minutes Intravenous  Once 03/24/24 0656 03/24/24 0656      Subjective: Jay Campbell no fever, no chest pain, no nausea, no vomiting.  In no acute distress.  Patient chronically ill in appearance.  Able to follow simple commands.  Objective: Vitals:   03/29/24 2259 03/30/24 0233 03/30/24 0820 03/30/24 1355  BP: (!) 162/74 (!) 148/58 131/79 (!) 134/54  Pulse: 64 61 61 65  Resp: 20 20    Temp: 98.6 F (37 C) 98.3 F (36.8 C)  (!) 97.5 F (36.4 C)  TempSrc: Axillary Axillary  Oral  SpO2: 93% 90%  93%  Weight:      Height:        Intake/Output Summary (Last 24 hours) at 03/30/2024 1554 Last data filed at 03/30/2024 1300 Gross per 24 hour  Intake 1041.7 ml  Output 600 ml  Net 441.7 ml   Filed Weights   03/24/24 0657 03/24/24 1000  Weight: 79 kg 73.3 kg    Physical Exam General exam:  Alert, awake, able to follow simple commands; in no acute distress.  Afebrile. Respiratory system: 2 L nasal cannula in place; positive rhonchi bilaterally.  No using accessory muscles. Cardiovascular system: Rate controlled, no rubs, no gallops, no JVD. Gastrointestinal system: Abdomen is nondistended, soft and nontender. No organomegaly or masses felt. Normal bowel sounds heard. Central nervous system: Moving 4 limbs spontaneously.  No focal neurological deficits. Extremities: No cyanosis or clubbing. Skin: No petechiae.  Left heel pressure injury present at time of admission without signs of superimposed infection. Psychiatry: Judgement and insight appear impaired secondary to underlying cognitive dysfunction.  Flat affect.  Data Reviewed: I have personally reviewed following labs and imaging studies  CBC: Recent Labs  Lab 03/24/24 0700 03/26/24 0324  WBC 3.0* 2.1*  NEUTROABS 1.9  --   HGB 9.8* 8.1*  HCT 30.9* 23.8*  MCV 107.3* 103.0*  PLT 68* 34*   Basic Metabolic Panel: Recent Labs  Lab 03/24/24 2022 03/25/24 0347 03/25/24 1630 03/26/24 0324 03/26/24 1524 03/26/24 2017 03/27/24 0237 03/27/24 0658 03/29/24 0508 03/30/24 0357  NA 156* 156* 157* 155* 159* 157* 157* 157* 156* 155*  K 4.5 4.5 4.1 3.9 4.2  --  4.8  --  4.0 4.3  CL 127* 127* 129* 120* 126*  --  125*  --  123* 123*  CO2 15* 18* 15* 23 19*  --  21*  --  23 22  GLUCOSE 331* 337* 289* 273* 272*  --  260*  --  214* 248*  BUN 64* 62* 69* 66* 76*  --  76*  --  64* 57*  CREATININE 1.89* 1.65* 1.77* 1.54* 1.74*  --  1.66*  --  1.35* 1.35*  CALCIUM 7.9* 7.8* 8.1* 7.6* 8.3*  --  8.1*  --  8.1* 8.4*  MG  --  1.8  --   --   --   --   --   --   --   --   PHOS 2.3*  --  2.3*  --   --   --  3.7  --   --   --    GFR: Estimated Creatinine Clearance: 38 mL/min (A) (by C-G formula based on SCr of 1.35 mg/dL (H)).  Liver Function Tests: Recent Labs  Lab 03/24/24 0700 03/24/24 2022 03/25/24 1630 03/27/24 0237  AST 15   --   --   --  ALT <5  --   --   --   ALKPHOS 90  --   --   --   BILITOT 0.8  --   --   --   PROT 5.9*  --   --   --   ALBUMIN 2.6* 2.8* 2.7* 2.3*   HbA1C: No results for input(s): HGBA1C in the last 72 hours.  Recent Results (from the past 240 hours)  Blood Culture (routine x 2)     Status: None   Collection Time: 03/24/24  7:00 AM   Specimen: BLOOD LEFT FOREARM  Result Value Ref Range Status   Specimen Description   Final    BLOOD LEFT FOREARM BOTTLES DRAWN AEROBIC AND ANAEROBIC   Special Requests Blood Culture adequate volume  Final   Culture   Final    NO GROWTH 5 DAYS Performed at [facility], [address], [place], [place] [address]    Report Status 03/29/2024 FINAL  Final  Blood Culture (routine x 2)     Status: None   Collection Time: 03/24/24  7:01 AM   Specimen: BLOOD RIGHT ARM  Result Value Ref Range Status   Specimen Description   Final    BLOOD RIGHT ARM BOTTLES DRAWN AEROBIC AND ANAEROBIC   Special Requests Blood Culture adequate volume  Final   Culture   Final    NO GROWTH 5 DAYS Performed at [facility], [address] [place], [place] [address]    Report Status 03/29/2024 FINAL  Final  Resp panel by RT-PCR (RSV, Flu A&B, Covid) Anterior Nasal Swab     Status: None   Collection Time: 03/24/24  7:12 AM   Specimen: Anterior Nasal Swab  Result Value Ref Range Status   SARS Coronavirus 2 by RT PCR NEGATIVE NEGATIVE Final    Comment: (NOTE) SARS-CoV-2 target nucleic acids are NOT DETECTED.  The SARS-CoV-2 RNA is generally detectable in upper respiratory specimens during the acute phase of infection. The lowest concentration of SARS-CoV-2 viral copies this assay can detect is 138 copies/mL. A negative result does not preclude SARS-Cov-2 infection and should not be used as the sole basis for treatment or other patient management decisions. A negative result may occur with  improper specimen collection/handling, submission of specimen other than  nasopharyngeal swab, presence of viral mutation(s) within the areas targeted by this assay, and inadequate number of viral copies(<138 copies/mL). A negative result must be combined with clinical observations, patient history, and epidemiological information. The expected result is Negative.  Fact Sheet for Patients:  bloggercourse.com  Fact Sheet for Healthcare Providers:  seriousbroker.it  This test is no t yet approved or cleared by the [place]  FDA and  has been authorized for detection and/or diagnosis of SARS-CoV-2 by FDA under an Emergency Use Authorization (EUA). This EUA will remain  in effect (meaning this test can be used) for the duration of the COVID-19 declaration under Section 564(b)(1) of the Act, 21 U.S.C.section 360bbb-3(b)(1), unless the authorization is terminated  or revoked sooner.       Influenza A by PCR NEGATIVE NEGATIVE Final   Influenza B by PCR NEGATIVE NEGATIVE Final    Comment: (NOTE) The Xpert Xpress SARS-CoV-2/FLU/RSV plus assay is intended as an aid in the diagnosis of influenza from Nasopharyngeal swab specimens and should not be used as a sole basis for treatment. Nasal washings and aspirates are unacceptable for Xpert Xpress SARS-CoV-2/FLU/RSV testing.  Fact Sheet for Patients: bloggercourse.com  Fact Sheet for Healthcare Providers: seriousbroker.it  This test is not yet approved or cleared by the [place]  FDA and has been authorized for detection and/or diagnosis of SARS-CoV-2 by FDA under an Emergency Use Authorization (EUA). This EUA will remain in effect (meaning this test can be used) for the duration of the COVID-19 declaration under Section 564(b)(1) of the Act, 21 U.S.C. section 360bbb-3(b)(1), unless the authorization is terminated or revoked.     Resp Syncytial Virus by PCR NEGATIVE NEGATIVE Final    Comment:  (NOTE) Fact Sheet for Patients: bloggercourse.com  Fact Sheet for Healthcare Providers: seriousbroker.it  This test is not yet approved or cleared by the [place]  FDA and has been authorized for detection and/or diagnosis of SARS-CoV-2 by FDA under an Emergency Use Authorization (EUA). This EUA will remain in effect (meaning this test can be used) for the duration of the COVID-19 declaration under Section 564(b)(1) of the Act, 21 U.S.C. section 360bbb-3(b)(1), unless the authorization is terminated or revoked.  Performed at [facility], [address] [place], [place] [address]   MRSA Next Gen by PCR, Nasal     Status: Abnormal   Collection Time: 03/24/24  8:48 AM   Specimen: Nasal Mucosa; Nasal Swab  Result Value Ref Range Status   MRSA by PCR Next Gen DETECTED (A) NOT DETECTED Final    Comment: RESULT CALLED TO, READ BACK BY AND VERIFIED WITH: L HYLTON AT 1711 ON [id number] BY S DALTON (NOTE) The GeneXpert MRSA Assay (FDA approved for NASAL specimens only), is one component of a comprehensive MRSA colonization surveillance program. It is not intended to diagnose MRSA infection nor to guide or monitor treatment for MRSA infections. Test performance is not FDA approved in patients less than 49 years old. Performed at [facility], [address], [place], [place] [address]    Scheduled Meds:  allopurinol   100 mg Oral Daily   doxycycline   100 mg Oral Q12H   ferrous sulfate   325 mg Oral Q breakfast   free water   100 mL Oral Q8H   guaiFENesin   15 mL Oral Q12H   insulin  aspart  0-5 Units Subcutaneous QHS   insulin  aspart  0-9 Units Subcutaneous TID WC   insulin  glargine  12 Units Subcutaneous Daily   ipratropium-albuterol   3 mL Nebulization TID   latanoprost   1 drop Both Eyes QHS   metoprolol  tartrate  25 mg Oral BID   multivitamin with minerals  1 tablet Oral Daily   mupirocin  ointment   Nasal BID   mouth rinse  15 mL  Mouth Rinse 4 times per day   pantoprazole   40 mg Oral Daily   predniSONE   40 mg Oral Q breakfast   sodium chloride  flush  3 mL Intravenous Q12H   sodium chloride  flush  3 mL Intravenous Q12H   tamsulosin   0.4 mg Oral Daily   Continuous Infusions:    LOS: 6 days   Eric Nunnery M.D on 03/30/2024 at 3:54 PM  Go to [email or web address] - for contact info  [facility] - Office  [phone]  If 7PM-7AM, please contact night-coverage [email or web address] 03/30/2024, 3:54 PM    "

## 2024-03-31 DIAGNOSIS — E870 Hyperosmolality and hypernatremia: Secondary | ICD-10-CM | POA: Diagnosis not present

## 2024-03-31 DIAGNOSIS — J9601 Acute respiratory failure with hypoxia: Secondary | ICD-10-CM | POA: Diagnosis not present

## 2024-03-31 DIAGNOSIS — G9340 Encephalopathy, unspecified: Secondary | ICD-10-CM | POA: Diagnosis not present

## 2024-03-31 DIAGNOSIS — E860 Dehydration: Secondary | ICD-10-CM

## 2024-03-31 DIAGNOSIS — D61818 Other pancytopenia: Secondary | ICD-10-CM

## 2024-03-31 DIAGNOSIS — G9341 Metabolic encephalopathy: Secondary | ICD-10-CM

## 2024-03-31 DIAGNOSIS — E43 Unspecified severe protein-calorie malnutrition: Secondary | ICD-10-CM | POA: Diagnosis not present

## 2024-03-31 DIAGNOSIS — R6520 Severe sepsis without septic shock: Secondary | ICD-10-CM

## 2024-03-31 DIAGNOSIS — A419 Sepsis, unspecified organism: Secondary | ICD-10-CM | POA: Diagnosis not present

## 2024-03-31 DIAGNOSIS — Z950 Presence of cardiac pacemaker: Secondary | ICD-10-CM | POA: Diagnosis not present

## 2024-03-31 DIAGNOSIS — I442 Atrioventricular block, complete: Secondary | ICD-10-CM | POA: Diagnosis not present

## 2024-03-31 DIAGNOSIS — I4821 Permanent atrial fibrillation: Secondary | ICD-10-CM | POA: Diagnosis not present

## 2024-03-31 DIAGNOSIS — D472 Monoclonal gammopathy: Secondary | ICD-10-CM | POA: Diagnosis not present

## 2024-03-31 LAB — BASIC METABOLIC PANEL WITH GFR
Anion gap: 11 (ref 5–15)
BUN: 54 mg/dL — ABNORMAL HIGH (ref 8–23)
CO2: 20 mmol/L — ABNORMAL LOW (ref 22–32)
Calcium: 8 mg/dL — ABNORMAL LOW (ref 8.9–10.3)
Chloride: 125 mmol/L — ABNORMAL HIGH (ref 98–111)
Creatinine, Ser: 1.3 mg/dL — ABNORMAL HIGH (ref 0.61–1.24)
GFR, Estimated: 56 mL/min — ABNORMAL LOW
Glucose, Bld: 207 mg/dL — ABNORMAL HIGH (ref 70–99)
Potassium: 3.5 mmol/L (ref 3.5–5.1)
Sodium: 156 mmol/L — ABNORMAL HIGH (ref 135–145)

## 2024-03-31 LAB — GLUCOSE, CAPILLARY
Glucose-Capillary: 208 mg/dL — ABNORMAL HIGH (ref 70–99)
Glucose-Capillary: 134 mg/dL — ABNORMAL HIGH (ref 70–99)
Glucose-Capillary: 230 mg/dL — ABNORMAL HIGH (ref 70–99)

## 2024-03-31 MED ORDER — PREDNISONE 20 MG PO TABS: ORAL_TABLET | ORAL | Status: DC

## 2024-03-31 MED ORDER — PANTOPRAZOLE SODIUM 40 MG PO TBEC: 40.0000 mg | DELAYED_RELEASE_TABLET | Freq: Every day | ORAL | Status: DC

## 2024-03-31 MED ORDER — LORAZEPAM 0.5 MG PO TABS: 0.5000 mg | ORAL_TABLET | Freq: Two times a day (BID) | ORAL | 0 refills | Status: DC

## 2024-03-31 MED ORDER — FREE WATER: 125.0000 mL | Freq: Three times a day (TID) | Status: DC

## 2024-03-31 NOTE — Progress Notes (Signed)
 Spoke with Jay Campbell at [facility] an provided report. EMS called to transport.

## 2024-03-31 NOTE — TOC Transition Note (Signed)
 Transition of Care [facility]) - Discharge Note   Patient Details  Name: Jay Campbell MRN: [id number] Date of Birth: 02-Jun-1945  [facility] CM/SW Contact:  Ronnald MARLA Sil, RN Phone Number: 03/31/2024, 3:55 PM   Clinical Narrative:    Patient is LTC resident of [facility], contacted Admit Dir GLENWOOD Shed  to confirm patient medically ready to transition to SNF today, was provided Rm 503, and contact # [phone] for Nurse report, Shed denied need for renewed FL-2 just requested inclusion of DC Summary in transfer Packet.  CM completed Medical Necessity form for Ambulance transport and printed document to 3rd Floor.  CM also notified RN Daphne of aforementioned, contacted [facility] to request Transport, and contacted Son GLENWOOD Mott with update on DC, reached his voicemail and left msg.  No additional needs identified at this time, however CM will continue to follow along to ensure all DC arrangements are complete.    Final next level of care: Skilled Nursing Facility Barriers to Discharge: No Barriers Identified  Patient Goals and CMS Choice Patient states their goals for this hospitalization and ongoing recovery are:: Return to [facility] CMS Medicare.gov Compare Post Acute Care list provided to:: Patient Choice offered to / list presented to : Patient  Discharge Placement Patient to be transferred to facility by: [facility] Name of family member notified: Son - Zameer Borman Patient and family notified of of transfer: 03/31/24  Discharge Plan and Services Additional resources added to the After Visit Summary for   In-house Referral: Clinical Social Work Discharge Planning Services: CM Consult Post Acute Care Choice: Nursing Home          DME Arranged: N/A DME Agency: NA  Social Drivers of Health (SDOH) Interventions SDOH Screenings   Food Insecurity: Patient Unable To Answer (03/24/2024)  Housing: Patient Unable To Answer (03/24/2024)  Transportation  Needs: Patient Unable To Answer (03/24/2024)  Utilities: Patient Unable To Answer (03/24/2024)  Financial Resource Strain: Low Risk (07/01/2023)   Received from [facility]  Social Connections: Patient Unable To Answer (03/24/2024)  Tobacco Use: Low Risk (03/24/2024)     Readmission Risk Interventions    03/31/2024    3:53 PM 03/25/2024    9:58 AM  Readmission Risk Prevention Plan  Transportation Screening Complete Complete  Medication Review Oceanographer) Complete Complete  HRI or Home Care Consult  Complete  SW Recovery Care/Counseling Consult Complete Complete  Palliative Care Screening  Not Applicable  Skilled Nursing Facility Complete Complete

## 2024-03-31 NOTE — Discharge Summary (Signed)
 " Physician Discharge Summary   Patient: Jay Campbell MRN: [id number] DOB: 30-Jul-1945  Admit date:     03/24/2024  Discharge date: 03/31/24  Discharge Physician: Jay Campbell   PCP: Jay Isles, MD   Recommendations at discharge:  Repeat basic metabolic panel to follow electrolytes and renal function Reassess CBGs/A1c with further adjustment to hypoglycemic regimen as needed Repeat chest x-ray in 6-8 weeks to assure resolution of infiltrates.  Discharge Diagnoses: Principal Problem:   Acute hypoxic respiratory failure (HCC) Active Problems:   Smoldering multiple myeloma   Permanent atrial fibrillation (HCC)   Cardiac pacemaker in situ   S/P AV nodal ablation   Complete heart block (HCC)   Acute metabolic encephalopathy   Protein-calorie malnutrition, severe   Dehydration   Pancytopenia (HCC)   Hypernatremia   Sepsis with acute hypoxic respiratory failure without septic shock (HCC)  Brief Narrative:  79 y.o. male with medical history significant  for dementia, CAD, afib (on Eliquis ), gout, anxiety, smoldering multiple myeloma, history of sick sinus syndrome/heart block with status post pacemaker placement, DM2 and HTN admitted on 03/24/2018/with sepsis secondary to pneumonia leading to acute hypoxic respiratory failure, dehydration AKI and hypernatremia    Assessment and Plan: 1)Sepsis secondary to Pneumonia--CAP---POA - Chest x-ray with retrocardiac infiltrate--- suspect post influenza pneumonia --COVID flu and RSV negative this admission -WBC 3.0 similar to prior, patient with history of leukopenia -Lactic acid is 2.6 >>2.6 >>3.4 >>2.5 --Fever resolving, mentation improving ---c/n IV Rocephin , Doxycycline , mucolytics and bronchodilators --Continue aggressive IV fluids   2)Acute hypoxic respiratory failure--- initially required nonrebreather bag - mild hypoxia has persist; needing 2L [name] supplementation. --continue steroid tapering  --Continue to treat pneumonia as  above #1   3)Possible UTI--- IV Rocephin  as above #1 pending culture data   4)Dehydration and Hypernatremia---with metabolic acidosis --sodium remains elevated at 155-156 (which most likely is patient's baseline sodium level due to chronic free water  deficit.  -BUN/creatinine ratio greater than 30 -- Suspect prerenal azotemia and dehydration in setting of altered mentation with poor oral intake -- Sodium level continues slowly trending down; will continue free water  along with another 24 hours of D5W infusion. - Continue to follow electrolytes intermittently.  Goal is for sodium less than 150 prior to discharge.   5)AKI----acute kidney injury on CKD stage -3B with metabolic acidosis - Creatinine is 1.96, baseline creatinine usually 1.4-1.6 -BUN elevated at 60 baseline usually around 30 -Continue to maintain adequate hydration -- Continue to hold metformin -renally adjust medications, avoid nephrotoxic agents / dehydration  / hypotension -Patient's creatinine 1.30 at time of discharge.   6)Acute metabolic encephalopathy--- due to #1, #2 #3 and #4 above --Superimposed on underlying dementia -Hold Depakote , hold Zoloft , hold Actos, hold Atarax, hold lorazepam , hold Aricept  --Patient is more awake , he is able to take some oral intake at this time -- Continue Concentraid mentation - Continue adequate hydration and follow electrolytes.   7)Chronic Pancytopenia--- in the setting of smoldering multiple myeloma WBC 3.0 similar to prior, patient with history of leukopenia - Hemoglobin 9.8 similar to prior patient has history of chronic anemia - Platelets 68 patient has history of chronic thrombocytopenia -- continue to Hold apixaban  due to concerns about increased risk of bleeding - Continue to follow hemoglobin trend intermittently.   8)Afib/ history of sick sinus syndrome/heart block with status post pacemaker placement -Continue metoprolol  for rate control - Discontinued Eliquis  given  thrombocytopenia, especially in the setting of renal dysfunction with increased risk of bleeding from  Eliquis    9)DM2--A1c 7.3 reflecting uncontrolled DM with hyperglycemia PTA --Hold 70/30 insulin , hold metformin and glipizide -Use Novolog /Humalog Sliding scale insulin  with Accu-Cheks/Fingersticks as ordered    10)GERD--continue Protonix    11)BPH with LUTs--continue Flomax    12)Lt Heel Pressure Injury--POA -- Wound care consult appreciated, recommends Cleanse heel wound with saline, pat dry, Cover entire area with single layer of xeroform and top with foam dressing Offload at all times with prevalon boot   13)Social/Ethics---Discussed with pt's daughter Ms. Jay Campbell--at [phone]--- -Per Daughter he can walk,  on a good day he walks at times from his room to the dinning room with a Brophy/Rolator----at times he uses a wheelchair, rather than ambulating - He often requires one-on-one sitter at [facility] due to confusion/agitation/behavioral disturbance issues  Consultants: None Procedures performed: See below for x-ray reports. Disposition: Skilled nursing facility (long-term resident. Diet recommendation: Dysphagia 1 diet with nectar thick liquids.  DISCHARGE MEDICATION: Allergies as of 03/31/2024       Reactions   Contrast Media [iodinated Contrast Media] Other (See Comments)   No reaction listed on MAR   Vioxx [rofecoxib]    On pt MAR   Shellfish Allergy Itching, Rash   Sulfonamide Derivatives Itching, Rash        Medication List     STOP taking these medications    Eliquis  2.5 MG Tabs tablet Generic drug: apixaban    isosorbide  mononitrate 60 MG 24 hr tablet Commonly known as: IMDUR    lisinopril 30 MG tablet Commonly known as: ZESTRIL   metFORMIN 1000 MG tablet Commonly known as: GLUCOPHAGE       TAKE these medications    acetaminophen  650 MG CR tablet Commonly known as: TYLENOL  Take 650 mg by mouth every 8 (eight) hours as needed for  pain.   allopurinol  100 MG tablet Commonly known as: ZYLOPRIM  Take 100 mg by mouth in the morning.   ARIPiprazole  5 MG tablet Commonly known as: ABILIFY  Take 5 mg by mouth every morning.   ascorbic acid 500 MG tablet Commonly known as: VITAMIN C Take 500 mg by mouth 2 (two) times daily.   BD Insulin  Syringe U/F 31G X 5/16 0.3 ML Misc Generic drug: Insulin  Syringe-Needle U-100   BOOST GLUCOSE CONTROL PO Take 237 mLs by mouth 2 (two) times daily between meals.   Calcium-Vitamins C & D 500-10-250 MG-MG-UNIT Chew Chew 1 tablet by mouth 2 (two) times daily.   cetirizine  10 MG tablet Commonly known as: ZYRTEC  Take 10 mg by mouth every morning.   divalproex  125 MG capsule Commonly known as: DEPAKOTE  SPRINKLE Take 125 mg by mouth daily in the afternoon. 1400   divalproex  125 MG capsule Commonly known as: DEPAKOTE  SPRINKLE Take 250 mg by mouth 2 (two) times daily. 0800 and 2000   donepezil  10 MG tablet Commonly known as: ARICEPT  Take 1 tablet (10 mg total) by mouth at bedtime.   famotidine  20 MG tablet Commonly known as: PEPCID  Take 20 mg by mouth 2 (two) times daily.   feeding supplement (PRO-STAT 64) Liqd Take 30 mLs by mouth in the morning.   ferrous sulfate  325 (65 FE) MG EC tablet Take 325 mg by mouth 3 (three) times daily.   free water  Soln Take 125 mLs by mouth every 8 (eight) hours.   FreeStyle Libre 14 Day Sensor Misc   guaifenesin  400 MG Tabs tablet Commonly known as: HUMIBID E Take 400 mg by mouth every 12 (twelve) hours.   HumuLIN 70/30 (70-30) 100 UNIT/ML  injection Generic drug: insulin  NPH-regular Human Inject 30 Units into the skin in the morning.   latanoprost  0.005 % ophthalmic solution Commonly known as: XALATAN  Place 1 drop into both eyes at bedtime.   LORazepam  0.5 MG tablet Commonly known as: ATIVAN  Take 1 tablet (0.5 mg total) by mouth 2 (two) times daily.   memantine  10 MG tablet Commonly known as: NAMENDA  TAKE 1 TABLET BY MOUTH  TWICE  DAILY   metoprolol  tartrate 25 MG tablet Commonly known as: LOPRESSOR  Take 25 mg by mouth 2 (two) times daily.   multivitamin tablet Take 1 tablet by mouth in the morning.   OneTouch Verio test strip Generic drug: glucose blood   pantoprazole  40 MG tablet Commonly known as: PROTONIX  Take 1 tablet (40 mg total) by mouth daily. Start taking on: April 01, 2024   predniSONE  20 MG tablet Commonly known as: DELTASONE  Take 2 tablets by mouth daily x 1 day; then 1 tablet by mouth daily x 2 days; then half tablet by mouth daily x 3 days and stop prednisone . Start taking on: April 01, 2024   simvastatin  40 MG tablet Commonly known as: ZOCOR  Take 40 mg by mouth every evening.   tamsulosin  0.4 MG Caps capsule Commonly known as: FLOMAX  Take 0.4 mg by mouth every evening.   Zinc 220 (50 Zn) MG Caps Take 1 capsule by mouth every morning.               Discharge Care Instructions  (From admission, onward)           Start     Ordered   03/31/24 0000  Discharge wound care:       Comments: Cleanse heel wound with saline, pat dry Cover entire area with single layer of xeroform and top with foam dressing Offload at all times with prevalon boot   03/31/24 1349            Follow-up Information     Jay Isles, MD. Schedule an appointment as soon as possible for a visit in 10 day(s).   Specialty: Internal Medicine Contact information: [address]  [place] [address] [phone]                Discharge Exam: Fredricka Weights   03/24/24 0657 03/24/24 1000  Weight: 79 kg 73.3 kg    General exam: Alert, awake, able to follow simple commands; in no acute distress.  Afebrile. Respiratory system: 2 L nasal cannula in place; positive rhonchi bilaterally.  No using accessory muscles. Cardiovascular system: Rate controlled, no rubs, no gallops, no JVD. Gastrointestinal system: Abdomen is nondistended, soft and nontender. No organomegaly or masses felt.  Normal bowel sounds heard. Central nervous system: Moving 4 limbs spontaneously.  No focal neurological deficits. Extremities: No cyanosis or clubbing. Skin: No petechiae.  Left heel pressure injury present at time of admission without signs of superimposed infection. Psychiatry: Judgement and insight appear impaired secondary to underlying cognitive dysfunction.  Flat affect.  Condition at discharge: stable  The results of significant diagnostics from this hospitalization (including imaging, microbiology, ancillary and laboratory) are listed below for reference.   Imaging Studies: DG Chest Port 1 View Result Date: 03/24/2024 CLINICAL DATA:  Respiratory distress. EXAM: PORTABLE CHEST 1 VIEW COMPARISON:  03/03/2024 FINDINGS: The cardio pericardial silhouette is enlarged. Retrocardiac atelectasis or infiltrate noted. Right lung clear. Single lead left-sided permanent pacemaker again noted. Telemetry leads overlie the chest. IMPRESSION: Retrocardiac atelectasis or infiltrate. Electronically Signed   By: Camellia  Minus M.D.   On: 03/24/2024 07:22   CT PELVIS WO CONTRAST Result Date: 03/03/2024 CLINICAL DATA:  Recent fall with findings suspicious for pubic rami fractures on the left on recent plain film EXAM: CT PELVIS WITHOUT CONTRAST TECHNIQUE: Multidetector CT imaging of the pelvis was performed following the standard protocol without intravenous contrast. RADIATION DOSE REDUCTION: This exam was performed according to the departmental dose-optimization program which includes automated exposure control, adjustment of the mA and/or kV according to patient size and/or use of iterative reconstruction technique. COMPARISON:  Plain film from earlier in the same day. FINDINGS: Urinary Tract:  Bladder is decompressed. Bowel: Scattered fecal material is noted throughout the colon. No obstructive changes are noted. Vascular/Lymphatic: Atherosclerotic calcifications of aorta are noted without aneurysmal  dilatation. Reproductive:  Prostate is within normal limits. Other:  No free pelvic fluid is noted. Musculoskeletal: Changes consistent with prior superior and inferior pubic rami fractures bilaterally with healing are seen. No acute displaced fracture is noted at this time. Proximal femurs show degenerative changes of hip joints without acute abnormality. Pelvic ring is otherwise within normal limits. IMPRESSION: Chronic pubic rami fractures bilaterally. No acute abnormality noted. Electronically Signed   By: Oneil Devonshire M.D.   On: 03/03/2024 22:24   DG Chest 1 View Result Date: 03/03/2024 CLINICAL DATA:  Fall EXAM: DG CHEST 1V COMPARISON:  12/15/2023 FINDINGS: Left-sided single lead pacing device. Cardiomegaly with vascular congestion. No sizable pleural effusion or focal airspace disease. Aortic atherosclerosis IMPRESSION: Cardiomegaly with vascular congestion. Electronically Signed   By: Luke Bun M.D.   On: 03/03/2024 21:55   DG Hip Unilat W or Wo Pelvis 2-3 Views Left Result Date: 03/03/2024 CLINICAL DATA:  Unwitnessed fall EXAM: DG HIP (WITH OR WITHOUT PELVIS) 2-3V LEFT COMPARISON:  CT 02/07/2023, bone survey 11/17/2022 FINDINGS: SI joints are non widened. Chronic appearing right superior and inferior pubic rami fractures. Possible nondisplaced left superior and inferior pubic rami fractures of uncertain age. Mild degenerative changes of left hip. IMPRESSION: 1. Possible nondisplaced left superior and inferior pubic rami fractures of uncertain age. Suggest correlation with CT 2. Chronic appearing right superior and inferior pubic rami fractures. Electronically Signed   By: Luke Bun M.D.   On: 03/03/2024 21:54   CT Thoracic Spine Wo Contrast Result Date: 03/03/2024 CLINICAL DATA:  Unwitnessed fall with back pain EXAM: CT THORACIC SPINE WITHOUT CONTRAST TECHNIQUE: Multidetector CT images of the thoracic were obtained using the standard protocol without intravenous contrast. RADIATION DOSE  REDUCTION: This exam was performed according to the departmental dose-optimization program which includes automated exposure control, adjustment of the mA and/or kV according to patient size and/or use of iterative reconstruction technique. COMPARISON:  CT 02/07/2023 FINDINGS: Alignment: Mild dextrocurvature. No subluxation on sagittal reconstruction. Vertebrae: Chronic mild superior endplate deformity at T5. Remaining vertebra in the thoracic spine demonstrate normal stature. Diffuse confluent osteophytosis of the thoracic spine. No abnormal disc space widening. Paraspinal and other soft tissues: No acute paravertebral or paraspinal soft tissue abnormality. Aortic atherosclerosis. Right kidney stone. Disc levels: Diffuse osteophytosis of the spine. No high-grade canal stenosis. IMPRESSION: 1. No acute osseous abnormality of the thoracic spine. 2. Chronic mild superior endplate deformity at T5. Aortic Atherosclerosis (ICD10-I70.0). Electronically Signed   By: Luke Bun M.D.   On: 03/03/2024 21:44   CT Lumbar Spine Wo Contrast Result Date: 03/03/2024 CLINICAL DATA:  Un witnessed fall with back pain, initial encounter EXAM: CT LUMBAR SPINE WITHOUT CONTRAST TECHNIQUE: Multidetector CT imaging of the lumbar spine  was performed without intravenous contrast administration. Multiplanar CT image reconstructions were also generated. RADIATION DOSE REDUCTION: This exam was performed according to the departmental dose-optimization program which includes automated exposure control, adjustment of the mA and/or kV according to patient size and/or use of iterative reconstruction technique. COMPARISON:  02/07/2023 FINDINGS: Segmentation: 5 lumbar type vertebral bodies are well visualized. Alignment: Alignment is well maintained. Vertebrae: Vertebral plane is again noted at L1 with stable retropulsion of the posterior wall and increased kyphosis. No new compression deformity is seen. Multilevel osteophytic changes are noted  worst at L2-3 and L3-4. Facet hypertrophic changes are identified. No pars defects are noted. Paraspinal and other soft tissues: Staghorn calculus is noted in the upper pole of the right kidney measuring up to 17 mm stable in appearance from the prior exam. Atherosclerotic calcifications are of the aorta are noted without aneurysmal dilatation. The remainder of the paraspinal soft tissues appear within normal limits. IMPRESSION: Stable L1 compression deformity with increased kyphosis. Multilevel degenerative change without acute abnormality. Right-sided staghorn calculus stable from the prior study. Electronically Signed   By: Oneil Devonshire M.D.   On: 03/03/2024 21:32   CT Cervical Spine Wo Contrast Result Date: 03/03/2024 EXAM: CT CERVICAL SPINE WITHOUT CONTRAST 03/03/2024 09:20:44 PM TECHNIQUE: CT of the cervical spine was performed without the administration of intravenous contrast. Multiplanar reformatted images are provided for review. Automated exposure control, iterative reconstruction, and/or weight based adjustment of the mA/kV was utilized to reduce the radiation dose to as low as reasonably achievable. COMPARISON: Comparison with 12/24/2023. CLINICAL HISTORY: fall fall FINDINGS: BONES AND ALIGNMENT: No acute fracture or traumatic malalignment. DEGENERATIVE CHANGES: No change in multilevel spondylosis and facet arthropathy. No severe spinal canal narrowing. SOFT TISSUES: No prevertebral soft tissue swelling. IMPRESSION: 1. No evidence of acute traumatic injury. Electronically signed by: Norman Gatlin MD 03/03/2024 09:30 PM EST RP Workstation: [id number]   CT Head Wo Contrast Result Date: 03/03/2024 EXAM: CT HEAD WITHOUT CONTRAST 03/03/2024 09:20:44 PM TECHNIQUE: CT of the head was performed without the administration of intravenous contrast. Automated exposure control, iterative reconstruction, and/or weight based adjustment of the mA/kV was utilized to reduce the radiation dose to as low as  reasonably achievable. COMPARISON: 12/13/2023 CLINICAL HISTORY: fall FINDINGS: BRAIN AND VENTRICLES: No acute hemorrhage. No evidence of acute infarct. No hydrocephalus. No extra-axial collection. No mass effect or midline shift. Diffuse cerebral volume loss. Scattered hypoattenuation foci in periventricular and subcortical white matter, consistent with chronic microvascular ischemic changes. Calcified atherosclerotic plaque in cavernous/supraclinoid internal carotid arteries. ORBITS: Bilateral lens replacement. SINUSES: No acute abnormality. SOFT TISSUES AND SKULL: No acute soft tissue abnormality. No skull fracture. IMPRESSION: 1. No acute intracranial abnormality. Electronically signed by: Norman Gatlin MD 03/03/2024 09:27 PM EST RP Workstation: [id number]    Microbiology: Results for orders placed or performed during the hospital encounter of 03/24/24  Blood Culture (routine x 2)     Status: None   Collection Time: 03/24/24  7:00 AM   Specimen: BLOOD LEFT FOREARM  Result Value Ref Range Status   Specimen Description   Final    BLOOD LEFT FOREARM BOTTLES DRAWN AEROBIC AND ANAEROBIC   Special Requests Blood Culture adequate volume  Final   Culture   Final    NO GROWTH 5 DAYS Performed at [facility], [address], [place], [place] [address]    Report Status 03/29/2024 FINAL  Final  Blood Culture (routine x 2)     Status: None   Collection Time: 03/24/24  7:01 AM   Specimen: BLOOD RIGHT ARM  Result Value Ref Range Status   Specimen Description   Final    BLOOD RIGHT ARM BOTTLES DRAWN AEROBIC AND ANAEROBIC   Special Requests Blood Culture adequate volume  Final   Culture   Final    NO GROWTH 5 DAYS Performed at [facility], [address], [place], [place] [address]    Report Status 03/29/2024 FINAL  Final  Resp panel by RT-PCR (RSV, Flu A&B, Covid) Anterior Nasal Swab     Status: None   Collection Time: 03/24/24  7:12 AM   Specimen: Anterior Nasal Swab  Result Value Ref Range  Status   SARS Coronavirus 2 by RT PCR NEGATIVE NEGATIVE Final    Comment: (NOTE) SARS-CoV-2 target nucleic acids are NOT DETECTED.  The SARS-CoV-2 RNA is generally detectable in upper respiratory specimens during the acute phase of infection. The lowest concentration of SARS-CoV-2 viral copies this assay can detect is 138 copies/mL. A negative result does not preclude SARS-Cov-2 infection and should not be used as the sole basis for treatment or other patient management decisions. A negative result may occur with  improper specimen collection/handling, submission of specimen other than nasopharyngeal swab, presence of viral mutation(s) within the areas targeted by this assay, and inadequate number of viral copies(<138 copies/mL). A negative result must be combined with clinical observations, patient history, and epidemiological information. The expected result is Negative.  Fact Sheet for Patients:  bloggercourse.com  Fact Sheet for Healthcare Providers:  seriousbroker.it  This test is no t yet approved or cleared by the [place]  FDA and  has been authorized for detection and/or diagnosis of SARS-CoV-2 by FDA under an Emergency Use Authorization (EUA). This EUA will remain  in effect (meaning this test can be used) for the duration of the COVID-19 declaration under Section 564(b)(1) of the Act, 21 U.S.C.section 360bbb-3(b)(1), unless the authorization is terminated  or revoked sooner.       Influenza A by PCR NEGATIVE NEGATIVE Final   Influenza B by PCR NEGATIVE NEGATIVE Final    Comment: (NOTE) The Xpert Xpress SARS-CoV-2/FLU/RSV plus assay is intended as an aid in the diagnosis of influenza from Nasopharyngeal swab specimens and should not be used as a sole basis for treatment. Nasal washings and aspirates are unacceptable for Xpert Xpress SARS-CoV-2/FLU/RSV testing.  Fact Sheet for  Patients: bloggercourse.com  Fact Sheet for Healthcare Providers: seriousbroker.it  This test is not yet approved or cleared by the [place]  FDA and has been authorized for detection and/or diagnosis of SARS-CoV-2 by FDA under an Emergency Use Authorization (EUA). This EUA will remain in effect (meaning this test can be used) for the duration of the COVID-19 declaration under Section 564(b)(1) of the Act, 21 U.S.C. section 360bbb-3(b)(1), unless the authorization is terminated or revoked.     Resp Syncytial Virus by PCR NEGATIVE NEGATIVE Final    Comment: (NOTE) Fact Sheet for Patients: bloggercourse.com  Fact Sheet for Healthcare Providers: seriousbroker.it  This test is not yet approved or cleared by the [place]  FDA and has been authorized for detection and/or diagnosis of SARS-CoV-2 by FDA under an Emergency Use Authorization (EUA). This EUA will remain in effect (meaning this test can be used) for the duration of the COVID-19 declaration under Section 564(b)(1) of the Act, 21 U.S.C. section 360bbb-3(b)(1), unless the authorization is terminated or revoked.  Performed at [facility], [address] [place], [place] [address]   MRSA Next Gen by PCR,  Nasal     Status: Abnormal   Collection Time: 03/24/24  8:48 AM   Specimen: Nasal Mucosa; Nasal Swab  Result Value Ref Range Status   MRSA by PCR Next Gen DETECTED (A) NOT DETECTED Final    Comment: RESULT CALLED TO, READ BACK BY AND VERIFIED WITH: L HYLTON AT 1711 ON [id number] BY S DALTON (NOTE) The GeneXpert MRSA Assay (FDA approved for NASAL specimens only), is one component of a comprehensive MRSA colonization surveillance program. It is not intended to diagnose MRSA infection nor to guide or monitor treatment for MRSA infections. Test performance is not FDA approved in patients less than 53 years old. Performed  at [facility], [address], [place], [place] [address]     Labs: CBC: Recent Labs  Lab 03/26/24 0324  WBC 2.1*  HGB 8.1*  HCT 23.8*  MCV 103.0*  PLT 34*   Basic Metabolic Panel: Recent Labs  Lab 03/24/24 2022 03/25/24 0347 03/25/24 1630 03/26/24 0324 03/26/24 1524 03/26/24 2017 03/27/24 0237 03/27/24 0658 03/29/24 0508 03/30/24 0357 03/31/24 0431  NA 156* 156* 157*   < > 159*   < > 157* 157* 156* 155* 156*  K 4.5 4.5 4.1   < > 4.2  --  4.8  --  4.0 4.3 3.5  CL 127* 127* 129*   < > 126*  --  125*  --  123* 123* 125*  CO2 15* 18* 15*   < > 19*  --  21*  --  23 22 20*  GLUCOSE 331* 337* 289*   < > 272*  --  260*  --  214* 248* 207*  BUN 64* 62* 69*   < > 76*  --  76*  --  64* 57* 54*  CREATININE 1.89* 1.65* 1.77*   < > 1.74*  --  1.66*  --  1.35* 1.35* 1.30*  CALCIUM 7.9* 7.8* 8.1*   < > 8.3*  --  8.1*  --  8.1* 8.4* 8.0*  MG  --  1.8  --   --   --   --   --   --   --   --   --   PHOS 2.3*  --  2.3*  --   --   --  3.7  --   --   --   --    < > = values in this interval not displayed.   Liver Function Tests: Recent Labs  Lab 03/24/24 2022 03/25/24 1630 03/27/24 0237  ALBUMIN 2.8* 2.7* 2.3*   CBG: Recent Labs  Lab 03/30/24 2024 03/30/24 2320 03/31/24 0336 03/31/24 0741 03/31/24 1145  GLUCAP 372* 362* 208* 134* 230*    Discharge time spent:  35 minutes.  Signed: Eric Nunnery, MD [facility] 03/31/2024 "

## 2024-03-31 NOTE — Progress Notes (Addendum)
 Pt Blood sugars improved with Q4 CBGs and moderate ss insulin . 0400 cbg 208 from 362. Pt is not tolerating the oral free water  and is refusing at least 50%. Pt is also coughing with water  intake. Urine output 350 ml for entire shift. RN did mouth care swabbed his mouth and offered PO intake every hour due to decreased intake. On call provider is aware.

## 2024-03-31 NOTE — Plan of Care (Signed)

## 2024-04-01 ENCOUNTER — Encounter (HOSPITAL_COMMUNITY): Payer: Self-pay | Admitting: Internal Medicine

## 2024-04-01 ENCOUNTER — Other Ambulatory Visit (HOSPITAL_COMMUNITY): Payer: Self-pay | Admitting: *Deleted

## 2024-04-01 ENCOUNTER — Other Ambulatory Visit: Payer: Self-pay

## 2024-04-01 ENCOUNTER — Inpatient Hospital Stay (HOSPITAL_COMMUNITY)
Admission: EM | Admit: 2024-04-01 | Discharge: 2024-04-03 | DRG: 640 | Disposition: A | Discharge status: Other Acute Inpatient Hospital | Source: Skilled Nursing Facility | Attending: Internal Medicine | Admitting: Internal Medicine

## 2024-04-01 ENCOUNTER — Inpatient Hospital Stay (HOSPITAL_COMMUNITY)

## 2024-04-01 ENCOUNTER — Emergency Department (HOSPITAL_COMMUNITY)

## 2024-04-01 DIAGNOSIS — I700 Atherosclerosis of aorta: Secondary | ICD-10-CM | POA: Diagnosis present

## 2024-04-01 DIAGNOSIS — I2510 Atherosclerotic heart disease of native coronary artery without angina pectoris: Secondary | ICD-10-CM | POA: Diagnosis present

## 2024-04-01 DIAGNOSIS — J188 Other pneumonia, unspecified organism: Principal | ICD-10-CM | POA: Diagnosis present

## 2024-04-01 DIAGNOSIS — I5032 Chronic diastolic (congestive) heart failure: Secondary | ICD-10-CM | POA: Diagnosis present

## 2024-04-01 DIAGNOSIS — L89616 Pressure-induced deep tissue damage of right heel: Secondary | ICD-10-CM | POA: Diagnosis present

## 2024-04-01 DIAGNOSIS — J189 Pneumonia, unspecified organism: Secondary | ICD-10-CM | POA: Diagnosis present

## 2024-04-01 DIAGNOSIS — R188 Other ascites: Secondary | ICD-10-CM | POA: Diagnosis present

## 2024-04-01 DIAGNOSIS — J9811 Atelectasis: Secondary | ICD-10-CM | POA: Diagnosis present

## 2024-04-01 DIAGNOSIS — N179 Acute kidney failure, unspecified: Secondary | ICD-10-CM | POA: Diagnosis present

## 2024-04-01 DIAGNOSIS — F0284 Dementia in other diseases classified elsewhere, unspecified severity, with anxiety: Secondary | ICD-10-CM | POA: Diagnosis present

## 2024-04-01 DIAGNOSIS — Z8619 Personal history of other infectious and parasitic diseases: Secondary | ICD-10-CM

## 2024-04-01 DIAGNOSIS — R54 Age-related physical debility: Secondary | ICD-10-CM | POA: Diagnosis present

## 2024-04-01 DIAGNOSIS — L89626 Pressure-induced deep tissue damage of left heel: Secondary | ICD-10-CM | POA: Diagnosis present

## 2024-04-01 DIAGNOSIS — D509 Iron deficiency anemia, unspecified: Secondary | ICD-10-CM | POA: Diagnosis present

## 2024-04-01 DIAGNOSIS — R627 Adult failure to thrive: Secondary | ICD-10-CM | POA: Diagnosis present

## 2024-04-01 DIAGNOSIS — E860 Dehydration: Secondary | ICD-10-CM | POA: Diagnosis not present

## 2024-04-01 DIAGNOSIS — Z9889 Other specified postprocedural states: Secondary | ICD-10-CM

## 2024-04-01 DIAGNOSIS — N200 Calculus of kidney: Secondary | ICD-10-CM | POA: Diagnosis present

## 2024-04-01 DIAGNOSIS — Z66 Do not resuscitate: Secondary | ICD-10-CM | POA: Diagnosis present

## 2024-04-01 DIAGNOSIS — I130 Hypertensive heart and chronic kidney disease with heart failure and stage 1 through stage 4 chronic kidney disease, or unspecified chronic kidney disease: Secondary | ICD-10-CM | POA: Diagnosis present

## 2024-04-01 DIAGNOSIS — J9611 Chronic respiratory failure with hypoxia: Secondary | ICD-10-CM | POA: Diagnosis present

## 2024-04-01 DIAGNOSIS — N400 Enlarged prostate without lower urinary tract symptoms: Secondary | ICD-10-CM | POA: Diagnosis present

## 2024-04-01 DIAGNOSIS — K5730 Diverticulosis of large intestine without perforation or abscess without bleeding: Secondary | ICD-10-CM | POA: Diagnosis present

## 2024-04-01 DIAGNOSIS — Z515 Encounter for palliative care: Secondary | ICD-10-CM

## 2024-04-01 DIAGNOSIS — D61818 Other pancytopenia: Secondary | ICD-10-CM | POA: Diagnosis present

## 2024-04-01 DIAGNOSIS — E870 Hyperosmolality and hypernatremia: Secondary | ICD-10-CM | POA: Diagnosis present

## 2024-04-01 DIAGNOSIS — N1832 Chronic kidney disease, stage 3b: Secondary | ICD-10-CM | POA: Diagnosis present

## 2024-04-01 DIAGNOSIS — Z6829 Body mass index (BMI) 29.0-29.9, adult: Secondary | ICD-10-CM

## 2024-04-01 DIAGNOSIS — E872 Acidosis, unspecified: Secondary | ICD-10-CM | POA: Diagnosis present

## 2024-04-01 DIAGNOSIS — G309 Alzheimer's disease, unspecified: Secondary | ICD-10-CM | POA: Diagnosis present

## 2024-04-01 DIAGNOSIS — I998 Other disorder of circulatory system: Secondary | ICD-10-CM | POA: Diagnosis present

## 2024-04-01 DIAGNOSIS — I5031 Acute diastolic (congestive) heart failure: Secondary | ICD-10-CM

## 2024-04-01 DIAGNOSIS — I4821 Permanent atrial fibrillation: Secondary | ICD-10-CM | POA: Diagnosis present

## 2024-04-01 DIAGNOSIS — D472 Monoclonal gammopathy: Secondary | ICD-10-CM | POA: Diagnosis present

## 2024-04-01 DIAGNOSIS — E785 Hyperlipidemia, unspecified: Secondary | ICD-10-CM | POA: Diagnosis present

## 2024-04-01 DIAGNOSIS — F419 Anxiety disorder, unspecified: Secondary | ICD-10-CM | POA: Diagnosis present

## 2024-04-01 DIAGNOSIS — R1310 Dysphagia, unspecified: Secondary | ICD-10-CM | POA: Diagnosis present

## 2024-04-01 DIAGNOSIS — Z79899 Other long term (current) drug therapy: Secondary | ICD-10-CM

## 2024-04-01 DIAGNOSIS — K7460 Unspecified cirrhosis of liver: Secondary | ICD-10-CM | POA: Diagnosis present

## 2024-04-01 DIAGNOSIS — E119 Type 2 diabetes mellitus without complications: Secondary | ICD-10-CM

## 2024-04-01 DIAGNOSIS — K5900 Constipation, unspecified: Secondary | ICD-10-CM | POA: Diagnosis present

## 2024-04-01 DIAGNOSIS — Z91013 Allergy to seafood: Secondary | ICD-10-CM

## 2024-04-01 DIAGNOSIS — Z7189 Other specified counseling: Secondary | ICD-10-CM | POA: Diagnosis not present

## 2024-04-01 DIAGNOSIS — E1122 Type 2 diabetes mellitus with diabetic chronic kidney disease: Secondary | ICD-10-CM | POA: Diagnosis present

## 2024-04-01 DIAGNOSIS — Z950 Presence of cardiac pacemaker: Secondary | ICD-10-CM

## 2024-04-01 DIAGNOSIS — Z825 Family history of asthma and other chronic lower respiratory diseases: Secondary | ICD-10-CM

## 2024-04-01 DIAGNOSIS — L8990 Pressure ulcer of unspecified site, unspecified stage: Secondary | ICD-10-CM | POA: Diagnosis present

## 2024-04-01 DIAGNOSIS — I442 Atrioventricular block, complete: Secondary | ICD-10-CM | POA: Diagnosis present

## 2024-04-01 DIAGNOSIS — J9601 Acute respiratory failure with hypoxia: Secondary | ICD-10-CM

## 2024-04-01 DIAGNOSIS — Z8249 Family history of ischemic heart disease and other diseases of the circulatory system: Secondary | ICD-10-CM

## 2024-04-01 DIAGNOSIS — E43 Unspecified severe protein-calorie malnutrition: Secondary | ICD-10-CM | POA: Diagnosis present

## 2024-04-01 DIAGNOSIS — R001 Bradycardia, unspecified: Secondary | ICD-10-CM | POA: Diagnosis present

## 2024-04-01 DIAGNOSIS — K219 Gastro-esophageal reflux disease without esophagitis: Secondary | ICD-10-CM | POA: Diagnosis present

## 2024-04-01 DIAGNOSIS — E1165 Type 2 diabetes mellitus with hyperglycemia: Secondary | ICD-10-CM | POA: Diagnosis present

## 2024-04-01 LAB — ECHOCARDIOGRAM COMPLETE
AR max vel: 1.64 cm2
AV Area VTI: 1.9 cm2
AV Area mean vel: 1.83 cm2
AV Mean grad: 6 mmHg
AV Peak grad: 11.3 mmHg
Ao pk vel: 1.68 m/s
Area-P 1/2: 3.27 cm2
Height: 65 in
MV M vel: 5.47 m/s
MV Peak grad: 119.7 mmHg
S' Lateral: 3 cm
Weight: 2811.31 [oz_av]

## 2024-04-01 LAB — CBC WITH DIFFERENTIAL/PLATELET
Abs Immature Granulocytes: 0.08 K/uL — ABNORMAL HIGH (ref 0.00–0.07)
Basophils Absolute: 0 K/uL (ref 0.0–0.1)
Basophils Relative: 0 %
Eosinophils Absolute: 0 K/uL (ref 0.0–0.5)
Eosinophils Relative: 0 %
HCT: 34.3 % — ABNORMAL LOW (ref 39.0–52.0)
Hemoglobin: 11.1 g/dL — ABNORMAL LOW (ref 13.0–17.0)
Immature Granulocytes: 1 %
Lymphocytes Relative: 11 %
Lymphs Abs: 0.8 K/uL (ref 0.7–4.0)
MCH: 34.6 pg — ABNORMAL HIGH (ref 26.0–34.0)
MCHC: 32.4 g/dL (ref 30.0–36.0)
MCV: 106.9 fL — ABNORMAL HIGH (ref 80.0–100.0)
Monocytes Absolute: 0.2 K/uL (ref 0.1–1.0)
Monocytes Relative: 3 %
Neutro Abs: 5.8 K/uL (ref 1.7–7.7)
Neutrophils Relative %: 85 %
Platelets: 66 K/uL — ABNORMAL LOW (ref 150–400)
RBC: 3.21 MIL/uL — ABNORMAL LOW (ref 4.22–5.81)
RDW: 15.1 % (ref 11.5–15.5)
WBC: 6.9 K/uL (ref 4.0–10.5)
nRBC: 0 % (ref 0.0–0.2)

## 2024-04-01 LAB — COMPREHENSIVE METABOLIC PANEL WITH GFR
ALT: 15 U/L (ref 0–44)
AST: 20 U/L (ref 15–41)
Albumin: 3 g/dL — ABNORMAL LOW (ref 3.5–5.0)
Alkaline Phosphatase: 101 U/L (ref 38–126)
Anion gap: 12 (ref 5–15)
BUN: 52 mg/dL — ABNORMAL HIGH (ref 8–23)
CO2: 22 mmol/L (ref 22–32)
Calcium: 8.5 mg/dL — ABNORMAL LOW (ref 8.9–10.3)
Chloride: 121 mmol/L — ABNORMAL HIGH (ref 98–111)
Creatinine, Ser: 1.46 mg/dL — ABNORMAL HIGH (ref 0.61–1.24)
GFR, Estimated: 48 mL/min — ABNORMAL LOW
Glucose, Bld: 199 mg/dL — ABNORMAL HIGH (ref 70–99)
Potassium: 3.8 mmol/L (ref 3.5–5.1)
Sodium: 155 mmol/L — ABNORMAL HIGH (ref 135–145)
Total Bilirubin: 0.8 mg/dL (ref 0.0–1.2)
Total Protein: 6.6 g/dL (ref 6.5–8.1)

## 2024-04-01 LAB — BASIC METABOLIC PANEL WITH GFR
Anion gap: 11 (ref 5–15)
Anion gap: 11 (ref 5–15)
BUN: 49 mg/dL — ABNORMAL HIGH (ref 8–23)
BUN: 48 mg/dL — ABNORMAL HIGH (ref 8–23)
CO2: 24 mmol/L (ref 22–32)
CO2: 22 mmol/L (ref 22–32)
Calcium: 8.3 mg/dL — ABNORMAL LOW (ref 8.9–10.3)
Calcium: 8 mg/dL — ABNORMAL LOW (ref 8.9–10.3)
Chloride: 120 mmol/L — ABNORMAL HIGH (ref 98–111)
Chloride: 121 mmol/L — ABNORMAL HIGH (ref 98–111)
Creatinine, Ser: 1.35 mg/dL — ABNORMAL HIGH (ref 0.61–1.24)
Creatinine, Ser: 1.28 mg/dL — ABNORMAL HIGH (ref 0.61–1.24)
GFR, Estimated: 53 mL/min — ABNORMAL LOW
GFR, Estimated: 57 mL/min — ABNORMAL LOW
Glucose, Bld: 224 mg/dL — ABNORMAL HIGH (ref 70–99)
Glucose, Bld: 213 mg/dL — ABNORMAL HIGH (ref 70–99)
Potassium: 3.6 mmol/L (ref 3.5–5.1)
Potassium: 3.3 mmol/L — ABNORMAL LOW (ref 3.5–5.1)
Sodium: 155 mmol/L — ABNORMAL HIGH (ref 135–145)
Sodium: 154 mmol/L — ABNORMAL HIGH (ref 135–145)

## 2024-04-01 LAB — URINALYSIS, W/ REFLEX TO CULTURE (INFECTION SUSPECTED)
Bilirubin Urine: NEGATIVE
Glucose, UA: NEGATIVE mg/dL
Ketones, ur: NEGATIVE mg/dL
Leukocytes,Ua: NEGATIVE
Nitrite: NEGATIVE
Protein, ur: NEGATIVE mg/dL
Specific Gravity, Urine: 1.008 (ref 1.005–1.030)
pH: 5 (ref 5.0–8.0)

## 2024-04-01 LAB — GLUCOSE, CAPILLARY
Glucose-Capillary: 139 mg/dL — ABNORMAL HIGH (ref 70–99)
Glucose-Capillary: 179 mg/dL — ABNORMAL HIGH (ref 70–99)
Glucose-Capillary: 192 mg/dL — ABNORMAL HIGH (ref 70–99)
Glucose-Capillary: 205 mg/dL — ABNORMAL HIGH (ref 70–99)
Glucose-Capillary: 227 mg/dL — ABNORMAL HIGH (ref 70–99)

## 2024-04-01 LAB — CBG MONITORING, ED: Glucose-Capillary: 197 mg/dL — ABNORMAL HIGH (ref 70–99)

## 2024-04-01 LAB — LACTIC ACID, PLASMA
Lactic Acid, Venous: 3.9 mmol/L (ref 0.5–1.9)
Lactic Acid, Venous: 3.8 mmol/L (ref 0.5–1.9)
Lactic Acid, Venous: 2.7 mmol/L (ref 0.5–1.9)
Lactic Acid, Venous: 2.7 mmol/L (ref 0.5–1.9)

## 2024-04-01 LAB — MAGNESIUM: Magnesium: 2.1 mg/dL (ref 1.7–2.4)

## 2024-04-01 LAB — TROPONIN T, HIGH SENSITIVITY
Troponin T High Sensitivity: 23 ng/L — ABNORMAL HIGH (ref 0–19)
Troponin T High Sensitivity: 26 ng/L — ABNORMAL HIGH (ref 0–19)

## 2024-04-01 LAB — PRO BRAIN NATRIURETIC PEPTIDE: Pro Brain Natriuretic Peptide: 2535 pg/mL — ABNORMAL HIGH

## 2024-04-01 LAB — BETA-HYDROXYBUTYRIC ACID: Beta-Hydroxybutyric Acid: 0.08 mmol/L (ref 0.05–0.27)

## 2024-04-01 MED ORDER — MELATONIN 3 MG PO TABS: 6.0000 mg | ORAL_TABLET | Freq: Every evening | ORAL | Status: DC | PRN

## 2024-04-01 MED ORDER — ARIPIPRAZOLE 5 MG PO TABS
5.0000 mg | ORAL_TABLET | Freq: Every morning | ORAL | Status: DC
  Administered 2024-04-02 – 2024-04-03 (×2): 5 mg via ORAL
  Filled 2024-04-01 (×3): qty 1

## 2024-04-01 MED ORDER — DEXTROSE 5 % IV SOLN: INTRAVENOUS | Status: DC

## 2024-04-01 MED ORDER — FUROSEMIDE 10 MG/ML IJ SOLN
40.0000 mg | Freq: Once | INTRAMUSCULAR | Status: AC
  Administered 2024-04-01: 40 mg via INTRAVENOUS
  Filled 2024-04-01: qty 4

## 2024-04-01 MED ORDER — LACTATED RINGERS IV BOLUS
250.0000 mL | Freq: Once | INTRAVENOUS | Status: AC
  Administered 2024-04-01: 250 mL via INTRAVENOUS

## 2024-04-01 MED ORDER — MEMANTINE HCL 10 MG PO TABS
10.0000 mg | ORAL_TABLET | Freq: Two times a day (BID) | ORAL | Status: DC
  Administered 2024-04-02 – 2024-04-03 (×3): 10 mg via ORAL
  Filled 2024-04-01 (×5): qty 1

## 2024-04-01 MED ORDER — DIVALPROEX SODIUM 125 MG PO CSDR
250.0000 mg | DELAYED_RELEASE_CAPSULE | Freq: Two times a day (BID) | ORAL | Status: DC
  Filled 2024-04-01: qty 2

## 2024-04-01 MED ORDER — LINEZOLID 600 MG/300ML IV SOLN
600.0000 mg | Freq: Two times a day (BID) | INTRAVENOUS | Status: DC
  Administered 2024-04-01: 600 mg via INTRAVENOUS
  Filled 2024-04-01 (×4): qty 300

## 2024-04-01 MED ORDER — FAMOTIDINE 20 MG PO TABS
20.0000 mg | ORAL_TABLET | Freq: Two times a day (BID) | ORAL | Status: DC
  Administered 2024-04-02 – 2024-04-03 (×3): 20 mg via ORAL
  Filled 2024-04-01 (×5): qty 1

## 2024-04-01 MED ORDER — VALPROATE SODIUM 100 MG/ML IV SOLN
250.0000 mg | Freq: Two times a day (BID) | INTRAVENOUS | Status: DC
  Administered 2024-04-01 – 2024-04-03 (×4): 250 mg via INTRAVENOUS
  Filled 2024-04-01 (×6): qty 2.5

## 2024-04-01 MED ORDER — TAMSULOSIN HCL 0.4 MG PO CAPS
0.4000 mg | ORAL_CAPSULE | Freq: Every evening | ORAL | Status: DC
  Administered 2024-04-01: 0.4 mg via ORAL
  Filled 2024-04-01: qty 1

## 2024-04-01 MED ORDER — ACETAMINOPHEN 325 MG PO TABS
650.0000 mg | ORAL_TABLET | Freq: Four times a day (QID) | ORAL | Status: DC | PRN
  Administered 2024-04-01 – 2024-04-02 (×2): 650 mg via ORAL
  Filled 2024-04-01 (×2): qty 2

## 2024-04-01 MED ORDER — FREE WATER: 125.0000 mL | Freq: Three times a day (TID) | Status: DC

## 2024-04-01 MED ORDER — INSULIN ASPART 100 UNIT/ML IJ SOLN
0.0000 [IU] | INTRAMUSCULAR | Status: DC
  Administered 2024-04-01: 2 [IU] via SUBCUTANEOUS
  Administered 2024-04-01: 3 [IU] via SUBCUTANEOUS
  Filled 2024-04-01 (×2): qty 1

## 2024-04-01 MED ORDER — ENOXAPARIN SODIUM 40 MG/0.4ML IJ SOSY
40.0000 mg | PREFILLED_SYRINGE | INTRAMUSCULAR | Status: DC
  Administered 2024-04-01 – 2024-04-03 (×3): 40 mg via SUBCUTANEOUS
  Filled 2024-04-01 (×3): qty 0.4

## 2024-04-01 MED ORDER — INSULIN GLARGINE 100 UNIT/ML ~~LOC~~ SOLN
15.0000 [IU] | Freq: Every day | SUBCUTANEOUS | Status: DC
  Administered 2024-04-01 – 2024-04-03 (×3): 15 [IU] via SUBCUTANEOUS
  Filled 2024-04-01 (×4): qty 0.15

## 2024-04-01 MED ORDER — SIMVASTATIN 20 MG PO TABS
40.0000 mg | ORAL_TABLET | Freq: Every evening | ORAL | Status: DC
  Administered 2024-04-01 – 2024-04-02 (×2): 40 mg via ORAL
  Filled 2024-04-01 (×2): qty 2

## 2024-04-01 MED ORDER — SODIUM CHLORIDE 0.9 % IV SOLN
2.0000 g | Freq: Once | INTRAVENOUS | Status: AC
  Administered 2024-04-01: 2 g via INTRAVENOUS
  Filled 2024-04-01: qty 12.5

## 2024-04-01 MED ORDER — PROCHLORPERAZINE EDISYLATE 10 MG/2ML IJ SOLN: 5.0000 mg | Freq: Four times a day (QID) | INTRAMUSCULAR | Status: DC | PRN

## 2024-04-01 MED ORDER — AZITHROMYCIN 250 MG PO TABS: 500.0000 mg | ORAL_TABLET | Freq: Once | ORAL | Status: DC

## 2024-04-01 MED ORDER — POLYETHYLENE GLYCOL 3350 17 G PO PACK: 17.0000 g | PACK | Freq: Every day | ORAL | Status: DC | PRN

## 2024-04-01 MED ORDER — PANTOPRAZOLE SODIUM 40 MG IV SOLR
40.0000 mg | Freq: Every day | INTRAVENOUS | Status: DC
  Administered 2024-04-01 – 2024-04-03 (×3): 40 mg via INTRAVENOUS
  Filled 2024-04-01 (×3): qty 10

## 2024-04-01 MED ORDER — SODIUM CHLORIDE 0.9 % IV SOLN
2.0000 g | Freq: Two times a day (BID) | INTRAVENOUS | Status: DC
  Administered 2024-04-01: 2 g via INTRAVENOUS
  Filled 2024-04-01: qty 12.5

## 2024-04-01 MED ORDER — INSULIN ASPART 100 UNIT/ML IJ SOLN
0.0000 [IU] | INTRAMUSCULAR | Status: DC
  Administered 2024-04-01: 3 [IU] via SUBCUTANEOUS
  Administered 2024-04-01: 5 [IU] via SUBCUTANEOUS
  Administered 2024-04-02: 2 [IU] via SUBCUTANEOUS
  Administered 2024-04-02 (×2): 3 [IU] via SUBCUTANEOUS
  Administered 2024-04-02 – 2024-04-03 (×2): 2 [IU] via SUBCUTANEOUS
  Filled 2024-04-01 (×7): qty 1

## 2024-04-01 NOTE — ED Notes (Signed)
 ED TO INPATIENT HANDOFF REPORT  ED Nurse Name and Phone #: [phone]  S Name/Age/Gender Jay Campbell 79 y.o. male Room/Bed: APA18/APA18  Code Status   Code Status: Limited: Do not attempt resuscitation (DNR) -DNR-LIMITED -Do Not Intubate/DNI   Home/SNF/Other Nursing Home Patient oriented to: self Is this baseline? Yes   Triage Complete: Triage complete  Chief Complaint Multifocal pneumonia [J18.8]  Triage Note Pt bib [facility] from [facility], Per EMS staff reported that pt was more lethargic than normal and SOB. Pt was recently admitted with sepsis, AKI and respiratory failure, was d/c'd yesterday    5mg  albuterol  given PTA   Allergies Allergies[1]  Level of Care/Admitting Diagnosis ED Disposition     ED Disposition  Admit   Condition  --   Comment  Hospital Area: [facility] [[id number]]  Level of Care: Telemetry [5]  Diagnosis: Multifocal pneumonia [8421132]  Admitting Physician: SHONA TERRY LOISE [[id number]]  Attending Physician: SHONA TERRY LOISE [[id number]]  Certification:: I certify this patient will need inpatient services for at least 2 midnights  Expected Medical Readiness: 04/03/2024          B Medical/Surgery History 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   Warfarin anticoagulation    Past Surgical History:  Procedure Laterality Date   CATARACT EXTRACTION Right    COCHLEAR IMPLANT Right    HEMORRHOID BANDING     KNEE ARTHROSCOPY Right 1992   Dr Brenna    PACEMAKER GENERATOR CHANGE Bilateral 02/01/2013    Procedure: PACEMAKER GENERATOR CHANGE;  Surgeon: Lynwood JONETTA Kelsie, MD;  Location: [facility] CATH [facility];  Service: Cardiovascular;  Laterality: Bilateral;   PACEMAKER PLACEMENT  2002; 2014   SJM implanted by Dr Waddell with AV nodal ablation performed; gen change to [employer] SR RF by Dr Kelsie 02-01-13   RETINAL LASER PROCEDURE Left    SLT LASER APPLICATION Left 12/14/2015   Procedure: SLT LASER APPLICATION;  Surgeon: Dow JULIANNA Burke, MD;  Location: [facility];  Service: Ophthalmology;  Laterality: Left;     A IV Location/Drains/Wounds Patient Lines/Drains/Airways Status     Active Line/Drains/Airways     Name Placement date Placement time Site Days   Peripheral IV 03/30/24 24 G Anterior;Left Forearm 03/30/24  1033  Forearm  2   Peripheral IV 04/01/24 20 G Posterior;Right Forearm 04/01/24  0048  Forearm  less than 1   External Urinary Catheter 03/24/24  1100  --  8   Wound 03/24/24 1945 Pressure Injury Heel Left Deep Tissue Pressure Injury - Purple or maroon localized area of discolored intact skin or blood-filled blister due to damage of underlying soft tissue from pressure and/or shear. 03/24/24  1945  Heel  8            Intake/Output Last 24 hours No intake or output data in the 24 hours ending 04/01/24 9766  Labs/Imaging Results for orders placed or performed during the hospital encounter of 04/01/24 (from the past 48 hours)  POC CBG, ED     Status: Abnormal   Collection Time: 04/01/24 12:49 AM  Result Value Ref Range   Glucose-Capillary 197 (H) 70 - 99 mg/dL    Comment: Glucose reference range applies only to samples taken after fasting for at least 8 hours.  Lactic acid, plasma     Status: Abnormal   Collection Time: 04/01/24 12:50 AM  Result Value Ref Range   Lactic Acid, Venous 3.9 (HH) 0.5 - 1.9 mmol/L    Comment: Critical Value, Read Back and verified with MOSTELLAR,M ON 04/01/24 AT 0128 BY PURDIE,J Performed at [facility], [address] [place], [place] [address]   Comprehensive  metabolic panel     Status: Abnormal   Collection Time: 04/01/24 12:50 AM  Result Value Ref Range   Sodium 155 (H) 135 - 145 mmol/L   Potassium 3.8 3.5 - 5.1 mmol/L   Chloride 121 (H) 98 - 111 mmol/L   CO2 22 22 - 32 mmol/L   Glucose, Bld 199 (H) 70 - 99 mg/dL    Comment: Glucose reference range applies only to samples taken after fasting for at least 8 hours.   BUN 52 (H) 8 - 23 mg/dL   Creatinine, Ser 8.53 (H) 0.61 - 1.24 mg/dL   Calcium 8.5 (L) 8.9 - 10.3 mg/dL   Total Protein 6.6 6.5 - 8.1 g/dL   Albumin 3.0 (L) 3.5 - 5.0 g/dL   AST 20 15 - 41 U/L   ALT 15 0 - 44 U/L   Alkaline Phosphatase 101 38 - 126 U/L   Total Bilirubin 0.8 0.0 - 1.2 mg/dL   GFR, Estimated 48 (L) >60 mL/min    Comment: (NOTE) Calculated using the CKD-EPI Creatinine Equation (2021)    Anion gap 12 5 - 15    Comment: Performed at [facility], [address] [place], [place] [address]  CBC with Differential     Status: Abnormal   Collection Time: 04/01/24 12:50 AM  Result Value Ref Range   WBC 6.9 4.0 - 10.5 K/uL   RBC 3.21 (L) 4.22 - 5.81 MIL/uL   Hemoglobin 11.1 (L) 13.0 - 17.0 g/dL   HCT 65.6 (L) 60.9 - 47.9 %   MCV 106.9 (H) 80.0 - 100.0 fL   MCH 34.6 (H) 26.0 - 34.0 pg   MCHC 32.4 30.0 - 36.0 g/dL   RDW 84.8 88.4 - 84.4 %   Platelets 66 (L) 150 - 400 K/uL    Comment: SPECIMEN CHECKED FOR CLOTS REPEATED TO VERIFY    nRBC 0.0 0.0 - 0.2 %   Neutrophils Relative % 85 %   Neutro Abs 5.8 1.7 - 7.7 K/uL   Lymphocytes Relative 11 %   Lymphs Abs 0.8 0.7 - 4.0 K/uL   Monocytes Relative 3 %   Monocytes Absolute 0.2 0.1 - 1.0 K/uL   Eosinophils Relative 0 %   Eosinophils Absolute 0.0 0.0 - 0.5 K/uL   Basophils Relative 0 %   Basophils Absolute 0.0 0.0 - 0.1 K/uL   Immature Granulocytes 1 %   Abs Immature Granulocytes 0.08 (H) 0.00 - 0.07 K/uL    Comment: Performed at [facility], [address], [place], [place] [address]  Pro Brain natriuretic peptide     Status: Abnormal   Collection Time:  04/01/24 12:50 AM  Result Value Ref Range   Pro Brain Natriuretic Peptide 2,535.0 (H) <300.0 pg/mL    Comment: (NOTE) Age Group        Cut-Points  Interpretation  < 50 years     450 pg/mL       NT-proBNP > 450 pg/mL indicates                                ADHF is likely              50 to 75 years  900 pg/mL      NT-proBNP > 900 pg/mL indicates          ADHF is likely  > 75 years      1800 pg/mL     NT-proBNP > 1800 pg/mL indicates          ADHF is likely                           All ages    Results between       Indeterminate. Further clinical             300 and the cut-   information is needed to determine            point for age group   if ADHF is present.                                                             Elecsys proBNP II/ Elecsys proBNP II STAT           Cut-Point                       Interpretation  300 pg/mL                    NT-proBNP <300pg/mL indicates                             ADHF is not likely  Performed at [facility], [address], [place], [place] [address]   Troponin T, High Sensitivity     Status: Abnormal   Collection Time: 04/01/24 12:50 AM  Result Value Ref Range   Troponin T High Sensitivity 23 (H) 0 - 19 ng/L    Comment: (NOTE) Biotin concentrations > 1000 ng/mL falsely decrease TnT results.  Serial cardiac troponin measurements are suggested.  Refer to the Links section for chest pain algorithms and additional  guidance. Performed at [facility], [address], [place], [place] [address]   Magnesium     Status: None   Collection Time: 04/01/24 12:50 AM  Result Value Ref Range   Magnesium 2.1 1.7 - 2.4 mg/dL    Comment: Performed at [facility], [address], [place], [place] [address]  Blood Culture (routine x 2)     Status: None (Preliminary result)   Collection Time: 04/01/24  1:39 AM   Specimen: Right Antecubital; Blood  Result Value Ref Range   Specimen Description RIGHT ANTECUBITAL    Special Requests       BOTTLES DRAWN AEROBIC AND ANAEROBIC Blood Culture adequate volume Performed at [facility], [address] [place], [place] [address]    Culture PENDING    Report Status PENDING    CT CHEST ABDOMEN PELVIS  WO CONTRAST Result Date: 04/01/2024 EXAM: CT CHEST, ABDOMEN AND PELVIS WITHOUT CONTRAST 04/01/2024 01:25:10 AM TECHNIQUE: CT of the chest, abdomen and pelvis was performed without the administration of intravenous contrast. Multiplanar reformatted images are provided for review. Automated exposure control, iterative reconstruction, and/or weight based adjustment of the mA/kV was utilized to reduce the radiation dose to as low as reasonably achievable. COMPARISON: 02/07/2023. CLINICAL HISTORY: Sepsis. FINDINGS: CHEST: MEDIASTINUM AND LYMPH NODES: Cardiomegaly. Pacer wires in the right heart. Coronary artery aortic atherosclerosis. The central airways are clear. No mediastinal, hilar or axillary lymphadenopathy. LUNGS AND PLEURA: Mild to moderate bilateral pleural effusions, right greater than left. Consolidation with air bronchograms in the left lower lobe and posterior lingula concerning for pneumonia. Compressive atelectasis or pneumonia in the right lower lobe. Patchy ground glass airspace opacities also likely related to pneumonia. No pneumothorax. ABDOMEN AND PELVIS: LIVER: Mild nodular contours of the liver suggest cirrhosis. GALLBLADDER AND BILE DUCTS: High-density material within the gallbladder could reflect small stones or contrast material. No biliary ductal dilatation. SPLEEN: The spleen is mildly enlarged at 13 cm craniocaudal length. PANCREAS: No acute abnormality. ADRENAL GLANDS: No acute abnormality. KIDNEYS, URETERS AND BLADDER: 1.6 cm stone in the upper pole of the right kidney. No hydronephrosis. No perinephric or periureteral stranding. Urinary bladder is unremarkable. GI AND BOWEL: The stomach is decompressed but appears to be thick-walled. This could be related to portal gastropathy or  gastritis. Sigmoid diverticulosis. No active diverticulitis. Moderate stool burden throughout the colon. There is no bowel obstruction. REPRODUCTIVE ORGANS: Prostate enlargement. PERITONEUM AND RETROPERITONEUM: Moderate free fluid in the abdomen and pelvis. No free air. VASCULATURE: Aorta is normal in caliber. Aortoiliac catheter sclerosis. ABDOMINAL AND PELVIS LYMPH NODES: No lymphadenopathy. BONES AND SOFT TISSUES: Stable severe compression fracture at L1. Diffuse osteopenia. No focal soft tissue abnormality. IMPRESSION: 1. Left lower lobe and lingular consolidation with patchy bilateral ground-glass opacities, most consistent with multifocal pneumonia. 2. Compressive atelectasis versus additional pneumonia in the right lower lobe. 3. Mild to moderate bilateral pleural effusions, right greater than left. 4. Cirrhotic liver morphology with moderate abdominopelvic ascites and splenomegaly . 5. Gastric wall thickening may be related to portal gastropathy or gastritis . 6. Right upper pole nephrolithiasis . 7. Coronary artery disease, aortic atherosclerosis. 8. Sigmoid diverticulosis. Moderate stool burden in the colon. Electronically signed by: Franky Crease MD 04/01/2024 01:36 AM EST RP Workstation: [id number]   DG Chest Port 1 View Result Date: 04/01/2024 EXAM: 1 VIEW(S) XRAY OF THE CHEST 04/01/2024 01:00:00 AM COMPARISON: Comparison from 03/24/2024. CLINICAL HISTORY: Increased lethargy and shortness of breath. FINDINGS: LUNGS AND PLEURA: Increasing left basilar infiltrate. Small left pleural effusion. No pneumothorax. HEART AND MEDIASTINUM: The cardiac shadow is prominent. The pacemaker is again seen and stable. BONES AND SOFT TISSUES: No bony abnormality is noted. IMPRESSION: 1. Increasing left basilar infiltrate and small effusion. 2. Prominent cardiac shadow with stable pacemaker. Electronically signed by: Oneil Devonshire MD 04/01/2024 01:04 AM EST RP Workstation: [id number]    Pending Labs Unresulted Labs  (From admission, onward)     Start     Ordered   04/08/24 0500  Creatinine, serum  (enoxaparin  (LOVENOX )    CrCl >/= 30 ml/min)  Weekly,   R     Comments: while on enoxaparin  therapy    04/01/24 0219   04/01/24 0038  Lactic acid, plasma  (Septic presentation on arrival (screening labs, nursing and treatment orders for obvious sepsis))  STAT Now then every 2 hours,   R (with  STAT occurrences)      04/01/24 0038   04/01/24 0038  Blood Culture (routine x 2)  (Septic presentation on arrival (screening labs, nursing and treatment orders for obvious sepsis))  BLOOD CULTURE X 2,   STAT      04/01/24 0038   04/01/24 0038  Urinalysis, w/ Reflex to Culture (Infection Suspected) -Urine, Clean Catch  (Septic presentation on arrival (screening labs, nursing and treatment orders for obvious sepsis))  ONCE - URGENT,   URGENT       Question Answer Comment  Specimen Source Urine, Clean Catch   Obtain urine by in and out catheter if not obtained within 30 minutes of placing in a treatment room? Yes      04/01/24 0038   04/01/24 0038  Beta-hydroxybutyric acid  Once,   URGENT        04/01/24 0038            Vitals/Pain Today's Vitals   04/01/24 0054 04/01/24 0055 04/01/24 0100 04/01/24 0215  BP:  (!) 156/90 (!) 155/73 (!) 153/69  Pulse: 60 62 65 63  Resp: 17 17 18 16   Temp:      SpO2: 95% 93% 96% 95%    Isolation Precautions No active isolations  Medications Medications  azithromycin  (ZITHROMAX ) tablet 500 mg (500 mg Oral Not Given 04/01/24 0145)  enoxaparin  (LOVENOX ) injection 40 mg (has no administration in time range)  acetaminophen  (TYLENOL ) tablet 650 mg (has no administration in time range)  prochlorperazine  (COMPAZINE ) injection 5 mg (has no administration in time range)  polyethylene glycol (MIRALAX  / GLYCOLAX ) packet 17 g (has no administration in time range)  melatonin tablet 6 mg (has no administration in time range)  divalproex  (DEPAKOTE  SPRINKLE) capsule 250 mg (has no  administration in time range)  free water  125 mL (has no administration in time range)  ceFEPIme  (MAXIPIME ) 2 g in sodium chloride  0.9 % 100 mL IVPB (0 g Intravenous Stopped 04/01/24 0213)  furosemide  (LASIX ) injection 40 mg (40 mg Intravenous Given 04/01/24 0142)    Mobility          , Pulmonary Assessment Handoff:  Lung sounds: Bilateral Breath Sounds: Diminished L Breath Sounds: Diminished R Breath Sounds: Diminished O2 Device: Nasal Cannula O2 Flow Rate (L/min): 2 L/min    R Recommendations: See Admitting Provider Note  Report given to:   Additional Notes:  Pt from memory care unit, GCS is baseline per EMS      [1]  Allergies Allergen Reactions   Contrast Media [Iodinated Contrast Media] Other (See Comments)    No reaction listed on MAR   Vioxx [Rofecoxib]     On pt MAR   Shellfish Allergy Itching and Rash   Sulfonamide Derivatives Itching and Rash

## 2024-04-01 NOTE — Plan of Care (Signed)
" °  Problem: Health Behavior/Discharge Planning: Goal: Ability to manage health-related needs will improve Outcome: Not Met (add Reason)   Problem: Activity: Goal: Risk for activity intolerance will decrease Outcome: Not Met (add Reason)   Problem: Nutrition: Goal: Adequate nutrition will be maintained Outcome: Not Met (add Reason)   Problem: Skin Integrity: Goal: Risk for impaired skin integrity will decrease Outcome: Not Met (add Reason)   Problem: Coping: Goal: Ability to adjust to condition or change in health will improve Outcome: Not Met (add Reason)   "

## 2024-04-01 NOTE — H&P (Addendum)
 " History and Physical  Jay Campbell FMW:[id number] DOB: 08-29-1945 DOA: 04/01/2024  Referring physician: Dr. Melvenia, EDP  PCP: [name], MD  Outpatient Specialists: Cardiology, medical oncology. Patient coming from: SNF.  Chief Complaint: Shortness of breath and generalized weakness.  HPI: Jay Campbell is a 79 y.o. male with medical history significant for Alzheimer's dementia, history of agitation due to dementia, coronary artery disease, permanent atrial fibrillation previously on Eliquis , complete heart block status post pacemaker placement, chronic thrombocytopenia, type 2 diabetes, hypertension, hyperlipidemia, GERD, BPH, who presents to the ER via EMS from SNF due to shortness of breath and generalized weakness.  The patient was discharged yesterday after being admitted for sepsis secondary to pneumonia and acute hypoxic respiratory failure requiring 2 L nasal cannula.  Staff at SNF reported the patient was more short of breath and lethargic than baseline today.  EMS was activated.  He received albuterol  nebs en route via EMS.  In the ER, hypoxic with O2 saturation of 87% on 2 L nasal cannula.  The patient is alert and confused in the setting of dementia.  Frail-appearing with pressure wounds in heels.  He is minimally verbal and unable to provide a history.    A chest x-ray revealed increasing left basilar infiltrates and small effusion.  Prominent cardiac shadow with stable pacemaker.  Lactic acid 3.9.  There was concern for sepsis.  Subsequently, his CT chest abdomen pelvis without contrast revealed left lower lobe and lingular consolidation with patchy bilateral ground glass opacities more consistent with multifocal pneumonia.  Compressive atelectasis versus additional pneumonia in the right lower lobe.  Mild to moderate bilateral pleural effusions, right greater than left.  Cirrhotic liver morphology with moderate abdominal pelvic ascites and splenomegaly.  Gastric wall thickening  may be related to portal gastropathy or gastritis.  Right upper lobe nephrolithiasis.  Coronary artery disease, aortic atherosclerosis.  Sigmoid diverticulosis.  Moderate stool burden in the colon.  The patient received 1 dose of cefepime  and azithromycin  in the ER.  TRH, hospitalist service, was asked to admit for worsening pneumonia.  ED Course: Temperature 98.4.  BP 155/93, pulse 59, respiration rate 18, O2 saturation 95% on 4 L.  Review of Systems: Review of systems as noted in the HPI. All other systems reviewed and are negative.   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   Warfarin anticoagulation    Past Surgical History:  Procedure Laterality Date   CATARACT EXTRACTION Right    COCHLEAR IMPLANT Right    HEMORRHOID BANDING     KNEE ARTHROSCOPY Right 1992   Dr Brenna    PACEMAKER GENERATOR CHANGE Bilateral 02/01/2013   Procedure: PACEMAKER GENERATOR CHANGE;  Surgeon: Lynwood JONETTA Kelsie, MD;  Location: [facility] CATH LAB;  Service: Cardiovascular;  Laterality: Bilateral;   PACEMAKER PLACEMENT  2002; 2014   SJM implanted by Dr Waddell with AV nodal ablation performed; gen change to [employer] SR RF by Dr Kelsie 02-01-13   RETINAL  LASER PROCEDURE Left    SLT LASER APPLICATION Left 12/14/2015   Procedure: SLT LASER APPLICATION;  Surgeon: Dow JULIANNA Burke, MD;  Location: [facility] ORS;  Service: Ophthalmology;  Laterality: Left;    Social History:  reports that he has never smoked. He has never used smokeless tobacco. He reports that he does not drink alcohol  and does not use  drugs.   Allergies[1]  Family History  Problem Relation Age of Onset   COPD Mother    Emphysema Mother    Cancer - Lung Father    Heart disease Paternal Uncle    Stomach cancer Neg Hx    Colon cancer Neg Hx       Prior to Admission medications  Medication Sig Start Date End Date Taking? Authorizing Provider  acetaminophen  (TYLENOL ) 650 MG CR tablet Take 650 mg by mouth every 8 (eight) hours as needed for pain.    [provider]  allopurinol  (ZYLOPRIM ) 100 MG tablet Take 100 mg by mouth in the morning. 12/15/11   [provider]  Amino Acids-Protein Hydrolys (FEEDING SUPPLEMENT, PRO-STAT 64,) LIQD Take 30 mLs by mouth in the morning.    [provider]  ARIPiprazole  (ABILIFY ) 5 MG tablet Take 5 mg by mouth every morning. 11/05/23   [provider]  ascorbic acid (VITAMIN C) 500 MG tablet Take 500 mg by mouth 2 (two) times daily.    [provider]  BD INSULIN  SYRINGE U/F 31G X 5/16 0.3 ML MISC  06/26/19   [provider]  Calcium-Vitamins C & D 500-10-250 MG-MG-UNIT CHEW Chew 1 tablet by mouth 2 (two) times daily.    [provider]  cetirizine  (ZYRTEC ) 10 MG tablet Take 10 mg by mouth every morning.    [provider]  Continuous Blood Gluc Sensor (FREESTYLE LIBRE 14 DAY SENSOR) MISC  06/02/21   [provider]  divalproex  (DEPAKOTE  SPRINKLE) 125 MG capsule Take 125 mg by mouth daily in the afternoon. 1400    [provider]  divalproex  (DEPAKOTE  SPRINKLE) 125 MG capsule Take 250 mg by mouth 2 (two) times daily. 0800 and 2000    [provider]  donepezil  (ARICEPT ) 10 MG tablet Take 1 tablet (10 mg total) by mouth at bedtime. 01/13/22   Whitfield Raisin, NP  famotidine  (PEPCID ) 20 MG tablet Take 20 mg by mouth 2 (two) times daily. 07/05/19   [provider]  ferrous sulfate  325 (65 FE) MG EC tablet Take 325 mg by mouth 3 (three) times daily.    [provider]  guaifenesin   (HUMIBID E) 400 MG TABS tablet Take 400 mg by mouth every 12 (twelve) hours.    [provider]  HUMULIN 70/30 (70-30) 100 UNIT/ML injection Inject 30 Units into the skin in the morning. 05/25/21   [provider]  latanoprost  (XALATAN ) 0.005 % ophthalmic solution Place 1 drop into both eyes at bedtime. 02/23/21   [provider]  LORazepam  (ATIVAN ) 0.5 MG tablet Take 1 tablet (0.5 mg total) by mouth 2 (two) times daily. 03/31/24   Ricky Fines, MD  memantine  (NAMENDA ) 10 MG tablet TAKE 1 TABLET BY MOUTH TWICE  DAILY 11/02/21   Whitfield Raisin, NP  metoprolol  tartrate (LOPRESSOR ) 25 MG tablet Take 25 mg by mouth 2 (two) times daily.    [provider]  Multiple Vitamin (MULTIVITAMIN) tablet Take 1 tablet by mouth in the morning.    [provider]  Nutritional Supplements (BOOST GLUCOSE CONTROL PO) Take 237 mLs  by mouth 2 (two) times daily between meals.    [provider]  [facility] VERIO test strip  08/17/17   [provider]  pantoprazole  (PROTONIX ) 40 MG tablet Take 1 tablet (40 mg total) by mouth daily. 04/01/24   Ricky Fines, MD  predniSONE  (DELTASONE ) 20 MG tablet Take 2 tablets by mouth daily x 1 day; then 1 tablet by mouth daily x 2 days; then half tablet by mouth daily x 3 days and stop prednisone . 04/01/24   Ricky Fines, MD  simvastatin  (ZOCOR ) 40 MG tablet Take 40 mg by mouth every evening.    [provider]  tamsulosin  (FLOMAX ) 0.4 MG CAPS capsule Take 0.4 mg by mouth every evening. 09/03/20   [provider]  Water  For Irrigation, Sterile (FREE WATER ) SOLN Take 125 mLs by mouth every 8 (eight) hours. 03/31/24   Ricky Fines, MD  Zinc 220 (50 Zn) MG CAPS Take 1 capsule by mouth every morning.    [provider]    Physical Exam: BP (!) 155/93 (BP Location: Left Arm)   Pulse (!) 59   Temp 98.4 F (36.9 C) (Oral)   Resp 18   Ht 5' 5 (1.651 m)   Wt 79.7 kg   SpO2 95%   BMI 29.24 kg/m    General: 79 y.o. year-old male frail-appearing in no acute distress.  Alert and confused, minimally verbal. Cardiovascular: Irregular rate and rhythm with no rubs or gallops.  No thyromegaly or JVD noted.  No lower extremity edema bilaterally. Respiratory: Mild rales at bases with poor inspiratory effort. Abdomen: Soft nontender nondistended with normal bowel sounds x4 quadrants. Muskuloskeletal: No cyanosis or clubbing noted bilaterally Neuro: CN II-XII intact, strength, sensation, reflexes Skin: Pressure wounds, present on admission. Psychiatry: Judgement and insight appear altered. Mood is appropriate for condition and setting          Labs on Admission:  Basic Metabolic Panel: Recent Labs  Lab 03/25/24 1630 03/26/24 0324 03/27/24 0237 03/27/24 0658 03/29/24 0508 03/30/24 0357 03/31/24 0431 04/01/24 0050  NA 157*   < > 157* 157* 156* 155* 156* 155*  K 4.1   < > 4.8  --  4.0 4.3 3.5 3.8  CL 129*   < > 125*  --  123* 123* 125* 121*  CO2 15*   < > 21*  --  23 22 20* 22  GLUCOSE 289*   < > 260*  --  214* 248* 207* 199*  BUN 69*   < > 76*  --  64* 57* 54* 52*  CREATININE 1.77*   < > 1.66*  --  1.35* 1.35* 1.30* 1.46*  CALCIUM 8.1*   < > 8.1*  --  8.1* 8.4* 8.0* 8.5*  MG  --   --   --   --   --   --   --  2.1  PHOS 2.3*  --  3.7  --   --   --   --   --    < > = values in this interval not displayed.   Liver Function Tests: Recent Labs  Lab 03/25/24 1630 03/27/24 0237 04/01/24 0050  AST  --   --  20  ALT  --   --  15  ALKPHOS  --   --  101  BILITOT  --   --  0.8  PROT  --   --  6.6  ALBUMIN 2.7* 2.3* 3.0*   No results for input(s): LIPASE, AMYLASE in the  last 168 hours. No results for input(s): AMMONIA in the last 168 hours. CBC: Recent Labs  Lab 03/26/24 0324 04/01/24 0050  WBC 2.1* 6.9  NEUTROABS  --  5.8  HGB 8.1* 11.1*  HCT 23.8* 34.3*  MCV 103.0* 106.9*  PLT 34* 66*   Cardiac Enzymes: No results for input(s): CKTOTAL, CKMB, CKMBINDEX,  TROPONINI in the last 168 hours.  BNP (last 3 results) No results for input(s): BNP in the last 8760 hours.  ProBNP (last 3 results) Recent Labs    04/01/24 0050  PROBNP 2,535.0*    CBG: Recent Labs  Lab 03/30/24 2320 03/31/24 0336 03/31/24 0741 03/31/24 1145 04/01/24 0049  GLUCAP 362* 208* 134* 230* 197*    Radiological Exams on Admission: CT CHEST ABDOMEN PELVIS WO CONTRAST Result Date: 04/01/2024 EXAM: CT CHEST, ABDOMEN AND PELVIS WITHOUT CONTRAST 04/01/2024 01:25:10 AM TECHNIQUE: CT of the chest, abdomen and pelvis was performed without the administration of intravenous contrast. Multiplanar reformatted images are provided for review. Automated exposure control, iterative reconstruction, and/or weight based adjustment of the mA/kV was utilized to reduce the radiation dose to as low as reasonably achievable. COMPARISON: 02/07/2023. CLINICAL HISTORY: Sepsis. FINDINGS: CHEST: MEDIASTINUM AND LYMPH NODES: Cardiomegaly. Pacer wires in the right heart. Coronary artery aortic atherosclerosis. The central airways are clear. No mediastinal, hilar or axillary lymphadenopathy. LUNGS AND PLEURA: Mild to moderate bilateral pleural effusions, right greater than left. Consolidation with air bronchograms in the left lower lobe and posterior lingula concerning for pneumonia. Compressive atelectasis or pneumonia in the right lower lobe. Patchy ground glass airspace opacities also likely related to pneumonia. No pneumothorax. ABDOMEN AND PELVIS: LIVER: Mild nodular contours of the liver suggest cirrhosis. GALLBLADDER AND BILE DUCTS: High-density material within the gallbladder could reflect small stones or contrast material. No biliary ductal dilatation. SPLEEN: The spleen is mildly enlarged at 13 cm craniocaudal length. PANCREAS: No acute abnormality. ADRENAL GLANDS: No acute abnormality. KIDNEYS, URETERS AND BLADDER: 1.6 cm stone in the upper pole of the right kidney. No hydronephrosis. No  perinephric or periureteral stranding. Urinary bladder is unremarkable. GI AND BOWEL: The stomach is decompressed but appears to be thick-walled. This could be related to portal gastropathy or gastritis. Sigmoid diverticulosis. No active diverticulitis. Moderate stool burden throughout the colon. There is no bowel obstruction. REPRODUCTIVE ORGANS: Prostate enlargement. PERITONEUM AND RETROPERITONEUM: Moderate free fluid in the abdomen and pelvis. No free air. VASCULATURE: Aorta is normal in caliber. Aortoiliac catheter sclerosis. ABDOMINAL AND PELVIS LYMPH NODES: No lymphadenopathy. BONES AND SOFT TISSUES: Stable severe compression fracture at L1. Diffuse osteopenia. No focal soft tissue abnormality. IMPRESSION: 1. Left lower lobe and lingular consolidation with patchy bilateral ground-glass opacities, most consistent with multifocal pneumonia. 2. Compressive atelectasis versus additional pneumonia in the right lower lobe. 3. Mild to moderate bilateral pleural effusions, right greater than left. 4. Cirrhotic liver morphology with moderate abdominopelvic ascites and splenomegaly . 5. Gastric wall thickening may be related to portal gastropathy or gastritis . 6. Right upper pole nephrolithiasis . 7. Coronary artery disease, aortic atherosclerosis. 8. Sigmoid diverticulosis. Moderate stool burden in the colon. Electronically signed by: Franky Crease MD 04/01/2024 01:36 AM EST RP Workstation: [id number]   DG Chest Port 1 View Result Date: 04/01/2024 EXAM: 1 VIEW(S) XRAY OF THE CHEST 04/01/2024 01:00:00 AM COMPARISON: Comparison from 03/24/2024. CLINICAL HISTORY: Increased lethargy and shortness of breath. FINDINGS: LUNGS AND PLEURA: Increasing left basilar infiltrate. Small left pleural effusion. No pneumothorax. HEART AND MEDIASTINUM: The cardiac shadow is prominent. The pacemaker is  again seen and stable. BONES AND SOFT TISSUES: No bony abnormality is noted. IMPRESSION: 1. Increasing left basilar infiltrate and small  effusion. 2. Prominent cardiac shadow with stable pacemaker. Electronically signed by: Oneil Devonshire MD 04/01/2024 01:04 AM EST RP Workstation: [id number]    EKG: I independently viewed the EKG done and my findings are as followed: Ventricular paced rate of 60.  QTc 481.  Assessment/Plan Present on Admission:  Multifocal pneumonia  Principal Problem:   Multifocal pneumonia  Multifocal pneumonia, seen on CT scan, POA Discharged yesterday after admission for sepsis secondary to pneumonia and acute hypoxic respiratory failure. MRSA positive on 03/24/2024 Started IV linezolid  Continue cefepime  initiated in the ER. Follow peripheral blood cultures x 2. Will need to rule out aspiration N.p.o. until passes swallow evaluation Aspiration precaution in place.  Bilateral mild to moderate pleural effusions, seen on CT scan, POA Closely monitor volume status Early mobilization Incentive spirometer with close instructions.  Acute hypoxic respiratory failure secondary to the above Recently started on 2 L O2 supplementation during previous admission for pneumonia. Currently requiring 4 L to maintain O2 saturation above 92% Continue to maintain O2 saturation above 92% Wean off oxygen  supplementation as tolerated.  Lactic acidosis, suspect multifactorial Hypoxia, hypoperfusion, infection Lactic acid 3.9, repeat 3.8. Continue to trend lactic acid Follow peripheral blood cultures x 2. IV fluid hydration LR bolus 250 cc/h x 1 Continue IV antibiotics Closely monitor respiratory status while on gentle IV fluid.  Elevated troponin, suspect demand ischemia secondary to the above History of coronary artery disease Aortic atherosclerosis High-sensitivity troponin 23, 26. No evidence of acute ischemia on twelve-lead EKG No chest pain reported. Monitor on telemetry.  Acute on chronic HFpEF Last 2D echo done on 05/09/2019 revealed LVEF 60 to 65% Presented with dyspnea, cardiomegaly, and bilateral  pleural effusions Follow-up repeat 2D echo. Monitor strict I's and O's and daily weight Consider cardiology consultation in the morning.  Chronic hypernatremia due to poor oral intake Speech therapy is consulted to rule out dysphagia Presented with serum sodium of 155 Will start D5W at 40 cc/h x 8 hours. Follow repeat BMP every 4 hours x 2 Avoid quick correction of hypernatremia. Encourage oral intake once passes a swallow evaluation.  Gastric wall thickening, may be related to portal gastropathy or gastritis, seen on CT scan. IV PPI Protonix  40 mg daily No reported abdominal pain or GI bleed Avoid NSAIDs Can switch IV PPI to p.o. once passes a swallow evaluation.  Generalized weakness Unclear baseline PT OT evaluation Fall precautions.  Type 2 diabetes with hyperglycemia Last hemoglobin A1c 7.3 on 03/24/2024 Presented with serum glucose of 199. Insulin  coverage every 4 hours while NPO.  Hyperlipidemia Resume home statin  Hypertension Resume home Lopressor  Closely monitor vital signs.  Alzheimer's dementia with history of agitation Resume home regimen. Aricept  held due to transient bradycardia.  Permanent atrial fibrillation, no longer on Eliquis  due to chronic thrombocytopenia Currently rate controlled Home Lopressor  restarted. Closely monitor on telemetry.  History of complete heart block status post pacemaker placement No acute issues reported. Follows with cardiology outpatient.  Chronic thrombocytopenia Presented with platelet count of 66 K from 34 K, 6 days ago. Repeat CBC and follow-up platelet count May consider restarting home Eliquis  if platelet count continues to uptrend.  CKD 3B Appears to be at his baseline creatinine 1.4 with GFR 48. Avoid nephrotoxic agents and hypotension. Monitor urine output Repeat BMP in the morning  BPH Resume home tamsulosin  Monitor urine output.  Pressure wounds involving  heels, POA Wound care specialist was  consulted during recent admission 03/24/2024. Resume local wound care with wound care specialist guidance.  Cirrhotic liver morphology with moderate abdominopelvic ascites and splenomegaly, incidental finding seen on CT scan No abdominal pain or fever reported. Avoid hepatotoxic agents. Continue to monitor hemoglobin level and platelet count.  Constipation Moderate stool burden in the colon, seen on CT scan. Bowel regimen in place.  Right upper lobe nephrolithiasis, incidental finding, seen on CT scan Closely monitor and treat as indicated.  Goals of care DNR Palliative care medicine consulted to assist with establishing goals of care    Critical care time: 55 minutes.    DVT prophylaxis: Subcu Lovenox  daily.  Code Status: DNR/DNI.  Family Communication: None at bedside.  Disposition Plan: Admitted to telemetry unit.  Consults called: None.  Admission status: Inpatient status.   Status is: Inpatient The patient requires at least 2 midnights for further evaluation and treatment of present condition.   Terry LOISE Hurst MD [facility] Pager [phone]  If 7PM-7AM, please contact night-coverage [email or web address] Password [facility]  04/01/2024, 4:43 AM      [1]  Allergies Allergen Reactions   Contrast Media [Iodinated Contrast Media] Other (See Comments)    No reaction listed on MAR   Vioxx [Rofecoxib]     On pt MAR   Shellfish Allergy Itching and Rash   Sulfonamide Derivatives Itching and Rash   "

## 2024-04-01 NOTE — Evaluation (Signed)
 Clinical/Bedside Swallow Evaluation Patient Details  Name: Jay Campbell MRN: [id number] Date of Birth: [birth year]  Today's Date: 04/01/2024 Time: SLP Start Time (ACUTE ONLY): 1430 SLP Stop Time (ACUTE ONLY): 1459 SLP Time Calculation (min) (ACUTE ONLY): 29 min  Past Medical History:  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   Warfarin anticoagulation    Past Surgical History:  Past Surgical History:  Procedure Laterality Date   CATARACT EXTRACTION Right    COCHLEAR IMPLANT Right    HEMORRHOID BANDING     KNEE ARTHROSCOPY Right 1992   Dr Brenna    PACEMAKER GENERATOR CHANGE Bilateral 02/01/2013   Procedure: PACEMAKER GENERATOR CHANGE;  Surgeon: Lynwood JONETTA Kelsie, MD;  Location: [facility];  Service: Cardiovascular;  Laterality: Bilateral;   PACEMAKER PLACEMENT  2002; 2014   SJM implanted by Dr Waddell with AV nodal ablation performed; gen change to [employer] SR RF by Dr Kelsie 02-01-13   RETINAL LASER PROCEDURE Left    SLT LASER APPLICATION Left 12/14/2015   Procedure: SLT LASER APPLICATION;  Surgeon: Dow JULIANNA Burke, MD;  Location: [facility];  Service: Ophthalmology;  Laterality: Left;   HPI:  Jay Campbell is a 79 y.o. male with medical history significant for Alzheimer's dementia, history of agitation due to dementia, coronary artery disease, permanent atrial fibrillation previously on Eliquis , complete heart block status post pacemaker placement, chronic thrombocytopenia, type 2 diabetes, hypertension, hyperlipidemia, GERD, BPH, who presents to the ER via  EMS from SNF due to shortness of breath and generalized weakness.  The patient was discharged yesterday after being admitted for sepsis secondary to pneumonia and acute hypoxic respiratory failure requiring 2 L nasal cannula.  Staff at SNF reported the patient was more short of breath and lethargic than baseline today.  EMS was activated.  He received albuterol  nebs en route via EMS.     In the ER, hypoxic with O2 saturation of 87% on 2 L nasal cannula.  The patient is alert and confused in the setting of dementia.  Frail-appearing with pressure wounds in heels.  He is minimally verbal and unable to provide a history.       A chest x-ray revealed increasing left basilar infiltrates and small effusion.  Prominent cardiac shadow with stable pacemaker.  Lactic acid 3.9.  There was concern for sepsis.  Subsequently, his CT chest abdomen pelvis without contrast revealed left lower lobe and lingular consolidation with patchy bilateral ground glass opacities more consistent with multifocal pneumonia.  Compressive atelectasis versus additional pneumonia in the right lower lobe.  Mild to moderate bilateral pleural effusions, right greater than left. Pt was seen by SLP service on Thursday and was discharged over the weekend. BSE requested.    Assessment / Plan / Recommendation  Clinical Impression  Clinical swallow evaluation completed at bedside. Pt was seen by this SLP last week and he is more alert today, however continues to present with chest congestion and coughing with PO intake.  Pt did well with single ice chips presented by SLP and he expressed enjoying them. He exhibited immediate and delayed coughing after thin and nectar-thick liquids. Recommend NPO except for ice chips after oral care for comfort and ok for PO medication crushed as able in puree (can do whole in puree if needed) and plan for MBSS tomorrow if radiology and scheduling permits.   SLP Visit Diagnosis: Dysphagia, unspecified (R13.10)     Aspiration Risk  Moderate aspiration risk;Risk for inadequate nutrition/hydration    Diet Recommendation           Other Recommendations Oral Care Recommendations: Oral care prior to ice chip/H20;Staff/trained caregiver to provide oral care     Swallow Evaluation Recommendations Recommendations: NPO except meds;Ice chips PRN after oral care Medication Administration: Crushed with puree Oral care recommendations: Oral care before ice chips/water    Assistance Recommended at Discharge    Functional Status Assessment Patient has had a recent decline in their functional status and demonstrates the ability to make significant improvements in function in a reasonable and predictable amount of time.  Frequency and Duration min 2x/week  1 week       Prognosis Prognosis for improved oropharyngeal function: Fair Barriers to Reach Goals: Cognitive deficits      Swallow Study   General Date of Onset: 03/24/24 HPI: Jay Campbell is a 79 y.o. male with medical history significant for Alzheimer's dementia, history of agitation due to dementia, coronary artery disease, permanent atrial fibrillation previously on Eliquis , complete heart block status post pacemaker placement, chronic thrombocytopenia, type 2 diabetes, hypertension, hyperlipidemia, GERD, BPH, who presents to the ER via EMS from SNF due to shortness of breath and generalized weakness.  The patient was discharged yesterday after being admitted for sepsis secondary to pneumonia and acute hypoxic respiratory failure requiring 2 L nasal cannula.  Staff at SNF reported the patient was more short of breath and lethargic than baseline today.  EMS was activated.  He received albuterol  nebs en route via EMS.     In the ER, hypoxic with O2 saturation of 87% on 2 L nasal cannula.  The patient is alert and confused in the setting of dementia.  Frail-appearing with pressure wounds in heels.  He is minimally verbal and unable to provide a history.        A chest x-ray revealed increasing left basilar infiltrates and small effusion.  Prominent cardiac shadow with stable pacemaker.  Lactic acid 3.9.  There was concern for sepsis.  Subsequently, his CT chest abdomen pelvis without contrast revealed left lower lobe and lingular consolidation with patchy bilateral ground glass opacities more consistent with multifocal pneumonia.  Compressive atelectasis versus additional pneumonia in the right lower lobe.  Mild to moderate bilateral pleural effusions, right greater than left. Pt was seen by SLP service on Thursday and was discharged over the weekend. BSE requested. Type of Study: Bedside Swallow Evaluation Previous Swallow Assessment: BSE last week Diet Prior to this Study: NPO Temperature Spikes Noted: No Respiratory Status: Nasal cannula History of Recent Intubation: No Behavior/Cognition: Alert;Cooperative;Pleasant mood;Requires cueing Oral Cavity Assessment: Dry Oral Care Completed by SLP: Yes Oral Cavity - Dentition: Poor condition;Missing dentition Vision: Impaired for self-feeding Self-Feeding Abilities: Total assist Patient Positioning: Upright in bed Baseline Vocal Quality: Low vocal intensity Volitional Cough: Congested Volitional Swallow: Able to elicit    Oral/Motor/Sensory Function Overall Oral Motor/Sensory Function: Generalized oral weakness   Ice Chips Ice chips: Within functional limits Presentation: Spoon   Thin Liquid Thin  Liquid: Impaired Presentation: Cup;Spoon Oral Phase Impairments: Reduced lingual movement/coordination Pharyngeal  Phase Impairments: Suspected delayed Swallow;Cough - Immediate    Nectar Thick Nectar Thick Liquid: Impaired Presentation: Cup;Spoon;Straw Pharyngeal Phase Impairments: Suspected delayed Swallow;Throat Clearing - Delayed;Cough - Delayed   Honey Thick Honey Thick Liquid: Not tested   Puree Puree: Within functional limits Presentation: Spoon   Solid     Solid: Not tested     Thank  you,  Lamar Candy, CCC-SLP [phone]  Alizandra Loh 04/01/2024,3:10 PM

## 2024-04-01 NOTE — Progress Notes (Signed)
 PT Cancellation Note  Patient Details Name: Jay Campbell MRN: [id number] DOB: 07-21-1945   Cancelled Treatment:    Reason Eval/Treat Not Completed: PT screened, no needs identified, will sign off. Patient bed bound, at baseline.   8:15 AM, 04/01/24 Lynwood Music, MPT Physical Therapist with [facility] 336 [phone] office [phone] mobile phone

## 2024-04-01 NOTE — Plan of Care (Signed)
  Problem: Health Behavior/Discharge Planning: Goal: Ability to manage health-related needs will improve Outcome: Progressing   Problem: Pain Managment: Goal: General experience of comfort will improve and/or be controlled Outcome: Progressing   Problem: Safety: Goal: Ability to remain free from injury will improve Outcome: Progressing

## 2024-04-01 NOTE — Consult Note (Addendum)
 WOC Nurse Consult Note: Reason for Consult: Consult requested for right heel.  Performed remotely after review of progress notes and photos in the EMR.  Pt was noted to have a Stage 3 pressure injury which was present on admission, according to the bedside nurses' wound care flow sheet. Red and moist with dry, crusted loose peeling edges.   Pressure Injury POA: Yes Dressing procedure/placement/frequency: Float heels to reduce pressure. Topical treatment orders provided for bedside nurses to perform as follows to promote healing: Change dressing to right heel Q M/W/F as follows: apply Aquacel Soila # [phone]) to right heel wound, then cover with foam dressing. Moisten with NS to remove previous dressing each time.   Please re-consult if further assistance is needed.  Thank-you,   Stephane Fought MSN, RN, CWOCN, CWCN-[facility], CNS Contact Mon-Fri 0700-1500: [phone]

## 2024-04-01 NOTE — Hospital Course (Signed)
 79 y.o. male with medical history significant for Alzheimer's dementia, history of agitation due to dementia, coronary artery disease, permanent atrial fibrillation previously on Eliquis , complete heart block status post pacemaker placement, chronic thrombocytopenia, type 2 diabetes, hypertension, hyperlipidemia, GERD, BPH, who presents to the ER via EMS from SNF due to shortness of breath and generalized weakness.  The patient was discharged yesterday after being admitted for sepsis secondary to pneumonia and acute hypoxic respiratory failure requiring 2 L nasal cannula.  Staff at SNF reported the patient was more short of breath and lethargic than baseline today.  EMS was activated.  He received albuterol  nebs en route via EMS.   In the ER, hypoxic with O2 saturation of 87% on 2 L nasal cannula.  The patient is alert and confused in the setting of dementia.  Frail-appearing with pressure wounds in heels.  He is minimally verbal and unable to provide a history.     A chest x-ray revealed increasing left basilar infiltrates and small effusion.  Prominent cardiac shadow with stable pacemaker.  Lactic acid 3.9.  There was concern for sepsis.  Subsequently, his CT chest abdomen pelvis without contrast revealed left lower lobe and lingular consolidation with patchy bilateral ground glass opacities more consistent with multifocal pneumonia.  Compressive atelectasis versus additional pneumonia in the right lower lobe.  Mild to moderate bilateral pleural effusions, right greater than left.  Cirrhotic liver morphology with moderate abdominal pelvic ascites and splenomegaly.  Gastric wall thickening may be related to portal gastropathy or gastritis.  Right upper lobe nephrolithiasis.  Coronary artery disease, aortic atherosclerosis.  Sigmoid diverticulosis.  Moderate stool burden in the colon.   The patient received 1 dose of cefepime  and azithromycin  in the ER.  TRH, hospitalist service, was asked to admit for  worsening pneumonia.

## 2024-04-01 NOTE — Progress Notes (Signed)
*  PRELIMINARY RESULTS* Echocardiogram 2D Echocardiogram has been performed.  Jay Campbell 04/01/2024, 12:48 PM

## 2024-04-01 NOTE — ED Triage Notes (Signed)
 Pt bib [facility] from [facility], Per EMS staff reported that pt was more lethargic than normal and SOB. Pt was recently admitted with sepsis, AKI and respiratory failure, was d/c'd yesterday    5mg  albuterol  given PTA

## 2024-04-01 NOTE — Progress Notes (Addendum)
 Pt admitted from ED from facility [facility]. Pt alert to self. Unable to follow commands to close mouth for thermometer. Wounds, bruising, redness and scratches noted. See below. Metta RN 2nd nurse. Came from ED with oxygen  at 2L/[place] sating 87% increased to 4L/[place] to get to 96%. Condom cath placed due to ulcerations to scrotum. Tele box placed [id number] verified by Letina RN Pt pulling at devices. Mitts placed to prevent pt from removing IV and condom cath. Zithromax  ordered po in ED. Not given due to pt unable to follow commands not safe to swallow. Questioned if could be IV. RN stated on back order. NO diet order in at this time.    04/01/24 0409 04/01/24 0413 04/01/24 0414  Wound 03/24/24 1945 Pressure Injury Heel Left Deep Tissue Pressure Injury - Purple or maroon localized area of discolored intact skin or blood-filled blister due to damage of underlying soft tissue from pressure and/or shear.  Date First Assessed/Time First Assessed: 03/24/24 1945   Primary Wound Type: Pressure Injury  Location: Heel  Location Orientation: Left  Staging: Deep Tissue Pressure Injury - Purple or maroon localized area of discolored intact skin or blood-filled ...  Wound Image   --   --   Site / Wound Assessment Purple;Painful;Pink  --   --   Peri-wound Assessment Pink  --   --   Wound Length (cm) 5 cm  --   --   Wound Width (cm) 6 cm  --   --   Wound Surface Area (cm^2) 23.56 cm^2  --   --   Wound 04/01/24 0330 Pressure Injury Sacrum Medial Stage 2 -  Partial thickness loss of dermis presenting as a shallow open injury with a red, pink wound bed without slough.  Date First Assessed/Time First Assessed: 04/01/24 0330   Present on Original Admission: Yes  Primary Wound Type: Pressure Injury  Location: Sacrum  Location Orientation: Medial  Staging: Stage 2 -  Partial thickness loss of dermis presenting as a shal...  Wound Image  --    --   Wound Length (cm)  --  3 cm (on right. non open area left buttocks 3x3   redness over sacrum 6x6)  --   Wound Width (cm)  --  3 cm  --   Wound Surface Area (cm^2)  --  7.07 cm^2  --   Wound 04/01/24 0330 Other (Comment) Back Left;Upper  Date First Assessed/Time First Assessed: 04/01/24 0330   Primary Wound Type: (c) Other (Comment)  Location: Back  Location Orientation: Left;Upper  Wound Image  --    --   Wound Length (cm)  --  7 cm  --   Wound Width (cm)  --  0.1 cm  --   Wound Surface Area (cm^2)  --  0.55 cm^2  --   Wound 04/01/24 0330 Other (Comment) Scrotum Anterior  Date First Assessed/Time First Assessed: 04/01/24 0330   Present on Original Admission: Yes  Primary Wound Type: (c) Other (Comment)  Location: Scrotum  Location Orientation: Anterior  Wound Image  --    --   Wound Length (cm)  --   --  3 cm  Wound Width (cm)  --   --  3 cm  Wound Surface Area (cm^2)  --   --  7.07 cm^2

## 2024-04-01 NOTE — TOC Initial Note (Signed)
 Transition of Care [facility]) - Initial/Assessment Note    Patient Details  Name: Jay Campbell MRN: [id number] Date of Birth: Aug 08, 1945  [facility] CM/SW Contact:    Sharlyne Stabs, RN Phone Number: 04/01/2024, 11:18 AM  Clinical Narrative:       Patient discharged to [facility] yesterday and returned. Considered to be high risk for readmission.  CM spoke with Ozell at [facility], who confirms that pt is a long term care resident ,he is unsure why he returned. MD is ordering a Palliative consulted. Ozell confirmed he is not active with Palliative or Hospice at [facility]. Our Palliative NP will see the patient tomorrow to assist with discharge plan. and can return once medically stable. IPCM following.            Expected Discharge Plan: Long Term Acute Care (LTAC) Barriers to Discharge: Continued Medical Work up   Patient Goals and CMS Choice Patient states their goals for this hospitalization and ongoing recovery are:: Return to [facility].gov Compare Post Acute Care list provided to:: Patient Represenative (must comment) Choice offered to / list presented to :  T[place]iana at [facility])     Expected Discharge Plan and Services     Post Acute Care Choice: Nursing Home Living arrangements for the past 2 months: Skilled Nursing Facility          Prior Living Arrangements/Services Living arrangements for the past 2 months: Skilled Nursing Facility Lives with:: Facility Resident      Activities of Daily Living   ADL Screening (condition at time of admission) Independently performs ADLs?: No Does the patient have a NEW difficulty with bathing/dressing/toileting/self-feeding that is expected to last >3 days?: Yes (Initiates electronic notice to provider for possible OT consult) Does the patient have a NEW difficulty with getting in/out of bed, walking, or climbing stairs that is expected to last >3 days?: Yes (Initiates electronic notice to provider for possible PT  consult) Does the patient have a NEW difficulty with communication that is expected to last >3 days?: Yes (Initiates electronic notice to provider for possible SLP consult) Is the patient deaf or have difficulty hearing?: Yes Does the patient have difficulty seeing, even when wearing glasses/contacts?: No Does the patient have difficulty concentrating, remembering, or making decisions?: Yes  Permission Sought/Granted                  Emotional Assessment              Admission diagnosis:  Acute respiratory failure with hypoxia (HCC) [J96.01] Multifocal pneumonia [J18.8] Patient Active Problem List   Diagnosis Date Noted   Multifocal pneumonia 04/01/2024   Dehydration 03/31/2024   Pancytopenia (HCC) 03/31/2024   Hypernatremia 03/31/2024   Sepsis with acute hypoxic respiratory failure without septic shock (HCC) 03/31/2024   Protein-calorie malnutrition, severe 03/28/2024   Acute hypoxic respiratory failure (HCC) 03/24/2024   Iron deficiency anemia 01/11/2022   Acute metabolic encephalopathy 03/11/2018   Mild renal insufficiency 03/11/2018   Anxiety 03/11/2018   Insulin -requiring or dependent type II diabetes mellitus (HCC) 03/11/2018   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 [place] [address] Phone: [phone] Fax: [phone]  [facility] - [place], [place] - [address] [place] [address] Phone: [phone] Fax: [phone]  [facility] - [place], [place] - [address] [place]  [address] Phone: [phone] Fax: [phone]     Social Drivers of Health (SDOH) Social History: SDOH Screenings   Food Insecurity: Patient Unable To Answer (04/01/2024)  Housing: Patient Unable To Answer (04/01/2024)  Transportation Needs: Patient Unable To Answer (04/01/2024)  Utilities: Patient Unable To Answer (04/01/2024)  Financial Resource Strain: Low Risk (07/01/2023)   [facility] from [facility]  Social Connections: Patient Unable To Answer (04/01/2024)  Tobacco Use: Low Risk (04/01/2024)   SDOH Interventions:     Readmission Risk Interventions    04/01/2024   11:17 AM 03/31/2024    3:53 PM 03/25/2024    9:58 AM  Readmission Risk Prevention Plan  Transportation Screening Complete Complete Complete  Medication Review (RN Care Manager) Complete Complete Complete  PCP or Specialist appointment within 3-5 days of discharge Not Complete    HRI or Home Care Consult Complete  Complete  SW Recovery Care/Counseling Consult Complete Complete Complete  Palliative Care Screening Not Complete  Not Applicable  Skilled Nursing Facility Complete Complete Complete

## 2024-04-01 NOTE — Progress Notes (Signed)
 OT Cancellation Note  Patient Details Name: Jay Campbell MRN: [id number] DOB: November 16, 1945   Cancelled Treatment:    Reason Eval/Treat Not Completed: OT screened, no needs identified, will sign off. Pt bed bound and assisted at baseline. Pt will be removed from the OT list.   [name] OT, MOT   [name] 04/01/2024, 11:47 AM

## 2024-04-01 NOTE — ED Notes (Signed)
 Patient transported to CT

## 2024-04-01 NOTE — Progress Notes (Signed)
 " PROGRESS NOTE   Jay Campbell  FMW:[id number] DOB: 03-09-1946 DOA: 04/01/2024 PCP: [name], MD   Chief Complaint  Patient presents with   Shortness of Breath   Level of care: Telemetry  Brief Admission History:  79 y.o. male with medical history significant for Alzheimer's dementia, history of agitation due to dementia, coronary artery disease, permanent atrial fibrillation previously on Eliquis , complete heart block status post pacemaker placement, chronic thrombocytopenia, type 2 diabetes, hypertension, hyperlipidemia, GERD, BPH, who presents to the ER via EMS from SNF due to shortness of breath and generalized weakness.  The patient was discharged yesterday after being admitted for sepsis secondary to pneumonia and acute hypoxic respiratory failure requiring 2 L nasal cannula.  Staff at SNF reported the patient was more short of breath and lethargic than baseline today.  EMS was activated.  He received albuterol  nebs en route via EMS.   In the ER, hypoxic with O2 saturation of 87% on 2 L nasal cannula.  The patient is alert and confused in the setting of dementia.  Frail-appearing with pressure wounds in heels.  He is minimally verbal and unable to provide a history.     A chest x-ray revealed increasing left basilar infiltrates and small effusion.  Prominent cardiac shadow with stable pacemaker.  Lactic acid 3.9.  There was concern for sepsis.  Subsequently, his CT chest abdomen pelvis without contrast revealed left lower lobe and lingular consolidation with patchy bilateral ground glass opacities more consistent with multifocal pneumonia.  Compressive atelectasis versus additional pneumonia in the right lower lobe.  Mild to moderate bilateral pleural effusions, right greater than left.  Cirrhotic liver morphology with moderate abdominal pelvic ascites and splenomegaly.  Gastric wall thickening may be related to portal gastropathy or gastritis.  Right upper lobe nephrolithiasis.  Coronary  artery disease, aortic atherosclerosis.  Sigmoid diverticulosis.  Moderate stool burden in the colon.   The patient received 1 dose of cefepime  and azithromycin  in the ER.  TRH, hospitalist service, was asked to admit for worsening pneumonia.   Assessment and Plan:  Adult Failure to Thrive --unfortunately, despite maximal medical interventions and prolonged hospitalization 7 days he has bounced back into the hospital within 24 hours. -- Patient has significant dysphagia and unable to eat/drink enough to maintain body requirements for fluids and nutrition -- Agree with palliative consultation with expectation that he will transition to comfort measures -- continue DNR/DNI orders present on admission  Multifocal pneumonia, seen on CT scan Pneumonia was fully treated during previous hospitalization DC antibiotics No clinical signs or symptoms of pneumonia at this time Aspiration precautions  Severe Dysphagia -- SLP evaluation requested -- keep NPO with plan for MBS tomorrow -- aspiration precautions ordered   Bilateral mild to moderate pleural effusions, seen on CT scan Closely monitor volume status Early mobilization Incentive spirometer with close instructions.   Chronic hypoxic respiratory failure secondary to the above Recently started on 2 L O2 supplementation during previous admission for pneumonia. Unfortunately supplemental oxygen  wasn't provided at facility   Lactic acidosis Secondary to Hypoxia, hypoperfusion Lactic acid is trending down  Follow peripheral blood cultures x 2. IV fluid hydration  Sepsis has been ruled out   Elevated troponin, suspect demand ischemia secondary to the above History of coronary artery disease Aortic atherosclerosis High-sensitivity troponin 23, 26. No evidence of acute ischemia on twelve-lead EKG No chest pain reported. Monitor on telemetry.   Chronic HFpEF Last 2D echo done on 05/09/2019 revealed LVEF 60 to 65%  Presented with  dyspnea, cardiomegaly, and bilateral pleural effusions Follow-up repeat 2D echo. Monitor strict I's and O's and daily weight   Hypernatremia due to poor oral intake Speech therapy is consulted to rule out dysphagia Hypotonic IV fluid to correct free water  deficit Follow BMP    Gastric wall thickening, may be related to portal gastropathy or gastritis, seen on CT scan. IV PPI Protonix  40 mg daily No reported abdominal pain or GI bleed Avoid NSAIDs Can switch IV PPI to p.o. once passes a swallow evaluation.   Generalized weakness He is chronically debilitated and this is unchanged Palliative consultation requested    Type 2 diabetes with hyperglycemia Last hemoglobin A1c 7.3 on 03/24/2024 Presented with serum glucose of 199. Add glargine SSI coverage every 4 hours with frequent CBG monitoring. CBG (last 3)  Recent Labs    04/01/24 0049 04/01/24 0736 04/01/24 1106  GLUCAP 197* 227* 192*    Hyperlipidemia Resume home statin when able to take oral   Hypertension IV PRN agents if needed for elevated BPs   Alzheimer's dementia with history of agitation Resume home regimen. Aricept  held due to transient bradycardia.   Permanent atrial fibrillation, no longer on Eliquis  due to chronic thrombocytopenia Currently rate controlled Home Lopressor  restarted. Closely monitor on telemetry.   History of complete heart block status post pacemaker placement No acute issues reported. Follows with cardiology outpatient.   Chronic thrombocytopenia Presented with platelet count of 66 K from 34 K, 6 days ago. Repeat CBC and follow-up platelet count May consider restarting home Eliquis  if platelet count continues to uptrend.   CKD 3B Appears to be at his baseline creatinine 1.4 with GFR 48. Avoid nephrotoxic agents and hypotension. Monitor urine output Repeat BMP in the morning   BPH Resume home tamsulosin  Monitor urine output.   Pressure wounds involving heels, POA Wound care  specialist was consulted during recent admission 03/24/2024. Resume local wound care with wound care specialist guidance.   Cirrhotic liver morphology with moderate abdominopelvic ascites and splenomegaly, incidental finding seen on CT scan No abdominal pain or fever reported. Avoid hepatotoxic agents. Continue to monitor hemoglobin level and platelet count.   Constipation Moderate stool burden in the colon, seen on CT scan. Bowel regimen in place.   Right upper lobe nephrolithiasis, incidental finding, seen on CT scan Closely monitor and treat as indicated.   Goals of care DNR Palliative care medicine consulted to assist with establishing goals of care   DVT prophylaxis: enoxaparin  Code Status: DNR/DNI  Family Communication: none present during rounds Disposition: TBD    Consultants:  Palliative care Procedures:   Antimicrobials:    Subjective: Pt unable to answer questions due to chronic condition  Objective: Vitals:   04/01/24 0338 04/01/24 0341 04/01/24 0913 04/01/24 1304  BP:  (!) 155/93 (!) 158/69 (!) 162/67  Pulse:  (!) 59 63 68  Resp:  18 18 18   Temp:  98.4 F (36.9 C) (!) 97.5 F (36.4 C) (!) 97.5 F (36.4 C)  TempSrc:  Oral Axillary Axillary  SpO2: (!) 87% 95% 93% 94%  Weight:  79.7 kg    Height:  5' 5 (1.651 m)      Intake/Output Summary (Last 24 hours) at 04/01/2024 1355 Last data filed at 04/01/2024 1341 Gross per 24 hour  Intake 776.51 ml  Output 950 ml  Net -173.49 ml   Filed Weights   04/01/24 0341  Weight: 79.7 kg   Examination:  General exam: Appears demented, calm and moderately  distressed.  Respiratory system: no increased work of breathing. Cardiovascular system: normal S1 & S2 heard. No JVD, murmurs, rubs, gallops or clicks. No pedal edema. Gastrointestinal system: Abdomen is nondistended, soft and nontender. No organomegaly or masses felt. Normal bowel sounds heard. Central nervous system: Alert and disoriented. No focal  neurological deficits. Extremities: Symmetric 5 x 5 power. Skin: No rashes, lesions or ulcers. Psychiatry: Judgement and insight appear severely diminished. Mood & affect unable to determine.   Data Reviewed: I have personally reviewed following labs and imaging studies  CBC: Recent Labs  Lab 03/26/24 0324 04/01/24 0050  WBC 2.1* 6.9  NEUTROABS  --  5.8  HGB 8.1* 11.1*  HCT 23.8* 34.3*  MCV 103.0* 106.9*  PLT 34* 66*    Basic Metabolic Panel: Recent Labs  Lab 03/25/24 1630 03/26/24 0324 03/27/24 0237 03/27/24 0658 03/30/24 0357 03/31/24 0431 04/01/24 0050 04/01/24 0709 04/01/24 1150  NA 157*   < > 157*   < > 155* 156* 155* 155* 154*  K 4.1   < > 4.8   < > 4.3 3.5 3.8 3.6 3.3*  CL 129*   < > 125*   < > 123* 125* 121* 120* 121*  CO2 15*   < > 21*   < > 22 20* 22 24 22   GLUCOSE 289*   < > 260*   < > 248* 207* 199* 224* 213*  BUN 69*   < > 76*   < > 57* 54* 52* 49* 48*  CREATININE 1.77*   < > 1.66*   < > 1.35* 1.30* 1.46* 1.35* 1.28*  CALCIUM 8.1*   < > 8.1*   < > 8.4* 8.0* 8.5* 8.3* 8.0*  MG  --   --   --   --   --   --  2.1  --   --   PHOS 2.3*  --  3.7  --   --   --   --   --   --    < > = values in this interval not displayed.    CBG: Recent Labs  Lab 03/31/24 0741 03/31/24 1145 04/01/24 0049 04/01/24 0736 04/01/24 1106  GLUCAP 134* 230* 197* 227* 192*    Recent Results (from the past 240 hours)  Blood Culture (routine x 2)     Status: None   Collection Time: 03/24/24  7:00 AM   Specimen: BLOOD LEFT FOREARM  Result Value Ref Range Status   Specimen Description   Final    BLOOD LEFT FOREARM BOTTLES DRAWN AEROBIC AND ANAEROBIC   Special Requests Blood Culture adequate volume  Final   Culture   Final    NO GROWTH 5 DAYS Performed at [facility], [address], [place], [place] [address]    Report Status 03/29/2024 FINAL  Final  Blood Culture (routine x 2)     Status: None   Collection Time: 03/24/24  7:01 AM   Specimen: BLOOD RIGHT ARM  Result  Value Ref Range Status   Specimen Description   Final    BLOOD RIGHT ARM BOTTLES DRAWN AEROBIC AND ANAEROBIC   Special Requests Blood Culture adequate volume  Final   Culture   Final    NO GROWTH 5 DAYS Performed at [facility], [address] [place], [place] [address]    Report Status 03/29/2024 FINAL  Final  Resp panel by RT-PCR (RSV, Flu A&B, Covid) Anterior Nasal Swab     Status: None   Collection Time: 03/24/24  7:12 AM   Specimen: Anterior Nasal Swab  Result Value Ref Range Status   SARS Coronavirus 2 by RT PCR NEGATIVE NEGATIVE Final    Comment: (NOTE) SARS-CoV-2 target nucleic acids are NOT DETECTED.  The SARS-CoV-2 RNA is generally detectable in upper respiratory specimens during the acute phase of infection. The lowest concentration of SARS-CoV-2 viral copies this assay can detect is 138 copies/mL. A negative result does not preclude SARS-Cov-2 infection and should not be used as the sole basis for treatment or other patient management decisions. A negative result may occur with  improper specimen collection/handling, submission of specimen other than nasopharyngeal swab, presence of viral mutation(s) within the areas targeted by this assay, and inadequate number of viral copies(<138 copies/mL). A negative result must be combined with clinical observations, patient history, and epidemiological information. The expected result is Negative.  Fact Sheet for Patients:  bloggercourse.com  Fact Sheet for Healthcare Providers:  seriousbroker.it  This test is no t yet approved or cleared by the [place]  FDA and  has been authorized for detection and/or diagnosis of SARS-CoV-2 by FDA under an Emergency Use Authorization (EUA). This EUA will remain  in effect (meaning this test can be used) for the duration of the COVID-19 declaration under Section 564(b)(1) of the Act, 21 U.S.C.section 360bbb-3(b)(1), unless the  authorization is terminated  or revoked sooner.       Influenza A by PCR NEGATIVE NEGATIVE Final   Influenza B by PCR NEGATIVE NEGATIVE Final    Comment: (NOTE) The Xpert Xpress SARS-CoV-2/FLU/RSV plus assay is intended as an aid in the diagnosis of influenza from Nasopharyngeal swab specimens and should not be used as a sole basis for treatment. Nasal washings and aspirates are unacceptable for Xpert Xpress SARS-CoV-2/FLU/RSV testing.  Fact Sheet for Patients: bloggercourse.com  Fact Sheet for Healthcare Providers: seriousbroker.it  This test is not yet approved or cleared by the [place]  FDA and has been authorized for detection and/or diagnosis of SARS-CoV-2 by FDA under an Emergency Use Authorization (EUA). This EUA will remain in effect (meaning this test can be used) for the duration of the COVID-19 declaration under Section 564(b)(1) of the Act, 21 U.S.C. section 360bbb-3(b)(1), unless the authorization is terminated or revoked.     Resp Syncytial Virus by PCR NEGATIVE NEGATIVE Final    Comment: (NOTE) Fact Sheet for Patients: bloggercourse.com  Fact Sheet for Healthcare Providers: seriousbroker.it  This test is not yet approved or cleared by the [place]  FDA and has been authorized for detection and/or diagnosis of SARS-CoV-2 by FDA under an Emergency Use Authorization (EUA). This EUA will remain in effect (meaning this test can be used) for the duration of the COVID-19 declaration under Section 564(b)(1) of the Act, 21 U.S.C. section 360bbb-3(b)(1), unless the authorization is terminated or revoked.  Performed at [facility], [address], [place], [place] [address]   MRSA Next Gen by PCR, Nasal     Status: Abnormal   Collection Time: 03/24/24  8:48 AM   Specimen: Nasal Mucosa; Nasal Swab  Result Value Ref Range Status   MRSA by PCR Next Gen  DETECTED (A) NOT DETECTED Final    Comment: RESULT CALLED TO, READ BACK BY AND VERIFIED WITH: L HYLTON AT 1711 ON [id number] BY S DALTON (NOTE) The GeneXpert MRSA Assay (FDA approved for NASAL specimens only), is one component of a comprehensive MRSA colonization surveillance program. It is not intended to diagnose MRSA infection nor to guide or monitor treatment for MRSA  infections. Test performance is not FDA approved in patients less than 87 years old. Performed at [facility], [address] [place], [place] [address]   Blood Culture (routine x 2)     Status: None (Preliminary result)   Collection Time: 04/01/24 12:50 AM   Specimen: BLOOD RIGHT FOREARM  Result Value Ref Range Status   Specimen Description BLOOD RIGHT FOREARM  Final   Special Requests   Final    BOTTLES DRAWN AEROBIC AND ANAEROBIC Blood Culture adequate volume   Culture   Final    NO GROWTH < 12 HOURS Performed at [facility] [address], [place], [place] [address]    Report Status PENDING  Incomplete  Blood Culture (routine x 2)     Status: None (Preliminary result)   Collection Time: 04/01/24  1:39 AM   Specimen: Right Antecubital; Blood  Result Value Ref Range Status   Specimen Description RIGHT ANTECUBITAL  Final   Special Requests   Final    BOTTLES DRAWN AEROBIC AND ANAEROBIC Blood Culture adequate volume   Culture   Final    NO GROWTH < 12 HOURS Performed at [facility], [address] [place], [place] [address]    Report Status PENDING  Incomplete     Radiology Studies: CT CHEST ABDOMEN PELVIS WO CONTRAST Result Date: 04/01/2024 EXAM: CT CHEST, ABDOMEN AND PELVIS WITHOUT CONTRAST 04/01/2024 01:25:10 AM TECHNIQUE: CT of the chest, abdomen and pelvis was performed without the administration of intravenous contrast. Multiplanar reformatted images are provided for review. Automated exposure control, iterative reconstruction, and/or weight based adjustment of the mA/kV was utilized to reduce the  radiation dose to as low as reasonably achievable. COMPARISON: 02/07/2023. CLINICAL HISTORY: Sepsis. FINDINGS: CHEST: MEDIASTINUM AND LYMPH NODES: Cardiomegaly. Pacer wires in the right heart. Coronary artery aortic atherosclerosis. The central airways are clear. No mediastinal, hilar or axillary lymphadenopathy. LUNGS AND PLEURA: Mild to moderate bilateral pleural effusions, right greater than left. Consolidation with air bronchograms in the left lower lobe and posterior lingula concerning for pneumonia. Compressive atelectasis or pneumonia in the right lower lobe. Patchy ground glass airspace opacities also likely related to pneumonia. No pneumothorax. ABDOMEN AND PELVIS: LIVER: Mild nodular contours of the liver suggest cirrhosis. GALLBLADDER AND BILE DUCTS: High-density material within the gallbladder could reflect small stones or contrast material. No biliary ductal dilatation. SPLEEN: The spleen is mildly enlarged at 13 cm craniocaudal length. PANCREAS: No acute abnormality. ADRENAL GLANDS: No acute abnormality. KIDNEYS, URETERS AND BLADDER: 1.6 cm stone in the upper pole of the right kidney. No hydronephrosis. No perinephric or periureteral stranding. Urinary bladder is unremarkable. GI AND BOWEL: The stomach is decompressed but appears to be thick-walled. This could be related to portal gastropathy or gastritis. Sigmoid diverticulosis. No active diverticulitis. Moderate stool burden throughout the colon. There is no bowel obstruction. REPRODUCTIVE ORGANS: Prostate enlargement. PERITONEUM AND RETROPERITONEUM: Moderate free fluid in the abdomen and pelvis. No free air. VASCULATURE: Aorta is normal in caliber. Aortoiliac catheter sclerosis. ABDOMINAL AND PELVIS LYMPH NODES: No lymphadenopathy. BONES AND SOFT TISSUES: Stable severe compression fracture at L1. Diffuse osteopenia. No focal soft tissue abnormality. IMPRESSION: 1. Left lower lobe and lingular consolidation with patchy bilateral ground-glass  opacities, most consistent with multifocal pneumonia. 2. Compressive atelectasis versus additional pneumonia in the right lower lobe. 3. Mild to moderate bilateral pleural effusions, right greater than left. 4. Cirrhotic liver morphology with moderate abdominopelvic ascites and splenomegaly . 5. Gastric wall thickening may be related to portal gastropathy or  gastritis . 6. Right upper pole nephrolithiasis . 7. Coronary artery disease, aortic atherosclerosis. 8. Sigmoid diverticulosis. Moderate stool burden in the colon. Electronically signed by: Franky Crease MD 04/01/2024 01:36 AM EST RP Workstation: [id number]   DG Chest Port 1 View Result Date: 04/01/2024 EXAM: 1 VIEW(S) XRAY OF THE CHEST 04/01/2024 01:00:00 AM COMPARISON: Comparison from 03/24/2024. CLINICAL HISTORY: Increased lethargy and shortness of breath. FINDINGS: LUNGS AND PLEURA: Increasing left basilar infiltrate. Small left pleural effusion. No pneumothorax. HEART AND MEDIASTINUM: The cardiac shadow is prominent. The pacemaker is again seen and stable. BONES AND SOFT TISSUES: No bony abnormality is noted. IMPRESSION: 1. Increasing left basilar infiltrate and small effusion. 2. Prominent cardiac shadow with stable pacemaker. Electronically signed by: Oneil Devonshire MD 04/01/2024 01:04 AM EST RP Workstation: [id number]    Scheduled Meds:  ARIPiprazole   5 mg Oral q morning   azithromycin   500 mg Oral Once   divalproex   250 mg Oral BID   enoxaparin  (LOVENOX ) injection  40 mg Subcutaneous Q24H   famotidine   20 mg Oral BID   insulin  aspart  0-15 Units Subcutaneous Q4H   insulin  glargine  15 Units Subcutaneous Daily   memantine   10 mg Oral BID   pantoprazole  (PROTONIX ) IV  40 mg Intravenous Daily   simvastatin   40 mg Oral QPM   tamsulosin   0.4 mg Oral QPM   Continuous Infusions:  ceFEPIme  (MAXIPIME ) 2 g in sodium chloride  0.9 % 100 mL IVPB Stopped (04/01/24 1339)   dextrose  75 mL/hr at 04/01/24 1341   linezolid  (ZYVOX ) IV Stopped (04/01/24  0639)     LOS: 0 days   Time spent: 57 mins  Lory Nowaczyk Vicci, MD How to contact the [facility] Attending or Consulting provider 7A - 7P or covering provider during after hours 7P -7A, for this patient?  Check the care team in [facility] and look for a) attending/consulting TRH provider listed and b) the TRH team listed Log into [email or web address] to find provider on call.  Locate the TRH provider you are looking for under [facility] and page to a number that you can be directly reached. If you still have difficulty reaching the provider, please page the [facility] (Director on Call) for the Hospitalists listed on amion for assistance.  04/01/2024, 1:55 PM    "

## 2024-04-01 NOTE — ED Provider Notes (Addendum)
 " [facility] EMERGENCY DEPARTMENT AT [facility] Provider Note   CSN: [id number] Arrival date & time: 04/01/24  0032     Patient presents with: Shortness of Breath   Jay Campbell is a 79 y.o. male.   HPI Patient presents for shortness of breath and generalized weakness.  Medical history includes dementia, anxiety, atrial fibrillation, CAD, DM, GERD, HLD, HTN, pacemaker placement.  A week ago, he was admitted for sepsis secondary to pneumonia with acute hypoxic respiratory failure.  He was treated with antibiotics and IV fluids.  There was a questionable UTI.  He underwent steroid taper.  His AKI resolved.  He was discharged from the hospital yesterday.  He arrives today from nursing facility with limited history.  Per EMS, he was reportedly short of breath with generalized weakness.  With EMS, initial SpO2 was in the low 90s on room air.  During transit, he did have desaturations and was placed on supplemental oxygen .  Patient is unable to provide any history.    Prior to Admission medications  Medication Sig Start Date End Date Taking? Authorizing Provider  acetaminophen  (TYLENOL ) 650 MG CR tablet Take 650 mg by mouth every 8 (eight) hours as needed for pain.    [provider]  allopurinol  (ZYLOPRIM ) 100 MG tablet Take 100 mg by mouth in the morning. 12/15/11   [provider]  Amino Acids-Protein Hydrolys (FEEDING SUPPLEMENT, PRO-STAT 64,) LIQD Take 30 mLs by mouth in the morning.    [provider]  ARIPiprazole  (ABILIFY ) 5 MG tablet Take 5 mg by mouth every morning. 11/05/23   [provider]  ascorbic acid (VITAMIN C) 500 MG tablet Take 500 mg by mouth 2 (two) times daily.    [provider]  BD INSULIN  SYRINGE U/F 31G X 5/16 0.3 ML MISC  06/26/19   [provider]  Calcium-Vitamins C & D 500-10-250 MG-MG-UNIT CHEW Chew 1 tablet by mouth 2 (two) times daily.    [provider]  cetirizine  (ZYRTEC ) 10 MG tablet  Take 10 mg by mouth every morning.    [provider]  Continuous Blood Gluc Sensor (FREESTYLE LIBRE 14 DAY SENSOR) MISC  06/02/21   [provider]  divalproex  (DEPAKOTE  SPRINKLE) 125 MG capsule Take 125 mg by mouth daily in the afternoon. 1400    [provider]  divalproex  (DEPAKOTE  SPRINKLE) 125 MG capsule Take 250 mg by mouth 2 (two) times daily. 0800 and 2000    [provider]  donepezil  (ARICEPT ) 10 MG tablet Take 1 tablet (10 mg total) by mouth at bedtime. 01/13/22   Whitfield Raisin, NP  famotidine  (PEPCID ) 20 MG tablet Take 20 mg by mouth 2 (two) times daily. 07/05/19   [provider]  ferrous sulfate  325 (65 FE) MG EC tablet Take 325 mg by mouth 3 (three) times daily.    [provider]  guaifenesin  (HUMIBID E) 400 MG TABS tablet Take 400 mg by mouth every 12 (twelve) hours.    [provider]  HUMULIN 70/30 (70-30) 100 UNIT/ML injection Inject 30 Units into the skin in the morning. 05/25/21   [provider]  latanoprost  (XALATAN ) 0.005 % ophthalmic solution Place 1 drop into both eyes at bedtime. 02/23/21   [provider]  LORazepam  (ATIVAN ) 0.5 MG tablet Take 1 tablet (0.5 mg total) by mouth 2 (two) times daily. 03/31/24   Ricky Fines, MD  memantine  (NAMENDA ) 10 MG tablet TAKE 1 TABLET BY MOUTH TWICE  DAILY  11/02/21   Whitfield Raisin, NP  metoprolol  tartrate (LOPRESSOR ) 25 MG tablet Take 25 mg by mouth 2 (two) times daily.    [provider]  Multiple Vitamin (MULTIVITAMIN) tablet Take 1 tablet by mouth in the morning.    [provider]  Nutritional Supplements (BOOST GLUCOSE CONTROL PO) Take 237 mLs by mouth 2 (two) times daily between meals.    [provider]  [facility] VERIO test strip  08/17/17   [provider]  pantoprazole  (PROTONIX ) 40 MG tablet Take 1 tablet (40 mg total) by mouth daily. 04/01/24   Ricky Fines, MD  predniSONE  (DELTASONE ) 20 MG tablet Take 2  tablets by mouth daily x 1 day; then 1 tablet by mouth daily x 2 days; then half tablet by mouth daily x 3 days and stop prednisone . 04/01/24   Ricky Fines, MD  simvastatin  (ZOCOR ) 40 MG tablet Take 40 mg by mouth every evening.    [provider]  tamsulosin  (FLOMAX ) 0.4 MG CAPS capsule Take 0.4 mg by mouth every evening. 09/03/20   [provider]  Water  For Irrigation, Sterile (FREE WATER ) SOLN Take 125 mLs by mouth every 8 (eight) hours. 03/31/24   Ricky Fines, MD  Zinc 220 (50 Zn) MG CAPS Take 1 capsule by mouth every morning.    [provider]    Allergies: Contrast media [iodinated contrast media], Vioxx [rofecoxib], Shellfish allergy, and Sulfonamide derivatives    Review of Systems  Unable to perform ROS: Dementia    Updated Vital Signs BP (!) 155/73   Pulse 65   Temp 98.4 F (36.9 C)   Resp 18   SpO2 96%   Physical Exam Vitals and nursing note reviewed.  Constitutional:      General: He is not in acute distress.    Appearance: He is well-developed. He is ill-appearing (Chronically). He is not toxic-appearing or diaphoretic.  HENT:     Head: Normocephalic and atraumatic.  Eyes:     Conjunctiva/sclera: Conjunctivae normal.  Cardiovascular:     Rate and Rhythm: Normal rate and regular rhythm.     Heart sounds: No murmur heard. Pulmonary:     Effort: Pulmonary effort is normal. No respiratory distress.     Breath sounds: Examination of the left-upper field reveals decreased breath sounds. Examination of the left-middle field reveals decreased breath sounds. Examination of the left-lower field reveals decreased breath sounds. Decreased breath sounds present.  Chest:     Chest wall: No tenderness.  Abdominal:     Palpations: Abdomen is soft.     Tenderness: There is abdominal tenderness.  Musculoskeletal:        General: No swelling.     Cervical back: Neck supple.  Skin:    General: Skin is warm and dry.     Coloration: Skin is not  cyanotic or pale.  Neurological:     General: No focal deficit present.     Mental Status: He is alert. He is disoriented.  Psychiatric:        Mood and Affect: Mood normal.     (all labs ordered are listed, but only abnormal results are displayed) Labs Reviewed  LACTIC ACID, PLASMA - Abnormal; Notable for the following components:      Result Value   Lactic Acid, Venous 3.9 (*)    All other components within normal limits  COMPREHENSIVE METABOLIC PANEL WITH GFR - Abnormal; Notable for the following components:   Sodium 155 (*)    Chloride 121 (*)  Glucose, Bld 199 (*)    BUN 52 (*)    Creatinine, Ser 1.46 (*)    Calcium 8.5 (*)    Albumin 3.0 (*)    GFR, Estimated 48 (*)    All other components within normal limits  CBC WITH DIFFERENTIAL/PLATELET - Abnormal; Notable for the following components:   RBC 3.21 (*)    Hemoglobin 11.1 (*)    HCT 34.3 (*)    MCV 106.9 (*)    MCH 34.6 (*)    Platelets 66 (*)    Abs Immature Granulocytes 0.08 (*)    All other components within normal limits  PRO BRAIN NATRIURETIC PEPTIDE - Abnormal; Notable for the following components:   Pro Brain Natriuretic Peptide 2,535.0 (*)    All other components within normal limits  CBG MONITORING, ED - Abnormal; Notable for the following components:   Glucose-Capillary 197 (*)    All other components within normal limits  TROPONIN T, HIGH SENSITIVITY - Abnormal; Notable for the following components:   Troponin T High Sensitivity 23 (*)    All other components within normal limits  CULTURE, BLOOD (ROUTINE X 2)  CULTURE, BLOOD (ROUTINE X 2)  MAGNESIUM  LACTIC ACID, PLASMA  URINALYSIS, W/ REFLEX TO CULTURE (INFECTION SUSPECTED)  BETA-HYDROXYBUTYRIC ACID  I-STAT CHEM 8, ED    EKG: EKG Interpretation Date/Time:  Monday April 01 2024 00:53:53 EST Ventricular Rate:  60 PR Interval:  200 QRS Duration:  166 QT Interval:  481 QTC Calculation: 481 R Axis:   -66  Text  Interpretation: VENTRICULAR PACED RHYTHM Nonspecific IVCD with LAD Left ventricular hypertrophy Confirmed by Melvenia Motto ([id number]) on 04/01/2024 12:57:00 AM  Radiology: CT CHEST ABDOMEN PELVIS WO CONTRAST Result Date: 04/01/2024 EXAM: CT CHEST, ABDOMEN AND PELVIS WITHOUT CONTRAST 04/01/2024 01:25:10 AM TECHNIQUE: CT of the chest, abdomen and pelvis was performed without the administration of intravenous contrast. Multiplanar reformatted images are provided for review. Automated exposure control, iterative reconstruction, and/or weight based adjustment of the mA/kV was utilized to reduce the radiation dose to as low as reasonably achievable. COMPARISON: 02/07/2023. CLINICAL HISTORY: Sepsis. FINDINGS: CHEST: MEDIASTINUM AND LYMPH NODES: Cardiomegaly. Pacer wires in the right heart. Coronary artery aortic atherosclerosis. The central airways are clear. No mediastinal, hilar or axillary lymphadenopathy. LUNGS AND PLEURA: Mild to moderate bilateral pleural effusions, right greater than left. Consolidation with air bronchograms in the left lower lobe and posterior lingula concerning for pneumonia. Compressive atelectasis or pneumonia in the right lower lobe. Patchy ground glass airspace opacities also likely related to pneumonia. No pneumothorax. ABDOMEN AND PELVIS: LIVER: Mild nodular contours of the liver suggest cirrhosis. GALLBLADDER AND BILE DUCTS: High-density material within the gallbladder could reflect small stones or contrast material. No biliary ductal dilatation. SPLEEN: The spleen is mildly enlarged at 13 cm craniocaudal length. PANCREAS: No acute abnormality. ADRENAL GLANDS: No acute abnormality. KIDNEYS, URETERS AND BLADDER: 1.6 cm stone in the upper pole of the right kidney. No hydronephrosis. No perinephric or periureteral stranding. Urinary bladder is unremarkable. GI AND BOWEL: The stomach is decompressed but appears to be thick-walled. This could be related to portal gastropathy or gastritis. Sigmoid  diverticulosis. No active diverticulitis. Moderate stool burden throughout the colon. There is no bowel obstruction. REPRODUCTIVE ORGANS: Prostate enlargement. PERITONEUM AND RETROPERITONEUM: Moderate free fluid in the abdomen and pelvis. No free air. VASCULATURE: Aorta is normal in caliber. Aortoiliac catheter sclerosis. ABDOMINAL AND PELVIS LYMPH NODES: No lymphadenopathy. BONES AND SOFT TISSUES: Stable severe compression fracture at L1. Diffuse osteopenia.  No focal soft tissue abnormality. IMPRESSION: 1. Left lower lobe and lingular consolidation with patchy bilateral ground-glass opacities, most consistent with multifocal pneumonia. 2. Compressive atelectasis versus additional pneumonia in the right lower lobe. 3. Mild to moderate bilateral pleural effusions, right greater than left. 4. Cirrhotic liver morphology with moderate abdominopelvic ascites and splenomegaly . 5. Gastric wall thickening may be related to portal gastropathy or gastritis . 6. Right upper pole nephrolithiasis . 7. Coronary artery disease, aortic atherosclerosis. 8. Sigmoid diverticulosis. Moderate stool burden in the colon. Electronically signed by: Franky Crease MD 04/01/2024 01:36 AM EST RP Workstation: [id number]   DG Chest Port 1 View Result Date: 04/01/2024 EXAM: 1 VIEW(S) XRAY OF THE CHEST 04/01/2024 01:00:00 AM COMPARISON: Comparison from 03/24/2024. CLINICAL HISTORY: Increased lethargy and shortness of breath. FINDINGS: LUNGS AND PLEURA: Increasing left basilar infiltrate. Small left pleural effusion. No pneumothorax. HEART AND MEDIASTINUM: The cardiac shadow is prominent. The pacemaker is again seen and stable. BONES AND SOFT TISSUES: No bony abnormality is noted. IMPRESSION: 1. Increasing left basilar infiltrate and small effusion. 2. Prominent cardiac shadow with stable pacemaker. Electronically signed by: Oneil Devonshire MD 04/01/2024 01:04 AM EST RP Workstation: [id number]     Procedures   Medications Ordered in the ED   ceFEPIme  (MAXIPIME ) 2 g in sodium chloride  0.9 % 100 mL IVPB (2 g Intravenous New Bag/Given 04/01/24 0143)  azithromycin  (ZITHROMAX ) tablet 500 mg (500 mg Oral Not Given 04/01/24 0145)  furosemide  (LASIX ) injection 40 mg (40 mg Intravenous Given 04/01/24 0142)                                    Medical Decision Making Amount and/or Complexity of Data Reviewed Labs: ordered. Radiology: ordered.  Risk Prescription drug management. Decision regarding hospitalization.   This patient presents to the ED for concern of shortness of breath, this involves an extensive number of treatment options, and is a complaint that carries with it a high risk of complications and morbidity.  The differential diagnosis includes pneumonia, pulmonary edema, reactive airway disease, CHF, acidosis, anemia   Co morbidities / Chronic conditions that complicate the patient evaluation  dementia, anxiety, atrial fibrillation, CAD, DM, GERD, HLD, HTN, pacemaker placement   Additional history obtained:  Additional history obtained from EMR External records from outside source obtained and reviewed including EMS   Lab Tests:  I Ordered, and personally interpreted labs.  The pertinent results include: Baseline hemoglobin.  WBC has increased when compared to his baseline leukopenia.,  Cytopenia consistent with prior lab work.  Patient's creatinine and hyponatremia also consistent with recent lab work.  Slight elevation in troponin with mild elevation in BNP.   Imaging Studies ordered:  I ordered imaging studies including chest x-ray, CT of chest, abdomen, pelvis I independently visualized and interpreted imaging which showed multifocal pneumonia, bilateral pleural effusions, evidence of cirrhosis with abdominal pelvic ascites. I agree with the radiologist interpretation   Cardiac Monitoring: / EKG:  The patient was maintained on a cardiac monitor.  I personally viewed and interpreted the cardiac monitored  which showed an underlying rhythm of: Paced rhythm   Problem List / ED Course / Critical interventions / Medication management  Patient presents for reported concern at nursing facility of shortness of breath and generalized weakness.  Due to his dementia, he is unable to provide any history.  On arrival, he is awake and alert.  He is moving all extremities.  He does have abdominal tenderness.  He does have diminished breath sounds on left hemithorax.  Workup was initiated.  Imaging studies show evidence of multifocal pneumonia.  Antibiotics were ordered.  Patient also has evidence of ascites and bilateral pleural effusions.  Dose of Lasix  was ordered for possible hypervolemia after he was aggressively treated with IV fluids during his recent admission. Patient's pacemaker was interrogated and [facility] called stating that there were no abnormal findings.    On reassessment, patient remains on 2 L of supplemental oxygen  with SpO2 in the low to mid 90s.  Patient to be admitted for further management. I ordered medication including Lasix  for diuresis; cefepime  and azithromycin  for multifocal pneumonia Reevaluation of the patient after these medicines showed that the patient stayed the same I have reviewed the patients home medicines and have made adjustments as needed  Social Determinants of Health:  Has dementia and resides in nursing facility     Final diagnoses:  Multifocal pneumonia  Acute respiratory failure with hypoxia [facility])    ED Discharge Orders     None          Melvenia Motto, MD 04/01/24 LUNDY    Melvenia Motto, MD 04/01/24 0225  "

## 2024-04-01 NOTE — ED Notes (Signed)
 Update on pts disposition given to nurse at [facility]

## 2024-04-02 ENCOUNTER — Encounter (HOSPITAL_COMMUNITY): Payer: Self-pay | Admitting: Internal Medicine

## 2024-04-02 ENCOUNTER — Inpatient Hospital Stay (HOSPITAL_COMMUNITY)

## 2024-04-02 DIAGNOSIS — Z515 Encounter for palliative care: Secondary | ICD-10-CM | POA: Diagnosis not present

## 2024-04-02 DIAGNOSIS — D472 Monoclonal gammopathy: Secondary | ICD-10-CM

## 2024-04-02 DIAGNOSIS — I4821 Permanent atrial fibrillation: Secondary | ICD-10-CM | POA: Diagnosis not present

## 2024-04-02 DIAGNOSIS — E860 Dehydration: Secondary | ICD-10-CM | POA: Diagnosis not present

## 2024-04-02 DIAGNOSIS — J188 Other pneumonia, unspecified organism: Secondary | ICD-10-CM | POA: Diagnosis not present

## 2024-04-02 DIAGNOSIS — E43 Unspecified severe protein-calorie malnutrition: Secondary | ICD-10-CM

## 2024-04-02 DIAGNOSIS — E870 Hyperosmolality and hypernatremia: Secondary | ICD-10-CM | POA: Diagnosis not present

## 2024-04-02 DIAGNOSIS — R627 Adult failure to thrive: Principal | ICD-10-CM

## 2024-04-02 DIAGNOSIS — Z7189 Other specified counseling: Secondary | ICD-10-CM

## 2024-04-02 DIAGNOSIS — Z66 Do not resuscitate: Secondary | ICD-10-CM | POA: Diagnosis not present

## 2024-04-02 LAB — CBC WITH DIFFERENTIAL/PLATELET
Abs Immature Granulocytes: 0.02 K/uL (ref 0.00–0.07)
Basophils Absolute: 0 K/uL (ref 0.0–0.1)
Basophils Relative: 0 %
Eosinophils Absolute: 0.1 K/uL (ref 0.0–0.5)
Eosinophils Relative: 2 %
HCT: 33.6 % — ABNORMAL LOW (ref 39.0–52.0)
Hemoglobin: 10.7 g/dL — ABNORMAL LOW (ref 13.0–17.0)
Immature Granulocytes: 0 %
Lymphocytes Relative: 12 %
Lymphs Abs: 0.6 K/uL — ABNORMAL LOW (ref 0.7–4.0)
MCH: 34.5 pg — ABNORMAL HIGH (ref 26.0–34.0)
MCHC: 31.8 g/dL (ref 30.0–36.0)
MCV: 108.4 fL — ABNORMAL HIGH (ref 80.0–100.0)
Monocytes Absolute: 0.2 K/uL (ref 0.1–1.0)
Monocytes Relative: 3 %
Neutro Abs: 4.3 K/uL (ref 1.7–7.7)
Neutrophils Relative %: 83 %
Platelets: 43 K/uL — ABNORMAL LOW (ref 150–400)
RBC: 3.1 MIL/uL — ABNORMAL LOW (ref 4.22–5.81)
RDW: 14.9 % (ref 11.5–15.5)
WBC: 5.2 K/uL (ref 4.0–10.5)
nRBC: 0 % (ref 0.0–0.2)

## 2024-04-02 LAB — GLUCOSE, CAPILLARY
Glucose-Capillary: 96 mg/dL (ref 70–99)
Glucose-Capillary: 99 mg/dL (ref 70–99)
Glucose-Capillary: 149 mg/dL — ABNORMAL HIGH (ref 70–99)
Glucose-Capillary: 200 mg/dL — ABNORMAL HIGH (ref 70–99)
Glucose-Capillary: 179 mg/dL — ABNORMAL HIGH (ref 70–99)

## 2024-04-02 LAB — BASIC METABOLIC PANEL WITH GFR
Anion gap: 11 (ref 5–15)
BUN: 41 mg/dL — ABNORMAL HIGH (ref 8–23)
CO2: 20 mmol/L — ABNORMAL LOW (ref 22–32)
Calcium: 7.7 mg/dL — ABNORMAL LOW (ref 8.9–10.3)
Chloride: 119 mmol/L — ABNORMAL HIGH (ref 98–111)
Creatinine, Ser: 1.17 mg/dL (ref 0.61–1.24)
GFR, Estimated: >60 mL/min
Glucose, Bld: 101 mg/dL — ABNORMAL HIGH (ref 70–99)
Potassium: 3.6 mmol/L (ref 3.5–5.1)
Sodium: 150 mmol/L — ABNORMAL HIGH (ref 135–145)

## 2024-04-02 LAB — PHOSPHORUS: Phosphorus: 2.9 mg/dL (ref 2.5–4.6)

## 2024-04-02 LAB — MAGNESIUM: Magnesium: 1.8 mg/dL (ref 1.7–2.4)

## 2024-04-02 MED ORDER — DEXTROSE 5 % IV SOLN: INTRAVENOUS | Status: AC

## 2024-04-02 NOTE — Progress Notes (Signed)
 " PROGRESS NOTE   Jay Campbell  FMW:[id number] DOB: 10-28-1945 DOA: 04/01/2024 PCP: [name], MD   Chief Complaint  Patient presents with   Shortness of Breath   Level of care: Telemetry  Brief Admission History:  79 y.o. male with medical history significant for Alzheimer's dementia, history of agitation due to dementia, coronary artery disease, permanent atrial fibrillation previously on Eliquis , complete heart block status post pacemaker placement, chronic thrombocytopenia, type 2 diabetes, hypertension, hyperlipidemia, GERD, BPH, who presents to the ER via EMS from SNF due to shortness of breath and generalized weakness.  The patient was discharged yesterday after being admitted for sepsis secondary to pneumonia and acute hypoxic respiratory failure requiring 2 L nasal cannula.  Staff at SNF reported the patient was more short of breath and lethargic than baseline today.  EMS was activated.  He received albuterol  nebs en route via EMS.   In the ER, hypoxic with O2 saturation of 87% on 2 L nasal cannula.  The patient is alert and confused in the setting of dementia.  Frail-appearing with pressure wounds in heels.  He is minimally verbal and unable to provide a history.     A chest x-ray revealed increasing left basilar infiltrates and small effusion.  Prominent cardiac shadow with stable pacemaker.  Lactic acid 3.9.  There was concern for sepsis.  Subsequently, his CT chest abdomen pelvis without contrast revealed left lower lobe and lingular consolidation with patchy bilateral ground glass opacities more consistent with multifocal pneumonia.  Compressive atelectasis versus additional pneumonia in the right lower lobe.  Mild to moderate bilateral pleural effusions, right greater than left.  Cirrhotic liver morphology with moderate abdominal pelvic ascites and splenomegaly.  Gastric wall thickening may be related to portal gastropathy or gastritis.  Right upper lobe nephrolithiasis.  Coronary  artery disease, aortic atherosclerosis.  Sigmoid diverticulosis.  Moderate stool burden in the colon.   The patient received 1 dose of cefepime  and azithromycin  in the ER.  TRH, hospitalist service, was asked to admit for worsening pneumonia.   Assessment and Plan:  Adult Failure to Thrive -- unfortunately, despite maximal medical interventions and recent prolonged hospitalization of 7 days he has bounced back into the hospital within 24 hours. -- Patient has significant dysphagia and unable to eat/drink enough to maintain body requirements for fluids and nutrition -- Agree with palliative consultation with expectation that he will transition to comfort measures -- continue DNR/DNI orders present on admission  Multifocal pneumonia, seen on CT scan -- Pneumonia was fully treated during previous hospitalization -- DC antibiotics -- No clinical signs or symptoms of pneumonia at this time -- Aspiration precautions  Severe Dysphagia -- SLP evaluation requested -- keep NPO with plan for MBS tomorrow -- aspiration precautions ordered   Bilateral mild to moderate pleural effusions, seen on CT scan -- Closely monitor volume status -- Early mobilization -- Incentive spirometer with close instructions.   Chronic hypoxic respiratory failure secondary to the above -- Recently started on 2 L O2 supplementation during previous admission for pneumonia. -- Unfortunately supplemental oxygen  wasn't provided at facility   Lactic acidosis -- Secondary to Hypoxia, hypoperfusion -- Lactic acid is trending down  -- Follow peripheral blood cultures x 2. -- IV fluid hydration  -- Sepsis has been ruled out   Elevated troponin, suspect demand ischemia secondary to the above History of coronary artery disease Aortic atherosclerosis High-sensitivity troponin 23, 26. No evidence of acute ischemia on twelve-lead EKG No chest pain reported. Monitor  on telemetry.   Chronic HFpEF Last 2D echo done on  05/09/2019 revealed LVEF 60 to 65% Presented with dyspnea, cardiomegaly, and bilateral pleural effusions Follow-up repeat 2D echo. Monitor strict I's and O's and daily weight   Hypernatremia due to poor oral intake Speech therapy is consulted to rule out dysphagia Hypotonic IV fluid to correct free water  deficit Follow BMP    Gastric wall thickening, may be related to portal gastropathy or gastritis, seen on CT scan. IV PPI Protonix  40 mg daily No reported abdominal pain or GI bleed Avoid NSAIDs Can switch IV PPI to p.o. once passes a swallow evaluation.   Generalized weakness He is chronically debilitated and this is unchanged Palliative consultation requested    Type 2 diabetes with hyperglycemia Last hemoglobin A1c 7.3 on 03/24/2024 Presented with serum glucose of 199. Add glargine SSI coverage every 4 hours with frequent CBG monitoring. CBG (last 3)  Recent Labs    04/02/24 0404 04/02/24 0722 04/02/24 1207  GLUCAP 96 99 149*    Hyperlipidemia Resume home statin when able to take oral   Hypertension IV PRN agents if needed for elevated BPs   Alzheimer's dementia with history of agitation Resume home regimen. Aricept  held due to transient bradycardia.   Permanent atrial fibrillation, no longer on Eliquis  due to chronic thrombocytopenia Currently rate controlled Home Lopressor  restarted. Closely monitor on telemetry.   History of complete heart block status post pacemaker placement No acute issues reported. Follows with cardiology outpatient.   Chronic thrombocytopenia Presented with platelet count of 66 K from 34 K, 6 days ago. Repeat CBC and follow-up platelet count May consider restarting home Eliquis  if platelet count continues to uptrend.   CKD 3B Appears to be at his baseline creatinine 1.4 with GFR 48. Avoid nephrotoxic agents and hypotension. Monitor urine output Repeat BMP in the morning   BPH Resume home tamsulosin  Monitor urine output.    Pressure wounds involving heels, POA Wound care specialist was consulted during recent admission 03/24/2024. Resume local wound care with wound care specialist guidance.   Cirrhotic liver morphology with moderate abdominopelvic ascites and splenomegaly, incidental finding seen on CT scan No abdominal pain or fever reported. Avoid hepatotoxic agents. Continue to monitor hemoglobin level and platelet count.   Constipation Moderate stool burden in the colon, seen on CT scan. Bowel regimen in place.   Right upper lobe nephrolithiasis, incidental finding, seen on CT scan Closely monitor and treat as indicated.   Goals of care DNR Palliative care medicine consulted to assist with establishing goals of care   DVT prophylaxis: enoxaparin  Code Status: DNR/DNI  Family Communication: none present during rounds Disposition: TBD    Consultants:  Palliative care Procedures:   Antimicrobials:    Subjective: Pt unable to answer questions due to current condition.  He is more alert today.   Objective: Vitals:   04/01/24 1700 04/01/24 2027 04/02/24 0556 04/02/24 1142  BP: (!) 132/58 (!) 151/64 (!) 143/90 137/61  Pulse: 60 (!) 59 60 66  Resp: 16 16  16   Temp: 97.7 F (36.5 C) 97.7 F (36.5 C) 97.8 F (36.6 C) 97.6 F (36.4 C)  TempSrc: Axillary Axillary Oral Axillary  SpO2: 94% 97% 95% 95%  Weight:   76 kg   Height:        Intake/Output Summary (Last 24 hours) at 04/02/2024 1238 Last data filed at 04/02/2024 0921 Gross per 24 hour  Intake 1889.95 ml  Output 1250 ml  Net 639.95 ml  Filed Weights   04/01/24 0341 04/02/24 0556  Weight: 79.7 kg 76 kg   Examination:  General exam: Appears severely demented, calm and moderately distressed. Dry mucus membranes.  Respiratory system: no increased work of breathing. Cardiovascular system: normal S1 & S2 heard. No JVD, murmurs, rubs, gallops or clicks. No pedal edema. Gastrointestinal system: Abdomen is nondistended, soft and  nontender. No organomegaly or masses felt. Normal bowel sounds heard. Central nervous system: Alert and disoriented. No focal neurological deficits. Extremities: Symmetric 5 x 5 power. Skin: No rashes, lesions or ulcers. Psychiatry: Judgement and insight appear severely diminished. Mood & affect unable to determine.   Data Reviewed: I have personally reviewed following labs and imaging studies  CBC: Recent Labs  Lab 04/01/24 0050 04/02/24 0444  WBC 6.9 5.2  NEUTROABS 5.8 4.3  HGB 11.1* 10.7*  HCT 34.3* 33.6*  MCV 106.9* 108.4*  PLT 66* 43*    Basic Metabolic Panel: Recent Labs  Lab 03/27/24 0237 03/27/24 0658 03/31/24 0431 04/01/24 0050 04/01/24 0709 04/01/24 1150 04/02/24 0444  NA 157*   < > 156* 155* 155* 154* 150*  K 4.8   < > 3.5 3.8 3.6 3.3* 3.6  CL 125*   < > 125* 121* 120* 121* 119*  CO2 21*   < > 20* 22 24 22  20*  GLUCOSE 260*   < > 207* 199* 224* 213* 101*  BUN 76*   < > 54* 52* 49* 48* 41*  CREATININE 1.66*   < > 1.30* 1.46* 1.35* 1.28* 1.17  CALCIUM 8.1*   < > 8.0* 8.5* 8.3* 8.0* 7.7*  MG  --   --   --  2.1  --   --  1.8  PHOS 3.7  --   --   --   --   --  2.9   < > = values in this interval not displayed.    CBG: Recent Labs  Lab 04/01/24 1938 04/01/24 2352 04/02/24 0404 04/02/24 0722 04/02/24 1207  GLUCAP 205* 139* 96 99 149*    Recent Results (from the past 240 hours)  Blood Culture (routine x 2)     Status: None   Collection Time: 03/24/24  7:00 AM   Specimen: BLOOD LEFT FOREARM  Result Value Ref Range Status   Specimen Description   Final    BLOOD LEFT FOREARM BOTTLES DRAWN AEROBIC AND ANAEROBIC   Special Requests Blood Culture adequate volume  Final   Culture   Final    NO GROWTH 5 DAYS Performed at [facility], [address], [place], [place] [address]    Report Status 03/29/2024 FINAL  Final  Blood Culture (routine x 2)     Status: None   Collection Time: 03/24/24  7:01 AM   Specimen: BLOOD RIGHT ARM  Result Value Ref Range  Status   Specimen Description   Final    BLOOD RIGHT ARM BOTTLES DRAWN AEROBIC AND ANAEROBIC   Special Requests Blood Culture adequate volume  Final   Culture   Final    NO GROWTH 5 DAYS Performed at [facility], [address], [place], [place] [address]    Report Status 03/29/2024 FINAL  Final  Resp panel by RT-PCR (RSV, Flu A&B, Covid) Anterior Nasal Swab     Status: None   Collection Time: 03/24/24  7:12 AM   Specimen: Anterior Nasal Swab  Result Value Ref Range Status   SARS Coronavirus 2 by RT PCR NEGATIVE NEGATIVE Final    Comment: (NOTE)  SARS-CoV-2 target nucleic acids are NOT DETECTED.  The SARS-CoV-2 RNA is generally detectable in upper respiratory specimens during the acute phase of infection. The lowest concentration of SARS-CoV-2 viral copies this assay can detect is 138 copies/mL. A negative result does not preclude SARS-Cov-2 infection and should not be used as the sole basis for treatment or other patient management decisions. A negative result may occur with  improper specimen collection/handling, submission of specimen other than nasopharyngeal swab, presence of viral mutation(s) within the areas targeted by this assay, and inadequate number of viral copies(<138 copies/mL). A negative result must be combined with clinical observations, patient history, and epidemiological information. The expected result is Negative.  Fact Sheet for Patients:  bloggercourse.com  Fact Sheet for Healthcare Providers:  seriousbroker.it  This test is no t yet approved or cleared by the [place]  FDA and  has been authorized for detection and/or diagnosis of SARS-CoV-2 by FDA under an Emergency Use Authorization (EUA). This EUA will remain  in effect (meaning this test can be used) for the duration of the COVID-19 declaration under Section 564(b)(1) of the Act, 21 U.S.C.section 360bbb-3(b)(1), unless the authorization is  terminated  or revoked sooner.       Influenza A by PCR NEGATIVE NEGATIVE Final   Influenza B by PCR NEGATIVE NEGATIVE Final    Comment: (NOTE) The Xpert Xpress SARS-CoV-2/FLU/RSV plus assay is intended as an aid in the diagnosis of influenza from Nasopharyngeal swab specimens and should not be used as a sole basis for treatment. Nasal washings and aspirates are unacceptable for Xpert Xpress SARS-CoV-2/FLU/RSV testing.  Fact Sheet for Patients: bloggercourse.com  Fact Sheet for Healthcare Providers: seriousbroker.it  This test is not yet approved or cleared by the [place]  FDA and has been authorized for detection and/or diagnosis of SARS-CoV-2 by FDA under an Emergency Use Authorization (EUA). This EUA will remain in effect (meaning this test can be used) for the duration of the COVID-19 declaration under Section 564(b)(1) of the Act, 21 U.S.C. section 360bbb-3(b)(1), unless the authorization is terminated or revoked.     Resp Syncytial Virus by PCR NEGATIVE NEGATIVE Final    Comment: (NOTE) Fact Sheet for Patients: bloggercourse.com  Fact Sheet for Healthcare Providers: seriousbroker.it  This test is not yet approved or cleared by the [place]  FDA and has been authorized for detection and/or diagnosis of SARS-CoV-2 by FDA under an Emergency Use Authorization (EUA). This EUA will remain in effect (meaning this test can be used) for the duration of the COVID-19 declaration under Section 564(b)(1) of the Act, 21 U.S.C. section 360bbb-3(b)(1), unless the authorization is terminated or revoked.  Performed at [facility], [address], [place], [place] [address]   MRSA Next Gen by PCR, Nasal     Status: Abnormal   Collection Time: 03/24/24  8:48 AM   Specimen: Nasal Mucosa; Nasal Swab  Result Value Ref Range Status   MRSA by PCR Next Gen DETECTED (A) NOT  DETECTED Final    Comment: RESULT CALLED TO, READ BACK BY AND VERIFIED WITH: L HYLTON AT 1711 ON [id number] BY S DALTON (NOTE) The GeneXpert MRSA Assay (FDA approved for NASAL specimens only), is one component of a comprehensive MRSA colonization surveillance program. It is not intended to diagnose MRSA infection nor to guide or monitor treatment for MRSA infections. Test performance is not FDA approved in patients less than 55 years old. Performed at [facility], [address] [place], [place] [address]   Blood Culture (routine  x 2)     Status: None (Preliminary result)   Collection Time: 04/01/24 12:50 AM   Specimen: BLOOD RIGHT FOREARM  Result Value Ref Range Status   Specimen Description BLOOD RIGHT FOREARM  Final   Special Requests   Final    BOTTLES DRAWN AEROBIC AND ANAEROBIC Blood Culture adequate volume   Culture   Final    NO GROWTH 1 DAY Performed at [facility], [address] [place], [place] [address]    Report Status PENDING  Incomplete  Blood Culture (routine x 2)     Status: None (Preliminary result)   Collection Time: 04/01/24  1:39 AM   Specimen: Right Antecubital; Blood  Result Value Ref Range Status   Specimen Description RIGHT ANTECUBITAL  Final   Special Requests   Final    BOTTLES DRAWN AEROBIC AND ANAEROBIC Blood Culture adequate volume   Culture   Final    NO GROWTH 1 DAY Performed at [facility], [address], [place], [place] [address]    Report Status PENDING  Incomplete     Radiology Studies: DG Swallowing Func-Speech Pathology Result Date: 04/02/2024 Table formatting from the original result was not included. Modified Barium Swallow Study Patient Details Name: Jay Campbell MRN: [id number] Date of Birth: 08-26-1945 Today's Date: 04/02/2024 HPI/PMH: HPI: ALEKSANDR PELLOW is a 79 y.o. male with medical history significant for Alzheimer's dementia, history of agitation due to dementia, coronary artery disease, permanent atrial fibrillation  previously on Eliquis , complete heart block status post pacemaker placement, chronic thrombocytopenia, type 2 diabetes, hypertension, hyperlipidemia, GERD, BPH, who presents to the ER via EMS from SNF due to shortness of breath and generalized weakness. The patient was discharged yesterday after being admitted for sepsis secondary to pneumonia and acute hypoxic respiratory failure requiring 2 L nasal cannula. Staff at SNF reported the patient was more short of breath and lethargic than baseline today. EMS was activated. He received albuterol  nebs en route via EMS. In the ER, hypoxic with O2 saturation of 87% on 2 L nasal cannula. The patient is alert and confused in the setting of dementia. Frail-appearing with pressure wounds in heels. He is minimally verbal and unable to provide a history. A chest x-ray revealed increasing left basilar infiltrates and small effusion. Prominent cardiac shadow with stable pacemaker. Lactic acid 3.9. There was concern for sepsis. Subsequently, his CT chest abdomen pelvis without contrast revealed left lower lobe and lingular consolidation with patchy bilateral ground glass opacities more consistent with multifocal pneumonia. Compressive atelectasis versus additional pneumonia in the right lower lobe. Mild to moderate bilateral pleural effusions, right greater than left. Pt was seen by SLP service on Thursday and was discharged over the weekend. BSE completed and pt exhibiting overt s/s of aspiration and placed NPO. MBS ordered to determine safest diet and assess swallow function objectively. Clinical Impression: Clinical Impression: Pt presents with mild oropharyngeal dysphagia compounded by cognitive-based dysphagia c/b escape to lateral buccal cavity/floor of mouth and slow, prolonged mastication with solids.  Delayed initiation of tongue motion (oral holding) and delayed initiation of the swallow (typical for age) at the level of the valleculae throughout study.  Diminished  pharyngeal stripping wave and partial epiglottic inflection observed potentially d/t bulging PPW/?cervical osteophytes.  Decreased tongue base retraction resulting in diffuse residue within the vallecular space, pyriform sinuses and posterior pharyngeal wall intermittently during study.  It should be noted pt exhibited xerostomia prior to any po intake d/t NPO status which may have contributed to  amount of residue within pharynx and other structures.  As the study progressed and compensatory strategies instilled including liquid wash, alternating solids/liquids and multiple effortful swallows, residue cleared to mild-insignificant amount within affected structures.  Partial distension/obstruction of flow observed in UES d/t prominent CP bar presence.  NO aspiration and/or penetration observed throughout study, but d/t pt's impaired cognition and deconditioning/dysphagia, he is at risk for aspiration if swallow strategies/A with meals/full supervision is not followed.  Recommend initiating a Dysphagia 1(puree)/thin liquid diet via tsp amounts with FULL supervision/A with meals.  ST will f/u for dysphagia tx/education during remainder of acute stay.  ST should f/u at next venue of care. Factors that may increase risk of adverse event in presence of aspiration Noe & Lianne 2021): Factors that may increase risk of adverse event in presence of aspiration Noe & Lianne 2021): Poor general health and/or compromised immunity; Reduced cognitive function; Frail or deconditioned; Weak cough; Aspiration of thick, dense, and/or acidic materials Recommendations/Plan: Swallowing Evaluation Recommendations Swallowing Evaluation Recommendations Recommendations: PO diet PO Diet Recommendation: Dysphagia 1 (Pureed); Thin liquids (Level 0) Liquid Administration via: Spoon Medication Administration: Crushed with puree Supervision: Full assist for feeding; Full supervision/cueing for swallowing strategies Swallowing strategies   : Slow rate; Small bites/sips; Minimize environmental distractions; effortful swallow; Multiple dry swallows after each bite/sip; Follow solids with liquids Postural changes: Position pt fully upright for meals; Stay upright 30-60 min after meals Oral care recommendations: Oral care BID (2x/day); Staff/trained caregiver to provide oral care Treatment Plan Treatment Plan Treatment recommendations: Therapy as outlined in treatment plan below Follow-up recommendations: Skilled nursing-short term rehab (<3 hours/day) Functional status assessment: Patient has had a recent decline in their functional status and demonstrates the ability to make significant improvements in function in a reasonable and predictable amount of time. Treatment frequency: Min 2x/week Treatment duration: 1 week Interventions: Aspiration precaution training; Compensatory techniques; Patient/family education; Trials of upgraded texture/liquids; Diet toleration management by SLP Recommendations Recommendations for follow up therapy are one component of a multi-disciplinary discharge planning process, led by the attending physician.  Recommendations may be updated based on patient status, additional functional criteria and insurance authorization. Assessment: Orofacial Exam: Orofacial Exam Oral Cavity: Oral Hygiene: Xerostomia Oral Cavity - Dentition: Poor condition; Missing dentition Orofacial Anatomy: WFL Oral Motor/Sensory Function: Generalized oral weakness Anatomy: Anatomy: WFL Boluses Administered: Boluses Administered Boluses Administered: Thin liquids (Level 0); Mildly thick liquids (Level 2, nectar thick); Moderately thick liquids (Level 3, honey thick); Puree; Solid  Oral Impairment Domain: Oral Impairment Domain Lip Closure: No labial escape Tongue control during bolus hold: Escape to lateral buccal cavity/floor of mouth Bolus preparation/mastication: Slow prolonged chewing/mashing with complete recollection Bolus transport/lingual motion:  Delayed initiation of tongue motion (oral holding) Oral residue: Trace residue lining oral structures Location of oral residue : Tongue Initiation of pharyngeal swallow : Valleculae  Pharyngeal Impairment Domain: Pharyngeal Impairment Domain Soft palate elevation: No bolus between soft palate (SP)/pharyngeal wall (PW) Laryngeal elevation: Complete superior movement of thyroid  cartilage with complete approximation of arytenoids to epiglottic petiole Anterior hyoid excursion: Complete anterior movement Epiglottic movement: Partial inversion Laryngeal vestibule closure: Complete, no air/contrast in laryngeal vestibule Pharyngeal stripping wave : Present - diminished Pharyngeal contraction (A/P view only): N/A Pharyngoesophageal segment opening: Partial distention/partial duration, partial obstruction of flow Tongue base retraction: Narrow column of contrast or air between tongue base and PPW Pharyngeal residue: Collection of residue within or on pharyngeal structures Location of pharyngeal residue: Diffuse (>3 areas)  Esophageal Impairment Domain: Esophageal Impairment  Domain Esophageal clearance upright position: Complete clearance, esophageal coating Pill: Pill Consistency administered: -- (n/a) Penetration/Aspiration Scale Score: Penetration/Aspiration Scale Score 1.  Material does not enter airway: Thin liquids (Level 0); Mildly thick liquids (Level 2, nectar thick); Moderately thick liquids (Level 3, honey thick); Puree; Solid Compensatory Strategies: Compensatory Strategies Compensatory strategies: Yes Effortful swallow: Effective Effective Effortful Swallow: Thin liquid (Level 0); Mildly thick liquid (Level 2, nectar thick); Puree Multiple swallows: Effective Effective Multiple Swallows: Thin liquid (Level 0); Mildly thick liquid (Level 2, nectar thick); Puree Liquid wash: Effective Effective Liquid Wash: Thin liquid (Level 0); Mildly thick liquid (Level 2, nectar thick); Puree; Solid   General Information:  Caregiver present: No  Diet Prior to this Study: NPO   Temperature : Normal   Respiratory Status: WFL   Supplemental O2: Nasal cannula (3L)   History of Recent Intubation: No  Behavior/Cognition: Alert; Cooperative; Distractible; Requires cueing Self-Feeding Abilities: Dependent for feeding Baseline vocal quality/speech: Hypophonia/low volume; Dysphonic Volitional Cough: Able to elicit Volitional Swallow: Unable to elicit Exam Limitations: No limitations Goal Planning: Prognosis for improved oropharyngeal function: Good Barriers to Reach Goals: Cognitive deficits No data recorded Patient/Family Stated Goal: n/a Consulted and agree with results and recommendations: Patient; Pt unable/family or caregiver not available; Nurse; Physician Pain: Pain Assessment Pain Assessment: Faces Faces Pain Scale: 2 Breathing: 0 Negative Vocalization: 1 Facial Expression: 1 Body Language: 0 Consolability: 0 PAINAD Score: 0 Pain Location: generalized with movement Pain Descriptors / Indicators: Discomfort; Grimacing Pain Intervention(s): Limited activity within patient's tolerance; Repositioned End of Session: Start Time:SLP Start Time (ACUTE ONLY): 1014 Stop Time: SLP Stop Time (ACUTE ONLY): 1050 Time Calculation:SLP Time Calculation (min) (ACUTE ONLY): 36 min Charges: SLP Evaluations $ SLP Speech Visit: 1 Visit SLP Evaluations $BSS Swallow: 1 Procedure $MBS Swallow: 1 Procedure SLP visit diagnosis: SLP Visit Diagnosis: Dysphagia, oropharyngeal phase (R13.12) Past Medical History: Past Medical History: Diagnosis Date  Alzheimer's dementia (HCC)   Anxiety   Atrial fibrillation (HCC)   Permanent  Coronary artery disease   Mild nonobstructive 1/12  DM (diabetes mellitus) (HCC)   GERD (gastroesophageal reflux disease)   Gout   Hearing disorder, cochlear   Hiatal hernia   HLD (hyperlipidemia)   HTN (hypertension)   S/P AV nodal ablation   s/p SJM PPM; gen change 02-01-13 by Dr Kelsie  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  Warfarin  anticoagulation  Past Surgical History: Past Surgical History: Procedure Laterality Date  CATARACT EXTRACTION Right   COCHLEAR IMPLANT Right   HEMORRHOID BANDING    KNEE ARTHROSCOPY Right 1992  Dr Brenna   PACEMAKER GENERATOR CHANGE Bilateral 02/01/2013  Procedure: PACEMAKER GENERATOR CHANGE;  Surgeon: Lynwood JONETTA Kelsie, MD;  Location: [facility];  Service: Cardiovascular;  Laterality: Bilateral;  PACEMAKER PLACEMENT  2002; 2014  SJM implanted by Dr Waddell with AV nodal ablation performed; gen change to [employer] SR RF by Dr Kelsie 02-01-13  RETINAL LASER PROCEDURE Left   SLT LASER APPLICATION Left 12/14/2015  Procedure: SLT LASER APPLICATION;  Surgeon: Dow JULIANNA Burke, MD;  Location: [facility];  Service: Ophthalmology;  Laterality: Left; Pat Adams,M.S.,CCC-SLP 04/02/2024, 11:23 AM  ECHOCARDIOGRAM COMPLETE Result Date: 04/01/2024    ECHOCARDIOGRAM REPORT   Patient Name:   ZYMERE PATLAN Date of Exam: 04/01/2024 Medical Rec #:  [id number]        Height:       65.0 in Accession #:    [id number]       Weight:       175.7  lb Date of Birth:  [birth year]        BSA:          1.872 m Patient Age:    78 years         BP:           158/69 mmHg Patient Gender: M                HR:           63 bpm. Exam Location:  [facility] Procedure: 2D Echo, Cardiac Doppler and Color Doppler (Both Spectral and Color            Flow Doppler were utilized during procedure). Indications:    CHF-Acute Diastolic I50.31  History:        Patient has prior history of Echocardiogram examinations, most                 recent 05/09/2019. CAD, Pacemaker, Arrythmias:Atrial Fibrillation                 and Complete heart block (HCC); Risk Factors:Hypertension,                 Diabetes, Dyslipidemia and Non-Smoker. Alzheimer's dementia                 (HCC) (From Hx).  Sonographer:    Aida Pizza RCS Referring Phys: [id number] TERRY LOISE HURST  Sonographer Comments: Image acquisition challenging due to patient behavioral factors. IMPRESSIONS  1. Left ventricular  ejection fraction, by estimation, is 60 to 65%. The left ventricle has normal function. The left ventricle has no regional wall motion abnormalities. There is mild left ventricular hypertrophy. Left ventricular diastolic parameters are indeterminate.  2. Right ventricular systolic function is normal. The right ventricular size is mildly enlarged. There is normal pulmonary artery systolic pressure.  3. Left atrial size was severely dilated.  4. Right atrial size was severely dilated.  5. The mitral valve is abnormal. Mild to moderate mitral valve regurgitation. No evidence of mitral stenosis. Severe mitral annular calcification.  6. The aortic valve is tricuspid. There is mild calcification of the aortic valve. There is mild thickening of the aortic valve. Aortic valve regurgitation is not visualized. No aortic stenosis is present. Aortic valve mean gradient measures 6.0 mmHg.  7. Aortic dilatation noted. There is borderline dilatation of the aortic root, measuring 39 mm.  8. The inferior vena cava is normal in size with <50% respiratory variability, suggesting right atrial pressure of 8 mmHg. Comparison(s): No significant change from prior study. FINDINGS  Left Ventricle: Left ventricular ejection fraction, by estimation, is 60 to 65%. The left ventricle has normal function. The left ventricle has no regional wall motion abnormalities. Strain was performed and the global longitudinal strain is indeterminate. The left ventricular internal cavity size was normal in size. There is mild left ventricular hypertrophy. Left ventricular diastolic parameters are indeterminate. Right Ventricle: The right ventricular size is mildly enlarged. No increase in right ventricular wall thickness. Right ventricular systolic function is normal. There is normal pulmonary artery systolic pressure. The tricuspid regurgitant velocity is 2.34  m/s, and with an assumed right atrial pressure of 8 mmHg, the estimated right ventricular systolic  pressure is 29.9 mmHg. Left Atrium: Left atrial size was severely dilated. Right Atrium: Right atrial size was severely dilated. Pericardium: There is no evidence of pericardial effusion. Mitral Valve: The mitral valve is abnormal. Severe mitral annular calcification. Mild to moderate mitral valve regurgitation. No evidence  of mitral valve stenosis. Tricuspid Valve: The tricuspid valve is normal in structure. Tricuspid valve regurgitation is mild . No evidence of tricuspid stenosis. Aortic Valve: The aortic valve is tricuspid. There is mild calcification of the aortic valve. There is mild thickening of the aortic valve. Aortic valve regurgitation is not visualized. No aortic stenosis is present. Aortic valve mean gradient measures 6.0 mmHg. Aortic valve peak gradient measures 11.3 mmHg. Aortic valve area, by VTI measures 1.90 cm. Pulmonic Valve: The pulmonic valve was normal in structure. Pulmonic valve regurgitation is trivial. No evidence of pulmonic stenosis. Aorta: Aortic dilatation noted. There is borderline dilatation of the aortic root, measuring 39 mm. Venous: The inferior vena cava is normal in size with less than 50% respiratory variability, suggesting right atrial pressure of 8 mmHg. IAS/Shunts: No atrial level shunt detected by color flow Doppler. Additional Comments: 3D was performed not requiring image post processing on an independent workstation and was indeterminate.  LEFT VENTRICLE PLAX 2D LVIDd:         4.80 cm LVIDs:         3.00 cm LV PW:         1.20 cm LV IVS:        1.20 cm LVOT diam:     2.10 cm LV SV:         56 LV SV Index:   30 LVOT Area:     3.46 cm  RIGHT VENTRICLE RV S prime:     14.30 cm/s TAPSE (M-mode): 2.7 cm LEFT ATRIUM              Index        RIGHT ATRIUM           Index LA diam:        5.90 cm  3.15 cm/m   RA Area:     38.35 cm LA Vol (A2C):   138.5 ml 73.98 ml/m  RA Volume:   150.00 ml 80.12 ml/m LA Vol (A4C):   182.0 ml 97.21 ml/m LA Biplane Vol: 165.0 ml 88.13 ml/m   AORTIC VALVE AV Area (Vmax):    1.64 cm AV Area (Vmean):   1.83 cm AV Area (VTI):     1.90 cm AV Vmax:           168.00 cm/s AV Vmean:          106.000 cm/s AV VTI:            0.294 m AV Peak Grad:      11.3 mmHg AV Mean Grad:      6.0 mmHg LVOT Vmax:         79.40 cm/s LVOT Vmean:        56.100 cm/s LVOT VTI:          0.161 m LVOT/AV VTI ratio: 0.55  AORTA Ao Root diam: 3.90 cm MITRAL VALVE                TRICUSPID VALVE MV Area (PHT): 3.27 cm     TR Peak grad:   21.9 mmHg MV Decel Time: 232 msec     TR Vmax:        234.00 cm/s MR Peak grad: 119.7 mmHg MR Mean grad: 75.0 mmHg     SHUNTS MR Vmax:      547.00 cm/s   Systemic VTI:  0.16 m MR Vmean:     401.0 cm/s    Systemic Diam: 2.10 cm MV E velocity: 107.00 cm/s MV  A velocity: 33.30 cm/s MV E/A ratio:  3.21 Vishnu Priya Mallipeddi Electronically signed by Diannah Late Mallipeddi Signature Date/Time: 04/01/2024/2:10:59 PM    Final    CT CHEST ABDOMEN PELVIS WO CONTRAST Result Date: 04/01/2024 EXAM: CT CHEST, ABDOMEN AND PELVIS WITHOUT CONTRAST 04/01/2024 01:25:10 AM TECHNIQUE: CT of the chest, abdomen and pelvis was performed without the administration of intravenous contrast. Multiplanar reformatted images are provided for review. Automated exposure control, iterative reconstruction, and/or weight based adjustment of the mA/kV was utilized to reduce the radiation dose to as low as reasonably achievable. COMPARISON: 02/07/2023. CLINICAL HISTORY: Sepsis. FINDINGS: CHEST: MEDIASTINUM AND LYMPH NODES: Cardiomegaly. Pacer wires in the right heart. Coronary artery aortic atherosclerosis. The central airways are clear. No mediastinal, hilar or axillary lymphadenopathy. LUNGS AND PLEURA: Mild to moderate bilateral pleural effusions, right greater than left. Consolidation with air bronchograms in the left lower lobe and posterior lingula concerning for pneumonia. Compressive atelectasis or pneumonia in the right lower lobe. Patchy ground glass airspace opacities also  likely related to pneumonia. No pneumothorax. ABDOMEN AND PELVIS: LIVER: Mild nodular contours of the liver suggest cirrhosis. GALLBLADDER AND BILE DUCTS: High-density material within the gallbladder could reflect small stones or contrast material. No biliary ductal dilatation. SPLEEN: The spleen is mildly enlarged at 13 cm craniocaudal length. PANCREAS: No acute abnormality. ADRENAL GLANDS: No acute abnormality. KIDNEYS, URETERS AND BLADDER: 1.6 cm stone in the upper pole of the right kidney. No hydronephrosis. No perinephric or periureteral stranding. Urinary bladder is unremarkable. GI AND BOWEL: The stomach is decompressed but appears to be thick-walled. This could be related to portal gastropathy or gastritis. Sigmoid diverticulosis. No active diverticulitis. Moderate stool burden throughout the colon. There is no bowel obstruction. REPRODUCTIVE ORGANS: Prostate enlargement. PERITONEUM AND RETROPERITONEUM: Moderate free fluid in the abdomen and pelvis. No free air. VASCULATURE: Aorta is normal in caliber. Aortoiliac catheter sclerosis. ABDOMINAL AND PELVIS LYMPH NODES: No lymphadenopathy. BONES AND SOFT TISSUES: Stable severe compression fracture at L1. Diffuse osteopenia. No focal soft tissue abnormality. IMPRESSION: 1. Left lower lobe and lingular consolidation with patchy bilateral ground-glass opacities, most consistent with multifocal pneumonia. 2. Compressive atelectasis versus additional pneumonia in the right lower lobe. 3. Mild to moderate bilateral pleural effusions, right greater than left. 4. Cirrhotic liver morphology with moderate abdominopelvic ascites and splenomegaly . 5. Gastric wall thickening may be related to portal gastropathy or gastritis . 6. Right upper pole nephrolithiasis . 7. Coronary artery disease, aortic atherosclerosis. 8. Sigmoid diverticulosis. Moderate stool burden in the colon. Electronically signed by: Franky Crease MD 04/01/2024 01:36 AM EST RP Workstation: [id number]   DG  Chest Port 1 View Result Date: 04/01/2024 EXAM: 1 VIEW(S) XRAY OF THE CHEST 04/01/2024 01:00:00 AM COMPARISON: Comparison from 03/24/2024. CLINICAL HISTORY: Increased lethargy and shortness of breath. FINDINGS: LUNGS AND PLEURA: Increasing left basilar infiltrate. Small left pleural effusion. No pneumothorax. HEART AND MEDIASTINUM: The cardiac shadow is prominent. The pacemaker is again seen and stable. BONES AND SOFT TISSUES: No bony abnormality is noted. IMPRESSION: 1. Increasing left basilar infiltrate and small effusion. 2. Prominent cardiac shadow with stable pacemaker. Electronically signed by: Oneil Devonshire MD 04/01/2024 01:04 AM EST RP Workstation: [id number]   Scheduled Meds:  ARIPiprazole   5 mg Oral q morning   enoxaparin  (LOVENOX ) injection  40 mg Subcutaneous Q24H   famotidine   20 mg Oral BID   insulin  aspart  0-15 Units Subcutaneous Q4H   insulin  glargine  15 Units Subcutaneous Daily   memantine   10 mg Oral BID  pantoprazole  (PROTONIX ) IV  40 mg Intravenous Daily   simvastatin   40 mg Oral QPM   tamsulosin   0.4 mg Oral QPM   Continuous Infusions:  dextrose  125 mL/hr at 04/02/24 0859   valproate sodium  250 mg (04/02/24 1131)     LOS: 1 day   Time spent: 55 mins  Korin Setzler Vicci, MD How to contact the [facility] Attending or Consulting provider 7A - 7P or covering provider during after hours 7P -7A, for this patient?  Check the care team in [facility] and look for a) attending/consulting [facility] provider listed and b) the [facility] team listed Log into [email or web address] to find provider on call.  Locate the [facility] provider you are looking for under [facility] and page to a number that you can be directly reached. If you still have difficulty reaching the provider, please page the [facility] (Director on Call) for the Hospitalists listed on amion for assistance.  04/02/2024, 12:38 PM    "

## 2024-04-02 NOTE — Plan of Care (Signed)
   Problem: Activity: Goal: Risk for activity intolerance will decrease Outcome: Progressing   Problem: Nutrition: Goal: Adequate nutrition will be maintained Outcome: Progressing   Problem: Safety: Goal: Ability to remain free from injury will improve Outcome: Progressing   Problem: Skin Integrity: Goal: Risk for impaired skin integrity will decrease Outcome: Progressing

## 2024-04-02 NOTE — Plan of Care (Signed)
  Problem: Education: Goal: Knowledge of General Education information will improve Description: Including pain rating scale, medication(s)/side effects and non-pharmacologic comfort measures Outcome: Not Met (add Reason)   Problem: Health Behavior/Discharge Planning: Goal: Ability to manage health-related needs will improve Outcome: Not Met (add Reason)   Problem: Activity: Goal: Risk for activity intolerance will decrease Outcome: Not Met (add Reason)   Problem: Nutrition: Goal: Adequate nutrition will be maintained Outcome: Not Met (add Reason)

## 2024-04-02 NOTE — Consult Note (Signed)
 Modified Barium Swallow Study  Patient Details  Name: Jay Campbell MRN: [id number] Date of Birth: 05/29/1945  Today's Date: 04/02/2024  Modified Barium Swallow completed.  Full report located under Chart Review in the Imaging Section.  History of Present Illness Jay Campbell is a 78 y.o. male with medical history significant for Alzheimer's dementia, history of agitation due to dementia, coronary artery disease, permanent atrial fibrillation previously on Eliquis , complete heart block status post pacemaker placement, chronic thrombocytopenia, type 2 diabetes, hypertension, hyperlipidemia, GERD, BPH, who presents to the ER via EMS from SNF due to shortness of breath and generalized weakness. The patient was discharged yesterday after being admitted for sepsis secondary to pneumonia and acute hypoxic respiratory failure requiring 2 L nasal cannula. Staff at SNF reported the patient was more short of breath and lethargic than baseline today. EMS was activated. He received albuterol  nebs en route via EMS. In the ER, hypoxic with O2 saturation of 87% on 2 L nasal cannula. The patient is alert and confused in the setting of dementia. Frail-appearing with pressure wounds in heels. He is minimally verbal and unable to provide a history. A chest x-ray revealed increasing left basilar infiltrates and small effusion. Prominent cardiac shadow with stable pacemaker. Lactic acid 3.9. There was concern for sepsis. Subsequently, his CT chest abdomen pelvis without contrast revealed left lower lobe and lingular consolidation with patchy bilateral ground glass opacities more consistent with multifocal pneumonia. Compressive atelectasis versus additional pneumonia in the right lower lobe. Mild to moderate bilateral pleural effusions, right greater than left. Pt was seen by SLP service on Thursday and was discharged over the weekend. BSE completed and pt exhibiting overt s/s of aspiration and placed NPO. MBS ordered to  determine safest diet and assess swallow function objectively.   Clinical Impression Pt presents with mild oropharyngeal dysphagia compounded by cognitive-based dysphagia c/b escape to lateral buccal cavity/floor of mouth and slow, prolonged mastication with solids.  Delayed initiation of tongue motion (oral holding) and delayed initiation of the swallow (typical for age) at the level of the valleculae throughout study.  Diminished pharyngeal stripping wave and partial epiglottic inflection observed potentially d/t bulging PPW/?cervical osteophytes.  Decreased tongue base retraction resulting in diffuse residue within the vallecular space, pyriform sinuses and posterior pharyngeal wall intermittently during study.  It should be noted pt exhibited xerostomia prior to any po intake d/t NPO status which may have contributed to amount of residue within pharynx and other structures.  As the study progressed and compensatory strategies instilled including liquid wash, alternating solids/liquids and multiple effortful swallows, residue cleared to mild-insignificant amount within affected structures.  Partial distension/obstruction of flow observed in UES d/t prominent CP bar presence.  NO aspiration and/or penetration observed throughout study; however, pt is at risk for aspiration d/t impaired cognition and deconditioning without swallow strategies and FULL supervision/A with meals to ensure safety precautions are followed.  Recommend initiating a Dysphagia 1(puree)/thin liquid diet via tsp amounts with FULL supervision/A with meals.  ST will f/u for dysphagia tx/education during remainder of acute stay.  ST should f/u at next venue of care. Factors that may increase risk of adverse event in presence of aspiration Noe & Lianne 2021): Poor general health and/or compromised immunity;Reduced cognitive function;Frail or deconditioned;Weak cough;Aspiration of thick, dense, and/or acidic materials  Swallow  Evaluation Recommendations Recommendations: PO diet PO Diet Recommendation: Dysphagia 1 (Pureed);Thin liquids (Level 0) Liquid Administration via: Spoon Medication Administration: Crushed with puree Supervision: Full assist for feeding;Full supervision/cueing  for swallowing strategies Swallowing strategies  : Slow rate;Small bites/sips;Minimize environmental distractions;effortful swallow;Multiple dry swallows after each bite/sip;Follow solids with liquids Postural changes: Position pt fully upright for meals;Stay upright 30-60 min after meals Oral care recommendations: Oral care BID (2x/day);Staff/trained caregiver to provide oral care      Pat Farris Geiman,M.S.,CCC-SLP 04/02/2024,11:27 AM

## 2024-04-02 NOTE — Consult Note (Signed)
 "                                                                                   Consultation Note Date: 04/02/2024   Patient Name: Jay Campbell  DOB: May 14, 1945  MRN: [id number]  Age / Sex: 79 y.o., male  PCP: Jay Isles, MD Referring Physician: Vicci Afton CROME, MD  Reason for Consultation: Establishing goals of care  HPI/Patient Profile: 79 y.o. male  with past medical history of Alzheimer's dementia, history of agitation due to dementia, coronary artery disease, permanent atrial fibrillation previously on Eliquis , complete heart block status post pacemaker placement, chronic thrombocytopenia, type 2 diabetes, hypertension, hyperlipidemia, GERD, BPH admitted on 04/01/2024 with failure to thrive with dementia and dysphagia.   Clinical Assessment and Goals of Care: I have reviewed medical records including EPIC notes, labs and imaging, received report from RN, assessed the patient.  Jay Campbell is lying quietly in bed.  He appears acutely/chronically ill and frail.  He has known dementia and will briefly make but not keep eye contact.  He does not attempt to answer my orientation questions.  I do not believe that he can make his basic needs known.  There is no family at bedside at this time.  Bedside nursing staff is present attending to needs.  Call to son, Jay Campbell, no answer left voicemail message. Call to daughter, Jay Campbell, to discuss diagnosis prognosis, GOC, EOL wishes, disposition and options. I introduced Palliative Medicine as specialized medical care for people living with serious illness. It focuses on providing relief from the symptoms and stress of a serious illness. The goal is to improve quality of life for both the patient and the family.  We discussed a brief life review of the patient.  Jay Campbell is a widower since 2023.  Jay shares that they have known about his memory loss for 9+ years.  He was a resident of [facility] for 1-1/2 years before moving to  [facility] for the last 1-1/2 years.      We then focused on their current illness.  We talked about memory loss and expected declines.  In particular we talked about speech therapy evaluation, studies, treatment plan recommendations.  We talked about aspiration risk and dysphagia 1 diet.  The natural disease trajectory and expectations at EOL were discussed. I attempted to elicit values and goals of care important to the patient.    Advanced directives, concepts specific to code status, artifical feeding and hydration, and rehospitalization were considered and discussed.  Jay states that both of her parents elected DNR before they got sick.  She tells me that her father would not want a feeding tube. The difference between aggressive medical intervention and comfort care was considered in light of the patient's goals of care.   Hospice and Palliative Care services outpatient were explained and offered.  We talked about returning to [facility] with [facility].  Jay shares her concerns that her father may be alone and or suffering and or die alone if return to [facility].  She asks about [facility].  We talked about [facility], that people go there to let nature  take its course.  Jay states that she believes her father would not want to continue in this current state.  She shares that she will speak with her brother about choices and reach out to the team tomorrow.  Discussed the importance of continued conversation with family and the medical providers regarding overall plan of care and treatment options, ensuring decisions are within the context of the patients values and GOCs.  Questions and concerns were addressed.  The family was encouraged to call with questions or concerns.  PMT will continue to support holistically.  Conference with attending, bedside nursing staff, transition of care team related to patient condition, needs, goals of care,  disposition.   HCPOA  NEXT OF KIN -Jay states that she and her brother Jay Campbell make decisions as a team    SUMMARY OF RECOMMENDATIONS   At this point continue to treat the treatable but no CPR or intubation Time for decision making Considering residential hospice placement at [facility] PMT/TOC to reach out to family 1/21  Code Status/Advance Care Planning: DNR  Symptom Management:  Per hospitalist, no additional needs at this time.  Palliative Prophylaxis:  Frequent Pain Assessment, Oral Care, Palliative Wound Care, and Turn Reposition  Additional Recommendations (Limitations, Scope, Preferences): Continue to treat the treatable but no CPR or intubation, considering comfort care  Psycho-social/Spiritual:  Desire for further Chaplaincy support:no Additional Recommendations: Caregiving  Support/Resources and Education on Hospice  Prognosis:  Unable to determine, guarded.  If family elects comfort and dignity, let nature take its course at residential hospice then 2 weeks or less would be expected.  Best case scenario returning to [facility] under long-term care with hospice, would expect less than 2 months.  Discharge Planning: To Be Determined      Primary Diagnoses: Present on Admission:  Multifocal pneumonia  Dehydration  Pressure injury of skin  Cardiac pacemaker in situ  Complete heart block (HCC)  CORONARY ATHEROSCLEROSIS NATIVE CORONARY ARTERY  Hypernatremia  Protein-calorie malnutrition, severe  Smoldering multiple myeloma  Permanent atrial fibrillation (HCC)  Pancytopenia (HCC)  Iron deficiency anemia  Anxiety  DNR (do not resuscitate)  Failure to thrive in adult   I have reviewed the medical record, interviewed the patient and family, and examined the patient. The following aspects are pertinent.  Past Medical History:  Diagnosis Date   Alzheimer's dementia (HCC)    Anxiety    Atrial fibrillation (HCC)    Permanent   Coronary artery  disease    Mild nonobstructive 1/12   DM (diabetes mellitus) (HCC)    GERD (gastroesophageal reflux disease)    Gout    Hearing disorder, cochlear    Hiatal hernia    HLD (hyperlipidemia)    HTN (hypertension)    S/P AV nodal ablation    s/p SJM PPM; gen change 02-01-13 by Dr Kelsie   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   Warfarin anticoagulation    Social History   Socioeconomic History   Marital status: Married    Spouse name: Not on file   Number of children: Not on file   Years of education: 11   Highest education level: Not on file  Occupational History   Not on file  Tobacco Use   Smoking status: Never   Smokeless tobacco: Never  Vaping Use   Vaping status: Never Used  Substance and Sexual Activity   Alcohol  use: No    Alcohol /week: 0.0 standard drinks of alcohol    Drug use: No   Sexual activity:  Not Currently  Other Topics Concern   Not on file  Social History Narrative   Not on file   Social Drivers of Health   Tobacco Use: Low Risk (04/02/2024)   Patient History    Smoking Tobacco Use: Never    Smokeless Tobacco Use: Never    Passive Exposure: Not on file  Financial Resource Strain: Low Risk (07/01/2023)   Received from [facility]   Overall Financial Resource Strain (CARDIA)    Difficulty of Paying Living Expenses: Not hard at all  Food Insecurity: Patient Unable To Answer (04/01/2024)   Epic    Worried About Programme Researcher, Broadcasting/film/video in the Last Year: Patient unable to answer    Ran Out of Food in the Last Year: Patient unable to answer  Transportation Needs: Patient Unable To Answer (04/01/2024)   Epic    Lack of Transportation (Medical): Patient unable to answer    Lack of Transportation (Non-Medical): Patient unable to answer  Physical Activity: Not on file  Stress: Not on file  Social Connections: Patient Unable To Answer (04/01/2024)   Social Connection and Isolation Panel    Frequency of Communication with Friends and Family: Patient unable  to answer    Frequency of Social Gatherings with Friends and Family: Patient unable to answer    Attends Religious Services: Patient unable to answer    Active Member of Clubs or Organizations: Patient unable to answer    Attends Banker Meetings: Patient unable to answer    Marital Status: Patient unable to answer  Depression (PHQ2-9): Not on file  Alcohol  Screen: Not on file  Housing: Patient Unable To Answer (04/01/2024)   Epic    Unable to Pay for Housing in the Last Year: Patient unable to answer    Number of Times Moved in the Last Year: Not on file    Homeless in the Last Year: Patient unable to answer  Utilities: Patient Unable To Answer (04/01/2024)   Epic    Threatened with loss of utilities: Patient unable to answer  Health Literacy: Not on file   Family History  Problem Relation Age of Onset   COPD Mother    Emphysema Mother    Cancer - Lung Father    Heart disease Paternal Uncle    Stomach cancer Neg Hx    Colon cancer Neg Hx    Scheduled Meds:  ARIPiprazole   5 mg Oral q morning   enoxaparin  (LOVENOX ) injection  40 mg Subcutaneous Q24H   famotidine   20 mg Oral BID   insulin  aspart  0-15 Units Subcutaneous Q4H   insulin  glargine  15 Units Subcutaneous Daily   memantine   10 mg Oral BID   pantoprazole  (PROTONIX ) IV  40 mg Intravenous Daily   simvastatin   40 mg Oral QPM   tamsulosin   0.4 mg Oral QPM   Continuous Infusions:  dextrose  125 mL/hr at 04/02/24 0859   valproate sodium  250 mg (04/01/24 2159)   PRN Meds:.acetaminophen , melatonin, polyethylene glycol, prochlorperazine  Medications Prior to Admission:  Prior to Admission medications  Medication Sig Start Date End Date Taking? Authorizing Provider  acetaminophen  (TYLENOL ) 650 MG CR tablet Take 650 mg by mouth every 8 (eight) hours as needed for pain.    [provider]  allopurinol  (ZYLOPRIM ) 100 MG tablet Take 100 mg by mouth in the morning. 12/15/11   [provider]  Amino  Acids-Protein Hydrolys (FEEDING SUPPLEMENT, PRO-STAT 64,) LIQD Take 30 mLs by mouth in the morning.  [provider]  ARIPiprazole  (ABILIFY ) 5 MG tablet Take 5 mg by mouth every morning. 11/05/23   [provider]  ascorbic acid (VITAMIN C) 500 MG tablet Take 500 mg by mouth 2 (two) times daily.    [provider]  BD INSULIN  SYRINGE U/F 31G X 5/16 0.3 ML MISC  06/26/19   [provider]  Calcium-Vitamins C & D 500-10-250 MG-MG-UNIT CHEW Chew 1 tablet by mouth 2 (two) times daily.    [provider]  cetirizine  (ZYRTEC ) 10 MG tablet Take 10 mg by mouth every morning.    [provider]  Continuous Blood Gluc Sensor (FREESTYLE LIBRE 14 DAY SENSOR) MISC  06/02/21   [provider]  divalproex  (DEPAKOTE  SPRINKLE) 125 MG capsule Take 125 mg by mouth daily in the afternoon. 1400    [provider]  divalproex  (DEPAKOTE  SPRINKLE) 125 MG capsule Take 250 mg by mouth 2 (two) times daily. 0800 and 2000    [provider]  donepezil  (ARICEPT ) 10 MG tablet Take 1 tablet (10 mg total) by mouth at bedtime. 01/13/22   Whitfield Raisin, NP  famotidine  (PEPCID ) 20 MG tablet Take 20 mg by mouth 2 (two) times daily. 07/05/19   [provider]  ferrous sulfate  325 (65 FE) MG EC tablet Take 325 mg by mouth 3 (three) times daily.    [provider]  guaifenesin  (HUMIBID E) 400 MG TABS tablet Take 400 mg by mouth every 12 (twelve) hours.    [provider]  HUMULIN 70/30 (70-30) 100 UNIT/ML injection Inject 30 Units into the skin in the morning. 05/25/21   [provider]  latanoprost  (XALATAN ) 0.005 % ophthalmic solution Place 1 drop into both eyes at bedtime. 02/23/21   [provider]  LORazepam  (ATIVAN ) 0.5 MG tablet Take 1 tablet (0.5 mg total) by mouth 2 (two) times daily. 03/31/24   Ricky Fines, MD  memantine  (NAMENDA ) 10 MG tablet TAKE 1 TABLET BY MOUTH TWICE  DAILY 11/02/21   Whitfield Raisin,  NP  metoprolol  tartrate (LOPRESSOR ) 25 MG tablet Take 25 mg by mouth 2 (two) times daily.    [provider]  Multiple Vitamin (MULTIVITAMIN) tablet Take 1 tablet by mouth in the morning.    [provider]  Nutritional Supplements (BOOST GLUCOSE CONTROL PO) Take 237 mLs by mouth 2 (two) times daily between meals.    [provider]  [facility] VERIO test strip  08/17/17   [provider]  pantoprazole  (PROTONIX ) 40 MG tablet Take 1 tablet (40 mg total) by mouth daily. 04/01/24   Ricky Fines, MD  predniSONE  (DELTASONE ) 20 MG tablet Take 2 tablets by mouth daily x 1 day; then 1 tablet by mouth daily x 2 days; then half tablet by mouth daily x 3 days and stop prednisone . 04/01/24   Ricky Fines, MD  simvastatin  (ZOCOR ) 40 MG tablet Take 40 mg by mouth every evening.    [provider]  tamsulosin  (FLOMAX ) 0.4 MG CAPS capsule Take 0.4 mg by mouth every evening. 09/03/20   [provider]  Water  For Irrigation, Sterile (FREE WATER ) SOLN Take 125 mLs by mouth every 8 (eight) hours. 03/31/24   Ricky Fines, MD  Zinc 220 (50 Zn) MG CAPS Take 1 capsule by mouth every morning.    [provider]   Allergies[1] Review of Systems  Unable to perform ROS: Dementia    Physical Exam Vitals and nursing note reviewed.  Constitutional:  General: He is not in acute distress.    Appearance: He is ill-appearing.  Cardiovascular:     Rate and Rhythm: Normal rate.  Pulmonary:     Effort: Pulmonary effort is normal. No tachypnea.  Skin:    General: Skin is warm and dry.  Neurological:     Mental Status: He is alert.     Comments: Known dementia  Psychiatric:     Comments: Calm and cooperative     Vital Signs: BP (!) 143/90 (BP Location: Left Arm)   Pulse 60   Temp 97.8 F (36.6 C) (Oral)   Resp 16   Ht 5' 5 (1.651 m)   Wt 76 kg   SpO2 95%   BMI 27.88 kg/m  Pain Scale: Faces       SpO2: SpO2: 95 % O2 Device:SpO2: 95 % O2  Flow Rate: .O2 Flow Rate (L/min): 3 L/min  IO: Intake/output summary:  Intake/Output Summary (Last 24 hours) at 04/02/2024 9060 Last data filed at 04/02/2024 9078 Gross per 24 hour  Intake 1889.95 ml  Output 1250 ml  Net 639.95 ml    LBM: Last BM Date :  (UTA) Baseline Weight: Weight: 79.7 kg Most recent weight: Weight: 76 kg     Palliative Assessment/Data:     Time In: 1100  Time Out: 1155 Time Total: 55 minutes  Greater than 50%  of this time was spent counseling and coordinating care related to the above assessment and plan.  Signed by: Lorenza DELENA Birkenhead, NP   Please contact Palliative Medicine Team phone at [phone] for questions and concerns.  For individual provider: See Amion     [1]  Allergies Allergen Reactions   Contrast Media [Iodinated Contrast Media] Other (See Comments)    No reaction listed on MAR   Vioxx [Rofecoxib]     On pt MAR   Shellfish Allergy Itching and Rash   Sulfonamide Derivatives Itching and Rash   "

## 2024-04-03 ENCOUNTER — Inpatient Hospital Stay (HOSPITAL_COMMUNITY)
Admission: RE | Admit: 2024-04-03 | Discharge: 2024-04-18 | DRG: 951 | Disposition: A | Source: Skilled Nursing Facility | Attending: Hospitalist | Admitting: Hospitalist

## 2024-04-03 ENCOUNTER — Encounter: Payer: Self-pay | Admitting: Oncology

## 2024-04-03 DIAGNOSIS — L89616 Pressure-induced deep tissue damage of right heel: Secondary | ICD-10-CM | POA: Diagnosis present

## 2024-04-03 DIAGNOSIS — Z66 Do not resuscitate: Secondary | ICD-10-CM | POA: Diagnosis present

## 2024-04-03 DIAGNOSIS — K219 Gastro-esophageal reflux disease without esophagitis: Secondary | ICD-10-CM | POA: Diagnosis present

## 2024-04-03 DIAGNOSIS — I4821 Permanent atrial fibrillation: Secondary | ICD-10-CM | POA: Diagnosis present

## 2024-04-03 DIAGNOSIS — E872 Acidosis, unspecified: Secondary | ICD-10-CM | POA: Diagnosis present

## 2024-04-03 DIAGNOSIS — R627 Adult failure to thrive: Secondary | ICD-10-CM | POA: Diagnosis present

## 2024-04-03 DIAGNOSIS — Z950 Presence of cardiac pacemaker: Secondary | ICD-10-CM

## 2024-04-03 DIAGNOSIS — E860 Dehydration: Secondary | ICD-10-CM | POA: Diagnosis not present

## 2024-04-03 DIAGNOSIS — J188 Other pneumonia, unspecified organism: Secondary | ICD-10-CM | POA: Diagnosis not present

## 2024-04-03 DIAGNOSIS — R188 Other ascites: Secondary | ICD-10-CM | POA: Diagnosis present

## 2024-04-03 DIAGNOSIS — M109 Gout, unspecified: Secondary | ICD-10-CM | POA: Diagnosis present

## 2024-04-03 DIAGNOSIS — Z888 Allergy status to other drugs, medicaments and biological substances status: Secondary | ICD-10-CM

## 2024-04-03 DIAGNOSIS — A419 Sepsis, unspecified organism: Secondary | ICD-10-CM | POA: Diagnosis present

## 2024-04-03 DIAGNOSIS — Z91013 Allergy to seafood: Secondary | ICD-10-CM

## 2024-04-03 DIAGNOSIS — Z9981 Dependence on supplemental oxygen: Secondary | ICD-10-CM

## 2024-04-03 DIAGNOSIS — N400 Enlarged prostate without lower urinary tract symptoms: Secondary | ICD-10-CM | POA: Diagnosis present

## 2024-04-03 DIAGNOSIS — F419 Anxiety disorder, unspecified: Secondary | ICD-10-CM | POA: Diagnosis present

## 2024-04-03 DIAGNOSIS — Z515 Encounter for palliative care: Secondary | ICD-10-CM | POA: Diagnosis not present

## 2024-04-03 DIAGNOSIS — K5730 Diverticulosis of large intestine without perforation or abscess without bleeding: Secondary | ICD-10-CM | POA: Diagnosis present

## 2024-04-03 DIAGNOSIS — D61818 Other pancytopenia: Secondary | ICD-10-CM

## 2024-04-03 DIAGNOSIS — J9601 Acute respiratory failure with hypoxia: Secondary | ICD-10-CM

## 2024-04-03 DIAGNOSIS — E876 Hypokalemia: Secondary | ICD-10-CM | POA: Diagnosis present

## 2024-04-03 DIAGNOSIS — Z79899 Other long term (current) drug therapy: Secondary | ICD-10-CM

## 2024-04-03 DIAGNOSIS — E870 Hyperosmolality and hypernatremia: Secondary | ICD-10-CM | POA: Diagnosis present

## 2024-04-03 DIAGNOSIS — L89626 Pressure-induced deep tissue damage of left heel: Secondary | ICD-10-CM | POA: Diagnosis present

## 2024-04-03 DIAGNOSIS — F0280 Dementia in other diseases classified elsewhere without behavioral disturbance: Secondary | ICD-10-CM | POA: Diagnosis present

## 2024-04-03 DIAGNOSIS — E1165 Type 2 diabetes mellitus with hyperglycemia: Secondary | ICD-10-CM | POA: Diagnosis present

## 2024-04-03 DIAGNOSIS — Z751 Person awaiting admission to adequate facility elsewhere: Secondary | ICD-10-CM

## 2024-04-03 DIAGNOSIS — E785 Hyperlipidemia, unspecified: Secondary | ICD-10-CM | POA: Diagnosis present

## 2024-04-03 DIAGNOSIS — I442 Atrioventricular block, complete: Secondary | ICD-10-CM | POA: Diagnosis present

## 2024-04-03 DIAGNOSIS — Z825 Family history of asthma and other chronic lower respiratory diseases: Secondary | ICD-10-CM

## 2024-04-03 DIAGNOSIS — Z882 Allergy status to sulfonamides status: Secondary | ICD-10-CM

## 2024-04-03 DIAGNOSIS — R001 Bradycardia, unspecified: Secondary | ICD-10-CM | POA: Diagnosis not present

## 2024-04-03 DIAGNOSIS — Z91041 Radiographic dye allergy status: Secondary | ICD-10-CM

## 2024-04-03 DIAGNOSIS — I110 Hypertensive heart disease with heart failure: Secondary | ICD-10-CM | POA: Diagnosis present

## 2024-04-03 DIAGNOSIS — Z8249 Family history of ischemic heart disease and other diseases of the circulatory system: Secondary | ICD-10-CM

## 2024-04-03 DIAGNOSIS — D696 Thrombocytopenia, unspecified: Secondary | ICD-10-CM | POA: Diagnosis present

## 2024-04-03 DIAGNOSIS — J189 Pneumonia, unspecified organism: Secondary | ICD-10-CM | POA: Diagnosis present

## 2024-04-03 DIAGNOSIS — J9611 Chronic respiratory failure with hypoxia: Secondary | ICD-10-CM | POA: Diagnosis present

## 2024-04-03 DIAGNOSIS — G309 Alzheimer's disease, unspecified: Secondary | ICD-10-CM | POA: Diagnosis present

## 2024-04-03 DIAGNOSIS — I700 Atherosclerosis of aorta: Secondary | ICD-10-CM | POA: Diagnosis present

## 2024-04-03 DIAGNOSIS — R1310 Dysphagia, unspecified: Secondary | ICD-10-CM | POA: Diagnosis present

## 2024-04-03 DIAGNOSIS — K7460 Unspecified cirrhosis of liver: Secondary | ICD-10-CM | POA: Diagnosis present

## 2024-04-03 DIAGNOSIS — I2510 Atherosclerotic heart disease of native coronary artery without angina pectoris: Secondary | ICD-10-CM | POA: Diagnosis present

## 2024-04-03 DIAGNOSIS — I5032 Chronic diastolic (congestive) heart failure: Secondary | ICD-10-CM | POA: Diagnosis present

## 2024-04-03 LAB — GLUCOSE, CAPILLARY
Glucose-Capillary: 106 mg/dL — ABNORMAL HIGH (ref 70–99)
Glucose-Capillary: 108 mg/dL — ABNORMAL HIGH (ref 70–99)
Glucose-Capillary: 134 mg/dL — ABNORMAL HIGH (ref 70–99)
Glucose-Capillary: 97 mg/dL (ref 70–99)

## 2024-04-03 LAB — BASIC METABOLIC PANEL WITH GFR
Anion gap: 9 (ref 5–15)
BUN: 35 mg/dL — ABNORMAL HIGH (ref 8–23)
CO2: 22 mmol/L (ref 22–32)
Calcium: 7.2 mg/dL — ABNORMAL LOW (ref 8.9–10.3)
Chloride: 114 mmol/L — ABNORMAL HIGH (ref 98–111)
Creatinine, Ser: 0.99 mg/dL (ref 0.61–1.24)
GFR, Estimated: >60 mL/min
Glucose, Bld: 119 mg/dL — ABNORMAL HIGH (ref 70–99)
Potassium: 3.3 mmol/L — ABNORMAL LOW (ref 3.5–5.1)
Sodium: 146 mmol/L — ABNORMAL HIGH (ref 135–145)

## 2024-04-03 MED ORDER — GLYCOPYRROLATE 0.2 MG/ML IJ SOLN: 0.2000 mg | INTRAMUSCULAR | Status: DC | PRN

## 2024-04-03 MED ORDER — ONDANSETRON 4 MG PO TBDP: 4.0000 mg | ORAL_TABLET | Freq: Four times a day (QID) | ORAL | Status: DC | PRN

## 2024-04-03 MED ORDER — BIOTENE DRY MOUTH MT LIQD: 15.0000 mL | OROMUCOSAL | Status: DC | PRN

## 2024-04-03 MED ORDER — MORPHINE SULFATE (PF) 2 MG/ML IV SOLN
1.0000 mg | Freq: Once | INTRAVENOUS | Status: AC | PRN
  Administered 2024-04-04: 1 mg via INTRAVENOUS
  Filled 2024-04-03: qty 1

## 2024-04-03 MED ORDER — GLYCOPYRROLATE 0.2 MG/ML IJ SOLN
0.2000 mg | INTRAMUSCULAR | Status: DC | PRN
  Administered 2024-04-03: 0.2 mg via INTRAVENOUS
  Filled 2024-04-03: qty 1

## 2024-04-03 MED ORDER — GLYCOPYRROLATE 1 MG PO TABS: 1.0000 mg | ORAL_TABLET | ORAL | Status: DC | PRN

## 2024-04-03 MED ORDER — ACETAMINOPHEN 650 MG RE SUPP: 650.0000 mg | Freq: Four times a day (QID) | RECTAL | Status: DC | PRN

## 2024-04-03 MED ORDER — LORAZEPAM 1 MG PO TABS: 1.0000 mg | ORAL_TABLET | ORAL | Status: DC | PRN

## 2024-04-03 MED ORDER — HALOPERIDOL LACTATE 5 MG/ML IJ SOLN: 0.5000 mg | INTRAMUSCULAR | Status: DC | PRN

## 2024-04-03 MED ORDER — LORAZEPAM 2 MG/ML PO CONC: 1.0000 mg | ORAL | Status: DC | PRN

## 2024-04-03 MED ORDER — POLYVINYL ALCOHOL 1.4 % OP SOLN: 1.0000 [drp] | Freq: Four times a day (QID) | OPHTHALMIC | Status: DC | PRN

## 2024-04-03 MED ORDER — HALOPERIDOL 0.5 MG PO TABS: 0.5000 mg | ORAL_TABLET | ORAL | Status: DC | PRN

## 2024-04-03 MED ORDER — HALOPERIDOL LACTATE 2 MG/ML PO CONC
0.5000 mg | ORAL | Status: DC | PRN
  Filled 2024-04-03: qty 5

## 2024-04-03 MED ORDER — ACETAMINOPHEN 325 MG PO TABS: 650.0000 mg | ORAL_TABLET | Freq: Four times a day (QID) | ORAL | Status: DC | PRN

## 2024-04-03 MED ORDER — ONDANSETRON HCL 4 MG/2ML IJ SOLN: 4.0000 mg | Freq: Four times a day (QID) | INTRAMUSCULAR | Status: DC | PRN

## 2024-04-03 MED ORDER — LORAZEPAM 2 MG/ML IJ SOLN: 1.0000 mg | INTRAMUSCULAR | Status: DC | PRN

## 2024-04-03 NOTE — Discharge Summary (Signed)
 " Physician Discharge Summary   Patient: Jay Campbell MRN: [id number] DOB: 1945-06-19  Admit date:     04/01/2024  Discharge date: 04/03/24  Discharge Physician: Alm Magalie Almon   PCP: [name], MD   DISCHARGE TO [facility]OSP[facility]/RESIDENT[facility] Hospital Course: 79 y.o. male with medical history significant for Alzheimer's dementia, history of agitation due to dementia, coronary artery disease, permanent atrial fibrillation previously on Eliquis , complete heart block status post pacemaker placement, chronic thrombocytopenia, type 2 diabetes, hypertension, hyperlipidemia, GERD, BPH, who presents to the ER via EMS from SNF due to shortness of breath and generalized weakness.  The patient was discharged yesterday after being admitted for sepsis secondary to pneumonia and acute hypoxic respiratory failure requiring 2 L nasal cannula.  Staff at SNF reported the patient was more short of breath and lethargic than baseline today.  EMS was activated.  He received albuterol  nebs en route via EMS.   In the ER, hypoxic with O2 saturation of 87% on 2 L nasal cannula.  The patient is alert and confused in the setting of dementia.  Frail-appearing with pressure wounds in heels.  He is minimally verbal and unable to provide a history.     A chest x-ray revealed increasing left basilar infiltrates and small effusion.  Prominent cardiac shadow with stable pacemaker.  Lactic acid 3.9.  There was concern for sepsis.  Subsequently, his CT chest abdomen pelvis without contrast revealed left lower lobe and lingular consolidation with patchy bilateral ground glass opacities more consistent with multifocal pneumonia.  Compressive atelectasis versus additional pneumonia in the right lower lobe.  Mild to moderate bilateral pleural effusions, right greater than left.  Cirrhotic liver morphology with moderate abdominal pelvic ascites and splenomegaly.  Gastric wall thickening may be related to portal gastropathy  or gastritis.  Right upper lobe nephrolithiasis.  Coronary artery disease, aortic atherosclerosis.  Sigmoid diverticulosis.  Moderate stool burden in the colon.   The patient received 1 dose of cefepime  and azithromycin  in the ER.  TRH, hospitalist service, was asked to admit for worsening pneumonia.  Subsequently, the patient's antibiotics were discontinued as the patient received a full course of antibiotics during his last hospitalization from 03/24/2024 to 03/31/2024.  He remained afebrile and hemodynamically stable. Unfortunately, the patient's mental status did not improve much.  He was noted to be dehydrated with a free water  deficit.  Sodium went up to 155.  He was started on IV hypotonic fluid.  Although his sodium did gradually improve, the patient remained somnolent with minimal to no p.o. intake.  He continued to have dysphagia.  The patient was seen by speech therapy.  MBS was performed and did not reveal aspiration; however, patient was felt to be at continued risk for aspiration secondary to his impaired cognition and deconditioning.  A dysphagia 1 diet was ultimately recommended.  Palliative medicine was consulted.  After meetings with the patient's family, the patient's son was ultimately agreeable to transition to full comfort measures after the patient was accepted to residential hospice.  Assessment and Plan: Adult Failure to Thrive -- unfortunately, despite maximal medical interventions and recent prolonged hospitalization of 7 days he has bounced back into the hospital within 24 hours. -- Patient has significant dysphagia and unable to eat/drink enough to maintain body requirements for fluids and nutrition -- Admit to medicine consulted -- continue DNR/DNI orders present on admission -Ultimately, patient's family agreed to transition to full comfort measures after the patient was accepted to residential  hospice.   Multifocal pneumonia, seen on CT scan -- Pneumonia was fully treated  during previous hospitalization -- DC antibiotics -- Patient remained afebrile hemodynamically stable -- Aspiration precautions   Severe Dysphagia -- SLP evaluation requested -- MBS done--no definitive aspiration -- Patient continued to remain high risk for aspiration -Dysphagia 1 diet ordered -Oral intake remains very poor   Bilateral mild to moderate pleural effusions, seen on CT scan -- Closely monitor volume status -- Secondary to hepatic hydrothorax -Cirrhotic liver noted on CT abdomen and pelvis   Chronic hypoxic respiratory failure secondary to the above -- Recently started on 2 L O2 supplementation during previous admission for pneumonia. -- Unfortunately supplemental oxygen  wasn't provided at facility -Stable on 2 to 3 L presently.   Lactic acidosis -- Secondary to Hypoxia, hypoperfusion, volume depletion -- Lactic acid is trending down  -- Follow peripheral blood cultures x 2. -- IV fluid hydration  -- Sepsis has been ruled out   Elevated troponin, suspect demand ischemia secondary to the above History of coronary artery disease Aortic atherosclerosis High-sensitivity troponin 23, 26. No evidence of acute ischemia on twelve-lead EKG No chest pain reported. Monitor on telemetry.   Chronic HFpEF Last 2D echo done on 05/09/2019 revealed LVEF 60 to 65% Presented with dyspnea, cardiomegaly, and bilateral pleural effusions -04/01/2024 echo EF 60 to 65%, no WMA, normal RVF   Hypernatremia Speech therapy is consulted to rule out dysphagia Hypotonic IV fluid to correct free water  deficit --Patient now transition to full comfort measures   Gastric wall thickening, may be related to portal gastropathy or gastritis, seen on CT scan. IV PPI Protonix  40 mg daily initially No reported abdominal pain or GI bleed Avoid NSAIDs Patient may transition to full comfort measures   Generalized weakness He is chronically debilitated and this is unchanged Palliative consultation  requested    Type 2 diabetes with hyperglycemia Last hemoglobin A1c 7.3 on 03/24/2024  Hypertension Resume home Lopressor  Closely monitor vital signs.   Alzheimer's dementia with history of agitation Resume home regimen. Aricept  held due to transient bradycardia.   Permanent atrial fibrillation, no longer on Eliquis  due to chronic thrombocytopenia Currently rate controlled       Consultants: palliative medicine Procedures performed: none  Disposition: residential hospice Diet recommendation:  Comfort feeds DISCHARGE MEDICATION: Allergies as of 04/03/2024       Reactions   Contrast Media [iodinated Contrast Media] Other (See Comments)   No reaction listed on MAR   Vioxx [rofecoxib]    On pt MAR   Shellfish Allergy Itching, Rash   Sulfonamide Derivatives Itching, Rash        Medication List     STOP taking these medications    ascorbic acid 500 MG tablet Commonly known as: VITAMIN C   BOOST GLUCOSE CONTROL PO   Calcium-Vitamins C & D 500-10-250 MG-MG-UNIT Chew   feeding supplement (PRO-STAT 64) Liqd   guaifenesin  400 MG Tabs tablet Commonly known as: HUMIBID E   loratadine  10 MG tablet Commonly known as: CLARITIN    LORazepam  0.5 MG tablet Commonly known as: ATIVAN    metoprolol  tartrate 25 MG tablet Commonly known as: LOPRESSOR    multivitamin tablet   Zinc 220 (50 Zn) MG Caps       TAKE these medications    acetaminophen  650 MG CR tablet Commonly known as: TYLENOL  Take 650 mg by mouth every 8 (eight) hours as needed for pain.   allopurinol  100 MG tablet Commonly known as: ZYLOPRIM  Take 100 mg  by mouth in the morning.   ARIPiprazole  5 MG tablet Commonly known as: ABILIFY  Take 5 mg by mouth every morning.   BD Insulin  Syringe U/F 31G X 5/16 0.3 ML Misc Generic drug: Insulin  Syringe-Needle U-100   divalproex  125 MG capsule Commonly known as: DEPAKOTE  SPRINKLE Take 250 mg by mouth 2 (two) times daily. 0800 and 2000 What changed:  Another medication with the same name was removed. Continue taking this medication, and follow the directions you see here.   donepezil  10 MG tablet Commonly known as: ARICEPT  Take 1 tablet (10 mg total) by mouth at bedtime.   famotidine  20 MG tablet Commonly known as: PEPCID  Take 20 mg by mouth 2 (two) times daily.   ferrous sulfate  325 (65 FE) MG EC tablet Take 325 mg by mouth 3 (three) times daily.   free water  Soln Take 125 mLs by mouth every 8 (eight) hours.   FreeStyle Libre 14 Day Sensor Misc   HumuLIN 70/30 (70-30) 100 UNIT/ML injection Generic drug: insulin  NPH-regular Human Inject 30 Units into the skin in the morning.   latanoprost  0.005 % ophthalmic solution Commonly known as: XALATAN  Place 1 drop into both eyes at bedtime.   memantine  10 MG tablet Commonly known as: NAMENDA  TAKE 1 TABLET BY MOUTH TWICE  DAILY   OneTouch Verio test strip Generic drug: glucose blood   pantoprazole  40 MG tablet Commonly known as: PROTONIX  Take 1 tablet (40 mg total) by mouth daily.   simvastatin  40 MG tablet Commonly known as: ZOCOR  Take 40 mg by mouth every evening.   tamsulosin  0.4 MG Caps capsule Commonly known as: FLOMAX  Take 0.4 mg by mouth every evening.        Discharge Exam: Filed Weights   04/01/24 0341 04/02/24 0556 04/03/24 0500  Weight: 79.7 kg 76 kg 81.7 kg   HEENT:  [place]/AT, No thrush, no icterus CV:  RRR, no rub, no S3, no S4 Lung: Bilateral rales.  No wheezing Abd:  soft/+BS, NT Ext:  No edema, no lymphangitis, no synovitis, no rash   Condition at discharge: stable  The results of significant diagnostics from this hospitalization (including imaging, microbiology, ancillary and laboratory) are listed below for reference.   Imaging Studies: DG Swallowing Func-Speech Pathology Result Date: 04/02/2024 Table formatting from the original result was not included. Modified Barium Swallow Study Patient Details Name: Jay Campbell MRN: [id number] Date  of Birth: 17-Jan-1946 Today's Date: 04/02/2024 HPI/PMH: HPI: Jay Campbell is a 79 y.o. male with medical history significant for Alzheimer's dementia, history of agitation due to dementia, coronary artery disease, permanent atrial fibrillation previously on Eliquis , complete heart block status post pacemaker placement, chronic thrombocytopenia, type 2 diabetes, hypertension, hyperlipidemia, GERD, BPH, who presents to the ER via EMS from SNF due to shortness of breath and generalized weakness. The patient was discharged yesterday after being admitted for sepsis secondary to pneumonia and acute hypoxic respiratory failure requiring 2 L nasal cannula. Staff at SNF reported the patient was more short of breath and lethargic than baseline today. EMS was activated. He received albuterol  nebs en route via EMS. In the ER, hypoxic with O2 saturation of 87% on 2 L nasal cannula. The patient is alert and confused in the setting of dementia. Frail-appearing with pressure wounds in heels. He is minimally verbal and unable to provide a history. A chest x-ray revealed increasing left basilar infiltrates and small effusion. Prominent cardiac shadow with stable pacemaker. Lactic acid 3.9. There was concern for sepsis. Subsequently,  his CT chest abdomen pelvis without contrast revealed left lower lobe and lingular consolidation with patchy bilateral ground glass opacities more consistent with multifocal pneumonia. Compressive atelectasis versus additional pneumonia in the right lower lobe. Mild to moderate bilateral pleural effusions, right greater than left. Pt was seen by SLP service on Thursday and was discharged over the weekend. BSE completed and pt exhibiting overt s/s of aspiration and placed NPO. MBS ordered to determine safest diet and assess swallow function objectively. Clinical Impression: Clinical Impression: Pt presents with mild oropharyngeal dysphagia compounded by cognitive-based dysphagia c/b escape to lateral  buccal cavity/floor of mouth and slow, prolonged mastication with solids.  Delayed initiation of tongue motion (oral holding) and delayed initiation of the swallow (typical for age) at the level of the valleculae throughout study.  Diminished pharyngeal stripping wave and partial epiglottic inflection observed potentially d/t bulging PPW/?cervical osteophytes.  Decreased tongue base retraction resulting in diffuse residue within the vallecular space, pyriform sinuses and posterior pharyngeal wall intermittently during study.  It should be noted pt exhibited xerostomia prior to any po intake d/t NPO status which may have contributed to amount of residue within pharynx and other structures.  As the study progressed and compensatory strategies instilled including liquid wash, alternating solids/liquids and multiple effortful swallows, residue cleared to mild-insignificant amount within affected structures.  Partial distension/obstruction of flow observed in UES d/t prominent CP bar presence.  NO aspiration and/or penetration observed throughout study, but d/t pt's impaired cognition and deconditioning/dysphagia, he is at risk for aspiration if swallow strategies/A with meals/full supervision is not followed.  Recommend initiating a Dysphagia 1(puree)/thin liquid diet via tsp amounts with FULL supervision/A with meals.  ST will f/u for dysphagia tx/education during remainder of acute stay.  ST should f/u at next venue of care. Factors that may increase risk of adverse event in presence of aspiration Noe & Lianne 2021): Factors that may increase risk of adverse event in presence of aspiration Noe & Lianne 2021): Poor general health and/or compromised immunity; Reduced cognitive function; Frail or deconditioned; Weak cough; Aspiration of thick, dense, and/or acidic materials Recommendations/Plan: Swallowing Evaluation Recommendations Swallowing Evaluation Recommendations Recommendations: PO diet PO Diet  Recommendation: Dysphagia 1 (Pureed); Thin liquids (Level 0) Liquid Administration via: Spoon Medication Administration: Crushed with puree Supervision: Full assist for feeding; Full supervision/cueing for swallowing strategies Swallowing strategies  : Slow rate; Small bites/sips; Minimize environmental distractions; effortful swallow; Multiple dry swallows after each bite/sip; Follow solids with liquids Postural changes: Position pt fully upright for meals; Stay upright 30-60 min after meals Oral care recommendations: Oral care BID (2x/day); Staff/trained caregiver to provide oral care Treatment Plan Treatment Plan Treatment recommendations: Therapy as outlined in treatment plan below Follow-up recommendations: Skilled nursing-short term rehab (<3 hours/day) Functional status assessment: Patient has had a recent decline in their functional status and demonstrates the ability to make significant improvements in function in a reasonable and predictable amount of time. Treatment frequency: Min 2x/week Treatment duration: 1 week Interventions: Aspiration precaution training; Compensatory techniques; Patient/family education; Trials of upgraded texture/liquids; Diet toleration management by SLP Recommendations Recommendations for follow up therapy are one component of a multi-disciplinary discharge planning process, led by the attending physician.  Recommendations may be updated based on patient status, additional functional criteria and insurance authorization. Assessment: Orofacial Exam: Orofacial Exam Oral Cavity: Oral Hygiene: Xerostomia Oral Cavity - Dentition: Poor condition; Missing dentition Orofacial Anatomy: WFL Oral Motor/Sensory Function: Generalized oral weakness Anatomy: Anatomy: WFL Boluses Administered: Boluses Administered Boluses Administered: Thin  liquids (Level 0); Mildly thick liquids (Level 2, nectar thick); Moderately thick liquids (Level 3, honey thick); Puree; Solid  Oral Impairment Domain: Oral  Impairment Domain Lip Closure: No labial escape Tongue control during bolus hold: Escape to lateral buccal cavity/floor of mouth Bolus preparation/mastication: Slow prolonged chewing/mashing with complete recollection Bolus transport/lingual motion: Delayed initiation of tongue motion (oral holding) Oral residue: Trace residue lining oral structures Location of oral residue : Tongue Initiation of pharyngeal swallow : Valleculae  Pharyngeal Impairment Domain: Pharyngeal Impairment Domain Soft palate elevation: No bolus between soft palate (SP)/pharyngeal wall (PW) Laryngeal elevation: Complete superior movement of thyroid  cartilage with complete approximation of arytenoids to epiglottic petiole Anterior hyoid excursion: Complete anterior movement Epiglottic movement: Partial inversion Laryngeal vestibule closure: Complete, no air/contrast in laryngeal vestibule Pharyngeal stripping wave : Present - diminished Pharyngeal contraction (A/P view only): N/A Pharyngoesophageal segment opening: Partial distention/partial duration, partial obstruction of flow Tongue base retraction: Narrow column of contrast or air between tongue base and PPW Pharyngeal residue: Collection of residue within or on pharyngeal structures Location of pharyngeal residue: Diffuse (>3 areas)  Esophageal Impairment Domain: Esophageal Impairment Domain Esophageal clearance upright position: Complete clearance, esophageal coating Pill: Pill Consistency administered: -- (n/a) Penetration/Aspiration Scale Score: Penetration/Aspiration Scale Score 1.  Material does not enter airway: Thin liquids (Level 0); Mildly thick liquids (Level 2, nectar thick); Moderately thick liquids (Level 3, honey thick); Puree; Solid Compensatory Strategies: Compensatory Strategies Compensatory strategies: Yes Effortful swallow: Effective Effective Effortful Swallow: Thin liquid (Level 0); Mildly thick liquid (Level 2, nectar thick); Puree Multiple swallows: Effective  Effective Multiple Swallows: Thin liquid (Level 0); Mildly thick liquid (Level 2, nectar thick); Puree Liquid wash: Effective Effective Liquid Wash: Thin liquid (Level 0); Mildly thick liquid (Level 2, nectar thick); Puree; Solid   General Information: Caregiver present: No  Diet Prior to this Study: NPO   Temperature : Normal   Respiratory Status: WFL   Supplemental O2: Nasal cannula (3L)   History of Recent Intubation: No  Behavior/Cognition: Alert; Cooperative; Distractible; Requires cueing Self-Feeding Abilities: Dependent for feeding Baseline vocal quality/speech: Hypophonia/low volume; Dysphonic Volitional Cough: Able to elicit Volitional Swallow: Unable to elicit Exam Limitations: No limitations Goal Planning: Prognosis for improved oropharyngeal function: Good Barriers to Reach Goals: Cognitive deficits No data recorded Patient/Family Stated Goal: n/a Consulted and agree with results and recommendations: Patient; Pt unable/family or caregiver not available; Nurse; Physician Pain: Pain Assessment Pain Assessment: Faces Faces Pain Scale: 2 Breathing: 0 Negative Vocalization: 1 Facial Expression: 1 Body Language: 0 Consolability: 0 PAINAD Score: 0 Pain Location: generalized with movement Pain Descriptors / Indicators: Discomfort; Grimacing Pain Intervention(s): Limited activity within patient's tolerance; Repositioned End of Session: Start Time:SLP Start Time (ACUTE ONLY): 1014 Stop Time: SLP Stop Time (ACUTE ONLY): 1050 Time Calculation:SLP Time Calculation (min) (ACUTE ONLY): 36 min Charges: SLP Evaluations $ SLP Speech Visit: 1 Visit SLP Evaluations $BSS Swallow: 1 Procedure $MBS Swallow: 1 Procedure SLP visit diagnosis: SLP Visit Diagnosis: Dysphagia, oropharyngeal phase (R13.12) Past Medical History: Past Medical History: Diagnosis Date  Alzheimer's dementia (HCC)   Anxiety   Atrial fibrillation (HCC)   Permanent  Coronary artery disease   Mild nonobstructive 1/12  DM (diabetes mellitus) (HCC)   GERD  (gastroesophageal reflux disease)   Gout   Hearing disorder, cochlear   Hiatal hernia   HLD (hyperlipidemia)   HTN (hypertension)   S/P AV nodal ablation   s/p SJM PPM; gen change 02-01-13 by Dr Kelsie  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  Warfarin anticoagulation  Past Surgical History: Past Surgical History: Procedure Laterality Date  CATARACT EXTRACTION Right   COCHLEAR IMPLANT Right   HEMORRHOID BANDING    KNEE ARTHROSCOPY Right 1992  Dr Brenna   PACEMAKER GENERATOR CHANGE Bilateral 02/01/2013  Procedure: PACEMAKER GENERATOR CHANGE;  Surgeon: Lynwood JONETTA Rakers, MD;  Location: [facility] CATH LAB;  Service: Cardiovascular;  Laterality: Bilateral;  PACEMAKER PLACEMENT  2002; 2014  SJM implanted by Dr Waddell with AV nodal ablation performed; gen change to [employer] SR RF by Dr Rakers 02-01-13  RETINAL LASER PROCEDURE Left   SLT LASER APPLICATION Left 12/14/2015  Procedure: SLT LASER APPLICATION;  Surgeon: Dow JULIANNA Burke, MD;  Location: [facility];  Service: Ophthalmology;  Laterality: Left; Pat Adams,M.S.,CCC-SLP 04/02/2024, 11:23 AM  ECHOCARDIOGRAM COMPLETE Result Date: 04/01/2024    ECHOCARDIOGRAM REPORT   Patient Name:   Jay Campbell Date of Exam: 04/01/2024 Medical Rec #:  [id number]        Height:       65.0 in Accession #:    [id number]       Weight:       175.7 lb Date of Birth:  1945/11/12        BSA:          1.872 m Patient Age:    78 years         BP:           158/69 mmHg Patient Gender: M                HR:           63 bpm. Exam Location:  [facility] Procedure: 2D Echo, Cardiac Doppler and Color Doppler (Both Spectral and Color            Flow Doppler were utilized during procedure). Indications:    CHF-Acute Diastolic I50.31  History:        Patient has prior history of Echocardiogram examinations, most                 recent 05/09/2019. CAD, Pacemaker, Arrythmias:Atrial Fibrillation                 and Complete heart block (HCC); Risk Factors:Hypertension,                 Diabetes, Dyslipidemia and  Non-Smoker. Alzheimer's dementia                 (HCC) (From Hx).  Sonographer:    Aida Pizza RCS Referring Phys: [id number] TERRY LOISE HURST  Sonographer Comments: Image acquisition challenging due to patient behavioral factors. IMPRESSIONS  1. Left ventricular ejection fraction, by estimation, is 60 to 65%. The left ventricle has normal function. The left ventricle has no regional wall motion abnormalities. There is mild left ventricular hypertrophy. Left ventricular diastolic parameters are indeterminate.  2. Right ventricular systolic function is normal. The right ventricular size is mildly enlarged. There is normal pulmonary artery systolic pressure.  3. Left atrial size was severely dilated.  4. Right atrial size was severely dilated.  5. The mitral valve is abnormal. Mild to moderate mitral valve regurgitation. No evidence of mitral stenosis. Severe mitral annular calcification.  6. The aortic valve is tricuspid. There is mild calcification of the aortic valve. There is mild thickening of the aortic valve. Aortic valve regurgitation is not visualized. No aortic stenosis is present. Aortic valve mean gradient measures 6.0 mmHg.  7. Aortic dilatation noted. There is borderline dilatation of the  aortic root, measuring 39 mm.  8. The inferior vena cava is normal in size with <50% respiratory variability, suggesting right atrial pressure of 8 mmHg. Comparison(s): No significant change from prior study. FINDINGS  Left Ventricle: Left ventricular ejection fraction, by estimation, is 60 to 65%. The left ventricle has normal function. The left ventricle has no regional wall motion abnormalities. Strain was performed and the global longitudinal strain is indeterminate. The left ventricular internal cavity size was normal in size. There is mild left ventricular hypertrophy. Left ventricular diastolic parameters are indeterminate. Right Ventricle: The right ventricular size is mildly enlarged. No increase in right  ventricular wall thickness. Right ventricular systolic function is normal. There is normal pulmonary artery systolic pressure. The tricuspid regurgitant velocity is 2.34  m/s, and with an assumed right atrial pressure of 8 mmHg, the estimated right ventricular systolic pressure is 29.9 mmHg. Left Atrium: Left atrial size was severely dilated. Right Atrium: Right atrial size was severely dilated. Pericardium: There is no evidence of pericardial effusion. Mitral Valve: The mitral valve is abnormal. Severe mitral annular calcification. Mild to moderate mitral valve regurgitation. No evidence of mitral valve stenosis. Tricuspid Valve: The tricuspid valve is normal in structure. Tricuspid valve regurgitation is mild . No evidence of tricuspid stenosis. Aortic Valve: The aortic valve is tricuspid. There is mild calcification of the aortic valve. There is mild thickening of the aortic valve. Aortic valve regurgitation is not visualized. No aortic stenosis is present. Aortic valve mean gradient measures 6.0 mmHg. Aortic valve peak gradient measures 11.3 mmHg. Aortic valve area, by VTI measures 1.90 cm. Pulmonic Valve: The pulmonic valve was normal in structure. Pulmonic valve regurgitation is trivial. No evidence of pulmonic stenosis. Aorta: Aortic dilatation noted. There is borderline dilatation of the aortic root, measuring 39 mm. Venous: The inferior vena cava is normal in size with less than 50% respiratory variability, suggesting right atrial pressure of 8 mmHg. IAS/Shunts: No atrial level shunt detected by color flow Doppler. Additional Comments: 3D was performed not requiring image post processing on an independent workstation and was indeterminate.  LEFT VENTRICLE PLAX 2D LVIDd:         4.80 cm LVIDs:         3.00 cm LV PW:         1.20 cm LV IVS:        1.20 cm LVOT diam:     2.10 cm LV SV:         56 LV SV Index:   30 LVOT Area:     3.46 cm  RIGHT VENTRICLE RV S prime:     14.30 cm/s TAPSE (M-mode): 2.7 cm LEFT  ATRIUM              Index        RIGHT ATRIUM           Index LA diam:        5.90 cm  3.15 cm/m   RA Area:     38.35 cm LA Vol (A2C):   138.5 ml 73.98 ml/m  RA Volume:   150.00 ml 80.12 ml/m LA Vol (A4C):   182.0 ml 97.21 ml/m LA Biplane Vol: 165.0 ml 88.13 ml/m  AORTIC VALVE AV Area (Vmax):    1.64 cm AV Area (Vmean):   1.83 cm AV Area (VTI):     1.90 cm AV Vmax:           168.00 cm/s AV Vmean:  106.000 cm/s AV VTI:            0.294 m AV Peak Grad:      11.3 mmHg AV Mean Grad:      6.0 mmHg LVOT Vmax:         79.40 cm/s LVOT Vmean:        56.100 cm/s LVOT VTI:          0.161 m LVOT/AV VTI ratio: 0.55  AORTA Ao Root diam: 3.90 cm MITRAL VALVE                TRICUSPID VALVE MV Area (PHT): 3.27 cm     TR Peak grad:   21.9 mmHg MV Decel Time: 232 msec     TR Vmax:        234.00 cm/s MR Peak grad: 119.7 mmHg MR Mean grad: 75.0 mmHg     SHUNTS MR Vmax:      547.00 cm/s   Systemic VTI:  0.16 m MR Vmean:     401.0 cm/s    Systemic Diam: 2.10 cm MV E velocity: 107.00 cm/s MV A velocity: 33.30 cm/s MV E/A ratio:  3.21 Vishnu Priya Mallipeddi Electronically signed by Diannah Late Mallipeddi Signature Date/Time: 04/01/2024/2:10:59 PM    Final    CT CHEST ABDOMEN PELVIS WO CONTRAST Result Date: 04/01/2024 EXAM: CT CHEST, ABDOMEN AND PELVIS WITHOUT CONTRAST 04/01/2024 01:25:10 AM TECHNIQUE: CT of the chest, abdomen and pelvis was performed without the administration of intravenous contrast. Multiplanar reformatted images are provided for review. Automated exposure control, iterative reconstruction, and/or weight based adjustment of the mA/kV was utilized to reduce the radiation dose to as low as reasonably achievable. COMPARISON: 02/07/2023. CLINICAL HISTORY: Sepsis. FINDINGS: CHEST: MEDIASTINUM AND LYMPH NODES: Cardiomegaly. Pacer wires in the right heart. Coronary artery aortic atherosclerosis. The central airways are clear. No mediastinal, hilar or axillary lymphadenopathy. LUNGS AND PLEURA: Mild to  moderate bilateral pleural effusions, right greater than left. Consolidation with air bronchograms in the left lower lobe and posterior lingula concerning for pneumonia. Compressive atelectasis or pneumonia in the right lower lobe. Patchy ground glass airspace opacities also likely related to pneumonia. No pneumothorax. ABDOMEN AND PELVIS: LIVER: Mild nodular contours of the liver suggest cirrhosis. GALLBLADDER AND BILE DUCTS: High-density material within the gallbladder could reflect small stones or contrast material. No biliary ductal dilatation. SPLEEN: The spleen is mildly enlarged at 13 cm craniocaudal length. PANCREAS: No acute abnormality. ADRENAL GLANDS: No acute abnormality. KIDNEYS, URETERS AND BLADDER: 1.6 cm stone in the upper pole of the right kidney. No hydronephrosis. No perinephric or periureteral stranding. Urinary bladder is unremarkable. GI AND BOWEL: The stomach is decompressed but appears to be thick-walled. This could be related to portal gastropathy or gastritis. Sigmoid diverticulosis. No active diverticulitis. Moderate stool burden throughout the colon. There is no bowel obstruction. REPRODUCTIVE ORGANS: Prostate enlargement. PERITONEUM AND RETROPERITONEUM: Moderate free fluid in the abdomen and pelvis. No free air. VASCULATURE: Aorta is normal in caliber. Aortoiliac catheter sclerosis. ABDOMINAL AND PELVIS LYMPH NODES: No lymphadenopathy. BONES AND SOFT TISSUES: Stable severe compression fracture at L1. Diffuse osteopenia. No focal soft tissue abnormality. IMPRESSION: 1. Left lower lobe and lingular consolidation with patchy bilateral ground-glass opacities, most consistent with multifocal pneumonia. 2. Compressive atelectasis versus additional pneumonia in the right lower lobe. 3. Mild to moderate bilateral pleural effusions, right greater than left. 4. Cirrhotic liver morphology with moderate abdominopelvic ascites and splenomegaly . 5. Gastric wall thickening may be related to portal  gastropathy or  gastritis . 6. Right upper pole nephrolithiasis . 7. Coronary artery disease, aortic atherosclerosis. 8. Sigmoid diverticulosis. Moderate stool burden in the colon. Electronically signed by: Franky Crease MD 04/01/2024 01:36 AM EST RP Workstation: [id number]   DG Chest Port 1 View Result Date: 04/01/2024 EXAM: 1 VIEW(S) XRAY OF THE CHEST 04/01/2024 01:00:00 AM COMPARISON: Comparison from 03/24/2024. CLINICAL HISTORY: Increased lethargy and shortness of breath. FINDINGS: LUNGS AND PLEURA: Increasing left basilar infiltrate. Small left pleural effusion. No pneumothorax. HEART AND MEDIASTINUM: The cardiac shadow is prominent. The pacemaker is again seen and stable. BONES AND SOFT TISSUES: No bony abnormality is noted. IMPRESSION: 1. Increasing left basilar infiltrate and small effusion. 2. Prominent cardiac shadow with stable pacemaker. Electronically signed by: Oneil Devonshire MD 04/01/2024 01:04 AM EST RP Workstation: [id number]   DG Chest Port 1 View Result Date: 03/24/2024 CLINICAL DATA:  Respiratory distress. EXAM: PORTABLE CHEST 1 VIEW COMPARISON:  03/03/2024 FINDINGS: The cardio pericardial silhouette is enlarged. Retrocardiac atelectasis or infiltrate noted. Right lung clear. Single lead left-sided permanent pacemaker again noted. Telemetry leads overlie the chest. IMPRESSION: Retrocardiac atelectasis or infiltrate. Electronically Signed   By: Camellia Candle M.D.   On: 03/24/2024 07:22    Microbiology: Results for orders placed or performed during the hospital encounter of 04/01/24  Blood Culture (routine x 2)     Status: None (Preliminary result)   Collection Time: 04/01/24 12:50 AM   Specimen: BLOOD RIGHT FOREARM  Result Value Ref Range Status   Specimen Description BLOOD RIGHT FOREARM  Final   Special Requests   Final    BOTTLES DRAWN AEROBIC AND ANAEROBIC Blood Culture adequate volume   Culture   Final    NO GROWTH 2 DAYS Performed at [facility], [address]  [place], [place] [address]    Report Status PENDING  Incomplete  Blood Culture (routine x 2)     Status: None (Preliminary result)   Collection Time: 04/01/24  1:39 AM   Specimen: Right Antecubital; Blood  Result Value Ref Range Status   Specimen Description RIGHT ANTECUBITAL  Final   Special Requests   Final    BOTTLES DRAWN AEROBIC AND ANAEROBIC Blood Culture adequate volume   Culture   Final    NO GROWTH 2 DAYS Performed at [facility], [address], [place], [place] [address]    Report Status PENDING  Incomplete    Labs: CBC: Recent Labs  Lab 04/01/24 0050 04/02/24 0444  WBC 6.9 5.2  NEUTROABS 5.8 4.3  HGB 11.1* 10.7*  HCT 34.3* 33.6*  MCV 106.9* 108.4*  PLT 66* 43*   Basic Metabolic Panel: Recent Labs  Lab 04/01/24 0050 04/01/24 0709 04/01/24 1150 04/02/24 0444 04/03/24 0450  NA 155* 155* 154* 150* 146*  K 3.8 3.6 3.3* 3.6 3.3*  CL 121* 120* 121* 119* 114*  CO2 22 24 22  20* 22  GLUCOSE 199* 224* 213* 101* 119*  BUN 52* 49* 48* 41* 35*  CREATININE 1.46* 1.35* 1.28* 1.17 0.99  CALCIUM 8.5* 8.3* 8.0* 7.7* 7.2*  MG 2.1  --   --  1.8  --   PHOS  --   --   --  2.9  --    Liver Function Tests: Recent Labs  Lab 04/01/24 0050  AST 20  ALT 15  ALKPHOS 101  BILITOT 0.8  PROT 6.6  ALBUMIN 3.0*   CBG: Recent Labs  Lab 04/02/24 2000 04/03/24 0000 04/03/24 0407 04/03/24 0731 04/03/24 1123  GLUCAP 179* 97 108* 134* 106*  Discharge time spent: greater than 30 minutes.  Signed: Alm Schneider, MD [facility] 04/03/2024 "

## 2024-04-03 NOTE — Hospital Course (Signed)
 79 y.o. male with medical history significant for Alzheimer's dementia, history of agitation due to dementia, coronary artery disease, permanent atrial fibrillation previously on Eliquis , complete heart block status post pacemaker placement, chronic thrombocytopenia, type 2 diabetes, hypertension, hyperlipidemia, GERD, BPH, who presents to the ER via EMS from SNF due to shortness of breath and generalized weakness.  The patient was discharged yesterday after being admitted for sepsis secondary to pneumonia and acute hypoxic respiratory failure requiring 2 L nasal cannula.  Staff at SNF reported the patient was more short of breath and lethargic than baseline today.  EMS was activated.  He received albuterol  nebs en route via EMS.   In the ER, hypoxic with O2 saturation of 87% on 2 L nasal cannula.  The patient is alert and confused in the setting of dementia.  Frail-appearing with pressure wounds in heels.  He is minimally verbal and unable to provide a history.     A chest x-ray revealed increasing left basilar infiltrates and small effusion.  Prominent cardiac shadow with stable pacemaker.  Lactic acid 3.9.  There was concern for sepsis.  Subsequently, his CT chest abdomen pelvis without contrast revealed left lower lobe and lingular consolidation with patchy bilateral ground glass opacities more consistent with multifocal pneumonia.  Compressive atelectasis versus additional pneumonia in the right lower lobe.  Mild to moderate bilateral pleural effusions, right greater than left.  Cirrhotic liver morphology with moderate abdominal pelvic ascites and splenomegaly.  Gastric wall thickening may be related to portal gastropathy or gastritis.  Right upper lobe nephrolithiasis.  Coronary artery disease, aortic atherosclerosis.  Sigmoid diverticulosis.  Moderate stool burden in the colon.   The patient received 1 dose of cefepime  and azithromycin  in the ER.  [facility], hospitalist service, was asked to admit for  worsening pneumonia.  Subsequently, the patient's antibiotics were discontinued as the patient received a full course of antibiotics during his last hospitalization from 03/24/2024 to 03/31/2024.  He remained afebrile and hemodynamically stable. Unfortunately, the patient's mental status did not improve much.  He was noted to be dehydrated with a free water  deficit.  Sodium went up to 155.  He was started on IV hypotonic fluid.  Although his sodium did gradually improve, the patient remained somnolent with minimal to no p.o. intake.  He continued to have dysphagia.  The patient was seen by speech therapy.  MBS was performed and did not reveal aspiration; however, patient was felt to be at continued risk for aspiration secondary to his impaired cognition and deconditioning.  A dysphagia 1 diet was ultimately recommended.  Palliative medicine was consulted.  After meetings with the patient's family, the patient's son was ultimately agreeable to transition to full comfort measures after the patient was accepted to residential hospice.  A bed was not available at [facility].  The patient was admitted to the hospital for hospital hospice care until bed is available at [facility] house.  Palliative medicine continue to follow.  They spoke with patient's son and verified desire to transition to full comfort measures.

## 2024-04-03 NOTE — TOC Progression Note (Signed)
 Transition of Care [facility]) - Progression Note    Patient Details  Name: Jay Campbell MRN: [id number] Date of Birth: [birth year]  Transition of Care [facility]) CM/SW Contact  Sharlyne Stabs, RN Phone Number: 04/03/2024, 3:53 PM  Clinical Narrative:   [facility] accepted for residential hospice. Currently they do not have any beds. Consent signed, team updated, Chart can be flipped to GIP.   Expected Discharge Plan: Long Term Acute Care (LTAC) Barriers to Discharge: Continued Medical Work up    Expected Discharge Plan and Services     Post Acute Care Choice: Nursing Home Living arrangements for the past 2 months: Skilled Nursing Facility                     Social Drivers of Health (SDOH) Interventions SDOH Screenings   Food Insecurity: Patient Unable To Answer (04/01/2024)  Housing: Patient Unable To Answer (04/01/2024)  Transportation Needs: Patient Unable To Answer (04/01/2024)  Utilities: Patient Unable To Answer (04/01/2024)  Financial Resource Strain: Low Risk (07/01/2023)   Received from [facility]  Social Connections: Patient Unable To Answer (04/01/2024)  Tobacco Use: Low Risk (04/02/2024)    Readmission Risk Interventions    04/01/2024   11:17 AM 03/31/2024    3:53 PM 03/25/2024    9:58 AM  Readmission Risk Prevention Plan  Transportation Screening Complete Complete Complete  Medication Review Oceanographer) Complete Complete Complete  PCP or Specialist appointment within 3-5 days of discharge Not Complete    HRI or Home Care Consult Complete  Complete  SW Recovery Care/Counseling Consult Complete Complete Complete  Palliative Care Screening Not Complete  Not Applicable  Skilled Nursing Facility Complete Complete Complete

## 2024-04-03 NOTE — Progress Notes (Addendum)
 Palliative:   Mr. Zentner is lying quietly in bed.  He appears acutely/chronically ill and very frail.  His eyes are open, but he does not make eye contact.  He does not attempt to answer my orientation questions.  He clearly cannot make his basic needs known.  No family is at bedside at this time.  Bedside nurses staff is present attending to needs.  Mr. Oleski mouth is extremely dry even after mouth care.  Call to son, Lexander Tremblay.  We talked about Mr. Fentress acute and chronic health concerns and the treatment plan.  We talked about selected labs, IV fluids, weakness and frailty, poor by mouth intake.  Bedside nursing staff this morning stated that Mr. Majid opened his mouth to be fed, but did not chew or swallow food.  I shared that we must eat to live.  We talked about the chronic illness burden related to memory loss and expected progressions.  We talked about residential hospice placement, comfort and dignity at end-of-life.  We talked about stopping painful blood sugar checks or lab draws, instead focusing on comfort.  We talked about prognosis with permission, see below.  Provider choice offered, [place] request referral be made to [facility] house for residential hospice placement.  He states that they would accept comfort measures only when Mr. Pehl is accepted to [place] house.  Conference with attending, bedside nursing staff, transition of care team related to patient condition, needs, goals of care, disposition.  Plan: At this point, continue to treat the treatable but no CPR or intubation.  Referral for [facility] house for comfort and dignity at end-of-life.  If accepted to [facility], comfort measures/end-of-life order set to be implemented. Prognosis: 10 days or less anticipated if family elects comfort care only and residential hospice.  8 weeks even with aggressive care anticipated.  Addendum: Mr. Harman has been accepted to residential hospice at [facility] house.  Unfortunately, they do not  have a bed at this time.  He will be made GIP for hospital hospice care until a bed is available at [facility] house. Call to son, Blade Scheff.  Verified desire for comfort measures only.  Team updated.  Recommend end-of-life order set to be implemented once Mr. Corniel is changed to [facility] hospice.  50 minutes  Torrez Renfroe, NP Palliative medicine team Team phone [phone]

## 2024-04-03 NOTE — TOC Progression Note (Signed)
 Transition of Care [facility]) - Progression Note    Patient Details  Name: Jay Campbell MRN: [id number] Date of Birth: 09/13/1945  Transition of Care [facility]) CM/SW Contact  Sharlyne Stabs, RN Phone Number: 04/03/2024, 12:55 PM  Clinical Narrative:   Referral sent to [facility] for residential hospice. IPCM waiting on ETA for RN assessment.    Expected Discharge Plan: Long Term Acute Care (LTAC) Barriers to Discharge: Continued Medical Work up      Expected Discharge Plan and Services     Post Acute Care Choice: Nursing Home Living arrangements for the past 2 months: Skilled Nursing Facility                        Social Drivers of Health (SDOH) Interventions SDOH Screenings   Food Insecurity: Patient Unable To Answer (04/01/2024)  Housing: Patient Unable To Answer (04/01/2024)  Transportation Needs: Patient Unable To Answer (04/01/2024)  Utilities: Patient Unable To Answer (04/01/2024)  Financial Resource Strain: Low Risk (07/01/2023)   Received from [facility]  Social Connections: Patient Unable To Answer (04/01/2024)  Tobacco Use: Low Risk (04/02/2024)    Readmission Risk Interventions    04/01/2024   11:17 AM 03/31/2024    3:53 PM 03/25/2024    9:58 AM  Readmission Risk Prevention Plan  Transportation Screening Complete Complete Complete  Medication Review Oceanographer) Complete Complete Complete  PCP or Specialist appointment within 3-5 days of discharge Not Complete    HRI or Home Care Consult Complete  Complete  SW Recovery Care/Counseling Consult Complete Complete Complete  Palliative Care Screening Not Complete  Not Applicable  Skilled Nursing Facility Complete Complete Complete

## 2024-04-03 NOTE — Plan of Care (Signed)

## 2024-04-03 NOTE — Plan of Care (Addendum)
" °  Problem: Education: Goal: Knowledge of General Education information will improve Description: Including pain rating scale, medication(s)/side effects and non-pharmacologic comfort measures Outcome: Progressing   Problem: Clinical Measurements: Goal: Diagnostic test results will improve Outcome: Progressing   Problem: Coping: Goal: Ability to identify and develop effective coping behavior will improve Outcome: Progressing   Problem: Role Relationship: Goal: Family's ability to cope with current situation will improve Outcome: Progressing   "

## 2024-04-03 NOTE — H&P (Signed)
 " History and Physical    Patient: Jay Campbell FMW:[id number] DOB: February 27, 1946 DOA: 04/03/2024 DOS: the patient was seen and examined on 04/03/2024 PCP: [name], MD  Patient coming from: SNF  Chief Complaint: End of Life Care HPI: Jay Campbell is a 79 y.o. male with medical history significant for Alzheimer's dementia, history of agitation due to dementia, coronary artery disease, permanent atrial fibrillation previously on Eliquis , complete heart block status post pacemaker placement, chronic thrombocytopenia, type 2 diabetes, hypertension, hyperlipidemia, GERD, BPH, who presents to the ER via EMS from SNF due to shortness of breath and generalized weakness.  The patient was discharged yesterday after being admitted for sepsis secondary to pneumonia and acute hypoxic respiratory failure requiring 2 L nasal cannula.  Staff at SNF reported the patient was more short of breath and lethargic than baseline today.  EMS was activated.  He received albuterol  nebs en route via EMS.   In the ER, hypoxic with O2 saturation of 87% on 2 L nasal cannula.  The patient is alert and confused in the setting of dementia.  Frail-appearing with pressure wounds in heels.  He is minimally verbal and unable to provide a history.     A chest x-ray revealed increasing left basilar infiltrates and small effusion.  Prominent cardiac shadow with stable pacemaker.  Lactic acid 3.9.  There was concern for sepsis.  Subsequently, his CT chest abdomen pelvis without contrast revealed left lower lobe and lingular consolidation with patchy bilateral ground glass opacities more consistent with multifocal pneumonia.  Compressive atelectasis versus additional pneumonia in the right lower lobe.  Mild to moderate bilateral pleural effusions, right greater than left.  Cirrhotic liver morphology with moderate abdominal pelvic ascites and splenomegaly.  Gastric wall thickening may be related to portal gastropathy or gastritis.  Right  upper lobe nephrolithiasis.  Coronary artery disease, aortic atherosclerosis.  Sigmoid diverticulosis.  Moderate stool burden in the colon.   The patient received 1 dose of cefepime  and azithromycin  in the ER.  [facility], hospitalist service, was asked to admit for worsening pneumonia.  Subsequently, the patient's antibiotics were discontinued as the patient received a full course of antibiotics during his last hospitalization from 03/24/2024 to 03/31/2024.  He remained afebrile and hemodynamically stable. Unfortunately, the patient's mental status did not improve much.  He was noted to be dehydrated with a free water  deficit.  Sodium went up to 155.  He was started on IV hypotonic fluid.  Although his sodium did gradually improve, the patient remained somnolent with minimal to no p.o. intake.  He continued to have dysphagia.  The patient was seen by speech therapy.  MBS was performed and did not reveal aspiration; however, patient was felt to be at continued risk for aspiration secondary to his impaired cognition and deconditioning.  A dysphagia 1 diet was ultimately recommended.  Palliative medicine was consulted.  After meetings with the patient's family, the patient's son was ultimately agreeable to transition to full comfort measures after the patient was accepted to residential hospice.  A bed was not available at [facility].  The patient was admitted to the hospital for hospital hospice care until bed is available at [facility] house.  Palliative medicine continue to follow.  They spoke with patient's son and verified desire to transition to full comfort measures.  Review of Systems: As mentioned in the history of present illness. All other systems reviewed and are negative. 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   Warfarin anticoagulation    Past Surgical History:  Procedure Laterality Date   CATARACT EXTRACTION Right    COCHLEAR IMPLANT Right    HEMORRHOID BANDING     KNEE ARTHROSCOPY Right 1992   Dr Brenna    PACEMAKER GENERATOR CHANGE Bilateral 02/01/2013   Procedure: PACEMAKER GENERATOR CHANGE;  Surgeon: Lynwood JONETTA Kelsie, MD;  Location: [facility] CATH LAB;  Service: Cardiovascular;  Laterality: Bilateral;   PACEMAKER PLACEMENT  2002; 2014   SJM implanted by Dr Waddell with AV nodal ablation performed; gen change to [employer] SR RF by Dr Kelsie 02-01-13   RETINAL LASER PROCEDURE Left    SLT LASER APPLICATION Left 12/14/2015   Procedure: SLT LASER APPLICATION;  Surgeon: Dow JULIANNA Burke, MD;  Location: [facility];  Service: Ophthalmology;  Laterality: Left;   Social History:  reports that he has never smoked. He has never used smokeless tobacco. He reports that he does not drink alcohol  and does not use drugs.  Allergies[1]  Family History  Problem Relation Age of Onset   COPD Mother    Emphysema Mother    Cancer - Lung Father    Heart disease Paternal Uncle    Stomach cancer Neg Hx    Colon cancer Neg Hx     Prior to Admission medications  Medication Sig Start Date End Date Taking? Authorizing Provider  acetaminophen  (TYLENOL ) 650 MG CR tablet Take 650 mg by mouth every 8 (eight) hours as needed for pain.    [provider]  allopurinol  (ZYLOPRIM ) 100 MG tablet Take 100 mg by mouth in the morning. 12/15/11   [provider]  ARIPiprazole  (ABILIFY ) 5 MG tablet Take 5 mg by mouth every morning. 11/05/23   [provider]  BD INSULIN  SYRINGE U/F 31G X 5/16 0.3 ML MISC  06/26/19   [provider]  Continuous Blood Gluc Sensor  (FREESTYLE LIBRE 14 DAY SENSOR) MISC  06/02/21   [provider]  divalproex  (DEPAKOTE  SPRINKLE) 125 MG capsule Take 250 mg by mouth 2 (two) times daily. 0800 and 2000    [provider]  donepezil  (ARICEPT ) 10 MG tablet Take 1 tablet (10 mg total) by mouth at bedtime. 01/13/22   Whitfield Raisin, NP  famotidine  (PEPCID ) 20 MG tablet Take 20 mg by mouth 2 (two) times daily. 07/05/19   [provider]  ferrous sulfate  325 (65 FE) MG EC tablet Take 325 mg by mouth 3 (three) times daily.    [provider]  HUMULIN 70/30 (70-30) 100 UNIT/ML injection Inject 30 Units into the skin in the morning. 05/25/21   [provider]  latanoprost  (XALATAN ) 0.005 % ophthalmic solution Place 1 drop into both eyes at bedtime. 02/23/21   [provider]  memantine  (NAMENDA ) 10 MG tablet TAKE 1 TABLET BY MOUTH TWICE  DAILY 11/02/21   Whitfield Raisin, NP  [facility] VERIO test strip  08/17/17   [provider]  pantoprazole  (PROTONIX ) 40 MG tablet Take 1 tablet (40 mg  total) by mouth daily. 04/01/24   Ricky Fines, MD  simvastatin  (ZOCOR ) 40 MG tablet Take 40 mg by mouth every evening.    [provider]  tamsulosin  (FLOMAX ) 0.4 MG CAPS capsule Take 0.4 mg by mouth every evening. 09/03/20   [provider]  Water  For Irrigation, Sterile (FREE WATER ) SOLN Take 125 mLs by mouth every 8 (eight) hours. 03/31/24   Ricky Fines, MD    Physical Exam: GENERAL:  A&O x 1, NAD, well developed, cooperative, does not follow commands HEENT: [place]/AT, No thrush, No icterus, No oral ulcers Neck:  No neck mass, No meningismus, soft, supple CV: RRR, no S3, no S4, no rub, no JVD Lungs: Bibasilar rales but no wheezing. Abd: soft/NT +BS, nondistended Ext: No edema, no lymphangitis, no cyanosis, no rashes   Data Reviewed: Data reviewed above in the history  Assessment and Plan:  Adult Failure to Thrive -- unfortunately, despite maximal medical interventions and  recent prolonged hospitalization of 7 days he has bounced back into the hospital within 24 hours. -- Patient has significant dysphagia and unable to eat/drink enough to maintain body requirements for fluids and nutrition -- Admit to medicine consulted -- continue DNR/DNI orders present on admission -Ultimately, patient's family agreed to transition to full comfort measures after the patient was accepted to residential hospice. - Patient now has been transitioned to general inpatient hospice and for comfort measures have been instituted.   Multifocal pneumonia, seen on CT scan -- Pneumonia was fully treated during previous hospitalization -- DC antibiotics -- Patient remained afebrile hemodynamically stable - Patient now has been transitioned to general inpatient hospice and for comfort measures have been instituted.   Severe Dysphagia -- SLP evaluation requested -- MBS done--no definitive aspiration -- Patient continued to remain high risk for aspiration -Dysphagia 1 diet ordered>> pleasure feeding -Oral intake remains very poor - Patient now has been transitioned to general inpatient hospice and for comfort measures have been instituted.   Bilateral mild to moderate pleural effusions, seen on CT scan -- Closely monitor volume status -- Secondary to hepatic hydrothorax -Cirrhotic liver noted on CT abdomen and pelvis   Chronic hypoxic respiratory failure secondary to the above -- Recently started on 2 L O2 supplementation during previous admission for pneumonia. -- Unfortunately supplemental oxygen  wasn't provided at facility -Stable on 2 to 3 L presently. - Patient now has been transitioned to general inpatient hospice and for comfort measures have been instituted.   Lactic acidosis -- Secondary to Hypoxia, hypoperfusion, volume depletion -- Lactic acid is trending down  -- Follow peripheral blood cultures x 2. -- IV fluid hydration  -- Sepsis has been ruled out   Elevated  troponin, suspect demand ischemia secondary to the above History of coronary artery disease Aortic atherosclerosis High-sensitivity troponin 23, 26. No evidence of acute ischemia on twelve-lead EKG No chest pain reported. Monitor on telemetry.   Chronic HFpEF Last 2D echo done on 05/09/2019 revealed LVEF 60 to 65% Presented with dyspnea, cardiomegaly, and bilateral pleural effusions -04/01/2024 echo EF 60 to 65%, no WMA, normal RVF - Patient now has been transitioned to general inpatient hospice and for comfort measures have been instituted.   Hypernatremia Speech therapy is consulted to rule out dysphagia Hypotonic IV fluid to correct free water  deficit --Patient now transition to full comfort measures   Gastric wall thickening, may be related to portal gastropathy or gastritis, seen on CT scan. IV PPI Protonix  40 mg daily initially No reported abdominal pain  or GI bleed Avoid NSAIDs Patient may transition to full comfort measures   Generalized weakness He is chronically debilitated and this is unchanged Palliative consultation requested    Type 2 diabetes with hyperglycemia Last hemoglobin A1c 7.3 on 03/24/2024   Hypertension Resume home Lopressor  Closely monitor vital signs.   Alzheimer's dementia with history of agitation Resume home regimen. Aricept  held due to transient bradycardia.   Permanent atrial fibrillation, no longer on Eliquis  due to chronic thrombocytopenia Currently rate controlled    Advance Care Planning: FULL COMFORT  Consults: palliative medicine  Family Communication: son  Severity of Illness: The appropriate patient status for this patient is INPATIENT. Inpatient status is judged to be reasonable and necessary in order to provide the required intensity of service to ensure the patient's safety. The patient's presenting symptoms, physical exam findings, and initial radiographic and laboratory data in the context of their chronic comorbidities is  felt to place them at high risk for further clinical deterioration. Furthermore, it is not anticipated that the patient will be medically stable for discharge from the hospital within 2 midnights of admission.   * I certify that at the point of admission it is my clinical judgment that the patient will require inpatient hospital care spanning beyond 2 midnights from the point of admission due to high intensity of service, high risk for further deterioration and high frequency of surveillance required.*  Author: Alm Schneider, MD 04/03/2024 5:40 PM  For on call review [email or web address].     [1]  Allergies Allergen Reactions   Contrast Media [Iodinated Contrast Media] Other (See Comments)    No reaction listed on MAR   Vioxx [Rofecoxib]     On pt MAR   Shellfish Allergy Itching and Rash   Sulfonamide Derivatives Itching and Rash   "

## 2024-04-04 ENCOUNTER — Encounter (HOSPITAL_COMMUNITY): Payer: Self-pay | Admitting: Internal Medicine

## 2024-04-04 DIAGNOSIS — Z515 Encounter for palliative care: Secondary | ICD-10-CM | POA: Diagnosis not present

## 2024-04-04 MED ORDER — MORPHINE SULFATE (PF) 2 MG/ML IV SOLN
1.0000 mg | Freq: Once | INTRAVENOUS | Status: AC | PRN
  Administered 2024-04-04: 1 mg via INTRAVENOUS
  Filled 2024-04-04: qty 1

## 2024-04-04 MED ORDER — MORPHINE SULFATE (CONCENTRATE) 10 MG /0.5 ML PO SOLN
2.6000 mg | ORAL | Status: DC | PRN
  Administered 2024-04-04 – 2024-04-18 (×12): 5 mg via SUBLINGUAL
  Filled 2024-04-04 (×12): qty 0.5

## 2024-04-04 NOTE — Progress Notes (Signed)
 0009: Patient in bed and alert. Patient moaning/groaning with facial grimacing and guarding. Overnight MD Lynwood Kipper reached out to by RN. Order for one time dose of 1mg  IV morphine   ordered and given.   0034: PAINAD score of 0  0412: Patient again in bed with moaning/groaning with facial grimacing, guarding, and slight restlessness. RN messaged Lynwood Kipper MD and order for another dose of 1mg  IV morphine  ordered and given.

## 2024-04-04 NOTE — Plan of Care (Signed)
" °  Problem: Pain Managment: Goal: General experience of comfort will improve and/or be controlled Outcome: Progressing   Problem: Safety: Goal: Ability to remain free from injury will improve Outcome: Progressing   Problem: Skin Integrity: Goal: Risk for impaired skin integrity will decrease Outcome: Progressing   Problem: Education: Goal: Knowledge of the prescribed therapeutic regimen will improve Outcome: Progressing   "

## 2024-04-04 NOTE — Progress Notes (Signed)
 "          PROGRESS NOTE  Jay Campbell FMW:[id number] DOB: 05/08/1945 DOA: 04/03/2024 PCP: [name], MD  Brief History:  79 y.o. male with medical history significant for Alzheimer's dementia, history of agitation due to dementia, coronary artery disease, permanent atrial fibrillation previously on Eliquis , complete heart block status post pacemaker placement, chronic thrombocytopenia, type 2 diabetes, hypertension, hyperlipidemia, GERD, BPH, who presents to the ER via EMS from SNF due to shortness of breath and generalized weakness.  The patient was discharged yesterday after being admitted for sepsis secondary to pneumonia and acute hypoxic respiratory failure requiring 2 L nasal cannula.  Staff at SNF reported the patient was more short of breath and lethargic than baseline today.  EMS was activated.  He received albuterol  nebs en route via EMS.   In the ER, hypoxic with O2 saturation of 87% on 2 L nasal cannula.  The patient is alert and confused in the setting of dementia.  Frail-appearing with pressure wounds in heels.  He is minimally verbal and unable to provide a history.     A chest x-ray revealed increasing left basilar infiltrates and small effusion.  Prominent cardiac shadow with stable pacemaker.  Lactic acid 3.9.  There was concern for sepsis.  Subsequently, his CT chest abdomen pelvis without contrast revealed left lower lobe and lingular consolidation with patchy bilateral ground glass opacities more consistent with multifocal pneumonia.  Compressive atelectasis versus additional pneumonia in the right lower lobe.  Mild to moderate bilateral pleural effusions, right greater than left.  Cirrhotic liver morphology with moderate abdominal pelvic ascites and splenomegaly.  Gastric wall thickening may be related to portal gastropathy or gastritis.  Right upper lobe nephrolithiasis.  Coronary artery disease, aortic atherosclerosis.  Sigmoid diverticulosis.  Moderate stool burden in the  colon.   The patient received 1 dose of cefepime  and azithromycin  in the ER.  TR[facility], hospitalist service, was asked to admit for worsening pneumonia.  Subsequently, the patient's antibiotics were discontinued as the patient received a full course of antibiotics during his last hospitalization from 03/24/2024 to 03/31/2024.  He remained afebrile and hemodynamically stable. Unfortunately, the patient's mental status did not improve much.  He was noted to be dehydrated with a free water  deficit.  Sodium went up to 155.  He was started on IV hypotonic fluid.  Although his sodium did gradually improve, the patient remained somnolent with minimal to no p.o. intake.  He continued to have dysphagia.  The patient was seen by speech therapy.  MBS was performed and did not reveal aspiration; however, patient was felt to be at continued risk for aspiration secondary to his impaired cognition and deconditioning.  A dysphagia 1 diet was ultimately recommended.  Palliative medicine was consulted.  After meetings with the patient's family, the patient's son was ultimately agreeable to transition to full comfort measures after the patient was accepted to residential hospice.  A bed was not available at [facility].  The patient was admitted to the hospital for hospital hospice care until bed is available at [facility] house.  Palliative medicine continue to follow.  They spoke with patient's son and verified desire to transition to full comfort measures.   Assessment/Plan: Adult Failure to Thrive -- unfortunately, despite maximal medical interventions and recent prolonged hospitalization of 7 days he has bounced back into the hospital within 24 hours. -- Patient has significant dysphagia and unable to eat/drink enough to maintain body requirements for fluids and nutrition --  Admit to medicine consulted -- continue DNR/DNI orders present on admission -Ultimately, patient's family agreed to transition to full comfort measures  after the patient was accepted to residential hospice. - Patient now has been transitioned to general inpatient hospice and for comfort measures have been instituted. - added oral morphine  solution   Multifocal pneumonia, seen on CT scan -- Pneumonia was fully treated during previous hospitalization -- DC antibiotics -- Patient remained afebrile hemodynamically stable - Patient now has been transitioned to general inpatient hospice and for comfort measures have been instituted.   Severe Dysphagia -- SLP evaluation requested -- MBS done--no definitive aspiration -- Patient continued to remain high risk for aspiration -Dysphagia 1 diet ordered>> pleasure feeding -Oral intake remains very poor - Patient now has been transitioned to general inpatient hospice and for comfort measures have been instituted.   Bilateral mild to moderate pleural effusions, seen on CT scan -- Closely monitor volume status -- Secondary to hepatic hydrothorax -Cirrhotic liver noted on CT abdomen and pelvis   Chronic hypoxic respiratory failure secondary to the above -- Recently started on 2 L O2 supplementation during previous admission for pneumonia. -- Unfortunately supplemental oxygen  wasn't provided at facility -Stable on 2 to 3 L presently. - Patient now has been transitioned to general inpatient hospice and for comfort measures have been instituted.   Lactic acidosis -- Secondary to Hypoxia, hypoperfusion, volume depletion -- Lactic acid is trending down  -- Follow peripheral blood cultures x 2. -- IV fluid hydration  -- Sepsis has been ruled out   Elevated troponin, suspect demand ischemia secondary to the above History of coronary artery disease Aortic atherosclerosis High-sensitivity troponin 23, 26. No evidence of acute ischemia on twelve-lead EKG No chest pain reported. Monitor on telemetry.   Chronic HFpEF Last 2D echo done on 05/09/2019 revealed LVEF 60 to 65% Presented with dyspnea,  cardiomegaly, and bilateral pleural effusions -04/01/2024 echo EF 60 to 65%, no WMA, normal RVF - Patient now has been transitioned to general inpatient hospice and for comfort measures have been instituted.   Hypernatremia Speech therapy is consulted to rule out dysphagia Hypotonic IV fluid to correct free water  deficit --Patient now transition to full comfort measures  Hypokalemia -initially repleted -now on comfort measures   Gastric wall thickening, may be related to portal gastropathy or gastritis, seen on CT scan. IV PPI Protonix  40 mg daily initially No reported abdominal pain or GI bleed Avoid NSAIDs Patient may transition to full comfort measures   Generalized weakness He is chronically debilitated and this is unchanged Palliative consultation requested    Type 2 diabetes with hyperglycemia Last hemoglobin A1c 7.3 on 03/24/2024   Hypertension Resume home Lopressor  Closely monitor vital signs.   Alzheimer's dementia with history of agitation Resume home regimen. Aricept  held due to transient bradycardia. -now on comfort measures care   Permanent atrial fibrillation, no longer on Eliquis  due to chronic thrombocytopenia Currently rate controlled --- Patient now has been transitioned to general inpatient hospice and for comfort measures have been instituted.       Family Communication:   no Family at bedside  Consultants:  palliative  Code Status:  FULL COMFORT  DVT Prophylaxis:  FULL COMFORT   Procedures: As Listed in Progress Note Above  Antibiotics: None        Subjective: Pt is resting comfortably.  No respiratory distress. No vomiting or diarrhea.  No uncontrolled pain  Objective: Vitals:   04/04/24 0453  BP: (!) 117/44  Pulse:  65  Resp: 20  Temp: 97.9 F (36.6 C)  TempSrc: Axillary  SpO2: 94%   No intake or output data in the 24 hours ending 04/04/24 1827 Weight change:  Exam:  General:  Pt is somnolent, does not follow  commands appropriately, not in acute distress HEENT: No icterus, No thrush, No neck mass, [place]/AT Cardiovascular: RRR, S1/S2, no rubs, no gallops Respiratory: bibasilar rales Abdomen: Soft/+BS, non tender, non distended, no guarding Extremities: No edema, No lymphangitis, No petechiae, No rashes, no synovitis   Data Reviewed: I have personally reviewed following labs and imaging studies Basic Metabolic Panel: Recent Labs  Lab 04/01/24 0050 04/01/24 0709 04/01/24 1150 04/02/24 0444 04/03/24 0450  NA 155* 155* 154* 150* 146*  K 3.8 3.6 3.3* 3.6 3.3*  CL 121* 120* 121* 119* 114*  CO2 22 24 22  20* 22  GLUCOSE 199* 224* 213* 101* 119*  BUN 52* 49* 48* 41* 35*  CREATININE 1.46* 1.35* 1.28* 1.17 0.99  CALCIUM 8.5* 8.3* 8.0* 7.7* 7.2*  MG 2.1  --   --  1.8  --   PHOS  --   --   --  2.9  --    Liver Function Tests: Recent Labs  Lab 04/01/24 0050  AST 20  ALT 15  ALKPHOS 101  BILITOT 0.8  PROT 6.6  ALBUMIN 3.0*   No results for input(s): LIPASE, AMYLASE in the last 168 hours. No results for input(s): AMMONIA in the last 168 hours. Coagulation Profile: No results for input(s): INR, PROTIME in the last 168 hours. CBC: Recent Labs  Lab 04/01/24 0050 04/02/24 0444  WBC 6.9 5.2  NEUTROABS 5.8 4.3  HGB 11.1* 10.7*  HCT 34.3* 33.6*  MCV 106.9* 108.4*  PLT 66* 43*   Cardiac Enzymes: No results for input(s): CKTOTAL, CKMB, CKMBINDEX, TROPONINI in the last 168 hours. BNP: Invalid input(s): POCBNP CBG: Recent Labs  Lab 04/02/24 2000 04/03/24 0000 04/03/24 0407 04/03/24 0731 04/03/24 1123  GLUCAP 179* 97 108* 134* 106*   HbA1C: No results for input(s): HGBA1C in the last 72 hours. Urine analysis:    Component Value Date/Time   COLORURINE COLORLESS (A) 04/01/2024 0658   APPEARANCEUR CLEAR 04/01/2024 0658   LABSPEC 1.008 04/01/2024 0658   PHURINE 5.0 04/01/2024 0658   GLUCOSEU NEGATIVE 04/01/2024 0658   HGBUR MODERATE (A) 04/01/2024 0658    BILIRUBINUR NEGATIVE 04/01/2024 0658   KETONESUR NEGATIVE 04/01/2024 0658   PROTEINUR NEGATIVE 04/01/2024 0658   NITRITE NEGATIVE 04/01/2024 0658   LEUKOCYTESUR NEGATIVE 04/01/2024 0658   Sepsis Labs: @LABRCNTIP (procalcitonin:4,lacticidven:4) ) Recent Results (from the past 240 hours)  Blood Culture (routine x 2)     Status: None (Preliminary result)   Collection Time: 04/01/24 12:50 AM   Specimen: BLOOD RIGHT FOREARM  Result Value Ref Range Status   Specimen Description BLOOD RIGHT FOREARM  Final   Special Requests   Final    BOTTLES DRAWN AEROBIC AND ANAEROBIC Blood Culture adequate volume   Culture   Final    NO GROWTH 3 DAYS Performed at [facility], [address] [place] , [place] [address]    Report Status PENDING  Incomplete  Blood Culture (routine x 2)     Status: None (Preliminary result)   Collection Time: 04/01/24  1:39 AM   Specimen: Right Antecubital; Blood  Result Value Ref Range Status   Specimen Description RIGHT ANTECUBITAL  Final   Special Requests   Final    BOTTLES DRAWN AEROBIC AND ANAEROBIC Blood Culture adequate volume  Culture   Final    NO GROWTH 3 DAYS Performed at [facility], [address], [place], [place] [address]    Report Status PENDING  Incomplete     Scheduled Meds: Continuous Infusions:  Procedures/Studies: DG Swallowing Func-Speech Pathology Result Date: 04/02/2024 Table formatting from the original result was not included. Modified Barium Swallow Study Patient Details Name: CHESTER SIBERT MRN: [id number] Date of Birth: [birth year] Today's Date: 04/02/2024 HPI/PMH: HPI: MILEN LENGACHER is a 79 y.o. male with medical history significant for Alzheimer's dementia, history of agitation due to dementia, coronary artery disease, permanent atrial fibrillation previously on Eliquis , complete heart block status post pacemaker placement, chronic thrombocytopenia, type 2 diabetes, hypertension, hyperlipidemia, GERD, BPH, who presents to the ER via  EMS from SNF due to shortness of breath and generalized weakness. The patient was discharged yesterday after being admitted for sepsis secondary to pneumonia and acute hypoxic respiratory failure requiring 2 L nasal cannula. Staff at SNF reported the patient was more short of breath and lethargic than baseline today. EMS was activated. He received albuterol  nebs en route via EMS. In the ER, hypoxic with O2 saturation of 87% on 2 L nasal cannula. The patient is alert and confused in the setting of dementia. Frail-appearing with pressure wounds in heels. He is minimally verbal and unable to provide a history. A chest x-ray revealed increasing left basilar infiltrates and small effusion. Prominent cardiac shadow with stable pacemaker. Lactic acid 3.9. There was concern for sepsis. Subsequently, his CT chest abdomen pelvis without contrast revealed left lower lobe and lingular consolidation with patchy bilateral ground glass opacities more consistent with multifocal pneumonia. Compressive atelectasis versus additional pneumonia in the right lower lobe. Mild to moderate bilateral pleural effusions, right greater than left. Pt was seen by SLP service on Thursday and was discharged over the weekend. BSE completed and pt exhibiting overt s/s of aspiration and placed NPO. MBS ordered to determine safest diet and assess swallow function objectively. Clinical Impression: Clinical Impression: Pt presents with mild oropharyngeal dysphagia compounded by cognitive-based dysphagia c/b escape to lateral buccal cavity/floor of mouth and slow, prolonged mastication with solids.  Delayed initiation of tongue motion (oral holding) and delayed initiation of the swallow (typical for age) at the level of the valleculae throughout study.  Diminished pharyngeal stripping wave and partial epiglottic inflection observed potentially d/t bulging PPW/?cervical osteophytes.  Decreased tongue base retraction resulting in diffuse residue within the  vallecular space, pyriform sinuses and posterior pharyngeal wall intermittently during study.  It should be noted pt exhibited xerostomia prior to any po intake d/t NPO status which may have contributed to amount of residue within pharynx and other structures.  As the study progressed and compensatory strategies instilled including liquid wash, alternating solids/liquids and multiple effortful swallows, residue cleared to mild-insignificant amount within affected structures.  Partial distension/obstruction of flow observed in UES d/t prominent CP bar presence.  NO aspiration and/or penetration observed throughout study, but d/t pt's impaired cognition and deconditioning/dysphagia, he is at risk for aspiration if swallow strategies/A with meals/full supervision is not followed.  Recommend initiating a Dysphagia 1(puree)/thin liquid diet via tsp amounts with FULL supervision/A with meals.  ST will f/u for dysphagia tx/education during remainder of acute stay.  ST should f/u at next venue of care. Factors that may increase risk of adverse event in presence of aspiration Noe & Lianne 2021): Factors that may increase risk of adverse event in presence of aspiration Noe & Lianne 2021): Poor general  health and/or compromised immunity; Reduced cognitive function; Frail or deconditioned; Weak cough; Aspiration of thick, dense, and/or acidic materials Recommendations/Plan: Swallowing Evaluation Recommendations Swallowing Evaluation Recommendations Recommendations: PO diet PO Diet Recommendation: Dysphagia 1 (Pureed); Thin liquids (Level 0) Liquid Administration via: Spoon Medication Administration: Crushed with puree Supervision: Full assist for feeding; Full supervision/cueing for swallowing strategies Swallowing strategies  : Slow rate; Small bites/sips; Minimize environmental distractions; effortful swallow; Multiple dry swallows after each bite/sip; Follow solids with liquids Postural changes: Position pt fully  upright for meals; Stay upright 30-60 min after meals Oral care recommendations: Oral care BID (2x/day); Staff/trained caregiver to provide oral care Treatment Plan Treatment Plan Treatment recommendations: Therapy as outlined in treatment plan below Follow-up recommendations: Skilled nursing-short term rehab (<3 hours/day) Functional status assessment: Patient has had a recent decline in their functional status and demonstrates the ability to make significant improvements in function in a reasonable and predictable amount of time. Treatment frequency: Min 2x/week Treatment duration: 1 week Interventions: Aspiration precaution training; Compensatory techniques; Patient/family education; Trials of upgraded texture/liquids; Diet toleration management by SLP Recommendations Recommendations for follow up therapy are one component of a multi-disciplinary discharge planning process, led by the attending physician.  Recommendations may be updated based on patient status, additional functional criteria and insurance authorization. Assessment: Orofacial Exam: Orofacial Exam Oral Cavity: Oral Hygiene: Xerostomia Oral Cavity - Dentition: Poor condition; Missing dentition Orofacial Anatomy: WFL Oral Motor/Sensory Function: Generalized oral weakness Anatomy: Anatomy: WFL Boluses Administered: Boluses Administered Boluses Administered: Thin liquids (Level 0); Mildly thick liquids (Level 2, nectar thick); Moderately thick liquids (Level 3, honey thick); Puree; Solid  Oral Impairment Domain: Oral Impairment Domain Lip Closure: No labial escape Tongue control during bolus hold: Escape to lateral buccal cavity/floor of mouth Bolus preparation/mastication: Slow prolonged chewing/mashing with complete recollection Bolus transport/lingual motion: Delayed initiation of tongue motion (oral holding) Oral residue: Trace residue lining oral structures Location of oral residue : Tongue Initiation of pharyngeal swallow : Valleculae  Pharyngeal  Impairment Domain: Pharyngeal Impairment Domain Soft palate elevation: No bolus between soft palate (SP)/pharyngeal wall (PW) Laryngeal elevation: Complete superior movement of thyroid  cartilage with complete approximation of arytenoids to epiglottic petiole Anterior hyoid excursion: Complete anterior movement Epiglottic movement: Partial inversion Laryngeal vestibule closure: Complete, no air/contrast in laryngeal vestibule Pharyngeal stripping wave : Present - diminished Pharyngeal contraction (A/P view only): N/A Pharyngoesophageal segment opening: Partial distention/partial duration, partial obstruction of flow Tongue base retraction: Narrow column of contrast or air between tongue base and PPW Pharyngeal residue: Collection of residue within or on pharyngeal structures Location of pharyngeal residue: Diffuse (>3 areas)  Esophageal Impairment Domain: Esophageal Impairment Domain Esophageal clearance upright position: Complete clearance, esophageal coating Pill: Pill Consistency administered: -- (n/a) Penetration/Aspiration Scale Score: Penetration/Aspiration Scale Score 1.  Material does not enter airway: Thin liquids (Level 0); Mildly thick liquids (Level 2, nectar thick); Moderately thick liquids (Level 3, honey thick); Puree; Solid Compensatory Strategies: Compensatory Strategies Compensatory strategies: Yes Effortful swallow: Effective Effective Effortful Swallow: Thin liquid (Level 0); Mildly thick liquid (Level 2, nectar thick); Puree Multiple swallows: Effective Effective Multiple Swallows: Thin liquid (Level 0); Mildly thick liquid (Level 2, nectar thick); Puree Liquid wash: Effective Effective Liquid Wash: Thin liquid (Level 0); Mildly thick liquid (Level 2, nectar thick); Puree; Solid   General Information: Caregiver present: No  Diet Prior to this Study: NPO   Temperature : Normal   Respiratory Status: WFL   Supplemental O2: Nasal cannula (3L)   History of Recent Intubation: No  Behavior/Cognition:  Alert; Cooperative; Distractible; Requires cueing Self-Feeding Abilities: Dependent for feeding Baseline vocal quality/speech: Hypophonia/low volume; Dysphonic Volitional Cough: Able to elicit Volitional Swallow: Unable to elicit Exam Limitations: No limitations Goal Planning: Prognosis for improved oropharyngeal function: Good Barriers to Reach Goals: Cognitive deficits No data recorded Patient/Family Stated Goal: n/a Consulted and agree with results and recommendations: Patient; Pt unable/family or caregiver not available; Nurse; Physician Pain: Pain Assessment Pain Assessment: Faces Faces Pain Scale: 2 Breathing: 0 Negative Vocalization: 1 Facial Expression: 1 Body Language: 0 Consolability: 0 PAINAD Score: 0 Pain Location: generalized with movement Pain Descriptors / Indicators: Discomfort; Grimacing Pain Intervention(s): Limited activity within patient's tolerance; Repositioned End of Session: Start Time:SLP Start Time (ACUTE ONLY): 1014 Stop Time: SLP Stop Time (ACUTE ONLY): 1050 Time Calculation:SLP Time Calculation (min) (ACUTE ONLY): 36 min Charges: SLP Evaluations $ SLP Speech Visit: 1 Visit SLP Evaluations $BSS Swallow: 1 Procedure $MBS Swallow: 1 Procedure SLP visit diagnosis: SLP Visit Diagnosis: Dysphagia, oropharyngeal phase (R13.12) Past Medical History: Past Medical History: Diagnosis Date  Alzheimer's dementia (HCC)   Anxiety   Atrial fibrillation (HCC)   Permanent  Coronary artery disease   Mild nonobstructive 1/12  DM (diabetes mellitus) (HCC)   GERD (gastroesophageal reflux disease)   Gout   Hearing disorder, cochlear   Hiatal hernia   HLD (hyperlipidemia)   HTN (hypertension)   S/P AV nodal ablation   s/p SJM PPM; gen change 02-01-13 by Dr Kelsie  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  Warfarin anticoagulation  Past Surgical History: Past Surgical History: Procedure Laterality Date  CATARACT EXTRACTION Right   COCHLEAR IMPLANT Right   HEMORRHOID BANDING    KNEE ARTHROSCOPY Right 1992  Dr  Brenna   PACEMAKER GENERATOR CHANGE Bilateral 02/01/2013  Procedure: PACEMAKER GENERATOR CHANGE;  Surgeon: Lynwood JONETTA Kelsie, MD;  Location: [facility] CATH LAB;  Service: Cardiovascular;  Laterality: Bilateral;  PACEMAKER PLACEMENT  2002; 2014  SJM implanted by Dr Waddell with AV nodal ablation performed; gen change to [employer] SR RF by Dr Kelsie 02-01-13  RETINAL LASER PROCEDURE Left   SLT LASER APPLICATION Left 12/14/2015  Procedure: SLT LASER APPLICATION;  Surgeon: Dow JULIANNA Burke, MD;  Location: [facility];  Service: Ophthalmology;  Laterality: Left; Pat Adams,M.S.,CCC-SLP 04/02/2024, 11:23 AM  ECHOCARDIOGRAM COMPLETE Result Date: 04/01/2024    ECHOCARDIOGRAM REPORT   Patient Name:   Jay Campbell Date of Exam: 04/01/2024 Medical Rec #:  [id number]        Height:       65.0 in Accession #:    [id number]       Weight:       175.7 lb Date of Birth:  [birth year]        BSA:          1.872 m Patient Age:    78 years         BP:           158/69 mmHg Patient Gender: M                HR:           63 bpm. Exam Location:  [facility] Procedure: 2D Echo, Cardiac Doppler and Color Doppler (Both Spectral and Color            Flow Doppler were utilized during procedure). Indications:    CHF-Acute Diastolic I50.31  History:        Patient has prior history of Echocardiogram examinations, most  recent 05/09/2019. CAD, Pacemaker, Arrythmias:Atrial Fibrillation                 and Complete heart block (HCC); Risk Factors:Hypertension,                 Diabetes, Dyslipidemia and Non-Smoker. Alzheimer's dementia                 (HCC) (From Hx).  Sonographer:    Aida Pizza RCS Referring Phys: [id number] TERRY LOISE HURST  Sonographer Comments: Image acquisition challenging due to patient behavioral factors. IMPRESSIONS  1. Left ventricular ejection fraction, by estimation, is 60 to 65%. The left ventricle has normal function. The left ventricle has no regional wall motion abnormalities. There is mild left ventricular hypertrophy. Left  ventricular diastolic parameters are indeterminate.  2. Right ventricular systolic function is normal. The right ventricular size is mildly enlarged. There is normal pulmonary artery systolic pressure.  3. Left atrial size was severely dilated.  4. Right atrial size was severely dilated.  5. The mitral valve is abnormal. Mild to moderate mitral valve regurgitation. No evidence of mitral stenosis. Severe mitral annular calcification.  6. The aortic valve is tricuspid. There is mild calcification of the aortic valve. There is mild thickening of the aortic valve. Aortic valve regurgitation is not visualized. No aortic stenosis is present. Aortic valve mean gradient measures 6.0 mmHg.  7. Aortic dilatation noted. There is borderline dilatation of the aortic root, measuring 39 mm.  8. The inferior vena cava is normal in size with <50% respiratory variability, suggesting right atrial pressure of 8 mmHg. Comparison(s): No significant change from prior study. FINDINGS  Left Ventricle: Left ventricular ejection fraction, by estimation, is 60 to 65%. The left ventricle has normal function. The left ventricle has no regional wall motion abnormalities. Strain was performed and the global longitudinal strain is indeterminate. The left ventricular internal cavity size was normal in size. There is mild left ventricular hypertrophy. Left ventricular diastolic parameters are indeterminate. Right Ventricle: The right ventricular size is mildly enlarged. No increase in right ventricular wall thickness. Right ventricular systolic function is normal. There is normal pulmonary artery systolic pressure. The tricuspid regurgitant velocity is 2.34  m/s, and with an assumed right atrial pressure of 8 mmHg, the estimated right ventricular systolic pressure is 29.9 mmHg. Left Atrium: Left atrial size was severely dilated. Right Atrium: Right atrial size was severely dilated. Pericardium: There is no evidence of pericardial effusion. Mitral  Valve: The mitral valve is abnormal. Severe mitral annular calcification. Mild to moderate mitral valve regurgitation. No evidence of mitral valve stenosis. Tricuspid Valve: The tricuspid valve is normal in structure. Tricuspid valve regurgitation is mild . No evidence of tricuspid stenosis. Aortic Valve: The aortic valve is tricuspid. There is mild calcification of the aortic valve. There is mild thickening of the aortic valve. Aortic valve regurgitation is not visualized. No aortic stenosis is present. Aortic valve mean gradient measures 6.0 mmHg. Aortic valve peak gradient measures 11.3 mmHg. Aortic valve area, by VTI measures 1.90 cm. Pulmonic Valve: The pulmonic valve was normal in structure. Pulmonic valve regurgitation is trivial. No evidence of pulmonic stenosis. Aorta: Aortic dilatation noted. There is borderline dilatation of the aortic root, measuring 39 mm. Venous: The inferior vena cava is normal in size with less than 50% respiratory variability, suggesting right atrial pressure of 8 mmHg. IAS/Shunts: No atrial level shunt detected by color flow Doppler. Additional Comments: 3D was performed not requiring image post processing on an  independent workstation and was indeterminate.  LEFT VENTRICLE PLAX 2D LVIDd:         4.80 cm LVIDs:         3.00 cm LV PW:         1.20 cm LV IVS:        1.20 cm LVOT diam:     2.10 cm LV SV:         56 LV SV Index:   30 LVOT Area:     3.46 cm  RIGHT VENTRICLE RV S prime:     14.30 cm/s TAPSE (M-mode): 2.7 cm LEFT ATRIUM              Index        RIGHT ATRIUM           Index LA diam:        5.90 cm  3.15 cm/m   RA Area:     38.35 cm LA Vol (A2C):   138.5 ml 73.98 ml/m  RA Volume:   150.00 ml 80.12 ml/m LA Vol (A4C):   182.0 ml 97.21 ml/m LA Biplane Vol: 165.0 ml 88.13 ml/m  AORTIC VALVE AV Area (Vmax):    1.64 cm AV Area (Vmean):   1.83 cm AV Area (VTI):     1.90 cm AV Vmax:           168.00 cm/s AV Vmean:          106.000 cm/s AV VTI:            0.294 m AV Peak  Grad:      11.3 mmHg AV Mean Grad:      6.0 mmHg LVOT Vmax:         79.40 cm/s LVOT Vmean:        56.100 cm/s LVOT VTI:          0.161 m LVOT/AV VTI ratio: 0.55  AORTA Ao Root diam: 3.90 cm MITRAL VALVE                TRICUSPID VALVE MV Area (PHT): 3.27 cm     TR Peak grad:   21.9 mmHg MV Decel Time: 232 msec     TR Vmax:        234.00 cm/s MR Peak grad: 119.7 mmHg MR Mean grad: 75.0 mmHg     SHUNTS MR Vmax:      547.00 cm/s   Systemic VTI:  0.16 m MR Vmean:     401.0 cm/s    Systemic Diam: 2.10 cm MV E velocity: 107.00 cm/s MV A velocity: 33.30 cm/s MV E/A ratio:  3.21 Vishnu Priya Mallipeddi Electronically signed by Diannah Late Mallipeddi Signature Date/Time: 04/01/2024/2:10:59 PM    Final    CT CHEST ABDOMEN PELVIS WO CONTRAST Result Date: 04/01/2024 EXAM: CT CHEST, ABDOMEN AND PELVIS WITHOUT CONTRAST 04/01/2024 01:25:10 AM TECHNIQUE: CT of the chest, abdomen and pelvis was performed without the administration of intravenous contrast. Multiplanar reformatted images are provided for review. Automated exposure control, iterative reconstruction, and/or weight based adjustment of the mA/kV was utilized to reduce the radiation dose to as low as reasonably achievable. COMPARISON: 02/07/2023. CLINICAL HISTORY: Sepsis. FINDINGS: CHEST: MEDIASTINUM AND LYMPH NODES: Cardiomegaly. Pacer wires in the right heart. Coronary artery aortic atherosclerosis. The central airways are clear. No mediastinal, hilar or axillary lymphadenopathy. LUNGS AND PLEURA: Mild to moderate bilateral pleural effusions, right greater than left. Consolidation with air bronchograms in the left lower lobe and posterior lingula concerning for pneumonia.  Compressive atelectasis or pneumonia in the right lower lobe. Patchy ground glass airspace opacities also likely related to pneumonia. No pneumothorax. ABDOMEN AND PELVIS: LIVER: Mild nodular contours of the liver suggest cirrhosis. GALLBLADDER AND BILE DUCTS: High-density material within the  gallbladder could reflect small stones or contrast material. No biliary ductal dilatation. SPLEEN: The spleen is mildly enlarged at 13 cm craniocaudal length. PANCREAS: No acute abnormality. ADRENAL GLANDS: No acute abnormality. KIDNEYS, URETERS AND BLADDER: 1.6 cm stone in the upper pole of the right kidney. No hydronephrosis. No perinephric or periureteral stranding. Urinary bladder is unremarkable. GI AND BOWEL: The stomach is decompressed but appears to be thick-walled. This could be related to portal gastropathy or gastritis. Sigmoid diverticulosis. No active diverticulitis. Moderate stool burden throughout the colon. There is no bowel obstruction. REPRODUCTIVE ORGANS: Prostate enlargement. PERITONEUM AND RETROPERITONEUM: Moderate free fluid in the abdomen and pelvis. No free air. VASCULATURE: Aorta is normal in caliber. Aortoiliac catheter sclerosis. ABDOMINAL AND PELVIS LYMPH NODES: No lymphadenopathy. BONES AND SOFT TISSUES: Stable severe compression fracture at L1. Diffuse osteopenia. No focal soft tissue abnormality. IMPRESSION: 1. Left lower lobe and lingular consolidation with patchy bilateral ground-glass opacities, most consistent with multifocal pneumonia. 2. Compressive atelectasis versus additional pneumonia in the right lower lobe. 3. Mild to moderate bilateral pleural effusions, right greater than left. 4. Cirrhotic liver morphology with moderate abdominopelvic ascites and splenomegaly . 5. Gastric wall thickening may be related to portal gastropathy or gastritis . 6. Right upper pole nephrolithiasis . 7. Coronary artery disease, aortic atherosclerosis. 8. Sigmoid diverticulosis. Moderate stool burden in the colon. Electronically signed by: Franky Crease MD 04/01/2024 01:36 AM EST RP Workstation: [id number]   DG Chest Port 1 View Result Date: 04/01/2024 EXAM: 1 VIEW(S) XRAY OF THE CHEST 04/01/2024 01:00:00 AM COMPARISON: Comparison from 03/24/2024. CLINICAL HISTORY: Increased lethargy and  shortness of breath. FINDINGS: LUNGS AND PLEURA: Increasing left basilar infiltrate. Small left pleural effusion. No pneumothorax. HEART AND MEDIASTINUM: The cardiac shadow is prominent. The pacemaker is again seen and stable. BONES AND SOFT TISSUES: No bony abnormality is noted. IMPRESSION: 1. Increasing left basilar infiltrate and small effusion. 2. Prominent cardiac shadow with stable pacemaker. Electronically signed by: Oneil Devonshire MD 04/01/2024 01:04 AM EST RP Workstation: [id number]   DG Chest Port 1 View Result Date: 03/24/2024 CLINICAL DATA:  Respiratory distress. EXAM: PORTABLE CHEST 1 VIEW COMPARISON:  03/03/2024 FINDINGS: The cardio pericardial silhouette is enlarged. Retrocardiac atelectasis or infiltrate noted. Right lung clear. Single lead left-sided permanent pacemaker again noted. Telemetry leads overlie the chest. IMPRESSION: Retrocardiac atelectasis or infiltrate. Electronically Signed   By: Camellia Candle M.D.   On: 03/24/2024 07:22    Alm Schneider, DO  [facility]  If 7PM-7AM, please contact night-coverage [email or web address] Password [facility] 04/04/2024, 6:27 PM   LOS: 1 day   "

## 2024-04-04 NOTE — Progress Notes (Signed)
 Palliative: Chart review completed.  Face-to-face discussion with transition of care team related to patient condition, needs, disposition.  [name] has needed morphine  overnight.  Orders adjusted for comfort.  Plan: Comfort and dignity at end-of-life.  Accepted to [facility] for residential hospice, but no beds available at this time.  No charge Jay Birkenhead, NP Palliative medicine team Team phone [phone]

## 2024-04-05 DIAGNOSIS — Z515 Encounter for palliative care: Secondary | ICD-10-CM | POA: Diagnosis not present

## 2024-04-05 NOTE — TOC Progression Note (Signed)
 Transition of Care [facility]) - Progression Note    Patient Details  Name: Jay Campbell MRN: [id number] Date of Birth: February 09, 1946  Transition of Care [facility]) CM/SW Contact  Ronnald MARLA Sil, RN Phone Number: 04/05/2024, 3:29 PM  Clinical Narrative:    Patient discussed during this morning's Progression Rounds, with acknowledgement of patient's Hospice Inpatient status.  CM received a call from [facility] confirming plan for patient to transition to the [facility] for inpatient Hospice care but there are currently No Beds available and the patient has been placed on wait list.  IPCM will continue to follow along and assist as appropriate.   Social Drivers of Health (SDOH) Interventions SDOH Screenings   Food Insecurity: Patient Unable To Answer (04/03/2024)  Housing: Unknown (04/03/2024)  Transportation Needs: Patient Unable To Answer (04/03/2024)  Utilities: Patient Unable To Answer (04/03/2024)  Financial Resource Strain: Low Risk (07/01/2023)   Received from [facility]  Social Connections: Patient Unable To Answer (04/03/2024)  Tobacco Use: Low Risk (04/04/2024)    Readmission Risk Interventions    04/01/2024   11:17 AM 03/31/2024    3:53 PM 03/25/2024    9:58 AM  Readmission Risk Prevention Plan  Transportation Screening Complete Complete Complete  Medication Review [employer]ographer) Complete Complete Complete  PCP or Specialist appointment within 3-5 days of discharge Not Complete    HRI or Home Care Consult Complete  Complete  SW Recovery Care/Counseling Consult Complete Complete Complete  Palliative Care Screening Not Complete  Not Applicable  Skilled Nursing Facility Complete Complete Complete

## 2024-04-05 NOTE — Plan of Care (Signed)
  Problem: Education: Goal: Knowledge of General Education information will improve Description: Including pain rating scale, medication(s)/side effects and non-pharmacologic comfort measures Outcome: Not Met (add Reason)   Problem: Health Behavior/Discharge Planning: Goal: Ability to manage health-related needs will improve Outcome: Not Met (add Reason)

## 2024-04-05 NOTE — Progress Notes (Signed)
 "          PROGRESS NOTE  Jay Campbell FMW:[id number] DOB: Nov 16, 1945 DOA: 04/03/2024 PCP: [name], MD  Brief History:  79 y.o. male with medical history significant for Alzheimer's dementia, history of agitation due to dementia, coronary artery disease, permanent atrial fibrillation previously on Eliquis , complete heart block status post pacemaker placement, chronic thrombocytopenia, type 2 diabetes, hypertension, hyperlipidemia, GERD, BPH, who presents to the ER via EMS from SNF due to shortness of breath and generalized weakness.  The patient was discharged yesterday after being admitted for sepsis secondary to pneumonia and acute hypoxic respiratory failure requiring 2 L nasal cannula.  Staff at SNF reported the patient was more short of breath and lethargic than baseline today.  EMS was activated.  He received albuterol  nebs en route via EMS.   In the ER, hypoxic with O2 saturation of 87% on 2 L nasal cannula.  The patient is alert and confused in the setting of dementia.  Frail-appearing with pressure wounds in heels.  He is minimally verbal and unable to provide a history.     A chest x-ray revealed increasing left basilar infiltrates and small effusion.  Prominent cardiac shadow with stable pacemaker.  Lactic acid 3.9.  There was concern for sepsis.  Subsequently, his CT chest abdomen pelvis without contrast revealed left lower lobe and lingular consolidation with patchy bilateral ground glass opacities more consistent with multifocal pneumonia.  Compressive atelectasis versus additional pneumonia in the right lower lobe.  Mild to moderate bilateral pleural effusions, right greater than left.  Cirrhotic liver morphology with moderate abdominal pelvic ascites and splenomegaly.  Gastric wall thickening may be related to portal gastropathy or gastritis.  Right upper lobe nephrolithiasis.  Coronary artery disease, aortic atherosclerosis.  Sigmoid diverticulosis.  Moderate stool burden in the  colon.   The patient received 1 dose of cefepime  and azithromycin  in the ER.  TRH, hospitalist service, was asked to admit for worsening pneumonia.  Subsequently, the patient's antibiotics were discontinued as the patient received a full course of antibiotics during his last hospitalization from 03/24/2024 to 03/31/2024.  He remained afebrile and hemodynamically stable. Unfortunately, the patient's mental status did not improve much.  He was noted to be dehydrated with a free water  deficit.  Sodium went up to 155.  He was started on IV hypotonic fluid.  Although his sodium did gradually improve, the patient remained somnolent with minimal to no p.o. intake.  He continued to have dysphagia.  The patient was seen by speech therapy.  MBS was performed and did not reveal aspiration; however, patient was felt to be at continued risk for aspiration secondary to his impaired cognition and deconditioning.  A dysphagia 1 diet was ultimately recommended.  Palliative medicine was consulted.  After meetings with the patient's family, the patient's son was ultimately agreeable to transition to full comfort measures after the patient was accepted to residential hospice.  A bed was not available at [facility].  The patient was admitted to the hospital for hospital hospice care until bed is available at [facility] house.  Palliative medicine continue to follow.  They spoke with patient's son and verified desire to transition to full comfort measures.   Assessment/Plan:  Adult Failure to Thrive -- unfortunately, despite maximal medical interventions and recent prolonged hospitalization of 7 days he has bounced back into the hospital within 24 hours. -- Patient has significant dysphagia and unable to eat/drink enough to maintain body requirements for fluids and nutrition --  Admit to medicine consulted -- continue DNR/DNI orders present on admission -Ultimately, patient's family agreed to transition to full comfort measures  after the patient was accepted to residential hospice. - Patient now has been transitioned to general inpatient hospice and for comfort measures have been instituted. - added oral morphine  solution   Multifocal pneumonia, seen on CT scan -- Pneumonia was fully treated during previous hospitalization -- DC antibiotics -- Patient remained afebrile hemodynamically stable - Patient now has been transitioned to general inpatient hospice and for comfort measures have been instituted.   Severe Dysphagia -- SLP evaluation requested -- MBS done--no definitive aspiration -- Patient continued to remain high risk for aspiration -Dysphagia 1 diet ordered>> pleasure feeding -Oral intake remains very poor - Patient now has been transitioned to general inpatient hospice and for comfort measures have been instituted.   Bilateral mild to moderate pleural effusions, seen on CT scan -- Closely monitor volume status -- Secondary to hepatic hydrothorax -Cirrhotic liver noted on CT abdomen and pelvis   Chronic hypoxic respiratory failure secondary to the above -- Recently started on 2 L O2 supplementation during previous admission for pneumonia. -- Unfortunately supplemental oxygen  wasn't provided at facility -Stable on 2 to 3 L presently. - Patient now has been transitioned to general inpatient hospice and for comfort measures have been instituted.   Lactic acidosis -- Secondary to Hypoxia, hypoperfusion, volume depletion -- Lactic acid is trending down  -- Follow peripheral blood cultures x 2. -- IV fluid hydration  -- Sepsis has been ruled out   Elevated troponin, suspect demand ischemia secondary to the above History of coronary artery disease Aortic atherosclerosis High-sensitivity troponin 23, 26. No evidence of acute ischemia on twelve-lead EKG No chest pain reported. Monitor on telemetry.   Chronic HFpEF Last 2D echo done on 05/09/2019 revealed LVEF 60 to 65% Presented with dyspnea,  cardiomegaly, and bilateral pleural effusions -04/01/2024 echo EF 60 to 65%, no WMA, normal RVF - Patient now has been transitioned to general inpatient hospice and for comfort measures have been instituted.   Hypernatremia Speech therapy is consulted to rule out dysphagia Hypotonic IV fluid to correct free water  deficit --Patient now transition to full comfort measures   Hypokalemia -initially repleted -now on comfort measures   Gastric wall thickening, may be related to portal gastropathy or gastritis, seen on CT scan. IV PPI Protonix  40 mg daily initially No reported abdominal pain or GI bleed Avoid NSAIDs Patient may transition to full comfort measures   Generalized weakness He is chronically debilitated and this is unchanged Palliative consultation requested    Type 2 diabetes with hyperglycemia Last hemoglobin A1c 7.3 on 03/24/2024   Hypertension Resume home Lopressor  Closely monitor vital signs.   Alzheimer's dementia with history of agitation Resume home regimen. Aricept  held due to transient bradycardia. -now on comfort measures care   Permanent atrial fibrillation, no longer on Eliquis  due to chronic thrombocytopenia Currently rate controlled --- Patient now has been transitioned to general inpatient hospice and for comfort measures have been instituted.             Family Communication:   no Family at bedside   Consultants:  palliative   Code Status:  FULL COMFORT   DVT Prophylaxis:  FULL COMFORT     Procedures: As Listed in Progress Note Above   Antibiotics: none    Subjective: Pt is resting comfortably.  No reports of respiratory distress or uncontrolled pain.  Review of systems not possible secondary  to encephalopathy.  Objective: Vitals:   04/04/24 0453 04/05/24 0500 04/05/24 1331  BP: (!) 117/44 (!) 133/58 (!) 127/54  Pulse: 65 62 63  Resp: 20 12 14   Temp: 97.9 F (36.6 C) 97.9 F (36.6 C) 97.6 F (36.4 C)  TempSrc: Axillary  Axillary Axillary  SpO2: 94% 99% 100%   No intake or output data in the 24 hours ending 04/05/24 1752 Weight change:  Exam:  General:  Pt is somnolent, does not follows command appropriately, not in acute distress HEENT: No icterus, No thrush, No neck mass, [place]/AT Cardiovascular: RRR, S1/S2, no rubs, no gallops Respiratory: Bibasilar rales Abdomen: Soft/+BS, non tender, non distended, no guarding Extremities: No edema, No lymphangitis, No petechiae, No rashes, no synovitis   Data Reviewed: I have personally reviewed following labs and imaging studies Basic Metabolic Panel: Recent Labs  Lab 04/01/24 0050 04/01/24 0709 04/01/24 1150 04/02/24 0444 04/03/24 0450  NA 155* 155* 154* 150* 146*  K 3.8 3.6 3.3* 3.6 3.3*  CL 121* 120* 121* 119* 114*  CO2 22 24 22  20* 22  GLUCOSE 199* 224* 213* 101* 119*  BUN 52* 49* 48* 41* 35*  CREATININE 1.46* 1.35* 1.28* 1.17 0.99  CALCIUM 8.5* 8.3* 8.0* 7.7* 7.2*  MG 2.1  --   --  1.8  --   PHOS  --   --   --  2.9  --    Liver Function Tests: Recent Labs  Lab 04/01/24 0050  AST 20  ALT 15  ALKPHOS 101  BILITOT 0.8  PROT 6.6  ALBUMIN 3.0*   No results for input(s): LIPASE, AMYLASE in the last 168 hours. No results for input(s): AMMONIA in the last 168 hours. Coagulation Profile: No results for input(s): INR, PROTIME in the last 168 hours. CBC: Recent Labs  Lab 04/01/24 0050 04/02/24 0444  WBC 6.9 5.2  NEUTROABS 5.8 4.3  HGB 11.1* 10.7*  HCT 34.3* 33.6*  MCV 106.9* 108.4*  PLT 66* 43*   Cardiac Enzymes: No results for input(s): CKTOTAL, CKMB, CKMBINDEX, TROPONINI in the last 168 hours. BNP: Invalid input(s): POCBNP CBG: Recent Labs  Lab 04/02/24 2000 04/03/24 0000 04/03/24 0407 04/03/24 0731 04/03/24 1123  GLUCAP 179* 97 108* 134* 106*   HbA1C: No results for input(s): HGBA1C in the last 72 hours. Urine analysis:    Component Value Date/Time   COLORURINE COLORLESS (A) 04/01/2024 0658    APPEARANCEUR CLEAR 04/01/2024 0658   LABSPEC 1.008 04/01/2024 0658   PHURINE 5.0 04/01/2024 0658   GLUCOSEU NEGATIVE 04/01/2024 0658   HGBUR MODERATE (A) 04/01/2024 0658   BILIRUBINUR NEGATIVE 04/01/2024 0658   KETONESUR NEGATIVE 04/01/2024 0658   PROTEINUR NEGATIVE 04/01/2024 0658   NITRITE NEGATIVE 04/01/2024 0658   LEUKOCYTESUR NEGATIVE 04/01/2024 0658   Sepsis Labs: @LABRCNTIP (procalcitonin:4,lacticidven:4) ) Recent Results (from the past 240 hours)  Blood Culture (routine x 2)     Status: None (Preliminary result)   Collection Time: 04/01/24 12:50 AM   Specimen: BLOOD RIGHT FOREARM  Result Value Ref Range Status   Specimen Description BLOOD RIGHT FOREARM  Final   Special Requests   Final    BOTTLES DRAWN AEROBIC AND ANAEROBIC Blood Culture adequate volume   Culture   Final    NO GROWTH 4 DAYS Performed at [facility], [address], [place], [place] [address]    Report Status PENDING  Incomplete  Blood Culture (routine x 2)     Status: None (Preliminary result)   Collection Time: 04/01/24  1:39 AM  Specimen: Right Antecubital; Blood  Result Value Ref Range Status   Specimen Description RIGHT ANTECUBITAL  Final   Special Requests   Final    BOTTLES DRAWN AEROBIC AND ANAEROBIC Blood Culture adequate volume   Culture   Final    NO GROWTH 4 DAYS Performed at [facility], [address], [place], [place] [address]    Report Status PENDING  Incomplete     Scheduled Meds: Continuous Infusions:  Procedures/Studies: DG Swallowing Func-Speech Pathology Result Date: 04/02/2024 Table formatting from the original result was not included. Modified Barium Swallow Study Patient Details Name: Jay Campbell MRN: [id number] Date of Birth: Dec 01, 1945 Today's Date: 04/02/2024 HPI/PMH: HPI: OLGA SEYLER is a 79 y.o. male with medical history significant for Alzheimer's dementia, history of agitation due to dementia, coronary artery disease, permanent atrial fibrillation previously  on Eliquis , complete heart block status post pacemaker placement, chronic thrombocytopenia, type 2 diabetes, hypertension, hyperlipidemia, GERD, BPH, who presents to the ER via EMS from SNF due to shortness of breath and generalized weakness. The patient was discharged yesterday after being admitted for sepsis secondary to pneumonia and acute hypoxic respiratory failure requiring 2 L nasal cannula. Staff at SNF reported the patient was more short of breath and lethargic than baseline today. EMS was activated. He received albuterol  nebs en route via EMS. In the ER, hypoxic with O2 saturation of 87% on 2 L nasal cannula. The patient is alert and confused in the setting of dementia. Frail-appearing with pressure wounds in heels. He is minimally verbal and unable to provide a history. A chest x-ray revealed increasing left basilar infiltrates and small effusion. Prominent cardiac shadow with stable pacemaker. Lactic acid 3.9. There was concern for sepsis. Subsequently, his CT chest abdomen pelvis without contrast revealed left lower lobe and lingular consolidation with patchy bilateral ground glass opacities more consistent with multifocal pneumonia. Compressive atelectasis versus additional pneumonia in the right lower lobe. Mild to moderate bilateral pleural effusions, right greater than left. Pt was seen by SLP service on Thursday and was discharged over the weekend. BSE completed and pt exhibiting overt s/s of aspiration and placed NPO. MBS ordered to determine safest diet and assess swallow function objectively. Clinical Impression: Clinical Impression: Pt presents with mild oropharyngeal dysphagia compounded by cognitive-based dysphagia c/b escape to lateral buccal cavity/floor of mouth and slow, prolonged mastication with solids.  Delayed initiation of tongue motion (oral holding) and delayed initiation of the swallow (typical for age) at the level of the valleculae throughout study.  Diminished pharyngeal  stripping wave and partial epiglottic inflection observed potentially d/t bulging PPW/?cervical osteophytes.  Decreased tongue base retraction resulting in diffuse residue within the vallecular space, pyriform sinuses and posterior pharyngeal wall intermittently during study.  It should be noted pt exhibited xerostomia prior to any po intake d/t NPO status which may have contributed to amount of residue within pharynx and other structures.  As the study progressed and compensatory strategies instilled including liquid wash, alternating solids/liquids and multiple effortful swallows, residue cleared to mild-insignificant amount within affected structures.  Partial distension/obstruction of flow observed in UES d/t prominent CP bar presence.  NO aspiration and/or penetration observed throughout study, but d/t pt's impaired cognition and deconditioning/dysphagia, he is at risk for aspiration if swallow strategies/A with meals/full supervision is not followed.  Recommend initiating a Dysphagia 1(puree)/thin liquid diet via tsp amounts with FULL supervision/A with meals.  ST will f/u for dysphagia tx/education during remainder of acute stay.  ST should f/u  at next venue of care. Factors that may increase risk of adverse event in presence of aspiration Noe & Lianne 2021): Factors that may increase risk of adverse event in presence of aspiration Noe & Lianne 2021): Poor general health and/or compromised immunity; Reduced cognitive function; Frail or deconditioned; Weak cough; Aspiration of thick, dense, and/or acidic materials Recommendations/Plan: Swallowing Evaluation Recommendations Swallowing Evaluation Recommendations Recommendations: PO diet PO Diet Recommendation: Dysphagia 1 (Pureed); Thin liquids (Level 0) Liquid Administration via: Spoon Medication Administration: Crushed with puree Supervision: Full assist for feeding; Full supervision/cueing for swallowing strategies Swallowing strategies  : Slow  rate; Small bites/sips; Minimize environmental distractions; effortful swallow; Multiple dry swallows after each bite/sip; Follow solids with liquids Postural changes: Position pt fully upright for meals; Stay upright 30-60 min after meals Oral care recommendations: Oral care BID (2x/day); Staff/trained caregiver to provide oral care Treatment Plan Treatment Plan Treatment recommendations: Therapy as outlined in treatment plan below Follow-up recommendations: Skilled nursing-short term rehab (<3 hours/day) Functional status assessment: Patient has had a recent decline in their functional status and demonstrates the ability to make significant improvements in function in a reasonable and predictable amount of time. Treatment frequency: Min 2x/week Treatment duration: 1 week Interventions: Aspiration precaution training; Compensatory techniques; Patient/family education; Trials of upgraded texture/liquids; Diet toleration management by SLP Recommendations Recommendations for follow up therapy are one component of a multi-disciplinary discharge planning process, led by the attending physician.  Recommendations may be updated based on patient status, additional functional criteria and insurance authorization. Assessment: Orofacial Exam: Orofacial Exam Oral Cavity: Oral Hygiene: Xerostomia Oral Cavity - Dentition: Poor condition; Missing dentition Orofacial Anatomy: WFL Oral Motor/Sensory Function: Generalized oral weakness Anatomy: Anatomy: WFL Boluses Administered: Boluses Administered Boluses Administered: Thin liquids (Level 0); Mildly thick liquids (Level 2, nectar thick); Moderately thick liquids (Level 3, honey thick); Puree; Solid  Oral Impairment Domain: Oral Impairment Domain Lip Closure: No labial escape Tongue control during bolus hold: Escape to lateral buccal cavity/floor of mouth Bolus preparation/mastication: Slow prolonged chewing/mashing with complete recollection Bolus transport/lingual motion: Delayed  initiation of tongue motion (oral holding) Oral residue: Trace residue lining oral structures Location of oral residue : Tongue Initiation of pharyngeal swallow : Valleculae  Pharyngeal Impairment Domain: Pharyngeal Impairment Domain Soft palate elevation: No bolus between soft palate (SP)/pharyngeal wall (PW) Laryngeal elevation: Complete superior movement of thyroid  cartilage with complete approximation of arytenoids to epiglottic petiole Anterior hyoid excursion: Complete anterior movement Epiglottic movement: Partial inversion Laryngeal vestibule closure: Complete, no air/contrast in laryngeal vestibule Pharyngeal stripping wave : Present - diminished Pharyngeal contraction (A/P view only): N/A Pharyngoesophageal segment opening: Partial distention/partial duration, partial obstruction of flow Tongue base retraction: Narrow column of contrast or air between tongue base and PPW Pharyngeal residue: Collection of residue within or on pharyngeal structures Location of pharyngeal residue: Diffuse (>3 areas)  Esophageal Impairment Domain: Esophageal Impairment Domain Esophageal clearance upright position: Complete clearance, esophageal coating Pill: Pill Consistency administered: -- (n/a) Penetration/Aspiration Scale Score: Penetration/Aspiration Scale Score 1.  Material does not enter airway: Thin liquids (Level 0); Mildly thick liquids (Level 2, nectar thick); Moderately thick liquids (Level 3, honey thick); Puree; Solid Compensatory Strategies: Compensatory Strategies Compensatory strategies: Yes Effortful swallow: Effective Effective Effortful Swallow: Thin liquid (Level 0); Mildly thick liquid (Level 2, nectar thick); Puree Multiple swallows: Effective Effective Multiple Swallows: Thin liquid (Level 0); Mildly thick liquid (Level 2, nectar thick); Puree Liquid wash: Effective Effective Liquid Wash: Thin liquid (Level 0); Mildly thick liquid (Level 2, nectar thick); Puree;  Solid   General Information: Caregiver  present: No  Diet Prior to this Study: NPO   Temperature : Normal   Respiratory Status: WFL   Supplemental O2: Nasal cannula (3L)   History of Recent Intubation: No  Behavior/Cognition: Alert; Cooperative; Distractible; Requires cueing Self-Feeding Abilities: Dependent for feeding Baseline vocal quality/speech: Hypophonia/low volume; Dysphonic Volitional Cough: Able to elicit Volitional Swallow: Unable to elicit Exam Limitations: No limitations Goal Planning: Prognosis for improved oropharyngeal function: Good Barriers to Reach Goals: Cognitive deficits No data recorded Patient/Family Stated Goal: n/a Consulted and agree with results and recommendations: Patient; Pt unable/family or caregiver not available; Nurse; Physician Pain: Pain Assessment Pain Assessment: Faces Faces Pain Scale: 2 Breathing: 0 Negative Vocalization: 1 Facial Expression: 1 Body Language: 0 Consolability: 0 PAINAD Score: 0 Pain Location: generalized with movement Pain Descriptors / Indicators: Discomfort; Grimacing Pain Intervention(s): Limited activity within patient's tolerance; Repositioned End of Session: Start Time:SLP Start Time (ACUTE ONLY): 1014 Stop Time: SLP Stop Time (ACUTE ONLY): 1050 Time Calculation:SLP Time Calculation (min) (ACUTE ONLY): 36 min Charges: SLP Evaluations $ SLP Speech Visit: 1 Visit SLP Evaluations $BSS Swallow: 1 Procedure $MBS Swallow: 1 Procedure SLP visit diagnosis: SLP Visit Diagnosis: Dysphagia, oropharyngeal phase (R13.12) Past Medical History: Past Medical History: Diagnosis Date  Alzheimer's dementia (HCC)   Anxiety   Atrial fibrillation (HCC)   Permanent  Coronary artery disease   Mild nonobstructive 1/12  DM (diabetes mellitus) (HCC)   GERD (gastroesophageal reflux disease)   Gout   Hearing disorder, cochlear   Hiatal hernia   HLD (hyperlipidemia)   HTN (hypertension)   S/P AV nodal ablation   s/p SJM PPM; gen change 02-01-13 by Dr Kelsie  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  Warfarin anticoagulation   Past Surgical History: Past Surgical History: Procedure Laterality Date  CATARACT EXTRACTION Right   COCHLEAR IMPLANT Right   HEMORRHOID BANDING    KNEE ARTHROSCOPY Right 1992  Dr Brenna   PACEMAKER GENERATOR CHANGE Bilateral 02/01/2013  Procedure: PACEMAKER GENERATOR CHANGE;  Surgeon: Lynwood JONETTA Kelsie, MD;  Location: [facility] CATH [facility];  Service: Cardiovascular;  Laterality: Bilateral;  PACEMAKER PLACEMENT  2002; 2014  SJM implanted by Dr Waddell with AV nodal ablation performed; gen change to [employer] SR RF by Dr Kelsie 02-01-13  RETINAL LASER PROCEDURE Left   SLT LASER APPLICATION Left 12/14/2015  Procedure: SLT LASER APPLICATION;  Surgeon: Dow JULIANNA Burke, MD;  Location: [facility];  Service: Ophthalmology;  Laterality: Left; Pat Adams,M.S.,CCC-SLP 04/02/2024, 11:23 AM  ECHOCARDIOGRAM COMPLETE Result Date: 04/01/2024    ECHOCARDIOGRAM REPORT   Patient Name:   ORANGE HILLIGOSS Date of Exam: 04/01/2024 Medical Rec #:  [id number]        Height:       65.0 in Accession #:    [id number]       Weight:       175.7 lb Date of Birth:  09-05-1945        BSA:          1.872 m Patient Age:    78 years         BP:           158/69 mmHg Patient Gender: M                HR:           63 bpm. Exam Location:  [facility] Procedure: 2D Echo, Cardiac Doppler and Color Doppler (Both Spectral and Color  Flow Doppler were utilized during procedure). Indications:    CHF-Acute Diastolic I50.31  History:        Patient has prior history of Echocardiogram examinations, most                 recent 05/09/2019. CAD, Pacemaker, Arrythmias:Atrial Fibrillation                 and Complete heart block (HCC); Risk Factors:Hypertension,                 Diabetes, Dyslipidemia and Non-Smoker. Alzheimer's dementia                 (HCC) (From Hx).  Sonographer:    Aida Pizza RCS Referring Phys: [id number] TERRY LOISE HURST  Sonographer Comments: Image acquisition challenging due to patient behavioral factors. IMPRESSIONS  1. Left ventricular ejection fraction,  by estimation, is 60 to 65%. The left ventricle has normal function. The left ventricle has no regional wall motion abnormalities. There is mild left ventricular hypertrophy. Left ventricular diastolic parameters are indeterminate.  2. Right ventricular systolic function is normal. The right ventricular size is mildly enlarged. There is normal pulmonary artery systolic pressure.  3. Left atrial size was severely dilated.  4. Right atrial size was severely dilated.  5. The mitral valve is abnormal. Mild to moderate mitral valve regurgitation. No evidence of mitral stenosis. Severe mitral annular calcification.  6. The aortic valve is tricuspid. There is mild calcification of the aortic valve. There is mild thickening of the aortic valve. Aortic valve regurgitation is not visualized. No aortic stenosis is present. Aortic valve mean gradient measures 6.0 mmHg.  7. Aortic dilatation noted. There is borderline dilatation of the aortic root, measuring 39 mm.  8. The inferior vena cava is normal in size with <50% respiratory variability, suggesting right atrial pressure of 8 mmHg. Comparison(s): No significant change from prior study. FINDINGS  Left Ventricle: Left ventricular ejection fraction, by estimation, is 60 to 65%. The left ventricle has normal function. The left ventricle has no regional wall motion abnormalities. Strain was performed and the global longitudinal strain is indeterminate. The left ventricular internal cavity size was normal in size. There is mild left ventricular hypertrophy. Left ventricular diastolic parameters are indeterminate. Right Ventricle: The right ventricular size is mildly enlarged. No increase in right ventricular wall thickness. Right ventricular systolic function is normal. There is normal pulmonary artery systolic pressure. The tricuspid regurgitant velocity is 2.34  m/s, and with an assumed right atrial pressure of 8 mmHg, the estimated right ventricular systolic pressure is 29.9  mmHg. Left Atrium: Left atrial size was severely dilated. Right Atrium: Right atrial size was severely dilated. Pericardium: There is no evidence of pericardial effusion. Mitral Valve: The mitral valve is abnormal. Severe mitral annular calcification. Mild to moderate mitral valve regurgitation. No evidence of mitral valve stenosis. Tricuspid Valve: The tricuspid valve is normal in structure. Tricuspid valve regurgitation is mild . No evidence of tricuspid stenosis. Aortic Valve: The aortic valve is tricuspid. There is mild calcification of the aortic valve. There is mild thickening of the aortic valve. Aortic valve regurgitation is not visualized. No aortic stenosis is present. Aortic valve mean gradient measures 6.0 mmHg. Aortic valve peak gradient measures 11.3 mmHg. Aortic valve area, by VTI measures 1.90 cm. Pulmonic Valve: The pulmonic valve was normal in structure. Pulmonic valve regurgitation is trivial. No evidence of pulmonic stenosis. Aorta: Aortic dilatation noted. There is borderline dilatation of the aortic root, measuring  39 mm. Venous: The inferior vena cava is normal in size with less than 50% respiratory variability, suggesting right atrial pressure of 8 mmHg. IAS/Shunts: No atrial level shunt detected by color flow Doppler. Additional Comments: 3D was performed not requiring image post processing on an independent workstation and was indeterminate.  LEFT VENTRICLE PLAX 2D LVIDd:         4.80 cm LVIDs:         3.00 cm LV PW:         1.20 cm LV IVS:        1.20 cm LVOT diam:     2.10 cm LV SV:         56 LV SV Index:   30 LVOT Area:     3.46 cm  RIGHT VENTRICLE RV S prime:     14.30 cm/s TAPSE (M-mode): 2.7 cm LEFT ATRIUM              Index        RIGHT ATRIUM           Index LA diam:        5.90 cm  3.15 cm/m   RA Area:     38.35 cm LA Vol (A2C):   138.5 ml 73.98 ml/m  RA Volume:   150.00 ml 80.12 ml/m LA Vol (A4C):   182.0 ml 97.21 ml/m LA Biplane Vol: 165.0 ml 88.13 ml/m  AORTIC VALVE AV  Area (Vmax):    1.64 cm AV Area (Vmean):   1.83 cm AV Area (VTI):     1.90 cm AV Vmax:           168.00 cm/s AV Vmean:          106.000 cm/s AV VTI:            0.294 m AV Peak Grad:      11.3 mmHg AV Mean Grad:      6.0 mmHg LVOT Vmax:         79.40 cm/s LVOT Vmean:        56.100 cm/s LVOT VTI:          0.161 m LVOT/AV VTI ratio: 0.55  AORTA Ao Root diam: 3.90 cm MITRAL VALVE                TRICUSPID VALVE MV Area (PHT): 3.27 cm     TR Peak grad:   21.9 mmHg MV Decel Time: 232 msec     TR Vmax:        234.00 cm/s MR Peak grad: 119.7 mmHg MR Mean grad: 75.0 mmHg     SHUNTS MR Vmax:      547.00 cm/s   Systemic VTI:  0.16 m MR Vmean:     401.0 cm/s    Systemic Diam: 2.10 cm MV E velocity: 107.00 cm/s MV A velocity: 33.30 cm/s MV E/A ratio:  3.21 Vishnu Priya Mallipeddi Electronically signed by Diannah Late Mallipeddi Signature Date/Time: 04/01/2024/2:10:59 PM    Final    CT CHEST ABDOMEN PELVIS WO CONTRAST Result Date: 04/01/2024 EXAM: CT CHEST, ABDOMEN AND PELVIS WITHOUT CONTRAST 04/01/2024 01:25:10 AM TECHNIQUE: CT of the chest, abdomen and pelvis was performed without the administration of intravenous contrast. Multiplanar reformatted images are provided for review. Automated exposure control, iterative reconstruction, and/or weight based adjustment of the mA/kV was utilized to reduce the radiation dose to as low as reasonably achievable. COMPARISON: 02/07/2023. CLINICAL HISTORY: Sepsis. FINDINGS: CHEST: MEDIASTINUM AND LYMPH NODES: Cardiomegaly. Pacer wires in  the right heart. Coronary artery aortic atherosclerosis. The central airways are clear. No mediastinal, hilar or axillary lymphadenopathy. LUNGS AND PLEURA: Mild to moderate bilateral pleural effusions, right greater than left. Consolidation with air bronchograms in the left lower lobe and posterior lingula concerning for pneumonia. Compressive atelectasis or pneumonia in the right lower lobe. Patchy ground glass airspace opacities also likely related  to pneumonia. No pneumothorax. ABDOMEN AND PELVIS: LIVER: Mild nodular contours of the liver suggest cirrhosis. GALLBLADDER AND BILE DUCTS: High-density material within the gallbladder could reflect small stones or contrast material. No biliary ductal dilatation. SPLEEN: The spleen is mildly enlarged at 13 cm craniocaudal length. PANCREAS: No acute abnormality. ADRENAL GLANDS: No acute abnormality. KIDNEYS, URETERS AND BLADDER: 1.6 cm stone in the upper pole of the right kidney. No hydronephrosis. No perinephric or periureteral stranding. Urinary bladder is unremarkable. GI AND BOWEL: The stomach is decompressed but appears to be thick-walled. This could be related to portal gastropathy or gastritis. Sigmoid diverticulosis. No active diverticulitis. Moderate stool burden throughout the colon. There is no bowel obstruction. REPRODUCTIVE ORGANS: Prostate enlargement. PERITONEUM AND RETROPERITONEUM: Moderate free fluid in the abdomen and pelvis. No free air. VASCULATURE: Aorta is normal in caliber. Aortoiliac catheter sclerosis. ABDOMINAL AND PELVIS LYMPH NODES: No lymphadenopathy. BONES AND SOFT TISSUES: Stable severe compression fracture at L1. Diffuse osteopenia. No focal soft tissue abnormality. IMPRESSION: 1. Left lower lobe and lingular consolidation with patchy bilateral ground-glass opacities, most consistent with multifocal pneumonia. 2. Compressive atelectasis versus additional pneumonia in the right lower lobe. 3. Mild to moderate bilateral pleural effusions, right greater than left. 4. Cirrhotic liver morphology with moderate abdominopelvic ascites and splenomegaly . 5. Gastric wall thickening may be related to portal gastropathy or gastritis . 6. Right upper pole nephrolithiasis . 7. Coronary artery disease, aortic atherosclerosis. 8. Sigmoid diverticulosis. Moderate stool burden in the colon. Electronically signed by: Franky Crease MD 04/01/2024 01:36 AM EST RP Workstation: [id number]   DG Chest Port 1  View Result Date: 04/01/2024 EXAM: 1 VIEW(S) XRAY OF THE CHEST 04/01/2024 01:00:00 AM COMPARISON: Comparison from 03/24/2024. CLINICAL HISTORY: Increased lethargy and shortness of breath. FINDINGS: LUNGS AND PLEURA: Increasing left basilar infiltrate. Small left pleural effusion. No pneumothorax. HEART AND MEDIASTINUM: The cardiac shadow is prominent. The pacemaker is again seen and stable. BONES AND SOFT TISSUES: No bony abnormality is noted. IMPRESSION: 1. Increasing left basilar infiltrate and small effusion. 2. Prominent cardiac shadow with stable pacemaker. Electronically signed by: Oneil Devonshire MD 04/01/2024 01:04 AM EST RP Workstation: [id number]   DG Chest Port 1 View Result Date: 03/24/2024 CLINICAL DATA:  Respiratory distress. EXAM: PORTABLE CHEST 1 VIEW COMPARISON:  03/03/2024 FINDINGS: The cardio pericardial silhouette is enlarged. Retrocardiac atelectasis or infiltrate noted. Right lung clear. Single lead left-sided permanent pacemaker again noted. Telemetry leads overlie the chest. IMPRESSION: Retrocardiac atelectasis or infiltrate. Electronically Signed   By: Camellia Candle M.D.   On: 03/24/2024 07:22    Alm Schneider, DO  [facility]  If 7PM-7AM, please contact night-coverage [email or web address] Password [facility] 04/05/2024, 5:52 PM   LOS: 2 days   "

## 2024-04-06 DIAGNOSIS — Z515 Encounter for palliative care: Secondary | ICD-10-CM | POA: Diagnosis not present

## 2024-04-06 LAB — CULTURE, BLOOD (ROUTINE X 2)
Culture: NO GROWTH
Culture: NO GROWTH
Special Requests: ADEQUATE
Special Requests: ADEQUATE

## 2024-04-06 NOTE — Plan of Care (Signed)

## 2024-04-06 NOTE — Progress Notes (Addendum)
 "          PROGRESS NOTE  Jay Campbell FMW:[id number] DOB: Apr 29, 1945 DOA: 04/03/2024 PCP: [name], MD  Brief History:  79 y.o. male with medical history significant for Alzheimer's dementia, history of agitation due to dementia, coronary artery disease, permanent atrial fibrillation previously on Eliquis , complete heart block status post pacemaker placement, chronic thrombocytopenia, type 2 diabetes, hypertension, hyperlipidemia, GERD, BPH, who presents to the ER via EMS from SNF due to shortness of breath and generalized weakness.  The patient was discharged yesterday after being admitted for sepsis secondary to pneumonia and acute hypoxic respiratory failure requiring 2 L nasal cannula.  Staff at SNF reported the patient was more short of breath and lethargic than baseline today.  EMS was activated.  He received albuterol  nebs en route via EMS.   In the ER, hypoxic with O2 saturation of 87% on 2 L nasal cannula.  The patient is alert and confused in the setting of dementia.  Frail-appearing with pressure wounds in heels.  He is minimally verbal and unable to provide a history.     A chest x-ray revealed increasing left basilar infiltrates and small effusion.  Prominent cardiac shadow with stable pacemaker.  Lactic acid 3.9.  There was concern for sepsis.  Subsequently, his CT chest abdomen pelvis without contrast revealed left lower lobe and lingular consolidation with patchy bilateral ground glass opacities more consistent with multifocal pneumonia.  Compressive atelectasis versus additional pneumonia in the right lower lobe.  Mild to moderate bilateral pleural effusions, right greater than left.  Cirrhotic liver morphology with moderate abdominal pelvic ascites and splenomegaly.  Gastric wall thickening may be related to portal gastropathy or gastritis.  Right upper lobe nephrolithiasis.  Coronary artery disease, aortic atherosclerosis.  Sigmoid diverticulosis.  Moderate stool burden in the  colon.   The patient received 1 dose of cefepime  and azithromycin  in the ER.  TR[facility], hospitalist service, was asked to admit for worsening pneumonia.  Subsequently, the patient's antibiotics were discontinued as the patient received a full course of antibiotics during his last hospitalization from 03/24/2024 to 03/31/2024.  He remained afebrile and hemodynamically stable. Unfortunately, the patient's mental status did not improve much.  He was noted to be dehydrated with a free water  deficit.  Sodium went up to 155.  He was started on IV hypotonic fluid.  Although his sodium did gradually improve, the patient remained somnolent with minimal to no p.o. intake.  He continued to have dysphagia.  The patient was seen by speech therapy.  MBS was performed and did not reveal aspiration; however, patient was felt to be at continued risk for aspiration secondary to his impaired cognition and deconditioning.  A dysphagia 1 diet was ultimately recommended.  Palliative medicine was consulted.  After meetings with the patient's family, the patient's son was ultimately agreeable to transition to full comfort measures after the patient was accepted to residential hospice.  A bed was not available at [facility].  The patient was admitted to the hospital for hospital hospice care until bed is available at [facility] house.  Palliative medicine continue to follow.  They spoke with patient's son and verified desire to transition to full comfort measures.   Assessment/Plan:  Adult Failure to Thrive -- unfortunately, despite maximal medical interventions and recent prolonged hospitalization of 7 days he has bounced back into the hospital within 24 hours. -- Patient has significant dysphagia and unable to eat/drink enough to maintain body requirements for fluids and nutrition --  Admit to medicine consulted -- continue DNR/DNI orders present on admission -Ultimately, patient's family agreed to transition to full comfort measures  after the patient was accepted to residential hospice. - Patient now has been transitioned to general inpatient hospice and for comfort measures have been instituted. - added oral morphine  solution   Multifocal pneumonia, seen on CT scan -- Pneumonia was fully treated during previous hospitalization -- DC antibiotics -- Patient remained afebrile hemodynamically stable - Patient now has been transitioned to general inpatient hospice and for comfort measures have been instituted.   Severe Dysphagia -- SLP evaluation requested -- MBS done--no definitive aspiration -- Patient continued to remain high risk for aspiration -Dysphagia 1 diet ordered>> pleasure feeding -Oral intake remains very poor - Patient now has been transitioned to general inpatient hospice and for comfort measures have been instituted.   Bilateral mild to moderate pleural effusions, seen on CT scan -- Closely monitor volume status -- Secondary to hepatic hydrothorax -Cirrhotic liver noted on CT abdomen and pelvis   Chronic hypoxic respiratory failure secondary to the above -- Recently started on 2 L O2 supplementation during previous admission for pneumonia. -- Unfortunately supplemental oxygen  wasn't provided at facility -Stable on 2 to 3 L presently. - Patient now has been transitioned to general inpatient hospice and for comfort measures have been instituted.   Lactic acidosis -- Secondary to Hypoxia, hypoperfusion, volume depletion -- Lactic acid is trending down  -- Follow peripheral blood cultures x 2. -- IV fluid hydration  -- Sepsis has been ruled out   Elevated troponin, suspect demand ischemia secondary to the above History of coronary artery disease Aortic atherosclerosis High-sensitivity troponin 23, 26. No evidence of acute ischemia on twelve-lead EKG No chest pain reported. Monitor on telemetry.   Chronic HFpEF Last 2D echo done on 05/09/2019 revealed LVEF 60 to 65% Presented with dyspnea,  cardiomegaly, and bilateral pleural effusions -04/01/2024 echo EF 60 to 65%, no WMA, normal RVF - Patient now has been transitioned to general inpatient hospice and for comfort measures have been instituted.   Hypernatremia Speech therapy is consulted to rule out dysphagia Hypotonic IV fluid to correct free water  deficit --Patient now transition to full comfort measures   Hypokalemia -initially repleted -now on comfort measures   Gastric wall thickening, may be related to portal gastropathy or gastritis, seen on CT scan. IV PPI Protonix  40 mg daily initially No reported abdominal pain or GI bleed Avoid NSAIDs Patient may transition to full comfort measures   Generalized weakness He is chronically debilitated and this is unchanged Palliative consultation requested    Type 2 diabetes with hyperglycemia Last hemoglobin A1c 7.3 on 03/24/2024   Hypertension Resume home Lopressor  Closely monitor vital signs.   Alzheimer's dementia with history of agitation Resume home regimen. Aricept  held due to transient bradycardia. -now on comfort measures care   Permanent atrial fibrillation, no longer on Eliquis  due to chronic thrombocytopenia Currently rate controlled --- Patient now has been transitioned to general inpatient hospice and for comfort measures have been instituted.             Family Communication:   no Family at bedside   Consultants:  palliative   Code Status:  FULL COMFORT   DVT Prophylaxis:  FULL COMFORT     Procedures: As Listed in Progress Note Above   Antibiotics: none          Subjective: Patient is somnolent.  There is no respiratory distress.  No uncontrolled pain.  No vomiting or diarrhea noted.  Objective: Vitals:   04/05/24 0500 04/05/24 1331 04/05/24 2013 04/06/24 1445  BP: (!) 133/58 (!) 127/54 97/69 137/63  Pulse: 62 63 64 63  Resp: 12 14 16 20   Temp: 97.9 F (36.6 C) 97.6 F (36.4 C) 97.9 F (36.6 C) 98 F (36.7 C)   TempSrc: Axillary Axillary Oral Oral  SpO2: 99% 100%  96%   No intake or output data in the 24 hours ending 04/06/24 1954 Weight change:  Exam:  General:  Pt is somnolent, does not follows commands appropriately, not in acute distress HEENT: No icterus, No thrush, No neck mass, [place]/AT Cardiovascular: RRR, S1/S2, no rubs, no gallops Respiratory: Bibasilar crackles.  No wheezing Abdomen: Soft/+BS, non tender, non distended, no guarding Extremities: No edema, No lymphangitis, No petechiae, No rashes, no synovitis   Data Reviewed: I have personally reviewed following labs and imaging studies Basic Metabolic Panel: Recent Labs  Lab 04/01/24 0050 04/01/24 0709 04/01/24 1150 04/02/24 0444 04/03/24 0450  NA 155* 155* 154* 150* 146*  K 3.8 3.6 3.3* 3.6 3.3*  CL 121* 120* 121* 119* 114*  CO2 22 24 22  20* 22  GLUCOSE 199* 224* 213* 101* 119*  BUN 52* 49* 48* 41* 35*  CREATININE 1.46* 1.35* 1.28* 1.17 0.99  CALCIUM 8.5* 8.3* 8.0* 7.7* 7.2*  MG 2.1  --   --  1.8  --   PHOS  --   --   --  2.9  --    Liver Function Tests: Recent Labs  Lab 04/01/24 0050  AST 20  ALT 15  ALKPHOS 101  BILITOT 0.8  PROT 6.6  ALBUMIN 3.0*   No results for input(s): LIPASE, AMYLASE in the last 168 hours. No results for input(s): AMMONIA in the last 168 hours. Coagulation Profile: No results for input(s): INR, PROTIME in the last 168 hours. CBC: Recent Labs  Lab 04/01/24 0050 04/02/24 0444  WBC 6.9 5.2  NEUTROABS 5.8 4.3  HGB 11.1* 10.7*  HCT 34.3* 33.6*  MCV 106.9* 108.4*  PLT 66* 43*   Cardiac Enzymes: No results for input(s): CKTOTAL, CKMB, CKMBINDEX, TROPONINI in the last 168 hours. BNP: Invalid input(s): POCBNP CBG: Recent Labs  Lab 04/02/24 2000 04/03/24 0000 04/03/24 0407 04/03/24 0731 04/03/24 1123  GLUCAP 179* 97 108* 134* 106*   HbA1C: No results for input(s): HGBA1C in the last 72 hours. Urine analysis:    Component Value Date/Time    COLORURINE COLORLESS (A) 04/01/2024 0658   APPEARANCEUR CLEAR 04/01/2024 0658   LABSPEC 1.008 04/01/2024 0658   PHURINE 5.0 04/01/2024 0658   GLUCOSEU NEGATIVE 04/01/2024 0658   HGBUR MODERATE (A) 04/01/2024 0658   BILIRUBINUR NEGATIVE 04/01/2024 0658   KETONESUR NEGATIVE 04/01/2024 0658   PROTEINUR NEGATIVE 04/01/2024 0658   NITRITE NEGATIVE 04/01/2024 0658   LEUKOCYTESUR NEGATIVE 04/01/2024 0658   Sepsis Labs: @LABRCNTIP (procalcitonin:4,lacticidven:4) ) Recent Results (from the past 240 hours)  Blood Culture (routine x 2)     Status: None   Collection Time: 04/01/24 12:50 AM   Specimen: BLOOD RIGHT FOREARM  Result Value Ref Range Status   Specimen Description BLOOD RIGHT FOREARM  Final   Special Requests   Final    BOTTLES DRAWN AEROBIC AND ANAEROBIC Blood Culture adequate volume   Culture   Final    NO GROWTH 5 DAYS Performed at [facility], [address], [place], [place] [address]    Report Status 04/06/2024 FINAL  Final  Blood Culture (routine x 2)  Status: None   Collection Time: 04/01/24  1:39 AM   Specimen: Right Antecubital; Blood  Result Value Ref Range Status   Specimen Description RIGHT ANTECUBITAL  Final   Special Requests   Final    BOTTLES DRAWN AEROBIC AND ANAEROBIC Blood Culture adequate volume   Culture   Final    NO GROWTH 5 DAYS Performed at [facility], [address] [place], [place] [address]    Report Status 04/06/2024 FINAL  Final     Scheduled Meds: Continuous Infusions:  Procedures/Studies: DG Swallowing Func-Speech Pathology Result Date: 04/02/2024 Table formatting from the original result was not included. Modified Barium Swallow Study Patient Details Name: HUGHIE MELROY MRN: [id number] Date of Birth: 1945-08-05 Today's Date: 04/02/2024 HPI/PMH: HPI: COSTANTINO KOHLBECK is a 79 y.o. male with medical history significant for Alzheimer's dementia, history of agitation due to dementia, coronary artery disease, permanent atrial fibrillation  previously on Eliquis , complete heart block status post pacemaker placement, chronic thrombocytopenia, type 2 diabetes, hypertension, hyperlipidemia, GERD, BPH, who presents to the ER via EMS from SNF due to shortness of breath and generalized weakness. The patient was discharged yesterday after being admitted for sepsis secondary to pneumonia and acute hypoxic respiratory failure requiring 2 L nasal cannula. Staff at SNF reported the patient was more short of breath and lethargic than baseline today. EMS was activated. He received albuterol  nebs en route via EMS. In the ER, hypoxic with O2 saturation of 87% on 2 L nasal cannula. The patient is alert and confused in the setting of dementia. Frail-appearing with pressure wounds in heels. He is minimally verbal and unable to provide a history. A chest x-ray revealed increasing left basilar infiltrates and small effusion. Prominent cardiac shadow with stable pacemaker. Lactic acid 3.9. There was concern for sepsis. Subsequently, his CT chest abdomen pelvis without contrast revealed left lower lobe and lingular consolidation with patchy bilateral ground glass opacities more consistent with multifocal pneumonia. Compressive atelectasis versus additional pneumonia in the right lower lobe. Mild to moderate bilateral pleural effusions, right greater than left. Pt was seen by SLP service on Thursday and was discharged over the weekend. BSE completed and pt exhibiting overt s/s of aspiration and placed NPO. MBS ordered to determine safest diet and assess swallow function objectively. Clinical Impression: Clinical Impression: Pt presents with mild oropharyngeal dysphagia compounded by cognitive-based dysphagia c/b escape to lateral buccal cavity/floor of mouth and slow, prolonged mastication with solids.  Delayed initiation of tongue motion (oral holding) and delayed initiation of the swallow (typical for age) at the level of the valleculae throughout study.  Diminished  pharyngeal stripping wave and partial epiglottic inflection observed potentially d/t bulging PPW/?cervical osteophytes.  Decreased tongue base retraction resulting in diffuse residue within the vallecular space, pyriform sinuses and posterior pharyngeal wall intermittently during study.  It should be noted pt exhibited xerostomia prior to any po intake d/t NPO status which may have contributed to amount of residue within pharynx and other structures.  As the study progressed and compensatory strategies instilled including liquid wash, alternating solids/liquids and multiple effortful swallows, residue cleared to mild-insignificant amount within affected structures.  Partial distension/obstruction of flow observed in UES d/t prominent CP bar presence.  NO aspiration and/or penetration observed throughout study, but d/t pt's impaired cognition and deconditioning/dysphagia, he is at risk for aspiration if swallow strategies/A with meals/full supervision is not followed.  Recommend initiating a Dysphagia 1(puree)/thin liquid diet via tsp amounts with FULL supervision/A with meals.  ST will  f/u for dysphagia tx/education during remainder of acute stay.  ST should f/u at next venue of care. Factors that may increase risk of adverse event in presence of aspiration Noe & Lianne 2021): Factors that may increase risk of adverse event in presence of aspiration Noe & Lianne 2021): Poor general health and/or compromised immunity; Reduced cognitive function; Frail or deconditioned; Weak cough; Aspiration of thick, dense, and/or acidic materials Recommendations/Plan: Swallowing Evaluation Recommendations Swallowing Evaluation Recommendations Recommendations: PO diet PO Diet Recommendation: Dysphagia 1 (Pureed); Thin liquids (Level 0) Liquid Administration via: Spoon Medication Administration: Crushed with puree Supervision: Full assist for feeding; Full supervision/cueing for swallowing strategies Swallowing strategies   : Slow rate; Small bites/sips; Minimize environmental distractions; effortful swallow; Multiple dry swallows after each bite/sip; Follow solids with liquids Postural changes: Position pt fully upright for meals; Stay upright 30-60 min after meals Oral care recommendations: Oral care BID (2x/day); Staff/trained caregiver to provide oral care Treatment Plan Treatment Plan Treatment recommendations: Therapy as outlined in treatment plan below Follow-up recommendations: Skilled nursing-short term rehab (<3 hours/day) Functional status assessment: Patient has had a recent decline in their functional status and demonstrates the ability to make significant improvements in function in a reasonable and predictable amount of time. Treatment frequency: Min 2x/week Treatment duration: 1 week Interventions: Aspiration precaution training; Compensatory techniques; Patient/family education; Trials of upgraded texture/liquids; Diet toleration management by SLP Recommendations Recommendations for follow up therapy are one component of a multi-disciplinary discharge planning process, led by the attending physician.  Recommendations may be updated based on patient status, additional functional criteria and insurance authorization. Assessment: Orofacial Exam: Orofacial Exam Oral Cavity: Oral Hygiene: Xerostomia Oral Cavity - Dentition: Poor condition; Missing dentition Orofacial Anatomy: WFL Oral Motor/Sensory Function: Generalized oral weakness Anatomy: Anatomy: WFL Boluses Administered: Boluses Administered Boluses Administered: Thin liquids (Level 0); Mildly thick liquids (Level 2, nectar thick); Moderately thick liquids (Level 3, honey thick); Puree; Solid  Oral Impairment Domain: Oral Impairment Domain Lip Closure: No labial escape Tongue control during bolus hold: Escape to lateral buccal cavity/floor of mouth Bolus preparation/mastication: Slow prolonged chewing/mashing with complete recollection Bolus transport/lingual motion:  Delayed initiation of tongue motion (oral holding) Oral residue: Trace residue lining oral structures Location of oral residue : Tongue Initiation of pharyngeal swallow : Valleculae  Pharyngeal Impairment Domain: Pharyngeal Impairment Domain Soft palate elevation: No bolus between soft palate (SP)/pharyngeal wall (PW) Laryngeal elevation: Complete superior movement of thyroid  cartilage with complete approximation of arytenoids to epiglottic petiole Anterior hyoid excursion: Complete anterior movement Epiglottic movement: Partial inversion Laryngeal vestibule closure: Complete, no air/contrast in laryngeal vestibule Pharyngeal stripping wave : Present - diminished Pharyngeal contraction (A/P view only): N/A Pharyngoesophageal segment opening: Partial distention/partial duration, partial obstruction of flow Tongue base retraction: Narrow column of contrast or air between tongue base and PPW Pharyngeal residue: Collection of residue within or on pharyngeal structures Location of pharyngeal residue: Diffuse (>3 areas)  Esophageal Impairment Domain: Esophageal Impairment Domain Esophageal clearance upright position: Complete clearance, esophageal coating Pill: Pill Consistency administered: -- (n/a) Penetration/Aspiration Scale Score: Penetration/Aspiration Scale Score 1.  Material does not enter airway: Thin liquids (Level 0); Mildly thick liquids (Level 2, nectar thick); Moderately thick liquids (Level 3, honey thick); Puree; Solid Compensatory Strategies: Compensatory Strategies Compensatory strategies: Yes Effortful swallow: Effective Effective Effortful Swallow: Thin liquid (Level 0); Mildly thick liquid (Level 2, nectar thick); Puree Multiple swallows: Effective Effective Multiple Swallows: Thin liquid (Level 0); Mildly thick liquid (Level 2, nectar thick); Puree Liquid wash: Effective Effective Liquid  Wash: Thin liquid (Level 0); Mildly thick liquid (Level 2, nectar thick); Puree; Solid   General Information:  Caregiver present: No  Diet Prior to this Study: NPO   Temperature : Normal   Respiratory Status: WFL   Supplemental O2: Nasal cannula (3L)   History of Recent Intubation: No  Behavior/Cognition: Alert; Cooperative; Distractible; Requires cueing Self-Feeding Abilities: Dependent for feeding Baseline vocal quality/speech: Hypophonia/low volume; Dysphonic Volitional Cough: Able to elicit Volitional Swallow: Unable to elicit Exam Limitations: No limitations Goal Planning: Prognosis for improved oropharyngeal function: Good Barriers to Reach Goals: Cognitive deficits No data recorded Patient/Family Stated Goal: n/a Consulted and agree with results and recommendations: Patient; Pt unable/family or caregiver not available; Nurse; Physician Pain: Pain Assessment Pain Assessment: Faces Faces Pain Scale: 2 Breathing: 0 Negative Vocalization: 1 Facial Expression: 1 Body Language: 0 Consolability: 0 PAINAD Score: 0 Pain Location: generalized with movement Pain Descriptors / Indicators: Discomfort; Grimacing Pain Intervention(s): Limited activity within patient's tolerance; Repositioned End of Session: Start Time:SLP Start Time (ACUTE ONLY): 1014 Stop Time: SLP Stop Time (ACUTE ONLY): 1050 Time Calculation:SLP Time Calculation (min) (ACUTE ONLY): 36 min Charges: SLP Evaluations $ SLP Speech Visit: 1 Visit SLP Evaluations $BSS Swallow: 1 Procedure $MBS Swallow: 1 Procedure SLP visit diagnosis: SLP Visit Diagnosis: Dysphagia, oropharyngeal phase (R13.12) Past Medical History: Past Medical History: Diagnosis Date  Alzheimer's dementia (HCC)   Anxiety   Atrial fibrillation (HCC)   Permanent  Coronary artery disease   Mild nonobstructive 1/12  DM (diabetes mellitus) (HCC)   GERD (gastroesophageal reflux disease)   Gout   Hearing disorder, cochlear   Hiatal hernia   HLD (hyperlipidemia)   HTN (hypertension)   S/P AV nodal ablation   s/p SJM PPM; gen change 02-01-13 by Dr Kelsie  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  Warfarin  anticoagulation  Past Surgical History: Past Surgical History: Procedure Laterality Date  CATARACT EXTRACTION Right   COCHLEAR IMPLANT Right   HEMORRHOID BANDING    KNEE ARTHROSCOPY Right 1992  Dr Brenna   PACEMAKER GENERATOR CHANGE Bilateral 02/01/2013  Procedure: PACEMAKER GENERATOR CHANGE;  Surgeon: Lynwood JONETTA Kelsie, MD;  Location: [facility] CATH [facility];  Service: Cardiovascular;  Laterality: Bilateral;  PACEMAKER PLACEMENT  2002; 2014  SJM implanted by Dr Waddell with AV nodal ablation performed; gen change to [employer] SR RF by Dr Kelsie 02-01-13  RETINAL LASER PROCEDURE Left   SLT LASER APPLICATION Left 12/14/2015  Procedure: SLT LASER APPLICATION;  Surgeon: Dow JULIANNA Burke, MD;  Location: [facility];  Service: Ophthalmology;  Laterality: Left; Pat Adams,M.S.,CCC-SLP 04/02/2024, 11:23 AM  ECHOCARDIOGRAM COMPLETE Result Date: 04/01/2024    ECHOCARDIOGRAM REPORT   Patient Name:   KAROL LIENDO Date of Exam: 04/01/2024 Medical Rec #:  [id number]        Height:       65.0 in Accession #:    [id number]       Weight:       175.7 lb Date of Birth:  Dec 17, 1945        BSA:          1.872 m Patient Age:    78 years         BP:           158/69 mmHg Patient Gender: M                HR:           63 bpm. Exam Location:  [facility] Procedure: 2D Echo, Cardiac Doppler and Color  Doppler (Both Spectral and Color            Flow Doppler were utilized during procedure). Indications:    CHF-Acute Diastolic I50.31  History:        Patient has prior history of Echocardiogram examinations, most                 recent 05/09/2019. CAD, Pacemaker, Arrythmias:Atrial Fibrillation                 and Complete heart block (HCC); Risk Factors:Hypertension,                 Diabetes, Dyslipidemia and Non-Smoker. Alzheimer's dementia                 (HCC) (From Hx).  Sonographer:    Aida Pizza RCS Referring Phys: [id number] TERRY LOISE HURST  Sonographer Comments: Image acquisition challenging due to patient behavioral factors. IMPRESSIONS  1. Left ventricular  ejection fraction, by estimation, is 60 to 65%. The left ventricle has normal function. The left ventricle has no regional wall motion abnormalities. There is mild left ventricular hypertrophy. Left ventricular diastolic parameters are indeterminate.  2. Right ventricular systolic function is normal. The right ventricular size is mildly enlarged. There is normal pulmonary artery systolic pressure.  3. Left atrial size was severely dilated.  4. Right atrial size was severely dilated.  5. The mitral valve is abnormal. Mild to moderate mitral valve regurgitation. No evidence of mitral stenosis. Severe mitral annular calcification.  6. The aortic valve is tricuspid. There is mild calcification of the aortic valve. There is mild thickening of the aortic valve. Aortic valve regurgitation is not visualized. No aortic stenosis is present. Aortic valve mean gradient measures 6.0 mmHg.  7. Aortic dilatation noted. There is borderline dilatation of the aortic root, measuring 39 mm.  8. The inferior vena cava is normal in size with <50% respiratory variability, suggesting right atrial pressure of 8 mmHg. Comparison(s): No significant change from prior study. FINDINGS  Left Ventricle: Left ventricular ejection fraction, by estimation, is 60 to 65%. The left ventricle has normal function. The left ventricle has no regional wall motion abnormalities. Strain was performed and the global longitudinal strain is indeterminate. The left ventricular internal cavity size was normal in size. There is mild left ventricular hypertrophy. Left ventricular diastolic parameters are indeterminate. Right Ventricle: The right ventricular size is mildly enlarged. No increase in right ventricular wall thickness. Right ventricular systolic function is normal. There is normal pulmonary artery systolic pressure. The tricuspid regurgitant velocity is 2.34  m/s, and with an assumed right atrial pressure of 8 mmHg, the estimated right ventricular systolic  pressure is 29.9 mmHg. Left Atrium: Left atrial size was severely dilated. Right Atrium: Right atrial size was severely dilated. Pericardium: There is no evidence of pericardial effusion. Mitral Valve: The mitral valve is abnormal. Severe mitral annular calcification. Mild to moderate mitral valve regurgitation. No evidence of mitral valve stenosis. Tricuspid Valve: The tricuspid valve is normal in structure. Tricuspid valve regurgitation is mild . No evidence of tricuspid stenosis. Aortic Valve: The aortic valve is tricuspid. There is mild calcification of the aortic valve. There is mild thickening of the aortic valve. Aortic valve regurgitation is not visualized. No aortic stenosis is present. Aortic valve mean gradient measures 6.0 mmHg. Aortic valve peak gradient measures 11.3 mmHg. Aortic valve area, by VTI measures 1.90 cm. Pulmonic Valve: The pulmonic valve was normal in structure. Pulmonic valve regurgitation is trivial. No evidence  of pulmonic stenosis. Aorta: Aortic dilatation noted. There is borderline dilatation of the aortic root, measuring 39 mm. Venous: The inferior vena cava is normal in size with less than 50% respiratory variability, suggesting right atrial pressure of 8 mmHg. IAS/Shunts: No atrial level shunt detected by color flow Doppler. Additional Comments: 3D was performed not requiring image post processing on an independent workstation and was indeterminate.  LEFT VENTRICLE PLAX 2D LVIDd:         4.80 cm LVIDs:         3.00 cm LV PW:         1.20 cm LV IVS:        1.20 cm LVOT diam:     2.10 cm LV SV:         56 LV SV Index:   30 LVOT Area:     3.46 cm  RIGHT VENTRICLE RV S prime:     14.30 cm/s TAPSE (M-mode): 2.7 cm LEFT ATRIUM              Index        RIGHT ATRIUM           Index LA diam:        5.90 cm  3.15 cm/m   RA Area:     38.35 cm LA Vol (A2C):   138.5 ml 73.98 ml/m  RA Volume:   150.00 ml 80.12 ml/m LA Vol (A4C):   182.0 ml 97.21 ml/m LA Biplane Vol: 165.0 ml 88.13 ml/m   AORTIC VALVE AV Area (Vmax):    1.64 cm AV Area (Vmean):   1.83 cm AV Area (VTI):     1.90 cm AV Vmax:           168.00 cm/s AV Vmean:          106.000 cm/s AV VTI:            0.294 m AV Peak Grad:      11.3 mmHg AV Mean Grad:      6.0 mmHg LVOT Vmax:         79.40 cm/s LVOT Vmean:        56.100 cm/s LVOT VTI:          0.161 m LVOT/AV VTI ratio: 0.55  AORTA Ao Root diam: 3.90 cm MITRAL VALVE                TRICUSPID VALVE MV Area (PHT): 3.27 cm     TR Peak grad:   21.9 mmHg MV Decel Time: 232 msec     TR Vmax:        234.00 cm/s MR Peak grad: 119.7 mmHg MR Mean grad: 75.0 mmHg     SHUNTS MR Vmax:      547.00 cm/s   Systemic VTI:  0.16 m MR Vmean:     401.0 cm/s    Systemic Diam: 2.10 cm MV E velocity: 107.00 cm/s MV A velocity: 33.30 cm/s MV E/A ratio:  3.21 Vishnu Priya Mallipeddi Electronically signed by Diannah Late Mallipeddi Signature Date/Time: 04/01/2024/2:10:59 PM    Final    CT CHEST ABDOMEN PELVIS WO CONTRAST Result Date: 04/01/2024 EXAM: CT CHEST, ABDOMEN AND PELVIS WITHOUT CONTRAST 04/01/2024 01:25:10 AM TECHNIQUE: CT of the chest, abdomen and pelvis was performed without the administration of intravenous contrast. Multiplanar reformatted images are provided for review. Automated exposure control, iterative reconstruction, and/or weight based adjustment of the mA/kV was utilized to reduce the radiation dose to as low as reasonably  achievable. COMPARISON: 02/07/2023. CLINICAL HISTORY: Sepsis. FINDINGS: CHEST: MEDIASTINUM AND LYMPH NODES: Cardiomegaly. Pacer wires in the right heart. Coronary artery aortic atherosclerosis. The central airways are clear. No mediastinal, hilar or axillary lymphadenopathy. LUNGS AND PLEURA: Mild to moderate bilateral pleural effusions, right greater than left. Consolidation with air bronchograms in the left lower lobe and posterior lingula concerning for pneumonia. Compressive atelectasis or pneumonia in the right lower lobe. Patchy ground glass airspace opacities also  likely related to pneumonia. No pneumothorax. ABDOMEN AND PELVIS: LIVER: Mild nodular contours of the liver suggest cirrhosis. GALLBLADDER AND BILE DUCTS: High-density material within the gallbladder could reflect small stones or contrast material. No biliary ductal dilatation. SPLEEN: The spleen is mildly enlarged at 13 cm craniocaudal length. PANCREAS: No acute abnormality. ADRENAL GLANDS: No acute abnormality. KIDNEYS, URETERS AND BLADDER: 1.6 cm stone in the upper pole of the right kidney. No hydronephrosis. No perinephric or periureteral stranding. Urinary bladder is unremarkable. GI AND BOWEL: The stomach is decompressed but appears to be thick-walled. This could be related to portal gastropathy or gastritis. Sigmoid diverticulosis. No active diverticulitis. Moderate stool burden throughout the colon. There is no bowel obstruction. REPRODUCTIVE ORGANS: Prostate enlargement. PERITONEUM AND RETROPERITONEUM: Moderate free fluid in the abdomen and pelvis. No free air. VASCULATURE: Aorta is normal in caliber. Aortoiliac catheter sclerosis. ABDOMINAL AND PELVIS LYMPH NODES: No lymphadenopathy. BONES AND SOFT TISSUES: Stable severe compression fracture at L1. Diffuse osteopenia. No focal soft tissue abnormality. IMPRESSION: 1. Left lower lobe and lingular consolidation with patchy bilateral ground-glass opacities, most consistent with multifocal pneumonia. 2. Compressive atelectasis versus additional pneumonia in the right lower lobe. 3. Mild to moderate bilateral pleural effusions, right greater than left. 4. Cirrhotic liver morphology with moderate abdominopelvic ascites and splenomegaly . 5. Gastric wall thickening may be related to portal gastropathy or gastritis . 6. Right upper pole nephrolithiasis . 7. Coronary artery disease, aortic atherosclerosis. 8. Sigmoid diverticulosis. Moderate stool burden in the colon. Electronically signed by: Franky Crease MD 04/01/2024 01:36 AM EST RP Workstation: [id number]   DG  Chest Port 1 View Result Date: 04/01/2024 EXAM: 1 VIEW(S) XRAY OF THE CHEST 04/01/2024 01:00:00 AM COMPARISON: Comparison from 03/24/2024. CLINICAL HISTORY: Increased lethargy and shortness of breath. FINDINGS: LUNGS AND PLEURA: Increasing left basilar infiltrate. Small left pleural effusion. No pneumothorax. HEART AND MEDIASTINUM: The cardiac shadow is prominent. The pacemaker is again seen and stable. BONES AND SOFT TISSUES: No bony abnormality is noted. IMPRESSION: 1. Increasing left basilar infiltrate and small effusion. 2. Prominent cardiac shadow with stable pacemaker. Electronically signed by: Oneil Devonshire MD 04/01/2024 01:04 AM EST RP Workstation: [id number]   DG Chest Port 1 View Result Date: 03/24/2024 CLINICAL DATA:  Respiratory distress. EXAM: PORTABLE CHEST 1 VIEW COMPARISON:  03/03/2024 FINDINGS: The cardio pericardial silhouette is enlarged. Retrocardiac atelectasis or infiltrate noted. Right lung clear. Single lead left-sided permanent pacemaker again noted. Telemetry leads overlie the chest. IMPRESSION: Retrocardiac atelectasis or infiltrate. Electronically Signed   By: Camellia Candle M.D.   On: 03/24/2024 07:22    Alm Schneider, DO  [facility]  If 7PM-7AM, please contact night-coverage [email or web address] Password [facility] 04/06/2024, 7:54 PM   LOS: 3 days   "

## 2024-04-07 DIAGNOSIS — Z515 Encounter for palliative care: Secondary | ICD-10-CM | POA: Diagnosis not present

## 2024-04-07 NOTE — Plan of Care (Signed)
   Problem: Activity: Goal: Risk for activity intolerance will decrease Outcome: Progressing   Problem: Coping: Goal: Level of anxiety will decrease Outcome: Progressing

## 2024-04-07 NOTE — Progress Notes (Signed)
 "          PROGRESS NOTE  Jay Campbell FMW:[id number] DOB: 10-15-1945 DOA: 04/03/2024 PCP: [name], MD  Brief History:  79 y.o. male with medical history significant for Alzheimer's dementia, history of agitation due to dementia, coronary artery disease, permanent atrial fibrillation previously on Eliquis , complete heart block status post pacemaker placement, chronic thrombocytopenia, type 2 diabetes, hypertension, hyperlipidemia, GERD, BPH, who presents to the ER via EMS from SNF due to shortness of breath and generalized weakness.  The patient was discharged yesterday after being admitted for sepsis secondary to pneumonia and acute hypoxic respiratory failure requiring 2 L nasal cannula.  Staff at SNF reported the patient was more short of breath and lethargic than baseline today.  EMS was activated.  He received albuterol  nebs en route via EMS.   In the ER, hypoxic with O2 saturation of 87% on 2 L nasal cannula.  The patient is alert and confused in the setting of dementia.  Frail-appearing with pressure wounds in heels.  He is minimally verbal and unable to provide a history.     A chest x-ray revealed increasing left basilar infiltrates and small effusion.  Prominent cardiac shadow with stable pacemaker.  Lactic acid 3.9.  There was concern for sepsis.  Subsequently, his CT chest abdomen pelvis without contrast revealed left lower lobe and lingular consolidation with patchy bilateral ground glass opacities more consistent with multifocal pneumonia.  Compressive atelectasis versus additional pneumonia in the right lower lobe.  Mild to moderate bilateral pleural effusions, right greater than left.  Cirrhotic liver morphology with moderate abdominal pelvic ascites and splenomegaly.  Gastric wall thickening may be related to portal gastropathy or gastritis.  Right upper lobe nephrolithiasis.  Coronary artery disease, aortic atherosclerosis.  Sigmoid diverticulosis.  Moderate stool burden in the  colon.   The patient received 1 dose of cefepime  and azithromycin  in the ER.  TR[facility], hospitalist service, was asked to admit for worsening pneumonia.  Subsequently, the patient's antibiotics were discontinued as the patient received a full course of antibiotics during his last hospitalization from 03/24/2024 to 03/31/2024.  He remained afebrile and hemodynamically stable. Unfortunately, the patient's mental status did not improve much.  He was noted to be dehydrated with a free water  deficit.  Sodium went up to 155.  He was started on IV hypotonic fluid.  Although his sodium did gradually improve, the patient remained somnolent with minimal to no p.o. intake.  He continued to have dysphagia.  The patient was seen by speech therapy.  MBS was performed and did not reveal aspiration; however, patient was felt to be at continued risk for aspiration secondary to his impaired cognition and deconditioning.  A dysphagia 1 diet was ultimately recommended.  Palliative medicine was consulted.  After meetings with the patient's family, the patient's son was ultimately agreeable to transition to full comfort measures after the patient was accepted to residential hospice.  A bed was not available at [facility].  The patient was admitted to the hospital for hospital hospice care until bed is available at [facility] house.  Palliative medicine continue to follow.  They spoke with patient's son and verified desire to transition to full comfort measures.   Assessment/Plan: Adult Failure to Thrive -- unfortunately, despite maximal medical interventions and recent prolonged hospitalization of 7 days he has bounced back into the hospital within 24 hours. -- Patient has significant dysphagia and unable to eat/drink enough to maintain body requirements for fluids and nutrition --  Admit to medicine consulted -- continue DNR/DNI orders present on admission -Ultimately, patient's family agreed to transition to full comfort measures  after the patient was accepted to residential hospice. - Patient now has been transitioned to general inpatient hospice and for comfort measures have been instituted. - added oral morphine  solution   Multifocal pneumonia, seen on CT scan -- Pneumonia was fully treated during previous hospitalization -- DC antibiotics -- Patient remained afebrile hemodynamically stable - Patient now has been transitioned to general inpatient hospice and for comfort measures have been instituted.   Severe Dysphagia -- SLP evaluation requested -- MBS done--no definitive aspiration -- Patient continued to remain high risk for aspiration -Dysphagia 1 diet ordered>> pleasure feeding -Oral intake remains very poor - Patient now has been transitioned to general inpatient hospice and for comfort measures have been instituted.   Bilateral mild to moderate pleural effusions, seen on CT scan -- Closely monitor volume status -- Secondary to hepatic hydrothorax -Cirrhotic liver noted on CT abdomen and pelvis   Chronic hypoxic respiratory failure secondary to the above -- Recently started on 2 L O2 supplementation during previous admission for pneumonia. -- Unfortunately supplemental oxygen  wasn't provided at facility -Stable on 2 to 3 L presently. - Patient now has been transitioned to general inpatient hospice and for comfort measures have been instituted.   Lactic acidosis -- Secondary to Hypoxia, hypoperfusion, volume depletion -- Lactic acid is trending down  -- Follow peripheral blood cultures x 2. -- IV fluid hydration  -- Sepsis has been ruled out   Elevated troponin, suspect demand ischemia secondary to the above History of coronary artery disease Aortic atherosclerosis High-sensitivity troponin 23, 26. No evidence of acute ischemia on twelve-lead EKG No chest pain reported. Monitor on telemetry.   Chronic HFpEF Last 2D echo done on 05/09/2019 revealed LVEF 60 to 65% Presented with dyspnea,  cardiomegaly, and bilateral pleural effusions -04/01/2024 echo EF 60 to 65%, no WMA, normal RVF - Patient now has been transitioned to general inpatient hospice and for comfort measures have been instituted.   Hypernatremia Speech therapy is consulted to rule out dysphagia Hypotonic IV fluid to correct free water  deficit --Patient now transition to full comfort measures   Hypokalemia -initially repleted -now on comfort measures   Gastric wall thickening, may be related to portal gastropathy or gastritis, seen on CT scan. IV PPI Protonix  40 mg daily initially No reported abdominal pain or GI bleed Avoid NSAIDs Patient may transition to full comfort measures   Generalized weakness He is chronically debilitated and this is unchanged Palliative consultation requested    Type 2 diabetes with hyperglycemia Last hemoglobin A1c 7.3 on 03/24/2024   Hypertension Resume home Lopressor  Closely monitor vital signs.   Alzheimer's dementia with history of agitation Resume home regimen. Aricept  held due to transient bradycardia. -now on comfort measures care   Permanent atrial fibrillation, no longer on Eliquis  due to chronic thrombocytopenia Currently rate controlled --- Patient now has been transitioned to general inpatient hospice and for comfort measures have been instituted.             Family Communication:   no Family at bedside   Consultants:  palliative   Code Status:  FULL COMFORT   DVT Prophylaxis:  FULL COMFORT     Procedures: As Listed in Progress Note Above   Antibiotics: none           Subjective: Patient is resting comfortably.  No reports of vomiting, diarrhea, respiratory distress, uncontrolled  pain.  Objective: Vitals:   04/05/24 2013 04/06/24 1445 04/06/24 1955 04/07/24 1425  BP: 97/69 137/63 138/60 (!) 92/56  Pulse: 64 63 64 67  Resp: 16 20 20 16   Temp: 97.9 F (36.6 C) 98 F (36.7 C) (!) 97.3 F (36.3 C) 98.9 F (37.2 C)  TempSrc:  Oral Oral Oral Axillary  SpO2:  96%  94%   No intake or output data in the 24 hours ending 04/07/24 1708 Weight change:  Exam:  General:  Pt is somnolent, does not follow commands appropriately, not in acute distress HEENT: No icterus, No thrush, No neck mass, [place]/AT Cardiovascular: RRR, S1/S2, no rubs, no gallops Respiratory: Bibasilar rales.  No wheezing Abdomen: Soft/+BS, non tender, non distended, no guarding Extremities: No edema, No lymphangitis, No petechiae, No rashes, no synovitis   Data Reviewed: I have personally reviewed following labs and imaging studies Basic Metabolic Panel: Recent Labs  Lab 04/01/24 0050 04/01/24 0709 04/01/24 1150 04/02/24 0444 04/03/24 0450  NA 155* 155* 154* 150* 146*  K 3.8 3.6 3.3* 3.6 3.3*  CL 121* 120* 121* 119* 114*  CO2 22 24 22  20* 22  GLUCOSE 199* 224* 213* 101* 119*  BUN 52* 49* 48* 41* 35*  CREATININE 1.46* 1.35* 1.28* 1.17 0.99  CALCIUM 8.5* 8.3* 8.0* 7.7* 7.2*  MG 2.1  --   --  1.8  --   PHOS  --   --   --  2.9  --    Liver Function Tests: Recent Labs  Lab 04/01/24 0050  AST 20  ALT 15  ALKPHOS 101  BILITOT 0.8  PROT 6.6  ALBUMIN 3.0*   No results for input(s): LIPASE, AMYLASE in the last 168 hours. No results for input(s): AMMONIA in the last 168 hours. Coagulation Profile: No results for input(s): INR, PROTIME in the last 168 hours. CBC: Recent Labs  Lab 04/01/24 0050 04/02/24 0444  WBC 6.9 5.2  NEUTROABS 5.8 4.3  HGB 11.1* 10.7*  HCT 34.3* 33.6*  MCV 106.9* 108.4*  PLT 66* 43*   Cardiac Enzymes: No results for input(s): CKTOTAL, CKMB, CKMBINDEX, TROPONINI in the last 168 hours. BNP: Invalid input(s): POCBNP CBG: Recent Labs  Lab 04/02/24 2000 04/03/24 0000 04/03/24 0407 04/03/24 0731 04/03/24 1123  GLUCAP 179* 97 108* 134* 106*   HbA1C: No results for input(s): HGBA1C in the last 72 hours. Urine analysis:    Component Value Date/Time   COLORURINE COLORLESS (A)  04/01/2024 0658   APPEARANCEUR CLEAR 04/01/2024 0658   LABSPEC 1.008 04/01/2024 0658   PHURINE 5.0 04/01/2024 0658   GLUCOSEU NEGATIVE 04/01/2024 0658   HGBUR MODERATE (A) 04/01/2024 0658   BILIRUBINUR NEGATIVE 04/01/2024 0658   KETONESUR NEGATIVE 04/01/2024 0658   PROTEINUR NEGATIVE 04/01/2024 0658   NITRITE NEGATIVE 04/01/2024 0658   LEUKOCYTESUR NEGATIVE 04/01/2024 0658   Sepsis Labs: @LABRCNTIP (procalcitonin:4,lacticidven:4) ) Recent Results (from the past 240 hours)  Blood Culture (routine x 2)     Status: None   Collection Time: 04/01/24 12:50 AM   Specimen: BLOOD RIGHT FOREARM  Result Value Ref Range Status   Specimen Description BLOOD RIGHT FOREARM  Final   Special Requests   Final    BOTTLES DRAWN AEROBIC AND ANAEROBIC Blood Culture adequate volume   Culture   Final    NO GROWTH 5 DAYS Performed at [facility], [address], [place], [place] [address]    Report Status 04/06/2024 FINAL  Final  Blood Culture (routine x 2)     Status:  None   Collection Time: 04/01/24  1:39 AM   Specimen: Right Antecubital; Blood  Result Value Ref Range Status   Specimen Description RIGHT ANTECUBITAL  Final   Special Requests   Final    BOTTLES DRAWN AEROBIC AND ANAEROBIC Blood Culture adequate volume   Culture   Final    NO GROWTH 5 DAYS Performed at [facility], [address] [place] [address] [place], [place] [address]    Report Status 04/06/2024 FINAL  Final     Scheduled Meds: Continuous Infusions:  Procedures/Studies: DG Swallowing Func-Speech Pathology Result Date: 04/02/2024 Table formatting from the original result was not included. Modified Barium Swallow Study Patient Details Name: Jay Campbell MRN: [id number] Date of Birth: [birth year] Today's Date: 04/02/2024 HPI/PMH: HPI: Jay Campbell is a 79 y.o. male with medical history significant for Alzheimer's dementia, history of agitation due to dementia, coronary artery disease, permanent atrial fibrillation previously on Eliquis ,  complete heart block status post pacemaker placement, chronic thrombocytopenia, type 2 diabetes, hypertension, hyperlipidemia, GERD, BPH, who presents to the ER via EMS from SNF due to shortness of breath and generalized weakness. The patient was discharged yesterday after being admitted for sepsis secondary to pneumonia and acute hypoxic respiratory failure requiring 2 L nasal cannula. Staff at SNF reported the patient was more short of breath and lethargic than baseline today. EMS was activated. He received albuterol  nebs en route via EMS. In the ER, hypoxic with O2 saturation of 87% on 2 L nasal cannula. The patient is alert and confused in the setting of dementia. Frail-appearing with pressure wounds in heels. He is minimally verbal and unable to provide a history. A chest x-ray revealed increasing left basilar infiltrates and small effusion. Prominent cardiac shadow with stable pacemaker. Lactic acid 3.9. There was concern for sepsis. Subsequently, his CT chest abdomen pelvis without contrast revealed left lower lobe and lingular consolidation with patchy bilateral ground glass opacities more consistent with multifocal pneumonia. Compressive atelectasis versus additional pneumonia in the right lower lobe. Mild to moderate bilateral pleural effusions, right greater than left. Pt was seen by SLP service on Thursday and was discharged over the weekend. BSE completed and pt exhibiting overt s/s of aspiration and placed NPO. MBS ordered to determine safest diet and assess swallow function objectively. Clinical Impression: Clinical Impression: Pt presents with mild oropharyngeal dysphagia compounded by cognitive-based dysphagia c/b escape to lateral buccal cavity/floor of mouth and slow, prolonged mastication with solids.  Delayed initiation of tongue motion (oral holding) and delayed initiation of the swallow (typical for age) at the level of the valleculae throughout study.  Diminished pharyngeal stripping wave and  partial epiglottic inflection observed potentially d/t bulging PPW/?cervical osteophytes.  Decreased tongue base retraction resulting in diffuse residue within the vallecular space, pyriform sinuses and posterior pharyngeal wall intermittently during study.  It should be noted pt exhibited xerostomia prior to any po intake d/t NPO status which may have contributed to amount of residue within pharynx and other structures.  As the study progressed and compensatory strategies instilled including liquid wash, alternating solids/liquids and multiple effortful swallows, residue cleared to mild-insignificant amount within affected structures.  Partial distension/obstruction of flow observed in UES d/t prominent CP bar presence.  NO aspiration and/or penetration observed throughout study, but d/t pt's impaired cognition and deconditioning/dysphagia, he is at risk for aspiration if swallow strategies/A with meals/full supervision is not followed.  Recommend initiating a Dysphagia 1(puree)/thin liquid diet via tsp amounts with FULL supervision/A with meals.  ST will f/u  for dysphagia tx/education during remainder of acute stay.  ST should f/u at next venue of care. Factors that may increase risk of adverse event in presence of aspiration Noe & Lianne 2021): Factors that may increase risk of adverse event in presence of aspiration Noe & Lianne 2021): Poor general health and/or compromised immunity; Reduced cognitive function; Frail or deconditioned; Weak cough; Aspiration of thick, dense, and/or acidic materials Recommendations/Plan: Swallowing Evaluation Recommendations Swallowing Evaluation Recommendations Recommendations: PO diet PO Diet Recommendation: Dysphagia 1 (Pureed); Thin liquids (Level 0) Liquid Administration via: Spoon Medication Administration: Crushed with puree Supervision: Full assist for feeding; Full supervision/cueing for swallowing strategies Swallowing strategies  : Slow rate; Small bites/sips;  Minimize environmental distractions; effortful swallow; Multiple dry swallows after each bite/sip; Follow solids with liquids Postural changes: Position pt fully upright for meals; Stay upright 30-60 min after meals Oral care recommendations: Oral care BID (2x/day); Staff/trained caregiver to provide oral care Treatment Plan Treatment Plan Treatment recommendations: Therapy as outlined in treatment plan below Follow-up recommendations: Skilled nursing-short term rehab (<3 hours/day) Functional status assessment: Patient has had a recent decline in their functional status and demonstrates the ability to make significant improvements in function in a reasonable and predictable amount of time. Treatment frequency: Min 2x/week Treatment duration: 1 week Interventions: Aspiration precaution training; Compensatory techniques; Patient/family education; Trials of upgraded texture/liquids; Diet toleration management by SLP Recommendations Recommendations for follow up therapy are one component of a multi-disciplinary discharge planning process, led by the attending physician.  Recommendations may be updated based on patient status, additional functional criteria and insurance authorization. Assessment: Orofacial Exam: Orofacial Exam Oral Cavity: Oral Hygiene: Xerostomia Oral Cavity - Dentition: Poor condition; Missing dentition Orofacial Anatomy: WFL Oral Motor/Sensory Function: Generalized oral weakness Anatomy: Anatomy: WFL Boluses Administered: Boluses Administered Boluses Administered: Thin liquids (Level 0); Mildly thick liquids (Level 2, nectar thick); Moderately thick liquids (Level 3, honey thick); Puree; Solid  Oral Impairment Domain: Oral Impairment Domain Lip Closure: No labial escape Tongue control during bolus hold: Escape to lateral buccal cavity/floor of mouth Bolus preparation/mastication: Slow prolonged chewing/mashing with complete recollection Bolus transport/lingual motion: Delayed initiation of tongue  motion (oral holding) Oral residue: Trace residue lining oral structures Location of oral residue : Tongue Initiation of pharyngeal swallow : Valleculae  Pharyngeal Impairment Domain: Pharyngeal Impairment Domain Soft palate elevation: No bolus between soft palate (SP)/pharyngeal wall (PW) Laryngeal elevation: Complete superior movement of thyroid  cartilage with complete approximation of arytenoids to epiglottic petiole Anterior hyoid excursion: Complete anterior movement Epiglottic movement: Partial inversion Laryngeal vestibule closure: Complete, no air/contrast in laryngeal vestibule Pharyngeal stripping wave : Present - diminished Pharyngeal contraction (A/P view only): N/A Pharyngoesophageal segment opening: Partial distention/partial duration, partial obstruction of flow Tongue base retraction: Narrow column of contrast or air between tongue base and PPW Pharyngeal residue: Collection of residue within or on pharyngeal structures Location of pharyngeal residue: Diffuse (>3 areas)  Esophageal Impairment Domain: Esophageal Impairment Domain Esophageal clearance upright position: Complete clearance, esophageal coating Pill: Pill Consistency administered: -- (n/a) Penetration/Aspiration Scale Score: Penetration/Aspiration Scale Score 1.  Material does not enter airway: Thin liquids (Level 0); Mildly thick liquids (Level 2, nectar thick); Moderately thick liquids (Level 3, honey thick); Puree; Solid Compensatory Strategies: Compensatory Strategies Compensatory strategies: Yes Effortful swallow: Effective Effective Effortful Swallow: Thin liquid (Level 0); Mildly thick liquid (Level 2, nectar thick); Puree Multiple swallows: Effective Effective Multiple Swallows: Thin liquid (Level 0); Mildly thick liquid (Level 2, nectar thick); Puree Liquid wash: Effective Effective Liquid Wash:  Thin liquid (Level 0); Mildly thick liquid (Level 2, nectar thick); Puree; Solid   General Information: Caregiver present: No  Diet Prior  to this Study: NPO   Temperature : Normal   Respiratory Status: WFL   Supplemental O2: Nasal cannula (3L)   History of Recent Intubation: No  Behavior/Cognition: Alert; Cooperative; Distractible; Requires cueing Self-Feeding Abilities: Dependent for feeding Baseline vocal quality/speech: Hypophonia/low volume; Dysphonic Volitional Cough: Able to elicit Volitional Swallow: Unable to elicit Exam Limitations: No limitations Goal Planning: Prognosis for improved oropharyngeal function: Good Barriers to Reach Goals: Cognitive deficits No data recorded Patient/Family Stated Goal: n/a Consulted and agree with results and recommendations: Patient; Pt unable/family or caregiver not available; Nurse; Physician Pain: Pain Assessment Pain Assessment: Faces Faces Pain Scale: 2 Breathing: 0 Negative Vocalization: 1 Facial Expression: 1 Body Language: 0 Consolability: 0 PAINAD Score: 0 Pain Location: generalized with movement Pain Descriptors / Indicators: Discomfort; Grimacing Pain Intervention(s): Limited activity within patient's tolerance; Repositioned End of Session: Start Time:SLP Start Time (ACUTE ONLY): 1014 Stop Time: SLP Stop Time (ACUTE ONLY): 1050 Time Calculation:SLP Time Calculation (min) (ACUTE ONLY): 36 min Charges: SLP Evaluations $ SLP Speech Visit: 1 Visit SLP Evaluations $BSS Swallow: 1 Procedure $MBS Swallow: 1 Procedure SLP visit diagnosis: SLP Visit Diagnosis: Dysphagia, oropharyngeal phase (R13.12) Past Medical History: Past Medical History: Diagnosis Date  Alzheimer's dementia (HCC)   Anxiety   Atrial fibrillation (HCC)   Permanent  Coronary artery disease   Mild nonobstructive 1/12  DM (diabetes mellitus) (HCC)   GERD (gastroesophageal reflux disease)   Gout   Hearing disorder, cochlear   Hiatal hernia   HLD (hyperlipidemia)   HTN (hypertension)   S/P AV nodal ablation   s/p SJM PPM; gen change 02-01-13 by Dr Kelsie  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  Warfarin anticoagulation  Past Surgical History:  Past Surgical History: Procedure Laterality Date  CATARACT EXTRACTION Right   COCHLEAR IMPLANT Right   HEMORRHOID BANDING    KNEE ARTHROSCOPY Right 1992  Dr Brenna   PACEMAKER GENERATOR CHANGE Bilateral 02/01/2013  Procedure: PACEMAKER GENERATOR CHANGE;  Surgeon: Lynwood JONETTA Kelsie, MD;  Location: [facility]ATH [facility];  Service: Cardiovascular;  Laterality: Bilateral;  PACEMAKER PLACEMENT  2002; 2014  SJM implanted by Dr Waddell with AV nodal ablation performed; gen change to [employer] SR RF by Dr Kelsie 02-01-13  RETINAL LASER PROCEDURE Left   SLT LASER APPLICATION Left 12/14/2015  Procedure: SLT LASER APPLICATION;  Surgeon: Dow JULIANNA Burke, MD;  Location: [facility];  Service: Ophthalmology;  Laterality: Left; Pat Adams,M.S.,CCC-SLP 04/02/2024, 11:23 AM  ECHOCARDIOGRAM COMPLETE Result Date: 04/01/2024    ECHOCARDIOGRAM REPORT   Patient Name:   Jay Campbell Date of Exam: 04/01/2024 Medical Rec #:  [id number]        Height:       65.0 in Accession #:    [id number]       Weight:       175.7 lb Date of Birth:  1946-02-06        BSA:          1.872 m Patient Age:    78 years         BP:           158/69 mmHg Patient Gender: M                HR:           63 bpm. Exam Location:  [facility] Procedure: 2D Echo, Cardiac Doppler and Color Doppler (  Both Spectral and Color            Flow Doppler were utilized during procedure). Indications:    CHF-Acute Diastolic I50.31  History:        Patient has prior history of Echocardiogram examinations, most                 recent 05/09/2019. CAD, Pacemaker, Arrythmias:Atrial Fibrillation                 and Complete heart block (HCC); Risk Factors:Hypertension,                 Diabetes, Dyslipidemia and Non-Smoker. Alzheimer's dementia                 (HCC) (From Hx).  Sonographer:    Aida Pizza RCS Referring Phys: [id number] TERRY LOISE HURST  Sonographer Comments: Image acquisition challenging due to patient behavioral factors. IMPRESSIONS  1. Left ventricular ejection fraction, by estimation, is 60 to  65%. The left ventricle has normal function. The left ventricle has no regional wall motion abnormalities. There is mild left ventricular hypertrophy. Left ventricular diastolic parameters are indeterminate.  2. Right ventricular systolic function is normal. The right ventricular size is mildly enlarged. There is normal pulmonary artery systolic pressure.  3. Left atrial size was severely dilated.  4. Right atrial size was severely dilated.  5. The mitral valve is abnormal. Mild to moderate mitral valve regurgitation. No evidence of mitral stenosis. Severe mitral annular calcification.  6. The aortic valve is tricuspid. There is mild calcification of the aortic valve. There is mild thickening of the aortic valve. Aortic valve regurgitation is not visualized. No aortic stenosis is present. Aortic valve mean gradient measures 6.0 mmHg.  7. Aortic dilatation noted. There is borderline dilatation of the aortic root, measuring 39 mm.  8. The inferior vena cava is normal in size with <50% respiratory variability, suggesting right atrial pressure of 8 mmHg. Comparison(s): No significant change from prior study. FINDINGS  Left Ventricle: Left ventricular ejection fraction, by estimation, is 60 to 65%. The left ventricle has normal function. The left ventricle has no regional wall motion abnormalities. Strain was performed and the global longitudinal strain is indeterminate. The left ventricular internal cavity size was normal in size. There is mild left ventricular hypertrophy. Left ventricular diastolic parameters are indeterminate. Right Ventricle: The right ventricular size is mildly enlarged. No increase in right ventricular wall thickness. Right ventricular systolic function is normal. There is normal pulmonary artery systolic pressure. The tricuspid regurgitant velocity is 2.34  m/s, and with an assumed right atrial pressure of 8 mmHg, the estimated right ventricular systolic pressure is 29.9 mmHg. Left Atrium: Left  atrial size was severely dilated. Right Atrium: Right atrial size was severely dilated. Pericardium: There is no evidence of pericardial effusion. Mitral Valve: The mitral valve is abnormal. Severe mitral annular calcification. Mild to moderate mitral valve regurgitation. No evidence of mitral valve stenosis. Tricuspid Valve: The tricuspid valve is normal in structure. Tricuspid valve regurgitation is mild . No evidence of tricuspid stenosis. Aortic Valve: The aortic valve is tricuspid. There is mild calcification of the aortic valve. There is mild thickening of the aortic valve. Aortic valve regurgitation is not visualized. No aortic stenosis is present. Aortic valve mean gradient measures 6.0 mmHg. Aortic valve peak gradient measures 11.3 mmHg. Aortic valve area, by VTI measures 1.90 cm. Pulmonic Valve: The pulmonic valve was normal in structure. Pulmonic valve regurgitation is trivial. No evidence of  pulmonic stenosis. Aorta: Aortic dilatation noted. There is borderline dilatation of the aortic root, measuring 39 mm. Venous: The inferior vena cava is normal in size with less than 50% respiratory variability, suggesting right atrial pressure of 8 mmHg. IAS/Shunts: No atrial level shunt detected by color flow Doppler. Additional Comments: 3D was performed not requiring image post processing on an independent workstation and was indeterminate.  LEFT VENTRICLE PLAX 2D LVIDd:         4.80 cm LVIDs:         3.00 cm LV PW:         1.20 cm LV IVS:        1.20 cm LVOT diam:     2.10 cm LV SV:         56 LV SV Index:   30 LVOT Area:     3.46 cm  RIGHT VENTRICLE RV S prime:     14.30 cm/s TAPSE (M-mode): 2.7 cm LEFT ATRIUM              Index        RIGHT ATRIUM           Index LA diam:        5.90 cm  3.15 cm/m   RA Area:     38.35 cm LA Vol (A2C):   138.5 ml 73.98 ml/m  RA Volume:   150.00 ml 80.12 ml/m LA Vol (A4C):   182.0 ml 97.21 ml/m LA Biplane Vol: 165.0 ml 88.13 ml/m  AORTIC VALVE AV Area (Vmax):    1.64 cm  AV Area (Vmean):   1.83 cm AV Area (VTI):     1.90 cm AV Vmax:           168.00 cm/s AV Vmean:          106.000 cm/s AV VTI:            0.294 m AV Peak Grad:      11.3 mmHg AV Mean Grad:      6.0 mmHg LVOT Vmax:         79.40 cm/s LVOT Vmean:        56.100 cm/s LVOT VTI:          0.161 m LVOT/AV VTI ratio: 0.55  AORTA Ao Root diam: 3.90 cm MITRAL VALVE                TRICUSPID VALVE MV Area (PHT): 3.27 cm     TR Peak grad:   21.9 mmHg MV Decel Time: 232 msec     TR Vmax:        234.00 cm/s MR Peak grad: 119.7 mmHg MR Mean grad: 75.0 mmHg     SHUNTS MR Vmax:      547.00 cm/s   Systemic VTI:  0.16 m MR Vmean:     401.0 cm/s    Systemic Diam: 2.10 cm MV E velocity: 107.00 cm/s MV A velocity: 33.30 cm/s MV E/A ratio:  3.21 Vishnu Priya Mallipeddi Electronically signed by Diannah Late Mallipeddi Signature Date/Time: 04/01/2024/2:10:59 PM    Final    CT CHEST ABDOMEN PELVIS WO CONTRAST Result Date: 04/01/2024 EXAM: CT CHEST, ABDOMEN AND PELVIS WITHOUT CONTRAST 04/01/2024 01:25:10 AM TECHNIQUE: CT of the chest, abdomen and pelvis was performed without the administration of intravenous contrast. Multiplanar reformatted images are provided for review. Automated exposure control, iterative reconstruction, and/or weight based adjustment of the mA/kV was utilized to reduce the radiation dose to as low as reasonably achievable.  COMPARISON: 02/07/2023. CLINICAL HISTORY: Sepsis. FINDINGS: CHEST: MEDIASTINUM AND LYMPH NODES: Cardiomegaly. Pacer wires in the right heart. Coronary artery aortic atherosclerosis. The central airways are clear. No mediastinal, hilar or axillary lymphadenopathy. LUNGS AND PLEURA: Mild to moderate bilateral pleural effusions, right greater than left. Consolidation with air bronchograms in the left lower lobe and posterior lingula concerning for pneumonia. Compressive atelectasis or pneumonia in the right lower lobe. Patchy ground glass airspace opacities also likely related to pneumonia. No  pneumothorax. ABDOMEN AND PELVIS: LIVER: Mild nodular contours of the liver suggest cirrhosis. GALLBLADDER AND BILE DUCTS: High-density material within the gallbladder could reflect small stones or contrast material. No biliary ductal dilatation. SPLEEN: The spleen is mildly enlarged at 13 cm craniocaudal length. PANCREAS: No acute abnormality. ADRENAL GLANDS: No acute abnormality. KIDNEYS, URETERS AND BLADDER: 1.6 cm stone in the upper pole of the right kidney. No hydronephrosis. No perinephric or periureteral stranding. Urinary bladder is unremarkable. GI AND BOWEL: The stomach is decompressed but appears to be thick-walled. This could be related to portal gastropathy or gastritis. Sigmoid diverticulosis. No active diverticulitis. Moderate stool burden throughout the colon. There is no bowel obstruction. REPRODUCTIVE ORGANS: Prostate enlargement. PERITONEUM AND RETROPERITONEUM: Moderate free fluid in the abdomen and pelvis. No free air. VASCULATURE: Aorta is normal in caliber. Aortoiliac catheter sclerosis. ABDOMINAL AND PELVIS LYMPH NODES: No lymphadenopathy. BONES AND SOFT TISSUES: Stable severe compression fracture at L1. Diffuse osteopenia. No focal soft tissue abnormality. IMPRESSION: 1. Left lower lobe and lingular consolidation with patchy bilateral ground-glass opacities, most consistent with multifocal pneumonia. 2. Compressive atelectasis versus additional pneumonia in the right lower lobe. 3. Mild to moderate bilateral pleural effusions, right greater than left. 4. Cirrhotic liver morphology with moderate abdominopelvic ascites and splenomegaly . 5. Gastric wall thickening may be related to portal gastropathy or gastritis . 6. Right upper pole nephrolithiasis . 7. Coronary artery disease, aortic atherosclerosis. 8. Sigmoid diverticulosis. Moderate stool burden in the colon. Electronically signed by: Franky Crease MD 04/01/2024 01:36 AM EST RP Workstation: [id number]   DG Chest Port 1 View Result Date:  04/01/2024 EXAM: 1 VIEW(S) XRAY OF THE CHEST 04/01/2024 01:00:00 AM COMPARISON: Comparison from 03/24/2024. CLINICAL HISTORY: Increased lethargy and shortness of breath. FINDINGS: LUNGS AND PLEURA: Increasing left basilar infiltrate. Small left pleural effusion. No pneumothorax. HEART AND MEDIASTINUM: The cardiac shadow is prominent. The pacemaker is again seen and stable. BONES AND SOFT TISSUES: No bony abnormality is noted. IMPRESSION: 1. Increasing left basilar infiltrate and small effusion. 2. Prominent cardiac shadow with stable pacemaker. Electronically signed by: Oneil Devonshire MD 04/01/2024 01:04 AM EST RP Workstation: [id number]   DG Chest Port 1 View Result Date: 03/24/2024 CLINICAL DATA:  Respiratory distress. EXAM: PORTABLE CHEST 1 VIEW COMPARISON:  03/03/2024 FINDINGS: The cardio pericardial silhouette is enlarged. Retrocardiac atelectasis or infiltrate noted. Right lung clear. Single lead left-sided permanent pacemaker again noted. Telemetry leads overlie the chest. IMPRESSION: Retrocardiac atelectasis or infiltrate. Electronically Signed   By: Camellia Candle M.D.   On: 03/24/2024 07:22    Alm Schneider, DO  [facility]  If 7PM-7AM, please contact night-coverage [email or web address] Password [email or web address] 04/07/2024, 5:08 PM   LOS: 4 days   "

## 2024-04-07 NOTE — Plan of Care (Signed)

## 2024-04-08 DIAGNOSIS — Z515 Encounter for palliative care: Secondary | ICD-10-CM | POA: Diagnosis not present

## 2024-04-08 NOTE — Plan of Care (Signed)
   Problem: Activity: Goal: Risk for activity intolerance will decrease Outcome: Progressing   Problem: Coping: Goal: Level of anxiety will decrease Outcome: Progressing

## 2024-04-08 NOTE — Plan of Care (Signed)
 " Problem: Education: Goal: Knowledge of General Education information will improve Description: Including pain rating scale, medication(s)/side effects and non-pharmacologic comfort measures 04/08/2024 1835 by Alvina Deland SAILOR, RN Outcome: Not Progressing 04/08/2024 1834 by Alvina Deland SAILOR, RN Outcome: Progressing   Problem: Health Behavior/Discharge Planning: Goal: Ability to manage health-related needs will improve 04/08/2024 1835 by Alvina Deland SAILOR, RN Outcome: Not Progressing 04/08/2024 1834 by Alvina Deland SAILOR, RN Outcome: Progressing   Problem: Clinical Measurements: Goal: Ability to maintain clinical measurements within normal limits will improve 04/08/2024 1835 by Alvina Deland SAILOR, RN Outcome: Not Progressing 04/08/2024 1834 by Alvina Deland SAILOR, RN Outcome: Progressing Goal: Will remain free from infection 04/08/2024 1835 by Alvina Deland SAILOR, RN Outcome: Not Progressing 04/08/2024 1834 by Alvina Deland SAILOR, RN Outcome: Progressing Goal: Diagnostic test results will improve 04/08/2024 1835 by Alvina Deland SAILOR, RN Outcome: Not Progressing 04/08/2024 1834 by Alvina Deland SAILOR, RN Outcome: Progressing Goal: Respiratory complications will improve 04/08/2024 1835 by Alvina Deland SAILOR, RN Outcome: Not Progressing 04/08/2024 1834 by Alvina Deland SAILOR, RN Outcome: Progressing Goal: Cardiovascular complication will be avoided 04/08/2024 1835 by Alvina Deland SAILOR, RN Outcome: Not Progressing 04/08/2024 1834 by Alvina Deland SAILOR, RN Outcome: Progressing   Problem: Activity: Goal: Risk for activity intolerance will decrease 04/08/2024 1835 by Alvina Deland SAILOR, RN Outcome: Not Progressing 04/08/2024 1834 by Alvina Deland SAILOR, RN Outcome: Progressing   Problem: Nutrition: Goal: Adequate nutrition will be maintained 04/08/2024 1835 by Alvina Deland SAILOR, RN Outcome: Not Progressing 04/08/2024 1834 by Alvina Deland SAILOR, RN Outcome: Progressing   Problem: Coping: Goal: Level  of anxiety will decrease 04/08/2024 1835 by Alvina Deland SAILOR, RN Outcome: Not Progressing 04/08/2024 1834 by Alvina Deland SAILOR, RN Outcome: Progressing   Problem: Elimination: Goal: Will not experience complications related to bowel motility 04/08/2024 1835 by Alvina Deland SAILOR, RN Outcome: Not Progressing 04/08/2024 1834 by Alvina Deland SAILOR, RN Outcome: Progressing Goal: Will not experience complications related to urinary retention 04/08/2024 1835 by Alvina Deland SAILOR, RN Outcome: Not Progressing 04/08/2024 1834 by Alvina Deland SAILOR, RN Outcome: Progressing   Problem: Pain Managment: Goal: General experience of comfort will improve and/or be controlled 04/08/2024 1835 by Alvina Deland SAILOR, RN Outcome: Not Progressing 04/08/2024 1834 by Alvina Deland SAILOR, RN Outcome: Progressing   Problem: Safety: Goal: Ability to remain free from injury will improve 04/08/2024 1835 by Alvina Deland SAILOR, RN Outcome: Not Progressing 04/08/2024 1834 by Alvina Deland SAILOR, RN Outcome: Progressing   Problem: Skin Integrity: Goal: Risk for impaired skin integrity will decrease 04/08/2024 1835 by Alvina Deland SAILOR, RN Outcome: Not Progressing 04/08/2024 1834 by Alvina Deland SAILOR, RN Outcome: Progressing   Problem: Education: Goal: Knowledge of the prescribed therapeutic regimen will improve 04/08/2024 1835 by Alvina Deland SAILOR, RN Outcome: Not Progressing 04/08/2024 1834 by Alvina Deland SAILOR, RN Outcome: Progressing   Problem: Coping: Goal: Ability to identify and develop effective coping behavior will improve 04/08/2024 1835 by Alvina Deland SAILOR, RN Outcome: Not Progressing 04/08/2024 1834 by Alvina Deland SAILOR, RN Outcome: Progressing   Problem: Clinical Measurements: Goal: Quality of life will improve 04/08/2024 1835 by Alvina Deland SAILOR, RN Outcome: Not Progressing 04/08/2024 1834 by Alvina Deland SAILOR, RN Outcome: Progressing   Problem: Respiratory: Goal: Verbalizations of increased ease of  respirations will increase 04/08/2024 1835 by Alvina Deland SAILOR, RN Outcome: Not Progressing 04/08/2024 1834 by Alvina Deland SAILOR, RN Outcome: Progressing   Problem: Role Relationship: Goal: Family's ability to cope with current situation will improve 04/08/2024  1835 by Alvina Deland SAILOR, RN Outcome: Not Progressing 04/08/2024 1834 by Alvina Deland SAILOR, RN Outcome: Progressing Goal: Ability to verbalize concerns, feelings, and thoughts to partner or family member will improve 04/08/2024 1835 by Alvina Deland SAILOR, RN Outcome: Not Progressing 04/08/2024 1834 by Alvina Deland SAILOR, RN Outcome: Progressing   Problem: Pain Management: Goal: Satisfaction with pain management regimen will improve 04/08/2024 1835 by Alvina Deland SAILOR, RN Outcome: Not Progressing 04/08/2024 1834 by Alvina Deland SAILOR, RN Outcome: Progressing   "

## 2024-04-08 NOTE — Progress Notes (Signed)
 "          PROGRESS NOTE  Jay Campbell FMW:[id number] DOB: Jun 12, 1945 DOA: 04/03/2024 PCP: [name], MD  Brief History:  79 y.o. male with medical history significant for Alzheimer's dementia, history of agitation due to dementia, coronary artery disease, permanent atrial fibrillation previously on Eliquis , complete heart block status post pacemaker placement, chronic thrombocytopenia, type 2 diabetes, hypertension, hyperlipidemia, GERD, BPH, who presents to the ER via EMS from SNF due to shortness of breath and generalized weakness.  The patient was discharged yesterday after being admitted for sepsis secondary to pneumonia and acute hypoxic respiratory failure requiring 2 L nasal cannula.  Staff at SNF reported the patient was more short of breath and lethargic than baseline today.  EMS was activated.  He received albuterol  nebs en route via EMS.   In the ER, hypoxic with O2 saturation of 87% on 2 L nasal cannula.  The patient is alert and confused in the setting of dementia.  Frail-appearing with pressure wounds in heels.  He is minimally verbal and unable to provide a history.     A chest x-ray revealed increasing left basilar infiltrates and small effusion.  Prominent cardiac shadow with stable pacemaker.  Lactic acid 3.9.  There was concern for sepsis.  Subsequently, his CT chest abdomen pelvis without contrast revealed left lower lobe and lingular consolidation with patchy bilateral ground glass opacities more consistent with multifocal pneumonia.  Compressive atelectasis versus additional pneumonia in the right lower lobe.  Mild to moderate bilateral pleural effusions, right greater than left.  Cirrhotic liver morphology with moderate abdominal pelvic ascites and splenomegaly.  Gastric wall thickening may be related to portal gastropathy or gastritis.  Right upper lobe nephrolithiasis.  Coronary artery disease, aortic atherosclerosis.  Sigmoid diverticulosis.  Moderate stool burden in the  colon.   The patient received 1 dose of cefepime  and azithromycin  in the ER.  TRH, hospitalist service, was asked to admit for worsening pneumonia.  Subsequently, the patient's antibiotics were discontinued as the patient received a full course of antibiotics during his last hospitalization from 03/24/2024 to 03/31/2024.  He remained afebrile and hemodynamically stable. Unfortunately, the patient's mental status did not improve much.  He was noted to be dehydrated with a free water  deficit.  Sodium went up to 155.  He was started on IV hypotonic fluid.  Although his sodium did gradually improve, the patient remained somnolent with minimal to no p.o. intake.  He continued to have dysphagia.  The patient was seen by speech therapy.  MBS was performed and did not reveal aspiration; however, patient was felt to be at continued risk for aspiration secondary to his impaired cognition and deconditioning.  A dysphagia 1 diet was ultimately recommended.  Palliative medicine was consulted.  After meetings with the patient's family, the patient's son was ultimately agreeable to transition to full comfort measures after the patient was accepted to residential hospice.  A bed was not available at [facility].  The patient was admitted to the hospital for hospital hospice care until bed is available at [facility] house.  Palliative medicine continue to follow.  They spoke with patient's son and verified desire to transition to full comfort measures.   Assessment/Plan: Adult Failure to Thrive -- unfortunately, despite maximal medical interventions and recent prolonged hospitalization of 7 days he has bounced back into the hospital within 24 hours. -- Patient has significant dysphagia and unable to eat/drink enough to maintain body requirements for fluids and nutrition --  Patient was initially started on IV antibiotics and IV fluids. --He did not improve clinically.  He continued to be somnolent with very poor to no oral  intake -- continue DNR/DNI orders present on admission -Ultimately, patient's family agreed to transition to full comfort measures after the patient was accepted to residential hospice. - Patient now has been transitioned to general inpatient hospice and for comfort measures have been instituted. - added oral morphine  solution   Multifocal pneumonia, seen on CT scan -- Pneumonia was fully treated during previous hospitalization -- DC antibiotics -- Patient remained afebrile hemodynamically stable - Patient now has been transitioned to general inpatient hospice and for comfort measures have been instituted.   Severe Dysphagia -- SLP evaluation requested -- MBS done--no definitive aspiration -- Patient continued to remain high risk for aspiration -Dysphagia 1 diet ordered>> pleasure feeding -Oral intake remains very poor - Patient now has been transitioned to general inpatient hospice and for comfort measures have been instituted.   Bilateral mild to moderate pleural effusions, seen on CT scan -- Closely monitor volume status -- Secondary to hepatic hydrothorax -Cirrhotic liver noted on CT abdomen and pelvis   Chronic hypoxic respiratory failure secondary to the above -- Recently started on 2 L O2 supplementation during previous admission for pneumonia. -- Unfortunately supplemental oxygen  wasn't provided at facility -Stable on 2 to 3 L presently. - Patient now has been transitioned to general inpatient hospice and for comfort measures have been instituted.   Lactic acidosis -- Secondary to Hypoxia, hypoperfusion, volume depletion -- Lactic acid is trending down  -- Follow peripheral blood cultures x 2. -- IV fluid hydration  -- Sepsis has been ruled out   Elevated troponin, suspect demand ischemia secondary to the above History of coronary artery disease Aortic atherosclerosis High-sensitivity troponin 23, 26. No evidence of acute ischemia on twelve-lead EKG No chest pain  reported. Monitor on telemetry.   Chronic HFpEF Last 2D echo done on 05/09/2019 revealed LVEF 60 to 65% Presented with dyspnea, cardiomegaly, and bilateral pleural effusions -04/01/2024 echo EF 60 to 65%, no WMA, normal RVF - Patient now has been transitioned to general inpatient hospice and for comfort measures have been instituted.   Hypernatremia Speech therapy is consulted to rule out dysphagia Hypotonic IV fluid to correct free water  deficit --Patient now transition to full comfort measures   Hypokalemia -initially repleted -now on comfort measures   Gastric wall thickening, may be related to portal gastropathy or gastritis, seen on CT scan. IV PPI Protonix  40 mg daily initially No reported abdominal pain or GI bleed Avoid NSAIDs Patient may transition to full comfort measures   Generalized weakness He is chronically debilitated and this is unchanged Palliative consultation requested    Type 2 diabetes with hyperglycemia Last hemoglobin A1c 7.3 on 03/24/2024   Hypertension Resume home Lopressor  Closely monitor vital signs.   Alzheimer's dementia with history of agitation Resume home regimen. Aricept  held due to transient bradycardia. -now on comfort measures care   Permanent atrial fibrillation, no longer on Eliquis  due to chronic thrombocytopenia Currently rate controlled --- Patient now has been transitioned to general inpatient hospice and for comfort measures have been instituted.             Family Communication:   no Family at bedside   Consultants:  palliative   Code Status:  FULL COMFORT   DVT Prophylaxis:  FULL COMFORT     Procedures: As Listed in Progress Note Above   Antibiotics: none  Subjective: Patient is somnolent.  He wakes up to voice.  No reports of vomiting, respiratory distress, uncontrolled pain  Objective: Vitals:   04/07/24 1425 04/07/24 2014 04/08/24 0500 04/08/24 1300  BP: (!) 92/56 (!) 135/41 (!) (P)  114/53 (!) 160/68  Pulse: 67 60 (P) 60 63  Resp: 16 18  16   Temp: 98.9 F (37.2 C) (!) 100.6 F (38.1 C) (P) 99 F (37.2 C) 98 F (36.7 C)  TempSrc: Axillary Axillary (P) Axillary Axillary  SpO2: 94% 96%  100%    Intake/Output Summary (Last 24 hours) at 04/08/2024 1741 Last data filed at 04/08/2024 0800 Gross per 24 hour  Intake 120 ml  Output 300 ml  Net -180 ml   Weight change:  Exam:  General:  Pt is somnolent, does not follows commands appropriately, not in acute distress HEENT: No icterus, No thrush, No neck mass, [place]/AT Cardiovascular: RRR, S1/S2, no rubs, no gallops Respiratory: Bibasilar rales but no wheezing Abdomen: Soft/+BS, non tender, non distended, no guarding Extremities: No edema, No lymphangitis, No petechiae, No rashes, no synovitis   Data Reviewed: I have personally reviewed following labs and imaging studies Basic Metabolic Panel: Recent Labs  Lab 04/02/24 0444 04/03/24 0450  NA 150* 146*  K 3.6 3.3*  CL 119* 114*  CO2 20* 22  GLUCOSE 101* 119*  BUN 41* 35*  CREATININE 1.17 0.99  CALCIUM 7.7* 7.2*  MG 1.8  --   PHOS 2.9  --    Liver Function Tests: No results for input(s): AST, ALT, ALKPHOS, BILITOT, PROT, ALBUMIN in the last 168 hours. No results for input(s): LIPASE, AMYLASE in the last 168 hours. No results for input(s): AMMONIA in the last 168 hours. Coagulation Profile: No results for input(s): INR, PROTIME in the last 168 hours. CBC: Recent Labs  Lab 04/02/24 0444  WBC 5.2  NEUTROABS 4.3  HGB 10.7*  HCT 33.6*  MCV 108.4*  PLT 43*   Cardiac Enzymes: No results for input(s): CKTOTAL, CKMB, CKMBINDEX, TROPONINI in the last 168 hours. BNP: Invalid input(s): POCBNP CBG: Recent Labs  Lab 04/02/24 2000 04/03/24 0000 04/03/24 0407 04/03/24 0731 04/03/24 1123  GLUCAP 179* 97 108* 134* 106*   HbA1C: No results for input(s): HGBA1C in the last 72 hours. Urine analysis:    Component Value  Date/Time   COLORURINE COLORLESS (A) 04/01/2024 0658   APPEARANCEUR CLEAR 04/01/2024 0658   LABSPEC 1.008 04/01/2024 0658   PHURINE 5.0 04/01/2024 0658   GLUCOSEU NEGATIVE 04/01/2024 0658   HGBUR MODERATE (A) 04/01/2024 0658   BILIRUBINUR NEGATIVE 04/01/2024 0658   KETONESUR NEGATIVE 04/01/2024 0658   PROTEINUR NEGATIVE 04/01/2024 0658   NITRITE NEGATIVE 04/01/2024 0658   LEUKOCYTESUR NEGATIVE 04/01/2024 0658   Sepsis Labs: @LABRCNTIP (procalcitonin:4,lacticidven:4) ) Recent Results (from the past 240 hours)  Blood Culture (routine x 2)     Status: None   Collection Time: 04/01/24 12:50 AM   Specimen: BLOOD RIGHT FOREARM  Result Value Ref Range Status   Specimen Description BLOOD RIGHT FOREARM  Final   Special Requests   Final    BOTTLES DRAWN AEROBIC AND ANAEROBIC Blood Culture adequate volume   Culture   Final    NO GROWTH 5 DAYS Performed at [facility], [address], [place], [place] [address]    Report Status 04/06/2024 FINAL  Final  Blood Culture (routine x 2)     Status: None   Collection Time: 04/01/24  1:39 AM   Specimen: Right Antecubital; Blood  Result Value Ref  Range Status   Specimen Description RIGHT ANTECUBITAL  Final   Special Requests   Final    BOTTLES DRAWN AEROBIC AND ANAEROBIC Blood Culture adequate volume   Culture   Final    NO GROWTH 5 DAYS Performed at [facility], [address] [place], [place] [address]    Report Status 04/06/2024 FINAL  Final     Scheduled Meds: Continuous Infusions:  Procedures/Studies: DG Swallowing Func-Speech Pathology Result Date: 04/02/2024 Table formatting from the original result was not included. Modified Barium Swallow Study Patient Details Name: SLOANE JUNKIN MRN: [id number] Date of Birth: 03-20-1945 Today's Date: 04/02/2024 HPI/PMH: HPI: Jay Campbell is a 79 y.o. male with medical history significant for Alzheimer's dementia, history of agitation due to dementia, coronary artery disease, permanent atrial  fibrillation previously on Eliquis , complete heart block status post pacemaker placement, chronic thrombocytopenia, type 2 diabetes, hypertension, hyperlipidemia, GERD, BPH, who presents to the ER via EMS from SNF due to shortness of breath and generalized weakness. The patient was discharged yesterday after being admitted for sepsis secondary to pneumonia and acute hypoxic respiratory failure requiring 2 L nasal cannula. Staff at SNF reported the patient was more short of breath and lethargic than baseline today. EMS was activated. He received albuterol  nebs en route via EMS. In the ER, hypoxic with O2 saturation of 87% on 2 L nasal cannula. The patient is alert and confused in the setting of dementia. Frail-appearing with pressure wounds in heels. He is minimally verbal and unable to provide a history. A chest x-ray revealed increasing left basilar infiltrates and small effusion. Prominent cardiac shadow with stable pacemaker. Lactic acid 3.9. There was concern for sepsis. Subsequently, his CT chest abdomen pelvis without contrast revealed left lower lobe and lingular consolidation with patchy bilateral ground glass opacities more consistent with multifocal pneumonia. Compressive atelectasis versus additional pneumonia in the right lower lobe. Mild to moderate bilateral pleural effusions, right greater than left. Pt was seen by SLP service on Thursday and was discharged over the weekend. BSE completed and pt exhibiting overt s/s of aspiration and placed NPO. MBS ordered to determine safest diet and assess swallow function objectively. Clinical Impression: Clinical Impression: Pt presents with mild oropharyngeal dysphagia compounded by cognitive-based dysphagia c/b escape to lateral buccal cavity/floor of mouth and slow, prolonged mastication with solids.  Delayed initiation of tongue motion (oral holding) and delayed initiation of the swallow (typical for age) at the level of the valleculae throughout study.   Diminished pharyngeal stripping wave and partial epiglottic inflection observed potentially d/t bulging PPW/?cervical osteophytes.  Decreased tongue base retraction resulting in diffuse residue within the vallecular space, pyriform sinuses and posterior pharyngeal wall intermittently during study.  It should be noted pt exhibited xerostomia prior to any po intake d/t NPO status which may have contributed to amount of residue within pharynx and other structures.  As the study progressed and compensatory strategies instilled including liquid wash, alternating solids/liquids and multiple effortful swallows, residue cleared to mild-insignificant amount within affected structures.  Partial distension/obstruction of flow observed in UES d/t prominent CP bar presence.  NO aspiration and/or penetration observed throughout study, but d/t pt's impaired cognition and deconditioning/dysphagia, he is at risk for aspiration if swallow strategies/A with meals/full supervision is not followed.  Recommend initiating a Dysphagia 1(puree)/thin liquid diet via tsp amounts with FULL supervision/A with meals.  ST will f/u for dysphagia tx/education during remainder of acute stay.  ST should f/u at next venue of care. Factors that  may increase risk of adverse event in presence of aspiration Noe & Lianne 2021): Factors that may increase risk of adverse event in presence of aspiration Noe & Lianne 2021): Poor general health and/or compromised immunity; Reduced cognitive function; Frail or deconditioned; Weak cough; Aspiration of thick, dense, and/or acidic materials Recommendations/Plan: Swallowing Evaluation Recommendations Swallowing Evaluation Recommendations Recommendations: PO diet PO Diet Recommendation: Dysphagia 1 (Pureed); Thin liquids (Level 0) Liquid Administration via: Spoon Medication Administration: Crushed with puree Supervision: Full assist for feeding; Full supervision/cueing for swallowing strategies Swallowing  strategies  : Slow rate; Small bites/sips; Minimize environmental distractions; effortful swallow; Multiple dry swallows after each bite/sip; Follow solids with liquids Postural changes: Position pt fully upright for meals; Stay upright 30-60 min after meals Oral care recommendations: Oral care BID (2x/day); Staff/trained caregiver to provide oral care Treatment Plan Treatment Plan Treatment recommendations: Therapy as outlined in treatment plan below Follow-up recommendations: Skilled nursing-short term rehab (<3 hours/day) Functional status assessment: Patient has had a recent decline in their functional status and demonstrates the ability to make significant improvements in function in a reasonable and predictable amount of time. Treatment frequency: Min 2x/week Treatment duration: 1 week Interventions: Aspiration precaution training; Compensatory techniques; Patient/family education; Trials of upgraded texture/liquids; Diet toleration management by SLP Recommendations Recommendations for follow up therapy are one component of a multi-disciplinary discharge planning process, led by the attending physician.  Recommendations may be updated based on patient status, additional functional criteria and insurance authorization. Assessment: Orofacial Exam: Orofacial Exam Oral Cavity: Oral Hygiene: Xerostomia Oral Cavity - Dentition: Poor condition; Missing dentition Orofacial Anatomy: WFL Oral Motor/Sensory Function: Generalized oral weakness Anatomy: Anatomy: WFL Boluses Administered: Boluses Administered Boluses Administered: Thin liquids (Level 0); Mildly thick liquids (Level 2, nectar thick); Moderately thick liquids (Level 3, honey thick); Puree; Solid  Oral Impairment Domain: Oral Impairment Domain Lip Closure: No labial escape Tongue control during bolus hold: Escape to lateral buccal cavity/floor of mouth Bolus preparation/mastication: Slow prolonged chewing/mashing with complete recollection Bolus  transport/lingual motion: Delayed initiation of tongue motion (oral holding) Oral residue: Trace residue lining oral structures Location of oral residue : Tongue Initiation of pharyngeal swallow : Valleculae  Pharyngeal Impairment Domain: Pharyngeal Impairment Domain Soft palate elevation: No bolus between soft palate (SP)/pharyngeal wall (PW) Laryngeal elevation: Complete superior movement of thyroid  cartilage with complete approximation of arytenoids to epiglottic petiole Anterior hyoid excursion: Complete anterior movement Epiglottic movement: Partial inversion Laryngeal vestibule closure: Complete, no air/contrast in laryngeal vestibule Pharyngeal stripping wave : Present - diminished Pharyngeal contraction (A/P view only): N/A Pharyngoesophageal segment opening: Partial distention/partial duration, partial obstruction of flow Tongue base retraction: Narrow column of contrast or air between tongue base and PPW Pharyngeal residue: Collection of residue within or on pharyngeal structures Location of pharyngeal residue: Diffuse (>3 areas)  Esophageal Impairment Domain: Esophageal Impairment Domain Esophageal clearance upright position: Complete clearance, esophageal coating Pill: Pill Consistency administered: -- (n/a) Penetration/Aspiration Scale Score: Penetration/Aspiration Scale Score 1.  Material does not enter airway: Thin liquids (Level 0); Mildly thick liquids (Level 2, nectar thick); Moderately thick liquids (Level 3, honey thick); Puree; Solid Compensatory Strategies: Compensatory Strategies Compensatory strategies: Yes Effortful swallow: Effective Effective Effortful Swallow: Thin liquid (Level 0); Mildly thick liquid (Level 2, nectar thick); Puree Multiple swallows: Effective Effective Multiple Swallows: Thin liquid (Level 0); Mildly thick liquid (Level 2, nectar thick); Puree Liquid wash: Effective Effective Liquid Wash: Thin liquid (Level 0); Mildly thick liquid (Level 2, nectar thick); Puree; Solid    General Information: Caregiver  present: No  Diet Prior to this Study: NPO   Temperature : Normal   Respiratory Status: WFL   Supplemental O2: Nasal cannula (3L)   History of Recent Intubation: No  Behavior/Cognition: Alert; Cooperative; Distractible; Requires cueing Self-Feeding Abilities: Dependent for feeding Baseline vocal quality/speech: Hypophonia/low volume; Dysphonic Volitional Cough: Able to elicit Volitional Swallow: Unable to elicit Exam Limitations: No limitations Goal Planning: Prognosis for improved oropharyngeal function: Good Barriers to Reach Goals: Cognitive deficits No data recorded Patient/Family Stated Goal: n/a Consulted and agree with results and recommendations: Patient; Pt unable/family or caregiver not available; Nurse; Physician Pain: Pain Assessment Pain Assessment: Faces Faces Pain Scale: 2 Breathing: 0 Negative Vocalization: 1 Facial Expression: 1 Body Language: 0 Consolability: 0 PAINAD Score: 0 Pain Location: generalized with movement Pain Descriptors / Indicators: Discomfort; Grimacing Pain Intervention(s): Limited activity within patient's tolerance; Repositioned End of Session: Start Time:SLP Start Time (ACUTE ONLY): 1014 Stop Time: SLP Stop Time (ACUTE ONLY): 1050 Time Calculation:SLP Time Calculation (min) (ACUTE ONLY): 36 min Charges: SLP Evaluations $ SLP Speech Visit: 1 Visit SLP Evaluations $BSS Swallow: 1 Procedure $MBS Swallow: 1 Procedure SLP visit diagnosis: SLP Visit Diagnosis: Dysphagia, oropharyngeal phase (R13.12) Past Medical History: Past Medical History: Diagnosis Date  Alzheimer's dementia (HCC)   Anxiety   Atrial fibrillation (HCC)   Permanent  Coronary artery disease   Mild nonobstructive 1/12  DM (diabetes mellitus) (HCC)   GERD (gastroesophageal reflux disease)   Gout   Hearing disorder, cochlear   Hiatal hernia   HLD (hyperlipidemia)   HTN (hypertension)   S/P AV nodal ablation   s/p SJM PPM; gen change 02-01-13 by Dr Kelsie  Smoldering multiple myeloma  12/29/2017  Warfarin anticoagulation  Past Surgical History: Past Surgical History: Procedure Laterality Date  CATARACT EXTRACTION Right   COCHLEAR IMPLANT Right   HEMORRHOID BANDING    KNEE ARTHROSCOPY Right 1992  Dr Brenna   PACEMAKER GENERATOR CHANGE Bilateral 02/01/2013  Procedure: PACEMAKER GENERATOR CHANGE;  Surgeon: Lynwood JONETTA Kelsie, MD;  Location: [facility] CATH [facility];  Service: Cardiovascular;  Laterality: Bilateral;  PACEMAKER PLACEMENT  2002; 2014  SJM implanted by Dr Waddell with AV nodal ablation performed; gen change to [employer] SR RF by Dr Kelsie 02-01-13  RETINAL LASER PROCEDURE Left   SLT LASER APPLICATION Left 12/14/2015  Procedure: SLT LASER APPLICATION;  Surgeon: Dow JULIANNA Burke, MD;  Location: [facility];  Service: Ophthalmology;  Laterality: Left; Pat Adams,M.S.,CCC-SLP 04/02/2024, 11:23 AM  ECHOCARDIOGRAM COMPLETE Result Date: 04/01/2024    ECHOCARDIOGRAM REPORT   Patient Name:   Jay Campbell Date of Exam: 04/01/2024 Medical Rec #:  [id number]        Height:       65.0 in Accession #:    [id number]       Weight:       175.7 lb Date of Birth:  Dec 08, 1945        BSA:          1.872 m Patient Age:    78 years         BP:           158/69 mmHg Patient Gender: M                HR:           63 bpm. Exam Location:  [facility] Procedure: 2D Echo, Cardiac Doppler and Color Doppler (Both Spectral and Color            Flow Doppler were utilized  during procedure). Indications:    CHF-Acute Diastolic I50.31  History:        Patient has prior history of Echocardiogram examinations, most                 recent 05/09/2019. CAD, Pacemaker, Arrythmias:Atrial Fibrillation                 and Complete heart block (HCC); Risk Factors:Hypertension,                 Diabetes, Dyslipidemia and Non-Smoker. Alzheimer's dementia                 (HCC) (From Hx).  Sonographer:    Aida Pizza RCS Referring Phys: [id number] TERRY LOISE HURST  Sonographer Comments: Image acquisition challenging due to patient behavioral factors. IMPRESSIONS   1. Left ventricular ejection fraction, by estimation, is 60 to 65%. The left ventricle has normal function. The left ventricle has no regional wall motion abnormalities. There is mild left ventricular hypertrophy. Left ventricular diastolic parameters are indeterminate.  2. Right ventricular systolic function is normal. The right ventricular size is mildly enlarged. There is normal pulmonary artery systolic pressure.  3. Left atrial size was severely dilated.  4. Right atrial size was severely dilated.  5. The mitral valve is abnormal. Mild to moderate mitral valve regurgitation. No evidence of mitral stenosis. Severe mitral annular calcification.  6. The aortic valve is tricuspid. There is mild calcification of the aortic valve. There is mild thickening of the aortic valve. Aortic valve regurgitation is not visualized. No aortic stenosis is present. Aortic valve mean gradient measures 6.0 mmHg.  7. Aortic dilatation noted. There is borderline dilatation of the aortic root, measuring 39 mm.  8. The inferior vena cava is normal in size with <50% respiratory variability, suggesting right atrial pressure of 8 mmHg. Comparison(s): No significant change from prior study. FINDINGS  Left Ventricle: Left ventricular ejection fraction, by estimation, is 60 to 65%. The left ventricle has normal function. The left ventricle has no regional wall motion abnormalities. Strain was performed and the global longitudinal strain is indeterminate. The left ventricular internal cavity size was normal in size. There is mild left ventricular hypertrophy. Left ventricular diastolic parameters are indeterminate. Right Ventricle: The right ventricular size is mildly enlarged. No increase in right ventricular wall thickness. Right ventricular systolic function is normal. There is normal pulmonary artery systolic pressure. The tricuspid regurgitant velocity is 2.34  m/s, and with an assumed right atrial pressure of 8 mmHg, the estimated right  ventricular systolic pressure is 29.9 mmHg. Left Atrium: Left atrial size was severely dilated. Right Atrium: Right atrial size was severely dilated. Pericardium: There is no evidence of pericardial effusion. Mitral Valve: The mitral valve is abnormal. Severe mitral annular calcification. Mild to moderate mitral valve regurgitation. No evidence of mitral valve stenosis. Tricuspid Valve: The tricuspid valve is normal in structure. Tricuspid valve regurgitation is mild . No evidence of tricuspid stenosis. Aortic Valve: The aortic valve is tricuspid. There is mild calcification of the aortic valve. There is mild thickening of the aortic valve. Aortic valve regurgitation is not visualized. No aortic stenosis is present. Aortic valve mean gradient measures 6.0 mmHg. Aortic valve peak gradient measures 11.3 mmHg. Aortic valve area, by VTI measures 1.90 cm. Pulmonic Valve: The pulmonic valve was normal in structure. Pulmonic valve regurgitation is trivial. No evidence of pulmonic stenosis. Aorta: Aortic dilatation noted. There is borderline dilatation of the aortic root, measuring 39 mm. Venous: The  inferior vena cava is normal in size with less than 50% respiratory variability, suggesting right atrial pressure of 8 mmHg. IAS/Shunts: No atrial level shunt detected by color flow Doppler. Additional Comments: 3D was performed not requiring image post processing on an independent workstation and was indeterminate.  LEFT VENTRICLE PLAX 2D LVIDd:         4.80 cm LVIDs:         3.00 cm LV PW:         1.20 cm LV IVS:        1.20 cm LVOT diam:     2.10 cm LV SV:         56 LV SV Index:   30 LVOT Area:     3.46 cm  RIGHT VENTRICLE RV S prime:     14.30 cm/s TAPSE (M-mode): 2.7 cm LEFT ATRIUM              Index        RIGHT ATRIUM           Index LA diam:        5.90 cm  3.15 cm/m   RA Area:     38.35 cm LA Vol (A2C):   138.5 ml 73.98 ml/m  RA Volume:   150.00 ml 80.12 ml/m LA Vol (A4C):   182.0 ml 97.21 ml/m LA Biplane Vol:  165.0 ml 88.13 ml/m  AORTIC VALVE AV Area (Vmax):    1.64 cm AV Area (Vmean):   1.83 cm AV Area (VTI):     1.90 cm AV Vmax:           168.00 cm/s AV Vmean:          106.000 cm/s AV VTI:            0.294 m AV Peak Grad:      11.3 mmHg AV Mean Grad:      6.0 mmHg LVOT Vmax:         79.40 cm/s LVOT Vmean:        56.100 cm/s LVOT VTI:          0.161 m LVOT/AV VTI ratio: 0.55  AORTA Ao Root diam: 3.90 cm MITRAL VALVE                TRICUSPID VALVE MV Area (PHT): 3.27 cm     TR Peak grad:   21.9 mmHg MV Decel Time: 232 msec     TR Vmax:        234.00 cm/s MR Peak grad: 119.7 mmHg MR Mean grad: 75.0 mmHg     SHUNTS MR Vmax:      547.00 cm/s   Systemic VTI:  0.16 m MR Vmean:     401.0 cm/s    Systemic Diam: 2.10 cm MV E velocity: 107.00 cm/s MV A velocity: 33.30 cm/s MV E/A ratio:  3.21 Vishnu Priya Mallipeddi Electronically signed by Diannah Late Mallipeddi Signature Date/Time: 04/01/2024/2:10:59 PM    Final    CT CHEST ABDOMEN PELVIS WO CONTRAST Result Date: 04/01/2024 EXAM: CT CHEST, ABDOMEN AND PELVIS WITHOUT CONTRAST 04/01/2024 01:25:10 AM TECHNIQUE: CT of the chest, abdomen and pelvis was performed without the administration of intravenous contrast. Multiplanar reformatted images are provided for review. Automated exposure control, iterative reconstruction, and/or weight based adjustment of the mA/kV was utilized to reduce the radiation dose to as low as reasonably achievable. COMPARISON: 02/07/2023. CLINICAL HISTORY: Sepsis. FINDINGS: CHEST: MEDIASTINUM AND LYMPH NODES: Cardiomegaly. Pacer wires in the right heart. Coronary  artery aortic atherosclerosis. The central airways are clear. No mediastinal, hilar or axillary lymphadenopathy. LUNGS AND PLEURA: Mild to moderate bilateral pleural effusions, right greater than left. Consolidation with air bronchograms in the left lower lobe and posterior lingula concerning for pneumonia. Compressive atelectasis or pneumonia in the right lower lobe. Patchy ground glass  airspace opacities also likely related to pneumonia. No pneumothorax. ABDOMEN AND PELVIS: LIVER: Mild nodular contours of the liver suggest cirrhosis. GALLBLADDER AND BILE DUCTS: High-density material within the gallbladder could reflect small stones or contrast material. No biliary ductal dilatation. SPLEEN: The spleen is mildly enlarged at 13 cm craniocaudal length. PANCREAS: No acute abnormality. ADRENAL GLANDS: No acute abnormality. KIDNEYS, URETERS AND BLADDER: 1.6 cm stone in the upper pole of the right kidney. No hydronephrosis. No perinephric or periureteral stranding. Urinary bladder is unremarkable. GI AND BOWEL: The stomach is decompressed but appears to be thick-walled. This could be related to portal gastropathy or gastritis. Sigmoid diverticulosis. No active diverticulitis. Moderate stool burden throughout the colon. There is no bowel obstruction. REPRODUCTIVE ORGANS: Prostate enlargement. PERITONEUM AND RETROPERITONEUM: Moderate free fluid in the abdomen and pelvis. No free air. VASCULATURE: Aorta is normal in caliber. Aortoiliac catheter sclerosis. ABDOMINAL AND PELVIS LYMPH NODES: No lymphadenopathy. BONES AND SOFT TISSUES: Stable severe compression fracture at L1. Diffuse osteopenia. No focal soft tissue abnormality. IMPRESSION: 1. Left lower lobe and lingular consolidation with patchy bilateral ground-glass opacities, most consistent with multifocal pneumonia. 2. Compressive atelectasis versus additional pneumonia in the right lower lobe. 3. Mild to moderate bilateral pleural effusions, right greater than left. 4. Cirrhotic liver morphology with moderate abdominopelvic ascites and splenomegaly . 5. Gastric wall thickening may be related to portal gastropathy or gastritis . 6. Right upper pole nephrolithiasis . 7. Coronary artery disease, aortic atherosclerosis. 8. Sigmoid diverticulosis. Moderate stool burden in the colon. Electronically signed by: Franky Crease MD 04/01/2024 01:36 AM EST RP  Workstation: [id number]   DG Chest Port 1 View Result Date: 04/01/2024 EXAM: 1 VIEW(S) XRAY OF THE CHEST 04/01/2024 01:00:00 AM COMPARISON: Comparison from 03/24/2024. CLINICAL HISTORY: Increased lethargy and shortness of breath. FINDINGS: LUNGS AND PLEURA: Increasing left basilar infiltrate. Small left pleural effusion. No pneumothorax. HEART AND MEDIASTINUM: The cardiac shadow is prominent. The pacemaker is again seen and stable. BONES AND SOFT TISSUES: No bony abnormality is noted. IMPRESSION: 1. Increasing left basilar infiltrate and small effusion. 2. Prominent cardiac shadow with stable pacemaker. Electronically signed by: Oneil Devonshire MD 04/01/2024 01:04 AM EST RP Workstation: [id number]   DG Chest Port 1 View Result Date: 03/24/2024 CLINICAL DATA:  Respiratory distress. EXAM: PORTABLE CHEST 1 VIEW COMPARISON:  03/03/2024 FINDINGS: The cardio pericardial silhouette is enlarged. Retrocardiac atelectasis or infiltrate noted. Right lung clear. Single lead left-sided permanent pacemaker again noted. Telemetry leads overlie the chest. IMPRESSION: Retrocardiac atelectasis or infiltrate. Electronically Signed   By: Camellia Candle M.D.   On: 03/24/2024 07:22    Alm Schneider, DO  [facility]  If 7PM-7AM, please contact night-coverage [email or web address] Password [facility] 04/08/2024, 5:41 PM   LOS: 5 days   "

## 2024-04-08 NOTE — Progress Notes (Signed)
" °                                                                                                     °                                                   °  Daily Progress Note   Patient Name: Jay Campbell       Date: 04/08/2024 DOB: 1945-06-24  Age: 79 y.o. MRN#: [id number] Attending Physician: Evonnie Lenis, MD Primary Care Physician: [name], MD Admit Date: 04/03/2024  Reason for Initial Consultation/Follow-up: {Reason for Consult:23484}  Subjective: ***  Today, patient is lethargic but opens eyes. Denies any pain or symptoms.   Per nursing Marci S), no uncontrolled symptoms reported. No acute concerns currently. He has had little to no PO intake (0%) for her shift.   Chart review/care coordination:  Completed extensive chart review including EPIC notes, ***. Coordinated care with ****.   Attempted to call patient's son Traycen Goyer) via phone to provide update, no answer, left VM requesting return call.    Length of Stay: 5   Physical Exam          Vital Signs: BP (!) (P) 114/53 (BP Location: Left Arm)   Pulse (P) 60   Temp (P) 99 F (37.2 C) (Axillary)   Resp 18   SpO2 96%  SpO2: SpO2: 96 % O2 Device: O2 Device: Nasal Cannula O2 Flow Rate: O2 Flow Rate (L/min): 2.5 L/min      Palliative Assessment/Data:   Palliative Care Assessment & Plan   Patient Profile/Assessment:  ***   Recommendations/Plan: ***   Symptom management:  ***  Prognosis:  {Palliative Care Prognosis:23504}    Discharge Planning: {Palliative dispostion:23505}    Detailed review of medical records (labs, imaging, vital signs), medically appropriate exam, discussed with treatment team, counseling and education to patient, family, & staff, documenting clinical information, medication management, coordination of care   Total time:  I personally spent a total of *** minutes in the care of the patient today including {Time Based Coding:210964241}.    Billing based on MDM:  ***  {Problems Addressed:304933}  {Amount and/or Complexity of Ijuj:695065}  {Risks:304936}         Laymon CHRISTELLA Pinal, NP  Palliative Medicine Team Team phone # [phone]  Thank you for allowing the Palliative Medicine Team to assist in the care of this patient. Please utilize secure chat with additional questions, if there is no response within 30 minutes please call the above phone number.  Palliative Medicine Team providers are available by phone from 7am to 7pm daily and can be reached through the team cell phone.  Should this patient require assistance outside of these hours, please call the patient's attending physician.   "

## 2024-04-09 DIAGNOSIS — Z515 Encounter for palliative care: Secondary | ICD-10-CM | POA: Diagnosis not present

## 2024-04-09 MED ORDER — BIOTENE DRY MOUTH MT LIQD
15.0000 mL | Freq: Three times a day (TID) | OROMUCOSAL | Status: DC
  Administered 2024-04-09 – 2024-04-18 (×25): 15 mL via OROMUCOSAL

## 2024-04-09 NOTE — Plan of Care (Signed)

## 2024-04-09 NOTE — Progress Notes (Signed)
 "          PROGRESS NOTE  Jay Campbell FMW:[id number] DOB: [birth year] DOA: 04/03/2024 PCP: [name], MD  Brief History:  79 y.o. male with medical history significant for Alzheimer's dementia, history of agitation due to dementia, coronary artery disease, permanent atrial fibrillation previously on Eliquis , complete heart block status post pacemaker placement, chronic thrombocytopenia, type 2 diabetes, hypertension, hyperlipidemia, GERD, BPH, who presents to the ER via EMS from SNF due to shortness of breath and generalized weakness.  The patient was discharged yesterday after being admitted for sepsis secondary to pneumonia and acute hypoxic respiratory failure requiring 2 L nasal cannula.  Staff at SNF reported the patient was more short of breath and lethargic than baseline today.  EMS was activated.  He received albuterol  nebs en route via EMS.   In the ER, hypoxic with O2 saturation of 87% on 2 L nasal cannula.  The patient is alert and confused in the setting of dementia.  Frail-appearing with pressure wounds in heels.  He is minimally verbal and unable to provide a history.     A chest x-ray revealed increasing left basilar infiltrates and small effusion.  Prominent cardiac shadow with stable pacemaker.  Lactic acid 3.9.  There was concern for sepsis.  Subsequently, his CT chest abdomen pelvis without contrast revealed left lower lobe and lingular consolidation with patchy bilateral ground glass opacities more consistent with multifocal pneumonia.  Compressive atelectasis versus additional pneumonia in the right lower lobe.  Mild to moderate bilateral pleural effusions, right greater than left.  Cirrhotic liver morphology with moderate abdominal pelvic ascites and splenomegaly.  Gastric wall thickening may be related to portal gastropathy or gastritis.  Right upper lobe nephrolithiasis.  Coronary artery disease, aortic atherosclerosis.  Sigmoid diverticulosis.  Moderate stool burden in the  colon.   The patient received 1 dose of cefepime  and azithromycin  in the ER.  TRH, hospitalist service, was asked to admit for worsening pneumonia.  Subsequently, the patient's antibiotics were discontinued as the patient received a full course of antibiotics during his last hospitalization from 03/24/2024 to 03/31/2024.  He remained afebrile and hemodynamically stable. Unfortunately, the patient's mental status did not improve much.  He was noted to be dehydrated with a free water  deficit.  Sodium went up to 155.  He was started on IV hypotonic fluid.  Although his sodium did gradually improve, the patient remained somnolent with minimal to no p.o. intake.  He continued to have dysphagia.  The patient was seen by speech therapy.  MBS was performed and did not reveal aspiration; however, patient was felt to be at continued risk for aspiration secondary to his impaired cognition and deconditioning.  A dysphagia 1 diet was ultimately recommended.  Palliative medicine was consulted.  After meetings with the patient's family, the patient's son was ultimately agreeable to transition to full comfort measures after the patient was accepted to residential hospice.  A bed was not available at [facility].  The patient was admitted to the hospital for hospital hospice care until bed is available at [facility] house.  Palliative medicine continue to follow.  They spoke with patient's son and verified desire to transition to full comfort measures.   Assessment/Plan: Adult Failure to Thrive -- unfortunately, despite maximal medical interventions and recent prolonged hospitalization of 7 days he has bounced back into the hospital within 24 hours. -- Patient has significant dysphagia and unable to eat/drink enough to maintain body requirements for fluids and nutrition --  Patient was initially started on IV antibiotics and IV fluids. --He did not improve clinically.  He continued to be somnolent with very poor to no oral  intake -- continue DNR/DNI orders present on admission -Ultimately, patient's family agreed to transition to full comfort measures after the patient was accepted to residential hospice. - Patient now has been transitioned to general inpatient hospice and for comfort measures have been instituted. - added oral morphine  solution   Multifocal pneumonia, seen on CT scan -- Pneumonia was fully treated during previous hospitalization -- DC antibiotics -- Patient remained afebrile hemodynamically stable - Patient now has been transitioned to general inpatient hospice and for comfort measures have been instituted.   Severe Dysphagia -- SLP evaluation requested -- MBS done--no definitive aspiration -- Patient continued to remain high risk for aspiration -Dysphagia 1 diet ordered>> pleasure feeding -Oral intake remains very poor - Patient now has been transitioned to general inpatient hospice and for comfort measures have been instituted.   Bilateral mild to moderate pleural effusions, seen on CT scan -- Closely monitor volume status -- Secondary to hepatic hydrothorax -Cirrhotic liver noted on CT abdomen and pelvis   Chronic hypoxic respiratory failure secondary to the above -- Recently started on 2 L O2 supplementation during previous admission for pneumonia. -- Unfortunately supplemental oxygen  wasn't provided at facility -Stable on 2 to 3 L presently. - Patient now has been transitioned to general inpatient hospice and for comfort measures have been instituted.   Lactic acidosis -- Secondary to Hypoxia, hypoperfusion, volume depletion -- Lactic acid is trending down  -- Follow peripheral blood cultures x 2. -- IV fluid hydration  -- Sepsis has been ruled out   Elevated troponin, suspect demand ischemia secondary to the above History of coronary artery disease Aortic atherosclerosis High-sensitivity troponin 23, 26. No evidence of acute ischemia on twelve-lead EKG No chest pain  reported. Monitor on telemetry.   Chronic HFpEF Last 2D echo done on 05/09/2019 revealed LVEF 60 to 65% Presented with dyspnea, cardiomegaly, and bilateral pleural effusions -04/01/2024 echo EF 60 to 65%, no WMA, normal RVF - Patient now has been transitioned to general inpatient hospice and for comfort measures have been instituted.   Hypernatremia Speech therapy is consulted to rule out dysphagia Hypotonic IV fluid to correct free water  deficit --Patient now transition to full comfort measures   Hypokalemia -initially repleted -now on comfort measures   Gastric wall thickening, may be related to portal gastropathy or gastritis, seen on CT scan. IV PPI Protonix  40 mg daily initially No reported abdominal pain or GI bleed Avoid NSAIDs Patient may transition to full comfort measures   Generalized weakness He is chronically debilitated and this is unchanged Palliative consultation requested    Type 2 diabetes with hyperglycemia Last hemoglobin A1c 7.3 on 03/24/2024   Hypertension Resume home Lopressor  Closely monitor vital signs.   Alzheimer's dementia with history of agitation Resume home regimen. Aricept  held due to transient bradycardia. -now on comfort measures care   Permanent atrial fibrillation, no longer on Eliquis  due to chronic thrombocytopenia Currently rate controlled --- Patient now has been transitioned to general inpatient hospice and for comfort measures have been instituted.             Family Communication:   no Family at bedside   Consultants:  palliative   Code Status:  FULL COMFORT   DVT Prophylaxis:  FULL COMFORT     Procedures: As Listed in Progress Note Above   Antibiotics: none  Subjective: Patient is normal.  He is comfortable.  There is no reports of vomiting, uncontrolled pain, respiratory distress.  Objective: Vitals:   04/07/24 2014 04/08/24 0500 04/08/24 1300 04/08/24 2258  BP: (!) 135/41 (!) 114/53 (!) 160/68 (!)  167/73  Pulse: 60 60 63 63  Resp: 18 20 16 18   Temp: (!) 100.6 F (38.1 C) 99 F (37.2 C) 98 F (36.7 C) 98 F (36.7 C)  TempSrc: Axillary Axillary Axillary Axillary  SpO2: 96% 100% 100% 100%    Intake/Output Summary (Last 24 hours) at 04/09/2024 1819 Last data filed at 04/09/2024 9381 Gross per 24 hour  Intake --  Output 400 ml  Net -400 ml   Weight change:  Exam:  General:  Pt is somnolent, does not follos commands appropriately, not in acute distress HEENT: No icterus, No thrush, No neck mass, [place]/AT Cardiovascular: RRR, S1/S2, no rubs, no gallops Respiratory: Bibasilar rales.  No wheezing Abdomen: Soft/+BS, non tender, non distended, no guarding Extremities: No edema, No lymphangitis, No petechiae, No rashes, no synovitis   Data Reviewed: I have personally reviewed following labs and imaging studies Basic Metabolic Panel: Recent Labs  Lab 04/03/24 0450  NA 146*  K 3.3*  CL 114*  CO2 22  GLUCOSE 119*  BUN 35*  CREATININE 0.99  CALCIUM 7.2*   Liver Function Tests: No results for input(s): AST, ALT, ALKPHOS, BILITOT, PROT, ALBUMIN in the last 168 hours. No results for input(s): LIPASE, AMYLASE in the last 168 hours. No results for input(s): AMMONIA in the last 168 hours. Coagulation Profile: No results for input(s): INR, PROTIME in the last 168 hours. CBC: No results for input(s): WBC, NEUTROABS, HGB, HCT, MCV, PLT in the last 168 hours. Cardiac Enzymes: No results for input(s): CKTOTAL, CKMB, CKMBINDEX, TROPONINI in the last 168 hours. BNP: Invalid input(s): POCBNP CBG: Recent Labs  Lab 04/02/24 2000 04/03/24 0000 04/03/24 0407 04/03/24 0731 04/03/24 1123  GLUCAP 179* 97 108* 134* 106*   HbA1C: No results for input(s): HGBA1C in the last 72 hours. Urine analysis:    Component Value Date/Time   COLORURINE COLORLESS (A) 04/01/2024 0658   APPEARANCEUR CLEAR 04/01/2024 0658   LABSPEC 1.008 04/01/2024  0658   PHURINE 5.0 04/01/2024 0658   GLUCOSEU NEGATIVE 04/01/2024 0658   HGBUR MODERATE (A) 04/01/2024 0658   BILIRUBINUR NEGATIVE 04/01/2024 0658   KETONESUR NEGATIVE 04/01/2024 0658   PROTEINUR NEGATIVE 04/01/2024 0658   NITRITE NEGATIVE 04/01/2024 0658   LEUKOCYTESUR NEGATIVE 04/01/2024 0658   Sepsis Labs: @LABRCNTIP (procalcitonin:4,lacticidven:4) ) Recent Results (from the past 240 hours)  Blood Culture (routine x 2)     Status: None   Collection Time: 04/01/24 12:50 AM   Specimen: BLOOD RIGHT FOREARM  Result Value Ref Range Status   Specimen Description BLOOD RIGHT FOREARM  Final   Special Requests   Final    BOTTLES DRAWN AEROBIC AND ANAEROBIC Blood Culture adequate volume   Culture   Final    NO GROWTH 5 DAYS Performed at [facility], [address], [place], [place] [address]    Report Status 04/06/2024 FINAL  Final  Blood Culture (routine x 2)     Status: None   Collection Time: 04/01/24  1:39 AM   Specimen: Right Antecubital; Blood  Result Value Ref Range Status   Specimen Description RIGHT ANTECUBITAL  Final   Special Requests   Final    BOTTLES DRAWN AEROBIC AND ANAEROBIC Blood Culture adequate volume   Culture   Final  NO GROWTH 5 DAYS Performed at [facility]: [facility], [address], [place], [place] [address]    Report Status 04/06/2024 FINAL  Final     Scheduled Meds:  antiseptic oral rinse  15 mL Mouth Rinse TID   Continuous Infusions:  Procedures/Studies: DG Swallowing Func-Speech Pathology Result Date: 04/02/2024 Table formatting from the original result was not included. Modified Barium Swallow Study Patient Details Name: KANEN MOTTOLA MRN: [id number] Date of Birth: January 03, 1946 Today's Date: 04/02/2024 HPI/PMH: HPI: ALFONSA VAILE is a 79 y.o. male with medical history significant for Alzheimer's dementia, history of agitation due to dementia, coronary artery disease, permanent atrial fibrillation previously on Eliquis , complete heart block status post  pacemaker placement, chronic thrombocytopenia, type 2 diabetes, hypertension, hyperlipidemia, GERD, BPH, who presents to the ER via EMS from SNF due to shortness of breath and generalized weakness. The patient was discharged yesterday after being admitted for sepsis secondary to pneumonia and acute hypoxic respiratory failure requiring 2 L nasal cannula. Staff at SNF reported the patient was more short of breath and lethargic than baseline today. EMS was activated. He received albuterol  nebs en route via EMS. In the ER, hypoxic with O2 saturation of 87% on 2 L nasal cannula. The patient is alert and confused in the setting of dementia. Frail-appearing with pressure wounds in heels. He is minimally verbal and unable to provide a history. A chest x-ray revealed increasing left basilar infiltrates and small effusion. Prominent cardiac shadow with stable pacemaker. Lactic acid 3.9. There was concern for sepsis. Subsequently, his CT chest abdomen pelvis without contrast revealed left lower lobe and lingular consolidation with patchy bilateral ground glass opacities more consistent with multifocal pneumonia. Compressive atelectasis versus additional pneumonia in the right lower lobe. Mild to moderate bilateral pleural effusions, right greater than left. Pt was seen by SLP service on Thursday and was discharged over the weekend. BSE completed and pt exhibiting overt s/s of aspiration and placed NPO. MBS ordered to determine safest diet and assess swallow function objectively. Clinical Impression: Clinical Impression: Pt presents with mild oropharyngeal dysphagia compounded by cognitive-based dysphagia c/b escape to lateral buccal cavity/floor of mouth and slow, prolonged mastication with solids.  Delayed initiation of tongue motion (oral holding) and delayed initiation of the swallow (typical for age) at the level of the valleculae throughout study.  Diminished pharyngeal stripping wave and partial epiglottic inflection  observed potentially d/t bulging PPW/?cervical osteophytes.  Decreased tongue base retraction resulting in diffuse residue within the vallecular space, pyriform sinuses and posterior pharyngeal wall intermittently during study.  It should be noted pt exhibited xerostomia prior to any po intake d/t NPO status which may have contributed to amount of residue within pharynx and other structures.  As the study progressed and compensatory strategies instilled including liquid wash, alternating solids/liquids and multiple effortful swallows, residue cleared to mild-insignificant amount within affected structures.  Partial distension/obstruction of flow observed in UES d/t prominent CP bar presence.  NO aspiration and/or penetration observed throughout study, but d/t pt's impaired cognition and deconditioning/dysphagia, he is at risk for aspiration if swallow strategies/A with meals/full supervision is not followed.  Recommend initiating a Dysphagia 1(puree)/thin liquid diet via tsp amounts with FULL supervision/A with meals.  ST will f/u for dysphagia tx/education during remainder of acute stay.  ST should f/u at next venue of care. Factors that may increase risk of adverse event in presence of aspiration Noe & Lianne 2021): Factors that may increase risk of adverse event in presence of aspiration (  Palmer & Lianne 2021): Poor general health and/or compromised immunity; Reduced cognitive function; Frail or deconditioned; Weak cough; Aspiration of thick, dense, and/or acidic materials Recommendations/Plan: Swallowing Evaluation Recommendations Swallowing Evaluation Recommendations Recommendations: PO diet PO Diet Recommendation: Dysphagia 1 (Pureed); Thin liquids (Level 0) Liquid Administration via: Spoon Medication Administration: Crushed with puree Supervision: Full assist for feeding; Full supervision/cueing for swallowing strategies Swallowing strategies  : Slow rate; Small bites/sips; Minimize environmental  distractions; effortful swallow; Multiple dry swallows after each bite/sip; Follow solids with liquids Postural changes: Position pt fully upright for meals; Stay upright 30-60 min after meals Oral care recommendations: Oral care BID (2x/day); Staff/trained caregiver to provide oral care Treatment Plan Treatment Plan Treatment recommendations: Therapy as outlined in treatment plan below Follow-up recommendations: Skilled nursing-short term rehab (<3 hours/day) Functional status assessment: Patient has had a recent decline in their functional status and demonstrates the ability to make significant improvements in function in a reasonable and predictable amount of time. Treatment frequency: Min 2x/week Treatment duration: 1 week Interventions: Aspiration precaution training; Compensatory techniques; Patient/family education; Trials of upgraded texture/liquids; Diet toleration management by SLP Recommendations Recommendations for follow up therapy are one component of a multi-disciplinary discharge planning process, led by the attending physician.  Recommendations may be updated based on patient status, additional functional criteria and insurance authorization. Assessment: Orofacial Exam: Orofacial Exam Oral Cavity: Oral Hygiene: Xerostomia Oral Cavity - Dentition: Poor condition; Missing dentition Orofacial Anatomy: WFL Oral Motor/Sensory Function: Generalized oral weakness Anatomy: Anatomy: WFL Boluses Administered: Boluses Administered Boluses Administered: Thin liquids (Level 0); Mildly thick liquids (Level 2, nectar thick); Moderately thick liquids (Level 3, honey thick); Puree; Solid  Oral Impairment Domain: Oral Impairment Domain Lip Closure: No labial escape Tongue control during bolus hold: Escape to lateral buccal cavity/floor of mouth Bolus preparation/mastication: Slow prolonged chewing/mashing with complete recollection Bolus transport/lingual motion: Delayed initiation of tongue motion (oral holding)  Oral residue: Trace residue lining oral structures Location of oral residue : Tongue Initiation of pharyngeal swallow : Valleculae  Pharyngeal Impairment Domain: Pharyngeal Impairment Domain Soft palate elevation: No bolus between soft palate (SP)/pharyngeal wall (PW) Laryngeal elevation: Complete superior movement of thyroid  cartilage with complete approximation of arytenoids to epiglottic petiole Anterior hyoid excursion: Complete anterior movement Epiglottic movement: Partial inversion Laryngeal vestibule closure: Complete, no air/contrast in laryngeal vestibule Pharyngeal stripping wave : Present - diminished Pharyngeal contraction (A/P view only): N/A Pharyngoesophageal segment opening: Partial distention/partial duration, partial obstruction of flow Tongue base retraction: Narrow column of contrast or air between tongue base and PPW Pharyngeal residue: Collection of residue within or on pharyngeal structures Location of pharyngeal residue: Diffuse (>3 areas)  Esophageal Impairment Domain: Esophageal Impairment Domain Esophageal clearance upright position: Complete clearance, esophageal coating Pill: Pill Consistency administered: -- (n/a) Penetration/Aspiration Scale Score: Penetration/Aspiration Scale Score 1.  Material does not enter airway: Thin liquids (Level 0); Mildly thick liquids (Level 2, nectar thick); Moderately thick liquids (Level 3, honey thick); Puree; Solid Compensatory Strategies: Compensatory Strategies Compensatory strategies: Yes Effortful swallow: Effective Effective Effortful Swallow: Thin liquid (Level 0); Mildly thick liquid (Level 2, nectar thick); Puree Multiple swallows: Effective Effective Multiple Swallows: Thin liquid (Level 0); Mildly thick liquid (Level 2, nectar thick); Puree Liquid wash: Effective Effective Liquid Wash: Thin liquid (Level 0); Mildly thick liquid (Level 2, nectar thick); Puree; Solid   General Information: Caregiver present: No  Diet Prior to this Study: NPO    Temperature : Normal   Respiratory Status: WFL   Supplemental O2: Nasal cannula (3L)  History of Recent Intubation: No  Behavior/Cognition: Alert; Cooperative; Distractible; Requires cueing Self-Feeding Abilities: Dependent for feeding Baseline vocal quality/speech: Hypophonia/low volume; Dysphonic Volitional Cough: Able to elicit Volitional Swallow: Unable to elicit Exam Limitations: No limitations Goal Planning: Prognosis for improved oropharyngeal function: Good Barriers to Reach Goals: Cognitive deficits No data recorded Patient/Family Stated Goal: n/a Consulted and agree with results and recommendations: Patient; Pt unable/family or caregiver not available; Nurse; Physician Pain: Pain Assessment Pain Assessment: Faces Faces Pain Scale: 2 Breathing: 0 Negative Vocalization: 1 Facial Expression: 1 Body Language: 0 Consolability: 0 PAINAD Score: 0 Pain Location: generalized with movement Pain Descriptors / Indicators: Discomfort; Grimacing Pain Intervention(s): Limited activity within patient's tolerance; Repositioned End of Session: Start Time:SLP Start Time (ACUTE ONLY): 1014 Stop Time: SLP Stop Time (ACUTE ONLY): 1050 Time Calculation:SLP Time Calculation (min) (ACUTE ONLY): 36 min Charges: SLP Evaluations $ SLP Speech Visit: 1 Visit SLP Evaluations $BSS Swallow: 1 Procedure $MBS Swallow: 1 Procedure SLP visit diagnosis: SLP Visit Diagnosis: Dysphagia, oropharyngeal phase (R13.12) Past Medical History: Past Medical History: Diagnosis Date  Alzheimer's dementia (HCC)   Anxiety   Atrial fibrillation (HCC)   Permanent  Coronary artery disease   Mild nonobstructive 1/12  DM (diabetes mellitus) (HCC)   GERD (gastroesophageal reflux disease)   Gout   Hearing disorder, cochlear   Hiatal hernia   HLD (hyperlipidemia)   HTN (hypertension)   S/P AV nodal ablation   s/p SJM PPM; gen change 02-01-13 by Dr Kelsie  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  Warfarin anticoagulation  Past Surgical History: Past Surgical  History: Procedure Laterality Date  CATARACT EXTRACTION Right   COCHLEAR IMPLANT Right   HEMORRHOID BANDING    KNEE ARTHROSCOPY Right 1992  Dr Brenna   PACEMAKER GENERATOR CHANGE Bilateral 02/01/2013  Procedure: PACEMAKER GENERATOR CHANGE;  Surgeon: Lynwood JONETTA Kelsie, MD;  Location: [facility] CATH [facility];  Service: Cardiovascular;  Laterality: Bilateral;  PACEMAKER PLACEMENT  2002; 2014  SJM implanted by Dr Waddell with AV nodal ablation performed; gen change to [employer] SR RF by Dr Kelsie 02-01-13  RETINAL LASER PROCEDURE Left   SLT LASER APPLICATION Left 12/14/2015  Procedure: SLT LASER APPLICATION;  Surgeon: Dow JULIANNA Burke, MD;  Location: [facility];  Service: Ophthalmology;  Laterality: Left; Pat Adams,M.S.,CCC-SLP 04/02/2024, 11:23 AM  ECHOCARDIOGRAM COMPLETE Result Date: 04/01/2024    ECHOCARDIOGRAM REPORT   Patient Name:   BILLAL ROLLO Date of Exam: 04/01/2024 Medical Rec #:  [id number]        Height:       65.0 in Accession #:    [id number]       Weight:       175.7 lb Date of Birth:  1945/09/24        BSA:          1.872 m Patient Age:    78 years         BP:           158/69 mmHg Patient Gender: M                HR:           63 bpm. Exam Location:  [facility] Procedure: 2D Echo, Cardiac Doppler and Color Doppler (Both Spectral and Color            Flow Doppler were utilized during procedure). Indications:    CHF-Acute Diastolic I50.31  History:        Patient has prior history of Echocardiogram examinations, most  recent 05/09/2019. CAD, Pacemaker, Arrythmias:Atrial Fibrillation                 and Complete heart block (HCC); Risk Factors:Hypertension,                 Diabetes, Dyslipidemia and Non-Smoker. Alzheimer's dementia                 (HCC) (From Hx).  Sonographer:    Aida Pizza RCS Referring Phys: [id number] TERRY LOISE HURST  Sonographer Comments: Image acquisition challenging due to patient behavioral factors. IMPRESSIONS  1. Left ventricular ejection fraction, by estimation, is 60 to 65%. The left  ventricle has normal function. The left ventricle has no regional wall motion abnormalities. There is mild left ventricular hypertrophy. Left ventricular diastolic parameters are indeterminate.  2. Right ventricular systolic function is normal. The right ventricular size is mildly enlarged. There is normal pulmonary artery systolic pressure.  3. Left atrial size was severely dilated.  4. Right atrial size was severely dilated.  5. The mitral valve is abnormal. Mild to moderate mitral valve regurgitation. No evidence of mitral stenosis. Severe mitral annular calcification.  6. The aortic valve is tricuspid. There is mild calcification of the aortic valve. There is mild thickening of the aortic valve. Aortic valve regurgitation is not visualized. No aortic stenosis is present. Aortic valve mean gradient measures 6.0 mmHg.  7. Aortic dilatation noted. There is borderline dilatation of the aortic root, measuring 39 mm.  8. The inferior vena cava is normal in size with <50% respiratory variability, suggesting right atrial pressure of 8 mmHg. Comparison(s): No significant change from prior study. FINDINGS  Left Ventricle: Left ventricular ejection fraction, by estimation, is 60 to 65%. The left ventricle has normal function. The left ventricle has no regional wall motion abnormalities. Strain was performed and the global longitudinal strain is indeterminate. The left ventricular internal cavity size was normal in size. There is mild left ventricular hypertrophy. Left ventricular diastolic parameters are indeterminate. Right Ventricle: The right ventricular size is mildly enlarged. No increase in right ventricular wall thickness. Right ventricular systolic function is normal. There is normal pulmonary artery systolic pressure. The tricuspid regurgitant velocity is 2.34  m/s, and with an assumed right atrial pressure of 8 mmHg, the estimated right ventricular systolic pressure is 29.9 mmHg. Left Atrium: Left atrial size was  severely dilated. Right Atrium: Right atrial size was severely dilated. Pericardium: There is no evidence of pericardial effusion. Mitral Valve: The mitral valve is abnormal. Severe mitral annular calcification. Mild to moderate mitral valve regurgitation. No evidence of mitral valve stenosis. Tricuspid Valve: The tricuspid valve is normal in structure. Tricuspid valve regurgitation is mild . No evidence of tricuspid stenosis. Aortic Valve: The aortic valve is tricuspid. There is mild calcification of the aortic valve. There is mild thickening of the aortic valve. Aortic valve regurgitation is not visualized. No aortic stenosis is present. Aortic valve mean gradient measures 6.0 mmHg. Aortic valve peak gradient measures 11.3 mmHg. Aortic valve area, by VTI measures 1.90 cm. Pulmonic Valve: The pulmonic valve was normal in structure. Pulmonic valve regurgitation is trivial. No evidence of pulmonic stenosis. Aorta: Aortic dilatation noted. There is borderline dilatation of the aortic root, measuring 39 mm. Venous: The inferior vena cava is normal in size with less than 50% respiratory variability, suggesting right atrial pressure of 8 mmHg. IAS/Shunts: No atrial level shunt detected by color flow Doppler. Additional Comments: 3D was performed not requiring image post processing on an  independent workstation and was indeterminate.  LEFT VENTRICLE PLAX 2D LVIDd:         4.80 cm LVIDs:         3.00 cm LV PW:         1.20 cm LV IVS:        1.20 cm LVOT diam:     2.10 cm LV SV:         56 LV SV Index:   30 LVOT Area:     3.46 cm  RIGHT VENTRICLE RV S prime:     14.30 cm/s TAPSE (M-mode): 2.7 cm LEFT ATRIUM              Index        RIGHT ATRIUM           Index LA diam:        5.90 cm  3.15 cm/m   RA Area:     38.35 cm LA Vol (A2C):   138.5 ml 73.98 ml/m  RA Volume:   150.00 ml 80.12 ml/m LA Vol (A4C):   182.0 ml 97.21 ml/m LA Biplane Vol: 165.0 ml 88.13 ml/m  AORTIC VALVE AV Area (Vmax):    1.64 cm AV Area  (Vmean):   1.83 cm AV Area (VTI):     1.90 cm AV Vmax:           168.00 cm/s AV Vmean:          106.000 cm/s AV VTI:            0.294 m AV Peak Grad:      11.3 mmHg AV Mean Grad:      6.0 mmHg LVOT Vmax:         79.40 cm/s LVOT Vmean:        56.100 cm/s LVOT VTI:          0.161 m LVOT/AV VTI ratio: 0.55  AORTA Ao Root diam: 3.90 cm MITRAL VALVE                TRICUSPID VALVE MV Area (PHT): 3.27 cm     TR Peak grad:   21.9 mmHg MV Decel Time: 232 msec     TR Vmax:        234.00 cm/s MR Peak grad: 119.7 mmHg MR Mean grad: 75.0 mmHg     SHUNTS MR Vmax:      547.00 cm/s   Systemic VTI:  0.16 m MR Vmean:     401.0 cm/s    Systemic Diam: 2.10 cm MV E velocity: 107.00 cm/s MV A velocity: 33.30 cm/s MV E/A ratio:  3.21 Vishnu Priya Mallipeddi Electronically signed by Diannah Late Mallipeddi Signature Date/Time: 04/01/2024/2:10:59 PM    Final    CT CHEST ABDOMEN PELVIS WO CONTRAST Result Date: 04/01/2024 EXAM: CT CHEST, ABDOMEN AND PELVIS WITHOUT CONTRAST 04/01/2024 01:25:10 AM TECHNIQUE: CT of the chest, abdomen and pelvis was performed without the administration of intravenous contrast. Multiplanar reformatted images are provided for review. Automated exposure control, iterative reconstruction, and/or weight based adjustment of the mA/kV was utilized to reduce the radiation dose to as low as reasonably achievable. COMPARISON: 02/07/2023. CLINICAL HISTORY: Sepsis. FINDINGS: CHEST: MEDIASTINUM AND LYMPH NODES: Cardiomegaly. Pacer wires in the right heart. Coronary artery aortic atherosclerosis. The central airways are clear. No mediastinal, hilar or axillary lymphadenopathy. LUNGS AND PLEURA: Mild to moderate bilateral pleural effusions, right greater than left. Consolidation with air bronchograms in the left lower lobe and posterior lingula concerning for pneumonia.  Compressive atelectasis or pneumonia in the right lower lobe. Patchy ground glass airspace opacities also likely related to pneumonia. No pneumothorax.  ABDOMEN AND PELVIS: LIVER: Mild nodular contours of the liver suggest cirrhosis. GALLBLADDER AND BILE DUCTS: High-density material within the gallbladder could reflect small stones or contrast material. No biliary ductal dilatation. SPLEEN: The spleen is mildly enlarged at 13 cm craniocaudal length. PANCREAS: No acute abnormality. ADRENAL GLANDS: No acute abnormality. KIDNEYS, URETERS AND BLADDER: 1.6 cm stone in the upper pole of the right kidney. No hydronephrosis. No perinephric or periureteral stranding. Urinary bladder is unremarkable. GI AND BOWEL: The stomach is decompressed but appears to be thick-walled. This could be related to portal gastropathy or gastritis. Sigmoid diverticulosis. No active diverticulitis. Moderate stool burden throughout the colon. There is no bowel obstruction. REPRODUCTIVE ORGANS: Prostate enlargement. PERITONEUM AND RETROPERITONEUM: Moderate free fluid in the abdomen and pelvis. No free air. VASCULATURE: Aorta is normal in caliber. Aortoiliac catheter sclerosis. ABDOMINAL AND PELVIS LYMPH NODES: No lymphadenopathy. BONES AND SOFT TISSUES: Stable severe compression fracture at L1. Diffuse osteopenia. No focal soft tissue abnormality. IMPRESSION: 1. Left lower lobe and lingular consolidation with patchy bilateral ground-glass opacities, most consistent with multifocal pneumonia. 2. Compressive atelectasis versus additional pneumonia in the right lower lobe. 3. Mild to moderate bilateral pleural effusions, right greater than left. 4. Cirrhotic liver morphology with moderate abdominopelvic ascites and splenomegaly . 5. Gastric wall thickening may be related to portal gastropathy or gastritis . 6. Right upper pole nephrolithiasis . 7. Coronary artery disease, aortic atherosclerosis. 8. Sigmoid diverticulosis. Moderate stool burden in the colon. Electronically signed by: Franky Crease MD 04/01/2024 01:36 AM EST RP Workstation: [id number]   DG Chest Port 1 View Result Date:  04/01/2024 EXAM: 1 VIEW(S) XRAY OF THE CHEST 04/01/2024 01:00:00 AM COMPARISON: Comparison from 03/24/2024. CLINICAL HISTORY: Increased lethargy and shortness of breath. FINDINGS: LUNGS AND PLEURA: Increasing left basilar infiltrate. Small left pleural effusion. No pneumothorax. HEART AND MEDIASTINUM: The cardiac shadow is prominent. The pacemaker is again seen and stable. BONES AND SOFT TISSUES: No bony abnormality is noted. IMPRESSION: 1. Increasing left basilar infiltrate and small effusion. 2. Prominent cardiac shadow with stable pacemaker. Electronically signed by: Oneil Devonshire MD 04/01/2024 01:04 AM EST RP Workstation: [id number]   DG Chest Port 1 View Result Date: 03/24/2024 CLINICAL DATA:  Respiratory distress. EXAM: PORTABLE CHEST 1 VIEW COMPARISON:  03/03/2024 FINDINGS: The cardio pericardial silhouette is enlarged. Retrocardiac atelectasis or infiltrate noted. Right lung clear. Single lead left-sided permanent pacemaker again noted. Telemetry leads overlie the chest. IMPRESSION: Retrocardiac atelectasis or infiltrate. Electronically Signed   By: Camellia Candle M.D.   On: 03/24/2024 07:22    Alm Schneider, DO  [facility]  If 7PM-7AM, please contact night-coverage [email or web address] Password [email or web address] 04/09/2024, 6:20 PM   LOS: 6 days   "

## 2024-04-09 NOTE — TOC Progression Note (Signed)
 Transition of Care [facility]) - Progression Note    Patient Details  Name: CHUCK CABAN MRN: [id number] Date of Birth: 18-Aug-1945  Transition of Care [facility]) CM/SW Contact  Sharlyne Stabs, RN Phone Number: 04/09/2024, 9:24 AM  Clinical Narrative:   Patient remains comfort care, GIP with Duaine, IPCM following and check for open bed every morning. No bed available at this time.

## 2024-04-10 DIAGNOSIS — Z515 Encounter for palliative care: Secondary | ICD-10-CM | POA: Diagnosis not present

## 2024-04-10 NOTE — Progress Notes (Signed)
 "          PROGRESS NOTE  Jay Campbell FMW:[id number] DOB: 12-Oct-1945 DOA: 04/03/2024 PCP: [name], MD  Brief History:  79 y.o. male with medical history significant for Alzheimer's dementia, history of agitation due to dementia, coronary artery disease, permanent atrial fibrillation previously on Eliquis , complete heart block status post pacemaker placement, chronic thrombocytopenia, type 2 diabetes, hypertension, hyperlipidemia, GERD, BPH, who presents to the ER via EMS from SNF due to shortness of breath and generalized weakness.  The patient was discharged yesterday after being admitted for sepsis secondary to pneumonia and acute hypoxic respiratory failure requiring 2 L nasal cannula.  Staff at SNF reported the patient was more short of breath and lethargic than baseline today.  EMS was activated.  He received albuterol  nebs en route via EMS.   In the ER, hypoxic with O2 saturation of 87% on 2 L nasal cannula.  The patient is alert and confused in the setting of dementia.  Frail-appearing with pressure wounds in heels.  He is minimally verbal and unable to provide a history.     A chest x-ray revealed increasing left basilar infiltrates and small effusion.  Prominent cardiac shadow with stable pacemaker.  Lactic acid 3.9.  There was concern for sepsis.  Subsequently, his CT chest abdomen pelvis without contrast revealed left lower lobe and lingular consolidation with patchy bilateral ground glass opacities more consistent with multifocal pneumonia.  Compressive atelectasis versus additional pneumonia in the right lower lobe.  Mild to moderate bilateral pleural effusions, right greater than left.  Cirrhotic liver morphology with moderate abdominal pelvic ascites and splenomegaly.  Gastric wall thickening may be related to portal gastropathy or gastritis.  Right upper lobe nephrolithiasis.  Coronary artery disease, aortic atherosclerosis.  Sigmoid diverticulosis.  Moderate stool burden in the  colon.   The patient received 1 dose of cefepime  and azithromycin  in the ER.  TR[facility], hospitalist service, was asked to admit for worsening pneumonia.  Subsequently, the patient's antibiotics were discontinued as the patient received a full course of antibiotics during his last hospitalization from 03/24/2024 to 03/31/2024.  He remained afebrile and hemodynamically stable. Unfortunately, the patient's mental status did not improve much.  He was noted to be dehydrated with a free water  deficit.  Sodium went up to 155.  He was started on IV hypotonic fluid.  Although his sodium did gradually improve, the patient remained somnolent with minimal to no p.o. intake.  He continued to have dysphagia.  The patient was seen by speech therapy.  MBS was performed and did not reveal aspiration; however, patient was felt to be at continued risk for aspiration secondary to his impaired cognition and deconditioning.  A dysphagia 1 diet was ultimately recommended.  Palliative medicine was consulted.  After meetings with the patient's family, the patient's son was ultimately agreeable to transition to full comfort measures after the patient was accepted to residential hospice.   A bed was not available at [facility].  The patient was admitted to the hospital for hospital hospice care until bed is available at [facility] house.  Palliative medicine continue to follow.  They spoke with patient's son and verified desire to transition to full comfort measures.   Patient is stable and at the moment waiting for bed availability at the [place] house to continue symptomatic management and hospice care.  Assessment/Plan: Adult Failure to Thrive -- unfortunately, despite maximal medical interventions and recent prolonged hospitalization of 7 days he has bounced back into  the hospital within 24 hours. -- Patient has significant dysphagia and unable to eat/drink enough to maintain body requirements for fluids and nutrition -- Patient was  initially started on IV antibiotics and IV fluids. --He did not improve clinically.  He continued to be somnolent with very poor to no oral intake -- continue DNR/DNI orders present on admission -Ultimately, patient's family agreed to transition to full comfort measures after the patient was accepted to residential hospice. - Patient now has been transitioned to general inpatient hospice and for comfort measures have been instituted. - added oral morphine  solution   Multifocal pneumonia, seen on CT scan -- Pneumonia was fully treated during previous hospitalization -- DC antibiotics -- Patient remained afebrile hemodynamically stable - Patient now has been transitioned to general inpatient hospice and for comfort measures have been instituted.   Severe Dysphagia -- SLP evaluation requested -- MBS done--no definitive aspiration -- Patient continued to remain high risk for aspiration -Dysphagia 1 diet ordered>> pleasure feeding -Oral intake remains very poor - Patient now has been transitioned to general inpatient hospice and for comfort measures have been instituted.   Bilateral mild to moderate pleural effusions, seen on CT scan -- Closely monitor volume status -- Secondary to hepatic hydrothorax -Cirrhotic liver noted on CT abdomen and pelvis   Chronic hypoxic respiratory failure secondary to the above -- Recently started on 2 L O2 supplementation during previous admission for pneumonia. -- Unfortunately supplemental oxygen  wasn't provided at facility -Stable on 2 to 3 L presently. - Patient now has been transitioned to general inpatient hospice and for comfort measures have been instituted.   Lactic acidosis -- Secondary to Hypoxia, hypoperfusion, volume depletion -- Lactic acid is trending down  -- Follow peripheral blood cultures x 2. -- IV fluid hydration  -- Sepsis has been ruled out   Elevated troponin, suspect demand ischemia secondary to the above History of coronary  artery disease Aortic atherosclerosis High-sensitivity troponin 23, 26. No evidence of acute ischemia on twelve-lead EKG No chest pain reported. Monitor on telemetry.   Chronic HFpEF Last 2D echo done on 05/09/2019 revealed LVEF 60 to 65% Presented with dyspnea, cardiomegaly, and bilateral pleural effusions -04/01/2024 echo EF 60 to 65%, no WMA, normal RVF - Patient now has been transitioned to general inpatient hospice and for comfort measures have been instituted.   Hypernatremia Speech therapy is consulted to rule out dysphagia Hypotonic IV fluid to correct free water  deficit --Patient now transition to full comfort measures   Hypokalemia -initially repleted -now on comfort measures   Gastric wall thickening, may be related to portal gastropathy or gastritis, seen on CT scan. IV PPI Protonix  40 mg daily initially No reported abdominal pain or GI bleed Avoid NSAIDs Patient may transition to full comfort measures   Generalized weakness He is chronically debilitated and this is unchanged Palliative consultation requested    Type 2 diabetes with hyperglycemia Last hemoglobin A1c 7.3 on 03/24/2024   Hypertension Resume home Lopressor  Closely monitor vital signs.   Alzheimer's dementia with history of agitation Resume home regimen. Aricept  held due to transient bradycardia. -now on comfort measures care   Permanent atrial fibrillation, no longer on Eliquis  due to chronic thrombocytopenia Currently rate controlled --- Patient now has been transitioned to general inpatient hospice and for comfort measures have been instituted.     Family Communication:   no Family at bedside   Consultants:  palliative   Code Status:  FULL COMFORT   DVT Prophylaxis:  FULL  COMFORT     Procedures: As Listed in Progress Note Above   Antibiotics: none  Subjective: In no major distress; intermittent somnolence reported.  Flat affect appreciated.  Objective: Vitals:   04/08/24  2258 04/09/24 2014 04/10/24 0842 04/10/24 1357  BP: (!) 167/73 (!) 149/80 (!) 149/76 127/62  Pulse: 63 (!) 57 63 66  Resp: 18 18 19 18   Temp: 98 F (36.7 C) 97.7 F (36.5 C) 98.3 F (36.8 C) (!) 97.4 F (36.3 C)  TempSrc: Axillary Axillary Axillary Axillary  SpO2: 100% 96% 100% 94%   No intake or output data in the 24 hours ending 04/10/24 1735  Weight change:   Exam: General exam: Afebrile; in no major distress.  Intermittently somnolent. Respiratory system: No using accessory muscles. Cardiovascular system: Rate controlled, no rubs or gallops. Gastrointestinal system: Abdomen is nondistended, soft and nontender. No organomegaly or masses felt. Normal bowel sounds heard. Central nervous system:  No focal neurological deficits. Extremities: No cyanosis or clubbing. Skin: No petechiae. Psychiatry: Flat affect.  Data Reviewed: I have personally reviewed following labs and imaging studies  Basic Metabolic Panel: No results for input(s): NA, K, CL, CO2, GLUCOSE, BUN, CREATININE, CALCIUM, MG, PHOS in the last 168 hours.  HbA1C: No results for input(s): HGBA1C in the last 72 hours.  Urine analysis:    Component Value Date/Time   COLORURINE COLORLESS (A) 04/01/2024 0658   APPEARANCEUR CLEAR 04/01/2024 0658   LABSPEC 1.008 04/01/2024 0658   PHURINE 5.0 04/01/2024 0658   GLUCOSEU NEGATIVE 04/01/2024 0658   HGBUR MODERATE (A) 04/01/2024 0658   BILIRUBINUR NEGATIVE 04/01/2024 0658   KETONESUR NEGATIVE 04/01/2024 0658   PROTEINUR NEGATIVE 04/01/2024 0658   NITRITE NEGATIVE 04/01/2024 0658   LEUKOCYTESUR NEGATIVE 04/01/2024 0658   Sepsis Labs:  Recent Results (from the past 240 hours)  Blood Culture (routine x 2)     Status: None   Collection Time: 04/01/24 12:50 AM   Specimen: BLOOD RIGHT FOREARM  Result Value Ref Range Status   Specimen Description BLOOD RIGHT FOREARM  Final   Special Requests   Final    BOTTLES DRAWN AEROBIC AND ANAEROBIC Blood  Culture adequate volume   Culture   Final    NO GROWTH 5 DAYS Performed at [facility], [address], [place], [place] [address]    Report Status 04/06/2024 FINAL  Final  Blood Culture (routine x 2)     Status: None   Collection Time: 04/01/24  1:39 AM   Specimen: Right Antecubital; Blood  Result Value Ref Range Status   Specimen Description RIGHT ANTECUBITAL  Final   Special Requests   Final    BOTTLES DRAWN AEROBIC AND ANAEROBIC Blood Culture adequate volume   Culture   Final    NO GROWTH 5 DAYS Performed at [facility], [address], [place], [place] [address]    Report Status 04/06/2024 FINAL  Final     Scheduled Meds:  antiseptic oral rinse  15 mL Mouth Rinse TID   Continuous Infusions:  Procedures/Studies: DG Swallowing Func-Speech Pathology Result Date: 04/02/2024 Table formatting from the original result was not included. Modified Barium Swallow Study Patient Details Name: Jay Campbell MRN: [id number] Date of Birth: Apr 06, 1945 Today's Date: 04/02/2024 HPI/PMH: HPI: Jay Campbell is a 79 y.o. male with medical history significant for Alzheimer's dementia, history of agitation due to dementia, coronary artery disease, permanent atrial fibrillation previously on Eliquis , complete heart block status post pacemaker placement, chronic thrombocytopenia, type 2 diabetes, hypertension, hyperlipidemia,  GERD, BPH, who presents to the ER via EMS from SNF due to shortness of breath and generalized weakness. The patient was discharged yesterday after being admitted for sepsis secondary to pneumonia and acute hypoxic respiratory failure requiring 2 L nasal cannula. Staff at SNF reported the patient was more short of breath and lethargic than baseline today. EMS was activated. He received albuterol  nebs en route via EMS. In the ER, hypoxic with O2 saturation of 87% on 2 L nasal cannula. The patient is alert and confused in the setting of dementia. Frail-appearing with pressure wounds in  heels. He is minimally verbal and unable to provide a history. A chest x-ray revealed increasing left basilar infiltrates and small effusion. Prominent cardiac shadow with stable pacemaker. Lactic acid 3.9. There was concern for sepsis. Subsequently, his CT chest abdomen pelvis without contrast revealed left lower lobe and lingular consolidation with patchy bilateral ground glass opacities more consistent with multifocal pneumonia. Compressive atelectasis versus additional pneumonia in the right lower lobe. Mild to moderate bilateral pleural effusions, right greater than left. Pt was seen by SLP service on Thursday and was discharged over the weekend. BSE completed and pt exhibiting overt s/s of aspiration and placed NPO. MBS ordered to determine safest diet and assess swallow function objectively. Clinical Impression: Clinical Impression: Pt presents with mild oropharyngeal dysphagia compounded by cognitive-based dysphagia c/b escape to lateral buccal cavity/floor of mouth and slow, prolonged mastication with solids.  Delayed initiation of tongue motion (oral holding) and delayed initiation of the swallow (typical for age) at the level of the valleculae throughout study.  Diminished pharyngeal stripping wave and partial epiglottic inflection observed potentially d/t bulging PPW/?cervical osteophytes.  Decreased tongue base retraction resulting in diffuse residue within the vallecular space, pyriform sinuses and posterior pharyngeal wall intermittently during study.  It should be noted pt exhibited xerostomia prior to any po intake d/t NPO status which may have contributed to amount of residue within pharynx and other structures.  As the study progressed and compensatory strategies instilled including liquid wash, alternating solids/liquids and multiple effortful swallows, residue cleared to mild-insignificant amount within affected structures.  Partial distension/obstruction of flow observed in UES d/t prominent CP  bar presence.  NO aspiration and/or penetration observed throughout study, but d/t pt's impaired cognition and deconditioning/dysphagia, he is at risk for aspiration if swallow strategies/A with meals/full supervision is not followed.  Recommend initiating a Dysphagia 1(puree)/thin liquid diet via tsp amounts with FULL supervision/A with meals.  ST will f/u for dysphagia tx/education during remainder of acute stay.  ST should f/u at next venue of care. Factors that may increase risk of adverse event in presence of aspiration Noe & Lianne 2021): Factors that may increase risk of adverse event in presence of aspiration Noe & Lianne 2021): Poor general health and/or compromised immunity; Reduced cognitive function; Frail or deconditioned; Weak cough; Aspiration of thick, dense, and/or acidic materials Recommendations/Plan: Swallowing Evaluation Recommendations Swallowing Evaluation Recommendations Recommendations: PO diet PO Diet Recommendation: Dysphagia 1 (Pureed); Thin liquids (Level 0) Liquid Administration via: Spoon Medication Administration: Crushed with puree Supervision: Full assist for feeding; Full supervision/cueing for swallowing strategies Swallowing strategies  : Slow rate; Small bites/sips; Minimize environmental distractions; effortful swallow; Multiple dry swallows after each bite/sip; Follow solids with liquids Postural changes: Position pt fully upright for meals; Stay upright 30-60 min after meals Oral care recommendations: Oral care BID (2x/day); Staff/trained caregiver to provide oral care Treatment Plan Treatment Plan Treatment recommendations: Therapy as outlined in treatment plan below  Follow-up recommendations: Skilled nursing-short term rehab (<3 hours/day) Functional status assessment: Patient has had a recent decline in their functional status and demonstrates the ability to make significant improvements in function in a reasonable and predictable amount of time. Treatment  frequency: Min 2x/week Treatment duration: 1 week Interventions: Aspiration precaution training; Compensatory techniques; Patient/family education; Trials of upgraded texture/liquids; Diet toleration management by SLP Recommendations Recommendations for follow up therapy are one component of a multi-disciplinary discharge planning process, led by the attending physician.  Recommendations may be updated based on patient status, additional functional criteria and insurance authorization. Assessment: Orofacial Exam: Orofacial Exam Oral Cavity: Oral Hygiene: Xerostomia Oral Cavity - Dentition: Poor condition; Missing dentition Orofacial Anatomy: WFL Oral Motor/Sensory Function: Generalized oral weakness Anatomy: Anatomy: WFL Boluses Administered: Boluses Administered Boluses Administered: Thin liquids (Level 0); Mildly thick liquids (Level 2, nectar thick); Moderately thick liquids (Level 3, honey thick); Puree; Solid  Oral Impairment Domain: Oral Impairment Domain Lip Closure: No labial escape Tongue control during bolus hold: Escape to lateral buccal cavity/floor of mouth Bolus preparation/mastication: Slow prolonged chewing/mashing with complete recollection Bolus transport/lingual motion: Delayed initiation of tongue motion (oral holding) Oral residue: Trace residue lining oral structures Location of oral residue : Tongue Initiation of pharyngeal swallow : Valleculae  Pharyngeal Impairment Domain: Pharyngeal Impairment Domain Soft palate elevation: No bolus between soft palate (SP)/pharyngeal wall (PW) Laryngeal elevation: Complete superior movement of thyroid  cartilage with complete approximation of arytenoids to epiglottic petiole Anterior hyoid excursion: Complete anterior movement Epiglottic movement: Partial inversion Laryngeal vestibule closure: Complete, no air/contrast in laryngeal vestibule Pharyngeal stripping wave : Present - diminished Pharyngeal contraction (A/P view only): N/A Pharyngoesophageal  segment opening: Partial distention/partial duration, partial obstruction of flow Tongue base retraction: Narrow column of contrast or air between tongue base and PPW Pharyngeal residue: Collection of residue within or on pharyngeal structures Location of pharyngeal residue: Diffuse (>3 areas)  Esophageal Impairment Domain: Esophageal Impairment Domain Esophageal clearance upright position: Complete clearance, esophageal coating Pill: Pill Consistency administered: -- (n/a) Penetration/Aspiration Scale Score: Penetration/Aspiration Scale Score 1.  Material does not enter airway: Thin liquids (Level 0); Mildly thick liquids (Level 2, nectar thick); Moderately thick liquids (Level 3, honey thick); Puree; Solid Compensatory Strategies: Compensatory Strategies Compensatory strategies: Yes Effortful swallow: Effective Effective Effortful Swallow: Thin liquid (Level 0); Mildly thick liquid (Level 2, nectar thick); Puree Multiple swallows: Effective Effective Multiple Swallows: Thin liquid (Level 0); Mildly thick liquid (Level 2, nectar thick); Puree Liquid wash: Effective Effective Liquid Wash: Thin liquid (Level 0); Mildly thick liquid (Level 2, nectar thick); Puree; Solid   General Information: Caregiver present: No  Diet Prior to this Study: NPO   Temperature : Normal   Respiratory Status: WFL   Supplemental O2: Nasal cannula (3L)   History of Recent Intubation: No  Behavior/Cognition: Alert; Cooperative; Distractible; Requires cueing Self-Feeding Abilities: Dependent for feeding Baseline vocal quality/speech: Hypophonia/low volume; Dysphonic Volitional Cough: Able to elicit Volitional Swallow: Unable to elicit Exam Limitations: No limitations Goal Planning: Prognosis for improved oropharyngeal function: Good Barriers to Reach Goals: Cognitive deficits No data recorded Patient/Family Stated Goal: n/a Consulted and agree with results and recommendations: Patient; Pt unable/family or caregiver not available; Nurse;  Physician Pain: Pain Assessment Pain Assessment: Faces Faces Pain Scale: 2 Breathing: 0 Negative Vocalization: 1 Facial Expression: 1 Body Language: 0 Consolability: 0 PAINAD Score: 0 Pain Location: generalized with movement Pain Descriptors / Indicators: Discomfort; Grimacing Pain Intervention(s): Limited activity within patient's tolerance; Repositioned End of Session: Start Time:SLP Start  Time (ACUTE ONLY): 1014 Stop Time: SLP Stop Time (ACUTE ONLY): 1050 Time Calculation:SLP Time Calculation (min) (ACUTE ONLY): 36 min Charges: SLP Evaluations $ SLP Speech Visit: 1 Visit SLP Evaluations $BSS Swallow: 1 Procedure $MBS Swallow: 1 Procedure SLP visit diagnosis: SLP Visit Diagnosis: Dysphagia, oropharyngeal phase (R13.12) Past Medical History: Past Medical History: Diagnosis Date  Alzheimer's dementia (HCC)   Anxiety   Atrial fibrillation (HCC)   Permanent  Coronary artery disease   Mild nonobstructive 1/12  DM (diabetes mellitus) (HCC)   GERD (gastroesophageal reflux disease)   Gout   Hearing disorder, cochlear   Hiatal hernia   HLD (hyperlipidemia)   HTN (hypertension)   S/P AV nodal ablation   s/p SJM PPM; gen change 02-01-13 by Dr Kelsie  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  Warfarin anticoagulation  Past Surgical History: Past Surgical History: Procedure Laterality Date  CATARACT EXTRACTION Right   COCHLEAR IMPLANT Right   HEMORRHOID BANDING    KNEE ARTHROSCOPY Right 1992  Dr Brenna   PACEMAKER GENERATOR CHANGE Bilateral 02/01/2013  Procedure: PACEMAKER GENERATOR CHANGE;  Surgeon: Lynwood JONETTA Kelsie, MD;  Location: [facility] CATH [facility];  Service: Cardiovascular;  Laterality: Bilateral;  PACEMAKER PLACEMENT  2002; 2014  SJM implanted by Dr Waddell with AV nodal ablation performed; gen change to [employer] SR RF by Dr Kelsie 02-01-13  RETINAL LASER PROCEDURE Left   SLT LASER APPLICATION Left 12/14/2015  Procedure: SLT LASER APPLICATION;  Surgeon: Dow JULIANNA Burke, MD;  Location: [facility];  Service: Ophthalmology;  Laterality:  Left; Pat Adams,M.S.,CCC-SLP 04/02/2024, 11:23 AM  ECHOCARDIOGRAM COMPLETE Result Date: 04/01/2024    ECHOCARDIOGRAM REPORT   Patient Name:   Jay Campbell Date of Exam: 04/01/2024 Medical Rec #:  [id number]        Height:       65.0 in Accession #:    [id number]       Weight:       175.7 lb Date of Birth:  11-05-1945        BSA:          1.872 m Patient Age:    78 years         BP:           158/69 mmHg Patient Gender: M                HR:           63 bpm. Exam Location:  [facility] Procedure: 2D Echo, Cardiac Doppler and Color Doppler (Both Spectral and Color            Flow Doppler were utilized during procedure). Indications:    CHF-Acute Diastolic I50.31  History:        Patient has prior history of Echocardiogram examinations, most                 recent 05/09/2019. CAD, Pacemaker, Arrythmias:Atrial Fibrillation                 and Complete heart block (HCC); Risk Factors:Hypertension,                 Diabetes, Dyslipidemia and Non-Smoker. Alzheimer's dementia                 (HCC) (From Hx).  Sonographer:    Aida Pizza RCS Referring Phys: [id number] TERRY LOISE HURST  Sonographer Comments: Image acquisition challenging due to patient behavioral factors. IMPRESSIONS  1. Left ventricular ejection fraction, by estimation, is 60 to 65%. The left ventricle  has normal function. The left ventricle has no regional wall motion abnormalities. There is mild left ventricular hypertrophy. Left ventricular diastolic parameters are indeterminate.  2. Right ventricular systolic function is normal. The right ventricular size is mildly enlarged. There is normal pulmonary artery systolic pressure.  3. Left atrial size was severely dilated.  4. Right atrial size was severely dilated.  5. The mitral valve is abnormal. Mild to moderate mitral valve regurgitation. No evidence of mitral stenosis. Severe mitral annular calcification.  6. The aortic valve is tricuspid. There is mild calcification of the aortic valve. There is mild  thickening of the aortic valve. Aortic valve regurgitation is not visualized. No aortic stenosis is present. Aortic valve mean gradient measures 6.0 mmHg.  7. Aortic dilatation noted. There is borderline dilatation of the aortic root, measuring 39 mm.  8. The inferior vena cava is normal in size with <50% respiratory variability, suggesting right atrial pressure of 8 mmHg. Comparison(s): No significant change from prior study. FINDINGS  Left Ventricle: Left ventricular ejection fraction, by estimation, is 60 to 65%. The left ventricle has normal function. The left ventricle has no regional wall motion abnormalities. Strain was performed and the global longitudinal strain is indeterminate. The left ventricular internal cavity size was normal in size. There is mild left ventricular hypertrophy. Left ventricular diastolic parameters are indeterminate. Right Ventricle: The right ventricular size is mildly enlarged. No increase in right ventricular wall thickness. Right ventricular systolic function is normal. There is normal pulmonary artery systolic pressure. The tricuspid regurgitant velocity is 2.34  m/s, and with an assumed right atrial pressure of 8 mmHg, the estimated right ventricular systolic pressure is 29.9 mmHg. Left Atrium: Left atrial size was severely dilated. Right Atrium: Right atrial size was severely dilated. Pericardium: There is no evidence of pericardial effusion. Mitral Valve: The mitral valve is abnormal. Severe mitral annular calcification. Mild to moderate mitral valve regurgitation. No evidence of mitral valve stenosis. Tricuspid Valve: The tricuspid valve is normal in structure. Tricuspid valve regurgitation is mild . No evidence of tricuspid stenosis. Aortic Valve: The aortic valve is tricuspid. There is mild calcification of the aortic valve. There is mild thickening of the aortic valve. Aortic valve regurgitation is not visualized. No aortic stenosis is present. Aortic valve mean gradient  measures 6.0 mmHg. Aortic valve peak gradient measures 11.3 mmHg. Aortic valve area, by VTI measures 1.90 cm. Pulmonic Valve: The pulmonic valve was normal in structure. Pulmonic valve regurgitation is trivial. No evidence of pulmonic stenosis. Aorta: Aortic dilatation noted. There is borderline dilatation of the aortic root, measuring 39 mm. Venous: The inferior vena cava is normal in size with less than 50% respiratory variability, suggesting right atrial pressure of 8 mmHg. IAS/Shunts: No atrial level shunt detected by color flow Doppler. Additional Comments: 3D was performed not requiring image post processing on an independent workstation and was indeterminate.  LEFT VENTRICLE PLAX 2D LVIDd:         4.80 cm LVIDs:         3.00 cm LV PW:         1.20 cm LV IVS:        1.20 cm LVOT diam:     2.10 cm LV SV:         56 LV SV Index:   30 LVOT Area:     3.46 cm  RIGHT VENTRICLE RV S prime:     14.30 cm/s TAPSE (M-mode): 2.7 cm LEFT ATRIUM  Index        RIGHT ATRIUM           Index LA diam:        5.90 cm  3.15 cm/m   RA Area:     38.35 cm LA Vol (A2C):   138.5 ml 73.98 ml/m  RA Volume:   150.00 ml 80.12 ml/m LA Vol (A4C):   182.0 ml 97.21 ml/m LA Biplane Vol: 165.0 ml 88.13 ml/m  AORTIC VALVE AV Area (Vmax):    1.64 cm AV Area (Vmean):   1.83 cm AV Area (VTI):     1.90 cm AV Vmax:           168.00 cm/s AV Vmean:          106.000 cm/s AV VTI:            0.294 m AV Peak Grad:      11.3 mmHg AV Mean Grad:      6.0 mmHg LVOT Vmax:         79.40 cm/s LVOT Vmean:        56.100 cm/s LVOT VTI:          0.161 m LVOT/AV VTI ratio: 0.55  AORTA Ao Root diam: 3.90 cm MITRAL VALVE                TRICUSPID VALVE MV Area (PHT): 3.27 cm     TR Peak grad:   21.9 mmHg MV Decel Time: 232 msec     TR Vmax:        234.00 cm/s MR Peak grad: 119.7 mmHg MR Mean grad: 75.0 mmHg     SHUNTS MR Vmax:      547.00 cm/s   Systemic VTI:  0.16 m MR Vmean:     401.0 cm/s    Systemic Diam: 2.10 cm MV E velocity: 107.00 cm/s MV A  velocity: 33.30 cm/s MV E/A ratio:  3.21 Vishnu Priya Mallipeddi Electronically signed by Diannah Late Mallipeddi Signature Date/Time: 04/01/2024/2:10:59 PM    Final    CT CHEST ABDOMEN PELVIS WO CONTRAST Result Date: 04/01/2024 EXAM: CT CHEST, ABDOMEN AND PELVIS WITHOUT CONTRAST 04/01/2024 01:25:10 AM TECHNIQUE: CT of the chest, abdomen and pelvis was performed without the administration of intravenous contrast. Multiplanar reformatted images are provided for review. Automated exposure control, iterative reconstruction, and/or weight based adjustment of the mA/kV was utilized to reduce the radiation dose to as low as reasonably achievable. COMPARISON: 02/07/2023. CLINICAL HISTORY: Sepsis. FINDINGS: CHEST: MEDIASTINUM AND LYMPH NODES: Cardiomegaly. Pacer wires in the right heart. Coronary artery aortic atherosclerosis. The central airways are clear. No mediastinal, hilar or axillary lymphadenopathy. LUNGS AND PLEURA: Mild to moderate bilateral pleural effusions, right greater than left. Consolidation with air bronchograms in the left lower lobe and posterior lingula concerning for pneumonia. Compressive atelectasis or pneumonia in the right lower lobe. Patchy ground glass airspace opacities also likely related to pneumonia. No pneumothorax. ABDOMEN AND PELVIS: LIVER: Mild nodular contours of the liver suggest cirrhosis. GALLBLADDER AND BILE DUCTS: High-density material within the gallbladder could reflect small stones or contrast material. No biliary ductal dilatation. SPLEEN: The spleen is mildly enlarged at 13 cm craniocaudal length. PANCREAS: No acute abnormality. ADRENAL GLANDS: No acute abnormality. KIDNEYS, URETERS AND BLADDER: 1.6 cm stone in the upper pole of the right kidney. No hydronephrosis. No perinephric or periureteral stranding. Urinary bladder is unremarkable. GI AND BOWEL: The stomach is decompressed but appears to be thick-walled. This could be related to portal gastropathy or  gastritis. Sigmoid  diverticulosis. No active diverticulitis. Moderate stool burden throughout the colon. There is no bowel obstruction. REPRODUCTIVE ORGANS: Prostate enlargement. PERITONEUM AND RETROPERITONEUM: Moderate free fluid in the abdomen and pelvis. No free air. VASCULATURE: Aorta is normal in caliber. Aortoiliac catheter sclerosis. ABDOMINAL AND PELVIS LYMPH NODES: No lymphadenopathy. BONES AND SOFT TISSUES: Stable severe compression fracture at L1. Diffuse osteopenia. No focal soft tissue abnormality. IMPRESSION: 1. Left lower lobe and lingular consolidation with patchy bilateral ground-glass opacities, most consistent with multifocal pneumonia. 2. Compressive atelectasis versus additional pneumonia in the right lower lobe. 3. Mild to moderate bilateral pleural effusions, right greater than left. 4. Cirrhotic liver morphology with moderate abdominopelvic ascites and splenomegaly . 5. Gastric wall thickening may be related to portal gastropathy or gastritis . 6. Right upper pole nephrolithiasis . 7. Coronary artery disease, aortic atherosclerosis. 8. Sigmoid diverticulosis. Moderate stool burden in the colon. Electronically signed by: Franky Crease MD 04/01/2024 01:36 AM EST RP Workstation: [id number]   DG Chest Port 1 View Result Date: 04/01/2024 EXAM: 1 VIEW(S) XRAY OF THE CHEST 04/01/2024 01:00:00 AM COMPARISON: Comparison from 03/24/2024. CLINICAL HISTORY: Increased lethargy and shortness of breath. FINDINGS: LUNGS AND PLEURA: Increasing left basilar infiltrate. Small left pleural effusion. No pneumothorax. HEART AND MEDIASTINUM: The cardiac shadow is prominent. The pacemaker is again seen and stable. BONES AND SOFT TISSUES: No bony abnormality is noted. IMPRESSION: 1. Increasing left basilar infiltrate and small effusion. 2. Prominent cardiac shadow with stable pacemaker. Electronically signed by: Oneil Devonshire MD 04/01/2024 01:04 AM EST RP Workstation: [id number]   DG Chest Port 1 View Result Date: 03/24/2024 CLINICAL  DATA:  Respiratory distress. EXAM: PORTABLE CHEST 1 VIEW COMPARISON:  03/03/2024 FINDINGS: The cardio pericardial silhouette is enlarged. Retrocardiac atelectasis or infiltrate noted. Right lung clear. Single lead left-sided permanent pacemaker again noted. Telemetry leads overlie the chest. IMPRESSION: Retrocardiac atelectasis or infiltrate. Electronically Signed   By: Camellia Candle M.D.   On: 03/24/2024 07:22    Eric Nunnery, MD [facility]  If 7PM-7AM, please contact night-coverage [email or web address] Password [email or web address] 04/10/2024, 5:35 PM   LOS: 7 days   "

## 2024-04-10 NOTE — Progress Notes (Signed)
" °                                                                                                     °                                                   °  Daily Progress Note   Patient Name: Jay Campbell       Date: 04/10/2024 DOB: Sep 09, 1945  Age: 79 y.o. MRN#: [id number] Attending Physician: Ricky Fines, MD Primary Care Physician: [name], MD Admit Date: 04/03/2024  Reason for Initial Consultation/Follow-up: {Reason for Consult:23484}  Subjective: ***  Today, ***  Chart review/care coordination:  Completed extensive chart review including EPIC notes, ***. Coordinated care with ****.    Length of Stay: 7   Physical Exam          Vital Signs: BP 127/62 (BP Location: Left Arm)   Pulse 66   Temp (!) 97.4 F (36.3 C) (Axillary)   Resp 18   SpO2 94%  SpO2: SpO2: 94 % O2 Device: O2 Device: Room Air O2 Flow Rate: O2 Flow Rate (L/min): 2 L/min      Palliative Assessment/Data:   Palliative Care Assessment & Plan   Patient Profile/Assessment:  ***   Recommendations/Plan: ***   Symptom management:  ***  Prognosis:  {Palliative Care Prognosis:23504}    Discharge Planning: {Palliative dispostion:23505}    Detailed review of medical records (labs, imaging, vital signs), medically appropriate exam, discussed with treatment team, counseling and education to patient, family, & staff, documenting clinical information, medication management, coordination of care   Total time:  I personally spent a total of *** minutes in the care of the patient today including {Time Based Coding:210964241}.    Billing based on MDM: ***  {Problems Addressed:304933}  {Amount and/or Complexity of Ijuj:695065}  {Risks:304936}         Laymon CHRISTELLA Pinal, NP  Palliative Medicine Team Team phone # [phone]  Thank you for allowing the Palliative Medicine Team to assist in the care of this patient. Please utilize secure chat with additional questions, if there is  no response within 30 minutes please call the above phone number.  Palliative Medicine Team providers are available by phone from 7am to 7pm daily and can be reached through the team cell phone.  Should this patient require assistance outside of these hours, please call the patient's attending physician.   "

## 2024-04-10 NOTE — TOC Progression Note (Signed)
 Transition of Care [facility]) - Progression Note    Patient Details  Name: Jay Campbell MRN: [id number] Date of Birth: [birth year]  Transition of Care [facility]) CM/SW Contact  Hoy DELENA Bigness, LCSW Phone Number: 04/10/2024, 1:06 PM  Clinical Narrative:    CSW awaiting to hear back from [facility] regarding bed availability at [facility].                      Expected Discharge Plan and Services                                               Social Drivers of Health (SDOH) Interventions SDOH Screenings   Food Insecurity: Patient Unable To Answer (04/03/2024)  Housing: Unknown (04/03/2024)  Transportation Needs: Patient Unable To Answer (04/03/2024)  Utilities: Patient Unable To Answer (04/03/2024)  Financial Resource Strain: Low Risk (07/01/2023)   Received from [facility]  Social Connections: Patient Unable To Answer (04/03/2024)  Tobacco Use: Low Risk (04/04/2024)    Readmission Risk Interventions    04/01/2024   11:17 AM 03/31/2024    3:53 PM 03/25/2024    9:58 AM  Readmission Risk Prevention Plan  Transportation Screening Complete Complete Complete  Medication Review [employer]) Complete Complete Complete  PCP or Specialist appointment within 3-5 days of discharge Not Complete    HRI or Home Care Consult Complete  Complete  SW Recovery Care/Counseling Consult Complete Complete Complete  Palliative Care Screening Not Complete  Not Applicable  Skilled Nursing Facility Complete Complete Complete

## 2024-04-10 NOTE — Progress Notes (Addendum)
 Nurse at bedside,patient ate at least 80 percent of his lunch,and two cups of apple juice.Plan of care on going.

## 2024-04-10 NOTE — Progress Notes (Addendum)
 Nurse at bedside,patient non verbal this morning,but does moan out ,eyes are open but not communicating with me verbally patient is total care.Reached out to Dr Ricky  for a Dysphasia diet for patient.Plan of care on going.

## 2024-04-11 ENCOUNTER — Other Ambulatory Visit: Payer: Self-pay

## 2024-04-11 DIAGNOSIS — Z515 Encounter for palliative care: Secondary | ICD-10-CM | POA: Diagnosis not present

## 2024-04-11 NOTE — Progress Notes (Signed)
 "          PROGRESS NOTE  Jay Campbell FMW:[id number] DOB: 05-May-1945 DOA: 04/03/2024 PCP: [name], MD  Brief History:  79 y.o. male with medical history significant for Alzheimer's dementia, history of agitation due to dementia, coronary artery disease, permanent atrial fibrillation previously on Eliquis , complete heart block status post pacemaker placement, chronic thrombocytopenia, type 2 diabetes, hypertension, hyperlipidemia, GERD, BPH, who presents to the ER via EMS from SNF due to shortness of breath and generalized weakness.  The patient was discharged yesterday after being admitted for sepsis secondary to pneumonia and acute hypoxic respiratory failure requiring 2 L nasal cannula.  Staff at SNF reported the patient was more short of breath and lethargic than baseline today.  EMS was activated.  He received albuterol  nebs en route via EMS.   In the ER, hypoxic with O2 saturation of 87% on 2 L nasal cannula.  The patient is alert and confused in the setting of dementia.  Frail-appearing with pressure wounds in heels.  He is minimally verbal and unable to provide a history.     A chest x-ray revealed increasing left basilar infiltrates and small effusion.  Prominent cardiac shadow with stable pacemaker.  Lactic acid 3.9.  There was concern for sepsis.  Subsequently, his CT chest abdomen pelvis without contrast revealed left lower lobe and lingular consolidation with patchy bilateral ground glass opacities more consistent with multifocal pneumonia.  Compressive atelectasis versus additional pneumonia in the right lower lobe.  Mild to moderate bilateral pleural effusions, right greater than left.  Cirrhotic liver morphology with moderate abdominal pelvic ascites and splenomegaly.  Gastric wall thickening may be related to portal gastropathy or gastritis.  Right upper lobe nephrolithiasis.  Coronary artery disease, aortic atherosclerosis.  Sigmoid diverticulosis.  Moderate stool burden in the  colon.   The patient received 1 dose of cefepime  and azithromycin  in the ER.  TR[facility], hospitalist service, was asked to admit for worsening pneumonia.  Subsequently, the patient's antibiotics were discontinued as the patient received a full course of antibiotics during his last hospitalization from 03/24/2024 to 03/31/2024.  He remained afebrile and hemodynamically stable. Unfortunately, the patient's mental status did not improve much.  He was noted to be dehydrated with a free water  deficit.  Sodium went up to 155.  He was started on IV hypotonic fluid.  Although his sodium did gradually improve, the patient remained somnolent with minimal to no p.o. intake.  He continued to have dysphagia.  The patient was seen by speech therapy.  MBS was performed and did not reveal aspiration; however, patient was felt to be at continued risk for aspiration secondary to his impaired cognition and deconditioning.  A dysphagia 1 diet was ultimately recommended.  Palliative medicine was consulted.  After meetings with the patient's family, the patient's son was ultimately agreeable to transition to full comfort measures after the patient was accepted to residential hospice.   A bed was not available at [facility].  The patient was admitted to the hospital for hospital hospice care until bed is available at [facility] house.  Palliative medicine continue to follow.  They spoke with patient's son and verified desire to transition to full comfort measures.   Patient is stable and at the moment waiting for bed availability at the [place] house to continue symptomatic management and hospice care.  Assessment/Plan: Adult Failure to Thrive -- unfortunately, despite maximal medical interventions and recent prolonged hospitalization of 7 days he has bounced back into  the hospital within 24 hours. -- Patient has significant dysphagia and unable to eat/drink enough to maintain body requirements for fluids and nutrition -- Patient was  initially started on IV antibiotics and IV fluids. --He did not improve clinically.  He continued to be somnolent with very poor to no oral intake -- continue DNR/DNI orders present on admission -Ultimately, patient's family agreed to transition to full comfort measures after the patient was accepted to residential hospice. - Patient now has been transitioned to general inpatient hospice and for comfort measures have been instituted. - added oral morphine  solution   Multifocal pneumonia, seen on CT scan -- Pneumonia was fully treated during previous hospitalization -- DC antibiotics -- Patient remained afebrile hemodynamically stable - Patient now has been transitioned to general inpatient hospice and for comfort measures have been instituted.   Severe Dysphagia -- SLP evaluation requested -- MBS done--no definitive aspiration -- Patient continued to remain high risk for aspiration -Dysphagia 1 diet ordered>> pleasure feeding -Oral intake remains very poor - Patient now has been transitioned to general inpatient hospice and for comfort measures have been instituted.   Bilateral mild to moderate pleural effusions, seen on CT scan -- Closely monitor volume status -- Secondary to hepatic hydrothorax -Cirrhotic liver noted on CT abdomen and pelvis   Chronic hypoxic respiratory failure secondary to the above -- Recently started on 2 L O2 supplementation during previous admission for pneumonia. -- Unfortunately supplemental oxygen  wasn't provided at facility -Stable on 2 to 3 L presently. - Patient now has been transitioned to general inpatient hospice and for comfort measures have been instituted.   Lactic acidosis -- Secondary to Hypoxia, hypoperfusion, volume depletion -- Lactic acid is trending down  -- Follow peripheral blood cultures x 2. -- IV fluid hydration  -- Sepsis has been ruled out   Elevated troponin, suspect demand ischemia secondary to the above History of coronary  artery disease Aortic atherosclerosis High-sensitivity troponin 23, 26. No evidence of acute ischemia on twelve-lead EKG No chest pain reported. Monitor on telemetry.   Chronic HFpEF Last 2D echo done on 05/09/2019 revealed LVEF 60 to 65% Presented with dyspnea, cardiomegaly, and bilateral pleural effusions -04/01/2024 echo EF 60 to 65%, no WMA, normal RVF - Patient now has been transitioned to general inpatient hospice and for comfort measures have been instituted.   Hypernatremia Speech therapy is consulted to rule out dysphagia Hypotonic IV fluid to correct free water  deficit --Patient now transition to full comfort measures   Hypokalemia -initially repleted -now on comfort measures   Gastric wall thickening, may be related to portal gastropathy or gastritis, seen on CT scan. IV PPI Protonix  40 mg daily initially No reported abdominal pain or GI bleed Avoid NSAIDs Patient may transition to full comfort measures   Generalized weakness He is chronically debilitated and this is unchanged Palliative consultation requested    Type 2 diabetes with hyperglycemia Last hemoglobin A1c 7.3 on 03/24/2024   Hypertension Resume home Lopressor  Closely monitor vital signs.   Alzheimer's dementia with history of agitation Resume home regimen. Aricept  held due to transient bradycardia. -now on comfort measures care   Permanent atrial fibrillation, no longer on Eliquis  due to chronic thrombocytopenia Currently rate controlled --- Patient now has been transitioned to general inpatient hospice and for comfort measures have been instituted.     Family Communication:   no Family at bedside   Consultants:  palliative   Code Status:  FULL COMFORT   DVT Prophylaxis:  FULL  COMFORT     Procedures: As Listed in Progress Note Above   Antibiotics: none  Subjective: Minimal oral intake; dark brown urine visible in collection container.  In no major distress.  Objective: Vitals:    04/09/24 2014 04/10/24 0842 04/10/24 1357 04/11/24 0528  BP: (!) 149/80 (!) 149/76 127/62 90/64  Pulse: (!) 57 63 66 86  Resp: 18 19 18 18   Temp: 97.7 F (36.5 C) 98.3 F (36.8 C) (!) 97.4 F (36.3 C) 97.6 F (36.4 C)  TempSrc: Axillary Axillary Axillary Oral  SpO2: 96% 100% 94% 91%    Intake/Output Summary (Last 24 hours) at 04/11/2024 1432 Last data filed at 04/11/2024 0600 Gross per 24 hour  Intake --  Output 600 ml  Net -600 ml    Weight change:   Exam: General exam: Chronically ill in appearance; dry mucous membranes.  In no acute distress. Respiratory system: Good saturation; no wheezing or crackles. Cardiovascular system:RRR.  No rubs or gallops. Gastrointestinal system: Abdomen is nondistended, soft and nontender. No organomegaly or masses felt. Normal bowel sounds heard. Central nervous system: Limited examination; patient not following much of commands on today's exam. Extremities: No cyanosis or clubbing. Skin: No petechiae. Psychiatry: Flat affect appreciated on exam.  Data Reviewed: I have personally reviewed following labs and imaging studies  Basic Metabolic Panel: No results for input(s): NA, K, CL, CO2, GLUCOSE, BUN, CREATININE, CALCIUM, MG, PHOS in the last 168 hours.  HbA1C: No results for input(s): HGBA1C in the last 72 hours.  Urine analysis:    Component Value Date/Time   COLORURINE COLORLESS (A) 04/01/2024 0658   APPEARANCEUR CLEAR 04/01/2024 0658   LABSPEC 1.008 04/01/2024 0658   PHURINE 5.0 04/01/2024 0658   GLUCOSEU NEGATIVE 04/01/2024 0658   HGBUR MODERATE (A) 04/01/2024 0658   BILIRUBINUR NEGATIVE 04/01/2024 0658   KETONESUR NEGATIVE 04/01/2024 0658   PROTEINUR NEGATIVE 04/01/2024 0658   NITRITE NEGATIVE 04/01/2024 0658   LEUKOCYTESUR NEGATIVE 04/01/2024 0658   Sepsis Labs:  No results found for this or any previous visit (from the past 240 hours).    Scheduled Meds:  antiseptic oral rinse  15 mL Mouth  Rinse TID   Continuous Infusions:  Procedures/Studies: DG Swallowing Func-Speech Pathology Result Date: 04/02/2024 Table formatting from the original result was not included. Modified Barium Swallow Study Patient Details Name: Jay Campbell MRN: [id number] Date of Birth: April 24, 1945 Today's Date: 04/02/2024 HPI/PMH: HPI: PERCELL LAMBOY is a 79 y.o. male with medical history significant for Alzheimer's dementia, history of agitation due to dementia, coronary artery disease, permanent atrial fibrillation previously on Eliquis , complete heart block status post pacemaker placement, chronic thrombocytopenia, type 2 diabetes, hypertension, hyperlipidemia, GERD, BPH, who presents to the ER via EMS from SNF due to shortness of breath and generalized weakness. The patient was discharged yesterday after being admitted for sepsis secondary to pneumonia and acute hypoxic respiratory failure requiring 2 L nasal cannula. Staff at SNF reported the patient was more short of breath and lethargic than baseline today. EMS was activated. He received albuterol  nebs en route via EMS. In the ER, hypoxic with O2 saturation of 87% on 2 L nasal cannula. The patient is alert and confused in the setting of dementia. Frail-appearing with pressure wounds in heels. He is minimally verbal and unable to provide a history. A chest x-ray revealed increasing left basilar infiltrates and small effusion. Prominent cardiac shadow with stable pacemaker. Lactic acid 3.9. There was concern for sepsis. Subsequently, his CT chest abdomen pelvis  without contrast revealed left lower lobe and lingular consolidation with patchy bilateral ground glass opacities more consistent with multifocal pneumonia. Compressive atelectasis versus additional pneumonia in the right lower lobe. Mild to moderate bilateral pleural effusions, right greater than left. Pt was seen by SLP service on Thursday and was discharged over the weekend. BSE completed and pt exhibiting  overt s/s of aspiration and placed NPO. MBS ordered to determine safest diet and assess swallow function objectively. Clinical Impression: Clinical Impression: Pt presents with mild oropharyngeal dysphagia compounded by cognitive-based dysphagia c/b escape to lateral buccal cavity/floor of mouth and slow, prolonged mastication with solids.  Delayed initiation of tongue motion (oral holding) and delayed initiation of the swallow (typical for age) at the level of the valleculae throughout study.  Diminished pharyngeal stripping wave and partial epiglottic inflection observed potentially d/t bulging PPW/?cervical osteophytes.  Decreased tongue base retraction resulting in diffuse residue within the vallecular space, pyriform sinuses and posterior pharyngeal wall intermittently during study.  It should be noted pt exhibited xerostomia prior to any po intake d/t NPO status which may have contributed to amount of residue within pharynx and other structures.  As the study progressed and compensatory strategies instilled including liquid wash, alternating solids/liquids and multiple effortful swallows, residue cleared to mild-insignificant amount within affected structures.  Partial distension/obstruction of flow observed in UES d/t prominent CP bar presence.  NO aspiration and/or penetration observed throughout study, but d/t pt's impaired cognition and deconditioning/dysphagia, he is at risk for aspiration if swallow strategies/A with meals/full supervision is not followed.  Recommend initiating a Dysphagia 1(puree)/thin liquid diet via tsp amounts with FULL supervision/A with meals.  ST will f/u for dysphagia tx/education during remainder of acute stay.  ST should f/u at next venue of care. Factors that may increase risk of adverse event in presence of aspiration Noe & Lianne 2021): Factors that may increase risk of adverse event in presence of aspiration Noe & Lianne 2021): Poor general health and/or  compromised immunity; Reduced cognitive function; Frail or deconditioned; Weak cough; Aspiration of thick, dense, and/or acidic materials Recommendations/Plan: Swallowing Evaluation Recommendations Swallowing Evaluation Recommendations Recommendations: PO diet PO Diet Recommendation: Dysphagia 1 (Pureed); Thin liquids (Level 0) Liquid Administration via: Spoon Medication Administration: Crushed with puree Supervision: Full assist for feeding; Full supervision/cueing for swallowing strategies Swallowing strategies  : Slow rate; Small bites/sips; Minimize environmental distractions; effortful swallow; Multiple dry swallows after each bite/sip; Follow solids with liquids Postural changes: Position pt fully upright for meals; Stay upright 30-60 min after meals Oral care recommendations: Oral care BID (2x/day); Staff/trained caregiver to provide oral care Treatment Plan Treatment Plan Treatment recommendations: Therapy as outlined in treatment plan below Follow-up recommendations: Skilled nursing-short term rehab (<3 hours/day) Functional status assessment: Patient has had a recent decline in their functional status and demonstrates the ability to make significant improvements in function in a reasonable and predictable amount of time. Treatment frequency: Min 2x/week Treatment duration: 1 week Interventions: Aspiration precaution training; Compensatory techniques; Patient/family education; Trials of upgraded texture/liquids; Diet toleration management by SLP Recommendations Recommendations for follow up therapy are one component of a multi-disciplinary discharge planning process, led by the attending physician.  Recommendations may be updated based on patient status, additional functional criteria and insurance authorization. Assessment: Orofacial Exam: Orofacial Exam Oral Cavity: Oral Hygiene: Xerostomia Oral Cavity - Dentition: Poor condition; Missing dentition Orofacial Anatomy: WFL Oral Motor/Sensory Function:  Generalized oral weakness Anatomy: Anatomy: WFL Boluses Administered: Boluses Administered Boluses Administered: Thin liquids (Level 0); Mildly  thick liquids (Level 2, nectar thick); Moderately thick liquids (Level 3, honey thick); Puree; Solid  Oral Impairment Domain: Oral Impairment Domain Lip Closure: No labial escape Tongue control during bolus hold: Escape to lateral buccal cavity/floor of mouth Bolus preparation/mastication: Slow prolonged chewing/mashing with complete recollection Bolus transport/lingual motion: Delayed initiation of tongue motion (oral holding) Oral residue: Trace residue lining oral structures Location of oral residue : Tongue Initiation of pharyngeal swallow : Valleculae  Pharyngeal Impairment Domain: Pharyngeal Impairment Domain Soft palate elevation: No bolus between soft palate (SP)/pharyngeal wall (PW) Laryngeal elevation: Complete superior movement of thyroid  cartilage with complete approximation of arytenoids to epiglottic petiole Anterior hyoid excursion: Complete anterior movement Epiglottic movement: Partial inversion Laryngeal vestibule closure: Complete, no air/contrast in laryngeal vestibule Pharyngeal stripping wave : Present - diminished Pharyngeal contraction (A/P view only): N/A Pharyngoesophageal segment opening: Partial distention/partial duration, partial obstruction of flow Tongue base retraction: Narrow column of contrast or air between tongue base and PPW Pharyngeal residue: Collection of residue within or on pharyngeal structures Location of pharyngeal residue: Diffuse (>3 areas)  Esophageal Impairment Domain: Esophageal Impairment Domain Esophageal clearance upright position: Complete clearance, esophageal coating Pill: Pill Consistency administered: -- (n/a) Penetration/Aspiration Scale Score: Penetration/Aspiration Scale Score 1.  Material does not enter airway: Thin liquids (Level 0); Mildly thick liquids (Level 2, nectar thick); Moderately thick liquids (Level  3, honey thick); Puree; Solid Compensatory Strategies: Compensatory Strategies Compensatory strategies: Yes Effortful swallow: Effective Effective Effortful Swallow: Thin liquid (Level 0); Mildly thick liquid (Level 2, nectar thick); Puree Multiple swallows: Effective Effective Multiple Swallows: Thin liquid (Level 0); Mildly thick liquid (Level 2, nectar thick); Puree Liquid wash: Effective Effective Liquid Wash: Thin liquid (Level 0); Mildly thick liquid (Level 2, nectar thick); Puree; Solid   General Information: Caregiver present: No  Diet Prior to this Study: NPO   Temperature : Normal   Respiratory Status: WFL   Supplemental O2: Nasal cannula (3L)   History of Recent Intubation: No  Behavior/Cognition: Alert; Cooperative; Distractible; Requires cueing Self-Feeding Abilities: Dependent for feeding Baseline vocal quality/speech: Hypophonia/low volume; Dysphonic Volitional Cough: Able to elicit Volitional Swallow: Unable to elicit Exam Limitations: No limitations Goal Planning: Prognosis for improved oropharyngeal function: Good Barriers to Reach Goals: Cognitive deficits No data recorded Patient/Family Stated Goal: n/a Consulted and agree with results and recommendations: Patient; Pt unable/family or caregiver not available; Nurse; Physician Pain: Pain Assessment Pain Assessment: Faces Faces Pain Scale: 2 Breathing: 0 Negative Vocalization: 1 Facial Expression: 1 Body Language: 0 Consolability: 0 PAINAD Score: 0 Pain Location: generalized with movement Pain Descriptors / Indicators: Discomfort; Grimacing Pain Intervention(s): Limited activity within patient's tolerance; Repositioned End of Session: Start Time:SLP Start Time (ACUTE ONLY): 1014 Stop Time: SLP Stop Time (ACUTE ONLY): 1050 Time Calculation:SLP Time Calculation (min) (ACUTE ONLY): 36 min Charges: SLP Evaluations $ SLP Speech Visit: 1 Visit SLP Evaluations $BSS Swallow: 1 Procedure $MBS Swallow: 1 Procedure SLP visit diagnosis: SLP Visit Diagnosis:  Dysphagia, oropharyngeal phase (R13.12) Past Medical History: Past Medical History: Diagnosis Date  Alzheimer's dementia (HCC)   Anxiety   Atrial fibrillation (HCC)   Permanent  Coronary artery disease   Mild nonobstructive 1/12  DM (diabetes mellitus) (HCC)   GERD (gastroesophageal reflux disease)   Gout   Hearing disorder, cochlear   Hiatal hernia   HLD (hyperlipidemia)   HTN (hypertension)   S/P AV nodal ablation   s/p SJM PPM; gen change 02-01-13 by Dr Kelsie  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  Warfarin anticoagulation  Past Surgical  History: Past Surgical History: Procedure Laterality Date  CATARACT EXTRACTION Right   COCHLEAR IMPLANT Right   HEMORRHOID BANDING    KNEE ARTHROSCOPY Right 1992  Dr Brenna   PACEMAKER GENERATOR CHANGE Bilateral 02/01/2013  Procedure: PACEMAKER GENERATOR CHANGE;  Surgeon: Lynwood JONETTA Rakers, MD;  Location[facility] CATH LAB;  Service: Cardiovascular;  Laterality: Bilateral;  PACEMAKER PLACEMENT  2002; 2014  SJM implanted by Dr Waddell with AV nodal ablation performed; gen change to [employer] SR RF by Dr Rakers 02-01-13  RETINAL LASER PROCEDURE Left   SLT LASER APPLICATION Left 12/14/2015  Procedure: SLT LASER APPLICATION;  Surgeon: Dow JULIANNA Burke, MD;  Location: [facility];  Service: Ophthalmology;  Laterality: Left; Pat Adams,M.S.,CCC-SLP 04/02/2024, 11:23 AM  ECHOCARDIOGRAM COMPLETE Result Date: 04/01/2024    ECHOCARDIOGRAM REPORT   Patient Name:   NEEL BUFFONE Date of Exam: 04/01/2024 Medical Rec #:  [id number]        Height:       65.0 in Accession #:    [id number]       Weight:       175.7 lb Date of Birth:  Oct 12, 1945        BSA:          1.872 m Patient Age:    78 years         BP:           158/69 mmHg Patient Gender: M                HR:           63 bpm. Exam Location:  [facility] Procedure: 2D Echo, Cardiac Doppler and Color Doppler (Both Spectral and Color            Flow Doppler were utilized during procedure). Indications:    CHF-Acute Diastolic I50.31  History:         Patient has prior history of Echocardiogram examinations, most                 recent 05/09/2019. CAD, Pacemaker, Arrythmias:Atrial Fibrillation                 and Complete heart block (HCC); Risk Factors:Hypertension,                 Diabetes, Dyslipidemia and Non-Smoker. Alzheimer's dementia                 (HCC) (From Hx).  Sonographer:    Aida Pizza RCS Referring Phys: [id number] TERRY LOISE HURST  Sonographer Comments: Image acquisition challenging due to patient behavioral factors. IMPRESSIONS  1. Left ventricular ejection fraction, by estimation, is 60 to 65%. The left ventricle has normal function. The left ventricle has no regional wall motion abnormalities. There is mild left ventricular hypertrophy. Left ventricular diastolic parameters are indeterminate.  2. Right ventricular systolic function is normal. The right ventricular size is mildly enlarged. There is normal pulmonary artery systolic pressure.  3. Left atrial size was severely dilated.  4. Right atrial size was severely dilated.  5. The mitral valve is abnormal. Mild to moderate mitral valve regurgitation. No evidence of mitral stenosis. Severe mitral annular calcification.  6. The aortic valve is tricuspid. There is mild calcification of the aortic valve. There is mild thickening of the aortic valve. Aortic valve regurgitation is not visualized. No aortic stenosis is present. Aortic valve mean gradient measures 6.0 mmHg.  7. Aortic dilatation noted. There is borderline dilatation of the aortic root, measuring 39 mm.  8. The inferior vena cava is normal in size with <50% respiratory variability, suggesting right atrial pressure of 8 mmHg. Comparison(s): No significant change from prior study. FINDINGS  Left Ventricle: Left ventricular ejection fraction, by estimation, is 60 to 65%. The left ventricle has normal function. The left ventricle has no regional wall motion abnormalities. Strain was performed and the global longitudinal strain is  indeterminate. The left ventricular internal cavity size was normal in size. There is mild left ventricular hypertrophy. Left ventricular diastolic parameters are indeterminate. Right Ventricle: The right ventricular size is mildly enlarged. No increase in right ventricular wall thickness. Right ventricular systolic function is normal. There is normal pulmonary artery systolic pressure. The tricuspid regurgitant velocity is 2.34  m/s, and with an assumed right atrial pressure of 8 mmHg, the estimated right ventricular systolic pressure is 29.9 mmHg. Left Atrium: Left atrial size was severely dilated. Right Atrium: Right atrial size was severely dilated. Pericardium: There is no evidence of pericardial effusion. Mitral Valve: The mitral valve is abnormal. Severe mitral annular calcification. Mild to moderate mitral valve regurgitation. No evidence of mitral valve stenosis. Tricuspid Valve: The tricuspid valve is normal in structure. Tricuspid valve regurgitation is mild . No evidence of tricuspid stenosis. Aortic Valve: The aortic valve is tricuspid. There is mild calcification of the aortic valve. There is mild thickening of the aortic valve. Aortic valve regurgitation is not visualized. No aortic stenosis is present. Aortic valve mean gradient measures 6.0 mmHg. Aortic valve peak gradient measures 11.3 mmHg. Aortic valve area, by VTI measures 1.90 cm. Pulmonic Valve: The pulmonic valve was normal in structure. Pulmonic valve regurgitation is trivial. No evidence of pulmonic stenosis. Aorta: Aortic dilatation noted. There is borderline dilatation of the aortic root, measuring 39 mm. Venous: The inferior vena cava is normal in size with less than 50% respiratory variability, suggesting right atrial pressure of 8 mmHg. IAS/Shunts: No atrial level shunt detected by color flow Doppler. Additional Comments: 3D was performed not requiring image post processing on an independent workstation and was indeterminate.  LEFT  VENTRICLE PLAX 2D LVIDd:         4.80 cm LVIDs:         3.00 cm LV PW:         1.20 cm LV IVS:        1.20 cm LVOT diam:     2.10 cm LV SV:         56 LV SV Index:   30 LVOT Area:     3.46 cm  RIGHT VENTRICLE RV S prime:     14.30 cm/s TAPSE (M-mode): 2.7 cm LEFT ATRIUM              Index        RIGHT ATRIUM           Index LA diam:        5.90 cm  3.15 cm/m   RA Area:     38.35 cm LA Vol (A2C):   138.5 ml 73.98 ml/m  RA Volume:   150.00 ml 80.12 ml/m LA Vol (A4C):   182.0 ml 97.21 ml/m LA Biplane Vol: 165.0 ml 88.13 ml/m  AORTIC VALVE AV Area (Vmax):    1.64 cm AV Area (Vmean):   1.83 cm AV Area (VTI):     1.90 cm AV Vmax:           168.00 cm/s AV Vmean:          106.000 cm/s AV  VTI:            0.294 m AV Peak Grad:      11.3 mmHg AV Mean Grad:      6.0 mmHg LVOT Vmax:         79.40 cm/s LVOT Vmean:        56.100 cm/s LVOT VTI:          0.161 m LVOT/AV VTI ratio: 0.55  AORTA Ao Root diam: 3.90 cm MITRAL VALVE                TRICUSPID VALVE MV Area (PHT): 3.27 cm     TR Peak grad:   21.9 mmHg MV Decel Time: 232 msec     TR Vmax:        234.00 cm/s MR Peak grad: 119.7 mmHg MR Mean grad: 75.0 mmHg     SHUNTS MR Vmax:      547.00 cm/s   Systemic VTI:  0.16 m MR Vmean:     401.0 cm/s    Systemic Diam: 2.10 cm MV E velocity: 107.00 cm/s MV A velocity: 33.30 cm/s MV E/A ratio:  3.21 Vishnu Priya Mallipeddi Electronically signed by Diannah Late Mallipeddi Signature Date/Time: 04/01/2024/2:10:59 PM    Final    CT CHEST ABDOMEN PELVIS WO CONTRAST Result Date: 04/01/2024 EXAM: CT CHEST, ABDOMEN AND PELVIS WITHOUT CONTRAST 04/01/2024 01:25:10 AM TECHNIQUE: CT of the chest, abdomen and pelvis was performed without the administration of intravenous contrast. Multiplanar reformatted images are provided for review. Automated exposure control, iterative reconstruction, and/or weight based adjustment of the mA/kV was utilized to reduce the radiation dose to as low as reasonably achievable. COMPARISON: 02/07/2023.  CLINICAL HISTORY: Sepsis. FINDINGS: CHEST: MEDIASTINUM AND LYMPH NODES: Cardiomegaly. Pacer wires in the right heart. Coronary artery aortic atherosclerosis. The central airways are clear. No mediastinal, hilar or axillary lymphadenopathy. LUNGS AND PLEURA: Mild to moderate bilateral pleural effusions, right greater than left. Consolidation with air bronchograms in the left lower lobe and posterior lingula concerning for pneumonia. Compressive atelectasis or pneumonia in the right lower lobe. Patchy ground glass airspace opacities also likely related to pneumonia. No pneumothorax. ABDOMEN AND PELVIS: LIVER: Mild nodular contours of the liver suggest cirrhosis. GALLBLADDER AND BILE DUCTS: High-density material within the gallbladder could reflect small stones or contrast material. No biliary ductal dilatation. SPLEEN: The spleen is mildly enlarged at 13 cm craniocaudal length. PANCREAS: No acute abnormality. ADRENAL GLANDS: No acute abnormality. KIDNEYS, URETERS AND BLADDER: 1.6 cm stone in the upper pole of the right kidney. No hydronephrosis. No perinephric or periureteral stranding. Urinary bladder is unremarkable. GI AND BOWEL: The stomach is decompressed but appears to be thick-walled. This could be related to portal gastropathy or gastritis. Sigmoid diverticulosis. No active diverticulitis. Moderate stool burden throughout the colon. There is no bowel obstruction. REPRODUCTIVE ORGANS: Prostate enlargement. PERITONEUM AND RETROPERITONEUM: Moderate free fluid in the abdomen and pelvis. No free air. VASCULATURE: Aorta is normal in caliber. Aortoiliac catheter sclerosis. ABDOMINAL AND PELVIS LYMPH NODES: No lymphadenopathy. BONES AND SOFT TISSUES: Stable severe compression fracture at L1. Diffuse osteopenia. No focal soft tissue abnormality. IMPRESSION: 1. Left lower lobe and lingular consolidation with patchy bilateral ground-glass opacities, most consistent with multifocal pneumonia. 2. Compressive atelectasis  versus additional pneumonia in the right lower lobe. 3. Mild to moderate bilateral pleural effusions, right greater than left. 4. Cirrhotic liver morphology with moderate abdominopelvic ascites and splenomegaly . 5. Gastric wall thickening may be related to portal gastropathy or gastritis . 6.  Right upper pole nephrolithiasis . 7. Coronary artery disease, aortic atherosclerosis. 8. Sigmoid diverticulosis. Moderate stool burden in the colon. Electronically signed by: Franky Crease MD 04/01/2024 01:36 AM EST RP Workstation: [id number]   DG Chest Port 1 View Result Date: 04/01/2024 EXAM: 1 VIEW(S) XRAY OF THE CHEST 04/01/2024 01:00:00 AM COMPARISON: Comparison from 03/24/2024. CLINICAL HISTORY: Increased lethargy and shortness of breath. FINDINGS: LUNGS AND PLEURA: Increasing left basilar infiltrate. Small left pleural effusion. No pneumothorax. HEART AND MEDIASTINUM: The cardiac shadow is prominent. The pacemaker is again seen and stable. BONES AND SOFT TISSUES: No bony abnormality is noted. IMPRESSION: 1. Increasing left basilar infiltrate and small effusion. 2. Prominent cardiac shadow with stable pacemaker. Electronically signed by: Oneil Devonshire MD 04/01/2024 01:04 AM EST RP Workstation: [id number]   DG Chest Port 1 View Result Date: 03/24/2024 CLINICAL DATA:  Respiratory distress. EXAM: PORTABLE CHEST 1 VIEW COMPARISON:  03/03/2024 FINDINGS: The cardio pericardial silhouette is enlarged. Retrocardiac atelectasis or infiltrate noted. Right lung clear. Single lead left-sided permanent pacemaker again noted. Telemetry leads overlie the chest. IMPRESSION: Retrocardiac atelectasis or infiltrate. Electronically Signed   By: Camellia Candle M.D.   On: 03/24/2024 07:22    Eric Nunnery, MD [facility]  If 7PM-7AM, please contact night-coverage [email or web address] Password [email or web address] 04/11/2024, 2:32 PM   LOS: 8 days   "

## 2024-04-11 NOTE — Plan of Care (Signed)
" °  Problem: Education: Goal: Knowledge of General Education information will improve Description: Including pain rating scale, medication(s)/side effects and non-pharmacologic comfort measures Outcome: Progressing   Problem: Clinical Measurements: Goal: Ability to maintain clinical measurements within normal limits will improve Outcome: Progressing Goal: Will remain free from infection Outcome: Progressing Goal: Diagnostic test results will improve Outcome: Progressing Goal: Respiratory complications will improve Outcome: Progressing Goal: Cardiovascular complication will be avoided Outcome: Progressing   Problem: Coping: Goal: Level of anxiety will decrease Outcome: Progressing   Problem: Pain Managment: Goal: General experience of comfort will improve and/or be controlled Outcome: Progressing   Problem: Safety: Goal: Ability to remain free from injury will improve Outcome: Progressing   Problem: Skin Integrity: Goal: Risk for impaired skin integrity will decrease Outcome: Progressing   Problem: Respiratory: Goal: Verbalizations of increased ease of respirations will increase Outcome: Progressing   Problem: Role Relationship: Goal: Family's ability to cope with current situation will improve Outcome: Progressing Goal: Ability to verbalize concerns, feelings, and thoughts to partner or family member will improve Outcome: Progressing   Problem: Pain Management: Goal: Satisfaction with pain management regimen will improve Outcome: Progressing   "

## 2024-04-12 ENCOUNTER — Ambulatory Visit: Payer: Medicare Other

## 2024-04-12 DIAGNOSIS — Z515 Encounter for palliative care: Secondary | ICD-10-CM | POA: Diagnosis not present

## 2024-04-12 NOTE — Progress Notes (Signed)
 "          PROGRESS NOTE  Jay Campbell FMW:[id number] DOB: Aug 12, 1945 DOA: 04/03/2024 PCP: [name], MD  Brief History:  79 y.o. male with medical history significant for Alzheimer's dementia, history of agitation due to dementia, coronary artery disease, permanent atrial fibrillation previously on Eliquis , complete heart block status post pacemaker placement, chronic thrombocytopenia, type 2 diabetes, hypertension, hyperlipidemia, GERD, BPH, who presents to the ER via EMS from SNF due to shortness of breath and generalized weakness.  The patient was discharged yesterday after being admitted for sepsis secondary to pneumonia and acute hypoxic respiratory failure requiring 2 L nasal cannula.  Staff at SNF reported the patient was more short of breath and lethargic than baseline today.  EMS was activated.  He received albuterol  nebs en route via EMS.   In the ER, hypoxic with O2 saturation of 87% on 2 L nasal cannula.  The patient is alert and confused in the setting of dementia.  Frail-appearing with pressure wounds in heels.  He is minimally verbal and unable to provide a history.     A chest x-ray revealed increasing left basilar infiltrates and small effusion.  Prominent cardiac shadow with stable pacemaker.  Lactic acid 3.9.  There was concern for sepsis.  Subsequently, his CT chest abdomen pelvis without contrast revealed left lower lobe and lingular consolidation with patchy bilateral ground glass opacities more consistent with multifocal pneumonia.  Compressive atelectasis versus additional pneumonia in the right lower lobe.  Mild to moderate bilateral pleural effusions, right greater than left.  Cirrhotic liver morphology with moderate abdominal pelvic ascites and splenomegaly.  Gastric wall thickening may be related to portal gastropathy or gastritis.  Right upper lobe nephrolithiasis.  Coronary artery disease, aortic atherosclerosis.  Sigmoid diverticulosis.  Moderate stool burden in the  colon.   The patient received 1 dose of cefepime  and azithromycin  in the ER.  TR[facility], hospitalist service, was asked to admit for worsening pneumonia.  Subsequently, the patient's antibiotics were discontinued as the patient received a full course of antibiotics during his last hospitalization from 03/24/2024 to 03/31/2024.  He remained afebrile and hemodynamically stable. Unfortunately, the patient's mental status did not improve much.  He was noted to be dehydrated with a free water  deficit.  Sodium went up to 155.  He was started on IV hypotonic fluid.  Although his sodium did gradually improve, the patient remained somnolent with minimal to no p.o. intake.  He continued to have dysphagia.  The patient was seen by speech therapy.  MBS was performed and did not reveal aspiration; however, patient was felt to be at continued risk for aspiration secondary to his impaired cognition and deconditioning.  A dysphagia 1 diet was ultimately recommended.  Palliative medicine was consulted.  After meetings with the patient's family, the patient's son was ultimately agreeable to transition to full comfort measures after the patient was accepted to residential hospice.   A bed was not available at [facility].  The patient was admitted to the hospital for hospital hospice care until bed is available at [facility].  Palliative medicine continue to follow.  They spoke with patient's son and verified desire to transition to full comfort measures.   Patient is stable and at the moment continue waiting for bed availability at the [place] house to continue symptomatic management and hospice care.  Will continue comfort care.  Assessment/Plan: Adult Failure to Thrive -- unfortunately, despite maximal medical interventions and recent prolonged hospitalization of 7  days he has bounced back into the hospital within 24 hours. -- Patient has significant dysphagia and unable to eat/drink enough to maintain body requirements for  fluids and nutrition -- Patient was initially started on IV antibiotics and IV fluids. --He did not improve clinically.  He continued to be somnolent with very poor to no oral intake -- continue DNR/DNI orders present on admission -Ultimately, patient's family agreed to transition to full comfort measures after the patient was accepted to residential hospice. - Patient now has been transitioned to general inpatient hospice and for comfort measures have been instituted. - added oral morphine  solution   Multifocal pneumonia, seen on CT scan -- Pneumonia was fully treated during previous hospitalization -- DC antibiotics -- Patient remained afebrile hemodynamically stable - Patient now has been transitioned to general inpatient hospice and for comfort measures have been instituted.   Severe Dysphagia -- SLP evaluation requested -- MBS done--no definitive aspiration -- Patient continued to remain high risk for aspiration -Dysphagia 1 diet ordered>> pleasure feeding -Oral intake remains very poor - Patient now has been transitioned to general inpatient hospice and for comfort measures have been instituted.   Bilateral mild to moderate pleural effusions, seen on CT scan -- Closely monitor volume status -- Secondary to hepatic hydrothorax -Cirrhotic liver noted on CT abdomen and pelvis   Chronic hypoxic respiratory failure secondary to the above -- Recently started on 2 L O2 supplementation during previous admission for pneumonia. -- Unfortunately supplemental oxygen  wasn't provided at facility -Stable on 2 to 3 L presently. - Patient now has been transitioned to general inpatient hospice and for comfort measures have been instituted.   Lactic acidosis -- Secondary to Hypoxia, hypoperfusion, volume depletion -- Lactic acid is trending down  -- Follow peripheral blood cultures x 2. -- IV fluid hydration  -- Sepsis has been ruled out   Elevated troponin, suspect demand ischemia  secondary to the above History of coronary artery disease Aortic atherosclerosis High-sensitivity troponin 23, 26. No evidence of acute ischemia on twelve-lead EKG No chest pain reported. Monitor on telemetry.   Chronic HFpEF Last 2D echo done on 05/09/2019 revealed LVEF 60 to 65% Presented with dyspnea, cardiomegaly, and bilateral pleural effusions -04/01/2024 echo EF 60 to 65%, no WMA, normal RVF - Patient now has been transitioned to general inpatient hospice and for comfort measures have been instituted.   Hypernatremia Speech therapy is consulted to rule out dysphagia Hypotonic IV fluid to correct free water  deficit --Patient now transition to full comfort measures   Hypokalemia -initially repleted -now on comfort measures   Gastric wall thickening, may be related to portal gastropathy or gastritis, seen on CT scan. IV PPI Protonix  40 mg daily initially No reported abdominal pain or GI bleed Avoid NSAIDs Patient may transition to full comfort measures   Generalized weakness He is chronically debilitated and this is unchanged Palliative consultation requested    Type 2 diabetes with hyperglycemia Last hemoglobin A1c 7.3 on 03/24/2024   Hypertension Resume home Lopressor  Closely monitor vital signs.   Alzheimer's dementia with history of agitation Resume home regimen. Aricept  held due to transient bradycardia. -now on comfort measures care   Permanent atrial fibrillation, no longer on Eliquis  due to chronic thrombocytopenia Currently rate controlled --- Patient now has been transitioned to general inpatient hospice and for comfort measures have been instituted.     Family Communication:   Family members updated at bedside   Consultants:  palliative   Code Status:  FULL  COMFORT   DVT Prophylaxis:  FULL COMFORT     Procedures: As Listed in Progress Note Above   Antibiotics: none  Subjective: In no major distress and appears to be comfortable.  Patient  is hemodynamically stable at the moment.  Objective: Vitals:   04/10/24 1357 04/11/24 0528 04/12/24 0556 04/12/24 1311  BP: 127/62 90/64 (!) 151/95 (!) 149/58  Pulse: 66 86 79 63  Resp: 18 18 15    Temp: (!) 97.4 F (36.3 C) 97.6 F (36.4 C) 98.1 F (36.7 C) 98.1 F (36.7 C)  TempSrc: Axillary Oral Oral Axillary  SpO2: 94% 91% 98% 96%    Intake/Output Summary (Last 24 hours) at 04/12/2024 1725 Last data filed at 04/12/2024 1311 Gross per 24 hour  Intake 0 ml  Output --  Net 0 ml    Weight change:   Exam: General exam: Afebrile, awake and in no major distress. Respiratory system: Fair air movement bilaterally; no using accessory muscles.  No wheezing or crackles. Cardiovascular system:RRR. No murmurs, rubs, gallops. Gastrointestinal system: Abdomen is nondistended, soft and nontender. No organomegaly or masses felt. Normal bowel sounds heard. Central nervous system: Limited examination.  No focal neurological deficits. Extremities: No cyanosis or clubbing.  Prevalon boots in place. Skin: No petechiae. Psychiatry: Flat affect.  Data Reviewed: I have personally reviewed following labs and imaging studies  Basic Metabolic Panel: No results for input(s): NA, K, CL, CO2, GLUCOSE, BUN, CREATININE, CALCIUM, MG, PHOS in the last 168 hours.  HbA1C: No results for input(s): HGBA1C in the last 72 hours.  Urine analysis:    Component Value Date/Time   COLORURINE COLORLESS (A) 04/01/2024 0658   APPEARANCEUR CLEAR 04/01/2024 0658   LABSPEC 1.008 04/01/2024 0658   PHURINE 5.0 04/01/2024 0658   GLUCOSEU NEGATIVE 04/01/2024 0658   HGBUR MODERATE (A) 04/01/2024 0658   BILIRUBINUR NEGATIVE 04/01/2024 0658   KETONESUR NEGATIVE 04/01/2024 0658   PROTEINUR NEGATIVE 04/01/2024 0658   NITRITE NEGATIVE 04/01/2024 0658   LEUKOCYTESUR NEGATIVE 04/01/2024 0658   Sepsis Labs:  No results found for this or any previous visit (from the past 240 hours).     Scheduled Meds:  antiseptic oral rinse  15 mL Mouth Rinse TID   Continuous Infusions:  Procedures/Studies: DG Swallowing Func-Speech Pathology Result Date: 04/02/2024 Table formatting from the original result was not included. Modified Barium Swallow Study Patient Details Name: Jay Campbell MRN: [id number] Date of Birth: [birth year] Today's Date: 04/02/2024 HPI/PMH: HPI: Jay Campbell is a 79 y.o. male with medical history significant for Alzheimer's dementia, history of agitation due to dementia, coronary artery disease, permanent atrial fibrillation previously on Eliquis , complete heart block status post pacemaker placement, chronic thrombocytopenia, type 2 diabetes, hypertension, hyperlipidemia, GERD, BPH, who presents to the ER via EMS from SNF due to shortness of breath and generalized weakness. The patient was discharged yesterday after being admitted for sepsis secondary to pneumonia and acute hypoxic respiratory failure requiring 2 L nasal cannula. Staff at SNF reported the patient was more short of breath and lethargic than baseline today. EMS was activated. He received albuterol  nebs en route via EMS. In the ER, hypoxic with O2 saturation of 87% on 2 L nasal cannula. The patient is alert and confused in the setting of dementia. Frail-appearing with pressure wounds in heels. He is minimally verbal and unable to provide a history. A chest x-ray revealed increasing left basilar infiltrates and small effusion. Prominent cardiac shadow with stable pacemaker. Lactic acid 3.9. There was concern for  sepsis. Subsequently, his CT chest abdomen pelvis without contrast revealed left lower lobe and lingular consolidation with patchy bilateral ground glass opacities more consistent with multifocal pneumonia. Compressive atelectasis versus additional pneumonia in the right lower lobe. Mild to moderate bilateral pleural effusions, right greater than left. Pt was seen by SLP service on Thursday and was  discharged over the weekend. BSE completed and pt exhibiting overt s/s of aspiration and placed NPO. MBS ordered to determine safest diet and assess swallow function objectively. Clinical Impression: Clinical Impression: Pt presents with mild oropharyngeal dysphagia compounded by cognitive-based dysphagia c/b escape to lateral buccal cavity/floor of mouth and slow, prolonged mastication with solids.  Delayed initiation of tongue motion (oral holding) and delayed initiation of the swallow (typical for age) at the level of the valleculae throughout study.  Diminished pharyngeal stripping wave and partial epiglottic inflection observed potentially d/t bulging PPW/?cervical osteophytes.  Decreased tongue base retraction resulting in diffuse residue within the vallecular space, pyriform sinuses and posterior pharyngeal wall intermittently during study.  It should be noted pt exhibited xerostomia prior to any po intake d/t NPO status which may have contributed to amount of residue within pharynx and other structures.  As the study progressed and compensatory strategies instilled including liquid wash, alternating solids/liquids and multiple effortful swallows, residue cleared to mild-insignificant amount within affected structures.  Partial distension/obstruction of flow observed in UES d/t prominent CP bar presence.  NO aspiration and/or penetration observed throughout study, but d/t pt's impaired cognition and deconditioning/dysphagia, he is at risk for aspiration if swallow strategies/A with meals/full supervision is not followed.  Recommend initiating a Dysphagia 1(puree)/thin liquid diet via tsp amounts with FULL supervision/A with meals.  ST will f/u for dysphagia tx/education during remainder of acute stay.  ST should f/u at next venue of care. Factors that may increase risk of adverse event in presence of aspiration Noe & Lianne 2021): Factors that may increase risk of adverse event in presence of aspiration  Noe & Lianne 2021): Poor general health and/or compromised immunity; Reduced cognitive function; Frail or deconditioned; Weak cough; Aspiration of thick, dense, and/or acidic materials Recommendations/Plan: Swallowing Evaluation Recommendations Swallowing Evaluation Recommendations Recommendations: PO diet PO Diet Recommendation: Dysphagia 1 (Pureed); Thin liquids (Level 0) Liquid Administration via: Spoon Medication Administration: Crushed with puree Supervision: Full assist for feeding; Full supervision/cueing for swallowing strategies Swallowing strategies  : Slow rate; Small bites/sips; Minimize environmental distractions; effortful swallow; Multiple dry swallows after each bite/sip; Follow solids with liquids Postural changes: Position pt fully upright for meals; Stay upright 30-60 min after meals Oral care recommendations: Oral care BID (2x/day); Staff/trained caregiver to provide oral care Treatment Plan Treatment Plan Treatment recommendations: Therapy as outlined in treatment plan below Follow-up recommendations: Skilled nursing-short term rehab (<3 hours/day) Functional status assessment: Patient has had a recent decline in their functional status and demonstrates the ability to make significant improvements in function in a reasonable and predictable amount of time. Treatment frequency: Min 2x/week Treatment duration: 1 week Interventions: Aspiration precaution training; Compensatory techniques; Patient/family education; Trials of upgraded texture/liquids; Diet toleration management by SLP Recommendations Recommendations for follow up therapy are one component of a multi-disciplinary discharge planning process, led by the attending physician.  Recommendations may be updated based on patient status, additional functional criteria and insurance authorization. Assessment: Orofacial Exam: Orofacial Exam Oral Cavity: Oral Hygiene: Xerostomia Oral Cavity - Dentition: Poor condition; Missing dentition  Orofacial Anatomy: WFL Oral Motor/Sensory Function: Generalized oral weakness Anatomy: Anatomy: WFL Boluses Administered: Boluses Administered  Boluses Administered: Thin liquids (Level 0); Mildly thick liquids (Level 2, nectar thick); Moderately thick liquids (Level 3, honey thick); Puree; Solid  Oral Impairment Domain: Oral Impairment Domain Lip Closure: No labial escape Tongue control during bolus hold: Escape to lateral buccal cavity/floor of mouth Bolus preparation/mastication: Slow prolonged chewing/mashing with complete recollection Bolus transport/lingual motion: Delayed initiation of tongue motion (oral holding) Oral residue: Trace residue lining oral structures Location of oral residue : Tongue Initiation of pharyngeal swallow : Valleculae  Pharyngeal Impairment Domain: Pharyngeal Impairment Domain Soft palate elevation: No bolus between soft palate (SP)/pharyngeal wall (PW) Laryngeal elevation: Complete superior movement of thyroid  cartilage with complete approximation of arytenoids to epiglottic petiole Anterior hyoid excursion: Complete anterior movement Epiglottic movement: Partial inversion Laryngeal vestibule closure: Complete, no air/contrast in laryngeal vestibule Pharyngeal stripping wave : Present - diminished Pharyngeal contraction (A/P view only): N/A Pharyngoesophageal segment opening: Partial distention/partial duration, partial obstruction of flow Tongue base retraction: Narrow column of contrast or air between tongue base and PPW Pharyngeal residue: Collection of residue within or on pharyngeal structures Location of pharyngeal residue: Diffuse (>3 areas)  Esophageal Impairment Domain: Esophageal Impairment Domain Esophageal clearance upright position: Complete clearance, esophageal coating Pill: Pill Consistency administered: -- (n/a) Penetration/Aspiration Scale Score: Penetration/Aspiration Scale Score 1.  Material does not enter airway: Thin liquids (Level 0); Mildly thick liquids (Level  2, nectar thick); Moderately thick liquids (Level 3, honey thick); Puree; Solid Compensatory Strategies: Compensatory Strategies Compensatory strategies: Yes Effortful swallow: Effective Effective Effortful Swallow: Thin liquid (Level 0); Mildly thick liquid (Level 2, nectar thick); Puree Multiple swallows: Effective Effective Multiple Swallows: Thin liquid (Level 0); Mildly thick liquid (Level 2, nectar thick); Puree Liquid wash: Effective Effective Liquid Wash: Thin liquid (Level 0); Mildly thick liquid (Level 2, nectar thick); Puree; Solid   General Information: Caregiver present: No  Diet Prior to this Study: NPO   Temperature : Normal   Respiratory Status: WFL   Supplemental O2: Nasal cannula (3L)   History of Recent Intubation: No  Behavior/Cognition: Alert; Cooperative; Distractible; Requires cueing Self-Feeding Abilities: Dependent for feeding Baseline vocal quality/speech: Hypophonia/low volume; Dysphonic Volitional Cough: Able to elicit Volitional Swallow: Unable to elicit Exam Limitations: No limitations Goal Planning: Prognosis for improved oropharyngeal function: Good Barriers to Reach Goals: Cognitive deficits No data recorded Patient/Family Stated Goal: n/a Consulted and agree with results and recommendations: Patient; Pt unable/family or caregiver not available; Nurse; Physician Pain: Pain Assessment Pain Assessment: Faces Faces Pain Scale: 2 Breathing: 0 Negative Vocalization: 1 Facial Expression: 1 Body Language: 0 Consolability: 0 PAINAD Score: 0 Pain Location: generalized with movement Pain Descriptors / Indicators: Discomfort; Grimacing Pain Intervention(s): Limited activity within patient's tolerance; Repositioned End of Session: Start Time:SLP Start Time (ACUTE ONLY): 1014 Stop Time: SLP Stop Time (ACUTE ONLY): 1050 Time Calculation:SLP Time Calculation (min) (ACUTE ONLY): 36 min Charges: SLP Evaluations $ SLP Speech Visit: 1 Visit SLP Evaluations $BSS Swallow: 1 Procedure $MBS Swallow: 1  Procedure SLP visit diagnosis: SLP Visit Diagnosis: Dysphagia, oropharyngeal phase (R13.12) Past Medical History: Past Medical History: Diagnosis Date  Alzheimer's dementia (HCC)   Anxiety   Atrial fibrillation (HCC)   Permanent  Coronary artery disease   Mild nonobstructive 1/12  DM (diabetes mellitus) (HCC)   GERD (gastroesophageal reflux disease)   Gout   Hearing disorder, cochlear   Hiatal hernia   HLD (hyperlipidemia)   HTN (hypertension)   S/P AV nodal ablation   s/p SJM PPM; gen change 02-01-13 by Dr Kelsie  Smoldering multiple myeloma  12/29/2017  Warfarin anticoagulation  Past Surgical History: Past Surgical History: Procedure Laterality Date  CATARACT EXTRACTION Right   COCHLEAR IMPLANT Right   HEMORRHOID BANDING    KNEE ARTHROSCOPY Right 1992  Dr Brenna   PACEMAKER GENERATOR CHANGE Bilateral 02/01/2013  Procedure: PACEMAKER GENERATOR CHANGE;  Surgeon: Lynwood JONETTA Rakers, MD;  Location: [facility] CATH LAB;  Service: Cardiovascular;  Laterality: Bilateral;  PACEMAKER PLACEMENT  2002; 2014  SJM implanted by Dr Waddell with AV nodal ablation performed; gen change to [employer] SR RF by Dr Rakers 02-01-13  RETINAL LASER PROCEDURE Left   SLT LASER APPLICATION Left 12/14/2015  Procedure: SLT LASER APPLICATION;  Surgeon: Dow JULIANNA Burke, MD;  Location: [facility];  Service: Ophthalmology;  Laterality: Left; Pat Adams,M.S.,CCC-SLP 04/02/2024, 11:23 AM  ECHOCARDIOGRAM COMPLETE Result Date: 04/01/2024    ECHOCARDIOGRAM REPORT   Patient Name:   Jay Campbell Date of Exam: 04/01/2024 Medical Rec #:  [id number]        Height:       65.0 in Accession #:    [id number]       Weight:       175.7 lb Date of Birth:  November 25, 1945        BSA:          1.872 m Patient Age:    78 years         BP:           158/69 mmHg Patient Gender: M                HR:           63 bpm. Exam Location:  [facility] Procedure: 2D Echo, Cardiac Doppler and Color Doppler (Both Spectral and Color            Flow Doppler were utilized during procedure). Indications:     CHF-Acute Diastolic I50.31  History:        Patient has prior history of Echocardiogram examinations, most                 recent 05/09/2019. CAD, Pacemaker, Arrythmias:Atrial Fibrillation                 and Complete heart block (HCC); Risk Factors:Hypertension,                 Diabetes, Dyslipidemia and Non-Smoker. Alzheimer's dementia                 (HCC) (From Hx).  Sonographer:    Aida Pizza RCS Referring Phys: [id number] TERRY LOISE HURST  Sonographer Comments: Image acquisition challenging due to patient behavioral factors. IMPRESSIONS  1. Left ventricular ejection fraction, by estimation, is 60 to 65%. The left ventricle has normal function. The left ventricle has no regional wall motion abnormalities. There is mild left ventricular hypertrophy. Left ventricular diastolic parameters are indeterminate.  2. Right ventricular systolic function is normal. The right ventricular size is mildly enlarged. There is normal pulmonary artery systolic pressure.  3. Left atrial size was severely dilated.  4. Right atrial size was severely dilated.  5. The mitral valve is abnormal. Mild to moderate mitral valve regurgitation. No evidence of mitral stenosis. Severe mitral annular calcification.  6. The aortic valve is tricuspid. There is mild calcification of the aortic valve. There is mild thickening of the aortic valve. Aortic valve regurgitation is not visualized. No aortic stenosis is present. Aortic valve mean gradient measures 6.0 mmHg.  7. Aortic dilatation noted. There is borderline dilatation  of the aortic root, measuring 39 mm.  8. The inferior vena cava is normal in size with <50% respiratory variability, suggesting right atrial pressure of 8 mmHg. Comparison(s): No significant change from prior study. FINDINGS  Left Ventricle: Left ventricular ejection fraction, by estimation, is 60 to 65%. The left ventricle has normal function. The left ventricle has no regional wall motion abnormalities. Strain was performed and  the global longitudinal strain is indeterminate. The left ventricular internal cavity size was normal in size. There is mild left ventricular hypertrophy. Left ventricular diastolic parameters are indeterminate. Right Ventricle: The right ventricular size is mildly enlarged. No increase in right ventricular wall thickness. Right ventricular systolic function is normal. There is normal pulmonary artery systolic pressure. The tricuspid regurgitant velocity is 2.34  m/s, and with an assumed right atrial pressure of 8 mmHg, the estimated right ventricular systolic pressure is 29.9 mmHg. Left Atrium: Left atrial size was severely dilated. Right Atrium: Right atrial size was severely dilated. Pericardium: There is no evidence of pericardial effusion. Mitral Valve: The mitral valve is abnormal. Severe mitral annular calcification. Mild to moderate mitral valve regurgitation. No evidence of mitral valve stenosis. Tricuspid Valve: The tricuspid valve is normal in structure. Tricuspid valve regurgitation is mild . No evidence of tricuspid stenosis. Aortic Valve: The aortic valve is tricuspid. There is mild calcification of the aortic valve. There is mild thickening of the aortic valve. Aortic valve regurgitation is not visualized. No aortic stenosis is present. Aortic valve mean gradient measures 6.0 mmHg. Aortic valve peak gradient measures 11.3 mmHg. Aortic valve area, by VTI measures 1.90 cm. Pulmonic Valve: The pulmonic valve was normal in structure. Pulmonic valve regurgitation is trivial. No evidence of pulmonic stenosis. Aorta: Aortic dilatation noted. There is borderline dilatation of the aortic root, measuring 39 mm. Venous: The inferior vena cava is normal in size with less than 50% respiratory variability, suggesting right atrial pressure of 8 mmHg. IAS/Shunts: No atrial level shunt detected by color flow Doppler. Additional Comments: 3D was performed not requiring image post processing on an independent  workstation and was indeterminate.  LEFT VENTRICLE PLAX 2D LVIDd:         4.80 cm LVIDs:         3.00 cm LV PW:         1.20 cm LV IVS:        1.20 cm LVOT diam:     2.10 cm LV SV:         56 LV SV Index:   30 LVOT Area:     3.46 cm  RIGHT VENTRICLE RV S prime:     14.30 cm/s TAPSE (M-mode): 2.7 cm LEFT ATRIUM              Index        RIGHT ATRIUM           Index LA diam:        5.90 cm  3.15 cm/m   RA Area:     38.35 cm LA Vol (A2C):   138.5 ml 73.98 ml/m  RA Volume:   150.00 ml 80.12 ml/m LA Vol (A4C):   182.0 ml 97.21 ml/m LA Biplane Vol: 165.0 ml 88.13 ml/m  AORTIC VALVE AV Area (Vmax):    1.64 cm AV Area (Vmean):   1.83 cm AV Area (VTI):     1.90 cm AV Vmax:           168.00 cm/s AV Vmean:  106.000 cm/s AV VTI:            0.294 m AV Peak Grad:      11.3 mmHg AV Mean Grad:      6.0 mmHg LVOT Vmax:         79.40 cm/s LVOT Vmean:        56.100 cm/s LVOT VTI:          0.161 m LVOT/AV VTI ratio: 0.55  AORTA Ao Root diam: 3.90 cm MITRAL VALVE                TRICUSPID VALVE MV Area (PHT): 3.27 cm     TR Peak grad:   21.9 mmHg MV Decel Time: 232 msec     TR Vmax:        234.00 cm/s MR Peak grad: 119.7 mmHg MR Mean grad: 75.0 mmHg     SHUNTS MR Vmax:      547.00 cm/s   Systemic VTI:  0.16 m MR Vmean:     401.0 cm/s    Systemic Diam: 2.10 cm MV E velocity: 107.00 cm/s MV A velocity: 33.30 cm/s MV E/A ratio:  3.21 Vishnu Priya Mallipeddi Electronically signed by Diannah Late Mallipeddi Signature Date/Time: 04/01/2024/2:10:59 PM    Final    CT CHEST ABDOMEN PELVIS WO CONTRAST Result Date: 04/01/2024 EXAM: CT CHEST, ABDOMEN AND PELVIS WITHOUT CONTRAST 04/01/2024 01:25:10 AM TECHNIQUE: CT of the chest, abdomen and pelvis was performed without the administration of intravenous contrast. Multiplanar reformatted images are provided for review. Automated exposure control, iterative reconstruction, and/or weight based adjustment of the mA/kV was utilized to reduce the radiation dose to as low as reasonably  achievable. COMPARISON: 02/07/2023. CLINICAL HISTORY: Sepsis. FINDINGS: CHEST: MEDIASTINUM AND LYMPH NODES: Cardiomegaly. Pacer wires in the right heart. Coronary artery aortic atherosclerosis. The central airways are clear. No mediastinal, hilar or axillary lymphadenopathy. LUNGS AND PLEURA: Mild to moderate bilateral pleural effusions, right greater than left. Consolidation with air bronchograms in the left lower lobe and posterior lingula concerning for pneumonia. Compressive atelectasis or pneumonia in the right lower lobe. Patchy ground glass airspace opacities also likely related to pneumonia. No pneumothorax. ABDOMEN AND PELVIS: LIVER: Mild nodular contours of the liver suggest cirrhosis. GALLBLADDER AND BILE DUCTS: High-density material within the gallbladder could reflect small stones or contrast material. No biliary ductal dilatation. SPLEEN: The spleen is mildly enlarged at 13 cm craniocaudal length. PANCREAS: No acute abnormality. ADRENAL GLANDS: No acute abnormality. KIDNEYS, URETERS AND BLADDER: 1.6 cm stone in the upper pole of the right kidney. No hydronephrosis. No perinephric or periureteral stranding. Urinary bladder is unremarkable. GI AND BOWEL: The stomach is decompressed but appears to be thick-walled. This could be related to portal gastropathy or gastritis. Sigmoid diverticulosis. No active diverticulitis. Moderate stool burden throughout the colon. There is no bowel obstruction. REPRODUCTIVE ORGANS: Prostate enlargement. PERITONEUM AND RETROPERITONEUM: Moderate free fluid in the abdomen and pelvis. No free air. VASCULATURE: Aorta is normal in caliber. Aortoiliac catheter sclerosis. ABDOMINAL AND PELVIS LYMPH NODES: No lymphadenopathy. BONES AND SOFT TISSUES: Stable severe compression fracture at L1. Diffuse osteopenia. No focal soft tissue abnormality. IMPRESSION: 1. Left lower lobe and lingular consolidation with patchy bilateral ground-glass opacities, most consistent with multifocal  pneumonia. 2. Compressive atelectasis versus additional pneumonia in the right lower lobe. 3. Mild to moderate bilateral pleural effusions, right greater than left. 4. Cirrhotic liver morphology with moderate abdominopelvic ascites and splenomegaly . 5. Gastric wall thickening may be related to portal gastropathy or  gastritis . 6. Right upper pole nephrolithiasis . 7. Coronary artery disease, aortic atherosclerosis. 8. Sigmoid diverticulosis. Moderate stool burden in the colon. Electronically signed by: Franky Crease MD 04/01/2024 01:36 AM EST RP Workstation: [id number]   DG Chest Port 1 View Result Date: 04/01/2024 EXAM: 1 VIEW(S) XRAY OF THE CHEST 04/01/2024 01:00:00 AM COMPARISON: Comparison from 03/24/2024. CLINICAL HISTORY: Increased lethargy and shortness of breath. FINDINGS: LUNGS AND PLEURA: Increasing left basilar infiltrate. Small left pleural effusion. No pneumothorax. HEART AND MEDIASTINUM: The cardiac shadow is prominent. The pacemaker is again seen and stable. BONES AND SOFT TISSUES: No bony abnormality is noted. IMPRESSION: 1. Increasing left basilar infiltrate and small effusion. 2. Prominent cardiac shadow with stable pacemaker. Electronically signed by: Oneil Devonshire MD 04/01/2024 01:04 AM EST RP Workstation: [id number]   DG Chest Port 1 View Result Date: 03/24/2024 CLINICAL DATA:  Respiratory distress. EXAM: PORTABLE CHEST 1 VIEW COMPARISON:  03/03/2024 FINDINGS: The cardio pericardial silhouette is enlarged. Retrocardiac atelectasis or infiltrate noted. Right lung clear. Single lead left-sided permanent pacemaker again noted. Telemetry leads overlie the chest. IMPRESSION: Retrocardiac atelectasis or infiltrate. Electronically Signed   By: Camellia Candle M.D.   On: 03/24/2024 07:22    Eric Nunnery, MD [facility]  If 7PM-7AM, please contact night-coverage [email or web address] Password [email or web address] 04/12/2024, 5:25 PM   LOS: 9 days   "

## 2024-04-12 NOTE — Plan of Care (Signed)
   Problem: Coping: Goal: Level of anxiety will decrease Outcome: Progressing   Problem: Elimination: Goal: Will not experience complications related to bowel motility Outcome: Progressing Goal: Will not experience complications related to urinary retention Outcome: Progressing

## 2024-04-12 NOTE — Plan of Care (Signed)
" °  Problem: Education: Goal: Knowledge of General Education information will improve Description: Including pain rating scale, medication(s)/side effects and non-pharmacologic comfort measures 04/12/2024 1611 by Lynnette Cena CROME, RN Outcome: Progressing 04/12/2024 1611 by Lynnette Cena CROME, RN Outcome: Progressing   "

## 2024-04-12 NOTE — Plan of Care (Signed)
   Problem: Education: Goal: Knowledge of General Education information will improve Description Including pain rating scale, medication(s)/side effects and non-pharmacologic comfort measures Outcome: Progressing

## 2024-04-13 NOTE — Plan of Care (Signed)
" °  Problem: Education: Goal: Knowledge of General Education information will improve Description: Including pain rating scale, medication(s)/side effects and non-pharmacologic comfort measures Outcome: Progressing   Problem: Clinical Measurements: Goal: Ability to maintain clinical measurements within normal limits will improve Outcome: Progressing Goal: Will remain free from infection Outcome: Progressing Goal: Diagnostic test results will improve Outcome: Progressing Goal: Respiratory complications will improve Outcome: Progressing Goal: Cardiovascular complication will be avoided Outcome: Progressing   Problem: Coping: Goal: Level of anxiety will decrease Outcome: Progressing   Problem: Pain Managment: Goal: General experience of comfort will improve and/or be controlled Outcome: Progressing   Problem: Safety: Goal: Ability to remain free from injury will improve Outcome: Progressing   Problem: Respiratory: Goal: Verbalizations of increased ease of respirations will increase Outcome: Progressing   Problem: Role Relationship: Goal: Family's ability to cope with current situation will improve Outcome: Progressing Goal: Ability to verbalize concerns, feelings, and thoughts to partner or family member will improve Outcome: Progressing   Problem: Pain Management: Goal: Satisfaction with pain management regimen will improve Outcome: Progressing   "

## 2024-04-13 NOTE — Plan of Care (Signed)

## 2024-04-14 NOTE — Progress Notes (Signed)
 "          PROGRESS NOTE  Jay Campbell FMW:[id number] DOB: [birth year] DOA: 04/03/2024 PCP: [name], MD  Brief History:  79 y.o. male with medical history significant for Alzheimer's dementia, history of agitation due to dementia, coronary artery disease, permanent atrial fibrillation previously on Eliquis , complete heart block status post pacemaker placement, chronic thrombocytopenia, type 2 diabetes, hypertension, hyperlipidemia, GERD, BPH, who presents to the ER via EMS from SNF due to shortness of breath and generalized weakness.  The patient was discharged yesterday after being admitted for sepsis secondary to pneumonia and acute hypoxic respiratory failure requiring 2 L nasal cannula.  Staff at SNF reported the patient was more short of breath and lethargic than baseline today.  EMS was activated.  He received albuterol  nebs en route via EMS.   In the ER, hypoxic with O2 saturation of 87% on 2 L nasal cannula.  The patient is alert and confused in the setting of dementia.  Frail-appearing with pressure wounds in heels.  He is minimally verbal and unable to provide a history.     A chest x-ray revealed increasing left basilar infiltrates and small effusion.  Prominent cardiac shadow with stable pacemaker.  Lactic acid 3.9.  There was concern for sepsis.  Subsequently, his CT chest abdomen pelvis without contrast revealed left lower lobe and lingular consolidation with patchy bilateral ground glass opacities more consistent with multifocal pneumonia.  Compressive atelectasis versus additional pneumonia in the right lower lobe.  Mild to moderate bilateral pleural effusions, right greater than left.  Cirrhotic liver morphology with moderate abdominal pelvic ascites and splenomegaly.  Gastric wall thickening may be related to portal gastropathy or gastritis.  Right upper lobe nephrolithiasis.  Coronary artery disease, aortic atherosclerosis.  Sigmoid diverticulosis.  Moderate stool burden in the  colon.   The patient received 1 dose of cefepime  and azithromycin  in the ER.  TRH, hospitalist service, was asked to admit for worsening pneumonia.  Subsequently, the patient's antibiotics were discontinued as the patient received a full course of antibiotics during his last hospitalization from 03/24/2024 to 03/31/2024.  He remained afebrile and hemodynamically stable. Unfortunately, the patient's mental status did not improve much.  He was noted to be dehydrated with a free water  deficit.  Sodium went up to 155.  He was started on IV hypotonic fluid.  Although his sodium did gradually improve, the patient remained somnolent with minimal to no p.o. intake.  He continued to have dysphagia.  The patient was seen by speech therapy.  MBS was performed and did not reveal aspiration; however, patient was felt to be at continued risk for aspiration secondary to his impaired cognition and deconditioning.  A dysphagia 1 diet was ultimately recommended.  Palliative medicine was consulted.  After meetings with the patient's family, the patient's son was ultimately agreeable to transition to full comfort measures after the patient was accepted to residential hospice.   A bed was not available at [facility].  The patient was admitted to the hospital for hospital hospice care until bed is available at [facility] house.  Palliative medicine continue to follow.  They spoke with patient's son and verified desire to transition to full comfort measures.   Patient is stable and at the moment continue waiting for bed availability at the [place] house to continue symptomatic management and hospice care.  Will continue comfort care.  Assessment/Plan: Adult Failure to Thrive -- unfortunately, despite maximal medical interventions and recent prolonged hospitalization of 7  days he has bounced back into the hospital within 24 hours. -- Patient has significant dysphagia and unable to eat/drink enough to maintain body requirements for  fluids and nutrition -- Patient was initially started on IV antibiotics and IV fluids. --He did not improve clinically.  He continued to be somnolent with very poor to no oral intake -- continue DNR/DNI orders present on admission -Ultimately, patient's family agreed to transition to full comfort measures after the patient was accepted to residential hospice. - Patient now has been transitioned to general inpatient hospice and for comfort measures have been instituted. - added oral morphine  solution   Multifocal pneumonia, seen on CT scan -- Pneumonia was fully treated during previous hospitalization -- DC antibiotics -- Patient remained afebrile hemodynamically stable - Patient now has been transitioned to general inpatient hospice and for comfort measures have been instituted.   Severe Dysphagia -- SLP evaluation requested -- MBS done--no definitive aspiration -- Patient continued to remain high risk for aspiration -Dysphagia 1 diet ordered>> pleasure feeding -Oral intake remains very poor - Patient now has been transitioned to general inpatient hospice and for comfort measures have been instituted.   Bilateral mild to moderate pleural effusions, seen on CT scan -- Closely monitor volume status -- Secondary to hepatic hydrothorax -Cirrhotic liver noted on CT abdomen and pelvis   Chronic hypoxic respiratory failure secondary to the above -- Recently started on 2 L O2 supplementation during previous admission for pneumonia. -- Unfortunately supplemental oxygen  wasn't provided at facility -Stable on 2 to 3 L presently. - Patient now has been transitioned to general inpatient hospice and for comfort measures have been instituted.   Lactic acidosis -- Secondary to Hypoxia, hypoperfusion, volume depletion -- Lactic acid is trending down  -- Follow peripheral blood cultures x 2. -- IV fluid hydration  -- Sepsis has been ruled out   Elevated troponin, suspect demand ischemia  secondary to the above History of coronary artery disease Aortic atherosclerosis High-sensitivity troponin 23, 26. No evidence of acute ischemia on twelve-lead EKG No chest pain reported. Monitor on telemetry.   Chronic HFpEF Last 2D echo done on 05/09/2019 revealed LVEF 60 to 65% Presented with dyspnea, cardiomegaly, and bilateral pleural effusions -04/01/2024 echo EF 60 to 65%, no WMA, normal RVF - Patient now has been transitioned to general inpatient hospice and for comfort measures have been instituted.   Hypernatremia Speech therapy is consulted to rule out dysphagia Hypotonic IV fluid to correct free water  deficit --Patient now transition to full comfort measures   Hypokalemia -initially repleted -now on comfort measures   Gastric wall thickening, may be related to portal gastropathy or gastritis, seen on CT scan. IV PPI Protonix  40 mg daily initially No reported abdominal pain or GI bleed Avoid NSAIDs Patient may transition to full comfort measures   Generalized weakness He is chronically debilitated and this is unchanged Palliative consultation requested    Type 2 diabetes with hyperglycemia Last hemoglobin A1c 7.3 on 03/24/2024   Hypertension Resume home Lopressor  Closely monitor vital signs.   Alzheimer's dementia with history of agitation Resume home regimen. Aricept  held due to transient bradycardia. -now on comfort measures care   Permanent atrial fibrillation, no longer on Eliquis  due to chronic thrombocytopenia Currently rate controlled --- Patient now has been transitioned to general inpatient hospice and for comfort measures have been instituted.     Family Communication:   Family members updated at bedside   Consultants:  palliative   Code Status:  FULL  COMFORT   DVT Prophylaxis:  FULL COMFORT     Procedures: As Listed in Progress Note Above   Antibiotics: none  Subjective: No overnight events; remains to be in no acute distress and at  the moment appears comfortable.  Stable vital signs.  Waiting for bed availability at the [facility]   Objective: Vitals:   04/11/24 0528 04/12/24 0556 04/12/24 1311 04/13/24 2202  BP: 90/64 (!) 151/95 (!) 149/58 (!) 161/66  Pulse: 86 79 63 65  Resp: 18 15  15   Temp: 97.6 F (36.4 C) 98.1 F (36.7 C) 98.1 F (36.7 C) 98.1 F (36.7 C)  TempSrc: Oral Oral Axillary Oral  SpO2: 91% 98% 96% 98%    Intake/Output Summary (Last 24 hours) at 04/14/2024 9251 Last data filed at 04/14/2024 0127 Gross per 24 hour  Intake --  Output 225 ml  Net -225 ml    Weight change:   Exam: General exam: Appears to be in no acute distress. Respiratory system: No using accessory muscles. Cardiovascular system: No rubs, no gallops, no JVD. Gastrointestinal system: Abdomen is nondistended, soft and nontender. No organomegaly or masses felt. Normal bowel sounds heard. Central nervous system: Able to move 4 limbs spontaneously. Extremities: No cyanosis or clubbing. Skin: No petechiae.  Prevalon boots in place. Psychiatry: Flat affect.  Data Reviewed: I have personally reviewed following labs and imaging studies  Basic Metabolic Panel: No results for input(s): NA, K, CL, CO2, GLUCOSE, BUN, CREATININE, CALCIUM, MG, PHOS in the last 168 hours.  HbA1C: No results for input(s): HGBA1C in the last 72 hours.  Urine analysis:    Component Value Date/Time   COLORURINE COLORLESS (A) 04/01/2024 0658   APPEARANCEUR CLEAR 04/01/2024 0658   LABSPEC 1.008 04/01/2024 0658   PHURINE 5.0 04/01/2024 0658   GLUCOSEU NEGATIVE 04/01/2024 0658   HGBUR MODERATE (A) 04/01/2024 0658   BILIRUBINUR NEGATIVE 04/01/2024 0658   KETONESUR NEGATIVE 04/01/2024 0658   PROTEINUR NEGATIVE 04/01/2024 0658   NITRITE NEGATIVE 04/01/2024 0658   LEUKOCYTESUR NEGATIVE 04/01/2024 0658   Sepsis Labs:  No results found for this or any previous visit (from the past 240 hours).    Scheduled Meds:  antiseptic oral  rinse  15 mL Mouth Rinse TID   Continuous Infusions:  Procedures/Studies: DG Swallowing Func-Speech Pathology Result Date: 04/02/2024 Table formatting from the original result was not included. Modified Barium Swallow Study Patient Details Name: Jay Campbell MRN: [id number] Date of Birth: 03/11/1946 Today's Date: 04/02/2024 HPI/PMH: HPI: Jay Campbell is a 79 y.o. male with medical history significant for Alzheimer's dementia, history of agitation due to dementia, coronary artery disease, permanent atrial fibrillation previously on Eliquis , complete heart block status post pacemaker placement, chronic thrombocytopenia, type 2 diabetes, hypertension, hyperlipidemia, GERD, BPH, who presents to the ER via EMS from SNF due to shortness of breath and generalized weakness. The patient was discharged yesterday after being admitted for sepsis secondary to pneumonia and acute hypoxic respiratory failure requiring 2 L nasal cannula. Staff at SNF reported the patient was more short of breath and lethargic than baseline today. EMS was activated. He received albuterol  nebs en route via EMS. In the ER, hypoxic with O2 saturation of 87% on 2 L nasal cannula. The patient is alert and confused in the setting of dementia. Frail-appearing with pressure wounds in heels. He is minimally verbal and unable to provide a history. A chest x-ray revealed increasing left basilar infiltrates and small effusion. Prominent cardiac shadow with stable pacemaker. Lactic acid 3.9.  There was concern for sepsis. Subsequently, his CT chest abdomen pelvis without contrast revealed left lower lobe and lingular consolidation with patchy bilateral ground glass opacities more consistent with multifocal pneumonia. Compressive atelectasis versus additional pneumonia in the right lower lobe. Mild to moderate bilateral pleural effusions, right greater than left. Pt was seen by SLP service on Thursday and was discharged over the weekend. BSE completed  and pt exhibiting overt s/s of aspiration and placed NPO. MBS ordered to determine safest diet and assess swallow function objectively. Clinical Impression: Clinical Impression: Pt presents with mild oropharyngeal dysphagia compounded by cognitive-based dysphagia c/b escape to lateral buccal cavity/floor of mouth and slow, prolonged mastication with solids.  Delayed initiation of tongue motion (oral holding) and delayed initiation of the swallow (typical for age) at the level of the valleculae throughout study.  Diminished pharyngeal stripping wave and partial epiglottic inflection observed potentially d/t bulging PPW/?cervical osteophytes.  Decreased tongue base retraction resulting in diffuse residue within the vallecular space, pyriform sinuses and posterior pharyngeal wall intermittently during study.  It should be noted pt exhibited xerostomia prior to any po intake d/t NPO status which may have contributed to amount of residue within pharynx and other structures.  As the study progressed and compensatory strategies instilled including liquid wash, alternating solids/liquids and multiple effortful swallows, residue cleared to mild-insignificant amount within affected structures.  Partial distension/obstruction of flow observed in UES d/t prominent CP bar presence.  NO aspiration and/or penetration observed throughout study, but d/t pt's impaired cognition and deconditioning/dysphagia, he is at risk for aspiration if swallow strategies/A with meals/full supervision is not followed.  Recommend initiating a Dysphagia 1(puree)/thin liquid diet via tsp amounts with FULL supervision/A with meals.  ST will f/u for dysphagia tx/education during remainder of acute stay.  ST should f/u at next venue of care. Factors that may increase risk of adverse event in presence of aspiration Noe & Lianne 2021): Factors that may increase risk of adverse event in presence of aspiration Noe & Lianne 2021): Poor general  health and/or compromised immunity; Reduced cognitive function; Frail or deconditioned; Weak cough; Aspiration of thick, dense, and/or acidic materials Recommendations/Plan: Swallowing Evaluation Recommendations Swallowing Evaluation Recommendations Recommendations: PO diet PO Diet Recommendation: Dysphagia 1 (Pureed); Thin liquids (Level 0) Liquid Administration via: Spoon Medication Administration: Crushed with puree Supervision: Full assist for feeding; Full supervision/cueing for swallowing strategies Swallowing strategies  : Slow rate; Small bites/sips; Minimize environmental distractions; effortful swallow; Multiple dry swallows after each bite/sip; Follow solids with liquids Postural changes: Position pt fully upright for meals; Stay upright 30-60 min after meals Oral care recommendations: Oral care BID (2x/day); Staff/trained caregiver to provide oral care Treatment Plan Treatment Plan Treatment recommendations: Therapy as outlined in treatment plan below Follow-up recommendations: Skilled nursing-short term rehab (<3 hours/day) Functional status assessment: Patient has had a recent decline in their functional status and demonstrates the ability to make significant improvements in function in a reasonable and predictable amount of time. Treatment frequency: Min 2x/week Treatment duration: 1 week Interventions: Aspiration precaution training; Compensatory techniques; Patient/family education; Trials of upgraded texture/liquids; Diet toleration management by SLP Recommendations Recommendations for follow up therapy are one component of a multi-disciplinary discharge planning process, led by the attending physician.  Recommendations may be updated based on patient status, additional functional criteria and insurance authorization. Assessment: Orofacial Exam: Orofacial Exam Oral Cavity: Oral Hygiene: Xerostomia Oral Cavity - Dentition: Poor condition; Missing dentition Orofacial Anatomy: WFL Oral Motor/Sensory  Function: Generalized oral weakness Anatomy: Anatomy: [facility]  Boluses Administered: Boluses Administered Boluses Administered: Thin liquids (Level 0); Mildly thick liquids (Level 2, nectar thick); Moderately thick liquids (Level 3, honey thick); Puree; Solid  Oral Impairment Domain: Oral Impairment Domain Lip Closure: No labial escape Tongue control during bolus hold: Escape to lateral buccal cavity/floor of mouth Bolus preparation/mastication: Slow prolonged chewing/mashing with complete recollection Bolus transport/lingual motion: Delayed initiation of tongue motion (oral holding) Oral residue: Trace residue lining oral structures Location of oral residue : Tongue Initiation of pharyngeal swallow : Valleculae  Pharyngeal Impairment Domain: Pharyngeal Impairment Domain Soft palate elevation: No bolus between soft palate (SP)/pharyngeal wall (PW) Laryngeal elevation: Complete superior movement of thyroid  cartilage with complete approximation of arytenoids to epiglottic petiole Anterior hyoid excursion: Complete anterior movement Epiglottic movement: Partial inversion Laryngeal vestibule closure: Complete, no air/contrast in laryngeal vestibule Pharyngeal stripping wave : Present - diminished Pharyngeal contraction (A/P view only): N/A Pharyngoesophageal segment opening: Partial distention/partial duration, partial obstruction of flow Tongue base retraction: Narrow column of contrast or air between tongue base and PPW Pharyngeal residue: Collection of residue within or on pharyngeal structures Location of pharyngeal residue: Diffuse (>3 areas)  Esophageal Impairment Domain: Esophageal Impairment Domain Esophageal clearance upright position: Complete clearance, esophageal coating Pill: Pill Consistency administered: -- (n/a) Penetration/Aspiration Scale Score: Penetration/Aspiration Scale Score 1.  Material does not enter airway: Thin liquids (Level 0); Mildly thick liquids (Level 2, nectar thick); Moderately thick  liquids (Level 3, honey thick); Puree; Solid Compensatory Strategies: Compensatory Strategies Compensatory strategies: Yes Effortful swallow: Effective Effective Effortful Swallow: Thin liquid (Level 0); Mildly thick liquid (Level 2, nectar thick); Puree Multiple swallows: Effective Effective Multiple Swallows: Thin liquid (Level 0); Mildly thick liquid (Level 2, nectar thick); Puree Liquid wash: Effective Effective Liquid Wash: Thin liquid (Level 0); Mildly thick liquid (Level 2, nectar thick); Puree; Solid   General Information: Caregiver present: No  Diet Prior to this Study: NPO   Temperature : Normal   Respiratory Status: WFL   Supplemental O2: Nasal cannula (3L)   History of Recent Intubation: No  Behavior/Cognition: Alert; Cooperative; Distractible; Requires cueing Self-Feeding Abilities: Dependent for feeding Baseline vocal quality/speech: Hypophonia/low volume; Dysphonic Volitional Cough: Able to elicit Volitional Swallow: Unable to elicit Exam Limitations: No limitations Goal Planning: Prognosis for improved oropharyngeal function: Good Barriers to Reach Goals: Cognitive deficits No data recorded Patient/Family Stated Goal: n/a Consulted and agree with results and recommendations: Patient; Pt unable/family or caregiver not available; Nurse; Physician Pain: Pain Assessment Pain Assessment: Faces Faces Pain Scale: 2 Breathing: 0 Negative Vocalization: 1 Facial Expression: 1 Body Language: 0 Consolability: 0 PAINAD Score: 0 Pain Location: generalized with movement Pain Descriptors / Indicators: Discomfort; Grimacing Pain Intervention(s): Limited activity within patient's tolerance; Repositioned End of Session: Start Time:SLP Start Time (ACUTE ONLY): 1014 Stop Time: SLP Stop Time (ACUTE ONLY): 1050 Time Calculation:SLP Time Calculation (min) (ACUTE ONLY): 36 min Charges: SLP Evaluations $ SLP Speech Visit: 1 Visit SLP Evaluations $BSS Swallow: 1 Procedure $MBS Swallow: 1 Procedure SLP visit diagnosis: SLP  Visit Diagnosis: Dysphagia, oropharyngeal phase (R13.12) Past Medical History: Past Medical History: Diagnosis Date  Alzheimer's dementia (HCC)   Anxiety   Atrial fibrillation (HCC)   Permanent  Coronary artery disease   Mild nonobstructive 1/12  DM (diabetes mellitus) (HCC)   GERD (gastroesophageal reflux disease)   Gout   Hearing disorder, cochlear   Hiatal hernia   HLD (hyperlipidemia)   HTN (hypertension)   S/P AV nodal ablation   s/p SJM PPM; gen change 02-01-13 by Dr Kelsie  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  Warfarin anticoagulation  Past Surgical History: Past Surgical History: Procedure Laterality Date  CATARACT EXTRACTION Right   COCHLEAR IMPLANT Right   HEMORRHOID BANDING    KNEE ARTHROSCOPY Right 1992  Dr Brenna   PACEMAKER GENERATOR CHANGE Bilateral 02/01/2013  Procedure: PACEMAKER GENERATOR CHANGE;  Surgeon: Lynwood JONETTA Rakers, MD;  Location: [facility] CATH LAB;  Service: Cardiovascular;  Laterality: Bilateral;  PACEMAKER PLACEMENT  2002; 2014  SJM implanted by Dr Waddell with AV nodal ablation performed; gen change to [employer] SR RF by Dr Rakers 02-01-13  RETINAL LASER PROCEDURE Left   SLT LASER APPLICATION Left 12/14/2015  Procedure: SLT LASER APPLICATION;  Surgeon: Dow JULIANNA Burke, MD;  Location: [facility];  Service: Ophthalmology;  Laterality: Left; Pat Adams,M.S.,CCC-SLP 04/02/2024, 11:23 AM  ECHOCARDIOGRAM COMPLETE Result Date: 04/01/2024    ECHOCARDIOGRAM REPORT   Patient Name:   Jay Campbell Date of Exam: 04/01/2024 Medical Rec #:  [id number]        Height:       65.0 in Accession #:    [id number]       Weight:       175.7 lb Date of Birth:  Jul 25, 1945        BSA:          1.872 m Patient Age:    78 years         BP:           158/69 mmHg Patient Gender: M                HR:           63 bpm. Exam Location:  [facility] Procedure: 2D Echo, Cardiac Doppler and Color Doppler (Both Spectral and Color            Flow Doppler were utilized during procedure). Indications:    CHF-Acute Diastolic I50.31   History:        Patient has prior history of Echocardiogram examinations, most                 recent 05/09/2019. CAD, Pacemaker, Arrythmias:Atrial Fibrillation                 and Complete heart block (HCC); Risk Factors:Hypertension,                 Diabetes, Dyslipidemia and Non-Smoker. Alzheimer's dementia                 (HCC) (From Hx).  Sonographer:    Aida Pizza RCS Referring Phys: [id number] TERRY LOISE HURST  Sonographer Comments: Image acquisition challenging due to patient behavioral factors. IMPRESSIONS  1. Left ventricular ejection fraction, by estimation, is 60 to 65%. The left ventricle has normal function. The left ventricle has no regional wall motion abnormalities. There is mild left ventricular hypertrophy. Left ventricular diastolic parameters are indeterminate.  2. Right ventricular systolic function is normal. The right ventricular size is mildly enlarged. There is normal pulmonary artery systolic pressure.  3. Left atrial size was severely dilated.  4. Right atrial size was severely dilated.  5. The mitral valve is abnormal. Mild to moderate mitral valve regurgitation. No evidence of mitral stenosis. Severe mitral annular calcification.  6. The aortic valve is tricuspid. There is mild calcification of the aortic valve. There is mild thickening of the aortic valve. Aortic valve regurgitation is not visualized. No aortic stenosis is present. Aortic valve mean gradient measures 6.0 mmHg.  7. Aortic dilatation noted. There  is borderline dilatation of the aortic root, measuring 39 mm.  8. The inferior vena cava is normal in size with <50% respiratory variability, suggesting right atrial pressure of 8 mmHg. Comparison(s): No significant change from prior study. FINDINGS  Left Ventricle: Left ventricular ejection fraction, by estimation, is 60 to 65%. The left ventricle has normal function. The left ventricle has no regional wall motion abnormalities. Strain was performed and the global longitudinal  strain is indeterminate. The left ventricular internal cavity size was normal in size. There is mild left ventricular hypertrophy. Left ventricular diastolic parameters are indeterminate. Right Ventricle: The right ventricular size is mildly enlarged. No increase in right ventricular wall thickness. Right ventricular systolic function is normal. There is normal pulmonary artery systolic pressure. The tricuspid regurgitant velocity is 2.34  m/s, and with an assumed right atrial pressure of 8 mmHg, the estimated right ventricular systolic pressure is 29.9 mmHg. Left Atrium: Left atrial size was severely dilated. Right Atrium: Right atrial size was severely dilated. Pericardium: There is no evidence of pericardial effusion. Mitral Valve: The mitral valve is abnormal. Severe mitral annular calcification. Mild to moderate mitral valve regurgitation. No evidence of mitral valve stenosis. Tricuspid Valve: The tricuspid valve is normal in structure. Tricuspid valve regurgitation is mild . No evidence of tricuspid stenosis. Aortic Valve: The aortic valve is tricuspid. There is mild calcification of the aortic valve. There is mild thickening of the aortic valve. Aortic valve regurgitation is not visualized. No aortic stenosis is present. Aortic valve mean gradient measures 6.0 mmHg. Aortic valve peak gradient measures 11.3 mmHg. Aortic valve area, by VTI measures 1.90 cm. Pulmonic Valve: The pulmonic valve was normal in structure. Pulmonic valve regurgitation is trivial. No evidence of pulmonic stenosis. Aorta: Aortic dilatation noted. There is borderline dilatation of the aortic root, measuring 39 mm. Venous: The inferior vena cava is normal in size with less than 50% respiratory variability, suggesting right atrial pressure of 8 mmHg. IAS/Shunts: No atrial level shunt detected by color flow Doppler. Additional Comments: 3D was performed not requiring image post processing on an independent workstation and was  indeterminate.  LEFT VENTRICLE PLAX 2D LVIDd:         4.80 cm LVIDs:         3.00 cm LV PW:         1.20 cm LV IVS:        1.20 cm LVOT diam:     2.10 cm LV SV:         56 LV SV Index:   30 LVOT Area:     3.46 cm  RIGHT VENTRICLE RV S prime:     14.30 cm/s TAPSE (M-mode): 2.7 cm LEFT ATRIUM              Index        RIGHT ATRIUM           Index LA diam:        5.90 cm  3.15 cm/m   RA Area:     38.35 cm LA Vol (A2C):   138.5 ml 73.98 ml/m  RA Volume:   150.00 ml 80.12 ml/m LA Vol (A4C):   182.0 ml 97.21 ml/m LA Biplane Vol: 165.0 ml 88.13 ml/m  AORTIC VALVE AV Area (Vmax):    1.64 cm AV Area (Vmean):   1.83 cm AV Area (VTI):     1.90 cm AV Vmax:           168.00 cm/s AV Vmean:  106.000 cm/s AV VTI:            0.294 m AV Peak Grad:      11.3 mmHg AV Mean Grad:      6.0 mmHg LVOT Vmax:         79.40 cm/s LVOT Vmean:        56.100 cm/s LVOT VTI:          0.161 m LVOT/AV VTI ratio: 0.55  AORTA Ao Root diam: 3.90 cm MITRAL VALVE                TRICUSPID VALVE MV Area (PHT): 3.27 cm     TR Peak grad:   21.9 mmHg MV Decel Time: 232 msec     TR Vmax:        234.00 cm/s MR Peak grad: 119.7 mmHg MR Mean grad: 75.0 mmHg     SHUNTS MR Vmax:      547.00 cm/s   Systemic VTI:  0.16 m MR Vmean:     401.0 cm/s    Systemic Diam: 2.10 cm MV E velocity: 107.00 cm/s MV A velocity: 33.30 cm/s MV E/A ratio:  3.21 Vishnu Priya Mallipeddi Electronically signed by Diannah Late Mallipeddi Signature Date/Time: 04/01/2024/2:10:59 PM    Final    CT CHEST ABDOMEN PELVIS WO CONTRAST Result Date: 04/01/2024 EXAM: CT CHEST, ABDOMEN AND PELVIS WITHOUT CONTRAST 04/01/2024 01:25:10 AM TECHNIQUE: CT of the chest, abdomen and pelvis was performed without the administration of intravenous contrast. Multiplanar reformatted images are provided for review. Automated exposure control, iterative reconstruction, and/or weight based adjustment of the mA/kV was utilized to reduce the radiation dose to as low as reasonably achievable.  COMPARISON: 02/07/2023. CLINICAL HISTORY: Sepsis. FINDINGS: CHEST: MEDIASTINUM AND LYMPH NODES: Cardiomegaly. Pacer wires in the right heart. Coronary artery aortic atherosclerosis. The central airways are clear. No mediastinal, hilar or axillary lymphadenopathy. LUNGS AND PLEURA: Mild to moderate bilateral pleural effusions, right greater than left. Consolidation with air bronchograms in the left lower lobe and posterior lingula concerning for pneumonia. Compressive atelectasis or pneumonia in the right lower lobe. Patchy ground glass airspace opacities also likely related to pneumonia. No pneumothorax. ABDOMEN AND PELVIS: LIVER: Mild nodular contours of the liver suggest cirrhosis. GALLBLADDER AND BILE DUCTS: High-density material within the gallbladder could reflect small stones or contrast material. No biliary ductal dilatation. SPLEEN: The spleen is mildly enlarged at 13 cm craniocaudal length. PANCREAS: No acute abnormality. ADRENAL GLANDS: No acute abnormality. KIDNEYS, URETERS AND BLADDER: 1.6 cm stone in the upper pole of the right kidney. No hydronephrosis. No perinephric or periureteral stranding. Urinary bladder is unremarkable. GI AND BOWEL: The stomach is decompressed but appears to be thick-walled. This could be related to portal gastropathy or gastritis. Sigmoid diverticulosis. No active diverticulitis. Moderate stool burden throughout the colon. There is no bowel obstruction. REPRODUCTIVE ORGANS: Prostate enlargement. PERITONEUM AND RETROPERITONEUM: Moderate free fluid in the abdomen and pelvis. No free air. VASCULATURE: Aorta is normal in caliber. Aortoiliac catheter sclerosis. ABDOMINAL AND PELVIS LYMPH NODES: No lymphadenopathy. BONES AND SOFT TISSUES: Stable severe compression fracture at L1. Diffuse osteopenia. No focal soft tissue abnormality. IMPRESSION: 1. Left lower lobe and lingular consolidation with patchy bilateral ground-glass opacities, most consistent with multifocal pneumonia. 2.  Compressive atelectasis versus additional pneumonia in the right lower lobe. 3. Mild to moderate bilateral pleural effusions, right greater than left. 4. Cirrhotic liver morphology with moderate abdominopelvic ascites and splenomegaly . 5. Gastric wall thickening may be related to portal gastropathy or  gastritis . 6. Right upper pole nephrolithiasis . 7. Coronary artery disease, aortic atherosclerosis. 8. Sigmoid diverticulosis. Moderate stool burden in the colon. Electronically signed by: Franky Crease MD 04/01/2024 01:36 AM EST RP Workstation: [id number]   DG Chest Port 1 View Result Date: 04/01/2024 EXAM: 1 VIEW(S) XRAY OF THE CHEST 04/01/2024 01:00:00 AM COMPARISON: Comparison from 03/24/2024. CLINICAL HISTORY: Increased lethargy and shortness of breath. FINDINGS: LUNGS AND PLEURA: Increasing left basilar infiltrate. Small left pleural effusion. No pneumothorax. HEART AND MEDIASTINUM: The cardiac shadow is prominent. The pacemaker is again seen and stable. BONES AND SOFT TISSUES: No bony abnormality is noted. IMPRESSION: 1. Increasing left basilar infiltrate and small effusion. 2. Prominent cardiac shadow with stable pacemaker. Electronically signed by: Oneil Devonshire MD 04/01/2024 01:04 AM EST RP Workstation: [id number]   DG Chest Port 1 View Result Date: 03/24/2024 CLINICAL DATA:  Respiratory distress. EXAM: PORTABLE CHEST 1 VIEW COMPARISON:  03/03/2024 FINDINGS: The cardio pericardial silhouette is enlarged. Retrocardiac atelectasis or infiltrate noted. Right lung clear. Single lead left-sided permanent pacemaker again noted. Telemetry leads overlie the chest. IMPRESSION: Retrocardiac atelectasis or infiltrate. Electronically Signed   By: Camellia Candle M.D.   On: 03/24/2024 07:22    Eric Nunnery, MD [facility]  If 7PM-7AM, please contact night-coverage [email or web address] Password [email or web address] 04/14/2024, 7:48 AM   LOS: 11 days   "

## 2024-04-14 NOTE — Plan of Care (Signed)

## 2024-04-14 NOTE — Plan of Care (Signed)
  Problem: Education: Goal: Knowledge of General Education information will improve Description: Including pain rating scale, medication(s)/side effects and non-pharmacologic comfort measures Outcome: Not Applicable   Problem: Health Behavior/Discharge Planning: Goal: Ability to manage health-related needs will improve Outcome: Not Applicable   Problem: Clinical Measurements: Goal: Ability to maintain clinical measurements within normal limits will improve Outcome: Not Applicable Goal: Will remain free from infection Outcome: Not Applicable Goal: Diagnostic test results will improve Outcome: Not Applicable Goal: Respiratory complications will improve Outcome: Not Applicable Goal: Cardiovascular complication will be avoided Outcome: Not Applicable   Problem: Activity: Goal: Risk for activity intolerance will decrease Outcome: Not Applicable   Problem: Nutrition: Goal: Adequate nutrition will be maintained Outcome: Not Applicable   Problem: Coping: Goal: Level of anxiety will decrease Outcome: Not Applicable   Problem: Elimination: Goal: Will not experience complications related to bowel motility Outcome: Not Applicable Goal: Will not experience complications related to urinary retention Outcome: Not Applicable   Problem: Pain Managment: Goal: General experience of comfort will improve and/or be controlled Outcome: Not Applicable   Problem: Safety: Goal: Ability to remain free from injury will improve Outcome: Not Applicable   Problem: Skin Integrity: Goal: Risk for impaired skin integrity will decrease Outcome: Not Applicable   Problem: Education: Goal: Knowledge of the prescribed therapeutic regimen will improve Outcome: Not Applicable   Problem: Coping: Goal: Ability to identify and develop effective coping behavior will improve Outcome: Not Applicable   Problem: Clinical Measurements: Goal: Quality of life will improve Outcome: Not Applicable    Problem: Respiratory: Goal: Verbalizations of increased ease of respirations will increase Outcome: Not Applicable   Problem: Role Relationship: Goal: Family's ability to cope with current situation will improve Outcome: Not Applicable Goal: Ability to verbalize concerns, feelings, and thoughts to partner or family member will improve Outcome: Not Applicable   Problem: Pain Management: Goal: Satisfaction with pain management regimen will improve Outcome: Not Applicable

## 2024-04-15 NOTE — Plan of Care (Signed)
   Problem: Coping: Goal: Level of anxiety will decrease Outcome: Progressing   Problem: Coping: Goal: Level of anxiety will decrease Outcome: Progressing

## 2024-04-15 NOTE — TOC Progression Note (Signed)
 Transition of Care [facility]) - Progression Note    Patient Details  Name: Jay Campbell MRN: [id number] Date of Birth: May 13, 1945  [facility] CM/SW Contact  [name], [place] Phone Number: 04/15/2024, 11:42 AM  Clinical Narrative: Per Lucie at [facility], no beds available at [facility] today. MD updated.                        Expected Discharge Plan and Services                                               Social Drivers of Health (SDOH) Interventions SDOH Screenings   Food Insecurity: Patient Unable To Answer (04/03/2024)  Housing: Unknown (04/03/2024)  Transportation Needs: Patient Unable To Answer (04/03/2024)  Utilities: Patient Unable To Answer (04/03/2024)  Financial Resource Strain: Low Risk (07/01/2023)   Received from [facility]  Social Connections: Patient Unable To Answer (04/03/2024)  Tobacco Use: Low Risk (04/04/2024)    Readmission Risk Interventions    04/01/2024   11:17 AM 03/31/2024    3:53 PM 03/25/2024    9:58 AM  Readmission Risk Prevention Plan  Transportation Screening Complete Complete Complete  Medication Review Oceanographer) Complete Complete Complete  PCP or Specialist appointment within 3-5 days of discharge Not Complete    HRI or Home Care Consult Complete  Complete  SW Recovery Care/Counseling Consult Complete Complete Complete  Palliative Care Screening Not Complete  Not Applicable  Skilled Nursing Facility Complete Complete Complete

## 2024-04-15 NOTE — Plan of Care (Signed)
   Problem: Coping: Goal: Level of anxiety will decrease Outcome: Progressing

## 2024-04-16 MED ORDER — NYSTATIN 100000 UNIT/GM EX POWD
Freq: Three times a day (TID) | CUTANEOUS | Status: DC
  Filled 2024-04-16 (×2): qty 15

## 2024-04-16 NOTE — TOC Progression Note (Signed)
 Transition of Care [facility]) - Progression Note    Patient Details  Name: [name] MRN: [id number] Date of Birth: Feb 23, 1946  [facility] of [facility]) CM/SW Contact  [name], [place] Phone Number: 04/16/2024, 9:53 AM  Clinical Narrative:  Per hospice, no beds at [facility] at this time. TOC will follow.                        Expected Discharge Plan and Services                                               Social Drivers of Health (SDOH) Interventions SDOH Screenings   Food Insecurity: Patient Unable To Answer (04/03/2024)  Housing: Unknown (04/03/2024)  Transportation Needs: Patient Unable To Answer (04/03/2024)  Utilities: Patient Unable To Answer (04/03/2024)  Financial Resource Strain: Low Risk (07/01/2023)   Received from [facility]  Social Connections: Patient Unable To Answer (04/03/2024)  Tobacco Use: Low Risk (04/04/2024)    Readmission Risk Interventions    04/16/2024    7:39 AM 04/01/2024   11:17 AM 03/31/2024    3:53 PM  Readmission Risk Prevention Plan  Transportation Screening Complete Complete Complete  HRI or Home Care Consult Complete    Social Work Consult for Recovery Care Planning/Counseling Complete    Palliative Care Screening Complete    Medication Review Oceanographer) Complete Complete Complete  PCP or Specialist appointment within 3-5 days of discharge  Not Complete   HRI or Home Care Consult  Complete   SW Recovery Care/Counseling Consult  Complete Complete  Palliative Care Screening  Not Complete   Skilled Nursing Facility  Complete Complete

## 2024-04-16 NOTE — Plan of Care (Signed)
   Problem: Coping: Goal: Level of anxiety will decrease Outcome: Progressing

## 2024-04-17 NOTE — TOC Progression Note (Signed)
 Transition of Care [facility]) - Progression Note    Patient Details  Name: Jay Campbell MRN: [id number] Date of Birth: 25-Jul-1945  Transition of Care [facility]) CM/SW Contact  [name], [place] Phone Number: 04/17/2024, 8:20 AM  Clinical Narrative: Per Dorina at [facility], no beds available at [facility] at this time. TOC will follow.                         Expected Discharge Plan and Services                                               Social Drivers of Health (SDOH) Interventions SDOH Screenings   Food Insecurity: Patient Unable To Answer (04/03/2024)  Housing: Unknown (04/03/2024)  Transportation Needs: Patient Unable To Answer (04/03/2024)  Utilities: Patient Unable To Answer (04/03/2024)  Financial Resource Strain: Low Risk (07/01/2023)   Received from [facility]  Social Connections: Patient Unable To Answer (04/03/2024)  Tobacco Use: Low Risk (04/04/2024)    Readmission Risk Interventions    04/16/2024    7:39 AM 04/01/2024   11:17 AM 03/31/2024    3:53 PM  Readmission Risk Prevention Plan  Transportation Screening Complete Complete Complete  HRI or Home Care Consult Complete    Social Work Consult for Recovery Care Planning/Counseling Complete    Palliative Care Screening Complete    Medication Review Oceanographer) Complete Complete Complete  PCP or Specialist appointment within 3-5 days of discharge  Not Complete   HRI or Home Care Consult  Complete   SW Recovery Care/Counseling Consult  Complete Complete  Palliative Care Screening  Not Complete   Skilled Nursing Facility  Complete Complete

## 2024-04-17 NOTE — Progress Notes (Signed)
 "          PROGRESS NOTE  Jay Campbell ISHIDA FMW:[id number] DOB: 1945/03/26 DOA: 04/03/2024 PCP: [name], MD  Brief History:  79 y.o. male with medical history significant for Alzheimer's dementia, history of agitation due to dementia, coronary artery disease, permanent atrial fibrillation previously on Eliquis , complete heart block status post pacemaker placement, chronic thrombocytopenia, type 2 diabetes, hypertension, hyperlipidemia, GERD, BPH, who presents to the ER via EMS from SNF due to shortness of breath and generalized weakness.  The patient was discharged yesterday after being admitted for sepsis secondary to pneumonia and acute hypoxic respiratory failure requiring 2 L nasal cannula.  Staff at SNF reported the patient was more short of breath and lethargic than baseline today.  EMS was activated.  He received albuterol  nebs en route via EMS.   In the ER, hypoxic with O2 saturation of 87% on 2 L nasal cannula.  The patient is alert and confused in the setting of dementia.  Frail-appearing with pressure wounds in heels.  He is minimally verbal and unable to provide a history.     A chest x-ray revealed increasing left basilar infiltrates and small effusion.  Prominent cardiac shadow with stable pacemaker.  Lactic acid 3.9.  There was concern for sepsis.  Subsequently, his CT chest abdomen pelvis without contrast revealed left lower lobe and lingular consolidation with patchy bilateral ground glass opacities more consistent with multifocal pneumonia.  Compressive atelectasis versus additional pneumonia in the right lower lobe.  Mild to moderate bilateral pleural effusions, right greater than left.  Cirrhotic liver morphology with moderate abdominal pelvic ascites and splenomegaly.  Gastric wall thickening may be related to portal gastropathy or gastritis.  Right upper lobe nephrolithiasis.  Coronary artery disease, aortic atherosclerosis.  Sigmoid diverticulosis.  Moderate stool burden in the  colon.   The patient received 1 dose of cefepime  and azithromycin  in the ER.  TR[facility], hospitalist service, was asked to admit for worsening pneumonia.  Subsequently, the patient's antibiotics were discontinued as the patient received a full course of antibiotics during his last hospitalization from 03/24/2024 to 03/31/2024.  He remained afebrile and hemodynamically stable. Unfortunately, the patient's mental status did not improve much.  He was noted to be dehydrated with a free water  deficit.  Sodium went up to 155.  He was started on IV hypotonic fluid.  Although his sodium did gradually improve, the patient remained somnolent with minimal to no p.o. intake.  He continued to have dysphagia.  The patient was seen by speech therapy.  MBS was performed and did not reveal aspiration; however, patient was felt to be at continued risk for aspiration secondary to his impaired cognition and deconditioning.  A dysphagia 1 diet was ultimately recommended.  Palliative medicine was consulted.  After meetings with the patient's family, the patient's son was ultimately agreeable to transition to full comfort measures after the patient was accepted to residential hospice.   A bed was not available at [facility].  The patient was admitted to the hospital for hospital hospice care until bed is available at [facility] house.  Palliative medicine continue to follow.  They spoke with patient's son and verified desire to transition to full comfort measures.   Patient is stable and at the moment continue waiting for bed availability at the [facility] house to continue symptomatic management and hospice care.  Will continue comfort care.  Assessment/Plan: Adult Failure to Thrive -- unfortunately, despite maximal medical interventions and recent prolonged hospitalization of 7  days he has bounced back into the hospital within 24 hours. -- Patient has significant dysphagia and unable to eat/drink enough to maintain body requirements for  fluids and nutrition -- Patient was initially started on IV antibiotics and IV fluids. --He did not improve clinically.  He continued to be somnolent with very poor to no oral intake -- continue DNR/DNI orders present on admission -Ultimately, patient's family agreed to transition to full comfort measures after the patient was accepted to residential hospice. - Patient now has been transitioned to general inpatient hospice and for comfort measures have been instituted. - added oral morphine  solution   Multifocal pneumonia, seen on CT scan -- Pneumonia was fully treated during previous hospitalization -- DC antibiotics -- Patient remained afebrile hemodynamically stable - Patient now has been transitioned to general inpatient hospice and for comfort measures have been instituted.   Severe Dysphagia -- SLP evaluation requested -- MBS done--no definitive aspiration -- Patient continued to remain high risk for aspiration -Dysphagia 1 diet ordered>> pleasure feeding -Oral intake remains very poor - Patient now has been transitioned to general inpatient hospice and for comfort measures have been instituted.   Bilateral mild to moderate pleural effusions, seen on CT scan -- Closely monitor volume status -- Secondary to hepatic hydrothorax -Cirrhotic liver noted on CT abdomen and pelvis   Chronic hypoxic respiratory failure secondary to the above -- Recently started on 2 L O2 supplementation during previous admission for pneumonia. -- Unfortunately supplemental oxygen  wasn't provided at facility -Stable on 2 to 3 L presently. - Patient now has been transitioned to general inpatient hospice and for comfort measures have been instituted.   Lactic acidosis -- Secondary to Hypoxia, hypoperfusion, volume depletion -- Lactic acid is trending down  -- Follow peripheral blood cultures x 2. -- IV fluid hydration  -- Sepsis has been ruled out   Elevated troponin, suspect demand ischemia  secondary to the above History of coronary artery disease Aortic atherosclerosis High-sensitivity troponin 23, 26. No evidence of acute ischemia on twelve-lead EKG No chest pain reported. Monitor on telemetry.   Chronic HFpEF Last 2D echo done on 05/09/2019 revealed LVEF 60 to 65% Presented with dyspnea, cardiomegaly, and bilateral pleural effusions -04/01/2024 echo EF 60 to 65%, no WMA, normal RVF - Patient now has been transitioned to general inpatient hospice and for comfort measures have been instituted.   Hypernatremia Speech therapy is consulted to rule out dysphagia Hypotonic IV fluid to correct free water  deficit --Patient now transition to full comfort measures   Hypokalemia -initially repleted -now on comfort measures   Gastric wall thickening, may be related to portal gastropathy or gastritis, seen on CT scan. IV PPI Protonix  40 mg daily initially No reported abdominal pain or GI bleed Avoid NSAIDs Patient may transition to full comfort measures   Generalized weakness He is chronically debilitated and this is unchanged Palliative consultation requested    Type 2 diabetes with hyperglycemia Last hemoglobin A1c 7.3 on 03/24/2024   Hypertension Resume home Lopressor  Closely monitor vital signs.   Alzheimer's dementia with history of agitation Resume home regimen. Aricept  held due to transient bradycardia. -now on comfort measures care   Permanent atrial fibrillation, no longer on Eliquis  due to chronic thrombocytopenia Currently rate controlled --- Patient now has been transitioned to general inpatient hospice and for comfort measures have been instituted.     Family Communication:   no family members present at bedside   Consultants:  palliative   Code Status:  FULL COMFORT   DVT Prophylaxis:  FULL COMFORT     Procedures: As Listed in Progress Note Above   Antibiotics: none  Subjective: In no acute distress.  No nausea, no vomiting, no  fever.  Objective: Vitals:   04/15/24 1453 04/15/24 1759 04/15/24 2016 04/16/24 1939  BP: (!) 152/116 (!) 160/60 135/60 (!) 137/54  Pulse: 66 62 68 63  Resp:   18 16  Temp: 98.2 F (36.8 C)  98.3 F (36.8 C) 99.5 F (37.5 C)  TempSrc: Axillary  Oral Oral  SpO2: 92%  93% 91%   No intake or output data in the 24 hours ending 04/17/24 1413   Weight change:   Exam: General exam: Appears comfortable; in no major distress. Regular heart rate Mild wheezing bilaterally Data Reviewed: I have personally reviewed following labs and imaging studies  HbA1C: No results for input(s): HGBA1C in the last 72 hours.  Urine analysis:    Component Value Date/Time   COLORURINE COLORLESS (A) 04/01/2024 0658   APPEARANCEUR CLEAR 04/01/2024 0658   LABSPEC 1.008 04/01/2024 0658   PHURINE 5.0 04/01/2024 0658   GLUCOSEU NEGATIVE 04/01/2024 0658   HGBUR MODERATE (A) 04/01/2024 0658   BILIRUBINUR NEGATIVE 04/01/2024 0658   KETONESUR NEGATIVE 04/01/2024 0658   PROTEINUR NEGATIVE 04/01/2024 0658   NITRITE NEGATIVE 04/01/2024 0658   LEUKOCYTESUR NEGATIVE 04/01/2024 0658   Sepsis Labs:  No results found for this or any previous visit (from the past 240 hours).    Scheduled Meds:  antiseptic oral rinse  15 mL Mouth Rinse TID   nystatin    Topical TID   Continuous Infusions:  Procedures/Studies: DG Swallowing Func-Speech Pathology Result Date: 04/02/2024 Table formatting from the original result was not included. Modified Barium Swallow Study Patient Details Name: SHAYAN BRAMHALL MRN: [id number] Date of Birth: 01-Jul-1945 Today's Date: 04/02/2024 HPI/PMH: HPI: Jay Campbell is a 79 y.o. male with medical history significant for Alzheimer's dementia, history of agitation due to dementia, coronary artery disease, permanent atrial fibrillation previously on Eliquis , complete heart block status post pacemaker placement, chronic thrombocytopenia, type 2 diabetes, hypertension, hyperlipidemia, GERD,  BPH, who presents to the ER via EMS from SNF due to shortness of breath and generalized weakness. The patient was discharged yesterday after being admitted for sepsis secondary to pneumonia and acute hypoxic respiratory failure requiring 2 L nasal cannula. Staff at SNF reported the patient was more short of breath and lethargic than baseline today. EMS was activated. He received albuterol  nebs en route via EMS. In the ER, hypoxic with O2 saturation of 87% on 2 L nasal cannula. The patient is alert and confused in the setting of dementia. Frail-appearing with pressure wounds in heels. He is minimally verbal and unable to provide a history. A chest x-ray revealed increasing left basilar infiltrates and small effusion. Prominent cardiac shadow with stable pacemaker. Lactic acid 3.9. There was concern for sepsis. Subsequently, his CT chest abdomen pelvis without contrast revealed left lower lobe and lingular consolidation with patchy bilateral ground glass opacities more consistent with multifocal pneumonia. Compressive atelectasis versus additional pneumonia in the right lower lobe. Mild to moderate bilateral pleural effusions, right greater than left. Pt was seen by SLP service on Thursday and was discharged over the weekend. BSE completed and pt exhibiting overt s/s of aspiration and placed NPO. MBS ordered to determine safest diet and assess swallow function objectively. Clinical Impression: Clinical Impression: Pt presents with mild oropharyngeal dysphagia compounded by cognitive-based dysphagia c/b escape to lateral buccal  cavity/floor of mouth and slow, prolonged mastication with solids.  Delayed initiation of tongue motion (oral holding) and delayed initiation of the swallow (typical for age) at the level of the valleculae throughout study.  Diminished pharyngeal stripping wave and partial epiglottic inflection observed potentially d/t bulging PPW/?cervical osteophytes.  Decreased tongue base retraction  resulting in diffuse residue within the vallecular space, pyriform sinuses and posterior pharyngeal wall intermittently during study.  It should be noted pt exhibited xerostomia prior to any po intake d/t NPO status which may have contributed to amount of residue within pharynx and other structures.  As the study progressed and compensatory strategies instilled including liquid wash, alternating solids/liquids and multiple effortful swallows, residue cleared to mild-insignificant amount within affected structures.  Partial distension/obstruction of flow observed in UES d/t prominent CP bar presence.  NO aspiration and/or penetration observed throughout study, but d/t pt's impaired cognition and deconditioning/dysphagia, he is at risk for aspiration if swallow strategies/A with meals/full supervision is not followed.  Recommend initiating a Dysphagia 1(puree)/thin liquid diet via tsp amounts with FULL supervision/A with meals.  ST will f/u for dysphagia tx/education during remainder of acute stay.  ST should f/u at next venue of care. Factors that may increase risk of adverse event in presence of aspiration Noe & Lianne 2021): Factors that may increase risk of adverse event in presence of aspiration Noe & Lianne 2021): Poor general health and/or compromised immunity; Reduced cognitive function; Frail or deconditioned; Weak cough; Aspiration of thick, dense, and/or acidic materials Recommendations/Plan: Swallowing Evaluation Recommendations Swallowing Evaluation Recommendations Recommendations: PO diet PO Diet Recommendation: Dysphagia 1 (Pureed); Thin liquids (Level 0) Liquid Administration via: Spoon Medication Administration: Crushed with puree Supervision: Full assist for feeding; Full supervision/cueing for swallowing strategies Swallowing strategies  : Slow rate; Small bites/sips; Minimize environmental distractions; effortful swallow; Multiple dry swallows after each bite/sip; Follow solids with  liquids Postural changes: Position pt fully upright for meals; Stay upright 30-60 min after meals Oral care recommendations: Oral care BID (2x/day); Staff/trained caregiver to provide oral care Treatment Plan Treatment Plan Treatment recommendations: Therapy as outlined in treatment plan below Follow-up recommendations: Skilled nursing-short term rehab (<3 hours/day) Functional status assessment: Patient has had a recent decline in their functional status and demonstrates the ability to make significant improvements in function in a reasonable and predictable amount of time. Treatment frequency: Min 2x/week Treatment duration: 1 week Interventions: Aspiration precaution training; Compensatory techniques; Patient/family education; Trials of upgraded texture/liquids; Diet toleration management by SLP Recommendations Recommendations for follow up therapy are one component of a multi-disciplinary discharge planning process, led by the attending physician.  Recommendations may be updated based on patient status, additional functional criteria and insurance authorization. Assessment: Orofacial Exam: Orofacial Exam Oral Cavity: Oral Hygiene: Xerostomia Oral Cavity - Dentition: Poor condition; Missing dentition Orofacial Anatomy: WFL Oral Motor/Sensory Function: Generalized oral weakness Anatomy: Anatomy: WFL Boluses Administered: Boluses Administered Boluses Administered: Thin liquids (Level 0); Mildly thick liquids (Level 2, nectar thick); Moderately thick liquids (Level 3, honey thick); Puree; Solid  Oral Impairment Domain: Oral Impairment Domain Lip Closure: No labial escape Tongue control during bolus hold: Escape to lateral buccal cavity/floor of mouth Bolus preparation/mastication: Slow prolonged chewing/mashing with complete recollection Bolus transport/lingual motion: Delayed initiation of tongue motion (oral holding) Oral residue: Trace residue lining oral structures Location of oral residue : Tongue Initiation of  pharyngeal swallow : Valleculae  Pharyngeal Impairment Domain: Pharyngeal Impairment Domain Soft palate elevation: No bolus between soft palate (SP)/pharyngeal wall (PW) Laryngeal elevation:  Complete superior movement of thyroid  cartilage with complete approximation of arytenoids to epiglottic petiole Anterior hyoid excursion: Complete anterior movement Epiglottic movement: Partial inversion Laryngeal vestibule closure: Complete, no air/contrast in laryngeal vestibule Pharyngeal stripping wave : Present - diminished Pharyngeal contraction (A/P view only): N/A Pharyngoesophageal segment opening: Partial distention/partial duration, partial obstruction of flow Tongue base retraction: Narrow column of contrast or air between tongue base and PPW Pharyngeal residue: Collection of residue within or on pharyngeal structures Location of pharyngeal residue: Diffuse (>3 areas)  Esophageal Impairment Domain: Esophageal Impairment Domain Esophageal clearance upright position: Complete clearance, esophageal coating Pill: Pill Consistency administered: -- (n/a) Penetration/Aspiration Scale Score: Penetration/Aspiration Scale Score 1.  Material does not enter airway: Thin liquids (Level 0); Mildly thick liquids (Level 2, nectar thick); Moderately thick liquids (Level 3, honey thick); Puree; Solid Compensatory Strategies: Compensatory Strategies Compensatory strategies: Yes Effortful swallow: Effective Effective Effortful Swallow: Thin liquid (Level 0); Mildly thick liquid (Level 2, nectar thick); Puree Multiple swallows: Effective Effective Multiple Swallows: Thin liquid (Level 0); Mildly thick liquid (Level 2, nectar thick); Puree Liquid wash: Effective Effective Liquid Wash: Thin liquid (Level 0); Mildly thick liquid (Level 2, nectar thick); Puree; Solid   General Information: Caregiver present: No  Diet Prior to this Study: NPO   Temperature : Normal   Respiratory Status: WFL   Supplemental O2: Nasal cannula (3L)   History of  Recent Intubation: No  Behavior/Cognition: Alert; Cooperative; Distractible; Requires cueing Self-Feeding Abilities: Dependent for feeding Baseline vocal quality/speech: Hypophonia/low volume; Dysphonic Volitional Cough: Able to elicit Volitional Swallow: Unable to elicit Exam Limitations: No limitations Goal Planning: Prognosis for improved oropharyngeal function: Good Barriers to Reach Goals: Cognitive deficits No data recorded Patient/Family Stated Goal: n/a Consulted and agree with results and recommendations: Patient; Pt unable/family or caregiver not available; Nurse; Physician Pain: Pain Assessment Pain Assessment: Faces Faces Pain Scale: 2 Breathing: 0 Negative Vocalization: 1 Facial Expression: 1 Body Language: 0 Consolability: 0 PAINAD Score: 0 Pain Location: generalized with movement Pain Descriptors / Indicators: Discomfort; Grimacing Pain Intervention(s): Limited activity within patient's tolerance; Repositioned End of Session: Start Time:SLP Start Time (ACUTE ONLY): 1014 Stop Time: SLP Stop Time (ACUTE ONLY): 1050 Time Calculation:SLP Time Calculation (min) (ACUTE ONLY): 36 min Charges: SLP Evaluations $ SLP Speech Visit: 1 Visit SLP Evaluations $BSS Swallow: 1 Procedure $MBS Swallow: 1 Procedure SLP visit diagnosis: SLP Visit Diagnosis: Dysphagia, oropharyngeal phase (R13.12) Past Medical History: Past Medical History: Diagnosis Date  Alzheimer's dementia (HCC)   Anxiety   Atrial fibrillation (HCC)   Permanent  Coronary artery disease   Mild nonobstructive 1/12  DM (diabetes mellitus) (HCC)   GERD (gastroesophageal reflux disease)   Gout   Hearing disorder, cochlear   Hiatal hernia   HLD (hyperlipidemia)   HTN (hypertension)   S/P AV nodal ablation   s/p SJM PPM; gen change 02-01-13 by Dr Kelsie  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  Warfarin anticoagulation  Past Surgical History: Past Surgical History: Procedure Laterality Date  CATARACT EXTRACTION Right   COCHLEAR IMPLANT Right   HEMORRHOID  BANDING    KNEE ARTHROSCOPY Right 1992  Dr Brenna   PACEMAKER GENERATOR CHANGE Bilateral 02/01/2013  Procedure: PACEMAKER GENERATOR CHANGE;  Surgeon: Lynwood JONETTA Kelsie, MD;  Location: [facility] CATH [facility];  Service: Cardiovascular;  Laterality: Bilateral;  PACEMAKER PLACEMENT  2002; 2014  SJM implanted by Dr Waddell with AV nodal ablation performed; gen change to [employer] SR RF by Dr Kelsie 02-01-13  RETINAL LASER PROCEDURE Left   SLT LASER APPLICATION  Left 12/14/2015  Procedure: SLT LASER APPLICATION;  Surgeon: Dow JULIANNA Burke, MD;  Location: [facility];  Service: Ophthalmology;  Laterality: Left; Pat Adams,M.S.,CCC-SLP 04/02/2024, 11:23 AM  ECHOCARDIOGRAM COMPLETE Result Date: 04/01/2024    ECHOCARDIOGRAM REPORT   Patient Name:   LEMON STERNBERG Date of Exam: 04/01/2024 Medical Rec #:  [id number]        Height:       65.0 in Accession #:    [id number]       Weight:       175.7 lb Date of Birth:  04/18/1945        BSA:          1.872 m Patient Age:    78 years         BP:           158/69 mmHg Patient Gender: M                HR:           63 bpm. Exam Location:  [facility] Procedure: 2D Echo, Cardiac Doppler and Color Doppler (Both Spectral and Color            Flow Doppler were utilized during procedure). Indications:    CHF-Acute Diastolic I50.31  History:        Patient has prior history of Echocardiogram examinations, most                 recent 05/09/2019. CAD, Pacemaker, Arrythmias:Atrial Fibrillation                 and Complete heart block (HCC); Risk Factors:Hypertension,                 Diabetes, Dyslipidemia and Non-Smoker. Alzheimer's dementia                 (HCC) (From Hx).  Sonographer:    Aida Pizza RCS Referring Phys: [id number] TERRY LOISE HURST  Sonographer Comments: Image acquisition challenging due to patient behavioral factors. IMPRESSIONS  1. Left ventricular ejection fraction, by estimation, is 60 to 65%. The left ventricle has normal function. The left ventricle has no regional wall motion abnormalities. There  is mild left ventricular hypertrophy. Left ventricular diastolic parameters are indeterminate.  2. Right ventricular systolic function is normal. The right ventricular size is mildly enlarged. There is normal pulmonary artery systolic pressure.  3. Left atrial size was severely dilated.  4. Right atrial size was severely dilated.  5. The mitral valve is abnormal. Mild to moderate mitral valve regurgitation. No evidence of mitral stenosis. Severe mitral annular calcification.  6. The aortic valve is tricuspid. There is mild calcification of the aortic valve. There is mild thickening of the aortic valve. Aortic valve regurgitation is not visualized. No aortic stenosis is present. Aortic valve mean gradient measures 6.0 mmHg.  7. Aortic dilatation noted. There is borderline dilatation of the aortic root, measuring 39 mm.  8. The inferior vena cava is normal in size with <50% respiratory variability, suggesting right atrial pressure of 8 mmHg. Comparison(s): No significant change from prior study. FINDINGS  Left Ventricle: Left ventricular ejection fraction, by estimation, is 60 to 65%. The left ventricle has normal function. The left ventricle has no regional wall motion abnormalities. Strain was performed and the global longitudinal strain is indeterminate. The left ventricular internal cavity size was normal in size. There is mild left ventricular hypertrophy. Left ventricular diastolic parameters are indeterminate. Right Ventricle: The right ventricular size is  mildly enlarged. No increase in right ventricular wall thickness. Right ventricular systolic function is normal. There is normal pulmonary artery systolic pressure. The tricuspid regurgitant velocity is 2.34  m/s, and with an assumed right atrial pressure of 8 mmHg, the estimated right ventricular systolic pressure is 29.9 mmHg. Left Atrium: Left atrial size was severely dilated. Right Atrium: Right atrial size was severely dilated. Pericardium: There is no  evidence of pericardial effusion. Mitral Valve: The mitral valve is abnormal. Severe mitral annular calcification. Mild to moderate mitral valve regurgitation. No evidence of mitral valve stenosis. Tricuspid Valve: The tricuspid valve is normal in structure. Tricuspid valve regurgitation is mild . No evidence of tricuspid stenosis. Aortic Valve: The aortic valve is tricuspid. There is mild calcification of the aortic valve. There is mild thickening of the aortic valve. Aortic valve regurgitation is not visualized. No aortic stenosis is present. Aortic valve mean gradient measures 6.0 mmHg. Aortic valve peak gradient measures 11.3 mmHg. Aortic valve area, by VTI measures 1.90 cm. Pulmonic Valve: The pulmonic valve was normal in structure. Pulmonic valve regurgitation is trivial. No evidence of pulmonic stenosis. Aorta: Aortic dilatation noted. There is borderline dilatation of the aortic root, measuring 39 mm. Venous: The inferior vena cava is normal in size with less than 50% respiratory variability, suggesting right atrial pressure of 8 mmHg. IAS/Shunts: No atrial level shunt detected by color flow Doppler. Additional Comments: 3D was performed not requiring image post processing on an independent workstation and was indeterminate.  LEFT VENTRICLE PLAX 2D LVIDd:         4.80 cm LVIDs:         3.00 cm LV PW:         1.20 cm LV IVS:        1.20 cm LVOT diam:     2.10 cm LV SV:         56 LV SV Index:   30 LVOT Area:     3.46 cm  RIGHT VENTRICLE RV S prime:     14.30 cm/s TAPSE (M-mode): 2.7 cm LEFT ATRIUM              Index        RIGHT ATRIUM           Index LA diam:        5.90 cm  3.15 cm/m   RA Area:     38.35 cm LA Vol (A2C):   138.5 ml 73.98 ml/m  RA Volume:   150.00 ml 80.12 ml/m LA Vol (A4C):   182.0 ml 97.21 ml/m LA Biplane Vol: 165.0 ml 88.13 ml/m  AORTIC VALVE AV Area (Vmax):    1.64 cm AV Area (Vmean):   1.83 cm AV Area (VTI):     1.90 cm AV Vmax:           168.00 cm/s AV Vmean:           106.000 cm/s AV VTI:            0.294 m AV Peak Grad:      11.3 mmHg AV Mean Grad:      6.0 mmHg LVOT Vmax:         79.40 cm/s LVOT Vmean:        56.100 cm/s LVOT VTI:          0.161 m LVOT/AV VTI ratio: 0.55  AORTA Ao Root diam: 3.90 cm MITRAL VALVE  TRICUSPID VALVE MV Area (PHT): 3.27 cm     TR Peak grad:   21.9 mmHg MV Decel Time: 232 msec     TR Vmax:        234.00 cm/s MR Peak grad: 119.7 mmHg MR Mean grad: 75.0 mmHg     SHUNTS MR Vmax:      547.00 cm/s   Systemic VTI:  0.16 m MR Vmean:     401.0 cm/s    Systemic Diam: 2.10 cm MV E velocity: 107.00 cm/s MV A velocity: 33.30 cm/s MV E/A ratio:  3.21 Vishnu Priya Mallipeddi Electronically signed by Diannah Late Mallipeddi Signature Date/Time: 04/01/2024/2:10:59 PM    Final    CT CHEST ABDOMEN PELVIS WO CONTRAST Result Date: 04/01/2024 EXAM: CT CHEST, ABDOMEN AND PELVIS WITHOUT CONTRAST 04/01/2024 01:25:10 AM TECHNIQUE: CT of the chest, abdomen and pelvis was performed without the administration of intravenous contrast. Multiplanar reformatted images are provided for review. Automated exposure control, iterative reconstruction, and/or weight based adjustment of the mA/kV was utilized to reduce the radiation dose to as low as reasonably achievable. COMPARISON: 02/07/2023. CLINICAL HISTORY: Sepsis. FINDINGS: CHEST: MEDIASTINUM AND LYMPH NODES: Cardiomegaly. Pacer wires in the right heart. Coronary artery aortic atherosclerosis. The central airways are clear. No mediastinal, hilar or axillary lymphadenopathy. LUNGS AND PLEURA: Mild to moderate bilateral pleural effusions, right greater than left. Consolidation with air bronchograms in the left lower lobe and posterior lingula concerning for pneumonia. Compressive atelectasis or pneumonia in the right lower lobe. Patchy ground glass airspace opacities also likely related to pneumonia. No pneumothorax. ABDOMEN AND PELVIS: LIVER: Mild nodular contours of the liver suggest cirrhosis. GALLBLADDER AND BILE  DUCTS: High-density material within the gallbladder could reflect small stones or contrast material. No biliary ductal dilatation. SPLEEN: The spleen is mildly enlarged at 13 cm craniocaudal length. PANCREAS: No acute abnormality. ADRENAL GLANDS: No acute abnormality. KIDNEYS, URETERS AND BLADDER: 1.6 cm stone in the upper pole of the right kidney. No hydronephrosis. No perinephric or periureteral stranding. Urinary bladder is unremarkable. GI AND BOWEL: The stomach is decompressed but appears to be thick-walled. This could be related to portal gastropathy or gastritis. Sigmoid diverticulosis. No active diverticulitis. Moderate stool burden throughout the colon. There is no bowel obstruction. REPRODUCTIVE ORGANS: Prostate enlargement. PERITONEUM AND RETROPERITONEUM: Moderate free fluid in the abdomen and pelvis. No free air. VASCULATURE: Aorta is normal in caliber. Aortoiliac catheter sclerosis. ABDOMINAL AND PELVIS LYMPH NODES: No lymphadenopathy. BONES AND SOFT TISSUES: Stable severe compression fracture at L1. Diffuse osteopenia. No focal soft tissue abnormality. IMPRESSION: 1. Left lower lobe and lingular consolidation with patchy bilateral ground-glass opacities, most consistent with multifocal pneumonia. 2. Compressive atelectasis versus additional pneumonia in the right lower lobe. 3. Mild to moderate bilateral pleural effusions, right greater than left. 4. Cirrhotic liver morphology with moderate abdominopelvic ascites and splenomegaly . 5. Gastric wall thickening may be related to portal gastropathy or gastritis . 6. Right upper pole nephrolithiasis . 7. Coronary artery disease, aortic atherosclerosis. 8. Sigmoid diverticulosis. Moderate stool burden in the colon. Electronically signed by: Franky Crease MD 04/01/2024 01:36 AM EST RP Workstation: [id number]   DG Chest Port 1 View Result Date: 04/01/2024 EXAM: 1 VIEW(S) XRAY OF THE CHEST 04/01/2024 01:00:00 AM COMPARISON: Comparison from 03/24/2024. CLINICAL  HISTORY: Increased lethargy and shortness of breath. FINDINGS: LUNGS AND PLEURA: Increasing left basilar infiltrate. Small left pleural effusion. No pneumothorax. HEART AND MEDIASTINUM: The cardiac shadow is prominent. The pacemaker is again seen and stable. BONES AND SOFT TISSUES:  No bony abnormality is noted. IMPRESSION: 1. Increasing left basilar infiltrate and small effusion. 2. Prominent cardiac shadow with stable pacemaker. Electronically signed by: Oneil Devonshire MD 04/01/2024 01:04 AM EST RP Workstation: [id number]   DG Chest Port 1 View Result Date: 03/24/2024 CLINICAL DATA:  Respiratory distress. EXAM: PORTABLE CHEST 1 VIEW COMPARISON:  03/03/2024 FINDINGS: The cardio pericardial silhouette is enlarged. Retrocardiac atelectasis or infiltrate noted. Right lung clear. Single lead left-sided permanent pacemaker again noted. Telemetry leads overlie the chest. IMPRESSION: Retrocardiac atelectasis or infiltrate. Electronically Signed   By: Camellia Candle M.D.   On: 03/24/2024 07:22    Derryl Duval, MD [facility]  If 7PM-7AM, please contact night-coverage [email or web address] Password [email or web address] 04/17/2024, 2:13 PM   LOS: 14 days   "

## 2024-04-17 NOTE — Plan of Care (Signed)
   Problem: Coping: Goal: Level of anxiety will decrease Outcome: Progressing

## 2024-04-18 MED ORDER — HALOPERIDOL LACTATE 2 MG/ML PO CONC: 0.5000 mg | ORAL | Status: AC | PRN

## 2024-04-18 MED ORDER — LORAZEPAM 2 MG/ML PO CONC: 1.0000 mg | ORAL | Status: AC | PRN

## 2024-04-18 MED ORDER — GLYCOPYRROLATE 0.2 MG/ML IJ SOLN: 0.2000 mg | INTRAMUSCULAR | Status: AC | PRN

## 2024-04-18 MED ORDER — HALOPERIDOL LACTATE 5 MG/ML IJ SOLN: 0.5000 mg | INTRAMUSCULAR | Status: AC | PRN

## 2024-04-18 MED ORDER — HALOPERIDOL 0.5 MG PO TABS: 0.5000 mg | ORAL_TABLET | ORAL | Status: AC | PRN

## 2024-04-18 MED ORDER — MORPHINE SULFATE (CONCENTRATE) 10 MG /0.5 ML PO SOLN: 2.6000 mg | ORAL | Status: AC | PRN

## 2024-04-18 MED ORDER — LORAZEPAM 1 MG PO TABS: 1.0000 mg | ORAL_TABLET | ORAL | Status: AC | PRN

## 2024-04-18 MED ORDER — GLYCOPYRROLATE 1 MG PO TABS: 1.0000 mg | ORAL_TABLET | ORAL | Status: AC | PRN

## 2024-04-18 NOTE — Plan of Care (Signed)
   Problem: Coping: Goal: Level of anxiety will decrease Outcome: Progressing

## 2024-04-18 NOTE — TOC Progression Note (Addendum)
 Transition of Care [facility]) - Progression Note    Patient Details  Name: Jay Campbell MRN: [id number] Date of Birth: Mar 11, 1946  [facility] CM/SW Contact  Noreen KATHEE [place], [place] Phone Number: 04/18/2024, 1:57 PM  Clinical Narrative:     CSW reach out to [place] at [facility], and she confirmed that [facility] has no beds available at this time. ICM will follow.     Addendum 3:44 pm  CSW was made aware by MD that [facility] had a bed for patient. CSW followed up with [facility] and she confirmed bed and that transportation has already been arranged. Family has also been made aware. ICM signing off.      Barriers to Discharge: Other (must enter comment) ([facility] - they have no beds available right now)               Expected Discharge Plan and Services                                               Social Drivers of Health (SDOH) Interventions SDOH Screenings   Food Insecurity: Patient Unable To Answer (04/03/2024)  Housing: Unknown (04/03/2024)  Transportation Needs: Patient Unable To Answer (04/03/2024)  Utilities: Patient Unable To Answer (04/03/2024)  Financial Resource Strain: Low Risk (07/01/2023)   Received from [facility]  Social Connections: Patient Unable To Answer (04/03/2024)  Tobacco Use: Low Risk (04/04/2024)    Readmission Risk Interventions    04/18/2024    1:57 PM 04/16/2024    7:39 AM 04/01/2024   11:17 AM  Readmission Risk Prevention Plan  Transportation Screening Complete Complete Complete  HRI or Home Care Consult Complete Complete   Social Work Consult for Recovery Care Planning/Counseling Complete Complete   Palliative Care Screening Complete Complete   Medication Review Ocean[employer]) Complete Complete Complete  PCP or Specialist appointment within 3-5 days of discharge   Not Complete  HRI or Home Care Consult   Complete  SW Recovery Care/Counseling Consult   Complete  Palliative Care Screening   Not Complete  Skilled  Nursing Facility   Complete

## 2024-04-18 NOTE — Progress Notes (Signed)
 "          PROGRESS NOTE  Jay Campbell FMW:[id number] DOB: [birth year] DOA: 04/03/2024 PCP: [name], MD  Brief History:  79 y.o. male with medical history significant for Alzheimer's dementia, history of agitation due to dementia, coronary artery disease, permanent atrial fibrillation previously on Eliquis , complete heart block status post pacemaker placement, chronic thrombocytopenia, type 2 diabetes, hypertension, hyperlipidemia, GERD, BPH, who presents to the ER via EMS from SNF due to shortness of breath and generalized weakness.  The patient was discharged 1 day prior to readmission after being admitted for sepsis secondary to pneumonia and acute hypoxic respiratory failure requiring 2 L nasal cannula.  Staff at SNF reported the patient was more short of breath and lethargic than baseline today.  EMS was activated.  He received albuterol  nebs en route via EMS.   In the ER, hypoxic with O2 saturation of 87% on 2 L nasal cannula.  The patient is alert and confused in the setting of dementia.  Frail-appearing with pressure wounds in heels.  He is minimally verbal and unable to provide a history.     A chest x-ray revealed increasing left basilar infiltrates and small effusion.  Prominent cardiac shadow with stable pacemaker.  Lactic acid 3.9.  There was concern for sepsis.  Subsequently, his CT chest abdomen pelvis without contrast revealed left lower lobe and lingular consolidation with patchy bilateral ground glass opacities more consistent with multifocal pneumonia.  Compressive atelectasis versus additional pneumonia in the right lower lobe.  Mild to moderate bilateral pleural effusions, right greater than left.  Cirrhotic liver morphology with moderate abdominal pelvic ascites and splenomegaly.  Gastric wall thickening may be related to portal gastropathy or gastritis.  Right upper lobe nephrolithiasis.  Coronary artery disease, aortic atherosclerosis.  Sigmoid diverticulosis.  Moderate stool  burden in the colon.   The patient received 1 dose of cefepime  and azithromycin  in the ER.  TR[facility], hospitalist service, was asked to admit for worsening pneumonia.  Subsequently, the patient's antibiotics were discontinued as the patient received a full course of antibiotics during his last hospitalization from 03/24/2024 to 03/31/2024.  He remained afebrile and hemodynamically stable. Unfortunately, the patient's mental status did not improve much.  He was noted to be dehydrated with a free water  deficit.  Sodium went up to 155.  He was started on IV hypotonic fluid.  Although his sodium did gradually improve, the patient remained somnolent with minimal to no p.o. intake.  He continued to have dysphagia.  The patient was seen by speech therapy.  MBS was performed and did not reveal aspiration; however, patient was felt to be at continued risk for aspiration secondary to his impaired cognition and deconditioning.  A dysphagia 1 diet was ultimately recommended.  Palliative medicine was consulted.  After meetings with the patient's family, the patient's son was ultimately agreeable to transition to full comfort measures after the patient was accepted to residential hospice.   A bed was not available at [facility].  The patient was admitted to the hospital for hospital hospice care until bed is available at [facility] house.  Palliative medicine continue to follow.  They spoke with patient's son and verified desire to transition to full comfort measures.   Patient is stable and at the moment continue waiting for bed availability at the [facility] house to continue symptomatic management and hospice care.  Will continue comfort care.  Assessment/Plan: Adult Failure to Thrive -- unfortunately, despite maximal medical interventions and recent  prolonged hospitalization of 7 days he has bounced back into the hospital within 24 hours. -- Patient has significant dysphagia and unable to eat/drink enough to maintain body  requirements for fluids and nutrition -- Patient was initially started on IV antibiotics and IV fluids. --He did not improve clinically.  He continued to be somnolent with very poor to no oral intake -- continue DNR/DNI orders present on admission -Ultimately, patient's family agreed to transition to full comfort measures after the patient was accepted to residential hospice. - Patient now has been transitioned to general inpatient hospice and for comfort measures have been instituted. - added oral morphine  solution   Multifocal pneumonia, seen on CT scan -- Pneumonia was fully treated during previous hospitalization -- DC antibiotics -- Patient remained afebrile hemodynamically stable - Patient now has been transitioned to general inpatient hospice and for comfort measures have been instituted.   Severe Dysphagia -- SLP evaluation requested -- MBS done--no definitive aspiration -- Patient continued to remain high risk for aspiration -Dysphagia 1 diet ordered>> pleasure feeding -Oral intake remains very poor - Patient now has been transitioned to general inpatient hospice and for comfort measures have been instituted.   Bilateral mild to moderate pleural effusions, seen on CT scan -- Closely monitor volume status -- Secondary to hepatic hydrothorax -Cirrhotic liver noted on CT abdomen and pelvis   Chronic hypoxic respiratory failure secondary to the above -- Recently started on 2 L O2 supplementation during previous admission for pneumonia. -- Unfortunately supplemental oxygen  wasn't provided at facility -Stable on 2 to 3 L presently. - Patient now has been transitioned to general inpatient hospice and for comfort measures have been instituted.   Lactic acidosis -- Secondary to Hypoxia, hypoperfusion, volume depletion -- Lactic acid is trending down  -- Follow peripheral blood cultures x 2. -- IV fluid hydration  -- Sepsis has been ruled out   Elevated troponin, suspect demand  ischemia secondary to the above History of coronary artery disease Aortic atherosclerosis High-sensitivity troponin 23, 26. No evidence of acute ischemia on twelve-lead EKG No chest pain reported. Monitor on telemetry.   Chronic HFpEF Last 2D echo done on 05/09/2019 revealed LVEF 60 to 65% Presented with dyspnea, cardiomegaly, and bilateral pleural effusions -04/01/2024 echo EF 60 to 65%, no WMA, normal RVF - Patient now has been transitioned to general inpatient hospice and for comfort measures have been instituted.   Hypernatremia Speech therapy is consulted to rule out dysphagia Hypotonic IV fluid to correct free water  deficit --Patient now transition to full comfort measures   Hypokalemia -initially repleted -now on comfort measures   Gastric wall thickening, may be related to portal gastropathy or gastritis, seen on CT scan. IV PPI Protonix  40 mg daily initially No reported abdominal pain or GI bleed Avoid NSAIDs Patient may transition to full comfort measures   Generalized weakness He is chronically debilitated and this is unchanged Palliative consultation requested    Type 2 diabetes with hyperglycemia Last hemoglobin A1c 7.3 on 03/24/2024   Hypertension Resume home Lopressor  Closely monitor vital signs.   Alzheimer's dementia with history of agitation Resume home regimen. Aricept  held due to transient bradycardia. -now on comfort measures care   Permanent atrial fibrillation, no longer on Eliquis  due to chronic thrombocytopenia Currently rate controlled --- Patient now has been transitioned to general inpatient hospice and for comfort measures have been instituted.     Family Communication:   no family members present at bedside   Consultants:  palliative  Code Status:  FULL COMFORT   DVT Prophylaxis:  FULL COMFORT     Procedures: As Listed in Progress Note Above   Antibiotics: none  Subjective: In no acute distress.  Does not respond.  No  nausea, no vomiting, no fever.  Did receive morphine  early morning today.  Objective: Vitals:   04/15/24 1759 04/15/24 2016 04/16/24 1939 04/17/24 2019  BP: (!) 160/60 135/60 (!) 137/54 (!) 154/47  Pulse: 62 68 63 61  Resp:  18 16 19   Temp:  98.3 F (36.8 C) 99.5 F (37.5 C) 99.4 F (37.4 C)  TempSrc:  Oral Oral Oral  SpO2:  93% 91% 90%    Intake/Output Summary (Last 24 hours) at 04/18/2024 1354 Last data filed at 04/18/2024 0935 Gross per 24 hour  Intake 0 ml  Output --  Net 0 ml     Weight change:   Exam: General exam: Unresponsive; in no major distress. Regular heart rate Mild wheezing bilaterally Data Reviewed: I have personally reviewed following labs and imaging studies  HbA1C: No results for input(s): HGBA1C in the last 72 hours.  Urine analysis:    Component Value Date/Time   COLORURINE COLORLESS (A) 04/01/2024 0658   APPEARANCEUR CLEAR 04/01/2024 0658   LABSPEC 1.008 04/01/2024 0658   PHURINE 5.0 04/01/2024 0658   GLUCOSEU NEGATIVE 04/01/2024 0658   HGBUR MODERATE (A) 04/01/2024 0658   BILIRUBINUR NEGATIVE 04/01/2024 0658   KETONESUR NEGATIVE 04/01/2024 0658   PROTEINUR NEGATIVE 04/01/2024 0658   NITRITE NEGATIVE 04/01/2024 0658   LEUKOCYTESUR NEGATIVE 04/01/2024 0658   Sepsis Labs:  No results found for this or any previous visit (from the past 240 hours).    Scheduled Meds:  antiseptic oral rinse  15 mL Mouth Rinse TID   nystatin    Topical TID   Continuous Infusions:  Procedures/Studies: DG Swallowing Func-Speech Pathology Result Date: 04/02/2024 Table formatting from the original result was not included. Modified Barium Swallow Study Patient Details Name: Jay Campbell MRN: [id number] Date of Birth: 11-21-1945 Today's Date: 04/02/2024 HPI/PMH: HPI: Jay Campbell is a 79 y.o. male with medical history significant for Alzheimer's dementia, history of agitation due to dementia, coronary artery disease, permanent atrial fibrillation previously  on Eliquis , complete heart block status post pacemaker placement, chronic thrombocytopenia, type 2 diabetes, hypertension, hyperlipidemia, GERD, BPH, who presents to the ER via EMS from SNF due to shortness of breath and generalized weakness. The patient was discharged yesterday after being admitted for sepsis secondary to pneumonia and acute hypoxic respiratory failure requiring 2 L nasal cannula. Staff at SNF reported the patient was more short of breath and lethargic than baseline today. EMS was activated. He received albuterol  nebs en route via EMS. In the ER, hypoxic with O2 saturation of 87% on 2 L nasal cannula. The patient is alert and confused in the setting of dementia. Frail-appearing with pressure wounds in heels. He is minimally verbal and unable to provide a history. A chest x-ray revealed increasing left basilar infiltrates and small effusion. Prominent cardiac shadow with stable pacemaker. Lactic acid 3.9. There was concern for sepsis. Subsequently, his CT chest abdomen pelvis without contrast revealed left lower lobe and lingular consolidation with patchy bilateral ground glass opacities more consistent with multifocal pneumonia. Compressive atelectasis versus additional pneumonia in the right lower lobe. Mild to moderate bilateral pleural effusions, right greater than left. Pt was seen by SLP service on Thursday and was discharged over the weekend. BSE completed and pt exhibiting overt s/s of aspiration  and placed NPO. MBS ordered to determine safest diet and assess swallow function objectively. Clinical Impression: Clinical Impression: Pt presents with mild oropharyngeal dysphagia compounded by cognitive-based dysphagia c/b escape to lateral buccal cavity/floor of mouth and slow, prolonged mastication with solids.  Delayed initiation of tongue motion (oral holding) and delayed initiation of the swallow (typical for age) at the level of the valleculae throughout study.  Diminished pharyngeal  stripping wave and partial epiglottic inflection observed potentially d/t bulging PPW/?cervical osteophytes.  Decreased tongue base retraction resulting in diffuse residue within the vallecular space, pyriform sinuses and posterior pharyngeal wall intermittently during study.  It should be noted pt exhibited xerostomia prior to any po intake d/t NPO status which may have contributed to amount of residue within pharynx and other structures.  As the study progressed and compensatory strategies instilled including liquid wash, alternating solids/liquids and multiple effortful swallows, residue cleared to mild-insignificant amount within affected structures.  Partial distension/obstruction of flow observed in UES d/t prominent CP bar presence.  NO aspiration and/or penetration observed throughout study, but d/t pt's impaired cognition and deconditioning/dysphagia, he is at risk for aspiration if swallow strategies/A with meals/full supervision is not followed.  Recommend initiating a Dysphagia 1(puree)/thin liquid diet via tsp amounts with FULL supervision/A with meals.  ST will f/u for dysphagia tx/education during remainder of acute stay.  ST should f/u at next venue of care. Factors that may increase risk of adverse event in presence of aspiration Noe & Lianne 2021): Factors that may increase risk of adverse event in presence of aspiration Noe & Lianne 2021): Poor general health and/or compromised immunity; Reduced cognitive function; Frail or deconditioned; Weak cough; Aspiration of thick, dense, and/or acidic materials Recommendations/Plan: Swallowing Evaluation Recommendations Swallowing Evaluation Recommendations Recommendations: PO diet PO Diet Recommendation: Dysphagia 1 (Pureed); Thin liquids (Level 0) Liquid Administration via: Spoon Medication Administration: Crushed with puree Supervision: Full assist for feeding; Full supervision/cueing for swallowing strategies Swallowing strategies  : Slow  rate; Small bites/sips; Minimize environmental distractions; effortful swallow; Multiple dry swallows after each bite/sip; Follow solids with liquids Postural changes: Position pt fully upright for meals; Stay upright 30-60 min after meals Oral care recommendations: Oral care BID (2x/day); Staff/trained caregiver to provide oral care Treatment Plan Treatment Plan Treatment recommendations: Therapy as outlined in treatment plan below Follow-up recommendations: Skilled nursing-short term rehab (<3 hours/day) Functional status assessment: Patient has had a recent decline in their functional status and demonstrates the ability to make significant improvements in function in a reasonable and predictable amount of time. Treatment frequency: Min 2x/week Treatment duration: 1 week Interventions: Aspiration precaution training; Compensatory techniques; Patient/family education; Trials of upgraded texture/liquids; Diet toleration management by SLP Recommendations Recommendations for follow up therapy are one component of a multi-disciplinary discharge planning process, led by the attending physician.  Recommendations may be updated based on patient status, additional functional criteria and insurance authorization. Assessment: Orofacial Exam: Orofacial Exam Oral Cavity: Oral Hygiene: Xerostomia Oral Cavity - Dentition: Poor condition; Missing dentition Orofacial Anatomy: WFL Oral Motor/Sensory Function: Generalized oral weakness Anatomy: Anatomy: WFL Boluses Administered: Boluses Administered Boluses Administered: Thin liquids (Level 0); Mildly thick liquids (Level 2, nectar thick); Moderately thick liquids (Level 3, honey thick); Puree; Solid  Oral Impairment Domain: Oral Impairment Domain Lip Closure: No labial escape Tongue control during bolus hold: Escape to lateral buccal cavity/floor of mouth Bolus preparation/mastication: Slow prolonged chewing/mashing with complete recollection Bolus transport/lingual motion: Delayed  initiation of tongue motion (oral holding) Oral residue: Trace residue lining oral  structures Location of oral residue : Tongue Initiation of pharyngeal swallow : Valleculae  Pharyngeal Impairment Domain: Pharyngeal Impairment Domain Soft palate elevation: No bolus between soft palate (SP)/pharyngeal wall (PW) Laryngeal elevation: Complete superior movement of thyroid  cartilage with complete approximation of arytenoids to epiglottic petiole Anterior hyoid excursion: Complete anterior movement Epiglottic movement: Partial inversion Laryngeal vestibule closure: Complete, no air/contrast in laryngeal vestibule Pharyngeal stripping wave : Present - diminished Pharyngeal contraction (A/P view only): N/A Pharyngoesophageal segment opening: Partial distention/partial duration, partial obstruction of flow Tongue base retraction: Narrow column of contrast or air between tongue base and PPW Pharyngeal residue: Collection of residue within or on pharyngeal structures Location of pharyngeal residue: Diffuse (>3 areas)  Esophageal Impairment Domain: Esophageal Impairment Domain Esophageal clearance upright position: Complete clearance, esophageal coating Pill: Pill Consistency administered: -- (n/a) Penetration/Aspiration Scale Score: Penetration/Aspiration Scale Score 1.  Material does not enter airway: Thin liquids (Level 0); Mildly thick liquids (Level 2, nectar thick); Moderately thick liquids (Level 3, honey thick); Puree; Solid Compensatory Strategies: Compensatory Strategies Compensatory strategies: Yes Effortful swallow: Effective Effective Effortful Swallow: Thin liquid (Level 0); Mildly thick liquid (Level 2, nectar thick); Puree Multiple swallows: Effective Effective Multiple Swallows: Thin liquid (Level 0); Mildly thick liquid (Level 2, nectar thick); Puree Liquid wash: Effective Effective Liquid Wash: Thin liquid (Level 0); Mildly thick liquid (Level 2, nectar thick); Puree; Solid   General Information: Caregiver  present: No  Diet Prior to this Study: NPO   Temperature : Normal   Respiratory Status: WFL   Supplemental O2: Nasal cannula (3L)   History of Recent Intubation: No  Behavior/Cognition: Alert; Cooperative; Distractible; Requires cueing Self-Feeding Abilities: Dependent for feeding Baseline vocal quality/speech: Hypophonia/low volume; Dysphonic Volitional Cough: Able to elicit Volitional Swallow: Unable to elicit Exam Limitations: No limitations Goal Planning: Prognosis for improved oropharyngeal function: Good Barriers to Reach Goals: Cognitive deficits No data recorded Patient/Family Stated Goal: n/a Consulted and agree with results and recommendations: Patient; Pt unable/family or caregiver not available; Nurse; Physician Pain: Pain Assessment Pain Assessment: Faces Faces Pain Scale: 2 Breathing: 0 Negative Vocalization: 1 Facial Expression: 1 Body Language: 0 Consolability: 0 PAINAD Score: 0 Pain Location: generalized with movement Pain Descriptors / Indicators: Discomfort; Grimacing Pain Intervention(s): Limited activity within patient's tolerance; Repositioned End of Session: Start Time:SLP Start Time (ACUTE ONLY): 1014 Stop Time: SLP Stop Time (ACUTE ONLY): 1050 Time Calculation:SLP Time Calculation (min) (ACUTE ONLY): 36 min Charges: SLP Evaluations $ SLP Speech Visit: 1 Visit SLP Evaluations $BSS Swallow: 1 Procedure $MBS Swallow: 1 Procedure SLP visit diagnosis: SLP Visit Diagnosis: Dysphagia, oropharyngeal phase (R13.12) Past Medical History: Past Medical History: Diagnosis Date  Alzheimer's dementia (HCC)   Anxiety   Atrial fibrillation (HCC)   Permanent  Coronary artery disease   Mild nonobstructive 1/12  DM (diabetes mellitus) (HCC)   GERD (gastroesophageal reflux disease)   Gout   Hearing disorder, cochlear   Hiatal hernia   HLD (hyperlipidemia)   HTN (hypertension)   S/P AV nodal ablation   s/p SJM PPM; gen change 02-01-13 by Dr Kelsie  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  Warfarin anticoagulation   Past Surgical History: Past Surgical History: Procedure Laterality Date  CATARACT EXTRACTION Right   COCHLEAR IMPLANT Right   HEMORRHOID BANDING    KNEE ARTHROSCOPY Right 1992  Dr Brenna   PACEMAKER GENERATOR CHANGE Bilateral 02/01/2013  Procedure: PACEMAKER GENERATOR CHANGE;  Surgeon: Lynwood JONETTA Kelsie, MD;  Location: [facility]ATH [facility];  Service: Cardiovascular;  Laterality: Bilateral;  PACEMAKER PLACEMENT  2002; 2014  SJM implanted by Dr Waddell with AV nodal ablation performed; gen change to [employer] SR RF by Dr Kelsie 02-01-13  RETINAL LASER PROCEDURE Left   SLT LASER APPLICATION Left 12/14/2015  Procedure: SLT LASER APPLICATION;  Surgeon: Dow JULIANNA Burke, MD;  Location: [facility];  Service: Ophthalmology;  Laterality: Left; Pat Adams,M.S.,CCC-SLP 04/02/2024, 11:23 AM  ECHOCARDIOGRAM COMPLETE Result Date: 04/01/2024    ECHOCARDIOGRAM REPORT   Patient Name:   Jay Campbell Date of Exam: 04/01/2024 Medical Rec #:  [id number]        Height:       65.0 in Accession #:    [id number]       Weight:       175.7 lb Date of Birth:  [birth year]        BSA:          1.872 m Patient Age:    78 years         BP:           158/69 mmHg Patient Gender: M                HR:           63 bpm. Exam Location:  [facility] Procedure: 2D Echo, Cardiac Doppler and Color Doppler (Both Spectral and Color            Flow Doppler were utilized during procedure). Indications:    CHF-Acute Diastolic I50.31  History:        Patient has prior history of Echocardiogram examinations, most                 recent 05/09/2019. CAD, Pacemaker, Arrythmias:Atrial Fibrillation                 and Complete heart block (HCC); Risk Factors:Hypertension,                 Diabetes, Dyslipidemia and Non-Smoker. Alzheimer's dementia                 (HCC) (From Hx).  Sonographer:    Aida Pizza RCS Referring Phys: [id number] TERRY LOISE HURST  Sonographer Comments: Image acquisition challenging due to patient behavioral factors. IMPRESSIONS  1. Left ventricular ejection fraction,  by estimation, is 60 to 65%. The left ventricle has normal function. The left ventricle has no regional wall motion abnormalities. There is mild left ventricular hypertrophy. Left ventricular diastolic parameters are indeterminate.  2. Right ventricular systolic function is normal. The right ventricular size is mildly enlarged. There is normal pulmonary artery systolic pressure.  3. Left atrial size was severely dilated.  4. Right atrial size was severely dilated.  5. The mitral valve is abnormal. Mild to moderate mitral valve regurgitation. No evidence of mitral stenosis. Severe mitral annular calcification.  6. The aortic valve is tricuspid. There is mild calcification of the aortic valve. There is mild thickening of the aortic valve. Aortic valve regurgitation is not visualized. No aortic stenosis is present. Aortic valve mean gradient measures 6.0 mmHg.  7. Aortic dilatation noted. There is borderline dilatation of the aortic root, measuring 39 mm.  8. The inferior vena cava is normal in size with <50% respiratory variability, suggesting right atrial pressure of 8 mmHg. Comparison(s): No significant change from prior study. FINDINGS  Left Ventricle: Left ventricular ejection fraction, by estimation, is 60 to 65%. The left ventricle has normal function. The left ventricle has no regional wall motion abnormalities. Strain was performed and the global  longitudinal strain is indeterminate. The left ventricular internal cavity size was normal in size. There is mild left ventricular hypertrophy. Left ventricular diastolic parameters are indeterminate. Right Ventricle: The right ventricular size is mildly enlarged. No increase in right ventricular wall thickness. Right ventricular systolic function is normal. There is normal pulmonary artery systolic pressure. The tricuspid regurgitant velocity is 2.34  m/s, and with an assumed right atrial pressure of 8 mmHg, the estimated right ventricular systolic pressure is 29.9  mmHg. Left Atrium: Left atrial size was severely dilated. Right Atrium: Right atrial size was severely dilated. Pericardium: There is no evidence of pericardial effusion. Mitral Valve: The mitral valve is abnormal. Severe mitral annular calcification. Mild to moderate mitral valve regurgitation. No evidence of mitral valve stenosis. Tricuspid Valve: The tricuspid valve is normal in structure. Tricuspid valve regurgitation is mild . No evidence of tricuspid stenosis. Aortic Valve: The aortic valve is tricuspid. There is mild calcification of the aortic valve. There is mild thickening of the aortic valve. Aortic valve regurgitation is not visualized. No aortic stenosis is present. Aortic valve mean gradient measures 6.0 mmHg. Aortic valve peak gradient measures 11.3 mmHg. Aortic valve area, by VTI measures 1.90 cm. Pulmonic Valve: The pulmonic valve was normal in structure. Pulmonic valve regurgitation is trivial. No evidence of pulmonic stenosis. Aorta: Aortic dilatation noted. There is borderline dilatation of the aortic root, measuring 39 mm. Venous: The inferior vena cava is normal in size with less than 50% respiratory variability, suggesting right atrial pressure of 8 mmHg. IAS/Shunts: No atrial level shunt detected by color flow Doppler. Additional Comments: 3D was performed not requiring image post processing on an independent workstation and was indeterminate.  LEFT VENTRICLE PLAX 2D LVIDd:         4.80 cm LVIDs:         3.00 cm LV PW:         1.20 cm LV IVS:        1.20 cm LVOT diam:     2.10 cm LV SV:         56 LV SV Index:   30 LVOT Area:     3.46 cm  RIGHT VENTRICLE RV S prime:     14.30 cm/s TAPSE (M-mode): 2.7 cm LEFT ATRIUM              Index        RIGHT ATRIUM           Index LA diam:        5.90 cm  3.15 cm/m   RA Area:     38.35 cm LA Vol (A2C):   138.5 ml 73.98 ml/m  RA Volume:   150.00 ml 80.12 ml/m LA Vol (A4C):   182.0 ml 97.21 ml/m LA Biplane Vol: 165.0 ml 88.13 ml/m  AORTIC VALVE AV  Area (Vmax):    1.64 cm AV Area (Vmean):   1.83 cm AV Area (VTI):     1.90 cm AV Vmax:           168.00 cm/s AV Vmean:          106.000 cm/s AV VTI:            0.294 m AV Peak Grad:      11.3 mmHg AV Mean Grad:      6.0 mmHg LVOT Vmax:         79.40 cm/s LVOT Vmean:        56.100 cm/s LVOT VTI:  0.161 m LVOT/AV VTI ratio: 0.55  AORTA Ao Root diam: 3.90 cm MITRAL VALVE                TRICUSPID VALVE MV Area (PHT): 3.27 cm     TR Peak grad:   21.9 mmHg MV Decel Time: 232 msec     TR Vmax:        234.00 cm/s MR Peak grad: 119.7 mmHg MR Mean grad: 75.0 mmHg     SHUNTS MR Vmax:      547.00 cm/s   Systemic VTI:  0.16 m MR Vmean:     401.0 cm/s    Systemic Diam: 2.10 cm MV E velocity: 107.00 cm/s MV A velocity: 33.30 cm/s MV E/A ratio:  3.21 Vishnu Priya Mallipeddi Electronically signed by Diannah Late Mallipeddi Signature Date/Time: 04/01/2024/2:10:59 PM    Final    CT CHEST ABDOMEN PELVIS WO CONTRAST Result Date: 04/01/2024 EXAM: CT CHEST, ABDOMEN AND PELVIS WITHOUT CONTRAST 04/01/2024 01:25:10 AM TECHNIQUE: CT of the chest, abdomen and pelvis was performed without the administration of intravenous contrast. Multiplanar reformatted images are provided for review. Automated exposure control, iterative reconstruction, and/or weight based adjustment of the mA/kV was utilized to reduce the radiation dose to as low as reasonably achievable. COMPARISON: 02/07/2023. CLINICAL HISTORY: Sepsis. FINDINGS: CHEST: MEDIASTINUM AND LYMPH NODES: Cardiomegaly. Pacer wires in the right heart. Coronary artery aortic atherosclerosis. The central airways are clear. No mediastinal, hilar or axillary lymphadenopathy. LUNGS AND PLEURA: Mild to moderate bilateral pleural effusions, right greater than left. Consolidation with air bronchograms in the left lower lobe and posterior lingula concerning for pneumonia. Compressive atelectasis or pneumonia in the right lower lobe. Patchy ground glass airspace opacities also likely related  to pneumonia. No pneumothorax. ABDOMEN AND PELVIS: LIVER: Mild nodular contours of the liver suggest cirrhosis. GALLBLADDER AND BILE DUCTS: High-density material within the gallbladder could reflect small stones or contrast material. No biliary ductal dilatation. SPLEEN: The spleen is mildly enlarged at 13 cm craniocaudal length. PANCREAS: No acute abnormality. ADRENAL GLANDS: No acute abnormality. KIDNEYS, URETERS AND BLADDER: 1.6 cm stone in the upper pole of the right kidney. No hydronephrosis. No perinephric or periureteral stranding. Urinary bladder is unremarkable. GI AND BOWEL: The stomach is decompressed but appears to be thick-walled. This could be related to portal gastropathy or gastritis. Sigmoid diverticulosis. No active diverticulitis. Moderate stool burden throughout the colon. There is no bowel obstruction. REPRODUCTIVE ORGANS: Prostate enlargement. PERITONEUM AND RETROPERITONEUM: Moderate free fluid in the abdomen and pelvis. No free air. VASCULATURE: Aorta is normal in caliber. Aortoiliac catheter sclerosis. ABDOMINAL AND PELVIS LYMPH NODES: No lymphadenopathy. BONES AND SOFT TISSUES: Stable severe compression fracture at L1. Diffuse osteopenia. No focal soft tissue abnormality. IMPRESSION: 1. Left lower lobe and lingular consolidation with patchy bilateral ground-glass opacities, most consistent with multifocal pneumonia. 2. Compressive atelectasis versus additional pneumonia in the right lower lobe. 3. Mild to moderate bilateral pleural effusions, right greater than left. 4. Cirrhotic liver morphology with moderate abdominopelvic ascites and splenomegaly . 5. Gastric wall thickening may be related to portal gastropathy or gastritis . 6. Right upper pole nephrolithiasis . 7. Coronary artery disease, aortic atherosclerosis. 8. Sigmoid diverticulosis. Moderate stool burden in the colon. Electronically signed by: Franky Crease MD 04/01/2024 01:36 AM EST RP Workstation: [id number]   DG Chest Port 1  View Result Date: 04/01/2024 EXAM: 1 VIEW(S) XRAY OF THE CHEST 04/01/2024 01:00:00 AM COMPARISON: Comparison from 03/24/2024. CLINICAL HISTORY: Increased lethargy and shortness of breath. FINDINGS: LUNGS AND  PLEURA: Increasing left basilar infiltrate. Small left pleural effusion. No pneumothorax. HEART AND MEDIASTINUM: The cardiac shadow is prominent. The pacemaker is again seen and stable. BONES AND SOFT TISSUES: No bony abnormality is noted. IMPRESSION: 1. Increasing left basilar infiltrate and small effusion. 2. Prominent cardiac shadow with stable pacemaker. Electronically signed by: Oneil Devonshire MD 04/01/2024 01:04 AM EST RP Workstation: [id number]   DG Chest Port 1 View Result Date: 03/24/2024 CLINICAL DATA:  Respiratory distress. EXAM: PORTABLE CHEST 1 VIEW COMPARISON:  03/03/2024 FINDINGS: The cardio pericardial silhouette is enlarged. Retrocardiac atelectasis or infiltrate noted. Right lung clear. Single lead left-sided permanent pacemaker again noted. Telemetry leads overlie the chest. IMPRESSION: Retrocardiac atelectasis or infiltrate. Electronically Signed   By: Camellia Candle M.D.   On: 03/24/2024 07:22    Derryl Duval, MD [facility]  If 7PM-7AM, please contact night-coverage [email or web address] Password [email or web address] 04/18/2024, 1:54 PM   LOS: 15 days   "

## 2024-04-18 NOTE — Discharge Summary (Signed)
 Physician Discharge Summary  Jay Campbell FMW:[id number] DOB: March 22, 1945 DOA: 04/03/2024  PCP: [name], MD  Admit date: 04/03/2024 Discharge date: 04/18/2024  Admitted From: Home Disposition: Hospice [facility])  Brief/Interim Summary:  79 y.o. male with medical history significant for Alzheimer's dementia, history of agitation due to dementia, coronary artery disease, permanent atrial fibrillation previously on Eliquis , complete heart block status post pacemaker placement, chronic thrombocytopenia, type 2 diabetes, hypertension, hyperlipidemia, GERD, BPH, who presents to the ER via EMS from SNF due to shortness of breath and generalized weakness.  The patient was discharged 1 day prior to readmission after being admitted for sepsis secondary to pneumonia and acute hypoxic respiratory failure requiring 2 L nasal cannula.  Staff at SNF reported the patient was more short of breath and lethargic than baseline today.  EMS was activated.  He received albuterol  nebs en route via EMS.   In the ER, hypoxic with O2 saturation of 87% on 2 L nasal cannula.  The patient is alert and confused in the setting of dementia.  Frail-appearing with pressure wounds in heels.  He is minimally verbal and unable to provide a history.     A chest x-ray revealed increasing left basilar infiltrates and small effusion.  Prominent cardiac shadow with stable pacemaker.  Lactic acid 3.9.  There was concern for sepsis.  Subsequently, his CT chest abdomen pelvis without contrast revealed left lower lobe and lingular consolidation with patchy bilateral ground glass opacities more consistent with multifocal pneumonia.  Compressive atelectasis versus additional pneumonia in the right lower lobe.  Mild to moderate bilateral pleural effusions, right greater than left.  Cirrhotic liver morphology with moderate abdominal pelvic ascites and splenomegaly.  Gastric wall thickening may be related to portal gastropathy or gastritis.   Right upper lobe nephrolithiasis.  Coronary artery disease, aortic atherosclerosis.  Sigmoid diverticulosis.  Moderate stool burden in the colon.   Patient was initiated on antibiotics for pneumonia. Unfortunately, the patient's mental status did not improve much.  He was noted to be dehydrated with a free water  deficit.  Sodium went up to 155.  He was started on IV hypotonic fluid.  Although his sodium did gradually improve, the patient remained somnolent with minimal to no p.o. intake.  He continued to have dysphagia.  The patient was seen by speech therapy.  MBS was performed and did not reveal aspiration; however, patient was felt to be at continued risk for aspiration secondary to his impaired cognition and deconditioning.  A dysphagia 1 diet was ultimately recommended.  Palliative medicine was consulted.  After meetings with the patient's family, the patient's son was ultimately agreeable to transition to full comfort measures after the patient was accepted to residential hospice.   Patient was transitioned to comfort care/full comfort measures in the hospital until bed became available at the [facility].   Discharge diagnoses Adult Failure to Thrive  Multifocal pneumonia, seen on CT scan Severe Dysphagia  Bilateral mild to moderate pleural effusions, seen on CT scan Liver cirrhosis Chronic hypoxic respiratory failure secondary to the above  Lactic acidosis -- Secondary to Hypoxia, hypoperfusion, volume depletion  Elevated troponin, suspect demand ischemia secondary to the above History of coronary artery disease  Chronic HFpEF Hypernatremia Hypokalemia -Gastric wall thickening, may be related to portal gastropathy or gastritis, seen on CT scan.  Type 2 diabetes with hyperglycemia  Hypertension  Alzheimer's dementia with history of agitation  Permanent atrial fibrillation, no longer on Eliquis  due to chronic thrombocytopenia  Discharge Diagnoses:  Principal Problem:   End of life  care    Discharge Instructions  Discharge Instructions     Discharge wound care:   Complete by: As directed    As needed      Allergies as of 04/18/2024       Reactions   Contrast Media [iodinated Contrast Media] Other (See Comments)   No reaction listed on MAR   Vioxx [rofecoxib]    On pt MAR   Shellfish Allergy Itching, Rash   Sulfonamide Derivatives Itching, Rash        Medication List     STOP taking these medications    acetaminophen  650 MG CR tablet Commonly known as: TYLENOL    allopurinol  100 MG tablet Commonly known as: ZYLOPRIM    ARIPiprazole  5 MG tablet Commonly known as: ABILIFY    BD Insulin  Syringe U/F 31G X 5/16 0.3 ML Misc Generic drug: Insulin  Syringe-Needle U-100   divalproex  125 MG capsule Commonly known as: DEPAKOTE  SPRINKLE   donepezil  10 MG tablet Commonly known as: ARICEPT    famotidine  20 MG tablet Commonly known as: PEPCID    ferrous sulfate  325 (65 FE) MG EC tablet   free water  Soln   FreeStyle Libre 14 Day Sensor Misc   HumuLIN 70/30 (70-30) 100 UNIT/ML injection Generic drug: insulin  NPH-regular Human   latanoprost  0.005 % ophthalmic solution Commonly known as: XALATAN    memantine  10 MG tablet Commonly known as: NAMENDA    OneTouch Verio test strip Generic drug: glucose blood   pantoprazole  40 MG tablet Commonly known as: PROTONIX    simvastatin  40 MG tablet Commonly known as: ZOCOR    tamsulosin  0.4 MG Caps capsule Commonly known as: FLOMAX        TAKE these medications    glycopyrrolate  1 MG tablet Commonly known as: ROBINUL  Take 1 tablet (1 mg total) by mouth every 4 (four) hours as needed (excessive secretions).   glycopyrrolate  0.2 MG/ML injection Commonly known as: ROBINUL  Inject 1 mL (0.2 mg total) into the skin every 4 (four) hours as needed (excessive secretions).   haloperidol  0.5 MG tablet Commonly known as: HALDOL  Take 1 tablet (0.5 mg total) by mouth every 4 (four) hours as needed for  agitation (or delirium).   haloperidol  lactate 5 MG/ML injection Commonly known as: HALDOL  Inject 0.1 mLs (0.5 mg total) into the vein every 4 (four) hours as needed (or delirium).   haloperidol  2 MG/ML solution Commonly known as: HALDOL  Place 0.3 mLs (0.6 mg total) under the tongue every 4 (four) hours as needed for agitation (or delirium).   LORazepam  2 MG/ML concentrated solution Commonly known as: ATIVAN  Place 0.5 mLs (1 mg total) under the tongue every 4 (four) hours as needed for anxiety.   LORazepam  1 MG tablet Commonly known as: ATIVAN  Take 1 tablet (1 mg total) by mouth every 4 (four) hours as needed for anxiety.   morphine  CONCENTRATE 10 mg / 0.5 ml concentrated solution Place 0.13-0.25 mLs (2.6-5 mg total) under the tongue every 2 (two) hours as needed for severe pain (pain score 7-10) or moderate pain (pain score 4-6) (EOL care, increased RR/WOB).               Discharge Care Instructions  (From admission, onward)           Start     Ordered   04/18/24 0000  Discharge wound care:       Comments: As needed   04/18/24 1439            Allergies[1]  Consultations:    Procedures/Studies: DG Swallowing Func-Speech Pathology Result Date: 04/02/2024 Table formatting from the original result was not included. Modified Barium Swallow Study Patient Details Name: Jay Campbell MRN: [id number] Date of Birth: 11-17-1945 Today's Date: 04/02/2024 HPI/PMH: HPI: HADI DUBIN is a 79 y.o. male with medical history significant for Alzheimer's dementia, history of agitation due to dementia, coronary artery disease, permanent atrial fibrillation previously on Eliquis , complete heart block status post pacemaker placement, chronic thrombocytopenia, type 2 diabetes, hypertension, hyperlipidemia, GERD, BPH, who presents to the ER via EMS from SNF due to shortness of breath and generalized weakness. The patient was discharged yesterday after being admitted for sepsis  secondary to pneumonia and acute hypoxic respiratory failure requiring 2 L nasal cannula. Staff at SNF reported the patient was more short of breath and lethargic than baseline today. EMS was activated. He received albuterol  nebs en route via EMS. In the ER, hypoxic with O2 saturation of 87% on 2 L nasal cannula. The patient is alert and confused in the setting of dementia. Frail-appearing with pressure wounds in heels. He is minimally verbal and unable to provide a history. A chest x-ray revealed increasing left basilar infiltrates and small effusion. Prominent cardiac shadow with stable pacemaker. Lactic acid 3.9. There was concern for sepsis. Subsequently, his CT chest abdomen pelvis without contrast revealed left lower lobe and lingular consolidation with patchy bilateral ground glass opacities more consistent with multifocal pneumonia. Compressive atelectasis versus additional pneumonia in the right lower lobe. Mild to moderate bilateral pleural effusions, right greater than left. Pt was seen by SLP service on Thursday and was discharged over the weekend. BSE completed and pt exhibiting overt s/s of aspiration and placed NPO. MBS ordered to determine safest diet and assess swallow function objectively. Clinical Impression: Clinical Impression: Pt presents with mild oropharyngeal dysphagia compounded by cognitive-based dysphagia c/b escape to lateral buccal cavity/floor of mouth and slow, prolonged mastication with solids.  Delayed initiation of tongue motion (oral holding) and delayed initiation of the swallow (typical for age) at the level of the valleculae throughout study.  Diminished pharyngeal stripping wave and partial epiglottic inflection observed potentially d/t bulging PPW/?cervical osteophytes.  Decreased tongue base retraction resulting in diffuse residue within the vallecular space, pyriform sinuses and posterior pharyngeal wall intermittently during study.  It should be noted pt exhibited  xerostomia prior to any po intake d/t NPO status which may have contributed to amount of residue within pharynx and other structures.  As the study progressed and compensatory strategies instilled including liquid wash, alternating solids/liquids and multiple effortful swallows, residue cleared to mild-insignificant amount within affected structures.  Partial distension/obstruction of flow observed in UES d/t prominent CP bar presence.  NO aspiration and/or penetration observed throughout study, but d/t pt's impaired cognition and deconditioning/dysphagia, he is at risk for aspiration if swallow strategies/A with meals/full supervision is not followed.  Recommend initiating a Dysphagia 1(puree)/thin liquid diet via tsp amounts with FULL supervision/A with meals.  ST will f/u for dysphagia tx/education during remainder of acute stay.  ST should f/u at next venue of care. Factors that may increase risk of adverse event in presence of aspiration Noe & Lianne 2021): Factors that may increase risk of adverse event in presence of aspiration Noe & Lianne 2021): Poor general health and/or compromised immunity; Reduced cognitive function; Frail or deconditioned; Weak cough; Aspiration of thick, dense, and/or acidic materials Recommendations/Plan: Swallowing Evaluation Recommendations Swallowing Evaluation Recommendations Recommendations: PO diet PO Diet Recommendation: Dysphagia 1 (Pureed);  Thin liquids (Level 0) Liquid Administration via: Spoon Medication Administration: Crushed with puree Supervision: Full assist for feeding; Full supervision/cueing for swallowing strategies Swallowing strategies  : Slow rate; Small bites/sips; Minimize environmental distractions; effortful swallow; Multiple dry swallows after each bite/sip; Follow solids with liquids Postural changes: Position pt fully upright for meals; Stay upright 30-60 min after meals Oral care recommendations: Oral care BID (2x/day); Staff/trained caregiver  to provide oral care Treatment Plan Treatment Plan Treatment recommendations: Therapy as outlined in treatment plan below Follow-up recommendations: Skilled nursing-short term rehab (<3 hours/day) Functional status assessment: Patient has had a recent decline in their functional status and demonstrates the ability to make significant improvements in function in a reasonable and predictable amount of time. Treatment frequency: Min 2x/week Treatment duration: 1 week Interventions: Aspiration precaution training; Compensatory techniques; Patient/family education; Trials of upgraded texture/liquids; Diet toleration management by SLP Recommendations Recommendations for follow up therapy are one component of a multi-disciplinary discharge planning process, led by the attending physician.  Recommendations may be updated based on patient status, additional functional criteria and insurance authorization. Assessment: Orofacial Exam: Orofacial Exam Oral Cavity: Oral Hygiene: Xerostomia Oral Cavity - Dentition: Poor condition; Missing dentition Orofacial Anatomy: WFL Oral Motor/Sensory Function: Generalized oral weakness Anatomy: Anatomy: WFL Boluses Administered: Boluses Administered Boluses Administered: Thin liquids (Level 0); Mildly thick liquids (Level 2, nectar thick); Moderately thick liquids (Level 3, honey thick); Puree; Solid  Oral Impairment Domain: Oral Impairment Domain Lip Closure: No labial escape Tongue control during bolus hold: Escape to lateral buccal cavity/floor of mouth Bolus preparation/mastication: Slow prolonged chewing/mashing with complete recollection Bolus transport/lingual motion: Delayed initiation of tongue motion (oral holding) Oral residue: Trace residue lining oral structures Location of oral residue : Tongue Initiation of pharyngeal swallow : Valleculae  Pharyngeal Impairment Domain: Pharyngeal Impairment Domain Soft palate elevation: No bolus between soft palate (SP)/pharyngeal wall (PW)  Laryngeal elevation: Complete superior movement of thyroid  cartilage with complete approximation of arytenoids to epiglottic petiole Anterior hyoid excursion: Complete anterior movement Epiglottic movement: Partial inversion Laryngeal vestibule closure: Complete, no air/contrast in laryngeal vestibule Pharyngeal stripping wave : Present - diminished Pharyngeal contraction (A/P view only): N/A Pharyngoesophageal segment opening: Partial distention/partial duration, partial obstruction of flow Tongue base retraction: Narrow column of contrast or air between tongue base and PPW Pharyngeal residue: Collection of residue within or on pharyngeal structures Location of pharyngeal residue: Diffuse (>3 areas)  Esophageal Impairment Domain: Esophageal Impairment Domain Esophageal clearance upright position: Complete clearance, esophageal coating Pill: Pill Consistency administered: -- (n/a) Penetration/Aspiration Scale Score: Penetration/Aspiration Scale Score 1.  Material does not enter airway: Thin liquids (Level 0); Mildly thick liquids (Level 2, nectar thick); Moderately thick liquids (Level 3, honey thick); Puree; Solid Compensatory Strategies: Compensatory Strategies Compensatory strategies: Yes Effortful swallow: Effective Effective Effortful Swallow: Thin liquid (Level 0); Mildly thick liquid (Level 2, nectar thick); Puree Multiple swallows: Effective Effective Multiple Swallows: Thin liquid (Level 0); Mildly thick liquid (Level 2, nectar thick); Puree Liquid wash: Effective Effective Liquid Wash: Thin liquid (Level 0); Mildly thick liquid (Level 2, nectar thick); Puree; Solid   General Information: Caregiver present: No  Diet Prior to this Study: NPO   Temperature : Normal   Respiratory Status: WFL   Supplemental O2: Nasal cannula (3L)   History of Recent Intubation: No  Behavior/Cognition: Alert; Cooperative; Distractible; Requires cueing Self-Feeding Abilities: Dependent for feeding Baseline vocal quality/speech:  Hypophonia/low volume; Dysphonic Volitional Cough: Able to elicit Volitional Swallow: Unable to elicit Exam Limitations: No limitations Goal Planning:  Prognosis for improved oropharyngeal function: Good Barriers to Reach Goals: Cognitive deficits No data recorded Patient/Family Stated Goal: n/a Consulted and agree with results and recommendations: Patient; Pt unable/family or caregiver not available; Nurse; Physician Pain: Pain Assessment Pain Assessment: Faces Faces Pain Scale: 2 Breathing: 0 Negative Vocalization: 1 Facial Expression: 1 Body Language: 0 Consolability: 0 PAINAD Score: 0 Pain Location: generalized with movement Pain Descriptors / Indicators: Discomfort; Grimacing Pain Intervention(s): Limited activity within patient's tolerance; Repositioned End of Session: Start Time:SLP Start Time (ACUTE ONLY): 1014 Stop Time: SLP Stop Time (ACUTE ONLY): 1050 Time Calculation:SLP Time Calculation (min) (ACUTE ONLY): 36 min Charges: SLP Evaluations $ SLP Speech Visit: 1 Visit SLP Evaluations $BSS Swallow: 1 Procedure $MBS Swallow: 1 Procedure SLP visit diagnosis: SLP Visit Diagnosis: Dysphagia, oropharyngeal phase (R13.12) Past Medical History: Past Medical History: Diagnosis Date  Alzheimer's dementia (HCC)   Anxiety   Atrial fibrillation (HCC)   Permanent  Coronary artery disease   Mild nonobstructive 1/12  DM (diabetes mellitus) (HCC)   GERD (gastroesophageal reflux disease)   Gout   Hearing disorder, cochlear   Hiatal hernia   HLD (hyperlipidemia)   HTN (hypertension)   S/P AV nodal ablation   s/p SJM PPM; gen change 02-01-13 by Dr Kelsie  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  Warfarin anticoagulation  Past Surgical History: Past Surgical History: Procedure Laterality Date  CATARACT EXTRACTION Right   COCHLEAR IMPLANT Right   HEMORRHOID BANDING    KNEE ARTHROSCOPY Right 1992  Dr Brenna   PACEMAKER GENERATOR CHANGE Bilateral 02/01/2013  Procedure: PACEMAKER GENERATOR CHANGE;  Surgeon: Lynwood JONETTA Kelsie, MD;   Location: [facility] CATH [facility];  Service: Cardiovascular;  Laterality: Bilateral;  PACEMAKER PLACEMENT  2002; 2014  SJM implanted by Dr Waddell with AV nodal ablation performed; gen change to [employer] SR RF by Dr Kelsie 02-01-13  RETINAL LASER PROCEDURE Left   SLT LASER APPLICATION Left 12/14/2015  Procedure: SLT LASER APPLICATION;  Surgeon: Dow JULIANNA Burke, MD;  Location: [facility];  Service: Ophthalmology;  Laterality: Left; Pat Adams,M.S.,CCC-SLP 04/02/2024, 11:23 AM  ECHOCARDIOGRAM COMPLETE Result Date: 04/01/2024    ECHOCARDIOGRAM REPORT   Patient Name:   Jay Campbell Date of Exam: 04/01/2024 Medical Rec #:  [id number]        Height:       65.0 in Accession #:    [id number]       Weight:       175.7 lb Date of Birth:  1946/01/25        BSA:          1.872 m Patient Age:    78 years         BP:           158/69 mmHg Patient Gender: M                HR:           63 bpm. Exam Location:  [facility] Procedure: 2D Echo, Cardiac Doppler and Color Doppler (Both Spectral and Color            Flow Doppler were utilized during procedure). Indications:    CHF-Acute Diastolic I50.31  History:        Patient has prior history of Echocardiogram examinations, most                 recent 05/09/2019. CAD, Pacemaker, Arrythmias:Atrial Fibrillation                 and Complete heart  block (HCC); Risk Factors:Hypertension,                 Diabetes, Dyslipidemia and Non-Smoker. Alzheimer's dementia                 (HCC) (From Hx).  Sonographer:    Aida Pizza RCS Referring Phys: [id number] TERRY LOISE HURST  Sonographer Comments: Image acquisition challenging due to patient behavioral factors. IMPRESSIONS  1. Left ventricular ejection fraction, by estimation, is 60 to 65%. The left ventricle has normal function. The left ventricle has no regional wall motion abnormalities. There is mild left ventricular hypertrophy. Left ventricular diastolic parameters are indeterminate.  2. Right ventricular systolic function is normal. The right ventricular size  is mildly enlarged. There is normal pulmonary artery systolic pressure.  3. Left atrial size was severely dilated.  4. Right atrial size was severely dilated.  5. The mitral valve is abnormal. Mild to moderate mitral valve regurgitation. No evidence of mitral stenosis. Severe mitral annular calcification.  6. The aortic valve is tricuspid. There is mild calcification of the aortic valve. There is mild thickening of the aortic valve. Aortic valve regurgitation is not visualized. No aortic stenosis is present. Aortic valve mean gradient measures 6.0 mmHg.  7. Aortic dilatation noted. There is borderline dilatation of the aortic root, measuring 39 mm.  8. The inferior vena cava is normal in size with <50% respiratory variability, suggesting right atrial pressure of 8 mmHg. Comparison(s): No significant change from prior study. FINDINGS  Left Ventricle: Left ventricular ejection fraction, by estimation, is 60 to 65%. The left ventricle has normal function. The left ventricle has no regional wall motion abnormalities. Strain was performed and the global longitudinal strain is indeterminate. The left ventricular internal cavity size was normal in size. There is mild left ventricular hypertrophy. Left ventricular diastolic parameters are indeterminate. Right Ventricle: The right ventricular size is mildly enlarged. No increase in right ventricular wall thickness. Right ventricular systolic function is normal. There is normal pulmonary artery systolic pressure. The tricuspid regurgitant velocity is 2.34  m/s, and with an assumed right atrial pressure of 8 mmHg, the estimated right ventricular systolic pressure is 29.9 mmHg. Left Atrium: Left atrial size was severely dilated. Right Atrium: Right atrial size was severely dilated. Pericardium: There is no evidence of pericardial effusion. Mitral Valve: The mitral valve is abnormal. Severe mitral annular calcification. Mild to moderate mitral valve regurgitation. No evidence of  mitral valve stenosis. Tricuspid Valve: The tricuspid valve is normal in structure. Tricuspid valve regurgitation is mild . No evidence of tricuspid stenosis. Aortic Valve: The aortic valve is tricuspid. There is mild calcification of the aortic valve. There is mild thickening of the aortic valve. Aortic valve regurgitation is not visualized. No aortic stenosis is present. Aortic valve mean gradient measures 6.0 mmHg. Aortic valve peak gradient measures 11.3 mmHg. Aortic valve area, by VTI measures 1.90 cm. Pulmonic Valve: The pulmonic valve was normal in structure. Pulmonic valve regurgitation is trivial. No evidence of pulmonic stenosis. Aorta: Aortic dilatation noted. There is borderline dilatation of the aortic root, measuring 39 mm. Venous: The inferior vena cava is normal in size with less than 50% respiratory variability, suggesting right atrial pressure of 8 mmHg. IAS/Shunts: No atrial level shunt detected by color flow Doppler. Additional Comments: 3D was performed not requiring image post processing on an independent workstation and was indeterminate.  LEFT VENTRICLE PLAX 2D LVIDd:         4.80 cm LVIDs:  3.00 cm LV PW:         1.20 cm LV IVS:        1.20 cm LVOT diam:     2.10 cm LV SV:         56 LV SV Index:   30 LVOT Area:     3.46 cm  RIGHT VENTRICLE RV S prime:     14.30 cm/s TAPSE (M-mode): 2.7 cm LEFT ATRIUM              Index        RIGHT ATRIUM           Index LA diam:        5.90 cm  3.15 cm/m   RA Area:     38.35 cm LA Vol (A2C):   138.5 ml 73.98 ml/m  RA Volume:   150.00 ml 80.12 ml/m LA Vol (A4C):   182.0 ml 97.21 ml/m LA Biplane Vol: 165.0 ml 88.13 ml/m  AORTIC VALVE AV Area (Vmax):    1.64 cm AV Area (Vmean):   1.83 cm AV Area (VTI):     1.90 cm AV Vmax:           168.00 cm/s AV Vmean:          106.000 cm/s AV VTI:            0.294 m AV Peak Grad:      11.3 mmHg AV Mean Grad:      6.0 mmHg LVOT Vmax:         79.40 cm/s LVOT Vmean:        56.100 cm/s LVOT VTI:           0.161 m LVOT/AV VTI ratio: 0.55  AORTA Ao Root diam: 3.90 cm MITRAL VALVE                TRICUSPID VALVE MV Area (PHT): 3.27 cm     TR Peak grad:   21.9 mmHg MV Decel Time: 232 msec     TR Vmax:        234.00 cm/s MR Peak grad: 119.7 mmHg MR Mean grad: 75.0 mmHg     SHUNTS MR Vmax:      547.00 cm/s   Systemic VTI:  0.16 m MR Vmean:     401.0 cm/s    Systemic Diam: 2.10 cm MV E velocity: 107.00 cm/s MV A velocity: 33.30 cm/s MV E/A ratio:  3.21 Vishnu Priya Mallipeddi Electronically signed by Diannah Late Mallipeddi Signature Date/Time: 04/01/2024/2:10:59 PM    Final    CT CHEST ABDOMEN PELVIS WO CONTRAST Result Date: 04/01/2024 EXAM: CT CHEST, ABDOMEN AND PELVIS WITHOUT CONTRAST 04/01/2024 01:25:10 AM TECHNIQUE: CT of the chest, abdomen and pelvis was performed without the administration of intravenous contrast. Multiplanar reformatted images are provided for review. Automated exposure control, iterative reconstruction, and/or weight based adjustment of the mA/kV was utilized to reduce the radiation dose to as low as reasonably achievable. COMPARISON: 02/07/2023. CLINICAL HISTORY: Sepsis. FINDINGS: CHEST: MEDIASTINUM AND LYMPH NODES: Cardiomegaly. Pacer wires in the right heart. Coronary artery aortic atherosclerosis. The central airways are clear. No mediastinal, hilar or axillary lymphadenopathy. LUNGS AND PLEURA: Mild to moderate bilateral pleural effusions, right greater than left. Consolidation with air bronchograms in the left lower lobe and posterior lingula concerning for pneumonia. Compressive atelectasis or pneumonia in the right lower lobe. Patchy ground glass airspace opacities also likely related to pneumonia. No pneumothorax. ABDOMEN AND PELVIS: LIVER: Mild nodular contours of the  liver suggest cirrhosis. GALLBLADDER AND BILE DUCTS: High-density material within the gallbladder could reflect small stones or contrast material. No biliary ductal dilatation. SPLEEN: The spleen is mildly enlarged at 13  cm craniocaudal length. PANCREAS: No acute abnormality. ADRENAL GLANDS: No acute abnormality. KIDNEYS, URETERS AND BLADDER: 1.6 cm stone in the upper pole of the right kidney. No hydronephrosis. No perinephric or periureteral stranding. Urinary bladder is unremarkable. GI AND BOWEL: The stomach is decompressed but appears to be thick-walled. This could be related to portal gastropathy or gastritis. Sigmoid diverticulosis. No active diverticulitis. Moderate stool burden throughout the colon. There is no bowel obstruction. REPRODUCTIVE ORGANS: Prostate enlargement. PERITONEUM AND RETROPERITONEUM: Moderate free fluid in the abdomen and pelvis. No free air. VASCULATURE: Aorta is normal in caliber. Aortoiliac catheter sclerosis. ABDOMINAL AND PELVIS LYMPH NODES: No lymphadenopathy. BONES AND SOFT TISSUES: Stable severe compression fracture at L1. Diffuse osteopenia. No focal soft tissue abnormality. IMPRESSION: 1. Left lower lobe and lingular consolidation with patchy bilateral ground-glass opacities, most consistent with multifocal pneumonia. 2. Compressive atelectasis versus additional pneumonia in the right lower lobe. 3. Mild to moderate bilateral pleural effusions, right greater than left. 4. Cirrhotic liver morphology with moderate abdominopelvic ascites and splenomegaly . 5. Gastric wall thickening may be related to portal gastropathy or gastritis . 6. Right upper pole nephrolithiasis . 7. Coronary artery disease, aortic atherosclerosis. 8. Sigmoid diverticulosis. Moderate stool burden in the colon. Electronically signed by: Franky Crease MD 04/01/2024 01:36 AM EST RP Workstation: [id number]   DG Chest Port 1 View Result Date: 04/01/2024 EXAM: 1 VIEW(S) XRAY OF THE CHEST 04/01/2024 01:00:00 AM COMPARISON: Comparison from 03/24/2024. CLINICAL HISTORY: Increased lethargy and shortness of breath. FINDINGS: LUNGS AND PLEURA: Increasing left basilar infiltrate. Small left pleural effusion. No pneumothorax. HEART AND  MEDIASTINUM: The cardiac shadow is prominent. The pacemaker is again seen and stable. BONES AND SOFT TISSUES: No bony abnormality is noted. IMPRESSION: 1. Increasing left basilar infiltrate and small effusion. 2. Prominent cardiac shadow with stable pacemaker. Electronically signed by: Oneil Devonshire MD 04/01/2024 01:04 AM EST RP Workstation: [id number]   DG Chest Port 1 View Result Date: 03/24/2024 CLINICAL DATA:  Respiratory distress. EXAM: PORTABLE CHEST 1 VIEW COMPARISON:  03/03/2024 FINDINGS: The cardio pericardial silhouette is enlarged. Retrocardiac atelectasis or infiltrate noted. Right lung clear. Single lead left-sided permanent pacemaker again noted. Telemetry leads overlie the chest. IMPRESSION: Retrocardiac atelectasis or infiltrate. Electronically Signed   By: Camellia Candle M.D.   On: 03/24/2024 07:22      Subjective: Unresponsive, not in acute distress Discharge Exam: Vitals:   04/16/24 1939 04/17/24 2019  BP: (!) 137/54 (!) 154/47  Pulse: 63 61  Resp: 16 19  Temp: 99.5 F (37.5 C) 99.4 F (37.4 C)  SpO2: 91% 90%    The results of significant diagnostics from this hospitalization (including imaging, microbiology, ancillary and laboratory) are listed below for reference.     Microbiology: No results found for this or any previous visit (from the past 240 hours).   Labs: BNP (last 3 results) No results for input(s): BNP in the last 8760 hours. Basic Metabolic Panel: No results for input(s): NA, K, CL, CO2, GLUCOSE, BUN, CREATININE, CALCIUM, MG, PHOS in the last 168 hours. Liver Function Tests: No results for input(s): AST, ALT, ALKPHOS, BILITOT, PROT, ALBUMIN in the last 168 hours. No results for input(s): LIPASE, AMYLASE in the last 168 hours. No results for input(s): AMMONIA in the last 168 hours. CBC: No results for input(s): WBC, NEUTROABS, HGB,  HCT, MCV, PLT in the last 168 hours. Cardiac Enzymes: No results  for input(s): CKTOTAL, CKMB, CKMBINDEX, TROPONINI in the last 168 hours. BNP: Invalid input(s): POCBNP CBG: No results for input(s): GLUCAP in the last 168 hours. D-Dimer No results for input(s): DDIMER in the last 72 hours. Hgb A1c No results for input(s): HGBA1C in the last 72 hours. Lipid Profile No results for input(s): CHOL, HDL, LDLCALC, TRIG, CHOLHDL, LDLDIRECT in the last 72 hours. Thyroid  function studies No results for input(s): TSH, T4TOTAL, T3FREE, THYROIDAB in the last 72 hours.  Invalid input(s): FREET3 Anemia work up No results for input(s): VITAMINB12, FOLATE, FERRITIN, TIBC, IRON, RETICCTPCT in the last 72 hours. Urinalysis    Component Value Date/Time   COLORURINE COLORLESS (A) 04/01/2024 0658   APPEARANCEUR CLEAR 04/01/2024 0658   LABSPEC 1.008 04/01/2024 0658   PHURINE 5.0 04/01/2024 0658   GLUCOSEU NEGATIVE 04/01/2024 0658   HGBUR MODERATE (A) 04/01/2024 0658   BILIRUBINUR NEGATIVE 04/01/2024 0658   KETONESUR NEGATIVE 04/01/2024 0658   PROTEINUR NEGATIVE 04/01/2024 0658   NITRITE NEGATIVE 04/01/2024 0658   LEUKOCYTESUR NEGATIVE 04/01/2024 0658   Sepsis Labs No results for input(s): WBC in the last 168 hours.  Invalid input(s): PROCALCITONIN, LACTICIDVEN Microbiology No results found for this or any previous visit (from the past 240 hours).   Time coordinating discharge: 35 minutes  SIGNED:   Derryl Duval, MD  [facility] 04/18/2024, 2:46 PM      [1]  Allergies Allergen Reactions   Contrast Media [Iodinated Contrast Media] Other (See Comments)    No reaction listed on MAR   Vioxx [Rofecoxib]     On pt MAR   Shellfish Allergy Itching and Rash   Sulfonamide Derivatives Itching and Rash

## 2024-05-27 ENCOUNTER — Ambulatory Visit

## 2024-08-26 ENCOUNTER — Ambulatory Visit

## 2024-11-25 ENCOUNTER — Ambulatory Visit
